# Patient Record
Sex: Male | Born: 1950 | Race: White | Hispanic: No | Smoking: Former smoker
Health system: Southern US, Community
[De-identification: ages and names within clinical notes are randomized; demographics above are authoritative.]

## PROBLEM LIST (undated history)

## (undated) DIAGNOSIS — I10 Essential (primary) hypertension: Secondary | ICD-10-CM

## (undated) DIAGNOSIS — I350 Nonrheumatic aortic (valve) stenosis: Secondary | ICD-10-CM

## (undated) DIAGNOSIS — I82629 Acute embolism and thrombosis of deep veins of unspecified upper extremity: Secondary | ICD-10-CM

## (undated) DIAGNOSIS — I471 Supraventricular tachycardia, unspecified: Secondary | ICD-10-CM

## (undated) DIAGNOSIS — U071 COVID-19: Secondary | ICD-10-CM

## (undated) DIAGNOSIS — N189 Chronic kidney disease, unspecified: Secondary | ICD-10-CM

## (undated) DIAGNOSIS — F0150 Vascular dementia without behavioral disturbance: Secondary | ICD-10-CM

## (undated) DIAGNOSIS — N400 Enlarged prostate without lower urinary tract symptoms: Secondary | ICD-10-CM

## (undated) DIAGNOSIS — I2510 Atherosclerotic heart disease of native coronary artery without angina pectoris: Secondary | ICD-10-CM

## (undated) DIAGNOSIS — Z972 Presence of dental prosthetic device (complete) (partial): Secondary | ICD-10-CM

## (undated) DIAGNOSIS — E785 Hyperlipidemia, unspecified: Secondary | ICD-10-CM

## (undated) DIAGNOSIS — N1832 Chronic kidney disease, stage 3b: Secondary | ICD-10-CM

## (undated) DIAGNOSIS — M1990 Unspecified osteoarthritis, unspecified site: Secondary | ICD-10-CM

## (undated) DIAGNOSIS — R0600 Dyspnea, unspecified: Secondary | ICD-10-CM

## (undated) DIAGNOSIS — I5020 Unspecified systolic (congestive) heart failure: Secondary | ICD-10-CM

## (undated) DIAGNOSIS — R42 Dizziness and giddiness: Secondary | ICD-10-CM

## (undated) DIAGNOSIS — T7840XA Allergy, unspecified, initial encounter: Secondary | ICD-10-CM

## (undated) DIAGNOSIS — N390 Urinary tract infection, site not specified: Secondary | ICD-10-CM

## (undated) DIAGNOSIS — I509 Heart failure, unspecified: Secondary | ICD-10-CM

## (undated) DIAGNOSIS — I219 Acute myocardial infarction, unspecified: Secondary | ICD-10-CM

## (undated) HISTORY — PX: CORONARY ARTERY BYPASS GRAFT: SHX141

## (undated) HISTORY — DX: Hyperlipidemia, unspecified: E78.5

## (undated) HISTORY — DX: Essential (primary) hypertension: I10

## (undated) HISTORY — DX: Acute myocardial infarction, unspecified: I21.9

## (undated) HISTORY — PX: JOINT REPLACEMENT: SHX530

## (undated) HISTORY — DX: Dyspnea, unspecified: R06.00

## (undated) HISTORY — DX: Allergy, unspecified, initial encounter: T78.40XA

## (undated) HISTORY — DX: Heart failure, unspecified: I50.9

---

## 1999-09-05 ENCOUNTER — Emergency Department (HOSPITAL_COMMUNITY): Admission: EM | Admit: 1999-09-05 | Discharge: 1999-09-05 | Payer: Self-pay | Admitting: Emergency Medicine

## 2002-06-10 DIAGNOSIS — I219 Acute myocardial infarction, unspecified: Secondary | ICD-10-CM

## 2002-06-10 HISTORY — DX: Acute myocardial infarction, unspecified: I21.9

## 2003-02-21 ENCOUNTER — Encounter: Payer: Self-pay | Admitting: Emergency Medicine

## 2003-02-22 ENCOUNTER — Inpatient Hospital Stay (HOSPITAL_COMMUNITY): Admission: EM | Admit: 2003-02-22 | Discharge: 2003-02-25 | Payer: Self-pay | Admitting: Emergency Medicine

## 2003-03-21 ENCOUNTER — Encounter (HOSPITAL_COMMUNITY): Admission: RE | Admit: 2003-03-21 | Discharge: 2003-06-19 | Payer: Self-pay | Admitting: Cardiology

## 2007-06-11 HISTORY — PX: CORONARY ARTERY BYPASS GRAFT: SHX141

## 2007-07-16 ENCOUNTER — Ambulatory Visit: Payer: Self-pay | Admitting: Cardiology

## 2007-07-16 LAB — CONVERTED CEMR LAB
Basophils Absolute: 0 10*3/uL (ref 0.0–0.1)
Chloride: 101 meq/L (ref 96–112)
Creatinine, Ser: 0.9 mg/dL (ref 0.4–1.5)
Eosinophils Relative: 5.1 % — ABNORMAL HIGH (ref 0.0–5.0)
HCT: 45.1 % (ref 39.0–52.0)
INR: 0.9 (ref 0.8–1.0)
MCHC: 34 g/dL (ref 30.0–36.0)
Neutrophils Relative %: 49.6 % (ref 43.0–77.0)
RBC: 5.16 M/uL (ref 4.22–5.81)
RDW: 12.5 % (ref 11.5–14.6)
Sodium: 143 meq/L (ref 135–145)
WBC: 5.2 10*3/uL (ref 4.5–10.5)
aPTT: 29.1 s (ref 21.7–29.8)

## 2007-07-17 ENCOUNTER — Ambulatory Visit: Payer: Self-pay | Admitting: Cardiology

## 2007-07-17 ENCOUNTER — Encounter: Payer: Self-pay | Admitting: Cardiology

## 2007-07-17 ENCOUNTER — Inpatient Hospital Stay (HOSPITAL_BASED_OUTPATIENT_CLINIC_OR_DEPARTMENT_OTHER): Admission: RE | Admit: 2007-07-17 | Discharge: 2007-07-17 | Payer: Self-pay | Admitting: Cardiology

## 2007-07-17 ENCOUNTER — Ambulatory Visit (HOSPITAL_COMMUNITY): Admission: RE | Admit: 2007-07-17 | Discharge: 2007-07-17 | Payer: Self-pay | Admitting: Cardiology

## 2007-07-21 ENCOUNTER — Ambulatory Visit: Payer: Self-pay | Admitting: Surgery

## 2007-07-29 ENCOUNTER — Inpatient Hospital Stay (HOSPITAL_COMMUNITY): Admission: RE | Admit: 2007-07-29 | Discharge: 2007-08-03 | Payer: Self-pay | Admitting: Surgery

## 2007-07-30 ENCOUNTER — Ambulatory Visit: Payer: Self-pay | Admitting: Surgery

## 2007-08-14 ENCOUNTER — Ambulatory Visit (HOSPITAL_COMMUNITY): Admission: RE | Admit: 2007-08-14 | Discharge: 2007-08-14 | Payer: Self-pay | Admitting: Cardiology

## 2007-08-25 ENCOUNTER — Ambulatory Visit: Payer: Self-pay | Admitting: Surgery

## 2007-08-25 ENCOUNTER — Encounter: Admission: RE | Admit: 2007-08-25 | Discharge: 2007-08-25 | Payer: Self-pay | Admitting: Surgery

## 2007-09-02 ENCOUNTER — Ambulatory Visit: Payer: Self-pay | Admitting: Cardiology

## 2007-09-10 ENCOUNTER — Encounter (HOSPITAL_COMMUNITY): Admission: RE | Admit: 2007-09-10 | Discharge: 2007-12-09 | Payer: Self-pay | Admitting: Cardiology

## 2007-10-23 ENCOUNTER — Ambulatory Visit: Payer: Self-pay | Admitting: Cardiology

## 2007-12-09 ENCOUNTER — Ambulatory Visit: Payer: Self-pay | Admitting: Cardiology

## 2008-09-20 ENCOUNTER — Telehealth (INDEPENDENT_AMBULATORY_CARE_PROVIDER_SITE_OTHER): Payer: Self-pay | Admitting: *Deleted

## 2008-11-24 DIAGNOSIS — I5043 Acute on chronic combined systolic (congestive) and diastolic (congestive) heart failure: Secondary | ICD-10-CM | POA: Insufficient documentation

## 2008-11-24 DIAGNOSIS — I10 Essential (primary) hypertension: Secondary | ICD-10-CM | POA: Insufficient documentation

## 2008-11-24 DIAGNOSIS — I509 Heart failure, unspecified: Secondary | ICD-10-CM | POA: Insufficient documentation

## 2008-11-24 DIAGNOSIS — R0602 Shortness of breath: Secondary | ICD-10-CM

## 2008-11-24 DIAGNOSIS — I5042 Chronic combined systolic (congestive) and diastolic (congestive) heart failure: Secondary | ICD-10-CM | POA: Insufficient documentation

## 2008-11-24 HISTORY — DX: Acute on chronic combined systolic (congestive) and diastolic (congestive) heart failure: I50.43

## 2008-11-24 HISTORY — DX: Shortness of breath: R06.02

## 2008-11-25 ENCOUNTER — Ambulatory Visit: Payer: Self-pay | Admitting: Cardiology

## 2009-06-02 ENCOUNTER — Ambulatory Visit: Payer: Self-pay | Admitting: Cardiology

## 2009-06-02 ENCOUNTER — Observation Stay (HOSPITAL_COMMUNITY): Admission: EM | Admit: 2009-06-02 | Discharge: 2009-06-03 | Payer: Self-pay | Admitting: Emergency Medicine

## 2009-06-02 ENCOUNTER — Encounter (INDEPENDENT_AMBULATORY_CARE_PROVIDER_SITE_OTHER): Payer: Self-pay | Admitting: *Deleted

## 2009-06-02 LAB — CONVERTED CEMR LAB
CO2: 25 meq/L
Chloride: 103 meq/L
Creatinine, Ser: 1.54 mg/dL
Potassium: 4.1 meq/L

## 2009-06-03 ENCOUNTER — Encounter (INDEPENDENT_AMBULATORY_CARE_PROVIDER_SITE_OTHER): Payer: Self-pay | Admitting: *Deleted

## 2009-06-03 LAB — CONVERTED CEMR LAB
ALT: 35 units/L
Albumin: 2.9 g/dL
CO2: 24 meq/L
Calcium: 8.1 mg/dL
Chloride: 107 meq/L
Glucose, Bld: 187 mg/dL
Potassium: 3.2 meq/L
Sodium: 138 meq/L
Total Protein: 5 g/dL

## 2009-06-23 ENCOUNTER — Encounter (INDEPENDENT_AMBULATORY_CARE_PROVIDER_SITE_OTHER): Payer: Self-pay | Admitting: *Deleted

## 2009-06-27 ENCOUNTER — Ambulatory Visit: Payer: Self-pay | Admitting: Cardiology

## 2009-06-27 ENCOUNTER — Encounter: Payer: Self-pay | Admitting: Adult Health

## 2009-06-27 DIAGNOSIS — E119 Type 2 diabetes mellitus without complications: Secondary | ICD-10-CM | POA: Insufficient documentation

## 2009-06-27 DIAGNOSIS — E1165 Type 2 diabetes mellitus with hyperglycemia: Secondary | ICD-10-CM | POA: Insufficient documentation

## 2009-06-27 DIAGNOSIS — I25810 Atherosclerosis of coronary artery bypass graft(s) without angina pectoris: Secondary | ICD-10-CM | POA: Insufficient documentation

## 2009-10-12 ENCOUNTER — Telehealth: Payer: Self-pay | Admitting: Cardiology

## 2009-10-18 ENCOUNTER — Encounter: Payer: Self-pay | Admitting: Adult Health

## 2009-11-12 IMAGING — CR DG CHEST 2V
2 series · 2 of 2 positions shown · non-contrast
Comparison: none

CLINICAL DATA: Preadmission film, coronary artery. 
 CHEST - 2 VIEW:

[view not recorded (1 of 2)]
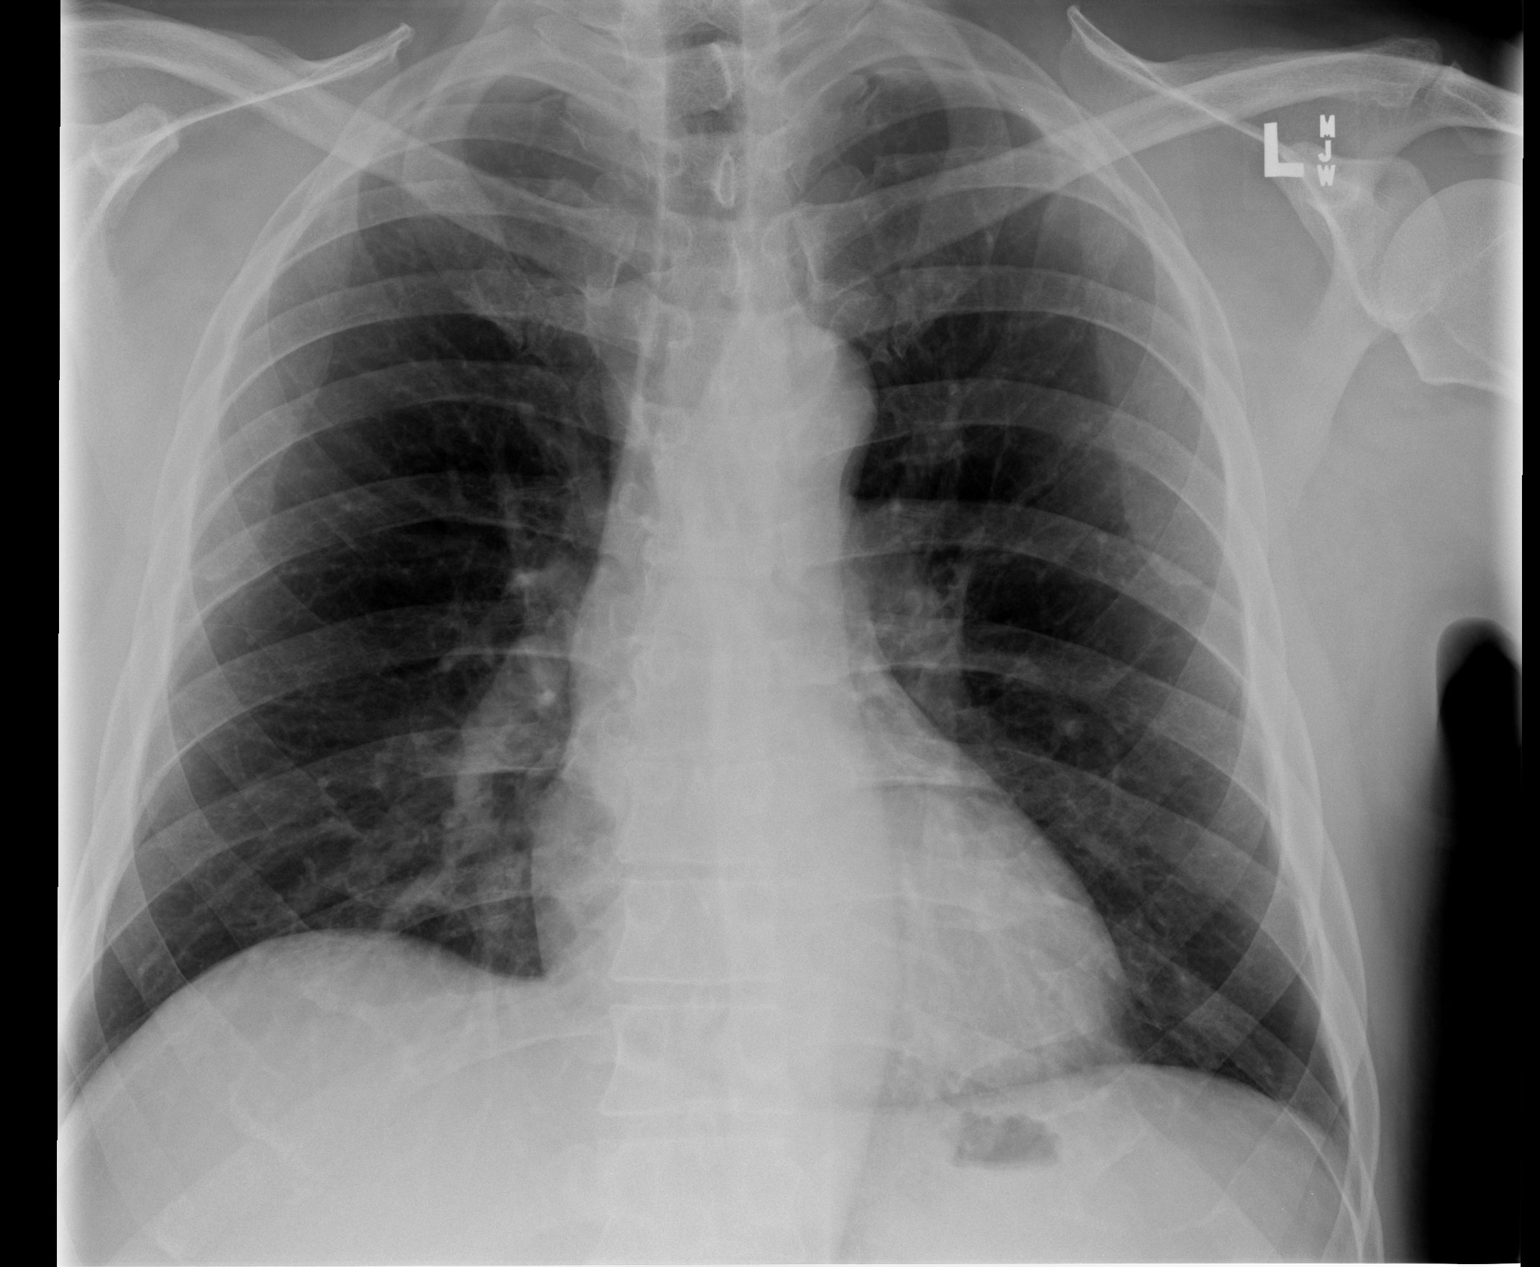

[view not recorded (2 of 2)]
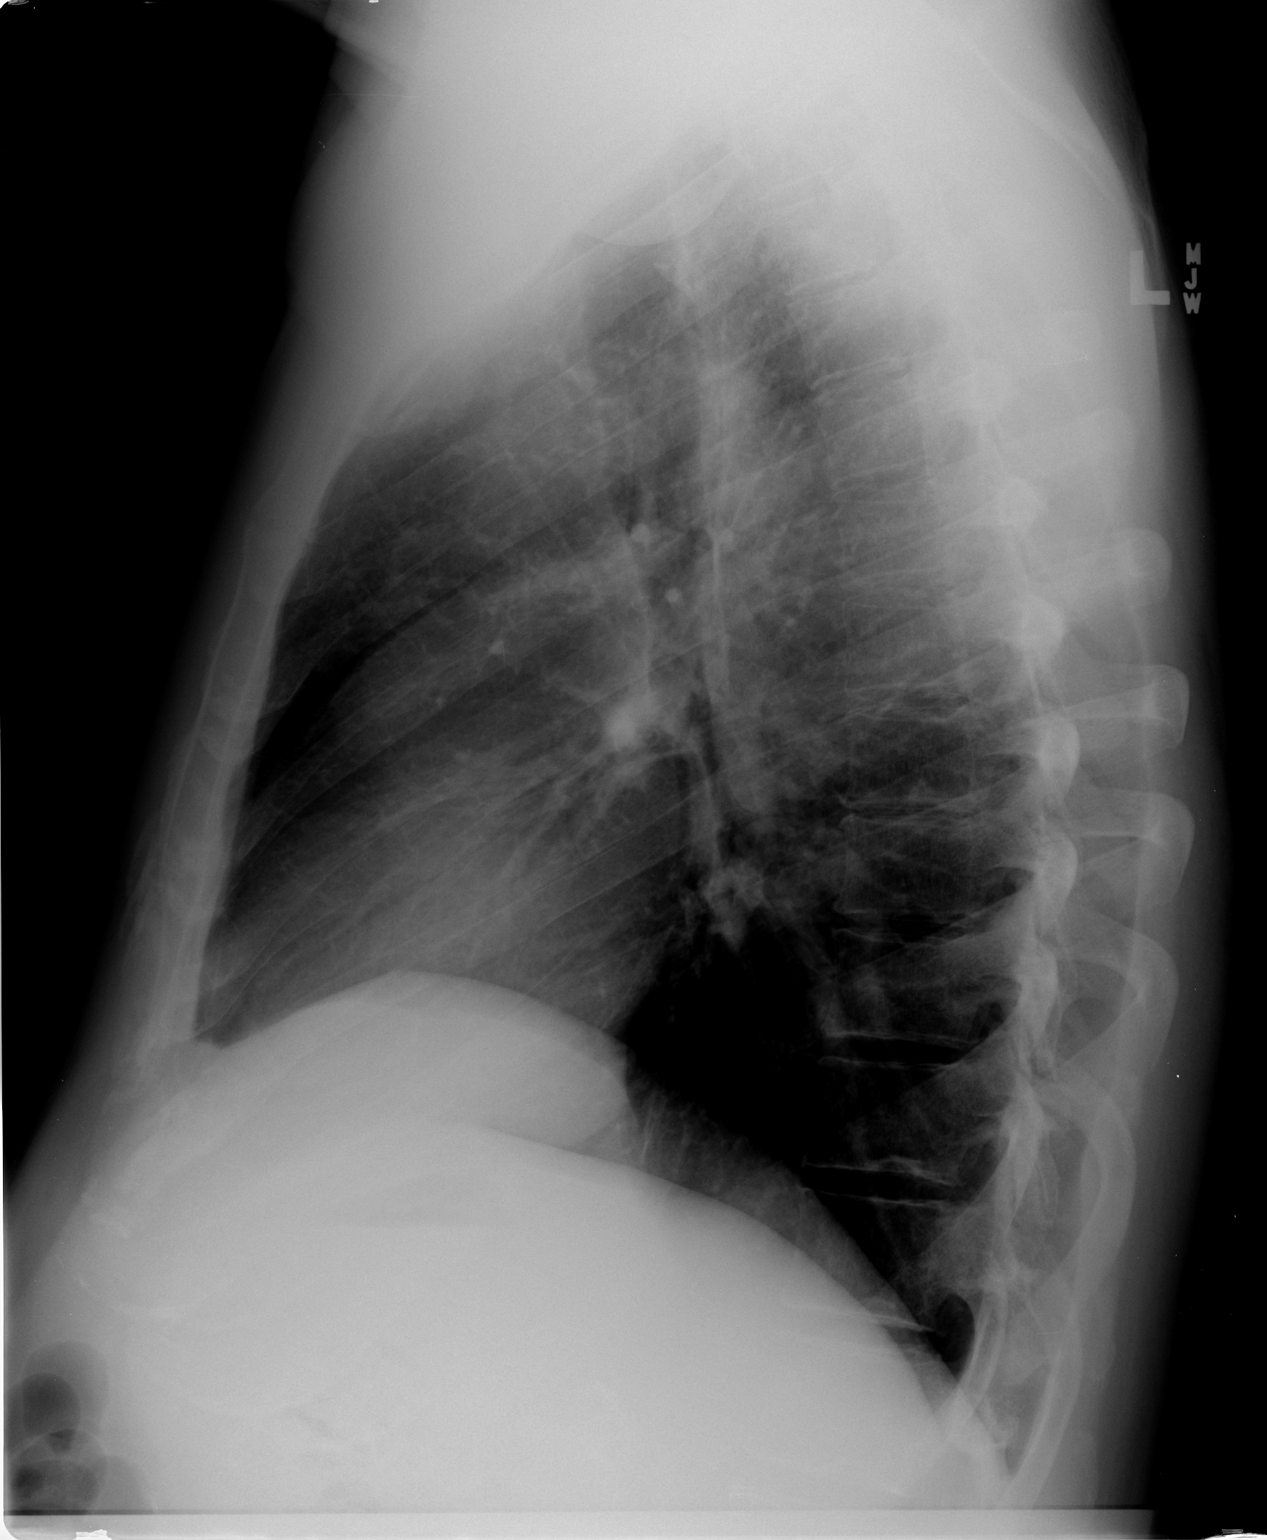

[2 of 2 positions shown; findings below may reference images not displayed]

FINDINGS: The lungs are clear.  Heart is normal.  Mediastinum and hila are negative for adenopathy.
IMPRESSION: Negative for acute cardiac or pulmonary process.

## 2009-11-16 IMAGING — CR DG CHEST 2V
2 series · 2 of 2 positions shown · non-contrast
Comparison: Comparison is made with chest single view 07/30/07.

CLINICAL DATA: 56-year-old male with CABG.
 CHEST - 2 VIEW:

[w chest pa]
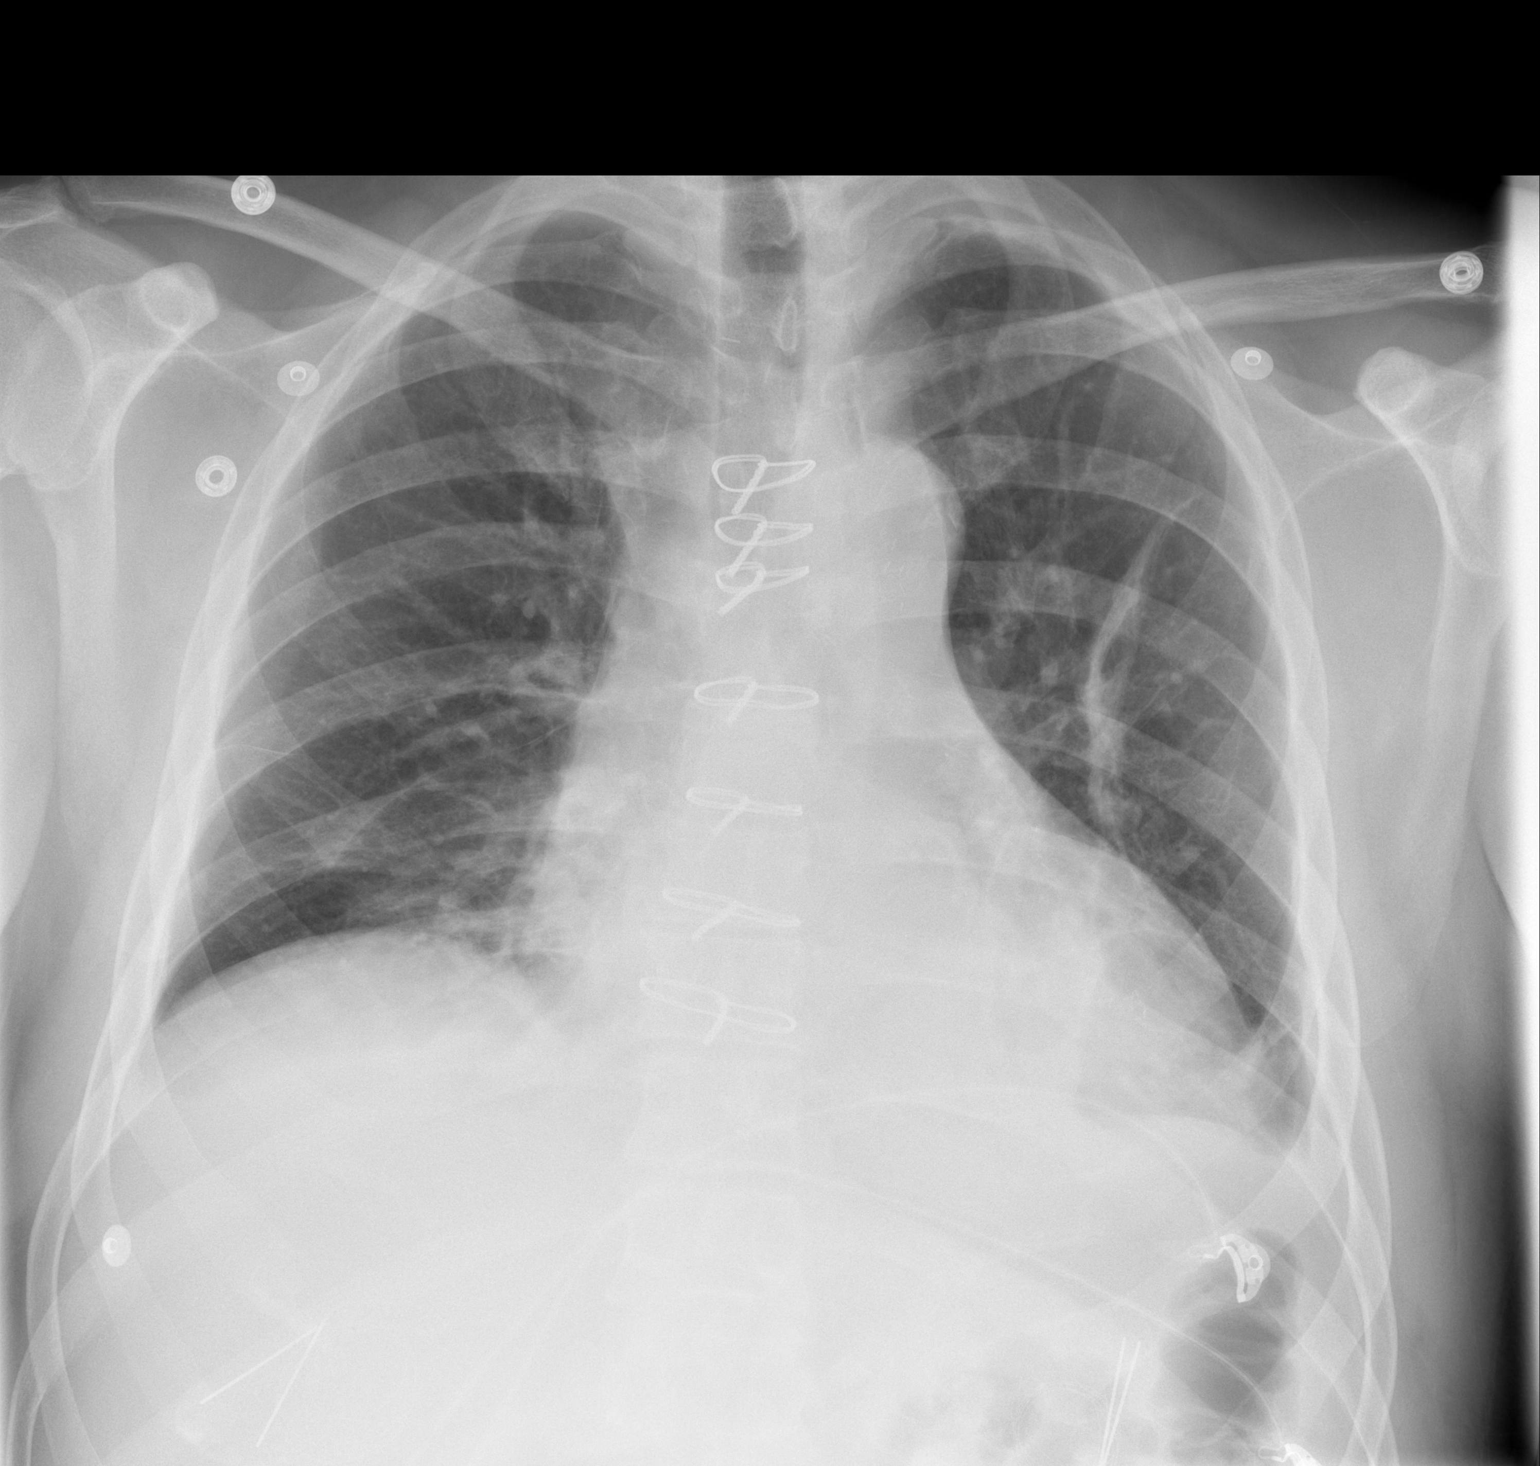

[w chest lat]
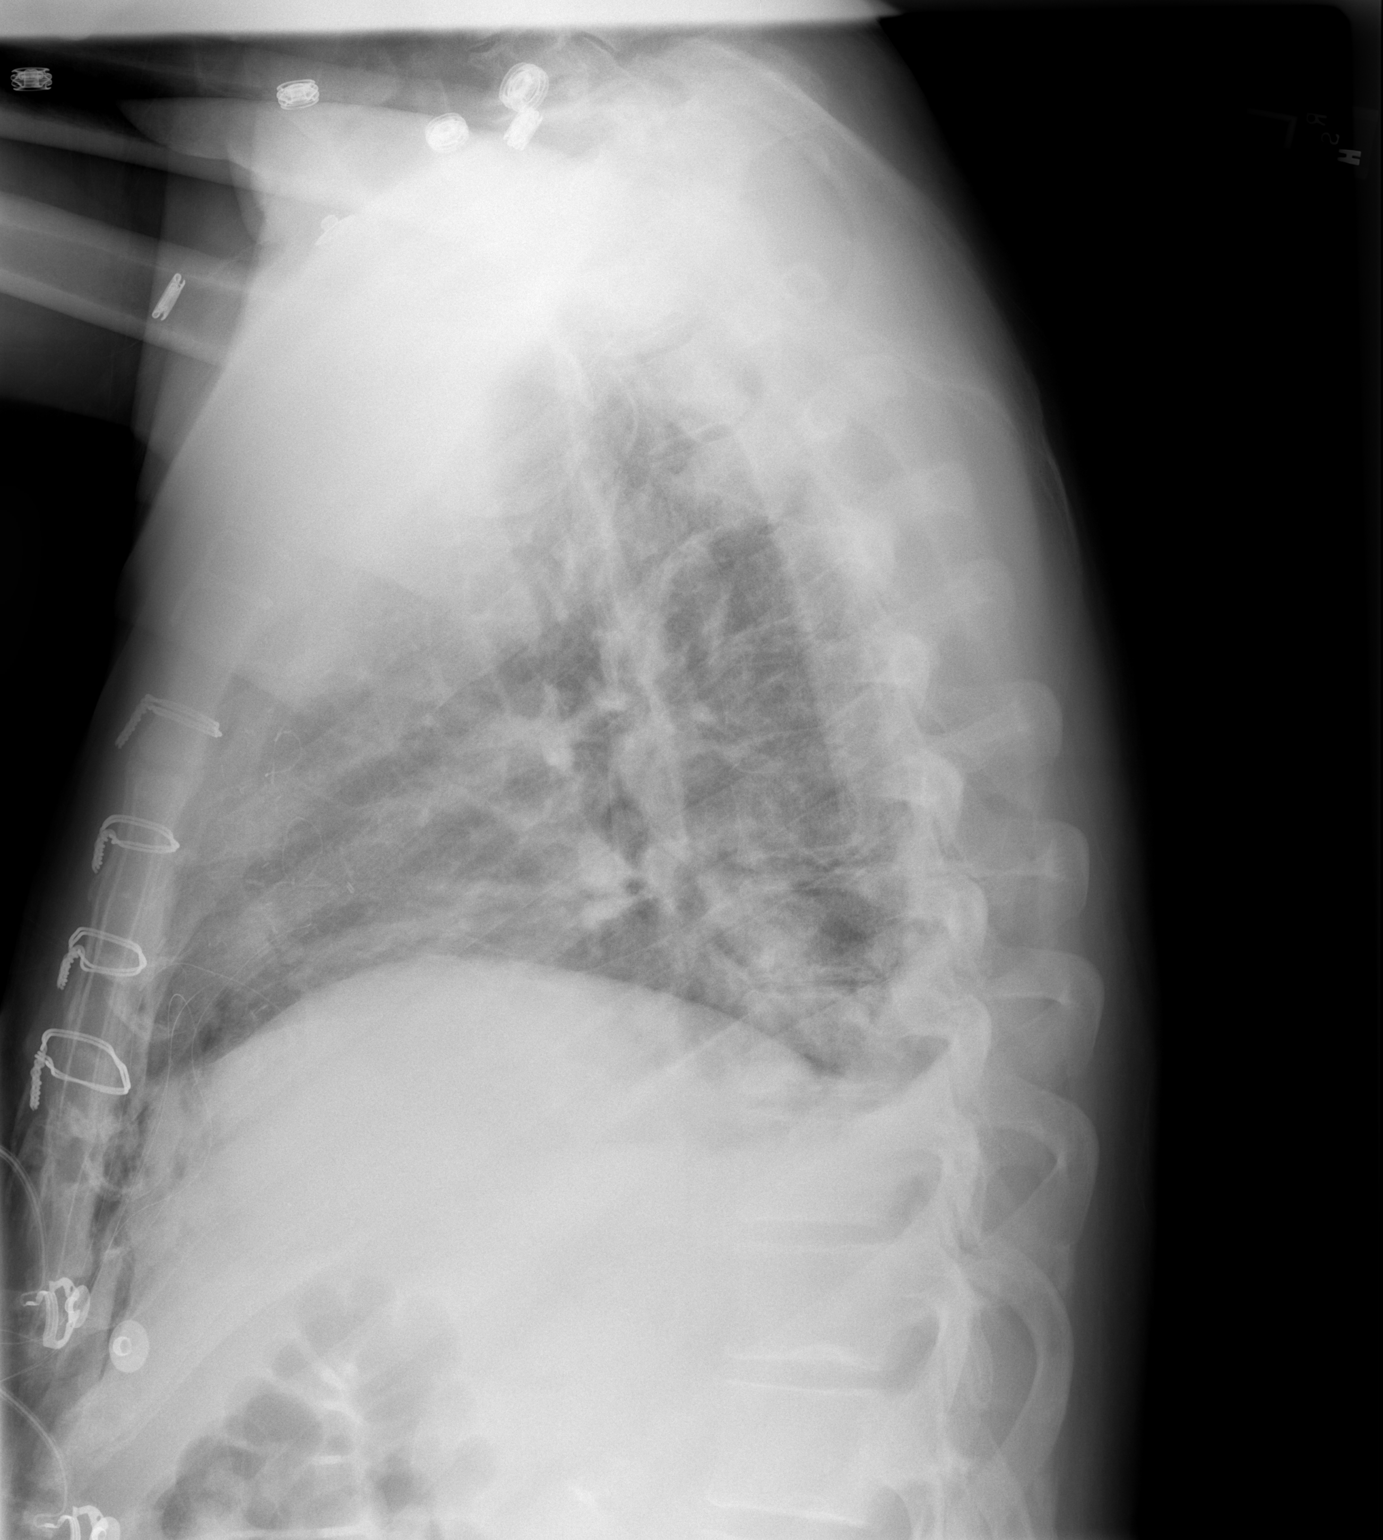

[2 of 2 positions shown; findings below may reference images not displayed]

FINDINGS: Sternotomy wires overlie enlarged cardiac silhouette. Interval removal of left chest tube with no evidence of pneumothorax. There is new subsegmental atelectasis in the left mid lung. Interval retraction of Swan-Ganz catheter and sheath.
IMPRESSION: 1.  Interval removal of left chest tube without pneumothorax.
 2.  New subsegmental atelectasis in the left mid lung. 
 3.  Persistent bibasilar atelectasis, left greater than right.

## 2010-03-28 ENCOUNTER — Ambulatory Visit (HOSPITAL_BASED_OUTPATIENT_CLINIC_OR_DEPARTMENT_OTHER): Admission: RE | Admit: 2010-03-28 | Discharge: 2010-03-28 | Payer: Self-pay | Admitting: Orthopedic Surgery

## 2010-05-07 ENCOUNTER — Encounter (INDEPENDENT_AMBULATORY_CARE_PROVIDER_SITE_OTHER): Payer: Self-pay | Admitting: *Deleted

## 2010-07-10 NOTE — Progress Notes (Signed)
  Phone Note Call from Patient   Caller: Patient Call For: insurance coverage Summary of Call: This pt switching from [employer] to another company.  He requests a letter that addresses his all his cardiac issues past and present.  Please include past cardiac surgical procedures.  Thank you for help this nice gentleman get cheaper better insurance. Initial call taken by: Teressa Lower RN,  Oct 12, 2009 4:54 PM  Follow-up for Phone Call        Ask KL to do since she just saw him in the office. I will gladly sign Friday. Follow-up by: Gaylord Shih, MD, [facility],  Oct 17, 2009 12:50 PM     Appended Document:  Letter written today and [name] will have available to be signed.  KL

## 2010-07-10 NOTE — Assessment & Plan Note (Signed)
Summary: eph post stent/tmj   Visit Type:  Follow-up Primary Provider:  urgent care   History of Present Illness: [name] is a 60 y/o CM who we are seeing on follow-up s/p hospitalization December 2010.  He was admitted with chest pain and was ruled out for MI.  It was felt to be GI in origin.  He has a history of CAD, with CABG 2/09 (LIMA - LAD,SVG to Diag-SVG to OM, SVG to PDA) .  He complained of weakness, mild discomfort in the abdomen and chest.  He was discharged without changes in his medications.  He was found to be hyperglycemic and hypokalemic.  His electrolytes were corrected and he began to feel better. Apparently he had been out of his metformin for several days prior to admission.  Since discharge, he has felt well, takes his medications as directed, continues his metformin as he has been able to get refills.  No complaints of chest pain, SOB, or fatigue.  No GI disturbances as well.  Current Problems (verified): 1)  Hypertension  (ICD-401.9) 2)  CHF  (ICD-428.0) 3)  Dyspnea  (ICD-786.05)  Current Medications (verified): 1)  Cozaar 50 Mg Tabs (Losartan Potassium) .... Take 1 Tablet By Mouth Once A Day 2)  Triamcinolone Acetonide 0.1 % Oint (Triamcinolone Acetonide) .... Apply As Directed 3)  Lipitor 80 Mg Tabs (Atorvastatin Calcium) .Marland Kitchen.. 1 Tab Once Daily 4)  Amlodipine Besylate 10 Mg Tabs (Amlodipine Besylate) .... Take One Tablet By Mouth Daily 5)  Flomax 0.4 Mg Caps (Tamsulosin Hcl) .... Take 1 Tab Daily 6)  Ecotrin 325 Mg Tbec (Aspirin) .... Take 1 Tab Daily 7)  Metformin Hcl 1000 Mg Tabs (Metformin Hcl) .... Bid  Allergies (verified): 1)  ! * Sulfar PMH-FH-SH reviewed-no changes except otherwise noted  Review of Systems       All other systems have been reviewed and are negative unless stated above.   Vital Signs:  Patient profile:   60 year old male Height:      73 inches Weight:      227 pounds BMI:     30.06 Pulse rate:   89 / minute BP sitting:    123 / 75  (right arm)  Vitals Entered By: Dreama Saa, CNA (June 27, 2009 1:40 PM)  Physical Exam  General:  Well developed, well nourished, in no acute distress. Lungs:  Clear bilaterally to auscultation and percussion. Heart:  Non-displaced PMI, chest non-tender; regular rate and rhythm, S1, S2 without murmurs, rubs or gallops. Carotid upstroke normal, no bruit. Normal abdominal aortic size, no bruits. Femorals normal pulses, no bruits. Pedals normal pulses. No edema, no varicosities. Abdomen:  Bowel sounds positive; abdomen soft and non-tender without masses, organomegaly, or hernias noted. No hepatosplenomegaly. Msk:  Back normal, normal gait. Muscle strength and tone normal. Pulses:  pulses normal in all 4 extremities Extremities:  No clubbing or cyanosis. Psych:  Normal affect.   Impression & Recommendations:  Problem # 1:  CORONARY ATHEROSCLEROSIS, ARTERY BYPASS GRAFT (ICD-414.04) He is stable from cardiac standpoint.  No complaints at this time. Recent hospitalization was related to GI symptoms and hyperglycemia.  We will continue to follow him.  He knows to see [place] sooner should he become symptomatic. His updated medication list for this problem includes:    Amlodipine Besylate 10 Mg Tabs (Amlodipine besylate) .Marland Kitchen... Take one tablet by mouth daily    Ecotrin 325 Mg Tbec (Aspirin) .Marland Kitchen... Take 1 tab daily  Problem # 2:  DM (  ICD-250.00) I have referred him to Dr. Romero Belling.  Unfortunately, Dr. Everardo All is not accepting new patients unless referred by a primary care physician.  We will find a physican who is accepting new patients so that he can be established there.  He usually goes to an urgent care facility and will need to be established with primary for ongoing mgt. His updated medication list for this problem includes:    Cozaar 50 Mg Tabs (Losartan potassium) .Marland Kitchen... Take 1 tablet by mouth once a day    Ecotrin 325 Mg Tbec (Aspirin) .Marland Kitchen... Take 1 tab daily    Metformin Hcl  1000 Mg Tabs (Metformin hcl) ..... Bid  Patient Instructions: 1)  Your physician recommends that you schedule a follow-up appointment in: 6 months 2)  Your physician recommends that you continue on your current medications as directed. Please refer to the Current Medication list given to you today.

## 2010-07-10 NOTE — Letter (Signed)
Summary: Appointment - Reminder 2  [facility] [place]. [address], [place] [address]   Phone: [phone]  Fax: [phone]     May 07, 2010 MRN: [id number]   NICOLES SEDLACEK [address] [place], [place]  [address]   Dear Mr. Carl,  Our records indicate that it is time to schedule a follow-up appointment.  Dr.   Daleen Squibb       recommended that you follow up with [place] in    12/2009 PAST DUE        . It is very important that we reach you to schedule this appointment. We look forward to participating in your health care needs. Please contact us at the number listed above at your earliest convenience to schedule your appointment.  If you are unable to make an appointment at this time, give [place] a call so we can update our records.     Sincerely,   Glass blower/designer

## 2010-07-10 NOTE — Miscellaneous (Signed)
Summary: HOSP LABS  Clinical Lists Changes  Observations: Added new observation of CALCIUM: 8.1 mg/dL (54/02/8118 14:78) Added new observation of ALBUMIN: 2.9 g/dL (29/56/2130 86:57) Added new observation of PROTEIN, TOT: 5.0 g/dL (84/69/6295 28:41) Added new observation of SGPT (ALT): 35 units/L (06/03/2009 10:54) Added new observation of SGOT (AST): 30 units/L (06/03/2009 10:54) Added new observation of ALK PHOS: 50 units/L (06/03/2009 10:54) Added new observation of GFR AA: >60 mL/min/1.77m2 (06/03/2009 10:54) Added new observation of GFR: >60 mL/min (06/03/2009 10:54) Added new observation of CREATININE: 1.05 mg/dL (32/44/0102 72:53) Added new observation of BUN: 19 mg/dL (66/44/0347 42:59) Added new observation of BG RANDOM: 187 mg/dL (56/38/7564 33:29) Added new observation of CO2 PLSM/SER: 24 meq/L (06/03/2009 10:54) Added new observation of CL SERUM: 107 meq/L (06/03/2009 10:54) Added new observation of K SERUM: 3.2 meq/L (06/03/2009 10:54) Added new observation of NA: 138 meq/L (06/03/2009 10:54) Added new observation of CALCIUM: 8.2 mg/dL (51/88/4166 06:30) Added new observation of GFR AA: 56 mL/min/1.82m2 (06/02/2009 10:54) Added new observation of GFR: 46 mL/min (06/02/2009 10:54) Added new observation of CREATININE: 1.54 mg/dL (16/06/930 35:57) Added new observation of BUN: 26 mg/dL (32/20/2542 70:62) Added new observation of BG RANDOM: 169 mg/dL (37/62/8315 17:61) Added new observation of CO2 PLSM/SER: 25 meq/L (06/02/2009 10:54) Added new observation of CL SERUM: 103 meq/L (06/02/2009 10:54) Added new observation of K SERUM: 4.1 meq/L (06/02/2009 10:54) Added new observation of NA: 137 meq/L (06/02/2009 10:54)

## 2010-07-10 NOTE — Letter (Signed)
Summary: Insurance Letter  To Whom It May Concern:   This letter is to discuss cardiology diagnosis and treatments of Shane Holloway.  He is a known patient of [facility], and is under treatment for:  1. Chest pain.  The patient ruled out for myocardial infarction by       serial cardiac markers and 12-lead EKG in December of 2010.  2. Hypertension.   3. History of coronary artery disease, status post coronary artery       bypass grafting in February 2009, with alignment to the left       anterior descending artery, saphenous vein graft to the diagonal,       saphenous vein graft to the obtuse marginal, saphenous vein graft       to the posterior descending artery.  Ejection fraction at that time       was 60%.  4. Hyperlipidemia.  He had a follow-up appoitment in January of 2011 and was stable from a cardiac standpoint with normal vital signs, and no cardiac conplaints at that time.  No medication changes were made.  He is to follow-up with [place] in 6 months time.  Please feel free to call or contact our office for more information if necessary.       Joni Reining NP A.A.C.C. [facility]      Valera Castle M.D. Lenise Arena. C.C [facility]  Appended Document: Insurance Letter  Reviewed [name], MD  Appended Document: Insurance Letter letter faxed to [name] office per his instructions

## 2010-08-22 LAB — GLUCOSE, CAPILLARY: Glucose-Capillary: 179 mg/dL — ABNORMAL HIGH (ref 70–99)

## 2010-08-23 LAB — BASIC METABOLIC PANEL
BUN: 15 mg/dL (ref 6–23)
CO2: 29 mEq/L (ref 19–32)
Chloride: 106 mEq/L (ref 96–112)
Creatinine, Ser: 0.85 mg/dL (ref 0.4–1.5)
Potassium: 4.1 mEq/L (ref 3.5–5.1)

## 2010-09-10 LAB — CBC
MCHC: 34.8 g/dL (ref 30.0–36.0)
RBC: 4.9 MIL/uL (ref 4.22–5.81)

## 2010-09-10 LAB — COMPREHENSIVE METABOLIC PANEL
BUN: 19 mg/dL (ref 6–23)
CO2: 24 mEq/L (ref 19–32)
Chloride: 107 mEq/L (ref 96–112)
Creatinine, Ser: 1.05 mg/dL (ref 0.4–1.5)
GFR calc non Af Amer: >60 mL/min (ref >60)
Total Bilirubin: 1.4 mg/dL — ABNORMAL HIGH (ref 0.3–1.2)

## 2010-09-10 LAB — DIFFERENTIAL
Basophils Relative: 0 % (ref 0–1)
Lymphocytes Relative: 7 % — ABNORMAL LOW (ref 12–46)
Monocytes Relative: 7 % (ref 3–12)
Neutro Abs: 3.8 10*3/uL (ref 1.7–7.7)
Neutrophils Relative %: 86 % — ABNORMAL HIGH (ref 43–77)

## 2010-09-10 LAB — BASIC METABOLIC PANEL
BUN: 26 mg/dL — ABNORMAL HIGH (ref 6–23)
Calcium: 8.2 mg/dL — ABNORMAL LOW (ref 8.4–10.5)
GFR calc non Af Amer: 46 mL/min — ABNORMAL LOW (ref >60)
Glucose, Bld: 169 mg/dL — ABNORMAL HIGH (ref 70–99)
Sodium: 137 mEq/L (ref 135–145)

## 2010-09-10 LAB — POCT CARDIAC MARKERS
CKMB, poc: <1 ng/mL — ABNORMAL LOW (ref 1.0–8.0)
CKMB, poc: <1 ng/mL — ABNORMAL LOW (ref 1.0–8.0)
Myoglobin, poc: 85.1 ng/mL (ref 12–200)
Troponin i, poc: <0.05 ng/mL (ref 0.00–0.09)

## 2010-09-10 LAB — CK TOTAL AND CKMB (NOT AT ARMC): CK, MB: 1.9 ng/mL (ref 0.3–4.0)

## 2010-09-10 LAB — GLUCOSE, CAPILLARY: Glucose-Capillary: 225 mg/dL — ABNORMAL HIGH (ref 70–99)

## 2010-09-10 LAB — CARDIAC PANEL(CRET KIN+CKTOT+MB+TROPI): Troponin I: 0.01 ng/mL (ref 0.00–0.06)

## 2010-09-10 LAB — TROPONIN I: Troponin I: 0.02 ng/mL (ref 0.00–0.06)

## 2010-10-23 NOTE — Assessment & Plan Note (Signed)
[facility]                            CARDIOLOGY OFFICE NOTE   Shane, Holloway                    MRN:          [id number]  DATE:07/16/2007                            DOB:          [birth year]    NOTE:  Shane Holloway comes in as an add on today from Dr. Earl Holloway at  [facility].   HISTORY OF PRESENT ILLNESS:  He is 60 years of age, white male who has  known coronary disease.  He was last seen by [place] in 2004 after he  presented with a non-Q-wave infarction.  He had a diagonal stent and a  cutting balloon performed and then he came back for a tight mid right  coronary lesion.   He saw Dr. Geralynn Holloway last September 2004.  Specifically he had a drug  eluting stent in the mid right coronary artery, a drug eluting stent in  the ostium of the diagonal and a cutting balloon of the diagonal.   Over the last week or so he has been having exertional shortness of  breath and chest tightness.  He has had no nocturnal or rest pain.  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   PAST MEDICAL HISTORY:  His risk factors are hypertension,  hyperlipidemia, type 2 diabetes.  He seems to be compliant with his  medications.   CURRENT MEDICATIONS:  1. His current meds are atenolol 100 mg a day.  2. Cyclobenzaprine 10 mg a day.  3. Flomax 0.4 mg a day.  4. HCTZ 12.5 mg a day.  5. Aspirin 325 mg a day.  6. Altace 10 mg a day.  7. Metformin 500 mg a day.   ALLERGIES:  He is intolerant SULFA drugs.   SOCIAL AND FAMILY HISTORY:  See previous HPIs.   REVIEW OF SYSTEMS:  No orthopnea, PND or peripheral edema.  He has had  no melena or hematochezia.  He has had no syncope, presyncope or  palpitations.   PHYSICAL EXAMINATION:  GENERAL:  He is a very pleasant gentleman.  VITAL SIGNS:  His blood pressure 154/101.  His pulse is 100 and regular.  His weight is 232, up 8.  HEENT:  Normocephalic, atraumatic.  PERRL.   Extraocular movements  intact.  Sclerae are clear.  Face symmetry normal.  Carotid upstrokes  were equal bilaterally without bruits, no JVD.  Thyroid is not enlarged.  Trachea is midline.  LUNGS:  Clear.  HEART:  Reveals a nondisplaced PMI.  Normal S1-S2.  ABDOMEN:  Soft, good bowel sounds.  No midline bruit.  No hepatomegaly.  EXTREMITIES:  No clubbing, cyanosis or edema.  Pulses are intact.  NEUROLOGICAL:  Exam is intact.   His electrocardiogram from today shows normal sinus rhythm with ST  segment flattening with T-wave inversion in aVL, V5 and V6.   Blood work that was obtained in October at [facility]  showed a triglyceride count of 721, cholesterol 209, hemoglobin A1c of  7.1%, positive microalbuminuria, blood sugar 132.   ASSESSMENT:  1. History of  coronary artery disease now with exertional angina.      Rule out recurrent obstructive disease.  2. Severe mixed hyperlipidemia.  3. Hypertension, question control.  4. Question of compliance.   PLAN:  Outpatient cardiac catheterization tomorrow.  Indications, risks  and potential benefits have been discussed with the patient.  He agrees  to proceed.  We have given him sublingual nitroglycerin and told him to  take it easy this evening.     Shane C. Daleen Squibb, MD, [facility]  Electronically Signed    TCW/MedQ  DD: 07/16/2007  DT: 07/17/2007  Job #: [id number]

## 2010-10-23 NOTE — Cardiovascular Report (Signed)
Shane Holloway, MCGLASSON             ACCOUNT NO.:  [id number]   MEDICAL RECORD NO.:  [id number]          PATIENT TYPE:  OIB   LOCATION:  NA                           FACILITY:  [facility]   PHYSICIAN:  Rollene Rotunda, MD, FACCDATE OF BIRTH:  05-31-1951   DATE OF PROCEDURE:  DATE OF DISCHARGE:                            CARDIAC CATHETERIZATION   PRIMARY CARE PHYSICIAN:  Brett Canales A. Cleta Alberts, M.D.   CARDIOLOGIST:  Jesse Sans. Wall, MD.   PROCEDURE:  Left heart catheterization/coronary arteriography.   INDICATIONS:  Evaluate patient with chest pain suggestive of unstable  angina.  He has had previous stenting to a diagonal and a right coronary  lesion.  He has had cutting balloon angioplasty to the ostial diagonal.   PROCEDURE NOTE:  Left heart catheterization was performed via the right  femoral artery.  The artery was cannulated using anterior wall puncture.  A number 6-French arterial sheath was inserted via the modified  Seldinger.  A 4-French arterial sheath was inserted via modified  Seldinger technique.  Preformed Judkins and pigtail catheter were  utilized.  The patient tolerated the procedure well and left the lab in  stable condition.   RESULTS:  Hemodynamics:  LV 173/16, AO 178/103.   Coronaries:  Left main was normal.  The LAD had proximal calcification.  There was proximal 60-70% stenosis before the first diagonal.  There was  a long 70-80% stenosis after the diagonal.  There was mid long 70-80%  stenosis.  First diagonal was large with ostial 70-80% stenosis.  There  was mid stent with luminal irregularities.  Circumflex in the AV groove  had luminal irregularities.  There was a mid obtuse marginal which was  large with ostial 70% stenosis.  Posterolateral was small and normal.  The right coronary artery is a dominant vessel.  There were diffuse 25-  30% lesions.  The stent in the mid segment was widely patent.  There was  50% stenosis before the PDA.  The PDA was large with ostial  60-70%  stenosis.  Posterolateral x2 were normal.   Left ventriculogram:  The left ventriculogram in the RAO projection  demonstrated an EF 60% with normal wall motion.   CONCLUSION:  Diffuse coronary artery disease.  Preserved ejection  fraction.   PLAN:  I will refer the patient for CABG.  I have reviewed the results  with Dr. Juanda Chance.  He agrees. The patient will be increased on his  atenolol 100 mg in the morning and 50 in the evening.  He will be given  isosorbide 20 mg t.i.d.  He has sublingual nitroglycerin.  He has been instructed on the use of  this should he have any chest pain.  If he has any significant episodes,  he should come to the emergency room.  We will get him to see the CVT  surgeons as an outpatient.      Rollene Rotunda, MD, [facility]  Electronically Signed     JH/MEDQ  D:  07/17/2007  T:  07/18/2007  Job:  [id number]   cc:   Brett Canales A. Cleta Alberts, M.D.  Thomas C. Wall, MD, [facility]

## 2010-10-23 NOTE — Assessment & Plan Note (Signed)
OFFICE VISIT   Shane Holloway  DOB:  09-06-1950                                        August 25, 2007  CHART #:  [id number]   HISTORY:  Mr. Delatte returned today for followup status post coronary  artery bypass grafting surgery x4 on July 29, 2007.  He has been  feeling well overall and is walking daily without chest pain or  shortness of breath.   PHYSICAL EXAMINATION:  VITAL SIGNS:  Blood pressure 132/82 and his pulse  is 82 and regular.  Respiratory rate is 18 and unlabored.  Oxygen  saturation on room air is 96%.  GENERAL:  He looks well.  CARDIAC:  Exam shows regular rate and rhythm with normal heart sounds.  LUNGS:  His lung exam is clear.  The chest incision is healing well and  the sternum is stable.  EXTREMITIES:  His leg incision is healing well and there is no lower  extremity edema.   Followup chest x-ray shows clear lung fields and no pleural effusion.   MEDICATIONS:  1. Glucophage 1,000 mg daily.  2. Aspirin 325 mg daily.  3. Atenolol 100 mg daily.  4. Cyclobenzaprine 10 mg daily.  5. Flomax 0.4 mg daily.  6. Cozaar 50 mg daily.   Overall, Shane Holloway is recovering well following his surgery.  I  encouraged him to continue walking as much as possible.  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  He has  a followup appointment to see Dr. Daleen Squibb on September 02, 2007 and will  contact me if he develops any problems with his incisions.   Evelene Croon, M.D.  Electronically Signed   BB/MEDQ  D:  08/25/2007  T:  08/26/2007  Job:  [id number]   cc:   Thomas C. Wall, MD, [facility]

## 2010-10-23 NOTE — Assessment & Plan Note (Signed)
[facility]                       [place] CARDIOLOGY OFFICE NOTE   Shane Holloway, Shane Holloway                      MRN:          [id number]  DATE:11/25/2008                            DOB:          [birth year]    Shane Holloway comes in today for followup.   PROBLEM LIST:  1. Coronary artery disease status post coronary artery bypass grafting      in February 2009.  2. Type 2 diabetes.  3. Mixed hyperlipidemia.  4. Hypertension.   He is having no angina or ischemic symptoms.  Denies any orthopnea, PND,  or peripheral edema.  He says his blood sugars up and down.   CURRENT MEDICATIONS:  1. Aspirin 325 mg a day.  2. Flomax 0.4 mg per day.  3. Cozaar 100 mg a day.  4. Lipitor 80 mg nightly.  5. Metformin 1000 mg daily.  6. Amlodipine 5 mg a day.  7. Metoprolol 100 mg b.i.d.  8. He carries sublingual nitroglycerin.   PHYSICAL EXAMINATION:  VITAL SIGNS:  His blood pressure is high at  162/90, he says it runs high at home, his pulse is 66 and regular, his  weight is 226, he is 6 feet 1 inch.  HEENT:  Normal.  NECK:  Supple.  Carotid upstrokes were equal bilaterally without bruits.  No JVD.  Thyroid is not enlarged.  Trachea is midline.  LUNGS:  Clear to auscultation and percussion.  HEART:  Regular rate and rhythm.  No gallop.  ABDOMEN:  Soft.  Good bowel sounds.  No midline bruit.  No hepatomegaly.  EXTREMITIES:  No cyanosis, clubbing, or edema.  Pulses are intact.  NEURO:  Intact.  SKIN:  No ecchymoses.   His electrocardiogram shows sinus rhythm with a first-degree AV block,  otherwise unchanged and normal.   ASSESSMENT AND PLAN:  Shane Holloway blood work needs to be checked.  We  will have him come back for fasting lipids and CMET.  In addition, I  have increased his amlodipine to 10 mg a  day.  I have told him to watch his blood pressure and also make sure  that he is following his blood sugar closely.  I am not sure he does  this.   Assuming he is doing well, I will see him back in February 2011.     Thomas C. Daleen Squibb, MD, [facility]  Electronically Signed    TCW/MedQ  DD: 11/25/2008  DT: 11/26/2008  Job #: [id number]

## 2010-10-23 NOTE — Assessment & Plan Note (Signed)
HEALTHCARE                            CARDIOLOGY OFFICE NOTE   BRIX, BREARLEY                    MRN:          [id number]  DATE:09/02/2007                            DOB:          21-Mar-1951    Mr. Barocio returns today after being discharged from the hospital for  bypass surgery.   I saw him initially on July 16, 2007 as an add-on, because of  exertional shortness of breath and chest tightness.  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   He had had previous coronary disease diagnosed in 2004 with a non-Q-wave  infarction.  He had a previous diagonal stent, a cutting balloon  performed and a mid-right coronary artery lesion in the past.   We recommended cardiac catheterization.  This shows severe three-vessel  coronary disease.  He has normal left ventricular systolic function.   He was admitted on the day of surgery by Dr. Laneta Simmers, who placed left  internal mammary graft to the LAD, a vein graft to a diagonal branch of  the LAD, a vein graft to an obtuse marginal branch of the circumflex,  vein graft to a posterior descending branch to the right.   He has a history of probable an ACE inhibitor-induced cough.  He was  changed from Altace to Cozaar in the past.   He really has neglected his health, not visiting the doctor, but eating  kind of what he wants to eat.  His hemoglobin A1c has been suboptimal.  His triglycerides has been as high as the 700s, in the past.   I think this certainly has gotten his attention, and we had a long talk  today about taking great care of himself.  Remarkably, he somehow is not  taking Lipitor anymore, and he had been on 80 mg in the past.   CURRENT MEDICATIONS:  His current meds are:  1. Metformin 500 mg p.o. daily.  2. Aspirin 325 mg a day.  3. Flomax 0.4 mg a day.  4. Atenolol 100 mg a day.  5. Cozaar 50 mg a day.   He has mild chest wall soreness.  He denies any orthopnea, PND,  tachy  palpitations, pre-syncope, syncope, abdominal pain, melena,  hematochezia, lower extremity edema, or pain.   He did have SVT that was captured with a right bundle morphology post-  op.   PHYSICAL EXAMINATION:  GENERAL:  He is very pleasant.  VITAL SIGNS:  His blood pressure is 142/82.  His pulse is 95 and  regular.  He says he has taken his medicines today.  He weighs 218,  which is actually down 14 pounds.  HEENT:  Normocephalic, atraumatic.  PERRLA.  Extraocular movements  intact.  Sclerae is slightly muddy, facial symmetry is normal.  Dentition satisfactory.  Carotid upstrokes are equal bilaterally without  bruits, no JVD.  Thyroid is not enlarged.  Trachea is midline.  LUNGS:  Clear to auscultation, even the left base.  His sternotomy site  has healed nicely with a very fine pinpoint or pin-line scar.  HEART:  His heart reveals  a nondisplaced PMI, normal S1-S2 without gallop or  rub, murmur.  ABDOMEN:  Soft, good bowel sounds.  No midline bruit.  No hepatomegaly.  EXTREMITIES:  No sinus clubbing or edema.  Pulses are intact.  NEURO:  Exam is intact.  SKIN:  Shows a few ecchymoses.   His electrocardiogram shows sinus rhythm with ST-segment depression in 1  and AVL and V6, nonspecific.  His PR, QRS, QTC are normal.   ASSESSMENT/PLAN:  Mr. Mohamed clearly needs to take much better care of  himself in the future.  His lipids, his blood sugar, and his blood  pressure all are sub-optimally controlled.  His medications clearly need  to be revamped.   I spent about 30 minutes talking him today about various issues going  forward.  I think he heard my message.   PLAN:  1. Increase Cozaar to 100 mg a day.  2. Begin Lipitor 80 mg q.h.s.  3. Tight blood sugar control and avoiding carbs and whites.  4. Return for fasting blood work including lipids, comprehensive      metabolic panel, hemoglobin A1c in 6 weeks.  5. See me back in 8 weeks to discuss findings of his blood work  and      followup on his blood pressure.     Thomas C. Daleen Squibb, MD, [facility]  Electronically Signed    TCW/MedQ  DD: 09/02/2007  DT: 09/03/2007  Job #: [id number]   cc:   [facility]  Brett Canales A. Cleta Alberts, M.D.

## 2010-10-23 NOTE — Consult Note (Signed)
NEW PATIENT CONSULTATION   Shane Holloway, Shane Holloway  DOB:  [birth year]                                        July 21, 2007  CHART #:  [id number]   DATE OF CONSULTATION:  07/21/2007   REFERRING PHYSICIAN:  Jesse Sans. Wall, MD, FACC   REASON FOR CONSULTATION:  Severe three-vessel coronary disease with  unstable angina.   CLINICAL HISTORY:  I was asked by Dr. Daleen Squibb to evaluate Shane Holloway for  consideration of coronary artery bypass graft surgery.  He is a 60-year-  old gentleman with a history of coronary disease status post non Q-wave  myocardial infarction in 2004 followed by stenting of a diagonal and mid  right coronary lesion.  He had a drug-eluting stent in the mid right  coronary artery as well as at the ostium of the diagonal with a cutting  balloon angioplasty of the diagonal.  He did well until the past few  weeks when he has noted return of exertional chest tightness and  shortness of breath.  He had repeat cardiac catheterization on  07/17/2007 which showed severe three-vessel coronary disease.  The left  main was normal.  The LAD had proximal calcification with long 70%  stenosis before the first diagonal branch.  There is a long 70% to 80%  stenosis after the diagonal branch.  There was also a long mid 70% to  80% stenosis before the second diagonal branch.  The first diagonal was  a large vessel with an ostial  70% to 80% stenosis.  The stented area  had luminal irregularities.  The left circumflex had a single marginal  branch that had 70% osteal stenosis.  The right coronary art was a  dominant vessel with diffuse 25% to 30% stenoses.  The stent in the  midportion was widely patent.  There was a 50% stenosis before the  posterior descending and about 60% to 70% osteal stenosis in the  posterior descending.  There are two posterolateral branches that had no  disease in them.  Left ventricular ejection fraction was 60% with normal  wall motion.   The patient was started on isosorbide 20 mg t.i.d. and  said that he has not had any chest pain since then but has had daily  headaches which had been pretty severe.  He had been taking BC powders  without relief.   REVIEW OF SYSTEMS:  GENERAL:  He denies any fever or chills.  He has had  no recent weight changes.  He has had fatigue for several months.  HEENT:  Eyes:  Negative.  ENT:  Negative.  ENDOCRINE:  He has adult  onset diabetes.  He denies hypothyroidism.  CARDIOVASCULAR:  As above, he has had exertional substernal chest  pressure relieved with rest.  He has not had any symptoms at rest.  He  has had exertional dyspnea.  He denies PND and orthopnea.  Denies  peripheral edema.  He had had no palpitations.  Respiratory:  Denies  cough and sputum production.  GI:  He denies nausea or vomiting.  He has  had no melena or bright-red blood per rectum.  GU:  He denies dysuria  and hematuria.  He does have a history of BPH.  VASCULAR:  He denies  claudication or phlebitis.  He has never had a DVT.  NEUROLOGIC:  He  denies any focal weakness or numbness.  He denies dizziness, no syncope.  He has had headaches since being put on isosorbide.  He has never had a  TIA or a stroke.  PSYCHIATRIC:  Negative.  HEMATOLOGICAL:  Negative.   ALLERGIES:  HE IS INTOLERANT TO SULFA DRUGS.   MEDICATIONS:  1. Metformin 1000 mg daily.  2. Altace 10 mg daily.  3. Aspirin 325 mg daily.  4. HCTZ 12.5 mg daily.  5. Atenolol 100 mg q a.m.  6. Cyclobenzaprine 10 mg daily.  7. Flomax 0.4 mg daily.  8. Isosorbide 20 mg t.i.d.   PAST MEDICAL HISTORY:  Significant for hypertension and hyperlipidemia  as well as type 2 diabetes.  He has a history of coronary disease as  mentioned above.  He has had no previous surgery.   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  He  quit smoking in 1989.  He denies alcohol use.   FAMILY HISTORY:  Positive for coronary disease in his mother,  father,  and brothers.   PHYSICAL EXAMINATION:  VITAL SIGNS:  His blood pressure is 169/93, and  his pulse is 60 and regular.  Respiratory rate is 18 and unlabored.  Oxygen saturation on room air is 96%.  GENERAL:  He is a well-developed white male in no distress.  HEENT EXAM:  Shows him to  be normocephalic and atraumatic.  Pupils were  equal and reactive to light and accommodation.  Extraocular muscles are  intact.  His throat is clear.  NECK EXAM:  Shows normal carotid pulses bilaterally.  There are no  bruits.  There is no adenopathy or thyromegaly.  CARDIAC EXAM:  Shows a regular rate and rhythm with normal S1 and S2.  There is no murmur, rub or gallop.  LUNGS:  Clear.  ABDOMINAL EXAM:  Shows active bowel sounds.  His abdomen is soft and  nontender.  There are no palpable masses or organomegaly.  EXTREMITY EXAM:  Shows no peripheral edema.  Pedal pulses are palpable  bilaterally.  SKIN:  Warm and dry.   IMPRESSION:  Shane Holloway has severe three-vessel coronary disease with  exertional anginal symptoms that have been progressing.  I agree that  coronary artery bypass graft surgery is the best treatment to prevent  further ischemia and infarction.  I have discussed the operative  procedure with the patient including alternatives, benefits, and risks  including but not limited to bleeding, blood transfusion, infection,  stroke, myocardial infarction, __________  and death.  He understands  and would like to proceed with surgery.  We will schedule this for  Wednesday, 07/29/2007.   Evelene Croon, M.D.  Electronically Signed   BB/MEDQ  D:  07/21/2007  T:  07/23/2007  Job:  [id number]   cc:   Thomas C. Wall, MD, [facility]

## 2010-10-23 NOTE — Assessment & Plan Note (Signed)
[facility]                            CARDIOLOGY OFFICE NOTE   Shane, Holloway                    MRN:          [id number]  DATE:12/09/2007                            DOB:          [birth year]    [name] returns today for further management of his coronary artery  disease and multiple risk factors.  He is now about 5 months out from  coronary bypass grafting.  Please refer to my previous notes.   He is back doing manual work.  He is having no symptoms of angina or  ischemia.  He is tolerating the heat and humidity pretty well. His  weight had dropped initially from 232 down at 218, but is now back at  222.  His blood sugars are more tightly controlled.   Apparently, he was having a cough and he has now been listed as ACE  intolerant.  He has been placed on Cozaar by Dr. Jens Holloway.   CURRENT MEDICATIONS:  1. Aspirin 325 mg day.  2. Flomax 0.4 mg a day.  3. Cozaar 100 mg a day.  4. Lipitor 80 mg nightly.  5. Metformin 1000 mg daily.  6. Amlodipine 5 mg a day.  7. Metoprolol tartrate 100 mg p.o. b.i.d.  8. Nitroglycerin p.r.n..   PHYSICAL EXAMINATION:  GENERAL:  He looks remarkably good.  VITAL SIGNS:  His blood pressure is 138/79, his pulse is 72 and regular,  his weight is 222.  HEENT:  Unchanged.  Carotid upstrokes were equal bilaterally without  bruits, no JVD.  Thyroid is not enlarged.  Trachea is midline.  LUNGS:  Clear.  HEART:  Reveals regular rate and rhythm.  Sternotomy site is stable.  ABDOMEN:  Soft, good bowel sounds.  No midline bruits.  EXTREMITIES:  Reveal no cyanosis, clubbing, or edema.  Pulses are  intact.   He shows me several areas that look like seborrheic keratoses and they  are itching.  They have come out in the last 3 months.  There is no sign  of infection or inflammation.  He does not have any evidence of a drug  eruption rash.   PLAN:  1. Continue current medications.  2. I gave him some steroid  cream for these areas on his neck and back      p.r.n.  If they do not improve significantly, he was advised to see      Dr. Earl Holloway concerning this.   I will plan on seeing him back a year out from his coronary bypass  surgery, which will be February 2010.     Shane C. Daleen Squibb, MD, [facility]  Electronically Signed    TCW/MedQ  DD: 12/09/2007  DT: 12/10/2007  Job #: [id number]   cc:   Shane Holloway A. Shane Holloway, M.D.

## 2010-10-23 NOTE — Op Note (Signed)
Shane Holloway, Shane Holloway             ACCOUNT NO.:  [id number]   MEDICAL RECORD NO.:  [id number]          PATIENT TYPE:  INP   LOCATION:  2306                         FACILITY:  [facility]   PHYSICIAN:  Evelene Croon, M.D.     DATE OF BIRTH:  1950-11-02   DATE OF PROCEDURE:  07/29/2007  DATE OF DISCHARGE:                               OPERATIVE REPORT   PREOPERATIVE DIAGNOSIS:  Severe three-vessel coronary artery disease.   POSTOPERATIVE DIAGNOSIS:  Severe three-vessel coronary disease   OPERATIVE PROCEDURE:  Median sternotomy, extracorporeal circulation,  coronary bypass graft surgery x4 using a left internal mammary artery  graft to the left anterior descending coronary, with a saphenous vein  graft to the diagonal branch of the LAD, a saphenous vein graft to the  obtuse marginal branch of left circumflex coronary artery, and a  saphenous vein graft to the posterior descending branch of the right  coronary artery.  Endoscopic vein harvesting from the right leg.   SURGEON:  Evelene Croon, M.D.   ASSISTANT:  Rowe Clack, P.A.-C.   ANESTHESIA:  General endotracheal.   CLINICAL HISTORY:  This patient is a 60 year old gentleman with a  history of diabetes, hypertension, family history of heart disease, and  a history of coronary disease who is status post non-Q-wave myocardial  infarction in 2004 followed by stenting of a diagonal and mid right  coronary lesion.  He had a drug-eluting stent in the mid right coronary  artery as well as at the ostium of the diagonal with a cutting balloon  angioplasty of the diagonal.  He did well until the past few weeks when  he has had recurrent exertional chest tightness and shortness of breath.  Repeat catheterization on July 17, 2007 showed severe 3-vessel  disease.  The left main was normal.  The LAD had proximal calcification  with a long 70% stenosis before the first diagonal branch.  There is  also a long 70-80% stenosis after the diagonal  branch.  There was a  focal 70-80% stenosis in the mid vessel just before the second diagonal  branch.  The first diagonal was a large vessel with an ostial 70-80%  stenosis.  The stented area had luminal irregularities.  Left circumflex  had a single marginal branch that had 70% ostial stenosis.  The right  coronary artery was a dominant vessel with diffuse 25-30% stenosis.  The  stent in the midportion was widely patent.  There was also about 50%  stenosis before the posterior descending branch and 60-70% ostial  stenosis in the posterior descending branch.  There were 2  posterolateral branch that had no disease in them.  Left ventricular  ejection fraction was about 60% with normal wall motion.  After review  of the angiogram and examination of the patient, it was felt that  coronary bypass graft surgery was the best treatment to prevent further  ischemia and infarction.  I discussed the operative procedure with the  patient including alternatives, benefits, and risks, including but not  limited to bleeding, blood transfusion, infection, stroke, myocardial  infarction, graft failure, and death.  He understood and agreed to  proceed.   OPERATIVE PROCEDURE:  The patient was taken to the operating room and  placed on the table in the supine position.  After induction of general  endotracheal anesthesia, a Foley catheter was placed in the bladder  using sterile technique.  Then the chest, abdomen and both lower  extremities were prepped and draped in the usual sterile manner.  The  chest was entered through a median sternotomy incision.  The pericardium  was opened in the midline.  Examination of the heart showed good  ventricular contractility.  The ascending aorta had no palpable plaques  in it.   Then the left internal mammary artery was harvested from the chest wall  as a pedicle graft.  This was a  medium caliber vessel with excellent  blood pressure through it.  At the same time  a segment of greater  saphenous vein was harvested from the right leg using endoscopic vein  harvest technique.  This vein was of medium size and good quality.   Then the patient was heparinized and when an adequate activated clotting  time was achieved the distal ascending aorta was cannulated using a 20-  [place] aortic cannula for arterial inflow.  Venous outflow was achieved  using a 2-stage venous cannula for the right atrial appendage.  An  antegrade cardioplegia and vent cannula was inserted in the aortic root.   The patient was placed on cardiopulmonary bypass and the distal  coronaries identified.  The LAD was severely diseased in its proximal  and midportion.  Just beyond the second diagonal branch it was suitable  for grafting.  The more proximal LAD was calcified and not graftable.  The first diagonal branch was heavily diseased in its proximal portion.  In the mid to distal portion it was suitable for grafting.  The second  diagonal was a small nongraftable vessel.  The left circumflex had a  single large marginal branch that had some proximal disease in it.  There was no distal plaque in it.  The right coronary artery was  diffusely diseased.  There was a large posterior descending branch that  had disease in its proximal portion.  The posterolateral branches were  moderate size vessels.  I decided not to graft the posterolateral  branches since they were only compromised by about 50% stenosis in the  distal right coronary artery at the most, and I was concerned that the  competitive flow may cause this segment of the vein graft to shut down.   Then the aorta was crossclamped and 1000 mL of cold blood antegrade  cardioplegia was administered in the aortic root with quick arrest of  the heart.  Systemic hypothermia to 28 degrees centigrade and topical  hypothermic iced saline was used.  An insulating pad was placed in the  pericardium and a septal probe in the  interventricular septum to monitor  myocardial temperature.   The first distal anastomosis was performed to the obtuse marginal  branch.  The internal diameter was 2.5 mm.  Conduit used was a segment  of greater saphenous vein and the anastomosis formed in an end-to-side  manner using a continuous 7-0 Prolene suture.  Flow was noted through  the graft and was excellent.   The second distal anastomosis was performed to the posterior descending  coronary.  The internal diameter was 1.75 mm.  The conduit used was a  second segment of greater saphenous vein and the anastomosis performed  in a end-to-side manner using a continuous 7-0 Prolene suture.  Flow was  noted through the graft and was excellent.  Then another dose of  cardioplegia was given down the vein grafts and the aortic root.   The third distal anastomosis was performed to the diagonal branch.  The  internal diameter of this vessel was about 1.6 mm.  The conduit used was  a third segment of greater saphenous vein and the anastomosis performed  in a end-to-side manner using continuous 7-0 Prolene suture.  Flow was  noted through the graft and was excellent.   The fourth distal anastomosis was performed to the distal LAD.  The  internal diameter was 1.75 mm.  The conduit used was a left internal  mammary graft and this was brought through an opening in the left  pericardium anterior to the phrenic nerve.  This was anastomosed to the  LAD in an end-to-side manner using continuous 8-0 Prolene suture.  The  pedicle was sutured to the epicardium with 6-0 Prolene sutures.  The  patient was rewarmed to 37 degrees centigrade.  With the crossclamp in  place, the proximal anastomoses of the obtuse marginal and posterior  descending vein graft were performed to the aortic root in end-to-side  manner using continuous 6-0 Prolene suture.  The vein graft to the  diagonal branch was too short to reach the aorta comfortably due to the  large  heart and pulmonary artery.  Therefore I anastomosed this to the  proximal portion of the obtuse marginal vein graft in an end-to-side  manner using continuous 7-0 Prolene suture.  Then the clamp was removed  from the mammary pedicle.  There was rapid warming of the ventricular  septum and return of spontaneous ventricular fibrillation.  The  crossclamp was removed with a time of 75 minutes and the patient  spontaneously converted to sinus rhythm.   The proximal and distal anastomoses appeared hemostatic and the lie of  the grafts satisfactory.  Graft markers were placed around the proximal  anastomoses.  Two temporary right ventricular and right atrial pacing  wires were placed and brought out through the skin.   When the patient had rewarmed to 37 degrees centigrade, he was weaned  from cardiopulmonary bypass on no inotropic agents.  Total bypass time  was 92 minutes.  Cardiac function appeared excellent with a cardiac  output of 7 liters per minute.  Protamine was given and the venous and  aortic cannulas were removed without difficulty.  Hemostasis was  achieved.  Three chest tubes were placed with 2 in the posterior  pericardium, 1 in the left pleural space and 1 in the anterior  mediastinum.  The pericardium was loosely reapproximated over the heart.  The sternum was closed with #6 stainless steel wires.  The fascia was  closed with continuous #1 Vicryl suture.  Subcutaneous tissue was closed  with continuous 2-0 Vicryl and skin with a 3-0 Vicryl subcuticular  closure.  The lower extremity vein harvest site was closed in layers in  a similar manner.  The sponge, needle and instrument counts were correct  according to the scrub nurse.  Dry sterile dressings were applied over  the incisions around the chest tubes which were hooked to Pleur-Evac  suction.  The patient remained hemodynamically stable and was  transported to the SICU in guarded but stable condition.      Evelene Croon, M.D.  Electronically Signed     BB/MEDQ  D:  07/29/2007  T:  07/30/2007  Job:  [id number]   cc:   Thomas C. Wall, MD, [facility]

## 2010-10-23 NOTE — Assessment & Plan Note (Signed)
[facility]                            CARDIOLOGY OFFICE NOTE   ARA, MANO                    MRN:          [id number]  DATE:10/23/2007                            DOB:          12-15-1950    HISTORY:  Mr. Loll returns today.  He is about 3 months out from  coronary bypass grafting.  He has done remarkably well.  He has lost  about 20-25 pounds.  He is still on cardiac rehab at [facility]  His  biggest problem is his sugar which he is going to see a diabetologist  next week for.  He cannot remember the name.  In addition, his blood  pressure has been running about 150 systolic at rehab.  He denies  orthopnea, PND or peripheral edema.   CURRENT MEDICATIONS:  1. Aspirin 325 mg a day.  2. Flomax 0.4 mg a day.  3. Atenolol 100 mg a day.  4. Cozaar 100 mg a day which I had increased last visit.  5. Lipitor 80 mg q.h.s.  6. Metformin 1000 mg p.o. daily.   PHYSICAL EXAMINATION:  VITAL SIGNS:  Blood pressure today is 161/91.  I  rechecked it, it was that high or higher.  Pulse is 80 and regular.  Weight is 219.  He has lost significant weight.  HEENT:  Unchanged.  NECK:  Carotid upstrokes are equal bilaterally without bruits, no JVD.  Thyroid is not enlarged.  Neck is supple.  LUNGS:  Clear.  His sternotomy site is stable.  ABDOMEN:  Soft with good bowel sounds.  EXTREMITIES:  Reveal no edema.  Pulses are intact.  NEURO:  Intact.   ASSESSMENT/PLAN:  I am concerned about Mr. Semmel blood pressure.  I  have added amlodipine 5 mg p.o. q.a.m.  He will continue to go to rehab  where he will have his pressure monitored.   FOLLOW UP:  I will see him back in about 6 weeks for followup.     Thomas C. Daleen Squibb, MD, [facility]  Electronically Signed    TCW/MedQ  DD: 10/23/2007  DT: 10/23/2007  Job #: [id number]   cc:   Brett Canales A. Cleta Alberts, M.D.

## 2010-10-23 NOTE — Discharge Summary (Signed)
NAMENORM, WRAY             ACCOUNT NO.:  [id number]   MEDICAL RECORD NO.:  [id number]          PATIENT TYPE:  INP   LOCATION:  2006                         FACILITY:  [facility]   PHYSICIAN:  Evelene Croon, M.D.     DATE OF BIRTH:  06-01-1951   DATE OF ADMISSION:  07/29/2007  DATE OF DISCHARGE:                               DISCHARGE SUMMARY   FINAL DIAGNOSIS:  Severe three-vessel coronary artery disease.   IN-HOSPITAL DIAGNOSES:  1. Postoperative atrial fibrillation.  2. Volume overload postoperatively.   SECONDARY DIAGNOSES:  1. Hypertension.  2. Hyperlipidemia.  3. Type 2 diabetes mellitus.  4. History of coronary artery disease status post non-Q-wave      myocardial infarction in 2004 followed by stenting of diagonal a      mid right coronary lesion.  He had a drug-eluting stent in the mid      right coronary artery as well as ostium of the diagonal with a      cutting balloon angioplasty of the diagonal.   IN-HOSPITAL OPERATIONS AND PROCEDURES:  Coronary artery bypass grafting  x4 using a left internal mammary artery graft to the left anterior  descending coronary, saphenous vein graft to the diagonal branch of the  LAD, saphenous vein graft to the obtuse marginal branch of the left  circumflex coronary artery, saphenous vein graft to the posterior  descending branch of the right coronary artery.  Endoscopic vein harvest  from the right leg was done.   HISTORY AND PHYSICAL AND HOSPITAL COURSE:  The patient is a 60 year old  gentleman with a history of diabetes, hypertension, family history of  heart disease, and history of coronary artery disease who is status post  non-Q-wave myocardial infarction in 2004 followed by stenting of the  diagonal and mid right coronary lesion.  He had a drug-eluting stent in  the mid right coronary artery as well as ostium of the diagonal with a  cutting balloon angioplasty of the diagonal.  The patient did well until  the past few weeks  when he has had recurring exertional chest tightness  and shortness of breath.  Repeat catheterization on July 17, 2007  showed severe three-vessel coronary artery disease.  Dr. Laneta Simmers was  consulted.  Dr. Laneta Simmers saw and evaluated the patient.  He discussed with  the patient undergoing coronary artery bypass grafting.  He discussed  the risks and benefits with the patient.  The patient acknowledged  understanding and agreed to proceed.  Surgery was scheduled for July 29, 2007.  For details of the patient's past medical history and  physical exam, please see dictated H&P.   The patient was taken to the operating room on July 29, 2007 where  he underwent coronary artery bypass grafting x4 using a left internal  mammary artery to the left anterior descending coronary artery,  saphenous vein graft to the diagonal branch of the LAD, saphenous vein  graft to the obtuse marginal branch of the left circumflex coronary  artery, saphenous vein graft to the posterior descending branch of the  right coronary.  Endoscopic vein harvesting from the  right leg was done.  The patient tolerated this procedure and was transferred to the  intensive care unit in stable condition.  Postoperatively, the patient  was noted to be hemodynamically stable.  He was extubated on the evening  of surgery.  Post-extubation, the patient noted to be alert and or x4.  Neuro intact.  Post-extubation, the patient was placed on nasal cannula.  He was sating greater than 90% on room air.  Followup chest x-ray on  postoperative day #1 was stable.  Chest tubes were discontinued in the  normal fashion.  Repeat chest x-ray on the following day remained  stable.  The patient was using his incentive spirometer.  He was able to  be weaned off oxygen, sating greater than 90% on room air.  Postoperatively, from a cardiac standpoint, the patient was noted to be  in normal sinus rhythm following surgery.  He was able to be  started  back on his Altace as well as low-dose Lopressor.  The patient's heart  rate and blood pressure remained stable until postoperative day #4 in  the early morning when he went into rapid atrial fibrillation with a  heart rate in the 160s.  He was ordered to be given amiodarone 400 mg  b.i.d. and to give a.m. dose of Lopressor at that point.  Following  this, the patient was able to convert back to normal sinus rhythm on his  own.  The rest of postop day #4, the patient remained in normal sinus  rhythm.  It was felt that amiodarone could be discontinued, and  Lopressor was increased further.  Plan to continue to monitor the  patient's heart rate during the remainder of his hospital course.   Postoperatively, the patient was noted to be diabetic.  Blood sugars  were followed closely.  He was initially started on Lantus insulin and  sliding scale insulin postoperatively.  Blood sugars were stable, and  the patient was tolerating diet without nausea or vomiting.  He was  switched to metformin p.o. during his hospital course, and Lantus  insulin was discontinued.  Blood sugars remained stable on metformin.   Postoperatively, the patient had significant volume overload.  He was  started on diuretics.  Daily weights were obtained, and edema improved.  The patient was below his baseline weight prior to discharge home.  Postoperatively, the patient was out of bed and ambulating well without  difficulty.  He was tolerating diet well.  No nausea or vomiting noted.   On postoperative day #4, the patient was noted to be afebrile.  Heart  rate and blood pressure stable.  He was sating greater than 90% on room  air.   LABORATORY DATA:  Labs on postoperative day #2, showed a white count  13.4, hemoglobin 13, hematocrit 38.1, platelet count 149.  The patient  was in normal sinus rhythm.  Pulmonary status was stable.  Incisions  were all clean, dry, and intact and healing well.  The patient is   tentatively ready for discharge home in the a.m. of postop day #5,  August 03, 2007.  He remains in normal sinus rhythm.   FOLLOWUP APPOINTMENTS:  Followup appointment has been arranged with Dr.  Laneta Simmers for August 25, 2007 at 12:15 p.m.  The patient will need to obtain  PA and lateral chest x-ray 30 minutes prior to this appointment.  The  patient will need to follow up with Dr. Daleen Squibb in 2 weeks.  He will need  to contact  Dr. Vern Claude office to make these arrangements.   ACTIVITY:  The patient instructed on no driving until released to do so,  no lifting over 10 pounds.  He was told to ambulate 3-4 times per day,  progress as tolerated, and continue his breathing exercises.   WOUND CARE:  The patient was told to shower, washing his incisions using  soap and water.  He is contact the office if he develops any drainage or  opening from any of his incision sites.   DIET:  The patient educated on diet to be low-fat, low-salt.   DISCHARGE MEDICATIONS:  1. Aspirin 325 mg daily.  2. Lopressor 75 mg b.i.d.  3. Altace 10 mg daily.  4. Metformin 1000 mg daily.  5. Oxycodone 5 mg 1-2 tablets q.4-6h. p.r.n.      Theda Belfast, [place]      Evelene Croon, M.D.  Electronically Signed    KMD/MEDQ  D:  08/02/2007  T:  08/03/2007  Job:  [id number]   cc:   Evelene Croon, M.D.  Thomas C. Wall, MD, [facility]

## 2010-10-26 NOTE — Discharge Summary (Signed)
NAMEARTHURO, CANELO                       ACCOUNT NO.:  [id number]   MEDICAL RECORD NO.:  [id number]                   PATIENT TYPE:  INP   LOCATION:  6529                                 FACILITY:  [facility]   PHYSICIAN:  Charlies Constable, M.D.                  DATE OF BIRTH:  June 25, 1950   DATE OF ADMISSION:  02/21/2003  DATE OF DISCHARGE:  02/25/2003                                 DISCHARGE SUMMARY   HISTORY:  Mr. Shippy is a 60 year old male who presented with a two week  history of exertional chest discomfort. He stated that on the day prior to  admission he had a severe episode that lasted approximately one hour  relieved with nitroglycerin, thus he came to the emergency room for  evaluation. His risk factors are positive for diabetes, hypertension,  positive family history with his father and brother having bypass surgery  and his mother has had a myocardial infarction as well as hyperlipidemia.   LABORATORY DATA:  Fasting lipids showed a total cholesterol of 192,  triglycerides 345, HDL 38, LDL 85. CK, total MB, and relative indexes were  negative for myocardial infarction, however troponins were slightly abnormal  at 0.11, 0.11, and 0.28. Admission sodium was 141, potassium 3.4, BUN 16,  creatinine 1.1. Subsequent chemistries showed correction of his hypokalemia  and normal LFTs. Admission PT 12.7, PTT 29. Admission H&H 14.0 and 39.7.  Normal indices. Platelets 172, WBCs 6.5. Subsequent hematologies were all  unremarkable. Admission EKG showed a normal sinus rhythm, nonspecific ST-T  wave changes. Subsequent EKGs were essentially the same.   HOSPITAL COURSE:  Dr. Juanda Chance admitted the patient and placed him on aspirin,  beta blocker, nitroglycerin and Integrilin and felt that he should undergo  cardiac catheterization.  This was performed on the day of admission by Dr.  Samule Ohm. According to Dr. Melinda Crutch progress note he had a 70% mid RCA, 30%  OM1, 50% proximal LAD at the  first diagonal, 30% mid LAD, 99% diagonal 1,  99% mid diagonal 1. EF was 60% without wall motion abnormalities. Utilizing  a cutting balloon Dr. Samule Ohm reduced the 99 proximal diagonal 1 lesion to  approximately 30% and placed a Taxus stent in the mid diagonal reducing it  to 0%. Dr. Samule Ohm noted that he should maintain Plavix therapy for one year.  The sheath was removed, remained on bed rest. Catheterization site was  intact. It was noted in regards to his diabetic history that Glucophage was  held for 48 hours. Cardiac rehab participated in ambulation and education.  The diabetes coordinator also saw the patient. Diabetes coordinator noted  that his triglycerides were high at 345 and felt that he would benefit from  outpatient diabetic education and reinforced compliance with diabetes  protocol. Dr. Juanda Chance felt that the patient had a non-Q-wave myocardial  infarction based on enzymes. His hypokalemia was supplemented. On September  16 he underwent  recatheterization to stent the mid RCA. This was performed  by Dr. Samule Ohm utilizing a Taxus stent without difficulty. Again, Dr. Samule Ohm  noted Plavix for one year, aspirin indefinitely. Post sheath removal and bed  rest he did have a small catheterization site hematoma, however, no bruit.  Medications were adjusted. On September 17 it was felt that the patient  could be discharged home.   DISCHARGE DIAGNOSES:  1. Non-Q-wave myocardial infarction.  2. Hyperlipidemia.  3. Diabetes history as previously.   DISPOSITION:  He received a new prescription for Plavix 75 mg daily for one  year and Altace 5 mg daily. He is advised to increase his aspirin to 325 mg  daily and to increase his Lipitor to 80 mg q.h.s. and to resume his  Glucophage to two times a day after discharge. His Atenolol was decreased to  50 mg daily. He was asked to continue using sublingual nitroglycerin as  needed. He was advised no lifting, driving, sexual activity, or heavy   exertion for two weeks. Maintain a low-salt, low-fat, low-cholesterol diet.  If he has any problems with his  catheterization site he was asked to call. He will need fasting lipids and  LFTs in 6-8 weeks, since his Lipitor was increased. He will follow up with  Dr. Samule Ohm on October 12 at 12 o'clock p.m. At the time of follow-up  consideration should be given to referring the patient to diabetic  outpatient management if this has not been done.      Joellyn Rued, P.A. [facility]                    Charlies Constable, M.D.    EW/MEDQ  D:  04/20/2003  T:  04/20/2003  Job:  [id number]   cc:   Salvadore Farber, M.D.

## 2010-10-26 NOTE — Cardiovascular Report (Signed)
Shane Holloway, Shane Holloway                       ACCOUNT NO.:  [id number]   MEDICAL RECORD NO.:  [id number]                   PATIENT TYPE:  INP   LOCATION:  6529                                 FACILITY:  [facility]   PHYSICIAN:  Salvadore Farber, M.D.             DATE OF BIRTH:  Sep 22, 1950   DATE OF PROCEDURE:  02/24/2003  DATE OF DISCHARGE:  02/25/2003                              CARDIAC CATHETERIZATION   PROCEDURES:  1. Stent to the mid-right coronary artery.  2. Re-look angiography of the diagonal.   INDICATIONS:  Shane Holloway is a 60 year old gentleman who presented with  unstable coronary syndrome on February 22, 2003.  He underwent  catheterization demonstrating serial 99% stenoses in the diagonal branch.  These were treated with cutting balloon angioplasty of the ostium of the  diagonal and drug-eluting stent placement in the midportion of the diagonal.  He has done well since and returns today for planned staged intervention of  the severe stenosis of the mid-RCA.   PROCEDURAL TECHNIQUE:  Informed consent was obtained.  Under 1% lidocaine  local anesthesia, a 6 French sheath was placed in the left femoral artery  using the modified Seldinger technique.  A JL5 catheter was used for  angiography of the left system.  This demonstrated the diagonal lesions to  be stable with less than 30% ostial stenosis and widely patent stent with no  residual stenosis.   Attention was then turned to the RCA.  Anticoagulation was initiated with  heparin and eptifibatide to achieve an ACT of greater than 200 seconds.  A  JR4 guide was advanced over a wire and engaged in the ostium of the RCA.  A  Luge wire was advanced to the distal RCA.  The lesion was directly stented  using a 3.5 x 16 mm Taxus, deploying at 14 atmospheres.  Immediately after  stent deployment, the patient complained of chest discomfort.  There was no  evidence of dissection, and flow to the distal vessel remained TIMI-3.  Careful inspection of the distal vasculature showed no evidence of  embolization save TIMI-2 flow in a long but very small-diameter acute  marginal.  Intracoronary nitroglycerin and verapamil was administered with  some improvement in his discomfort.  The electrocardiographic abnormalities  that occurred with balloon inflation resolved on balloon deflation.  I then  post-dilated the stent using a 3.5 x 15 mm PowerSail at 16 atmospheres.  Final angiograms demonstrated no residual stenosis, TIMI-3 flow to the  distal vasculature save TIMI-2 flow into the acute marginal.   IMPRESSION/RECOMMENDATION:  Successful stenting of the mid-right coronary  artery resulting in no residual stenosis and TIMI-3 flow to the distal  vasculature.  Will plan on continuing eptifibatide for 18 hours.  Plavix  will be continued for a year.  Aspirin will be continued indefinitely.  Salvadore Farber, M.D.    WED/MEDQ  D:  02/24/2003  T:  02/25/2003  Job:  [id number]   cc:   [facility]

## 2011-01-17 ENCOUNTER — Ambulatory Visit: Payer: Self-pay | Admitting: Cardiology

## 2011-01-31 ENCOUNTER — Encounter: Payer: Self-pay | Admitting: Cardiology

## 2011-03-04 LAB — POCT I-STAT 3, ART BLOOD GAS (G3+)
Acid-Base Excess: 1
Bicarbonate: 27.2 — ABNORMAL HIGH
O2 Saturation: 95
Operator id: 284731
Operator id: 3406
TCO2: 24
pCO2 arterial: 39.9
pCO2 arterial: 40.2
pH, Arterial: 7.351
pH, Arterial: 7.363
pH, Arterial: 7.391
pO2, Arterial: 314 — ABNORMAL HIGH
pO2, Arterial: 83

## 2011-03-04 LAB — POCT I-STAT 4, (NA,K, GLUC, HGB,HCT)
Glucose, Bld: 119 — ABNORMAL HIGH
HCT: 27 — ABNORMAL LOW
HCT: 36 — ABNORMAL LOW
HCT: 38 — ABNORMAL LOW
Hemoglobin: 11.2 — ABNORMAL LOW
Hemoglobin: 12.2 — ABNORMAL LOW
Hemoglobin: 12.9 — ABNORMAL LOW
Hemoglobin: 9.2 — ABNORMAL LOW
Operator id: 241461
Operator id: 3406
Potassium: 3.5
Potassium: 4.1
Potassium: 4.8
Sodium: 136
Sodium: 139
Sodium: 139
Sodium: 140

## 2011-03-04 LAB — URINE MICROSCOPIC-ADD ON

## 2011-03-04 LAB — COMPREHENSIVE METABOLIC PANEL
ALT: 57 — ABNORMAL HIGH
Alkaline Phosphatase: 60
BUN: 14
CO2: 22
Chloride: 104
GFR calc non Af Amer: >60
Glucose, Bld: 132 — ABNORMAL HIGH
Potassium: 4
Total Bilirubin: 1.2
Total Protein: 6.5

## 2011-03-04 LAB — CBC
HCT: 37.9 — ABNORMAL LOW
HCT: 38.1 — ABNORMAL LOW
HCT: 43.6
Hemoglobin: 13
Hemoglobin: 13.2
Hemoglobin: 15.4
MCHC: 34.2
MCHC: 34.7
MCHC: 34.8
Platelets: 130 — ABNORMAL LOW
Platelets: 153
RBC: 3.99 — ABNORMAL LOW
RBC: 4.43
RBC: 5.13
RDW: 13.4
RDW: 13.5
RDW: 13.7
WBC: 8.2

## 2011-03-04 LAB — BLOOD GAS, ARTERIAL
Bicarbonate: 24.8 — ABNORMAL HIGH
Patient temperature: 98.6
TCO2: 26
pH, Arterial: 7.423
pO2, Arterial: 86.5

## 2011-03-04 LAB — I-STAT EC8
BUN: 12
Bicarbonate: 22.3
Glucose, Bld: 199 — ABNORMAL HIGH
Hemoglobin: 11.2 — ABNORMAL LOW
Sodium: 136
TCO2: 23
pCO2 arterial: 37.7
pH, Arterial: 7.38

## 2011-03-04 LAB — URINALYSIS, ROUTINE W REFLEX MICROSCOPIC
Glucose, UA: 250 — AB
Hgb urine dipstick: NEGATIVE
Ketones, ur: NEGATIVE
Specific Gravity, Urine: 1.03
pH: 5.5

## 2011-03-04 LAB — BASIC METABOLIC PANEL
BUN: 12
CO2: 30
Calcium: 8.6
Chloride: 106
GFR calc non Af Amer: >60
Glucose, Bld: 138 — ABNORMAL HIGH
Glucose, Bld: 168 — ABNORMAL HIGH
Potassium: 3.8
Potassium: 3.9
Sodium: 138
Sodium: 140

## 2011-03-04 LAB — PROTIME-INR
INR: 1.3
Prothrombin Time: 16.5 — ABNORMAL HIGH

## 2011-03-04 LAB — CREATININE, SERUM
Creatinine, Ser: 0.89
GFR calc Af Amer: >60
GFR calc non Af Amer: >60

## 2011-03-04 LAB — HEMOGLOBIN A1C
Hgb A1c MFr Bld: 7.2 — ABNORMAL HIGH
Mean Plasma Glucose: 179

## 2011-03-04 LAB — PLATELET COUNT: Platelets: 141 — ABNORMAL LOW

## 2011-03-04 LAB — TYPE AND SCREEN: Antibody Screen: NEGATIVE

## 2011-05-06 ENCOUNTER — Encounter: Payer: Self-pay | Admitting: Cardiology

## 2011-08-10 ENCOUNTER — Ambulatory Visit: Payer: Self-pay | Admitting: Family Medicine

## 2011-08-10 DIAGNOSIS — I10 Essential (primary) hypertension: Secondary | ICD-10-CM

## 2011-08-10 DIAGNOSIS — N390 Urinary tract infection, site not specified: Secondary | ICD-10-CM

## 2011-08-10 DIAGNOSIS — E782 Mixed hyperlipidemia: Secondary | ICD-10-CM

## 2011-08-10 DIAGNOSIS — IMO0001 Reserved for inherently not codable concepts without codable children: Secondary | ICD-10-CM

## 2011-08-10 DIAGNOSIS — E785 Hyperlipidemia, unspecified: Secondary | ICD-10-CM

## 2011-08-10 DIAGNOSIS — R42 Dizziness and giddiness: Secondary | ICD-10-CM

## 2011-08-10 LAB — POCT UA - MICROSCOPIC ONLY
Casts, Ur, LPF, POC: NEGATIVE
Crystals, Ur, HPF, POC: NEGATIVE
Mucus, UA: NEGATIVE
Yeast, UA: NEGATIVE

## 2011-08-10 LAB — POCT CBC
Granulocyte percent: 59.3 %G (ref 37–80)
HCT, POC: 42.3 % — AB (ref 43.5–53.7)
Hemoglobin: 14.1 g/dL (ref 14.1–18.1)
Lymph, poc: 1.7 (ref 0.6–3.4)
MCH, POC: 28.7 pg (ref 27–31.2)
MCHC: 33.3 g/dL (ref 31.8–35.4)
MCV: 85.9 fL (ref 80–97)
MID (cbc): 0.2 (ref 0–0.9)
MPV: 8.9 fL (ref 0–99.8)
POC Granulocyte: 2.8 (ref 2–6.9)
POC LYMPH PERCENT: 35.9 %L (ref 10–50)
POC MID %: 4.9 %M (ref 0–12)
Platelet Count, POC: 202 10*3/uL (ref 142–424)
RBC: 4.92 M/uL (ref 4.69–6.13)
RDW, POC: 14.6 %
WBC: 4.7 10*3/uL (ref 4.6–10.2)

## 2011-08-10 LAB — COMPREHENSIVE METABOLIC PANEL
ALT: 34 U/L (ref 0–53)
AST: 30 U/L (ref 0–37)
Albumin: 4.7 g/dL (ref 3.5–5.2)
Alkaline Phosphatase: 68 U/L (ref 39–117)
BUN: 17 mg/dL (ref 6–23)
CO2: 25 mEq/L (ref 19–32)
Calcium: 9.8 mg/dL (ref 8.4–10.5)
Chloride: 104 mEq/L (ref 96–112)
Creat: 0.8 mg/dL (ref 0.50–1.35)
Glucose, Bld: 123 mg/dL — ABNORMAL HIGH (ref 70–99)
Potassium: 3.9 mEq/L (ref 3.5–5.3)
Sodium: 140 mEq/L (ref 135–145)
Total Bilirubin: 1.1 mg/dL (ref 0.3–1.2)
Total Protein: 6.8 g/dL (ref 6.0–8.3)

## 2011-08-10 LAB — POCT URINALYSIS DIPSTICK
Blood, UA: NEGATIVE
Protein, UA: 30
Spec Grav, UA: 1.02
Urobilinogen, UA: 0.2

## 2011-08-10 LAB — LIPID PANEL
Cholesterol: 178 mg/dL (ref 0–200)
HDL: 35 mg/dL — ABNORMAL LOW (ref >39)
LDL Cholesterol: 117 mg/dL — ABNORMAL HIGH (ref 0–99)
Total CHOL/HDL Ratio: 5.1 Ratio
Triglycerides: 130 mg/dL (ref <150)
VLDL: 26 mg/dL (ref 0–40)

## 2011-08-10 LAB — POCT GLYCOSYLATED HEMOGLOBIN (HGB A1C): Hemoglobin A1C: 7.3

## 2011-08-10 LAB — GLUCOSE, POCT (MANUAL RESULT ENTRY): POC Glucose: 121

## 2011-08-10 MED ORDER — CIPROFLOXACIN HCL 500 MG PO TABS: 500.0000 mg | ORAL_TABLET | Freq: Two times a day (BID) | ORAL | Status: AC

## 2011-08-10 NOTE — Patient Instructions (Signed)

## 2011-08-10 NOTE — Progress Notes (Signed)
Subjective: Patient has not been feeling well the past few days. He's had problems with dizziness, especially with position changes. When he gets up from bed and goes across a room he is staggering. He notices a little room spinning sensation when he lays down and looks at the clock. The dizziness is not all the time but it's mostly with position change. He does not usually check his sugars on a regular basis, but he has last few days and got very high readings around 300s. He watched his diet yesterday, he came in without eating this morning in order to get checked. Other than arthritic pains, he does not have any other major complaints of pain. Minimal headache. Cardiovascular unremarkable. Respiratory unremarkable. GI unremarkable. GU [place]. Musculoskeletal has bad knee and aches here and there. He is a little unsteady because of the knee. 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  Objective: Alert oriented white male in no acute distress. Eyes PERRLA. TMs normal though he has some wax buildup in both canals. Throat was clear. Neck supple without nodes or thyromegaly. No carotid bruits. Chest is clear to auscultation. Heart regular without murmurs gallops arrhythmias. Motor strength is good with normal gait except for a level bit of irregularity due to his arthritic knee. Romberg negative. Heel toe adequate.  Assessment: Diabetes mellitus, poorly controlled Dizziness Arthritis  Current Outpatient Prescriptions on File Prior to Visit  Medication Sig Dispense Refill  . amLODipine (NORVASC) 10 MG tablet Take 10 mg by mouth daily.        Marland Kitchen aspirin 325 MG EC tablet Take 325 mg by mouth daily.        Marland Kitchen atorvastatin (LIPITOR) 80 MG tablet Take 80 mg by mouth daily.        . metFORMIN (GLUCOPHAGE) 1000 MG tablet Take 1,000 mg by mouth 2 (two) times daily.        . Tamsulosin HCl (FLOMAX) 0.4 MG CAPS Take 0.4 mg by mouth daily.        Marland Kitchen losartan  (COZAAR) 50 MG tablet Take 50 mg by mouth daily.        Marland Kitchen triamcinolone ointment (KENALOG) 0.1 % Apply topically as directed.           Plan: Check labs and decide treatment change accordingly.  Results for orders placed in visit on 08/10/11  POCT GLYCOSYLATED HEMOGLOBIN (HGB A1C)      Component Value Range   Hemoglobin A1C 7.3    GLUCOSE, POCT (MANUAL RESULT ENTRY)      Component Value Range   POC Glucose 121    POCT URINALYSIS DIPSTICK      Component Value Range   Color, UA yellow     Clarity, UA cloudy     Glucose, UA neg     Bilirubin, UA neg     Ketones, UA neg     Spec Grav, UA 1.020     Blood, UA neg     pH, UA 5.5     Protein, UA 30     Urobilinogen, UA 0.2     Nitrite, UA neg     Leukocytes, UA moderate (2+)    POCT CBC      Component Value Range   WBC 4.7  4.6 - 10.2 (K/uL)   Lymph, poc 1.7  0.6 - 3.4    POC LYMPH PERCENT 35.9  10 - 50 (%L)   MID (cbc) 0.2  0 - 0.9    POC MID % 4.9  0 - 12 (%M)   POC Granulocyte 2.8  2 - 6.9    Granulocyte percent 59.3  37 - 80 (%G)   RBC 4.92  4.69 - 6.13 (M/uL)   Hemoglobin 14.1  14.1 - 18.1 (g/dL)   HCT, POC 16.1 (*) 09.6 - 53.7 (%)   MCV 85.9  80 - 97 (fL)   MCH, POC 28.7  27 - 31.2 (pg)   MCHC 33.3  31.8 - 35.4 (g/dL)   RDW, POC 04.5     Platelet Count, POC 202  142 - 424 (K/uL)   MPV 8.9  0 - 99.8 (fL)  POCT UA - MICROSCOPIC ONLY      Component Value Range   WBC, Ur, HPF, POC tntc     RBC, urine, microscopic 0-1     Bacteria, U Microscopic large     Mucus, UA neg     Epithelial cells, urine per micros 0-1     Crystals, Ur, HPF, POC neg     Casts, Ur, LPF, POC neg     Yeast, UA neg

## 2011-08-12 ENCOUNTER — Encounter: Payer: Self-pay | Admitting: Family Medicine

## 2011-08-12 LAB — URINE CULTURE: Colony Count: >=100000

## 2011-08-19 ENCOUNTER — Other Ambulatory Visit: Payer: Self-pay | Admitting: Family Medicine

## 2011-08-27 ENCOUNTER — Ambulatory Visit: Payer: Self-pay | Admitting: Family Medicine

## 2011-08-27 VITALS — BP 128/75 | HR 86 | Temp 97.7°F | Resp 16 | Ht 72.0 in | Wt 220.0 lb

## 2011-08-27 DIAGNOSIS — M5410 Radiculopathy, site unspecified: Secondary | ICD-10-CM

## 2011-08-27 DIAGNOSIS — M549 Dorsalgia, unspecified: Secondary | ICD-10-CM

## 2011-08-27 DIAGNOSIS — IMO0002 Reserved for concepts with insufficient information to code with codable children: Secondary | ICD-10-CM

## 2011-08-27 MED ORDER — MELOXICAM 7.5 MG PO TABS: 7.5000 mg | ORAL_TABLET | Freq: Every day | ORAL | Status: AC

## 2011-08-27 MED ORDER — HYDROCODONE-ACETAMINOPHEN 5-325 MG PO TABS: 1.0000 | ORAL_TABLET | Freq: Four times a day (QID) | ORAL | Status: AC | PRN

## 2011-08-27 NOTE — Progress Notes (Signed)
  Subjective:    Patient ID: Tommas Olp, male    DOB: 1950/07/15, 61 y.o.   MRN: [id number]  HPI This patient presents for evaluation of low back pain x 1 week.  No trauma or injury.  Has been seeing his chiropractor, but has been experiencing worsening pain. The chiropractor is making arrangements for an MRI to evaluate for a disc injury.  He c/o pain that prevents him from getting comfortable in any position and prevents him from sleeping.  No loss of bowel or bladder control.  No numbness or tingling.  Pain radiates through the left hip and thigh.  No weakness. Ibuprofen and Aleve without benefit.  Has DJD of the left knee, s/p arthroscopy.  Has been told he needs a TKR, but without insurance, he's unable to afford it.   Medical hx includes ASCVD, DM type 2, HTN, hyperlipidemia.  Review of Systems As above.      Objective:   Physical Exam  Vital signs noted. Well-developed, well nourished WM who is awake, alert and oriented, in NAD. HEENT: [place]/AT, sclera and conjunctiva are clear.   Heart: RRR, no murmur Lungs: CTA Back: tenderness over the SI region on the left, some pain of the left hip.  Reduced ROM of the hip due to back pain.  Positive straight leg raise on the left.   Neurologic:Unable to elicit patellar tendon reflex on the left, possibly due to his DJD there.  Ankle reflexes are symmetrically strong.  No ankle clonus.  Sensation is normal bilaterally. Good lower extremity strength bilaterally. Extremities: no cyanosis, clubbing or edema. Lower extremity varicosities noted. Skin: warm and dry without rash.       Assessment & Plan:  Back pain.  D/C Aleve and ibuprofen.  Meloxicam, taken opposite his daily ASA.  Norco 5/325 Q6 hours prn.  Proceed with MRI arranged by chiropractor.  Follow-up here as needed.  Discussed with Dr. Patsy Lager.

## 2011-08-27 NOTE — Patient Instructions (Signed)
Please stop the ibuprofen and Aleve while taking the meloxicam.  Take the meloxicam at a different time of day as your aspirin.  The pain medication may cause sleepiness and constipation.  Taking it with food can eliminate the potential nausea.  Proceed with the MRI arrangements per the chiropractor.  Return for re-evaluation if your symptoms worsen or persist.

## 2011-08-27 NOTE — Progress Notes (Signed)
Agree with care by Theora Gianotti PA-C

## 2011-08-28 ENCOUNTER — Emergency Department (HOSPITAL_COMMUNITY)
Admission: EM | Admit: 2011-08-28 | Discharge: 2011-08-28 | Disposition: A | Discharge status: Home / Self Care | Payer: Self-pay | Attending: Emergency Medicine | Admitting: Emergency Medicine

## 2011-08-28 ENCOUNTER — Encounter (HOSPITAL_COMMUNITY): Payer: Self-pay | Admitting: *Deleted

## 2011-08-28 DIAGNOSIS — Z882 Allergy status to sulfonamides status: Secondary | ICD-10-CM | POA: Insufficient documentation

## 2011-08-28 DIAGNOSIS — E785 Hyperlipidemia, unspecified: Secondary | ICD-10-CM | POA: Insufficient documentation

## 2011-08-28 DIAGNOSIS — Z951 Presence of aortocoronary bypass graft: Secondary | ICD-10-CM | POA: Insufficient documentation

## 2011-08-28 DIAGNOSIS — I10 Essential (primary) hypertension: Secondary | ICD-10-CM | POA: Insufficient documentation

## 2011-08-28 DIAGNOSIS — M5432 Sciatica, left side: Secondary | ICD-10-CM

## 2011-08-28 DIAGNOSIS — I509 Heart failure, unspecified: Secondary | ICD-10-CM | POA: Insufficient documentation

## 2011-08-28 DIAGNOSIS — E119 Type 2 diabetes mellitus without complications: Secondary | ICD-10-CM | POA: Insufficient documentation

## 2011-08-28 DIAGNOSIS — Z87891 Personal history of nicotine dependence: Secondary | ICD-10-CM | POA: Insufficient documentation

## 2011-08-28 DIAGNOSIS — M5430 Sciatica, unspecified side: Secondary | ICD-10-CM | POA: Insufficient documentation

## 2011-08-28 DIAGNOSIS — I252 Old myocardial infarction: Secondary | ICD-10-CM | POA: Insufficient documentation

## 2011-08-28 MED ORDER — HYDROMORPHONE HCL PF 2 MG/ML IJ SOLN
2.0000 mg | Freq: Once | INTRAMUSCULAR | Status: AC
  Administered 2011-08-28: 2 mg via INTRAMUSCULAR
  Filled 2011-08-28: qty 1

## 2011-08-28 MED ORDER — PREDNISONE 20 MG PO TABS
60.0000 mg | ORAL_TABLET | Freq: Once | ORAL | Status: AC
  Administered 2011-08-28: 60 mg via ORAL
  Filled 2011-08-28: qty 3

## 2011-08-28 MED ORDER — OXYCODONE-ACETAMINOPHEN 5-325 MG PO TABS: 1.0000 | ORAL_TABLET | ORAL | Status: AC | PRN

## 2011-08-28 MED ORDER — DIAZEPAM 5 MG PO TABS: 5.0000 mg | ORAL_TABLET | Freq: Three times a day (TID) | ORAL | Status: AC | PRN

## 2011-08-28 MED ORDER — PREDNISONE 20 MG PO TABS: 40.0000 mg | ORAL_TABLET | Freq: Every day | ORAL | Status: AC

## 2011-08-28 MED ORDER — ONDANSETRON 4 MG PO TBDP
8.0000 mg | ORAL_TABLET | Freq: Once | ORAL | Status: AC
  Administered 2011-08-28: 8 mg via ORAL
  Filled 2011-08-28: qty 2

## 2011-08-28 MED ORDER — DIAZEPAM 5 MG PO TABS
5.0000 mg | ORAL_TABLET | Freq: Once | ORAL | Status: AC
  Administered 2011-08-28: 5 mg via ORAL
  Filled 2011-08-28: qty 1

## 2011-08-28 NOTE — Discharge Instructions (Signed)
Please review the instructions below. You were evaluated tonight for the pain in hyour left lower back that extends into left lower extremity. The likely source of this pain particularly the pain in the left lower extremity his called sciatica. Please review information below. We have given you medication for pain and started prednisone and you agree your feeling better. As discussed, the prednisone can cause your blood sugars to rise but we felt the benefit outweighs the risk given your level of discomfort. Monitor your blood sugars closely, watch your diet closely and drink plenty of water every day. Please return for worsening symptoms otherwise follow up with your doctor in [place] regarding the planned MRI.     Chronic Back Pain When back pain lasts longer than 3 months, it is called chronic back pain.This pain can be frustrating, but the cause of the pain is rarely dangerous.People with chronic back pain often go through certain periods that are more intense (flare-ups). CAUSES Chronic back pain can be caused by wear and tear (degeneration) on different structures in your back. These structures may include bones, ligaments, or discs. This degeneration may result in more pressure being placed on the nerves that travel to your legs and feet. This can lead to pain traveling from the low back down the back of the legs. When pain lasts longer than 3 months, it is not unusual for people to experience anxiety or depression. Anxiety and depression can also contribute to low back pain. TREATMENT  Establish a regular exercise plan. This is critical to improving your functional level.   Have a self-management plan for when you flare-up. Flare-ups rarely require a medical visit. Regular exercise will help reduce the intensity and frequency of your flare-ups.   Manage how you feel about your back pain and the rest of your life. Anxiety, depression, and feeling that you cannot alter your back pain have  been shown to make back pain more intense and debilitating.   Medicines should never be your only treatment. They should be used along with other treatments to help you return to a more active lifestyle.   Procedures such as injections or surgery may be helpful but are rarely necessary. You may be able to get the same results with physical therapy or chiropractic care.  HOME CARE INSTRUCTIONS  Avoid bending, heavy lifting, prolonged sitting, and activities which make the problem worse.   Continue normal activity as much as possible.   Take brief periods of rest throughout the day to reduce your pain during flare-ups.   Follow your back exercise rehabilitation program. This can help reduce symptoms and prevent more pain.   Only take over-the-counter or prescription medicines as directed by your caregiver. Muscle relaxants are sometimes prescribed. Narcotic pain medicine is discouraged for long-term pain, since addiction is a possible outcome.   If you smoke, quit.   Eat healthy foods and maintain a recommended body weight.  SEEK IMMEDIATE MEDICAL CARE IF:   You have weakness or numbness in one of your legs or feet.   You have trouble controlling your bladder or bowels.   You develop nausea, vomiting, abdominal pain, shortness of breath, or fainting.  Document Released: 07/04/2004 Document Revised: 05/16/2011 Document Reviewed: 05/11/2011 [facility] Patient Information 2012 [place], [place].Sciatica Sciatica is a name for lower back pain caused by pressure on the sciatic nerve. The back pain can spread to the buttocks and back of the leg. HOME CARE   Rest as much as possible.  Only take medicine as told by your doctor.   Apply cold or heat to your back as told by your doctor.   Do not bend, lift, or sit for a long time until your pain is better.   Do not do anything that makes the condition worse.   Start normal activity when the pain is better.   Keep all follow-up visits.    GET HELP RIGHT AWAY IF:   There is more pain.   There is weakness or numbness in the legs.   You cannot control when you poop (bowel movement) or pee (urinate).  MAKE SURE YOU:   Understand these instructions.   Will watch this condition.   Will get help right away if you are not doing well or get worse.  Document Released: 03/05/2008 Document Revised: 05/16/2011 Document Reviewed: 03/05/2008 [facility] Patient Information 2012 [place], [place].Radicular Pain Radicular pain in either the arm or leg is usually from a bulging or herniated disk in the spine. A piece of the herniated disk may press against the nerves as the nerves exit the spine. This causes pain which is felt at the tips of the nerves down the arm or leg. Other causes of radicular pain may include:  Fractures.   Heart disease.   Cancer.   An abnormal and usually degenerative state of the nervous system or nerves (neuropathy).  Diagnosis may require CT or MRI scanning to determine the primary cause.  Nerves that start at the neck (nerve roots) may cause radicular pain in the outer shoulder and arm. It can spread down to the thumb and fingers. The symptoms vary depending on which nerve root has been affected. In most cases radicular pain improves with conservative treatment. Neck problems may require physical therapy, a neck collar, or cervical traction. Treatment may take many weeks, and surgery may be considered if the symptoms do not improve.  Conservative treatment is also recommended for sciatica. Sciatica causes pain to radiate from the lower back or buttock area down the leg into the foot. Often there is a history of back problems. Most patients with sciatica are better after 2 to 4 weeks of rest and other supportive care. Short term bed rest can reduce the disk pressure considerably. Sitting, however, is not a good position since this increases the pressure on the disk. You should avoid bending, lifting, and all other  activities which make the problem worse. Traction can be used in severe cases. Surgery is usually reserved for patients who do not improve within the first months of treatment. Only take over-the-counter or prescription medicines for pain, discomfort, or fever as directed by your caregiver. Narcotics and muscle relaxants may help by relieving more severe pain and spasm and by providing mild sedation. Cold or massage can give significant relief. Spinal manipulation is not recommended. It can increase the degree of disc protrusion. Epidural steroid injections are often effective treatment for radicular pain. These injections deliver medicine to the spinal nerve in the space between the protective covering of the spinal cord and back bones (vertebrae). Your caregiver can give you more information about steroid injections. These injections are most effective when given within two weeks of the onset of pain.  You should see your caregiver for follow up care as recommended. A program for neck and back injury rehabilitation with stretching and strengthening exercises is an important part of management.  SEEK IMMEDIATE MEDICAL CARE IF:  You develop increased pain, weakness, or numbness in your arm or leg.  You develop difficulty with bladder or bowel control.   You develop abdominal pain.  Document Released: 07/04/2004 Document Revised: 05/16/2011 Document Reviewed: 09/19/2008 [facility] Patient Information 2012 [place], [place].

## 2011-08-28 NOTE — ED Notes (Signed)
Med given 

## 2011-08-28 NOTE — ED Notes (Signed)
The pt is c/o back pain  For 3-4 days.  He has been seeing a [facility] and he was told he needed a mri

## 2011-08-28 NOTE — ED Notes (Addendum)
Pt reports severe lower back pain radiating into left leg. Pt with hx of the same, states that he is unable to sleep because of discomfort. Pt states he also has chronic knee pain. Pt has back support brace on, states that seems to help with discomfort. Denies numbness or tingling to legs.

## 2011-08-28 NOTE — ED Provider Notes (Signed)
History     CSN: [id number]  Arrival date & time 08/28/11  1508   First MD Initiated Contact with Patient 08/28/11 2016      Chief Complaint  Patient presents with  . Back Pain    (Consider location/radiation/quality/duration/timing/severity/associated sxs/prior treatment) Patient is a 61 y.o. male presenting with back pain. The history is provided by the patient.  Back Pain  This is a recurrent problem. The current episode started more than 1 week ago. The problem occurs constantly. The problem has been gradually worsening. The pain is associated with twisting. The pain is present in the lumbar spine. The quality of the pain is described as shooting and stabbing. The pain radiates to the left thigh and left knee. The pain is at a severity of 6/10. The pain is moderate. The symptoms are aggravated by certain positions. The pain is the same all the time. Associated symptoms include numbness, leg pain and tingling. Pertinent negatives include no bowel incontinence, no perianal numbness, no bladder incontinence, no dysuria, no paresthesias, no paresis and no weakness. The treatment provided mild relief.  the patient reports persistent and worsening left lower back pain x1 week. Denies any injury and admits that this is a recurrent problem for him. Pain now radiating into the left leg to the left knee and is associated with intermittent numbness and tingling. Patient is ambulatory and there is no weakness to the left lower extremity. States he's been going to his chiropractor for this  and there has been no improvement. This physician in [place] is in the process of scheduling an outpatient MRI for the patient but the pain medicine he was given is not helping.   Past Medical History  Diagnosis Date  . Hypertension   . CHF (congestive heart failure)   . Dyspnea   . Myocardial infarction 2004  . Diabetes mellitus   . Hyperlipidemia     Past Surgical History  Procedure Date  . Coronary artery  bypass graft     x4; stents to diagonal a mid right coronary lesion; drug eluting stent in the mid RCA as well as ostium of the diagonal with a cutting balloon angioplasty of the diagonal.    Family History  Problem Relation Age of Onset  . Heart disease Mother   . Heart disease Father     History  Substance Use Topics  . Smoking status: Former Games developer  . Smokeless tobacco: Not on file  . Alcohol Use: No      Review of Systems  Constitutional: Negative.   HENT: Negative.   Eyes: Negative.   Respiratory: Negative.   Cardiovascular: Negative.   Gastrointestinal: Negative.  Negative for bowel incontinence.  Genitourinary: Negative.  Negative for bladder incontinence and dysuria.  Musculoskeletal: Positive for back pain.  Skin: Negative.   Neurological: Positive for tingling and numbness. Negative for weakness and paresthesias.  Hematological: Negative.   Psychiatric/Behavioral: Negative.     Allergies  Sulfa antibiotics  Home Medications   Current Outpatient Rx  Name Route Sig Dispense Refill  . AMLODIPINE BESYLATE 10 MG PO TABS Oral Take 10 mg by mouth daily.      . ASPIRIN 325 MG PO TBEC Oral Take 325 mg by mouth daily.      Marland Kitchen HYDROCODONE-ACETAMINOPHEN 5-325 MG PO TABS Oral Take 1 tablet by mouth every 6 (six) hours as needed for pain. 30 tablet 0  . MELOXICAM 7.5 MG PO TABS Oral Take 1 tablet (7.5 mg total) by mouth daily.  30 tablet 0  . METFORMIN HCL ER 500 MG PO TB24 Oral Take 1,000 mg by mouth 2 (two) times daily.    Marland Kitchen PRAVASTATIN SODIUM 40 MG PO TABS Oral Take 40 mg by mouth daily.    Marland Kitchen TAMSULOSIN HCL 0.4 MG PO CAPS Oral Take 0.4 mg by mouth daily.      BP 148/89  Pulse 95  Temp(Src) 97.6 F (36.4 C) (Oral)  Resp 12  SpO2 96%  Physical Exam  Constitutional: He is oriented to person, place, and time. He appears well-developed and well-nourished.  HENT:  Head: Normocephalic and atraumatic.  Eyes: Conjunctivae are normal.  Cardiovascular: Normal rate.     Pulmonary/Chest: Effort normal.  Musculoskeletal: Normal range of motion.  Neurological: He is alert and oriented to person, place, and time. He has normal reflexes.  Skin: Skin is warm and dry.  Psychiatric: He has a normal mood and affect.    ED Course  Procedures   Patient reports pain is much improved after medication. We'll plan to discharge home with short course of Percocet, muscle relaxants and prednisone and encourage followup with his physician in [place] regarding his MRI. Patient agreeable with plan.  Labs Reviewed - No data to display No results found.   No diagnosis found.    MDM  HPI/PE and clinical findings c/w 1.Sciatica (L)  (patient is without focal neurological deficits, cauda equina syndrome considered but not likely given exam, pain much improved with medication)   Medical screening examination/treatment/procedure(s) were performed by non-physician practitioner and as supervising physician I was immediately available for consultation/collaboration. Osvaldo Human, M.D.    And  Leanne Chang, NP 08/28/11 2158  Carleene Cooper III, MD 08/30/11 [phone]

## 2011-09-04 ENCOUNTER — Telehealth: Payer: Self-pay | Admitting: Cardiology

## 2011-09-04 NOTE — Telephone Encounter (Signed)
Amber called back,said for me to hold onto the ROI as for the Pt is only going to [facility] for A consultation, and if she needed records she would let me know. 09/04/11/KM

## 2011-09-04 NOTE — Telephone Encounter (Signed)
Pt came in this am signed ROI to have Records faxed to Dr.Hadar With [facility] is having back Surgery there, pt was unsure of What records Needed to be sent or if Dr.Hadar needed a Note stating pt is ok to have the Surgery, I have called Left Msg with Chandra Batch RN, for Dr.Hadar,will await her Phone call 09/04/11/KM

## 2011-09-06 ENCOUNTER — Other Ambulatory Visit: Payer: Self-pay | Admitting: Cardiology

## 2011-09-09 ENCOUNTER — Other Ambulatory Visit: Payer: Self-pay | Admitting: Family Medicine

## 2011-09-15 ENCOUNTER — Ambulatory Visit: Payer: Self-pay | Admitting: Family Medicine

## 2011-09-15 VITALS — BP 159/84 | HR 89 | Temp 98.1°F | Resp 16 | Ht 72.38 in | Wt 228.6 lb

## 2011-09-15 DIAGNOSIS — R609 Edema, unspecified: Secondary | ICD-10-CM

## 2011-09-15 DIAGNOSIS — M5430 Sciatica, unspecified side: Secondary | ICD-10-CM

## 2011-09-15 DIAGNOSIS — M5432 Sciatica, left side: Secondary | ICD-10-CM

## 2011-09-15 DIAGNOSIS — R600 Localized edema: Secondary | ICD-10-CM

## 2011-09-15 MED ORDER — OXYCODONE-ACETAMINOPHEN 10-325 MG PO TABS: 1.0000 | ORAL_TABLET | Freq: Three times a day (TID) | ORAL | Status: DC | PRN

## 2011-09-15 MED ORDER — FUROSEMIDE 20 MG PO TABS: 20.0000 mg | ORAL_TABLET | Freq: Every day | ORAL | Status: DC

## 2011-09-15 NOTE — Progress Notes (Signed)
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 He initially went to see a chiropractor but after 3 manipulations his back was excruciating. He contact his doctor in [place] and got an MRI which he brought in today. Unfortunately we're unable to read the MRI on her computer. He is scheduled to see his neurosurgeon on Tuesday in [place]. His pain has been excruciating and despite a shot of cortisone in the emergency department and Percocet 5 mg, he's had no relief.  He also has bilateral pedal edema over this time. He's had no shortness of breath or cough.  Objective: Vision in obvious pain leaning to the right, he came in with crutches  HEENT unremarkable  Chest: Clear  Straight leg raising, positive on left with decreased reflexes bilaterally and symmetrically. He has normal sensation of the left leg and normal movement  Assessment acute sciatica and pedal edema  Plan: Lasix 20 mg daily, Percocet 10/325 3 times a day when necessary, followup neurosurgery visit on Tuesday

## 2011-09-23 ENCOUNTER — Encounter (INDEPENDENT_AMBULATORY_CARE_PROVIDER_SITE_OTHER): Payer: Self-pay

## 2011-09-23 ENCOUNTER — Ambulatory Visit: Payer: Self-pay | Admitting: Family Medicine

## 2011-09-23 VITALS — BP 112/63 | HR 97 | Temp 98.0°F | Resp 20 | Ht 72.0 in | Wt 221.0 lb

## 2011-09-23 DIAGNOSIS — E119 Type 2 diabetes mellitus without complications: Secondary | ICD-10-CM

## 2011-09-23 DIAGNOSIS — R609 Edema, unspecified: Secondary | ICD-10-CM

## 2011-09-23 DIAGNOSIS — M79609 Pain in unspecified limb: Secondary | ICD-10-CM

## 2011-09-23 DIAGNOSIS — M79606 Pain in leg, unspecified: Secondary | ICD-10-CM

## 2011-09-23 DIAGNOSIS — M1710 Unilateral primary osteoarthritis, unspecified knee: Secondary | ICD-10-CM

## 2011-09-23 DIAGNOSIS — M25569 Pain in unspecified knee: Secondary | ICD-10-CM

## 2011-09-23 DIAGNOSIS — M25562 Pain in left knee: Secondary | ICD-10-CM

## 2011-09-23 DIAGNOSIS — IMO0002 Reserved for concepts with insufficient information to code with codable children: Secondary | ICD-10-CM

## 2011-09-23 DIAGNOSIS — I509 Heart failure, unspecified: Secondary | ICD-10-CM

## 2011-09-23 DIAGNOSIS — M1712 Unilateral primary osteoarthritis, left knee: Secondary | ICD-10-CM

## 2011-09-23 LAB — COMPREHENSIVE METABOLIC PANEL
ALT: 25 U/L (ref 0–53)
AST: 20 U/L (ref 0–37)
Alkaline Phosphatase: 63 U/L (ref 39–117)
Sodium: 141 mEq/L (ref 135–145)
Total Bilirubin: 0.7 mg/dL (ref 0.3–1.2)
Total Protein: 6.9 g/dL (ref 6.0–8.3)

## 2011-09-23 MED ORDER — OXYCODONE-ACETAMINOPHEN 10-325 MG PO TABS: 1.0000 | ORAL_TABLET | Freq: Three times a day (TID) | ORAL | Status: AC | PRN

## 2011-09-23 NOTE — Patient Instructions (Signed)
Referral Department should contact you with regard to the orthopedic referral in [facility]  Ultrasound of legs today Elevate legs as able. Avoid excessive salt. Increase Lasix to 40 mg daily (2x20) Decrease amlodipine to 5 mg daily (one half of 10 mg)

## 2011-09-23 NOTE — Progress Notes (Signed)
Subjective:  Patient is here complaining of edema. He has friends of Dr. Midge Aver in [place], who recommended that he see an orthopedic friend named Dr.Oldcott, to check out his left knee which is gaping giving him troubles. In addition to that he was recommended to come here and get checked for blood clots in his legs.  He's been having to sit in a chair because of his sciatica pain which continues to persist. He is scheduled to go to a doctor in [facility] Monday for injection of his spine.  Taking Lasix has helped the swelling a little bit.  No chest pains or breathing difficulties.  In worsening needs more pain medications.  Remainder of review of systems was unremarkable.  Objective Chest clear heart regular without murmurs soft nontender bilateral leg edema four plus to the knees. Pedal pulses are present. Negative Homans sign. This at some length are tender. Straight leg raising test seated does cause pain. Due to his old records. He does have some renal insufficiency and a couple of years ago, but renal function has been good.  Glucose in the 170s.  Assessment: Bilateral leg pain and edema Left knee pain and DJD Pretension Diabetes History of CHF Adverse effect of medication  Plan: Try not to keep his legs pain down all the time. Avoid excessive salt. Increase diuretic and decrease amlodipine. Check ultrasound to make sure he does not have a clot, but I think that is unlikely. Make a referral for him to the orthopedist in [facility].  Return in 2 weeks for recheck.  Over 45 min spent with direct patient care.

## 2011-09-27 ENCOUNTER — Encounter: Payer: Self-pay | Admitting: Family Medicine

## 2011-11-14 ENCOUNTER — Other Ambulatory Visit: Payer: Self-pay | Admitting: Family Medicine

## 2011-12-01 ENCOUNTER — Other Ambulatory Visit: Payer: Self-pay | Admitting: Family Medicine

## 2011-12-21 ENCOUNTER — Other Ambulatory Visit: Payer: Self-pay | Admitting: Family Medicine

## 2011-12-21 ENCOUNTER — Other Ambulatory Visit: Payer: Self-pay | Admitting: Physician Assistant

## 2011-12-27 ENCOUNTER — Other Ambulatory Visit: Payer: Self-pay | Admitting: Physician Assistant

## 2012-04-16 DIAGNOSIS — N400 Enlarged prostate without lower urinary tract symptoms: Secondary | ICD-10-CM | POA: Diagnosis present

## 2012-05-23 ENCOUNTER — Ambulatory Visit: Payer: Self-pay | Admitting: Emergency Medicine

## 2012-05-23 VITALS — BP 131/81 | HR 82 | Temp 94.6°F | Resp 18 | Ht 73.5 in | Wt 211.2 lb

## 2012-05-23 DIAGNOSIS — I10 Essential (primary) hypertension: Secondary | ICD-10-CM

## 2012-05-23 MED ORDER — LISINOPRIL 10 MG PO TABS: ORAL_TABLET | ORAL | Status: DC

## 2012-05-23 NOTE — Progress Notes (Signed)
  Subjective:    Patient ID: Shane Holloway, male    DOB: Feb 19, 1951, 61 y.o.   MRN: [id number]  HPI patient in for checkup year. 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 That 8 years ago he an episode of chest pain and presented here ended up with a coronary artery bypass graft surgery he has recently been placed on blood pressure medications. He is a diabetic and has get his sugar under control with a blood sugar checked this morning of 112. Last night he checked his blood pressure and had a systolic pressure 170. He took an extra lisinopril last night and then this morning took 2 pills. He enters today for blood pressure check.    Review of Systems     Objective:   Physical Exam patient is alert and cooperative does not appear in any distress. Neck is supple. Chest was clear to both auscultation and percussion. There is a midline scar over the chest. Consistent with his previous bypass surgery        Assessment & Plan:  Patient is stable at present. We'll go ahead and increase his lisinopril from 5 mg to 10 mg a day. He will followup with his doctor at home . I rechecked blood pressure is right on and it was 110/70 blood pressure in the left arm seated was 112/72 .

## 2012-06-10 DIAGNOSIS — Z0271 Encounter for disability determination: Secondary | ICD-10-CM

## 2012-10-21 ENCOUNTER — Ambulatory Visit: Payer: Self-pay | Admitting: Emergency Medicine

## 2012-10-21 VITALS — BP 134/74 | HR 70 | Temp 97.4°F | Resp 18 | Ht 73.0 in | Wt 226.0 lb

## 2012-10-21 DIAGNOSIS — R42 Dizziness and giddiness: Secondary | ICD-10-CM

## 2012-10-21 DIAGNOSIS — I10 Essential (primary) hypertension: Secondary | ICD-10-CM

## 2012-10-21 DIAGNOSIS — E119 Type 2 diabetes mellitus without complications: Secondary | ICD-10-CM

## 2012-10-21 LAB — POCT CBC
Granulocyte percent: 66.9 %G (ref 37–80)
HCT, POC: 43.8 % (ref 43.5–53.7)
Hemoglobin: 14.4 g/dL (ref 14.1–18.1)
Lymph, poc: 1.6 (ref 0.6–3.4)
MCHC: 32.9 g/dL (ref 31.8–35.4)
MCV: 89.1 fL (ref 80–97)
POC LYMPH PERCENT: 26.1 %L (ref 10–50)
RDW, POC: 14.5 %

## 2012-10-21 NOTE — Patient Instructions (Addendum)

## 2012-10-21 NOTE — Progress Notes (Signed)
[facility] [address] [place] [address] [phone]  Date:  10/21/2012   Name:  Shane Holloway   DOB:  [birth year]   MRN:  [id number]  PCP:  Tally Due, MD    Chief Complaint: Dizziness   History of Present Illness:  Shane Holloway is a 62 y.o. very pleasant male patient who presents with the following:  Having orthostatic dizziness since Sunday.  Mostly when stands up but it persists most of the day. Feels lightheaded while sitting but when he gets up it is worse.  Does not seem to be related to force of exertion.  Not associated with shortness of breath, wheezing, chest pain, facial asymmetry, expressive difficulty, difficulty with vision or balance.  No peripheral weakness, nausea, vomiting, or diaphoresis.  No sensation of palpitations or tachycardia.  No headache or antecedent illness or injury.  No improvement with over the counter medications or other home remedies. Denies other complaint or health concern today. History of DM, HBP, CABG, CHF, reformed smoker.  Patient Active Problem List   Diagnosis Date Noted  . DM 06/27/2009  . CORONARY ATHEROSCLEROSIS, ARTERY BYPASS GRAFT 06/27/2009  . HYPERTENSION 11/24/2008  . CHF 11/24/2008  . DYSPNEA 11/24/2008    Past Medical History  Diagnosis Date  . Hypertension   . CHF (congestive heart failure)   . Dyspnea   . Myocardial infarction 2004  . Diabetes mellitus   . Hyperlipidemia   . Allergy     Past Surgical History  Procedure Laterality Date  . Coronary artery bypass graft      x4; stents to diagonal a mid right coronary lesion; drug eluting stent in the mid RCA as well as ostium of the diagonal with a cutting balloon angioplasty of the diagonal.  . Joint replacement      History  Substance Use Topics  . Smoking status: Former Games developer  . Smokeless tobacco: Not on file  . Alcohol Use: No    Family History  Problem Relation Age of Onset  . Heart disease Mother   . Heart  disease Father     Allergies  Allergen Reactions  . Sulfa Antibiotics Hives    Medication list has been reviewed and updated.  Current Outpatient Prescriptions on File Prior to Visit  Medication Sig Dispense Refill  . amLODipine (NORVASC) 10 MG tablet TAKE ONE TABLET BY MOUTH EVERY DAY  30 tablet  1  . aspirin 325 MG EC tablet Take 325 mg by mouth daily.        Marland Kitchen glipiZIDE (GLUCOTROL) 5 MG tablet Take 5 mg by mouth 1 day or 1 dose.      Marland Kitchen lisinopril (PRINIVIL,ZESTRIL) 10 MG tablet Take one tablet daily for blood pressure control  30 tablet  11  . metFORMIN (GLUCOPHAGE-XR) 500 MG 24 hr tablet TAKE TWO TABLETS BY MOUTH EVERY DAY IN THE MORNING, AND TAKE ONE TABLET EVERY DAY IN THE EVENING **RETURN TO CLINIC BEFORE OUT**  270 tablet  0  . metoprolol tartrate (LOPRESSOR) 25 MG tablet Take 25 mg by mouth 2 (two) times daily.      . pravastatin (PRAVACHOL) 40 MG tablet TAKE ONE TABLET BY MOUTH EVERY DAY FOR CHOLESTEROL  30 tablet  2  . amLODipine (NORVASC) 10 MG tablet TAKE ONE TABLET BY MOUTH EVERY DAY  30 tablet  5  . furosemide (LASIX) 20 MG tablet Take 1 tablet (20 mg total) by mouth daily.  30 tablet  3  .  Tamsulosin HCl (FLOMAX) 0.4 MG CAPS Take 0.4 mg by mouth daily.      . Tamsulosin HCl (FLOMAX) 0.4 MG CAPS TAKE ONE CAPSULE BY MOUTH EVERY DAY  30 capsule  2  . [DISCONTINUED] atorvastatin (LIPITOR) 80 MG tablet Take 80 mg by mouth daily.        . [DISCONTINUED] losartan (COZAAR) 50 MG tablet Take 50 mg by mouth daily.         No current facility-administered medications on file prior to visit.    Review of Systems:  As per HPI, otherwise negative.    Physical Examination: Filed Vitals:   10/21/12 1313  BP: 134/74  Pulse: 70  Temp: 97.4 F (36.3 C)  Resp: 18   Filed Vitals:   10/21/12 1313  Height: 6\' 1"  (1.854 m)  Weight: 226 lb (102.513 kg)   Body mass index is 29.82 kg/(m^2). Ideal Body Weight: Weight in (lb) to have BMI = 25: 189.1  GEN: WDWN, NAD, Non-toxic, A  & O x 3 HEENT: Atraumatic, Normocephalic. Neck supple. No masses, No LAD. Ears and Nose: No external deformity. CV: RRR, No M/G/R. No JVD. No thrill. No extra heart sounds. PULM: CTA B, no wheezes, crackles, rhonchi. No retractions. No resp. distress. No accessory muscle use. ABD: S, NT, ND, +BS. No rebound. No HSM. EXTR: No c/c/e NEURO Normal gait. Normal heel walk, toe walk, and tandem gait.  Normal romberg PSYCH: Normally interactive. Conversant. Not depressed or anxious appearing.  Calm demeanor.    Assessment and Plan: Dizziness Labs EKG Refer to ER but refused transport.  Elects to go to [facility] hospital under his own power. Signed,  Phillips Odor, MD   Results for orders placed in visit on 10/21/12  POCT CBC      Result Value Range   WBC 6.2  4.6 - 10.2 K/uL   Lymph, poc 1.6  0.6 - 3.4   POC LYMPH PERCENT 26.1  10 - 50 %L   MID (cbc) 0.4  0 - 0.9   POC MID % 7.0  0 - 12 %M   POC Granulocyte 4.1  2 - 6.9   Granulocyte percent 66.9  37 - 80 %G   RBC 4.92  4.69 - 6.13 M/uL   Hemoglobin 14.4  14.1 - 18.1 g/dL   HCT, POC 16.1  09.6 - 53.7 %   MCV 89.1  80 - 97 fL   MCH, POC 29.3  27 - 31.2 pg   MCHC 32.9  31.8 - 35.4 g/dL   RDW, POC 04.5     Platelet Count, POC 190  142 - 424 K/uL   MPV 8.9  0 - 99.8 fL

## 2013-06-10 HISTORY — PX: AORTIC VALVE REPLACEMENT (AVR)/CORONARY ARTERY BYPASS GRAFTING (CABG): SHX5725

## 2014-02-22 DIAGNOSIS — Z8673 Personal history of transient ischemic attack (TIA), and cerebral infarction without residual deficits: Secondary | ICD-10-CM

## 2017-04-20 DIAGNOSIS — N183 Chronic kidney disease, stage 3 unspecified: Secondary | ICD-10-CM | POA: Diagnosis present

## 2020-09-08 DIAGNOSIS — I639 Cerebral infarction, unspecified: Secondary | ICD-10-CM

## 2020-09-08 HISTORY — DX: Cerebral infarction, unspecified: I63.9

## 2020-09-13 DIAGNOSIS — F0150 Vascular dementia without behavioral disturbance: Secondary | ICD-10-CM | POA: Diagnosis present

## 2020-11-24 ENCOUNTER — Emergency Department (HOSPITAL_COMMUNITY)
Admission: EM | Admit: 2020-11-24 | Discharge: 2020-11-25 | Disposition: A | Discharge status: Home / Self Care | Payer: Medicare Other | Attending: Emergency Medicine | Admitting: Emergency Medicine

## 2020-11-24 DIAGNOSIS — R0602 Shortness of breath: Secondary | ICD-10-CM | POA: Diagnosis not present

## 2020-11-24 DIAGNOSIS — Z5321 Procedure and treatment not carried out due to patient leaving prior to being seen by health care provider: Secondary | ICD-10-CM | POA: Diagnosis not present

## 2020-11-24 DIAGNOSIS — R42 Dizziness and giddiness: Secondary | ICD-10-CM | POA: Diagnosis not present

## 2020-11-24 NOTE — ED Notes (Signed)
Pt left AMA before being called for triage.

## 2020-12-17 ENCOUNTER — Emergency Department (HOSPITAL_COMMUNITY)
Admission: EM | Admit: 2020-12-17 | Discharge: 2020-12-18 | Disposition: A | Discharge status: Home / Self Care | Payer: Medicare Other | Attending: Emergency Medicine | Admitting: Emergency Medicine

## 2020-12-17 ENCOUNTER — Other Ambulatory Visit: Payer: Self-pay

## 2020-12-17 ENCOUNTER — Encounter (HOSPITAL_COMMUNITY): Payer: Self-pay | Admitting: Emergency Medicine

## 2020-12-17 ENCOUNTER — Emergency Department (HOSPITAL_COMMUNITY): Payer: Medicare Other

## 2020-12-17 DIAGNOSIS — E119 Type 2 diabetes mellitus without complications: Secondary | ICD-10-CM | POA: Insufficient documentation

## 2020-12-17 DIAGNOSIS — I110 Hypertensive heart disease with heart failure: Secondary | ICD-10-CM | POA: Diagnosis not present

## 2020-12-17 DIAGNOSIS — R0789 Other chest pain: Secondary | ICD-10-CM | POA: Diagnosis not present

## 2020-12-17 DIAGNOSIS — R0600 Dyspnea, unspecified: Secondary | ICD-10-CM | POA: Insufficient documentation

## 2020-12-17 DIAGNOSIS — Z87891 Personal history of nicotine dependence: Secondary | ICD-10-CM | POA: Insufficient documentation

## 2020-12-17 DIAGNOSIS — Z951 Presence of aortocoronary bypass graft: Secondary | ICD-10-CM | POA: Diagnosis not present

## 2020-12-17 DIAGNOSIS — Z7984 Long term (current) use of oral hypoglycemic drugs: Secondary | ICD-10-CM | POA: Insufficient documentation

## 2020-12-17 DIAGNOSIS — Z79899 Other long term (current) drug therapy: Secondary | ICD-10-CM | POA: Diagnosis not present

## 2020-12-17 DIAGNOSIS — I509 Heart failure, unspecified: Secondary | ICD-10-CM | POA: Insufficient documentation

## 2020-12-17 DIAGNOSIS — R079 Chest pain, unspecified: Secondary | ICD-10-CM

## 2020-12-17 DIAGNOSIS — Z7982 Long term (current) use of aspirin: Secondary | ICD-10-CM | POA: Insufficient documentation

## 2020-12-17 LAB — BASIC METABOLIC PANEL
Anion gap: 8 (ref 5–15)
BUN: 48 mg/dL — ABNORMAL HIGH (ref 8–23)
CO2: 23 mmol/L (ref 22–32)
Calcium: 9.9 mg/dL (ref 8.9–10.3)
Chloride: 107 mmol/L (ref 98–111)
Creatinine, Ser: 2.73 mg/dL — ABNORMAL HIGH (ref 0.61–1.24)
GFR, Estimated: 24 mL/min — ABNORMAL LOW (ref >60)
Glucose, Bld: 175 mg/dL — ABNORMAL HIGH (ref 70–99)
Potassium: 4.4 mmol/L (ref 3.5–5.1)
Sodium: 138 mmol/L (ref 135–145)

## 2020-12-17 LAB — TROPONIN I (HIGH SENSITIVITY): Troponin I (High Sensitivity): 14 ng/L (ref <18)

## 2020-12-17 LAB — CBC
HCT: 45 % (ref 39.0–52.0)
Hemoglobin: 14.9 g/dL (ref 13.0–17.0)
MCH: 29 pg (ref 26.0–34.0)
MCHC: 33.1 g/dL (ref 30.0–36.0)
MCV: 87.5 fL (ref 80.0–100.0)
Platelets: DECREASED 10*3/uL (ref 150–400)
RBC: 5.14 MIL/uL (ref 4.22–5.81)
RDW: 13.5 % (ref 11.5–15.5)
WBC: 5.6 10*3/uL (ref 4.0–10.5)
nRBC: 0 % (ref 0.0–0.2)

## 2020-12-17 NOTE — ED Triage Notes (Addendum)
Pt BIB [facility] c/o Iintermittent CP for past couple of days, central, non-radiating, with SOB. Rating pain 3/10 on onset but no pain at this time. HX MI April, stroke 3 weeks ago. No visual distress. HX CHF no swelling.

## 2020-12-18 LAB — TROPONIN I (HIGH SENSITIVITY): Troponin I (High Sensitivity): 13 ng/L (ref <18)

## 2020-12-18 LAB — BRAIN NATRIURETIC PEPTIDE: B Natriuretic Peptide: 62.6 pg/mL (ref 0.0–100.0)

## 2020-12-18 LAB — D-DIMER, QUANTITATIVE: D-Dimer, Quant: 0.38 ug/mL-FEU (ref 0.00–0.50)

## 2020-12-18 NOTE — ED Notes (Signed)
Discharge instructions discussed with pt. Pt verbalized understanding. Pt stable and ambulatory. No signature pad available. 

## 2020-12-18 NOTE — ED Provider Notes (Signed)
[facility] EMERGENCY DEPARTMENT Provider Note   CSN: [id number] Arrival date & time: 12/17/20  2102     History Chief Complaint  Patient presents with   Chest Pain    Shane Holloway is a 70 y.o. male.  Patient with medical problems documented below who presents emerged from today with a few days of intermittent chest pain.  Patient states that it comes and goes it central nonradiating and associated dyspnea.  Patient does not state that severe.  States that the patient has no lower extremity swelling at this time.  He also states he has no chest pain this time.  He has no recent fevers or cough.  He was recently hospitalized overnight for an acute embolic stroke.  He is currently completing work-up for that.  He is not on any anticoagulants at this time.  It is unclear as to where those clots came from.   Chest Pain     Past Medical History:  Diagnosis Date   Allergy    CHF (congestive heart failure) ([place])    Diabetes mellitus    Dyspnea    Hyperlipidemia    Hypertension    Myocardial infarction [facility]) 2004    Patient Active Problem List   Diagnosis Date Noted   DM 06/27/2009   CORONARY ATHEROSCLEROSIS, ARTERY BYPASS GRAFT 06/27/2009   HYPERTENSION 11/24/2008   CHF 11/24/2008   DYSPNEA 11/24/2008    Past Surgical History:  Procedure Laterality Date   CORONARY ARTERY BYPASS GRAFT     x4; stents to diagonal a mid right coronary lesion; drug eluting stent in the mid RCA as well as ostium of the diagonal with a cutting balloon angioplasty of the diagonal.   JOINT REPLACEMENT         Family History  Problem Relation Age of Onset   Heart disease Mother    Heart disease Father     Social History   Tobacco Use   Smoking status: Former    Pack years: 0.00  Substance Use Topics   Alcohol use: No   Drug use: No    Home Medications Prior to Admission medications   Medication Sig Start Date End Date Taking? Authorizing Provider   amLODipine (NORVASC) 10 MG tablet TAKE ONE TABLET BY MOUTH EVERY DAY 09/06/11   Wall, Marijo Conception, MD  amLODipine (NORVASC) 10 MG tablet TAKE ONE TABLET BY MOUTH EVERY DAY 09/09/11   Kemper Durie, PA-C  aspirin 325 MG EC tablet Take 325 mg by mouth daily.      [provider]  furosemide (LASIX) 20 MG tablet Take 1 tablet (20 mg total) by mouth daily. 09/15/11 09/14/12  Robyn Haber, MD  glipiZIDE (GLUCOTROL) 5 MG tablet Take 5 mg by mouth 1 day or 1 dose.    [provider]  lisinopril (PRINIVIL,ZESTRIL) 10 MG tablet Take one tablet daily for blood pressure control 05/23/12   Darlyne Russian, MD  metFORMIN (GLUCOPHAGE-XR) 500 MG 24 hr tablet TAKE TWO TABLETS BY MOUTH EVERY DAY IN THE MORNING, AND TAKE ONE TABLET EVERY DAY IN THE EVENING **RETURN TO CLINIC BEFORE OUT** 12/21/11   Harrison Mons, PA  metoprolol tartrate (LOPRESSOR) 25 MG tablet Take 25 mg by mouth 2 (two) times daily.    [provider]  pravastatin (PRAVACHOL) 40 MG tablet TAKE ONE TABLET BY MOUTH EVERY DAY FOR CHOLESTEROL 06/12/06   Beatriz Chancellor, PA-C  Tamsulosin HCl (FLOMAX) 0.4 MG CAPS Take 0.4 mg by mouth daily.  [provider]  Tamsulosin HCl (FLOMAX) 0.4 MG CAPS TAKE ONE CAPSULE BY MOUTH EVERY DAY 12/27/11   Harrison Mons, PA  atorvastatin (LIPITOR) 80 MG tablet Take 80 mg by mouth daily.    08/28/11  [provider]  losartan (COZAAR) 50 MG tablet Take 50 mg by mouth daily.    08/28/11  [provider]    Allergies    Ramipril and Sulfa antibiotics  Review of Systems   Review of Systems  Cardiovascular:  Positive for chest pain.  All other systems reviewed and are negative.  Physical Exam Updated Vital Signs BP (!) 156/102   Pulse 61   Temp 97.9 F (36.6 C) (Oral)   Resp 16   Ht 6\' 1"  (1.854 m)   Wt 102.5 kg   SpO2 97%   BMI 29.82 kg/m   Physical Exam Vitals and nursing note reviewed.  Constitutional:      Appearance: He is well-developed.  HENT:      Head: Normocephalic and atraumatic.  Cardiovascular:     Rate and Rhythm: Normal rate.  Pulmonary:     Effort: Pulmonary effort is normal. No respiratory distress.  Chest:     Chest wall: No mass or tenderness.  Abdominal:     General: There is no distension.     Palpations: Abdomen is soft.  Musculoskeletal:        General: Normal range of motion.     Cervical back: Normal range of motion.  Skin:    General: Skin is warm and dry.  Neurological:     Mental Status: He is alert.    ED Results / Procedures / Treatments   Labs (all labs ordered are listed, but only abnormal results are displayed) Labs Reviewed  BASIC METABOLIC PANEL - Abnormal; Notable for the following components:      Result Value   Glucose, Bld 175 (*)    BUN 48 (*)    Creatinine, Ser 2.73 (*)    GFR, Estimated 24 (*)    All other components within normal limits  CBC  BRAIN NATRIURETIC PEPTIDE  D-DIMER, QUANTITATIVE  TROPONIN I (HIGH SENSITIVITY)  TROPONIN I (HIGH SENSITIVITY)    EKG EKG Interpretation  Date/Time:  Sunday December 17 2020 20:58:48 EDT Ventricular Rate:  62 PR Interval:  206 QRS Duration: 106 QT Interval:  438 QTC Calculation: 444 R Axis:   -30 Text Interpretation: Normal sinus rhythm Left axis deviation Possible Anterior infarct , age undetermined ST & T wave abnormality, consider inferolateral ischemia Abnormal ECG Confirmed by Merrily Pew [id number]) on 12/18/2020 12:34:13 AM  Radiology DG Chest 2 View  Result Date: 12/17/2020 CLINICAL DATA:  70 year old male with chest pain. EXAM: CHEST - 2 VIEW COMPARISON:  Chest radiograph dated 06/02/2009 FINDINGS: The lungs are clear. There is no pleural effusion pneumothorax. Mild cardiomegaly. Median sternotomy wires and mechanical cardiac valve. Atherosclerotic calcification of the aorta. No acute osseous pathology. IMPRESSION: No active cardiopulmonary disease. Electronically Signed   By: Anner Crete M.D.   On: 12/17/2020 22:09     Procedures Procedures   Medications Ordered in ED Medications - No data to display  ED Course  I have reviewed the triage vital signs and the nursing notes.  Pertinent labs & imaging results that were available during my care of the patient were reviewed by me and considered in my medical decision making (see chart for details).    MDM Rules/Calculators/A&P  Patient certainly at risk for multiple etiologies for chest discomfort and dyspnea.  His EKG and troponins were unremarkable making ACS unlikely.  He has no clinical symptoms or findings of pneumonia and a clear chest x-ray.  He has no clinical symptoms or findings of fluid overload and his BNP and chest x-ray are normal.  He has a couple risk factors for PE but his D-dimer is negative and low Wells score so no further work-up needed on that front.  His blood pressure is slightly elevated but that could be related?  Will defer to PCP to make indicated medication changes as is not that high.  Final Clinical Impression(s) / ED Diagnoses Final diagnoses:  Nonspecific chest pain  Dyspnea, unspecified type    Rx / DC Orders ED Discharge Orders     None        Azari Hasler, Corene Cornea, MD 12/18/20 [phone]

## 2021-02-22 DIAGNOSIS — Z953 Presence of xenogenic heart valve: Secondary | ICD-10-CM | POA: Insufficient documentation

## 2021-03-20 ENCOUNTER — Other Ambulatory Visit: Payer: Self-pay

## 2021-03-20 ENCOUNTER — Emergency Department (HOSPITAL_COMMUNITY): Payer: Medicare Other

## 2021-03-20 ENCOUNTER — Observation Stay (HOSPITAL_COMMUNITY)
Admission: EM | Admit: 2021-03-20 | Discharge: 2021-03-21 | Disposition: A | Discharge status: Home / Self Care | Payer: Medicare Other | Attending: Internal Medicine | Admitting: Internal Medicine

## 2021-03-20 ENCOUNTER — Encounter (HOSPITAL_COMMUNITY): Payer: Self-pay | Admitting: Emergency Medicine

## 2021-03-20 DIAGNOSIS — N400 Enlarged prostate without lower urinary tract symptoms: Secondary | ICD-10-CM | POA: Diagnosis not present

## 2021-03-20 DIAGNOSIS — I2510 Atherosclerotic heart disease of native coronary artery without angina pectoris: Secondary | ICD-10-CM | POA: Diagnosis not present

## 2021-03-20 DIAGNOSIS — Z951 Presence of aortocoronary bypass graft: Secondary | ICD-10-CM | POA: Insufficient documentation

## 2021-03-20 DIAGNOSIS — I471 Supraventricular tachycardia, unspecified: Secondary | ICD-10-CM | POA: Diagnosis present

## 2021-03-20 DIAGNOSIS — Z79899 Other long term (current) drug therapy: Secondary | ICD-10-CM | POA: Insufficient documentation

## 2021-03-20 DIAGNOSIS — E1122 Type 2 diabetes mellitus with diabetic chronic kidney disease: Secondary | ICD-10-CM | POA: Insufficient documentation

## 2021-03-20 DIAGNOSIS — N1832 Chronic kidney disease, stage 3b: Secondary | ICD-10-CM | POA: Diagnosis not present

## 2021-03-20 DIAGNOSIS — I25810 Atherosclerosis of coronary artery bypass graft(s) without angina pectoris: Secondary | ICD-10-CM | POA: Diagnosis not present

## 2021-03-20 DIAGNOSIS — I130 Hypertensive heart and chronic kidney disease with heart failure and stage 1 through stage 4 chronic kidney disease, or unspecified chronic kidney disease: Secondary | ICD-10-CM | POA: Insufficient documentation

## 2021-03-20 DIAGNOSIS — E119 Type 2 diabetes mellitus without complications: Secondary | ICD-10-CM

## 2021-03-20 DIAGNOSIS — Z87891 Personal history of nicotine dependence: Secondary | ICD-10-CM | POA: Insufficient documentation

## 2021-03-20 DIAGNOSIS — Z7984 Long term (current) use of oral hypoglycemic drugs: Secondary | ICD-10-CM | POA: Insufficient documentation

## 2021-03-20 DIAGNOSIS — I82409 Acute embolism and thrombosis of unspecified deep veins of unspecified lower extremity: Secondary | ICD-10-CM | POA: Insufficient documentation

## 2021-03-20 DIAGNOSIS — Z8673 Personal history of transient ischemic attack (TIA), and cerebral infarction without residual deficits: Secondary | ICD-10-CM

## 2021-03-20 DIAGNOSIS — Z20822 Contact with and (suspected) exposure to covid-19: Secondary | ICD-10-CM | POA: Insufficient documentation

## 2021-03-20 DIAGNOSIS — Z7901 Long term (current) use of anticoagulants: Secondary | ICD-10-CM | POA: Insufficient documentation

## 2021-03-20 DIAGNOSIS — I509 Heart failure, unspecified: Secondary | ICD-10-CM | POA: Diagnosis not present

## 2021-03-20 DIAGNOSIS — E1165 Type 2 diabetes mellitus with hyperglycemia: Secondary | ICD-10-CM

## 2021-03-20 DIAGNOSIS — F0150 Vascular dementia without behavioral disturbance: Secondary | ICD-10-CM | POA: Diagnosis present

## 2021-03-20 DIAGNOSIS — R0789 Other chest pain: Secondary | ICD-10-CM | POA: Diagnosis present

## 2021-03-20 DIAGNOSIS — I5042 Chronic combined systolic (congestive) and diastolic (congestive) heart failure: Secondary | ICD-10-CM

## 2021-03-20 DIAGNOSIS — N183 Chronic kidney disease, stage 3 unspecified: Secondary | ICD-10-CM | POA: Diagnosis present

## 2021-03-20 DIAGNOSIS — I10 Essential (primary) hypertension: Secondary | ICD-10-CM | POA: Diagnosis present

## 2021-03-20 DIAGNOSIS — I5043 Acute on chronic combined systolic (congestive) and diastolic (congestive) heart failure: Secondary | ICD-10-CM

## 2021-03-20 LAB — RESP PANEL BY RT-PCR (FLU A&B, COVID) ARPGX2
Influenza A by PCR: NEGATIVE
Influenza B by PCR: NEGATIVE
SARS Coronavirus 2 by RT PCR: NEGATIVE

## 2021-03-20 LAB — HEPATIC FUNCTION PANEL
ALT: 26 U/L (ref 0–44)
AST: 31 U/L (ref 15–41)
Albumin: 3.8 g/dL (ref 3.5–5.0)
Alkaline Phosphatase: 78 U/L (ref 38–126)
Bilirubin, Direct: 0.5 mg/dL — ABNORMAL HIGH (ref 0.0–0.2)
Indirect Bilirubin: 0.6 mg/dL (ref 0.3–0.9)
Total Bilirubin: 1.1 mg/dL (ref 0.3–1.2)
Total Protein: 6.4 g/dL — ABNORMAL LOW (ref 6.5–8.1)

## 2021-03-20 LAB — BASIC METABOLIC PANEL
Anion gap: 11 (ref 5–15)
BUN: 47 mg/dL — ABNORMAL HIGH (ref 8–23)
CO2: 21 mmol/L — ABNORMAL LOW (ref 22–32)
Calcium: 9.3 mg/dL (ref 8.9–10.3)
Chloride: 104 mmol/L (ref 98–111)
Creatinine, Ser: 2.86 mg/dL — ABNORMAL HIGH (ref 0.61–1.24)
GFR, Estimated: 23 mL/min — ABNORMAL LOW (ref >60)
Glucose, Bld: 308 mg/dL — ABNORMAL HIGH (ref 70–99)
Potassium: 5 mmol/L (ref 3.5–5.1)
Sodium: 136 mmol/L (ref 135–145)

## 2021-03-20 LAB — PROTIME-INR
INR: 1.1 (ref 0.8–1.2)
Prothrombin Time: 14.6 seconds (ref 11.4–15.2)

## 2021-03-20 LAB — TROPONIN I (HIGH SENSITIVITY)
Troponin I (High Sensitivity): 17 ng/L (ref <18)
Troponin I (High Sensitivity): 18 ng/L — ABNORMAL HIGH (ref <18)

## 2021-03-20 LAB — CBC
HCT: 45.9 % (ref 39.0–52.0)
Hemoglobin: 15.6 g/dL (ref 13.0–17.0)
MCH: 29.9 pg (ref 26.0–34.0)
MCHC: 34 g/dL (ref 30.0–36.0)
MCV: 87.9 fL (ref 80.0–100.0)
Platelets: 155 10*3/uL (ref 150–400)
RBC: 5.22 MIL/uL (ref 4.22–5.81)
RDW: 13.4 % (ref 11.5–15.5)
WBC: 9.2 10*3/uL (ref 4.0–10.5)
nRBC: 0 % (ref 0.0–0.2)

## 2021-03-20 LAB — LIPASE, BLOOD: Lipase: 40 U/L (ref 11–51)

## 2021-03-20 LAB — CBG MONITORING, ED: Glucose-Capillary: 241 mg/dL — ABNORMAL HIGH (ref 70–99)

## 2021-03-20 LAB — TSH: TSH: 3.541 u[IU]/mL (ref 0.350–4.500)

## 2021-03-20 MED ORDER — POLYETHYLENE GLYCOL 3350 17 G PO PACK: 17.0000 g | PACK | Freq: Every day | ORAL | Status: DC | PRN

## 2021-03-20 MED ORDER — METOPROLOL TARTRATE 5 MG/5ML IV SOLN
5.0000 mg | Freq: Once | INTRAVENOUS | Status: AC
  Administered 2021-03-20: 5 mg via INTRAVENOUS
  Filled 2021-03-20: qty 5

## 2021-03-20 MED ORDER — AMIODARONE HCL IN DEXTROSE 360-4.14 MG/200ML-% IV SOLN
60.0000 mg/h | INTRAVENOUS | Status: DC
  Administered 2021-03-20: 60 mg/h via INTRAVENOUS
  Filled 2021-03-20: qty 200

## 2021-03-20 MED ORDER — ATORVASTATIN CALCIUM 40 MG PO TABS
80.0000 mg | ORAL_TABLET | Freq: Every day | ORAL | Status: DC
  Administered 2021-03-21: 80 mg via ORAL
  Filled 2021-03-20: qty 2

## 2021-03-20 MED ORDER — ADENOSINE 6 MG/2ML IV SOLN: 12.0000 mg | Freq: Once | INTRAVENOUS | Status: AC

## 2021-03-20 MED ORDER — ADENOSINE 6 MG/2ML IV SOLN
INTRAVENOUS | Status: AC
  Administered 2021-03-20: 6 mg via INTRAVENOUS
  Filled 2021-03-20: qty 4

## 2021-03-20 MED ORDER — TAMSULOSIN HCL 0.4 MG PO CAPS
0.4000 mg | ORAL_CAPSULE | Freq: Every day | ORAL | Status: DC
  Administered 2021-03-21: 0.4 mg via ORAL
  Filled 2021-03-20: qty 1

## 2021-03-20 MED ORDER — SODIUM CHLORIDE 0.9% FLUSH: 3.0000 mL | Freq: Two times a day (BID) | INTRAVENOUS | Status: DC

## 2021-03-20 MED ORDER — APIXABAN 5 MG PO TABS
5.0000 mg | ORAL_TABLET | Freq: Two times a day (BID) | ORAL | Status: DC
  Administered 2021-03-20 – 2021-03-21 (×2): 5 mg via ORAL
  Filled 2021-03-20 (×2): qty 1

## 2021-03-20 MED ORDER — ACETAMINOPHEN 650 MG RE SUPP: 650.0000 mg | Freq: Four times a day (QID) | RECTAL | Status: DC | PRN

## 2021-03-20 MED ORDER — ADENOSINE 6 MG/2ML IV SOLN
6.0000 mg | Freq: Once | INTRAVENOUS | Status: AC
  Filled 2021-03-20: qty 2

## 2021-03-20 MED ORDER — SODIUM CHLORIDE 0.9 % IV BOLUS
1000.0000 mL | Freq: Once | INTRAVENOUS | Status: AC
  Administered 2021-03-20: 1000 mL via INTRAVENOUS

## 2021-03-20 MED ORDER — ADENOSINE 6 MG/2ML IV SOLN
6.0000 mg | Freq: Once | INTRAVENOUS | Status: AC
  Administered 2021-03-20: 6 mg via INTRAVENOUS

## 2021-03-20 MED ORDER — ADENOSINE 6 MG/2ML IV SOLN
INTRAVENOUS | Status: AC
  Filled 2021-03-20: qty 2

## 2021-03-20 MED ORDER — AMIODARONE HCL IN DEXTROSE 360-4.14 MG/200ML-% IV SOLN
30.0000 mg/h | INTRAVENOUS | Status: DC
  Administered 2021-03-21: 30 mg/h via INTRAVENOUS
  Filled 2021-03-20: qty 200

## 2021-03-20 MED ORDER — AMIODARONE LOAD VIA INFUSION
150.0000 mg | Freq: Once | INTRAVENOUS | Status: AC
  Administered 2021-03-20: 150 mg via INTRAVENOUS
  Filled 2021-03-20: qty 83.34

## 2021-03-20 MED ORDER — INSULIN ASPART 100 UNIT/ML IJ SOLN
0.0000 [IU] | Freq: Every day | INTRAMUSCULAR | Status: DC
Start: 2021-03-20 — End: 2021-03-21
  Administered 2021-03-20: 2 [IU] via SUBCUTANEOUS

## 2021-03-20 MED ORDER — ACETAMINOPHEN 325 MG PO TABS: 650.0000 mg | ORAL_TABLET | Freq: Four times a day (QID) | ORAL | Status: DC | PRN

## 2021-03-20 MED ORDER — ADENOSINE 6 MG/2ML IV SOLN
INTRAVENOUS | Status: AC
  Administered 2021-03-20: 12 mg via INTRAVENOUS
  Filled 2021-03-20: qty 4

## 2021-03-20 MED ORDER — INSULIN ASPART 100 UNIT/ML IJ SOLN
0.0000 [IU] | Freq: Three times a day (TID) | INTRAMUSCULAR | Status: DC
  Administered 2021-03-21: 5 [IU] via SUBCUTANEOUS
  Administered 2021-03-21: 3 [IU] via SUBCUTANEOUS

## 2021-03-20 NOTE — ED Triage Notes (Signed)
Patient arrived with EMS from home reports central chest pain with SOB and lightheaded this evening , HR=130-140 at arrival , no emesis or diaphoresis .

## 2021-03-20 NOTE — ED Notes (Signed)
Pt's HR went down to 92 after adenosine , but went back to 132 after 2 mins. MD LONG made aware.

## 2021-03-20 NOTE — H&P (Addendum)
History and Physical   Shane Holloway GNO:[id number] DOB: 04-01-1951 DOA: 03/20/2021  PCP: Orma Flaming, MD   Patient coming from: Home  Chief Complaint: Chest pain and shortness of breath, lightheadedness  HPI: Shane Holloway is a 70 y.o. male with medical history significant of CHF, CAD, hypertension, diabetes, BPH, CVA, CKD 3, status post AVR, vascular dementia, history of DVT on Eliquis who presents with ongoing intermittent chest pain shortness of breath.  Patient reports he has had some intermittent symptoms for months. He was recently evaluated at [facility] for near syncope lab work-up showed some positive orthostatics but no abnormal rhythm while there or other explanation.  On arrival to ED here found to be tachycardic to the 130s with borderline blood pressure in the 16X systolic.  EKG noted to be SVT.  Was administered adenosine x2 and was in sinus rhythm for only minutes before returning to SVT then was given metoprolol and adenosine and again returned to sinus rhythm for only minutes and has since been started on amiodarone.  Patient denies fevers, chills, abdominal pain, constipation, diarrhea, nausea, vomiting.   ED Course: Vital signs in the ED significant for heart rate in the 120s 130s and blood pressure ranging from the 45W to 388 systolic.  Respiratory rate in the teens to 20s.    Lab work-up showed CMP with creatinine stable at 2.86, glucose 308, bicarb 21, BUN 47, protein 6.4.  CBC within normal limits.  PT and INR within normal limits.  Troponin negative with repeat pending.  TSH normal.  Lipase normal.  Respiratory panel flu COVID negative.  Chest x-ray showed no acute normality. He received 1 L of IV fluids, adenosine x3, metoprolol, now on amiodarone drip.  Cardiology was consulted and will be seeing the patient and recommended the above amiodarone drip.  Review of Systems: As per HPI otherwise all other systems reviewed and are negative.  Past Medical History:   Diagnosis Date   Allergy    CHF (congestive heart failure) ([place])    Diabetes mellitus    Dyspnea    Hyperlipidemia    Hypertension    Myocardial infarction ([place]) 2004    Past Surgical History:  Procedure Laterality Date   CORONARY ARTERY BYPASS GRAFT     x4; stents to diagonal a mid right coronary lesion; drug eluting stent in the mid RCA as well as ostium of the diagonal with a cutting balloon angioplasty of the diagonal.   JOINT REPLACEMENT      Social History  reports that he has quit smoking. He does not have any smokeless tobacco history on file. He reports that he does not drink alcohol and does not use drugs.  Allergies  Allergen Reactions   Ramipril Other (See Comments)    Cough with Altace   Sulfa Antibiotics Hives    Family History  Problem Relation Age of Onset   Heart disease Mother    Heart disease Father   Reviewed on admission  Prior to Admission medications   Medication Sig Start Date End Date Taking? Authorizing Provider  amLODipine (NORVASC) 10 MG tablet TAKE ONE TABLET BY MOUTH EVERY DAY Patient taking differently: Take 10 mg by mouth daily. 09/06/11  Yes Wall, Marijo Conception, MD  amLODipine (NORVASC) 10 MG tablet TAKE ONE TABLET BY MOUTH EVERY DAY Patient taking differently: Take 10 mg by mouth daily. 09/09/11  Yes Kemper Durie, PA-C  atorvastatin (LIPITOR) 80 MG tablet Take 80 mg by mouth daily. 12/12/20  Yes  [provider]  carvedilol (COREG) 25 MG tablet Take 25 mg by mouth 2 (two) times daily. 01/11/21  Yes [provider]  ELIQUIS 5 MG TABS tablet Take 5 mg by mouth 2 (two) times daily. 03/09/21  Yes [provider]  furosemide (LASIX) 20 MG tablet Take 1 tablet (20 mg total) by mouth daily. 09/15/11 03/20/21 Yes Robyn Haber, MD  glipiZIDE (GLUCOTROL) 5 MG tablet Take 5 mg by mouth daily.   Yes [provider]  JARDIANCE 10 MG TABS tablet Take 10 mg by mouth daily. 03/09/21  Yes [provider]  lisinopril  (PRINIVIL,ZESTRIL) 10 MG tablet Take one tablet daily for blood pressure control Patient taking differently: Take 10 mg by mouth daily. 05/23/12  Yes Darlyne Russian, MD  losartan (COZAAR) 25 MG tablet Take 25 mg by mouth daily.   Yes [provider]  metFORMIN (GLUCOPHAGE-XR) 500 MG 24 hr tablet TAKE TWO TABLETS BY MOUTH EVERY DAY IN THE MORNING, AND TAKE ONE TABLET EVERY DAY IN THE EVENING **RETURN TO CLINIC BEFORE OUT** Patient taking differently: Take 1,000 mg by mouth 2 (two) times daily. 12/21/11  Yes Jeffery, [place], PA  metoprolol tartrate (LOPRESSOR) 25 MG tablet Take 25 mg by mouth 2 (two) times daily.   Yes [provider]  pravastatin (PRAVACHOL) 40 MG tablet TAKE ONE TABLET BY MOUTH EVERY DAY FOR CHOLESTEROL Patient taking differently: Take 40 mg by mouth daily. 11/10/67  Yes Beatriz Chancellor, PA-C  Tamsulosin HCl (FLOMAX) 0.4 MG CAPS TAKE ONE CAPSULE BY MOUTH EVERY DAY Patient taking differently: Take 0.4 mg by mouth daily. 12/27/11  Yes Harrison Mons, PA    Physical Exam: Vitals:   03/20/21 1954 03/20/21 2130 03/20/21 2228 03/20/21 2230  BP: (!) 88/65 (!) 120/101  94/79  Pulse: (!) 135 (!) 122 (!) 125 (!) 123  Resp: 18 18 (!) 21 19  Temp: 97.7 F (36.5 C)     TempSrc: Oral     SpO2: 100% 97% 95% 95%   Physical Exam Constitutional:      General: He is not in acute distress.    Appearance: Normal appearance.  HENT:     Head: Normocephalic and atraumatic.     Mouth/Throat:     Mouth: Mucous membranes are moist.     Pharynx: Oropharynx is clear.  Eyes:     Extraocular Movements: Extraocular movements intact.     Pupils: Pupils are equal, round, and reactive to light.  Cardiovascular:     Rate and Rhythm: Regular rhythm. Tachycardia present.     Pulses: Normal pulses.     Heart sounds: Normal heart sounds.  Pulmonary:     Effort: Pulmonary effort is normal. No respiratory distress.     Breath sounds: Normal breath sounds.  Abdominal:     General:  Bowel sounds are normal. There is no distension.     Palpations: Abdomen is soft.     Tenderness: There is no abdominal tenderness.  Musculoskeletal:        General: No swelling or deformity.  Skin:    General: Skin is warm and dry.  Neurological:     General: No focal deficit present.     Mental Status: Mental status is at baseline.   Labs on Admission: I have personally reviewed following labs and imaging studies  CBC: Recent Labs  Lab 03/20/21 2018  WBC 9.2  HGB 15.6  HCT 45.9  MCV 87.9  PLT 485    Basic Metabolic Panel:  Recent Labs  Lab 03/20/21 2018  NA 136  K 5.0  CL 104  CO2 21*  GLUCOSE 308*  BUN 47*  CREATININE 2.86*  CALCIUM 9.3    GFR: CrCl cannot be calculated (Unknown ideal weight.).  Liver Function Tests: Recent Labs  Lab 03/20/21 2032  AST 31  ALT 26  ALKPHOS 78  BILITOT 1.1  PROT 6.4*  ALBUMIN 3.8    Urine analysis:    Component Value Date/Time   COLORURINE AMBER BIOCHEMICALS MAY BE AFFECTED BY COLOR (A) 07/27/2007 1303   APPEARANCEUR CLOUDY (A) 07/27/2007 1303   LABSPEC 1.030 07/27/2007 1303   PHURINE 5.5 07/27/2007 1303   GLUCOSEU 250 (A) 07/27/2007 1303   HGBUR NEGATIVE 07/27/2007 1303   BILIRUBINUR neg 08/10/2011 1236   KETONESUR NEGATIVE 07/27/2007 1303   PROTEINUR 30 08/10/2011 1236   PROTEINUR 100 (A) 07/27/2007 1303   UROBILINOGEN 0.2 08/10/2011 1236   UROBILINOGEN 0.2 07/27/2007 1303   NITRITE neg 08/10/2011 1236   NITRITE NEGATIVE 07/27/2007 1303   LEUKOCYTESUR moderate (2+) 08/10/2011 1236    Radiological Exams on Admission: DG Chest Port 1 View  Result Date: 03/20/2021 CLINICAL DATA:  Chest pain EXAM: PORTABLE CHEST 1 VIEW COMPARISON:  12/17/2020 FINDINGS: Lungs are well expanded, symmetric, and clear. No pneumothorax or pleural effusion. Cardiac size within normal limits. Coronary artery bypass grafting has been performed. Pulmonary vascularity is normal. Osseous structures are age-appropriate. No acute bone  abnormality. IMPRESSION: No active disease. Electronically Signed   By: Fidela Salisbury M.D.   On: 03/20/2021 22:16    EKG: Independently reviewed.  Initial EKG with SVT at 137 bpm and incomplete left bundle branch block.  Repeat EKG later in ED course showed temporary reversion to sinus rhythm at 88 bpm.  Assessment/Plan Principal Problem:   SVT (supraventricular tachycardia) (HCC) Active Problems:   Type 2 diabetes mellitus without complication (HCC)   Essential hypertension   CORONARY ATHEROSCLEROSIS, ARTERY BYPASS GRAFT   Congestive heart failure (HCC)   Benign prostatic hyperplasia   History of stroke   Stage 3 chronic kidney disease ([place])   Vascular dementia without behavioral disturbance (HCC)  SVT > Patient with ongoing intermittent chest pain shortness of breath.  Work-up at [facility] recently for possible near syncope only found to have some orthostatic vitals positive.  Plan was for monitor outpatient.  No abnormal rhythms seen while there. > On arrival to the ED here he was in SVT with heart rate in the 130s.  Was refractory as it only returned to sinus rhythm 4 minutes in response to 2 initial doses of adenosine and then return just for minutes again after dose of metoprolol followed by adenosine. > Cardiology has been consulted and on their recommendation patient has been started on amiodarone drip, cardiology will be seeing the patient tonight. > As below patient has had recent echocardiogram in August. - Monitor on progressive unit - Appreciate cardiology recommendations - Continue amiodarone drip  CHF Hypertension CAD Status post AVR > Last echo in August 2022 showed EF greater than 55% and artificial aortic valve. > Hx of CABG > Given patient's borderline blood pressure in the ED with resistant SVT, holding off on antihypertensives for now. > May also need to clarify medications as we have more medications listed than he did on his discharge paperwork from [facility].  They had  only losartan 25 mg, carvedilol 25 mg, spironolactone 25 mg listed.  Here we additionally have listed lisinopril, Lasix, amlodipine.  All of these medications  thought to be confirmed by niece.  However on additional review of chart his cardiologist visit from 9/15 shows only losartan, carvedilol, spironolactone. - Holding antihypertensives at this time  DVT > History of recent DVT currently on Eliquis 5 mg twice daily - Continue Eliquis 5 mg twice daily  Diabetes > Hyperglycemic in the ED to 300 - Start sliding scale insulin with nightly coverage  CKD 3b > Creatinine appears to be stable at around 2.8, however EGFR is calculated in the 20s which would signify possible progression to CKD 4. - Continue to monitor - Avoid nephrotoxic agents - Trend renal function and electrolytes  BPH - Resume tamsulosin tomorrow  Hx of CVA - Continue home statin - Not on antiplatelets  DVT prophylaxis: Eliquis  Code Status:   Full Family Communication:  Niece contacted by phone and updated. Disposition Plan:   Patient is from:  Home  Anticipated DC to:  Home  Anticipated DC date:  1 to 5 days  Anticipated DC barriers: None  Consults called:  Cardiology consulted by EDP, will be seeing the patient tonight.   Admission status:  Observation, progressive   Severity of Illness: The appropriate patient status for this patient is OBSERVATION. Observation status is judged to be reasonable and necessary in order to provide the required intensity of service to ensure the patient's safety. The patient's presenting symptoms, physical exam findings, and initial radiographic and laboratory data in the context of their medical condition is felt to place them at decreased risk for further clinical deterioration. Furthermore, it is anticipated that the patient will be medically stable for discharge from the hospital within 2 midnights of admission. The following factors support the patient status of observation.    " The patient's presenting symptoms include chest pain, shortness of breath. " The physical exam findings include tachycardia. " The initial radiographic and laboratory data are ab work-up showed CMP with creatinine stable at 2.86, glucose 308, bicarb 21, BUN 47, protein 6.4.  CBC within normal limits.  PT and INR within normal limits.  Troponin negative with repeat pending.  TSH normal.  Lipase normal.  Respiratory panel flu COVID negative.  Chest x-ray showed no acute normality.   Marcelyn Bruins MD [facility]  How to contact the [facility] Attending or Consulting provider [place] or covering provider during after hours [place], for this patient?   Check the care team in [facility] and look for a) attending/consulting [facility] provider listed and b) the [facility] team listed Log into [email or web address] and use [place]'s universal password to access. If you do not have the password, please contact the hospital operator. Locate the [facility] provider you are looking for under [facility] and page to a number that you can be directly reached. If you still have difficulty reaching the provider, please page the [facility] (Director on Call) for the Hospitalists listed on amion for assistance.  03/20/2021, 11:17 PM

## 2021-03-20 NOTE — ED Provider Notes (Signed)
Emergency Department Provider Note   I have reviewed the triage vital signs and the nursing notes.   HISTORY  Chief Complaint Chest Pain   HPI Shane Holloway is a 70 y.o. male with past medical history reviewed below presents to the emergency department with intermittent episodes of chest pain with shortness of breath.  Family at bedside state that the patient has a history of dementia but is also seem more confused today.  He has been seen multiple times for this in the past including a recent admission to [facility] with no clear etiology for the symptoms found.  The patient's family state that today he was having intermittent heart rates as high as the 170s and pulse ox which was difficult to pick up.  His episodes happen both with exertion and at rest.  They noticed that he turns "gray" in appearance and then is having complaints of weakness as well as pain in the left chest that is non-radiating.  He does take daily metoprolol and took his dose this morning as prescribed.  A diagnosis of anxiety has been suggested but patient does not believe this is the case.   Past Medical History:  Diagnosis Date   Allergy    CHF (congestive heart failure) ([place])    Diabetes mellitus    Dyspnea    Hyperlipidemia    Hypertension    Myocardial infarction [facility]) 2004    Patient Active Problem List   Diagnosis Date Noted   SVT (supraventricular tachycardia) ([place]) 03/20/2021   S/P aortic valve replacement with bioprosthetic valve 02/22/2021   Vascular dementia without behavioral disturbance ([place]) 09/13/2020   Stage 3 chronic kidney disease ([place]) 04/20/2017   History of stroke 02/22/2014   Benign prostatic hyperplasia 04/16/2012   DM 06/27/2009   CORONARY ATHEROSCLEROSIS, ARTERY BYPASS GRAFT 06/27/2009   HYPERTENSION 11/24/2008   CHF 11/24/2008   DYSPNEA 11/24/2008    Past Surgical History:  Procedure Laterality Date   CORONARY ARTERY BYPASS GRAFT     x4; stents to diagonal a mid right  coronary lesion; drug eluting stent in the mid RCA as well as ostium of the diagonal with a cutting balloon angioplasty of the diagonal.   JOINT REPLACEMENT      Allergies Ramipril and Sulfa antibiotics  Family History  Problem Relation Age of Onset   Heart disease Mother    Heart disease Father     Social History Social History   Tobacco Use   Smoking status: Former  Substance Use Topics   Alcohol use: No   Drug use: No    Review of Systems  Constitutional: No fever/chills Eyes: No visual changes. ENT: No sore throat. Cardiovascular: Positive chest pain. Respiratory: Positive shortness of breath. Gastrointestinal: No abdominal pain.  No nausea, no vomiting.  No diarrhea.  No constipation. Genitourinary: Negative for dysuria. Musculoskeletal: Negative for back pain. Skin: Negative for rash. Neurological: Negative for headaches, focal weakness or numbness.  10-point ROS otherwise negative.  ____________________________________________   PHYSICAL EXAM:  VITAL SIGNS: ED Triage Vitals  Enc Vitals Group     BP 03/20/21 1954 (!) 88/65     Pulse Rate 03/20/21 1954 (!) 135     Resp 03/20/21 1954 18     Temp 03/20/21 1954 97.7 F (36.5 C)     Temp Source 03/20/21 1954 Oral     SpO2 03/20/21 1954 100 %   Constitutional: Alert and oriented. Well appearing and in no acute distress. Eyes: Conjunctivae are normal.  Head: Atraumatic. Nose: No congestion/rhinnorhea. Mouth/Throat: Mucous membranes are moist.  Neck: No stridor.   Cardiovascular: Tachycardia. Good peripheral circulation. Grossly normal heart sounds.   Respiratory: Normal respiratory effort.  No retractions. Lungs CTAB. Gastrointestinal: Soft and nontender. No distention.  Musculoskeletal: No lower extremity tenderness nor edema. No gross deformities of extremities. Neurologic:  Normal speech and language. No gross focal neurologic deficits are appreciated.  Skin:  Skin is warm, dry and intact. No rash  noted.   ____________________________________________   LABS (all labs ordered are listed, but only abnormal results are displayed)  Labs Reviewed  BASIC METABOLIC PANEL - Abnormal; Notable for the following components:      Result Value   CO2 21 (*)    Glucose, Bld 308 (*)    BUN 47 (*)    Creatinine, Ser 2.86 (*)    GFR, Estimated 23 (*)    All other components within normal limits  HEPATIC FUNCTION PANEL - Abnormal; Notable for the following components:   Total Protein 6.4 (*)    Bilirubin, Direct 0.5 (*)    All other components within normal limits  RESP PANEL BY RT-PCR (FLU A&B, COVID) ARPGX2  CBC  LIPASE, BLOOD  PROTIME-INR  TSH  MAGNESIUM  COMPREHENSIVE METABOLIC PANEL  CBC  TROPONIN I (HIGH SENSITIVITY)  TROPONIN I (HIGH SENSITIVITY)   ____________________________________________  EKG   EKG Interpretation  Date/Time:  Tuesday March 20 2021 19:58:33 EDT Ventricular Rate:  137 PR Interval:    QRS Duration: 112 QT Interval:  328 QTC Calculation: 495 R Axis:   20 Text Interpretation: Supraventricular tachycardia Incomplete left bundle branch block Nonspecific ST and T wave abnormality Abnormal ECG Confirmed by Nanda Quinton [phone]) on 03/20/2021 8:34:02 PM       Post adenosine:    EKG Interpretation  Date/Time:  Tuesday March 20 2021 21:06:08 EDT Ventricular Rate:  88 PR Interval:  172 QRS Duration: 108 QT Interval:  374 QTC Calculation: 453 R Axis:   -52 Text Interpretation: Sinus rhythm Sinus pause Left axis deviation Abnormal T, consider ischemia, lateral leads Baseline wander in lead(s) V3 Confirmed by Nanda Quinton [phone]) on 03/20/2021 9:42:57 PM        ____________________________________________  RADIOLOGY  DG Chest Port 1 View  Result Date: 03/20/2021 CLINICAL DATA:  Chest pain EXAM: PORTABLE CHEST 1 VIEW COMPARISON:  12/17/2020 FINDINGS: Lungs are well expanded, symmetric, and clear. No pneumothorax or pleural effusion. Cardiac  size within normal limits. Coronary artery bypass grafting has been performed. Pulmonary vascularity is normal. Osseous structures are age-appropriate. No acute bone abnormality. IMPRESSION: No active disease. Electronically Signed   By: Fidela Salisbury M.D.   On: 03/20/2021 22:16    ____________________________________________   PROCEDURES  Procedure(s) performed:   .Critical Care Performed by: Margette Fast, MD Authorized by: Margette Fast, MD   Critical care provider statement:    Critical care time (minutes):  45   Critical care time was exclusive of:  Separately billable procedures and treating other patients and teaching time   Critical care was necessary to treat or prevent imminent or life-threatening deterioration of the following conditions:  Cardiac failure   Critical care was time spent personally by me on the following activities:  Blood draw for specimens, development of treatment plan with patient or surrogate, discussions with consultants, evaluation of patient's response to treatment, examination of patient, ordering and performing treatments and interventions, ordering and review of laboratory studies, ordering and review of radiographic studies, re-evaluation of  patient's condition, review of old charts, pulse oximetry and obtaining history from patient or surrogate   I assumed direction of critical care for this patient from another provider in my specialty: no     Care discussed with: admitting provider     ____________________________________________   INITIAL IMPRESSION / ASSESSMENT AND PLAN / ED COURSE  Pertinent labs & imaging results that were available during my care of the patient were reviewed by me and considered in my medical decision making (see chart for details).   Patient arrives to the emergency department with intermittent chest pain along with shortness of breath.  On arrival he has tachycardia to the 130s with soft blood pressures into the 80s at  times.  Review of his initial EKG shows what appears to be SVT.  I have reviewed his discharge summary from [facility] which did not mention this captured on monitor.  They did find some orthostatic hypotension during that admit.   After discussion with the family and patient the patient was given adenosine 6 mg with no change or pause. He was given adenosine 12 mg push with pause and conversion to sinus rhythm.   The patient remained in sinus rhythm for only a few minutes and then converted back into SVT with a rate of 130.  He was given IV metoprolol and another 12 mg adenosine push which again briefly returned him to sinus rhythm.  He then returned to SVT after only 1 to 2 minutes.  I was able to capture an EKG while he is in sinus rhythm.  Lab work shows hyperglycemia without DKA.  No acute electrolyte abnormality.  Discussed with the cardiology team, Dr. Humphrey Rolls.  He recommends 150 mg amiodarone bolus followed by infusion.  He will be down to consult.   Discussed patient's case with [facility] to request admission. Patient and family (if present) updated with plan. Care transferred to [facility] service.  I reviewed all nursing notes, vitals, pertinent old records, EKGs, labs, imaging (as available).  ____________________________________________  FINAL CLINICAL IMPRESSION(S) / ED DIAGNOSES  Final diagnoses:  SVT (supraventricular tachycardia) ([place])     MEDICATIONS GIVEN DURING THIS VISIT:  Medications  adenosine (ADENOCARD) 6 MG/2ML injection (  Not Given 03/20/21 2227)  amiodarone (NEXTERONE PREMIX) 360-4.14 MG/200ML-% (1.8 mg/mL) IV infusion (60 mg/hr Intravenous New Bag/Given 03/20/21 2226)    Followed by  amiodarone (NEXTERONE PREMIX) 360-4.14 MG/200ML-% (1.8 mg/mL) IV infusion (has no administration in time range)  atorvastatin (LIPITOR) tablet 80 mg (has no administration in time range)  apixaban (ELIQUIS) tablet 5 mg (has no administration in time range)  sodium chloride flush (NS) 0.9 % injection  3 mL (has no administration in time range)  acetaminophen (TYLENOL) tablet 650 mg (has no administration in time range)    Or  acetaminophen (TYLENOL) suppository 650 mg (has no administration in time range)  polyethylene glycol (MIRALAX / GLYCOLAX) packet 17 g (has no administration in time range)  sodium chloride 0.9 % bolus 1,000 mL (0 mLs Intravenous Stopped 03/20/21 2210)  adenosine (ADENOCARD) 6 MG/2ML injection 6 mg (6 mg Intravenous Given 03/20/21 2056)  adenosine (ADENOCARD) 6 MG/2ML injection 6 mg (6 mg Intravenous Given 03/20/21 2056)  adenosine (ADENOCARD) 6 MG/2ML injection 12 mg (12 mg Intravenous Given 03/20/21 2109)  metoprolol tartrate (LOPRESSOR) injection 5 mg (5 mg Intravenous Given 03/20/21 2110)  amiodarone (NEXTERONE) 1.8 mg/mL load via infusion 150 mg (150 mg Intravenous Bolus from Bag 03/20/21 2226)    Note:  This document was  prepared using Systems analyst and may include unintentional dictation errors.  Nanda Quinton, MD, [facility] Emergency Medicine    Christyana Corwin, [facility], MD 03/20/21 [phone]

## 2021-03-20 NOTE — ED Notes (Signed)
Pt's HR went down to 72 after adenosine, went back to 120s again after several mins. MD at bedside.

## 2021-03-20 NOTE — ED Provider Notes (Addendum)
Emergency Medicine Provider Triage Evaluation Note  Shane Holloway , a 70 y.o. male  was evaluated in triage.  Pt complains of sob, palpitations. Family at bedside states pt has been seen multiple times and has had reassuring w/u's, he was just admitted at [facility] last week.  Review of Systems  Positive: Sob, palpitations Negative: fever  Physical Exam  BP (!) 88/65   Pulse (!) 135   Temp 97.7 F (36.5 C) (Oral)   Resp 18   SpO2 100%  Gen:   Awake, no distress   Resp:  Normal effort  MSK:   Moves extremities without difficulty  Other:  Tachycardic, regular rhythm, midline surgical scar  Medical Decision Making  Medically screening exam initiated at 8:03 PM.  Appropriate orders placed.  [name] was informed that the remainder of the evaluation will be completed by another provider, this initial triage assessment does not replace that evaluation, and the importance of remaining in the ED until their evaluation is complete.  Pt in SVT and is hypotensive. Advised nursing pt will need to be prioritized for a room   Rodney Booze, PA-C 03/20/21 [address] 03/20/21 2005    Long, Joshua G, MD 03/20/21 2207

## 2021-03-20 NOTE — ED Notes (Signed)
Pt niece left number to receive updates; Justine Null [phone]

## 2021-03-21 DIAGNOSIS — I471 Supraventricular tachycardia: Principal | ICD-10-CM

## 2021-03-21 DIAGNOSIS — F0150 Vascular dementia without behavioral disturbance: Secondary | ICD-10-CM

## 2021-03-21 DIAGNOSIS — I25708 Atherosclerosis of coronary artery bypass graft(s), unspecified, with other forms of angina pectoris: Secondary | ICD-10-CM

## 2021-03-21 DIAGNOSIS — I4891 Unspecified atrial fibrillation: Secondary | ICD-10-CM | POA: Diagnosis not present

## 2021-03-21 LAB — COMPREHENSIVE METABOLIC PANEL
ALT: 24 U/L (ref 0–44)
AST: 16 U/L (ref 15–41)
Albumin: 3.4 g/dL — ABNORMAL LOW (ref 3.5–5.0)
Alkaline Phosphatase: 71 U/L (ref 38–126)
Anion gap: 8 (ref 5–15)
BUN: 43 mg/dL — ABNORMAL HIGH (ref 8–23)
CO2: 22 mmol/L (ref 22–32)
Calcium: 8.9 mg/dL (ref 8.9–10.3)
Chloride: 107 mmol/L (ref 98–111)
Creatinine, Ser: 2.72 mg/dL — ABNORMAL HIGH (ref 0.61–1.24)
GFR, Estimated: 24 mL/min — ABNORMAL LOW (ref >60)
Glucose, Bld: 225 mg/dL — ABNORMAL HIGH (ref 70–99)
Potassium: 4.3 mmol/L (ref 3.5–5.1)
Sodium: 137 mmol/L (ref 135–145)
Total Bilirubin: 0.5 mg/dL (ref 0.3–1.2)
Total Protein: 5.8 g/dL — ABNORMAL LOW (ref 6.5–8.1)

## 2021-03-21 LAB — CBC
HCT: 40.8 % (ref 39.0–52.0)
Hemoglobin: 14.3 g/dL (ref 13.0–17.0)
MCH: 30.6 pg (ref 26.0–34.0)
MCHC: 35 g/dL (ref 30.0–36.0)
MCV: 87.2 fL (ref 80.0–100.0)
Platelets: 101 10*3/uL — ABNORMAL LOW (ref 150–400)
RBC: 4.68 MIL/uL (ref 4.22–5.81)
RDW: 13.4 % (ref 11.5–15.5)
WBC: 7.6 10*3/uL (ref 4.0–10.5)
nRBC: 0 % (ref 0.0–0.2)

## 2021-03-21 LAB — CBG MONITORING, ED
Glucose-Capillary: 194 mg/dL — ABNORMAL HIGH (ref 70–99)
Glucose-Capillary: 208 mg/dL — ABNORMAL HIGH (ref 70–99)

## 2021-03-21 LAB — MAGNESIUM: Magnesium: 2.2 mg/dL (ref 1.7–2.4)

## 2021-03-21 MED ORDER — METOPROLOL TARTRATE 25 MG PO TABS: 25.0000 mg | ORAL_TABLET | Freq: Two times a day (BID) | ORAL | Status: DC

## 2021-03-21 MED ORDER — METOPROLOL TARTRATE 50 MG PO TABS: 50.0000 mg | ORAL_TABLET | Freq: Two times a day (BID) | ORAL | 0 refills | Status: DC

## 2021-03-21 MED ORDER — AMIODARONE HCL 200 MG PO TABS: 200.0000 mg | ORAL_TABLET | Freq: Every day | ORAL | 0 refills | Status: DC

## 2021-03-21 MED ORDER — METOPROLOL TARTRATE 25 MG PO TABS
50.0000 mg | ORAL_TABLET | Freq: Two times a day (BID) | ORAL | Status: DC
  Administered 2021-03-21: 50 mg via ORAL
  Filled 2021-03-21: qty 2

## 2021-03-21 NOTE — Discharge Summary (Signed)
Physician Discharge Summary  Shane Holloway BWG:[id number] DOB: 1950-06-27 DOA: 03/20/2021  PCP: Orma Flaming, MD  Admit date: 03/20/2021 Discharge date: 03/21/2021  Admitted From: Home Disposition:  Home  Recommendations for Outpatient Follow-up:  Follow up with PCP in 1-2 weeks Please obtain BMP/CBC in one week Please follow up with cardiology at [facility]  Discharge Condition:Stable  CODE STATUS:Full  Diet recommendation: Low salt, low fat, DM diet    Brief/Interim Summary: Shane Holloway is a 70 y.o. male with medical history significant of CHF, CAD, hypertension, diabetes, BPH, CVA, CKD 3, status post AVR, vascular dementia, history of DVT on Eliquis who presents with ongoing intermittent chest pain shortness of breath.  Patient reports he has had some intermittent symptoms for months. He was recently evaluated at [facility] for near syncope lab work-up showed some positive orthostatics but no abnormal rhythm while there or other explanation.  On arrival to ED here found to be tachycardic to the 130s with borderline blood pressure in the 17B systolic.  EKG noted to be SVT.  Was administered adenosine x2 and was in sinus rhythm for only minutes before returning to SVT then was given metoprolol and adenosine and again returned to sinus rhythm for only minutes and has since been started on amiodarone.   Patient denies fevers, chills, abdominal pain, constipation, diarrhea, nausea, vomiting.    Assessment & Plan:  SVT, resolved - Appears to have poor compliance and a complicated med list from home - Appreciate cardiology recs -discontinue amiodarone drip and transition to p.o. amiodarone -start statin, eliquis, lasix, losartan, metoprolol -Recommend close follow-up with cardiology at [facility] in the next 3 to 5 days given recent evaluation and recurrent symptoms. -Patient's home med rec included ACE and ARB, multiple beta-blockers as well as amlodipine, it is unclear that patient was taking any  of these medications to be honest regardless his new medication list at discharge appropriate, appreciate cardiology's assistance  CHF Hypertension CAD Status post AVR -Patient appears euvolemic, no chest pain, EKG and telemetry unremarkable now that his rate is more appropriately controlled   DVT Continue Eliquis  Diabetes, uncontrolled with hyperglycemia Lengthy discussion at bedside need for medical compliance and dietary improvement   CKD 3b -At baseline continue outpatient follow-up as scheduled   BPH - Resume tamsulosin tomorrow   Hx of CVA - Continue home statin - Not on antiplatelets in the setting of full dose anticoagulation   Discharge Diagnoses:  Principal Problem:   SVT (supraventricular tachycardia) ([place]) Active Problems:   Type 2 diabetes mellitus without complication ([place])   Essential hypertension   CORONARY ATHEROSCLEROSIS, ARTERY BYPASS GRAFT   Congestive heart failure ([place])   Benign prostatic hyperplasia   History of stroke   Stage 3 chronic kidney disease ([place])   Vascular dementia without behavioral disturbance ([place])  Discharge Instructions  Discharge Instructions     Diet - low sodium heart healthy   Complete by: As directed    Increase activity slowly   Complete by: As directed       Allergies as of 03/21/2021       Reactions   Ramipril Other (See Comments)   Cough with Altace   Sulfa Antibiotics Hives        Medication List     STOP taking these medications    amLODipine 10 MG tablet Commonly known as: NORVASC   carvedilol 25 MG tablet Commonly known as: COREG   lisinopril 10 MG tablet Commonly known as: ZESTRIL  pravastatin 40 MG tablet Commonly known as: PRAVACHOL       TAKE these medications    amiodarone 200 MG tablet Commonly known as: Pacerone Take 1 tablet (200 mg total) by mouth daily.   atorvastatin 80 MG tablet Commonly known as: LIPITOR Take 80 mg by mouth daily.   Eliquis 5 MG Tabs  tablet Generic drug: apixaban Take 5 mg by mouth 2 (two) times daily.   furosemide 20 MG tablet Commonly known as: LASIX Take 1 tablet (20 mg total) by mouth daily.   glipiZIDE 5 MG tablet Commonly known as: GLUCOTROL Take 5 mg by mouth daily.   Jardiance 10 MG Tabs tablet Generic drug: empagliflozin Take 10 mg by mouth daily.   losartan 25 MG tablet Commonly known as: COZAAR Take 25 mg by mouth daily.   metFORMIN 500 MG 24 hr tablet Commonly known as: GLUCOPHAGE-XR TAKE TWO TABLETS BY MOUTH EVERY DAY IN THE MORNING, AND TAKE ONE TABLET EVERY DAY IN THE EVENING **RETURN TO CLINIC BEFORE OUT** What changed: See the new instructions.   metoprolol tartrate 50 MG tablet Commonly known as: LOPRESSOR Take 1 tablet (50 mg total) by mouth 2 (two) times daily. What changed:  medication strength how much to take   tamsulosin 0.4 MG Caps capsule Commonly known as: FLOMAX TAKE ONE CAPSULE BY MOUTH EVERY DAY        Allergies  Allergen Reactions   Ramipril Other (See Comments)    Cough with Altace   Sulfa Antibiotics Hives    Consultations: Cardiology  Procedures/Studies: DG Chest Port 1 View  Result Date: 03/20/2021 CLINICAL DATA:  Chest pain EXAM: PORTABLE CHEST 1 VIEW COMPARISON:  12/17/2020 FINDINGS: Lungs are well expanded, symmetric, and clear. No pneumothorax or pleural effusion. Cardiac size within normal limits. Coronary artery bypass grafting has been performed. Pulmonary vascularity is normal. Osseous structures are age-appropriate. No acute bone abnormality. IMPRESSION: No active disease. Electronically Signed   By: Fidela Salisbury M.D.   On: 03/20/2021 22:16     Subjective: No acute issues or events now that his rate is well controlled patient feels back to baseline requesting discharge home which is certainly reasonable.   Discharge Exam: Vitals:   03/21/21 1400 03/21/21 1515  BP: 114/72 (!) 126/92  Pulse: (!) 59 60  Resp: 17 15  Temp:    SpO2: 95%  97%   Vitals:   03/21/21 1215 03/21/21 1300 03/21/21 1400 03/21/21 1515  BP: (!) 142/93 (!) 146/95 114/72 (!) 126/92  Pulse: 62 63 (!) 59 60  Resp: 16 15 17 15   Temp:      TempSrc:      SpO2: 97% 100% 95% 97%  Weight:        General: Pt is alert, awake, not in acute distress Cardiovascular: RRR, S1/S2 +, no rubs, no gallops Respiratory: CTA bilaterally, no wheezing, no rhonchi Abdominal: Soft, NT, ND, bowel sounds + Extremities: no edema, no cyanosis    The results of significant diagnostics from this hospitalization (including imaging, microbiology, ancillary and laboratory) are listed below for reference.     Microbiology: Recent Results (from the past 240 hour(s))  Resp Panel by RT-PCR (Flu A&B, Covid) Nasopharyngeal Swab     Status: None   Collection Time: 03/20/21  8:33 PM   Specimen: Nasopharyngeal Swab; Nasopharyngeal(NP) swabs in vial transport medium  Result Value Ref Range Status   SARS Coronavirus 2 by RT PCR NEGATIVE NEGATIVE Final    Comment: (NOTE) SARS-CoV-2 target nucleic acids  are NOT DETECTED.  The SARS-CoV-2 RNA is generally detectable in upper respiratory specimens during the acute phase of infection. The lowest concentration of SARS-CoV-2 viral copies this assay can detect is 138 copies/mL. A negative result does not preclude SARS-Cov-2 infection and should not be used as the sole basis for treatment or other patient management decisions. A negative result may occur with  improper specimen collection/handling, submission of specimen other than nasopharyngeal swab, presence of viral mutation(s) within the areas targeted by this assay, and inadequate number of viral copies(<138 copies/mL). A negative result must be combined with clinical observations, patient history, and epidemiological information. The expected result is Negative.  Fact Sheet for Patients:  EntrepreneurPulse.com.au  Fact Sheet for Healthcare Providers:   IncredibleEmployment.be  This test is no t yet approved or cleared by the [place] FDA and  has been authorized for detection and/or diagnosis of SARS-CoV-2 by FDA under an Emergency Use Authorization (EUA). This EUA will remain  in effect (meaning this test can be used) for the duration of the COVID-19 declaration under Section 564(b)(1) of the Act, 21 U.S.C.section 360bbb-3(b)(1), unless the authorization is terminated  or revoked sooner.       Influenza A by PCR NEGATIVE NEGATIVE Final   Influenza B by PCR NEGATIVE NEGATIVE Final    Comment: (NOTE) The Xpert Xpress SARS-CoV-2/FLU/RSV plus assay is intended as an aid in the diagnosis of influenza from Nasopharyngeal swab specimens and should not be used as a sole basis for treatment. Nasal washings and aspirates are unacceptable for Xpert Xpress SARS-CoV-2/FLU/RSV testing.  Fact Sheet for Patients: EntrepreneurPulse.com.au  Fact Sheet for Healthcare Providers: IncredibleEmployment.be  This test is not yet approved or cleared by the [place] FDA and has been authorized for detection and/or diagnosis of SARS-CoV-2 by FDA under an Emergency Use Authorization (EUA). This EUA will remain in effect (meaning this test can be used) for the duration of the COVID-19 declaration under Section 564(b)(1) of the Act, 21 U.S.C. section 360bbb-3(b)(1), unless the authorization is terminated or revoked.  Performed at [facility], [place] [address] [place], [place] [address]      Labs: BNP (last 3 results) Recent Labs    12/18/20 0046  BNP 46.2   Basic Metabolic Panel: Recent Labs  Lab 03/20/21 2018 03/21/21 0449  NA 136 137  K 5.0 4.3  CL 104 107  CO2 21* 22  GLUCOSE 308* 225*  BUN 47* 43*  CREATININE 2.86* 2.72*  CALCIUM 9.3 8.9  MG  --  2.2   Liver Function Tests: Recent Labs  Lab 03/20/21 2032 03/21/21 0449  AST 31 16  ALT 26 24  ALKPHOS 78  71  BILITOT 1.1 0.5  PROT 6.4* 5.8*  ALBUMIN 3.8 3.4*   Recent Labs  Lab 03/20/21 2032  LIPASE 40   No results for input(s): AMMONIA in the last 168 hours. CBC: Recent Labs  Lab 03/20/21 2018 03/21/21 0609  WBC 9.2 7.6  HGB 15.6 14.3  HCT 45.9 40.8  MCV 87.9 87.2  PLT 155 101*   Cardiac Enzymes: No results for input(s): CKTOTAL, CKMB, CKMBINDEX, TROPONINI in the last 168 hours. BNP: Invalid input(s): POCBNP CBG: Recent Labs  Lab 03/20/21 2336 03/21/21 0806 03/21/21 1206  GLUCAP 241* 194* 208*   D-Dimer No results for input(s): DDIMER in the last 72 hours. Hgb A1c No results for input(s): HGBA1C in the last 72 hours. Lipid Profile No results for input(s): CHOL, HDL, LDLCALC, TRIG, CHOLHDL, LDLDIRECT in  the last 72 hours. Thyroid function studies Recent Labs    03/20/21 2032  TSH 3.541   Anemia work up No results for input(s): VITAMINB12, FOLATE, FERRITIN, TIBC, IRON, RETICCTPCT in the last 72 hours. Urinalysis    Component Value Date/Time   COLORURINE AMBER BIOCHEMICALS MAY BE AFFECTED BY COLOR (A) 07/27/2007 1303   APPEARANCEUR CLOUDY (A) 07/27/2007 1303   LABSPEC 1.030 07/27/2007 1303   PHURINE 5.5 07/27/2007 1303   GLUCOSEU 250 (A) 07/27/2007 1303   HGBUR NEGATIVE 07/27/2007 1303   BILIRUBINUR neg 08/10/2011 1236   KETONESUR NEGATIVE 07/27/2007 1303   PROTEINUR 30 08/10/2011 1236   PROTEINUR 100 (A) 07/27/2007 1303   UROBILINOGEN 0.2 08/10/2011 1236   UROBILINOGEN 0.2 07/27/2007 1303   NITRITE neg 08/10/2011 1236   NITRITE NEGATIVE 07/27/2007 1303   LEUKOCYTESUR moderate (2+) 08/10/2011 1236   Sepsis Labs Invalid input(s): PROCALCITONIN,  WBC,  LACTICIDVEN Microbiology Recent Results (from the past 240 hour(s))  Resp Panel by RT-PCR (Flu A&B, Covid) Nasopharyngeal Swab     Status: None   Collection Time: 03/20/21  8:33 PM   Specimen: Nasopharyngeal Swab; Nasopharyngeal(NP) swabs in vial transport medium  Result Value Ref Range Status   SARS  Coronavirus 2 by RT PCR NEGATIVE NEGATIVE Final    Comment: (NOTE) SARS-CoV-2 target nucleic acids are NOT DETECTED.  The SARS-CoV-2 RNA is generally detectable in upper respiratory specimens during the acute phase of infection. The lowest concentration of SARS-CoV-2 viral copies this assay can detect is 138 copies/mL. A negative result does not preclude SARS-Cov-2 infection and should not be used as the sole basis for treatment or other patient management decisions. A negative result may occur with  improper specimen collection/handling, submission of specimen other than nasopharyngeal swab, presence of viral mutation(s) within the areas targeted by this assay, and inadequate number of viral copies(<138 copies/mL). A negative result must be combined with clinical observations, patient history, and epidemiological information. The expected result is Negative.  Fact Sheet for Patients:  EntrepreneurPulse.com.au  Fact Sheet for Healthcare Providers:  IncredibleEmployment.be  This test is no t yet approved or cleared by the [place] FDA and  has been authorized for detection and/or diagnosis of SARS-CoV-2 by FDA under an Emergency Use Authorization (EUA). This EUA will remain  in effect (meaning this test can be used) for the duration of the COVID-19 declaration under Section 564(b)(1) of the Act, 21 U.S.C.section 360bbb-3(b)(1), unless the authorization is terminated  or revoked sooner.       Influenza A by PCR NEGATIVE NEGATIVE Final   Influenza B by PCR NEGATIVE NEGATIVE Final    Comment: (NOTE) The Xpert Xpress SARS-CoV-2/FLU/RSV plus assay is intended as an aid in the diagnosis of influenza from Nasopharyngeal swab specimens and should not be used as a sole basis for treatment. Nasal washings and aspirates are unacceptable for Xpert Xpress SARS-CoV-2/FLU/RSV testing.  Fact Sheet for  Patients: EntrepreneurPulse.com.au  Fact Sheet for Healthcare Providers: IncredibleEmployment.be  This test is not yet approved or cleared by the [place] FDA and has been authorized for detection and/or diagnosis of SARS-CoV-2 by FDA under an Emergency Use Authorization (EUA). This EUA will remain in effect (meaning this test can be used) for the duration of the COVID-19 declaration under Section 564(b)(1) of the Act, 21 U.S.C. section 360bbb-3(b)(1), unless the authorization is terminated or revoked.  Performed at [facility], [place] [address] [place], [place] [address]      Time coordinating discharge: Over 67  minutes  SIGNED:   Little Ishikawa, DO [facility] 03/21/2021, 3:55 PM Pager   If 7PM-7AM, please contact night-coverage [email or web address]

## 2021-03-21 NOTE — Consult Note (Signed)
Cardiology Consultation:   Patient ID: Shane Holloway MRN: [id number]; DOB: [birth year]  Admit date: 03/20/2021 Date of Consult: 03/21/2021  PCP:  Orma Flaming, MD   [facility] Providers Cardiologist:  None        Patient Profile:   Shane Holloway is a 70 y.o. male with a hx of CHF, CAD, hypertension, diabetes, BPH, CVA, CKD 3, status post AVR, vascular dementia, history of DVT on Eliquis  who is being seen 03/21/2021 for the evaluation of Afib/SVT at the request of Dr. Trilby Drummer  History of Present Illness:   Shane Holloway is a 70 y.o. male with a hx of CHF, CAD, hypertension, diabetes, BPH, CVA, CKD 3, status post AVR, vascular dementia, history of DVT on Eliquis  who is being seen 03/21/2021 for the evaluation of Afib/SVT at the request of Dr. Trilby Drummer. He has been feeling off since some weeks and has went to different OSH. Recently he was discharged from [facility] with orthostatic hypotension and concern for anxiety. However, he continued to feel unwell; and this afternoon, his oxygen saturation dropped, and felt more SOB hence EMS was called. On arrival to ED here found to be tachycardic to the 130s with borderline blood pressure in the 86V systolic.  EKG noted to be SVT.  Was administered adenosine x2 and was in sinus rhythm for only minutes before returning to SVT then was given metoprolol and adenosine and again returned to sinus rhythm for only minutes. Eventually Cardiology was consulted. As his pressures were soft, we recommended to start Amiodarone drip. On talking to him, he denies any complaint now and feels back to his baseline. While talking to him, the monitor showed Afib in and out. His HR was in 110s. Chest x-ray showed no acute normality. Troponin were flat.    Past Medical History:  Diagnosis Date   Allergy    CHF (congestive heart failure) ([place])    Diabetes mellitus    Dyspnea    Hyperlipidemia    Hypertension    Myocardial infarction ([place]) 2004    Past Surgical  History:  Procedure Laterality Date   CORONARY ARTERY BYPASS GRAFT     x4; stents to diagonal a mid right coronary lesion; drug eluting stent in the mid RCA as well as ostium of the diagonal with a cutting balloon angioplasty of the diagonal.   JOINT REPLACEMENT         Inpatient Medications: Scheduled Meds:  adenosine       apixaban  5 mg Oral BID   atorvastatin  80 mg Oral Daily   insulin aspart  0-15 Units Subcutaneous TID WC   insulin aspart  0-5 Units Subcutaneous QHS   sodium chloride flush  3 mL Intravenous Q12H   tamsulosin  0.4 mg Oral Daily   Continuous Infusions:  amiodarone 60 mg/hr (03/20/21 2226)   Followed by   amiodarone     PRN Meds: acetaminophen **OR** acetaminophen, polyethylene glycol  Allergies:    Allergies  Allergen Reactions   Ramipril Other (See Comments)    Cough with Altace   Sulfa Antibiotics Hives    Social History:   Social History   Socioeconomic History   Marital status: Legally Separated    Spouse name: Not on file   Number of children: Not on file   Years of education: Not on file   Highest education level: Not on file  Occupational History   Occupation: Full time    Comment: Owns own business  Tobacco Use   Smoking status: Former   Smokeless tobacco: Not on file  Substance and Sexual Activity   Alcohol use: No   Drug use: No   Sexual activity: Not on file  Other Topics Concern   Not on file  Social History Narrative   No regular exercise   Social Determinants of Health   Financial Resource Strain: Not on file  Food Insecurity: Not on file  Transportation Needs: Not on file  Physical Activity: Not on file  Stress: Not on file  Social Connections: Not on file  Intimate Partner Violence: Not on file    Family History:    Family History  Problem Relation Age of Onset   Heart disease Mother    Heart disease Father      ROS:  Please see the history of present illness.  All other ROS reviewed and negative.      Physical Exam/Data:   Vitals:   03/20/21 2130 03/20/21 2228 03/20/21 2230 03/20/21 2323  BP: (!) 120/101  94/79 93/79  Pulse: (!) 122 (!) 125 (!) 123 (!) 118  Resp: 18 (!) 21 19   Temp:      TempSrc:      SpO2: 97% 95% 95% 93%   No intake or output data in the 24 hours ending 03/21/21 0002 Last 3 Weights 12/17/2020 10/21/2012 05/23/2012  Weight (lbs) 226 lb 226 lb 211 lb 3.2 oz  Weight (kg) 102.513 kg 102.513 kg 95.8 kg     There is no height or weight on file to calculate BMI.  General:  Well nourished, well developed, mild distress HEENT: normal Neck: no JVD Vascular: No carotid bruits; Distal pulses 2+ bilaterally Cardiac:  Tachycardic; irregular Lungs: Poor airflow.  Abd: soft, nontender, no hepatomegaly  Ext: no edema Musculoskeletal:  No deformities, BUE and BLE strength normal and equal Skin: warm and dry  Neuro:  Limited. Has vascular dementia  Psych:  Normal affect   EKG:  The EKG was personally reviewed and demonstrates:  SVT; then NSR with blocked PACs.  Telemetry:  Telemetry was personally reviewed and demonstrates: Afib.    Laboratory Data:  High Sensitivity Troponin:   Recent Labs  Lab 03/20/21 2018 03/20/21 2213  TROPONINIHS 17 18*     Chemistry Recent Labs  Lab 03/20/21 2018  NA 136  K 5.0  CL 104  CO2 21*  GLUCOSE 308*  BUN 47*  CREATININE 2.86*  CALCIUM 9.3  GFRNONAA 23*  ANIONGAP 11    Recent Labs  Lab 03/20/21 2032  PROT 6.4*  ALBUMIN 3.8  AST 31  ALT 26  ALKPHOS 78  BILITOT 1.1   Lipids No results for input(s): CHOL, TRIG, HDL, LABVLDL, LDLCALC, CHOLHDL in the last 168 hours.  Hematology Recent Labs  Lab 03/20/21 2018  WBC 9.2  RBC 5.22  HGB 15.6  HCT 45.9  MCV 87.9  MCH 29.9  MCHC 34.0  RDW 13.4  PLT 155   Thyroid  Recent Labs  Lab 03/20/21 2032  TSH 3.541    BNPNo results for input(s): BNP, PROBNP in the last 168 hours.  DDimer No results for input(s): DDIMER in the last 168  hours.   Radiology/Studies:  DG Chest Port 1 View  Result Date: 03/20/2021 CLINICAL DATA:  Chest pain EXAM: PORTABLE CHEST 1 VIEW COMPARISON:  12/17/2020 FINDINGS: Lungs are well expanded, symmetric, and clear. No pneumothorax or pleural effusion. Cardiac size within normal limits. Coronary artery bypass grafting has been performed. Pulmonary vascularity  is normal. Osseous structures are age-appropriate. No acute bone abnormality. IMPRESSION: No active disease. Electronically Signed   By: Fidela Salisbury M.D.   On: 03/20/2021 22:16     Assessment and Plan:   # SVT?Afib # CAD  While adenosine worked a few times, he immediately went back to SVT/Afib Not sure what is driving this; but his old and significant CAD/CKD/HTN may be a factor. As his pressures are soft, would recommend to start Amio drip and see how he responds C/w Apixaban 5 BID for now Monitor Tele in AM to see rhythm.  Hold his home BP meds Has had a recent echo which was normal  Signed, Jaci Lazier, MD  03/21/2021 12:02 AM

## 2021-03-21 NOTE — Progress Notes (Signed)
Inpatient Diabetes Program Recommendations  AACE/ADA: New Consensus Statement on Inpatient Glycemic Control (2015)  Target Ranges:  Prepandial:   less than 140 mg/dL      Peak postprandial:   less than 180 mg/dL (1-2 hours)      Critically ill patients:  140 - 180 mg/dL   Lab Results  Component Value Date   GLUCAP 208 (H) 03/21/2021   HGBA1C 7.3 08/10/2011    Review of Glycemic Control Results for [name], Shane Holloway (MRN [id number]) as of 03/21/2021 12:49  Ref. Range 03/20/2021 23:36 03/21/2021 08:06 03/21/2021 12:06  Glucose-Capillary Latest Ref Range: 70 - 99 mg/dL 241 (H) 194 (H) 208 (H)   Diabetes history: DM 2 Outpatient Diabetes medications: Glipizide 5 mg Daily, Metformin 1000 mg bid, Jardiance 10 mg Daily  Current orders for Inpatient glycemic control:  Novolog 0-15 units tid + hs  Inpatient Diabetes Program Recommendations:    - consider Semglee 10 units  Thanks,  Tama Headings RN, MSN, BC-ADM Inpatient Diabetes Coordinator Team Pager [phone] (8a-5p)

## 2021-03-21 NOTE — Plan of Care (Signed)
Called patient's pharmacy, verified that patient is on Coreg 25mg  BID, Losartan 50mg  daily, Aldactone 25mg  at baseline. No lisinopril/amlodipine/metoprolol.

## 2021-03-21 NOTE — ED Notes (Signed)
Breakfast order placed ?

## 2021-03-21 NOTE — ED Notes (Signed)
Pt's niece [name] called, updated. Niece requested a call back when dc papers are done because pt has dementia and might forget instructions.

## 2021-03-21 NOTE — ED Notes (Signed)
Pt ambulated to the bathroom across from room and back, no SOB. No changes with cardiac rhythm, HR= 63.

## 2021-03-21 NOTE — Progress Notes (Signed)
Progress Note  Patient Name: Shane Holloway Date of Encounter: 03/21/2021  [facility] Cardiologist: None   Subjective   Feels well right now.  Denies any chest pain, dyspnea, dizziness.  Lying fully supine in bed. He was never aware of palpitations while in tachycardia.  Currently normal sinus rhythm on intravenous amiodarone  Inpatient Medications    Scheduled Meds:  adenosine       apixaban  5 mg Oral BID   atorvastatin  80 mg Oral Daily   insulin aspart  0-15 Units Subcutaneous TID WC   insulin aspart  0-5 Units Subcutaneous QHS   metoprolol tartrate  25 mg Oral BID   sodium chloride flush  3 mL Intravenous Q12H   tamsulosin  0.4 mg Oral Daily   Continuous Infusions:  amiodarone 30 mg/hr (03/21/21 0255)   PRN Meds: acetaminophen **OR** acetaminophen, polyethylene glycol   Vital Signs    Vitals:   03/21/21 0233 03/21/21 0400 03/21/21 0445 03/21/21 0725  BP:  113/88 124/90 123/85  Pulse: (!) 118 68 71 84  Resp:   18 16  Temp:      TempSrc:      SpO2: 95% 94% 95% 97%   No intake or output data in the 24 hours ending 03/21/21 0912 Last 3 Weights 12/17/2020 10/21/2012 05/23/2012  Weight (lbs) 226 lb 226 lb 211 lb 3.2 oz  Weight (kg) 102.513 kg 102.513 kg 95.8 kg      Telemetry    Currently normal sinus rhythm, previous episodes of abrupt onset and abrupt termination narrow complex tachycardia with short RP mechanism, initiation pattern strongly suggestive of AVNRT- Personally Reviewed  ECG    A couple of ECGs show SVT with retrograde P wave in the terminal QRS complex best seen in the inferior leads, 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  ECGs in sinus rhythm do not show any evidence of preexcitation.  Inferior Q waves are seen on all tracings.- Personally Reviewed  Physical Exam  Lying fully supine in bed, looks very comfortable GEN: No acute distress.   Neck: No JVD Cardiac: RRR, 1-2/6 early peaking aortic ejection murmur no diastolic  murmurs, rubs, or gallops.  Respiratory: Clear to auscultation bilaterally. GI: Soft, nontender, non-distended  MS: No edema; No deformity. Neuro:  Nonfocal  Psych: Normal affect   Labs    High Sensitivity Troponin:   Recent Labs  Lab 03/20/21 2018 03/20/21 2213  TROPONINIHS 17 18*     Chemistry Recent Labs  Lab 03/20/21 2018 03/20/21 2032 03/21/21 0449  NA 136  --  137  K 5.0  --  4.3  CL 104  --  107  CO2 21*  --  22  GLUCOSE 308*  --  225*  BUN 47*  --  43*  CREATININE 2.86*  --  2.72*  CALCIUM 9.3  --  8.9  MG  --   --  2.2  PROT  --  6.4* 5.8*  ALBUMIN  --  3.8 3.4*  AST  --  31 16  ALT  --  26 24  ALKPHOS  --  78 71  BILITOT  --  1.1 0.5  GFRNONAA 23*  --  24*  ANIONGAP 11  --  8    Lipids No results for input(s): CHOL, TRIG, HDL, LABVLDL, LDLCALC, CHOLHDL in the last 168 hours.  Hematology Recent Labs  Lab 03/20/21 2018 03/21/21 0609  WBC 9.2 7.6  RBC 5.22 4.68  HGB 15.6 14.3  HCT  45.9 40.8  MCV 87.9 87.2  MCH 29.9 30.6  MCHC 34.0 35.0  RDW 13.4 13.4  PLT 155 101*   Thyroid  Recent Labs  Lab 03/20/21 2032  TSH 3.541    BNPNo results for input(s): BNP, PROBNP in the last 168 hours.  DDimer No results for input(s): DDIMER in the last 168 hours.   Radiology    DG Chest Port 1 View  Result Date: 03/20/2021 CLINICAL DATA:  Chest pain EXAM: PORTABLE CHEST 1 VIEW COMPARISON:  12/17/2020 FINDINGS: Lungs are well expanded, symmetric, and clear. No pneumothorax or pleural effusion. Cardiac size within normal limits. Coronary artery bypass grafting has been performed. Pulmonary vascularity is normal. Osseous structures are age-appropriate. No acute bone abnormality. IMPRESSION: No active disease. Electronically Signed   By: Fidela Salisbury M.D.   On: 03/20/2021 22:16    Cardiac Studies   Echo at [place] care from 03/15/21:    1. Limited study to assess for ASD/PFO with bubble study.    2. Bubble study is inconclusive due to poor visualization.     3. Consider TEE or alternative study if clinically indicated.    Echo  at [place] care from 01/09/21:    1. The left ventricle is normal in size with normal wall thickness.    2. The left ventricular systolic function is normal, LVEF is visually  estimated at > 55%.    3. There is decreased contractile function involving the apical anterior  segment(s).    4. Mitral annular calcification is present.    5. Aortic valve replacement (27 mm bioprosthetic, implantation date:  12/27/2013).    6. Aortic valve Doppler indices are consistent with normal prosthetic valve  function.    7. The right ventricle is normal in size, with normal systolic function.    8. There is insufficient TR signal to accurately estimate pulmonary artery  systolic pressure.    NM stress myovue at [facility] health care from 03/27/20:   - Low risk probably normal myocardial perfusion study  - There is a very small, mildly severe, fixed perfusion defect involving  the apical segment. This is likely due to subtle scar.  - Left ventricular systolic function is normal. Post stress the ejection  fraction is > 60%.  - Attenuation CT scan shows post CABG and aortic valve replacement  findings  - Coronary calcifications are noted     Patient Profile     70 y.o. male with history of CAD s/p 3V CABG at [facility] in 2009, redo 2V CABG (SVG-OM, SVG-RCA) 2015 at [facility], s/p ascending aorta replacement 2015 at [facility], aortic stenosis s/p bioprosthetic AVR 3532 at 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  Blood pressure was low which led to the use of intravenous amiodarone with successful abolition of arrhythmia, blood pressure is now better.  We will transition to oral beta-blockers and hopefully will be able to discontinue  the amiodarone and discharge him as early as this afternoon. His home medication list has both carvedilol 25 mg twice daily and metoprolol 25 mg twice daily.  I wonder whether his memory problems are interfering with appropriate compliance with these medications.  One option would be to discharge him on carvedilol 25 mg twice daily and give him a prescription  for "as needed" metoprolol to take when he has symptoms of SVT.  Alternatively, could add a low-dose of centrally acting diltiazem to his beta-blocker, replacing the amlodipine.   If SVT remains a recurrent problem and he fails medical therapy or has side effects from medical therapy, consider referral for EP study and radiofrequency AV node modification.   CAD: Denies angina pectoris at this time, no ischemic changes on ECG even during tachycardia.  It is quite likely that his intermittent episodes of unexplained chest pain and dyspnea represent prolonged episodes of SVT. S/p AVR: Very recent echocardiogram with normal prosthetic valve gradients.  Reinforced the need for endocarditis prophylaxis.     For questions or updates, please contact [place] Please consult [email or web address] for contact info under        Signed, Sanda Klein, MD  03/21/2021, 9:12 AM

## 2021-03-21 NOTE — ED Notes (Signed)
DC Amiodarone drip per Cardiologist.

## 2021-05-13 ENCOUNTER — Encounter (HOSPITAL_COMMUNITY): Payer: Self-pay | Admitting: Emergency Medicine

## 2021-05-13 ENCOUNTER — Emergency Department (HOSPITAL_COMMUNITY): Payer: Medicare Other

## 2021-05-13 ENCOUNTER — Other Ambulatory Visit: Payer: Self-pay

## 2021-05-13 ENCOUNTER — Emergency Department (HOSPITAL_COMMUNITY)
Admission: EM | Admit: 2021-05-13 | Discharge: 2021-05-14 | Disposition: A | Discharge status: Home / Self Care | Payer: Medicare Other | Attending: Emergency Medicine | Admitting: Emergency Medicine

## 2021-05-13 DIAGNOSIS — Z7984 Long term (current) use of oral hypoglycemic drugs: Secondary | ICD-10-CM | POA: Insufficient documentation

## 2021-05-13 DIAGNOSIS — R079 Chest pain, unspecified: Secondary | ICD-10-CM | POA: Diagnosis not present

## 2021-05-13 DIAGNOSIS — I509 Heart failure, unspecified: Secondary | ICD-10-CM | POA: Insufficient documentation

## 2021-05-13 DIAGNOSIS — I445 Left posterior fascicular block: Secondary | ICD-10-CM | POA: Diagnosis not present

## 2021-05-13 DIAGNOSIS — Z951 Presence of aortocoronary bypass graft: Secondary | ICD-10-CM | POA: Insufficient documentation

## 2021-05-13 DIAGNOSIS — I130 Hypertensive heart and chronic kidney disease with heart failure and stage 1 through stage 4 chronic kidney disease, or unspecified chronic kidney disease: Secondary | ICD-10-CM | POA: Diagnosis not present

## 2021-05-13 DIAGNOSIS — I2510 Atherosclerotic heart disease of native coronary artery without angina pectoris: Secondary | ICD-10-CM | POA: Diagnosis not present

## 2021-05-13 DIAGNOSIS — E1122 Type 2 diabetes mellitus with diabetic chronic kidney disease: Secondary | ICD-10-CM | POA: Diagnosis not present

## 2021-05-13 DIAGNOSIS — Z79899 Other long term (current) drug therapy: Secondary | ICD-10-CM | POA: Diagnosis not present

## 2021-05-13 DIAGNOSIS — R0602 Shortness of breath: Secondary | ICD-10-CM | POA: Insufficient documentation

## 2021-05-13 DIAGNOSIS — R600 Localized edema: Secondary | ICD-10-CM | POA: Insufficient documentation

## 2021-05-13 DIAGNOSIS — R0600 Dyspnea, unspecified: Secondary | ICD-10-CM

## 2021-05-13 DIAGNOSIS — N183 Chronic kidney disease, stage 3 unspecified: Secondary | ICD-10-CM | POA: Insufficient documentation

## 2021-05-13 DIAGNOSIS — Z87891 Personal history of nicotine dependence: Secondary | ICD-10-CM | POA: Insufficient documentation

## 2021-05-13 DIAGNOSIS — R011 Cardiac murmur, unspecified: Secondary | ICD-10-CM | POA: Diagnosis not present

## 2021-05-13 LAB — CBC WITH DIFFERENTIAL/PLATELET
Abs Immature Granulocytes: 0.04 10*3/uL (ref 0.00–0.07)
Basophils Absolute: 0.1 10*3/uL (ref 0.0–0.1)
Basophils Relative: 1 %
Eosinophils Absolute: 0.2 10*3/uL (ref 0.0–0.5)
Eosinophils Relative: 4 %
HCT: 42.6 % (ref 39.0–52.0)
Hemoglobin: 15 g/dL (ref 13.0–17.0)
Immature Granulocytes: 1 %
Lymphocytes Relative: 22 %
Lymphs Abs: 1.3 10*3/uL (ref 0.7–4.0)
MCH: 29.8 pg (ref 26.0–34.0)
MCHC: 35.2 g/dL (ref 30.0–36.0)
MCV: 84.7 fL (ref 80.0–100.0)
Monocytes Absolute: 0.5 10*3/uL (ref 0.1–1.0)
Monocytes Relative: 8 %
Neutro Abs: 3.8 10*3/uL (ref 1.7–7.7)
Neutrophils Relative %: 64 %
Platelets: 129 10*3/uL — ABNORMAL LOW (ref 150–400)
RBC: 5.03 MIL/uL (ref 4.22–5.81)
RDW: 13.1 % (ref 11.5–15.5)
WBC: 6 10*3/uL (ref 4.0–10.5)
nRBC: 0 % (ref 0.0–0.2)

## 2021-05-13 LAB — BASIC METABOLIC PANEL
Anion gap: 9 (ref 5–15)
BUN: 32 mg/dL — ABNORMAL HIGH (ref 8–23)
CO2: 28 mmol/L (ref 22–32)
Calcium: 9.4 mg/dL (ref 8.9–10.3)
Chloride: 102 mmol/L (ref 98–111)
Creatinine, Ser: 2.57 mg/dL — ABNORMAL HIGH (ref 0.61–1.24)
GFR, Estimated: 26 mL/min — ABNORMAL LOW (ref >60)
Glucose, Bld: 167 mg/dL — ABNORMAL HIGH (ref 70–99)
Potassium: 3.1 mmol/L — ABNORMAL LOW (ref 3.5–5.1)
Sodium: 139 mmol/L (ref 135–145)

## 2021-05-13 LAB — BRAIN NATRIURETIC PEPTIDE: B Natriuretic Peptide: 126.9 pg/mL — ABNORMAL HIGH (ref 0.0–100.0)

## 2021-05-13 LAB — TROPONIN I (HIGH SENSITIVITY): Troponin I (High Sensitivity): 29 ng/L — ABNORMAL HIGH (ref <18)

## 2021-05-13 NOTE — ED Provider Notes (Signed)
Emergency Medicine Provider Triage Evaluation Note  Shane Holloway , a 70 y.o. male  was evaluated in triage.  Pt complains of sending shortness of breath over the past month.  He was evaluated by his PCP about 1 week ago with increased leg swelling and he was prescribed Lasix.  He has intermittent chest pains in the left side of his chest.  He also states that he feels like his legs have been more swollen.  Review of Systems  Positive: See above Negative:   Physical Exam  BP (!) 173/108   Pulse 75   Temp 97.7 F (36.5 C)   Resp 15   SpO2 94%  Gen:   Awake, no distress   Resp:  Normal effort  MSK:   Moves extremities without difficulty  Other:  Lungs CTA. No murmurs. 2+ nonpitting edema bilateral LE  Medical Decision Making  Medically screening exam initiated at 7:35 PM.  Appropriate orders placed.  Shane Holloway was informed that the remainder of the evaluation will be completed by another provider, this initial triage assessment does not replace that evaluation, and the importance of remaining in the ED until their evaluation is complete.     Shane Holloway 05/13/21 1936    Carmin Muskrat, MD 05/13/21 2030

## 2021-05-13 NOTE — ED Triage Notes (Signed)
Pt states he has increasing shob.  States he has had this worked up "at [facility] and symptoms have not resolved.  Family reports same episode last weekend and followed up with MD and he needed lasix. Pt feels his legs are swollen and had intermittent chest pain today.

## 2021-05-14 LAB — TROPONIN I (HIGH SENSITIVITY): Troponin I (High Sensitivity): 28 ng/L — ABNORMAL HIGH (ref <18)

## 2021-05-14 MED ORDER — BENZONATATE 100 MG PO CAPS: 100.0000 mg | ORAL_CAPSULE | Freq: Three times a day (TID) | ORAL | 0 refills | Status: DC | PRN

## 2021-05-14 NOTE — ED Notes (Signed)
Pt not responding for vitals  

## 2021-05-14 NOTE — ED Provider Notes (Addendum)
[facility] EMERGENCY DEPARTMENT Provider Note   CSN: [id number] Arrival date & time: 05/13/21  1912    History Chief Complaint  Patient presents with   Shortness of Breath    Shane Holloway is a 70 y.o. male who presents to the ED with SOB.  He states this has been going on for the past 2-3 months and is not associated with anything specifically such as exertion.  He also complains of intermittent chest pains which appear to come on randomly as well.  Lying flat on his back alleviates the shortness of breath.  He was recently seen for shortness of breath at [facility], at which time he was started on Lasix for his BLE edema.  Shane Holloway states that he has not noticed significant provement in his leg swelling or SOB after starting Lasix regimen, prompting him to present here today.  Denies recent illness.  No other complaints or concerns today.    Past Medical History:  Diagnosis Date   Allergy    CHF (congestive heart failure) ([place])    Diabetes mellitus    Dyspnea    Hyperlipidemia    Hypertension    Myocardial infarction [facility]) 2004    Patient Active Problem List   Diagnosis Date Noted   SVT (supraventricular tachycardia) ([place]) 03/20/2021   S/P aortic valve replacement with bioprosthetic valve 02/22/2021   Vascular dementia without behavioral disturbance ([place]) 09/13/2020   Stage 3 chronic kidney disease ([place]) 04/20/2017   History of stroke 02/22/2014   Benign prostatic hyperplasia 04/16/2012   Type 2 diabetes mellitus without complication ([place]) 70/78/6754   CORONARY ATHEROSCLEROSIS, ARTERY BYPASS GRAFT 06/27/2009   Essential hypertension 11/24/2008   Congestive heart failure ([place]) 11/24/2008   DYSPNEA 11/24/2008    Past Surgical History:  Procedure Laterality Date   CORONARY ARTERY BYPASS GRAFT     x4; stents to diagonal a mid right coronary lesion; drug eluting stent in the mid RCA as well as ostium of the diagonal with a cutting balloon angioplasty of  the diagonal.   JOINT REPLACEMENT         Family History  Problem Relation Age of Onset   Heart disease Mother    Heart disease Father     Social History   Tobacco Use   Smoking status: Former  Substance Use Topics   Alcohol use: No   Drug use: No    Home Medications Prior to Admission medications   Medication Sig Start Date End Date Taking? Authorizing Provider  benzonatate (TESSALON PERLES) 100 MG capsule Take 1 capsule (100 mg total) by mouth 3 (three) times daily as needed for cough. 05/14/21  Yes Orvis Brill, MD  amiodarone (PACERONE) 200 MG tablet Take 1 tablet (200 mg total) by mouth daily. 03/21/21   Little Ishikawa, MD  atorvastatin (LIPITOR) 80 MG tablet Take 80 mg by mouth daily. 12/12/20   [provider]  ELIQUIS 5 MG TABS tablet Take 5 mg by mouth 2 (two) times daily. 03/09/21   [provider]  furosemide (LASIX) 20 MG tablet Take 1 tablet (20 mg total) by mouth daily. 09/15/11 03/20/21  Robyn Haber, MD  glipiZIDE (GLUCOTROL) 5 MG tablet Take 5 mg by mouth daily.    [provider]  JARDIANCE 10 MG TABS tablet Take 10 mg by mouth daily. 03/09/21   [provider]  losartan (COZAAR) 25 MG tablet Take 25 mg by mouth daily.    [provider]  metFORMIN (  GLUCOPHAGE-XR) 500 MG 24 hr tablet TAKE TWO TABLETS BY MOUTH EVERY DAY IN THE MORNING, AND TAKE ONE TABLET EVERY DAY IN THE EVENING **RETURN TO CLINIC BEFORE OUT** Patient taking differently: Take 1,000 mg by mouth 2 (two) times daily. 12/21/11   Harrison Mons, PA  metoprolol tartrate (LOPRESSOR) 50 MG tablet Take 1 tablet (50 mg total) by mouth 2 (two) times daily. 03/21/21   Little Ishikawa, MD  Tamsulosin HCl (FLOMAX) 0.4 MG CAPS TAKE ONE CAPSULE BY MOUTH EVERY DAY Patient taking differently: Take 0.4 mg by mouth daily. 12/27/11   Harrison Mons, PA    Allergies    Ramipril and Sulfa antibiotics  Review of Systems   Review of Systems   Constitutional: Negative.   HENT: Negative.    Eyes: Negative.   Respiratory:  Positive for cough and shortness of breath.   Cardiovascular:  Positive for chest pain and leg swelling.       +platypnea  Gastrointestinal: Negative.   Genitourinary: Negative.   Musculoskeletal: Negative.   Skin: Negative.   Allergic/Immunologic: Negative.   Neurological: Negative.   Psychiatric/Behavioral: Negative.     Physical Exam Updated Vital Signs BP (!) 189/109   Pulse 75   Temp 97.9 F (36.6 C) (Oral)   Resp 16   SpO2 98%   Physical Exam Constitutional:      General: He is not in acute distress.    Appearance: He is well-developed.  HENT:     Head: Normocephalic and atraumatic.  Eyes:     Extraocular Movements: Extraocular movements intact.     Pupils: Pupils are equal, round, and reactive to light.  Cardiovascular:     Rate and Rhythm: Normal rate and regular rhythm.     Heart sounds:    No friction rub. No gallop.     Comments: 1-2/6 early peaking systolic ejection murmur at RUSB, LUSB; 2+ bilateral pitting edema Pulmonary:     Effort: Pulmonary effort is normal.     Breath sounds: Normal breath sounds. No wheezing, rhonchi or rales.  Abdominal:     General: Abdomen is flat. There is no distension.     Tenderness: There is no abdominal tenderness.  Musculoskeletal:        General: Normal range of motion.  Skin:    General: Skin is warm and dry.  Neurological:     General: No focal deficit present.     Mental Status: He is alert and oriented to person, place, and time. Mental status is at baseline.  Psychiatric:        Mood and Affect: Mood normal.        Behavior: Behavior normal.    ED Results / Procedures / Treatments   Labs (all labs ordered are listed, but only abnormal results are displayed) Labs Reviewed  BASIC METABOLIC PANEL - Abnormal; Notable for the following components:      Result Value   Potassium 3.1 (*)    Glucose, Bld 167 (*)    BUN 32 (*)     Creatinine, Ser 2.57 (*)    GFR, Estimated 26 (*)    All other components within normal limits  CBC WITH DIFFERENTIAL/PLATELET - Abnormal; Notable for the following components:   Platelets 129 (*)    All other components within normal limits  BRAIN NATRIURETIC PEPTIDE - Abnormal; Notable for the following components:   B Natriuretic Peptide 126.9 (*)    All other components within normal limits  TROPONIN I (HIGH SENSITIVITY) -  Abnormal; Notable for the following components:   Troponin I (High Sensitivity) 29 (*)    All other components within normal limits  TROPONIN I (HIGH SENSITIVITY) - Abnormal; Notable for the following components:   Troponin I (High Sensitivity) 28 (*)    All other components within normal limits    EKG EKG Interpretation  Date/Time:  "Sunday May 13 2021 19:16:11 EST Ventricular Rate:  77 PR Interval:  166 QRS Duration: 116 QT Interval:  434 QTC Calculation: 491 R Axis:   143 Text Interpretation: Normal sinus rhythm Left posterior fascicular block Inferior infarct , age undetermined Cannot rule out Anterior infarct , age undetermined Abnormal ECG flipped t waves laterally seen on prior Otherwise no significant change Confirmed by Floyd, Dan ([id number]) on 05/14/2021 9:25:03 AM  Radiology DG Chest 2 View  Result Date: 05/13/2021 CLINICAL DATA:  Chest pain, shortness of breath EXAM: CHEST - 2 VIEW COMPARISON:  03/20/2021 FINDINGS: Lungs are clear.  No pleural effusion or pneumothorax. The heart is normal in size. Prosthetic aortic valve. Postsurgical changes related to prior CABG. Thoracic aortic atherosclerosis. Median sternotomy. Very mild degenerative changes of the mid thoracic spine. IMPRESSION: Normal chest radiographs. Electronically Signed   By: Sriyesh  Krishnan M.D.   On: 05/13/2021 20:37    Procedures Procedures   Medications Ordered in ED Medications - No data to display  ED Course  I have reviewed the triage vital signs and the nursing  notes.  Pertinent labs & imaging results that were available during my care of the patient were reviewed by me and considered in my medical decision making (see chart for details).    MDM Rules/Calculators/A&P                          This is a 70 year old male with complex past medical history including recent CVA, T2DM, CAD, aortic valve replacement, and left IJ DVT (11/28/20) who presents to the ED today with a 2-3 month history of intermittent chest pain and shortness of breath. The pt is afebrile, BP elevated at 179/114 but otherwise HDS. Overall exam is unremarkable with the exception of 2+ pitting edema bilaterally. WBC 6, BNP 127, K 3.1, Cr 2.57 with GFR 26.  EKG without any evidence of ischemic changes. Troponin x2 flat. CXR unremarkable for any acute cardiopulmonary processes.   Differential includes ACS, PE, CHF for which workup in the ED has been unremarkable. The patient has also had extensive work-up at [facility], including LE duplex US, echo with bubble study, D-dimer, troponins, all of which have all been unremarkable. CT angio was considered at one point, however unable to be performed in the setting of GFR <30. I discussed with the patient our recommendation for further evaluation at [facility] with his cardiologist and PCP. Given patient's complaints of cough for the past few days, will give Rx for PRN tessalon. Pt was discharged in stable condition.   Final Clinical Impression(s) / ED Diagnoses Final diagnoses:  Dyspnea, unspecified type    Rx / DC Orders ED Discharge Orders          Ordered    benzonatate (TESSALON PERLES) 100 MG capsule  3 times daily PRN        12" /05/22 1048            Mitzie Na, M.D. [facility] Resident, PGY-1 Pager # [phone]     Orvis Brill, MD 05/14/21 [place], [place], DO 05/14/21  1211  

## 2021-05-14 NOTE — Discharge Instructions (Addendum)
You were seen in the ED for shortness of breath. Your lab results and imaging do not pinpoint a specific cause for this at this time (reassuring that you are not having any emergent causes such as a heart attack or a clot that we can see).  Because you endorsed a cough today, I am discharging you with Ladona Ridgel, which you can take 3 times a day as needed.  We recommend that you follow-up with your [facility] cardiologist for further work-up of your shortness of breath.  Please return to the ED for any significantly worsening shortness of breath, fevers, or significant chest pain.

## 2021-11-08 ENCOUNTER — Encounter: Payer: Self-pay | Admitting: Ophthalmology

## 2021-11-15 NOTE — Discharge Instructions (Signed)

## 2021-11-19 ENCOUNTER — Encounter: Payer: Self-pay | Admitting: Ophthalmology

## 2021-11-19 ENCOUNTER — Other Ambulatory Visit: Payer: Self-pay

## 2021-11-19 ENCOUNTER — Ambulatory Visit: Payer: Medicare Other | Admitting: Anesthesiology

## 2021-11-19 ENCOUNTER — Encounter
Admission: RE | Disposition: A | Discharge status: Home / Self Care | Payer: Self-pay | Source: Home / Self Care | Attending: Ophthalmology

## 2021-11-19 ENCOUNTER — Ambulatory Visit
Admission: RE | Admit: 2021-11-19 | Discharge: 2021-11-19 | Disposition: A | Discharge status: Home / Self Care | Payer: Medicare Other | Attending: Ophthalmology | Admitting: Ophthalmology

## 2021-11-19 DIAGNOSIS — Z951 Presence of aortocoronary bypass graft: Secondary | ICD-10-CM | POA: Insufficient documentation

## 2021-11-19 DIAGNOSIS — E119 Type 2 diabetes mellitus without complications: Secondary | ICD-10-CM

## 2021-11-19 DIAGNOSIS — Z8673 Personal history of transient ischemic attack (TIA), and cerebral infarction without residual deficits: Secondary | ICD-10-CM | POA: Diagnosis not present

## 2021-11-19 DIAGNOSIS — I2510 Atherosclerotic heart disease of native coronary artery without angina pectoris: Secondary | ICD-10-CM | POA: Diagnosis not present

## 2021-11-19 DIAGNOSIS — N189 Chronic kidney disease, unspecified: Secondary | ICD-10-CM | POA: Diagnosis not present

## 2021-11-19 DIAGNOSIS — I130 Hypertensive heart and chronic kidney disease with heart failure and stage 1 through stage 4 chronic kidney disease, or unspecified chronic kidney disease: Secondary | ICD-10-CM | POA: Insufficient documentation

## 2021-11-19 DIAGNOSIS — E1136 Type 2 diabetes mellitus with diabetic cataract: Secondary | ICD-10-CM | POA: Insufficient documentation

## 2021-11-19 DIAGNOSIS — I252 Old myocardial infarction: Secondary | ICD-10-CM | POA: Insufficient documentation

## 2021-11-19 DIAGNOSIS — E1122 Type 2 diabetes mellitus with diabetic chronic kidney disease: Secondary | ICD-10-CM | POA: Diagnosis not present

## 2021-11-19 DIAGNOSIS — I509 Heart failure, unspecified: Secondary | ICD-10-CM | POA: Insufficient documentation

## 2021-11-19 DIAGNOSIS — Z87891 Personal history of nicotine dependence: Secondary | ICD-10-CM | POA: Diagnosis not present

## 2021-11-19 HISTORY — DX: Chronic kidney disease, unspecified: N18.9

## 2021-11-19 HISTORY — DX: Benign prostatic hyperplasia without lower urinary tract symptoms: N40.0

## 2021-11-19 HISTORY — DX: Nonrheumatic aortic (valve) stenosis: I35.0

## 2021-11-19 HISTORY — DX: Dizziness and giddiness: R42

## 2021-11-19 HISTORY — PX: CATARACT EXTRACTION W/PHACO: SHX586

## 2021-11-19 HISTORY — DX: Presence of dental prosthetic device (complete) (partial): Z97.2

## 2021-11-19 HISTORY — DX: Unspecified osteoarthritis, unspecified site: M19.90

## 2021-11-19 LAB — GLUCOSE, CAPILLARY
Glucose-Capillary: 222 mg/dL — ABNORMAL HIGH (ref 70–99)
Glucose-Capillary: 214 mg/dL — ABNORMAL HIGH (ref 70–99)

## 2021-11-19 SURGERY — PHACOEMULSIFICATION, CATARACT, WITH IOL INSERTION
Anesthesia: Monitor Anesthesia Care | Site: Eye | Laterality: Right

## 2021-11-19 MED ORDER — MOXIFLOXACIN HCL 0.5 % OP SOLN
OPHTHALMIC | Status: DC | PRN
  Administered 2021-11-19: 0.2 mL via OPHTHALMIC

## 2021-11-19 MED ORDER — FENTANYL CITRATE (PF) 100 MCG/2ML IJ SOLN
INTRAMUSCULAR | Status: DC | PRN
Start: 2021-11-19 — End: 2021-11-19
  Administered 2021-11-19 (×2): 50 ug via INTRAVENOUS

## 2021-11-19 MED ORDER — BRIMONIDINE TARTRATE-TIMOLOL 0.2-0.5 % OP SOLN
OPHTHALMIC | Status: DC | PRN
  Administered 2021-11-19: 1 [drp] via OPHTHALMIC

## 2021-11-19 MED ORDER — SIGHTPATH DOSE#1 BSS IO SOLN
INTRAOCULAR | Status: DC | PRN
  Administered 2021-11-19: 15 mL

## 2021-11-19 MED ORDER — LACTATED RINGERS IV SOLN: INTRAVENOUS | Status: DC

## 2021-11-19 MED ORDER — ACETAMINOPHEN 325 MG PO TABS: 325.0000 mg | ORAL_TABLET | ORAL | Status: DC | PRN

## 2021-11-19 MED ORDER — SIGHTPATH DOSE#1 NA HYALUR & NA CHOND-NA HYALUR IO KIT
PACK | INTRAOCULAR | Status: DC | PRN
  Administered 2021-11-19: 1 via OPHTHALMIC

## 2021-11-19 MED ORDER — TETRACAINE HCL 0.5 % OP SOLN
1.0000 [drp] | OPHTHALMIC | Status: DC | PRN
  Administered 2021-11-19 (×3): 1 [drp] via OPHTHALMIC

## 2021-11-19 MED ORDER — ARMC OPHTHALMIC DILATING DROPS
1.0000 "application " | OPHTHALMIC | Status: DC | PRN
  Administered 2021-11-19 (×3): 1 via OPHTHALMIC

## 2021-11-19 MED ORDER — LIDOCAINE HCL (PF) 2 % IJ SOLN
INTRAOCULAR | Status: DC | PRN
  Administered 2021-11-19: 1 mL via INTRAOCULAR

## 2021-11-19 MED ORDER — ONDANSETRON HCL 4 MG/2ML IJ SOLN: 4.0000 mg | Freq: Once | INTRAMUSCULAR | Status: DC | PRN

## 2021-11-19 MED ORDER — ACETAMINOPHEN 160 MG/5ML PO SOLN: 325.0000 mg | ORAL | Status: DC | PRN

## 2021-11-19 MED ORDER — SIGHTPATH DOSE#1 BSS IO SOLN
INTRAOCULAR | Status: DC | PRN
  Administered 2021-11-19: 72 mL via OPHTHALMIC

## 2021-11-19 SURGICAL SUPPLY — 21 items
CANNULA ANT/CHMB 27G (MISCELLANEOUS) IMPLANT
CANNULA ANT/CHMB 27GA (MISCELLANEOUS) IMPLANT
CATARACT SUITE SIGHTPATH (MISCELLANEOUS) ×2 IMPLANT
DISSECTOR HYDRO NUCLEUS 50X22 (MISCELLANEOUS) ×3 IMPLANT
DRSG TEGADERM 2-3/8X2-3/4 SM (GAUZE/BANDAGES/DRESSINGS) ×3 IMPLANT
FEE CATARACT SUITE SIGHTPATH (MISCELLANEOUS) ×2 IMPLANT
GLOVE SURG GAMMEX PI TX LF 7.5 (GLOVE) ×3 IMPLANT
GLOVE SURG SYN 8.5  E (GLOVE) ×2
GLOVE SURG SYN 8.5 E (GLOVE) ×1 IMPLANT
GLOVE SURG SYN 8.5 PF PI (GLOVE) ×2 IMPLANT
LENS IOL TECNIS EYHANCE 18.5 (Intraocular Lens) ×1 IMPLANT
NDL FILTER BLUNT 18X1 1/2 (NEEDLE) ×2 IMPLANT
NEEDLE FILTER BLUNT 18X 1/2SAF (NEEDLE) ×1
NEEDLE FILTER BLUNT 18X1 1/2 (NEEDLE) ×1 IMPLANT
PACK VIT ANT 23G (MISCELLANEOUS) IMPLANT
RING MALYGIN (MISCELLANEOUS) IMPLANT
SUT ETHILON 10-0 CS-B-6CS-B-6 (SUTURE)
SUTURE EHLN 10-0 CS-B-6CS-B-6 (SUTURE) IMPLANT
SYR 3ML LL SCALE MARK (SYRINGE) ×3 IMPLANT
SYR 5ML LL (SYRINGE) ×3 IMPLANT
WATER STERILE IRR 250ML POUR (IV SOLUTION) ×3 IMPLANT

## 2021-11-19 NOTE — Op Note (Addendum)
OPERATIVE NOTE  Shane Holloway [id number] 11/19/2021   PREOPERATIVE DIAGNOSIS: Nuclear sclerotic cataract right eye. H25.11   POSTOPERATIVE DIAGNOSIS: Nuclear sclerotic cataract right eye. H25.11   PROCEDURE:  Phacoemusification with posterior chamber intraocular lens placement of the right eye  Ultrasound time: Procedure(s) with comments: CATARACT EXTRACTION PHACO AND INTRAOCULAR LENS PLACEMENT (IOC) RIGHT DIABETIC (Right) - 7.32 0:54.3  LENS:   Implant Name Type Inv. Item Serial No. Manufacturer Lot No. LRB No. Used Action  LENS IOL TECNIS EYHANCE 18.5 - [id number] Intraocular Lens LENS IOL TECNIS EYHANCE 18.5 [id number] [employer]  Right 1 Implanted      SURGEON:  Courtney Heys. Lazarus Salines, MD Assistant Surgeon: Loralie Champagne, MD   ANESTHESIA:  Topical with tetracaine drops, augmented with 1% preservative-free intracameral lidocaine.   COMPLICATIONS:  None.   DESCRIPTION OF PROCEDURE:  The patient was identified in the holding room and transported to the operating room and placed in the supine position under the operating microscope.  The right eye was identified as the operative eye, which was prepped and draped in the usual sterile ophthalmic fashion.   A 1 millimeter clear-corneal paracentesis was made superotemporally. Preservative-free 1% lidocaine mixed with 1:1,000 bisulfite-free aqueous solution of epinephrine was injected into the anterior chamber. The anterior chamber was then filled with Viscoat viscoelastic. A 2.4 millimeter keratome was used to make a clear-corneal incision inferotemporally. A curvilinear capsulorrhexis was made with a cystotome and capsulorrhexis forceps. Balanced salt solution was used to hydrodissect and hydrodelineate the nucleus. Phacoemulsification was then used to remove the lens nucleus and epinucleus. The remaining cortex was then removed using the irrigation and aspiration handpiece. Provisc was then placed into the capsular bag to distend it for  lens placement. A +18.50 D DIB00 intraocular lens was then injected into the capsular bag. The remaining viscoelastic was aspirated.   Wounds were hydrated with balanced salt solution.  The anterior chamber was inflated to a physiologic pressure with balanced salt solution.  No wound leaks were noted. Vigamox was injected intracamerally.  Timolol and Brimonidine drops were applied to the eye.  The patient was taken to the recovery room in stable condition without complications of anesthesia or surgery.  Dr. Edison Pace was present for the entirety of the procedure.  Benay Pillow 11/19/2021, 8:06 AM

## 2021-11-19 NOTE — Anesthesia Preprocedure Evaluation (Addendum)
Anesthesia Evaluation  Patient identified by MRN, date of birth, ID band Patient awake    Reviewed: Allergy & Precautions, NPO status   Airway Mallampati: II  TM Distance: >3 FB     Dental   Pulmonary former smoker,    Pulmonary exam normal        Cardiovascular hypertension, + CAD, + Past MI, + CABG and +CHF  + Valvular Problems/Murmurs (s/p AVR 2015)  Rhythm:Regular Rate:Normal  Nuc stress test 2022 - Probably normal myocardial perfusion study. However, ejection fraction may be slightly decreased from prior and the left ventricle appears more dilated; would correlate with echocardiography and/or MR if clinically indicated.  - There is a small in size, moderate fixed perfusion abnormality involving the apical segments. This is consistent with probable subtle scar versus artifact. There is apparent decreased uptake in the inferior wall, new from prior, but this is adjacent to bowel uptake and may be artifactual.  - Left ventricular systolic function is borderline normal. Post stress the ejection fraction is 54%. The left ventricle is dilated in size.     Neuro/Psych Dementia CVA    GI/Hepatic   Endo/Other  diabetesBMI > 30  Renal/GU Renal InsufficiencyRenal disease     Musculoskeletal  (+) Arthritis ,   Abdominal   Peds  Hematology   Anesthesia Other Findings   Reproductive/Obstetrics                           Anesthesia Physical Anesthesia Plan  ASA: 3  Anesthesia Plan: MAC   Post-op Pain Management: Minimal or no pain anticipated   Induction: Intravenous  PONV Risk Score and Plan: 1 and TIVA, Midazolam and Treatment may vary due to age or medical condition  Airway Management Planned: Natural Airway and Nasal Cannula  Additional Equipment:   Intra-op Plan:   Post-operative Plan:   Informed Consent: I have reviewed the patients History and Physical, chart, labs and discussed the  procedure including the risks, benefits and alternatives for the proposed anesthesia with the patient or authorized representative who has indicated his/her understanding and acceptance.     Consent reviewed with POA (POA is brother, Shanon Brow)  Plan Discussed with: CRNA  Anesthesia Plan Comments:        Anesthesia Quick Evaluation

## 2021-11-19 NOTE — Transfer of Care (Signed)
Immediate Anesthesia Transfer of Care Note  Patient: Shane Holloway  Procedure(s) Performed: CATARACT EXTRACTION PHACO AND INTRAOCULAR LENS PLACEMENT (IOC) RIGHT DIABETIC (Right: Eye)  Patient Location: PACU  Anesthesia Type: MAC  Level of Consciousness: awake, alert  and patient cooperative  Airway and Oxygen Therapy: Patient Spontanous Breathing and Patient connected to supplemental oxygen  Post-op Assessment: Post-op Vital signs reviewed, Patient's Cardiovascular Status Stable, Respiratory Function Stable, Patent Airway and No signs of Nausea or vomiting  Post-op Vital Signs: Reviewed and stable  Complications: No notable events documented.

## 2021-11-19 NOTE — Anesthesia Postprocedure Evaluation (Signed)
Anesthesia Post Note  Patient: Shane Holloway  Procedure(s) Performed: CATARACT EXTRACTION PHACO AND INTRAOCULAR LENS PLACEMENT (IOC) RIGHT DIABETIC (Right: Eye)     Patient location during evaluation: PACU Anesthesia Type: MAC Level of consciousness: awake Pain management: pain level controlled Vital Signs Assessment: post-procedure vital signs reviewed and stable Respiratory status: respiratory function stable Cardiovascular status: stable Postop Assessment: no apparent nausea or vomiting Anesthetic complications: no   No notable events documented.  Veda Canning

## 2021-11-19 NOTE — H&P (Signed)
[name]   Primary Care Physician:  Caryl Never, MD Ophthalmologist: Dr. Benay Pillow  Pre-Procedure History & Physical: HPI:  Shane Holloway is a 71 y.o. male here for cataract surgery.  (Dr. Lazarus Salines).   Past Medical History:  Diagnosis Date   Allergy    Aortic stenosis    Arthritis    hands   BPH (benign prostatic hyperplasia)    CHF (congestive heart failure) (HCC)    CKD (chronic kidney disease)    Diabetes mellitus    Dyspnea    Hyperlipidemia    Hypertension    Myocardial infarction ([place]) 06/10/2002   Stroke ([place]) 09/2020   Some memory deficits   Vertigo    Wears dentures    full upper    Past Surgical History:  Procedure Laterality Date   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  Redo of 2V CABG, Aortic Root repair and AVR   CORONARY ARTERY BYPASS GRAFT  2009   x4; stents to diagonal a mid right coronary lesion; drug eluting stent in the mid RCA as well as ostium of the diagonal with a cutting balloon angioplasty of the diagonal.   JOINT REPLACEMENT      Prior to Admission medications   Medication Sig Start Date End Date Taking? Authorizing Provider  amiodarone (PACERONE) 200 MG tablet Take 1 tablet (200 mg total) by mouth daily. 03/21/21  Yes Little Ishikawa, MD  amLODipine (NORVASC) 2.5 MG tablet Take 2.5 mg by mouth daily.   Yes [provider]  atorvastatin (LIPITOR) 80 MG tablet Take 80 mg by mouth daily. 12/12/20  Yes [provider]  benzonatate (TESSALON PERLES) 100 MG capsule Take 1 capsule (100 mg total) by mouth 3 (three) times daily as needed for cough. 05/14/21  Yes Orvis Brill, MD  ELIQUIS 5 MG TABS tablet Take 5 mg by mouth 2 (two) times daily. 03/09/21  Yes [provider]  finasteride (PROSCAR) 5 MG tablet Take 5 mg by mouth daily.   Yes [provider]  furosemide (LASIX) 20 MG tablet Take 1 tablet (20 mg total) by mouth daily. 09/15/11  11/19/21 Yes Robyn Haber, MD  glipiZIDE (GLUCOTROL) 5 MG tablet Take 10 mg by mouth daily.   Yes [provider]  JARDIANCE 10 MG TABS tablet Take 10 mg by mouth daily. 03/09/21  Yes [provider]  losartan (COZAAR) 25 MG tablet Take 50 mg by mouth daily.   Yes [provider]  metoprolol tartrate (LOPRESSOR) 50 MG tablet Take 1 tablet (50 mg total) by mouth 2 (two) times daily. 03/21/21  Yes Little Ishikawa, MD  oxyCODONE (OXY IR/ROXICODONE) 5 MG immediate release tablet Take 5 mg by mouth.   Yes [provider]  Tamsulosin HCl (FLOMAX) 0.4 MG CAPS TAKE ONE CAPSULE BY MOUTH EVERY DAY Patient taking differently: Take 0.4 mg by mouth daily. 12/27/11  Yes Harrison Mons, PA    Allergies as of 10/10/2021 - Review Complete 05/13/2021  Allergen Reaction Noted   Ramipril Other (See Comments) 10/11/2013   Sulfa antibiotics Hives 08/10/2011    Family History  Problem Relation Age of Onset   Heart disease Mother    Heart disease Father     Social History   Socioeconomic History   Marital status: Legally Separated    Spouse name: Not on file   Number of children: Not on file   Years of education: Not on file  Highest education level: Not on file  Occupational History   Occupation: Full time    Comment: Owns own business  Tobacco Use   Smoking status: Former    Types: Cigarettes    Quit date: 1995    Years since quitting: 28.4   Smokeless tobacco: Former    Quit date: 1995  Scientific laboratory technician Use: Never used  Substance and Sexual Activity   Alcohol use: No   Drug use: No   Sexual activity: Not on file  Other Topics Concern   Not on file  Social History Narrative   No regular exercise   Social Determinants of Radio broadcast assistant Strain: Not on file  Food Insecurity: Not on file  Transportation Needs: Not on file  Physical Activity: Not on file  Stress: Not on file  Social Connections: Not on file  Intimate Partner  Violence: Not on file    Review of Systems: See HPI, otherwise negative ROS  Physical Exam: BP (!) 145/103   Pulse 77   Temp 97.6 F (36.4 C) (Temporal)   Ht '6\' 1"'$  (1.854 m)   Wt 106.1 kg   SpO2 96%   BMI 30.87 kg/m  General:   Alert, cooperative in NAD Head:  Normocephalic and atraumatic. Respiratory:  Normal work of breathing. Cardiovascular:  RRR  Impression/Plan: Shane Holloway is here for cataract surgery. (Dr. Lazarus Salines).  Risks, benefits, limitations, and alternatives regarding cataract surgery have been reviewed with the patient.  Questions have been answered.  All parties agreeable.   Benay Pillow, MD  11/19/2021, 7:14 AM

## 2021-11-19 NOTE — Anesthesia Procedure Notes (Signed)
Procedure Name: MAC Date/Time: 11/19/2021 7:39 AM  Performed by: Jeannene Patella, CRNAPre-anesthesia Checklist: Patient identified, Emergency Drugs available, Suction available, Timeout performed and Patient being monitored Patient Re-evaluated:Patient Re-evaluated prior to induction Oxygen Delivery Method: Nasal cannula Placement Confirmation: positive ETCO2

## 2021-11-20 ENCOUNTER — Encounter: Payer: Self-pay | Admitting: Ophthalmology

## 2021-11-23 NOTE — Anesthesia Preprocedure Evaluation (Signed)
Anesthesia Evaluation    Airway        Dental  (+) Upper Dentures   Pulmonary former smoker,           Cardiovascular hypertension, + CAD, + Past MI (2004), + CABG (2009, 2015) and +CHF  + dysrhythmias Supra Ventricular Tachycardia + Valvular Problems/Murmurs (S/p AV replacement 2015) AS    HLD  PET Stress (05/2021): - Probably normal myocardial perfusion study. However, ejection fraction may be slightly decreased from prior and the left ventricle appears more dilated; would correlate with echocardiography and/or MR if clinically indicated.  - There is a small in size, moderate fixed perfusion abnormality involving the apical segments. This is consistent with probable subtle scar versus artifact. There is apparent decreased uptake in the inferior wall, new from prior, but this is adjacent to bowel uptake and may be artifactual.  - Left ventricular systolic function is borderline normal. Post stress the ejection fraction is 54%. The left ventricle is dilated in size.  - No significant/Significant coronary calcifications were noted on the attenuation CT    Neuro/Psych PSYCHIATRIC DISORDERS Dementia CVA (09/2020, on Eliquis)    GI/Hepatic   Endo/Other  diabetes  Renal/GU CRFRenal disease     Musculoskeletal  (+) Arthritis ,   Abdominal (+) + obese (BMI 31),   Peds  Hematology   Anesthesia Other Findings   Reproductive/Obstetrics                             Anesthesia Physical Anesthesia Plan  ASA: 3  Anesthesia Plan: MAC   Post-op Pain Management:    Induction: Intravenous  PONV Risk Score and Plan: 1 and TIVA, Midazolam and Treatment may vary due to age or medical condition  Airway Management Planned: Nasal Cannula  Additional Equipment:   Intra-op Plan:   Post-operative Plan:   Informed Consent: I have reviewed the patients History and Physical, chart, labs and discussed the  procedure including the risks, benefits and alternatives for the proposed anesthesia with the patient or authorized representative who has indicated his/her understanding and acceptance.       Plan Discussed with: CRNA and Anesthesiologist  Anesthesia Plan Comments:         Anesthesia Quick Evaluation

## 2021-12-03 ENCOUNTER — Encounter
Admission: RE | Disposition: A | Discharge status: Home / Self Care | Payer: Self-pay | Source: Home / Self Care | Attending: Ophthalmology

## 2021-12-03 ENCOUNTER — Ambulatory Visit: Payer: Medicare Other | Admitting: Anesthesiology

## 2021-12-03 ENCOUNTER — Ambulatory Visit
Admission: RE | Admit: 2021-12-03 | Discharge: 2021-12-03 | Disposition: A | Discharge status: Home / Self Care | Payer: Medicare Other | Attending: Ophthalmology | Admitting: Ophthalmology

## 2021-12-03 ENCOUNTER — Encounter: Payer: Self-pay | Admitting: Ophthalmology

## 2021-12-03 ENCOUNTER — Other Ambulatory Visit: Payer: Self-pay

## 2021-12-03 DIAGNOSIS — E1122 Type 2 diabetes mellitus with diabetic chronic kidney disease: Secondary | ICD-10-CM | POA: Diagnosis not present

## 2021-12-03 DIAGNOSIS — H2512 Age-related nuclear cataract, left eye: Secondary | ICD-10-CM | POA: Diagnosis present

## 2021-12-03 DIAGNOSIS — I2510 Atherosclerotic heart disease of native coronary artery without angina pectoris: Secondary | ICD-10-CM | POA: Diagnosis not present

## 2021-12-03 DIAGNOSIS — Z7901 Long term (current) use of anticoagulants: Secondary | ICD-10-CM | POA: Insufficient documentation

## 2021-12-03 DIAGNOSIS — N189 Chronic kidney disease, unspecified: Secondary | ICD-10-CM | POA: Insufficient documentation

## 2021-12-03 DIAGNOSIS — Z6830 Body mass index (BMI) 30.0-30.9, adult: Secondary | ICD-10-CM | POA: Diagnosis not present

## 2021-12-03 DIAGNOSIS — I130 Hypertensive heart and chronic kidney disease with heart failure and stage 1 through stage 4 chronic kidney disease, or unspecified chronic kidney disease: Secondary | ICD-10-CM | POA: Insufficient documentation

## 2021-12-03 DIAGNOSIS — I509 Heart failure, unspecified: Secondary | ICD-10-CM | POA: Insufficient documentation

## 2021-12-03 DIAGNOSIS — E669 Obesity, unspecified: Secondary | ICD-10-CM | POA: Insufficient documentation

## 2021-12-03 DIAGNOSIS — Z8673 Personal history of transient ischemic attack (TIA), and cerebral infarction without residual deficits: Secondary | ICD-10-CM | POA: Diagnosis not present

## 2021-12-03 DIAGNOSIS — E1136 Type 2 diabetes mellitus with diabetic cataract: Secondary | ICD-10-CM | POA: Diagnosis not present

## 2021-12-03 DIAGNOSIS — I252 Old myocardial infarction: Secondary | ICD-10-CM | POA: Insufficient documentation

## 2021-12-03 DIAGNOSIS — Z951 Presence of aortocoronary bypass graft: Secondary | ICD-10-CM | POA: Insufficient documentation

## 2021-12-03 DIAGNOSIS — Z87891 Personal history of nicotine dependence: Secondary | ICD-10-CM | POA: Diagnosis not present

## 2021-12-03 HISTORY — PX: CATARACT EXTRACTION W/PHACO: SHX586

## 2021-12-03 LAB — GLUCOSE, CAPILLARY
Glucose-Capillary: 315 mg/dL — ABNORMAL HIGH (ref 70–99)
Glucose-Capillary: 255 mg/dL — ABNORMAL HIGH (ref 70–99)

## 2021-12-03 SURGERY — PHACOEMULSIFICATION, CATARACT, WITH IOL INSERTION
Anesthesia: Monitor Anesthesia Care | Site: Eye | Laterality: Left

## 2021-12-03 MED ORDER — INSULIN LISPRO 100 UNIT/ML IJ SOLN
6.0000 [IU] | Freq: Once | INTRAMUSCULAR | Status: AC
  Administered 2021-12-03: 6 [IU] via SUBCUTANEOUS

## 2021-12-03 MED ORDER — MIDAZOLAM HCL 2 MG/2ML IJ SOLN
INTRAMUSCULAR | Status: DC | PRN
  Administered 2021-12-03: 1 mg via INTRAVENOUS

## 2021-12-03 MED ORDER — LIDOCAINE HCL (PF) 2 % IJ SOLN
INTRAOCULAR | Status: DC | PRN
  Administered 2021-12-03: 1 mL via INTRAOCULAR

## 2021-12-03 MED ORDER — SIGHTPATH DOSE#1 BSS IO SOLN: INTRAOCULAR | Status: DC | PRN

## 2021-12-03 MED ORDER — MOXIFLOXACIN HCL 0.5 % OP SOLN
OPHTHALMIC | Status: DC | PRN
  Administered 2021-12-03: 0.2 mL via OPHTHALMIC

## 2021-12-03 MED ORDER — SIGHTPATH DOSE#1 NA HYALUR & NA CHOND-NA HYALUR IO KIT
PACK | INTRAOCULAR | Status: DC | PRN
  Administered 2021-12-03: 1 via OPHTHALMIC

## 2021-12-03 MED ORDER — ARMC OPHTHALMIC DILATING DROPS: OPHTHALMIC | Status: DC | PRN

## 2021-12-03 MED ORDER — FENTANYL CITRATE (PF) 100 MCG/2ML IJ SOLN
INTRAMUSCULAR | Status: DC | PRN
  Administered 2021-12-03 (×2): 50 ug via INTRAVENOUS

## 2021-12-03 MED ORDER — SIGHTPATH DOSE#1 BSS IO SOLN
INTRAOCULAR | Status: DC | PRN
  Administered 2021-12-03: 15 mL

## 2021-12-03 MED ORDER — ONDANSETRON HCL 4 MG/2ML IJ SOLN
INTRAMUSCULAR | Status: DC | PRN
  Administered 2021-12-03: 4 mg via INTRAVENOUS

## 2021-12-03 MED ORDER — TETRACAINE HCL 0.5 % OP SOLN
1.0000 [drp] | OPHTHALMIC | Status: DC | PRN
Start: 2021-12-03 — End: 2021-12-03
  Administered 2021-12-03 (×3): 1 [drp] via OPHTHALMIC

## 2021-12-03 MED ORDER — PHENYLEPHRINE-KETOROLAC 1-0.3 % IO SOLN
INTRAOCULAR | Status: DC | PRN
  Administered 2021-12-03: 64 mL via OPHTHALMIC

## 2021-12-03 MED ORDER — LACTATED RINGERS IV SOLN: INTRAVENOUS | Status: DC

## 2021-12-03 MED ORDER — ACETAMINOPHEN 325 MG PO TABS: 650.0000 mg | ORAL_TABLET | ORAL | Status: DC | PRN

## 2021-12-03 MED ORDER — BRIMONIDINE TARTRATE-TIMOLOL 0.2-0.5 % OP SOLN
OPHTHALMIC | Status: DC | PRN
  Administered 2021-12-03: 1 [drp] via OPHTHALMIC

## 2021-12-03 MED ORDER — ACETAMINOPHEN 160 MG/5ML PO SOLN: 325.0000 mg | ORAL | Status: DC | PRN

## 2021-12-03 MED ORDER — ONDANSETRON HCL 4 MG/2ML IJ SOLN
4.0000 mg | Freq: Once | INTRAMUSCULAR | Status: DC | PRN
Start: 2021-12-03 — End: 2021-12-03

## 2021-12-03 SURGICAL SUPPLY — 15 items
CATARACT SUITE SIGHTPATH (MISCELLANEOUS) ×2 IMPLANT
DISSECTOR HYDRO NUCLEUS 50X22 (MISCELLANEOUS) ×3 IMPLANT
DRSG TEGADERM 2-3/8X2-3/4 SM (GAUZE/BANDAGES/DRESSINGS) ×3 IMPLANT
FEE CATARACT SUITE SIGHTPATH (MISCELLANEOUS) ×2 IMPLANT
GLOVE SURG GAMMEX PI TX LF 7.5 (GLOVE) ×3 IMPLANT
GLOVE SURG SYN 8.5  E (GLOVE) ×2
GLOVE SURG SYN 8.5 E (GLOVE) ×1 IMPLANT
GLOVE SURG SYN 8.5 PF PI (GLOVE) ×2 IMPLANT
LENS IOL TECNIS EYHANCE 18.5 (Intraocular Lens) ×1 IMPLANT
NDL FILTER BLUNT 18X1 1/2 (NEEDLE) ×2 IMPLANT
NEEDLE FILTER BLUNT 18X 1/2SAF (NEEDLE) ×1
NEEDLE FILTER BLUNT 18X1 1/2 (NEEDLE) ×1 IMPLANT
SYR 3ML LL SCALE MARK (SYRINGE) ×3 IMPLANT
SYR 5ML LL (SYRINGE) ×3 IMPLANT
WATER STERILE IRR 250ML POUR (IV SOLUTION) ×3 IMPLANT

## 2021-12-03 NOTE — Transfer of Care (Signed)
Immediate Anesthesia Transfer of Care Note  Patient: Shane Holloway  Procedure(s) Performed: CATARACT EXTRACTION PHACO AND INTRAOCULAR LENS PLACEMENT (IOC) LEFT DIABETIC (Left: Eye)  Patient Location: PACU  Anesthesia Type: MAC  Level of Consciousness: awake, alert  and patient cooperative  Airway and Oxygen Therapy: Patient Spontanous Breathing and Patient connected to supplemental oxygen  Post-op Assessment: Post-op Vital signs reviewed, Patient's Cardiovascular Status Stable, Respiratory Function Stable, Patent Airway and No signs of Nausea or vomiting  Post-op Vital Signs: Reviewed and stable  Complications: No notable events documented.

## 2021-12-03 NOTE — Anesthesia Postprocedure Evaluation (Signed)
Anesthesia Post Note  Patient: Shane Holloway  Procedure(s) Performed: CATARACT EXTRACTION PHACO AND INTRAOCULAR LENS PLACEMENT (IOC) LEFT DIABETIC (Left: Eye)     Patient location during evaluation: PACU Anesthesia Type: MAC Level of consciousness: awake and alert Pain management: pain level controlled Vital Signs Assessment: post-procedure vital signs reviewed and stable Respiratory status: spontaneous breathing, nonlabored ventilation, respiratory function stable and patient connected to nasal cannula oxygen Cardiovascular status: stable and blood pressure returned to baseline Postop Assessment: no apparent nausea or vomiting Anesthetic complications: no   No notable events documented.  [name] A  [name]

## 2021-12-04 ENCOUNTER — Encounter: Payer: Self-pay | Admitting: Ophthalmology

## 2021-12-10 ENCOUNTER — Other Ambulatory Visit: Payer: Self-pay

## 2021-12-10 ENCOUNTER — Inpatient Hospital Stay
Admission: EM | Admit: 2021-12-10 | Discharge: 2021-12-24 | DRG: 246 | Discharge status: Against Medical Advice | Payer: Medicare Other | Attending: Internal Medicine | Admitting: Internal Medicine

## 2021-12-10 ENCOUNTER — Emergency Department: Payer: Medicare Other

## 2021-12-10 DIAGNOSIS — I4510 Unspecified right bundle-branch block: Secondary | ICD-10-CM | POA: Diagnosis present

## 2021-12-10 DIAGNOSIS — E872 Acidosis, unspecified: Secondary | ICD-10-CM | POA: Diagnosis not present

## 2021-12-10 DIAGNOSIS — U071 COVID-19: Secondary | ICD-10-CM | POA: Diagnosis not present

## 2021-12-10 DIAGNOSIS — F0150 Vascular dementia without behavioral disturbance: Secondary | ICD-10-CM | POA: Diagnosis present

## 2021-12-10 DIAGNOSIS — I214 Non-ST elevation (NSTEMI) myocardial infarction: Secondary | ICD-10-CM | POA: Diagnosis present

## 2021-12-10 DIAGNOSIS — E876 Hypokalemia: Secondary | ICD-10-CM | POA: Diagnosis not present

## 2021-12-10 DIAGNOSIS — D696 Thrombocytopenia, unspecified: Secondary | ICD-10-CM | POA: Diagnosis not present

## 2021-12-10 DIAGNOSIS — E785 Hyperlipidemia, unspecified: Secondary | ICD-10-CM | POA: Diagnosis present

## 2021-12-10 DIAGNOSIS — I160 Hypertensive urgency: Secondary | ICD-10-CM | POA: Diagnosis present

## 2021-12-10 DIAGNOSIS — I5043 Acute on chronic combined systolic (congestive) and diastolic (congestive) heart failure: Secondary | ICD-10-CM | POA: Diagnosis present

## 2021-12-10 DIAGNOSIS — N184 Chronic kidney disease, stage 4 (severe): Secondary | ICD-10-CM | POA: Diagnosis present

## 2021-12-10 DIAGNOSIS — Z953 Presence of xenogenic heart valve: Secondary | ICD-10-CM | POA: Diagnosis not present

## 2021-12-10 DIAGNOSIS — E1165 Type 2 diabetes mellitus with hyperglycemia: Secondary | ICD-10-CM | POA: Diagnosis present

## 2021-12-10 DIAGNOSIS — F01511 Vascular dementia, unspecified severity, with agitation: Secondary | ICD-10-CM | POA: Diagnosis present

## 2021-12-10 DIAGNOSIS — Z882 Allergy status to sulfonamides status: Secondary | ICD-10-CM

## 2021-12-10 DIAGNOSIS — Z7984 Long term (current) use of oral hypoglycemic drugs: Secondary | ICD-10-CM

## 2021-12-10 DIAGNOSIS — E669 Obesity, unspecified: Secondary | ICD-10-CM | POA: Diagnosis present

## 2021-12-10 DIAGNOSIS — Z8249 Family history of ischemic heart disease and other diseases of the circulatory system: Secondary | ICD-10-CM

## 2021-12-10 DIAGNOSIS — N400 Enlarged prostate without lower urinary tract symptoms: Secondary | ICD-10-CM | POA: Diagnosis present

## 2021-12-10 DIAGNOSIS — I25110 Atherosclerotic heart disease of native coronary artery with unstable angina pectoris: Secondary | ICD-10-CM | POA: Diagnosis present

## 2021-12-10 DIAGNOSIS — Z86718 Personal history of other venous thrombosis and embolism: Secondary | ICD-10-CM

## 2021-12-10 DIAGNOSIS — N1832 Chronic kidney disease, stage 3b: Secondary | ICD-10-CM | POA: Diagnosis present

## 2021-12-10 DIAGNOSIS — N189 Chronic kidney disease, unspecified: Secondary | ICD-10-CM | POA: Diagnosis not present

## 2021-12-10 DIAGNOSIS — E119 Type 2 diabetes mellitus without complications: Secondary | ICD-10-CM | POA: Diagnosis not present

## 2021-12-10 DIAGNOSIS — Z955 Presence of coronary angioplasty implant and graft: Secondary | ICD-10-CM | POA: Diagnosis not present

## 2021-12-10 DIAGNOSIS — R4189 Other symptoms and signs involving cognitive functions and awareness: Secondary | ICD-10-CM | POA: Diagnosis present

## 2021-12-10 DIAGNOSIS — E1122 Type 2 diabetes mellitus with diabetic chronic kidney disease: Secondary | ICD-10-CM | POA: Diagnosis present

## 2021-12-10 DIAGNOSIS — Z6830 Body mass index (BMI) 30.0-30.9, adult: Secondary | ICD-10-CM | POA: Diagnosis not present

## 2021-12-10 DIAGNOSIS — I10 Essential (primary) hypertension: Secondary | ICD-10-CM | POA: Diagnosis not present

## 2021-12-10 DIAGNOSIS — N1411 Contrast-induced nephropathy: Secondary | ICD-10-CM | POA: Diagnosis not present

## 2021-12-10 DIAGNOSIS — N2581 Secondary hyperparathyroidism of renal origin: Secondary | ICD-10-CM | POA: Diagnosis present

## 2021-12-10 DIAGNOSIS — I5020 Unspecified systolic (congestive) heart failure: Secondary | ICD-10-CM | POA: Diagnosis not present

## 2021-12-10 DIAGNOSIS — I77819 Aortic ectasia, unspecified site: Secondary | ICD-10-CM | POA: Diagnosis present

## 2021-12-10 DIAGNOSIS — Z951 Presence of aortocoronary bypass graft: Secondary | ICD-10-CM

## 2021-12-10 DIAGNOSIS — I5042 Chronic combined systolic (congestive) and diastolic (congestive) heart failure: Secondary | ICD-10-CM | POA: Diagnosis not present

## 2021-12-10 DIAGNOSIS — D509 Iron deficiency anemia, unspecified: Secondary | ICD-10-CM | POA: Diagnosis present

## 2021-12-10 DIAGNOSIS — I25810 Atherosclerosis of coronary artery bypass graft(s) without angina pectoris: Secondary | ICD-10-CM | POA: Diagnosis not present

## 2021-12-10 DIAGNOSIS — I255 Ischemic cardiomyopathy: Secondary | ICD-10-CM | POA: Diagnosis present

## 2021-12-10 DIAGNOSIS — Z91013 Allergy to seafood: Secondary | ICD-10-CM

## 2021-12-10 DIAGNOSIS — N179 Acute kidney failure, unspecified: Secondary | ICD-10-CM | POA: Diagnosis not present

## 2021-12-10 DIAGNOSIS — N170 Acute kidney failure with tubular necrosis: Secondary | ICD-10-CM

## 2021-12-10 DIAGNOSIS — J9601 Acute respiratory failure with hypoxia: Secondary | ICD-10-CM

## 2021-12-10 DIAGNOSIS — I2510 Atherosclerotic heart disease of native coronary artery without angina pectoris: Secondary | ICD-10-CM | POA: Diagnosis not present

## 2021-12-10 DIAGNOSIS — D472 Monoclonal gammopathy: Secondary | ICD-10-CM | POA: Diagnosis present

## 2021-12-10 DIAGNOSIS — I471 Supraventricular tachycardia: Secondary | ICD-10-CM | POA: Diagnosis present

## 2021-12-10 DIAGNOSIS — Z888 Allergy status to other drugs, medicaments and biological substances status: Secondary | ICD-10-CM

## 2021-12-10 DIAGNOSIS — D631 Anemia in chronic kidney disease: Secondary | ICD-10-CM | POA: Diagnosis present

## 2021-12-10 DIAGNOSIS — E781 Pure hyperglyceridemia: Secondary | ICD-10-CM | POA: Diagnosis present

## 2021-12-10 DIAGNOSIS — M899 Disorder of bone, unspecified: Secondary | ICD-10-CM | POA: Diagnosis present

## 2021-12-10 DIAGNOSIS — Z8673 Personal history of transient ischemic attack (TIA), and cerebral infarction without residual deficits: Secondary | ICD-10-CM

## 2021-12-10 DIAGNOSIS — Z79899 Other long term (current) drug therapy: Secondary | ICD-10-CM

## 2021-12-10 DIAGNOSIS — I252 Old myocardial infarction: Secondary | ICD-10-CM

## 2021-12-10 DIAGNOSIS — T508X5A Adverse effect of diagnostic agents, initial encounter: Secondary | ICD-10-CM | POA: Diagnosis not present

## 2021-12-10 DIAGNOSIS — D519 Vitamin B12 deficiency anemia, unspecified: Secondary | ICD-10-CM

## 2021-12-10 DIAGNOSIS — Z87891 Personal history of nicotine dependence: Secondary | ICD-10-CM

## 2021-12-10 DIAGNOSIS — I130 Hypertensive heart and chronic kidney disease with heart failure and stage 1 through stage 4 chronic kidney disease, or unspecified chronic kidney disease: Secondary | ICD-10-CM | POA: Diagnosis present

## 2021-12-10 DIAGNOSIS — Z7901 Long term (current) use of anticoagulants: Secondary | ICD-10-CM

## 2021-12-10 HISTORY — DX: Hypertensive urgency: I16.0

## 2021-12-10 HISTORY — DX: Non-ST elevation (NSTEMI) myocardial infarction: I21.4

## 2021-12-10 LAB — BASIC METABOLIC PANEL
Anion gap: 8 (ref 5–15)
BUN: 28 mg/dL — ABNORMAL HIGH (ref 8–23)
CO2: 27 mmol/L (ref 22–32)
Calcium: 9.2 mg/dL (ref 8.9–10.3)
Chloride: 106 mmol/L (ref 98–111)
Creatinine, Ser: 2.73 mg/dL — ABNORMAL HIGH (ref 0.61–1.24)
GFR, Estimated: 24 mL/min — ABNORMAL LOW (ref >60)
Glucose, Bld: 264 mg/dL — ABNORMAL HIGH (ref 70–99)
Potassium: 3.6 mmol/L (ref 3.5–5.1)
Sodium: 141 mmol/L (ref 135–145)

## 2021-12-10 LAB — CBC
HCT: 46.9 % (ref 39.0–52.0)
Hemoglobin: 15.9 g/dL (ref 13.0–17.0)
MCH: 29.1 pg (ref 26.0–34.0)
MCHC: 33.9 g/dL (ref 30.0–36.0)
MCV: 85.9 fL (ref 80.0–100.0)
Platelets: 129 10*3/uL — ABNORMAL LOW (ref 150–400)
RBC: 5.46 MIL/uL (ref 4.22–5.81)
RDW: 13.6 % (ref 11.5–15.5)
WBC: 7.6 10*3/uL (ref 4.0–10.5)
nRBC: 0 % (ref 0.0–0.2)

## 2021-12-10 LAB — TROPONIN I (HIGH SENSITIVITY)
Troponin I (High Sensitivity): 51 ng/L — ABNORMAL HIGH (ref <18)
Troponin I (High Sensitivity): 919 ng/L (ref <18)

## 2021-12-10 LAB — PROTIME-INR
INR: 1.1 (ref 0.8–1.2)
Prothrombin Time: 13.6 seconds (ref 11.4–15.2)

## 2021-12-10 LAB — APTT: aPTT: 29 seconds (ref 24–36)

## 2021-12-10 LAB — HEPARIN LEVEL (UNFRACTIONATED): Heparin Unfractionated: 0.33 IU/mL (ref 0.30–0.70)

## 2021-12-10 MED ORDER — CYCLOBENZAPRINE HCL 10 MG PO TABS
5.0000 mg | ORAL_TABLET | Freq: Two times a day (BID) | ORAL | Status: DC
  Administered 2021-12-10 – 2021-12-24 (×27): 5 mg via ORAL
  Filled 2021-12-10 (×27): qty 1

## 2021-12-10 MED ORDER — HYDRALAZINE HCL 20 MG/ML IJ SOLN
10.0000 mg | Freq: Four times a day (QID) | INTRAMUSCULAR | Status: DC | PRN
  Administered 2021-12-11: 10 mg via INTRAVENOUS
  Filled 2021-12-10: qty 1

## 2021-12-10 MED ORDER — ACETAMINOPHEN 325 MG PO TABS
650.0000 mg | ORAL_TABLET | ORAL | Status: DC | PRN
  Administered 2021-12-22: 650 mg via ORAL
  Filled 2021-12-10: qty 2

## 2021-12-10 MED ORDER — SODIUM CHLORIDE 0.9 % IV SOLN: INTRAVENOUS | Status: DC

## 2021-12-10 MED ORDER — AMIODARONE HCL 200 MG PO TABS
200.0000 mg | ORAL_TABLET | Freq: Every day | ORAL | Status: DC
  Administered 2021-12-11 – 2021-12-24 (×14): 200 mg via ORAL
  Filled 2021-12-10 (×14): qty 1

## 2021-12-10 MED ORDER — ASPIRIN 300 MG RE SUPP: 300.0000 mg | RECTAL | Status: AC

## 2021-12-10 MED ORDER — NITROGLYCERIN 0.4 MG SL SUBL
0.4000 mg | SUBLINGUAL_TABLET | SUBLINGUAL | Status: DC | PRN
  Filled 2021-12-10: qty 1

## 2021-12-10 MED ORDER — ATORVASTATIN CALCIUM 80 MG PO TABS
80.0000 mg | ORAL_TABLET | Freq: Every day | ORAL | Status: DC
  Administered 2021-12-11 – 2021-12-24 (×14): 80 mg via ORAL
  Filled 2021-12-10: qty 4
  Filled 2021-12-10 (×7): qty 1
  Filled 2021-12-10: qty 4
  Filled 2021-12-10 (×5): qty 1

## 2021-12-10 MED ORDER — NITROGLYCERIN IN D5W 200-5 MCG/ML-% IV SOLN
0.0000 ug/min | INTRAVENOUS | Status: DC
  Administered 2021-12-10: 5 ug/min via INTRAVENOUS
  Administered 2021-12-11: 35 ug/min via INTRAVENOUS
  Administered 2021-12-12: 50 ug/min via INTRAVENOUS
  Filled 2021-12-10 (×3): qty 250

## 2021-12-10 MED ORDER — HEPARIN (PORCINE) 25000 UT/250ML-% IV SOLN
1400.0000 [IU]/h | INTRAVENOUS | Status: DC
  Administered 2021-12-10 – 2021-12-11 (×2): 1400 [IU]/h via INTRAVENOUS
  Filled 2021-12-10 (×3): qty 250

## 2021-12-10 MED ORDER — HEPARIN BOLUS VIA INFUSION
4000.0000 [IU] | Freq: Once | INTRAVENOUS | Status: AC
  Administered 2021-12-10: 4000 [IU] via INTRAVENOUS
  Filled 2021-12-10: qty 4000

## 2021-12-10 MED ORDER — VITAMIN D 25 MCG (1000 UNIT) PO TABS
5000.0000 [IU] | ORAL_TABLET | Freq: Every day | ORAL | Status: DC
Start: 2021-12-11 — End: 2021-12-24
  Administered 2021-12-11 – 2021-12-24 (×14): 5000 [IU] via ORAL
  Filled 2021-12-10 (×14): qty 5

## 2021-12-10 MED ORDER — FUROSEMIDE 40 MG PO TABS
20.0000 mg | ORAL_TABLET | Freq: Every day | ORAL | Status: DC
Start: 2021-12-11 — End: 2021-12-11
  Administered 2021-12-11: 20 mg via ORAL
  Filled 2021-12-10: qty 1

## 2021-12-10 MED ORDER — FINASTERIDE 5 MG PO TABS
5.0000 mg | ORAL_TABLET | Freq: Every day | ORAL | Status: DC
  Administered 2021-12-11 – 2021-12-24 (×14): 5 mg via ORAL
  Filled 2021-12-10 (×14): qty 1

## 2021-12-10 MED ORDER — BENZONATATE 100 MG PO CAPS
100.0000 mg | ORAL_CAPSULE | Freq: Three times a day (TID) | ORAL | Status: DC | PRN
Start: 2021-12-10 — End: 2021-12-24
  Administered 2021-12-15 – 2021-12-22 (×5): 100 mg via ORAL
  Filled 2021-12-10 (×5): qty 1

## 2021-12-10 MED ORDER — ASPIRIN 81 MG PO TBEC
81.0000 mg | DELAYED_RELEASE_TABLET | Freq: Every day | ORAL | Status: DC
  Administered 2021-12-11 – 2021-12-17 (×6): 81 mg via ORAL
  Filled 2021-12-10 (×7): qty 1

## 2021-12-10 MED ORDER — AMLODIPINE BESYLATE 5 MG PO TABS
2.5000 mg | ORAL_TABLET | Freq: Every day | ORAL | Status: DC
Start: 2021-12-11 — End: 2021-12-11
  Administered 2021-12-11: 2.5 mg via ORAL
  Filled 2021-12-10: qty 1

## 2021-12-10 MED ORDER — MORPHINE SULFATE (PF) 2 MG/ML IV SOLN
2.0000 mg | INTRAVENOUS | Status: DC | PRN
  Administered 2021-12-11: 2 mg via INTRAVENOUS
  Filled 2021-12-10: qty 1

## 2021-12-10 MED ORDER — ASPIRIN 81 MG PO CHEW
324.0000 mg | CHEWABLE_TABLET | ORAL | Status: AC
  Administered 2021-12-10: 324 mg via ORAL
  Filled 2021-12-10: qty 4

## 2021-12-10 MED ORDER — METOPROLOL TARTRATE 25 MG PO TABS
25.0000 mg | ORAL_TABLET | Freq: Two times a day (BID) | ORAL | Status: DC
  Filled 2021-12-10: qty 1

## 2021-12-10 MED ORDER — METOPROLOL TARTRATE 50 MG PO TABS
50.0000 mg | ORAL_TABLET | Freq: Two times a day (BID) | ORAL | Status: DC
Start: 2021-12-10 — End: 2021-12-12
  Administered 2021-12-10 – 2021-12-11 (×3): 50 mg via ORAL
  Filled 2021-12-10 (×3): qty 1

## 2021-12-10 MED ORDER — GLIPIZIDE ER 10 MG PO TB24
10.0000 mg | ORAL_TABLET | Freq: Every day | ORAL | Status: DC
Start: 2021-12-11 — End: 2021-12-15
  Administered 2021-12-11 – 2021-12-15 (×5): 10 mg via ORAL
  Filled 2021-12-10 (×5): qty 1

## 2021-12-10 MED ORDER — EMPAGLIFLOZIN 10 MG PO TABS
10.0000 mg | ORAL_TABLET | Freq: Every day | ORAL | Status: DC
Start: 2021-12-11 — End: 2021-12-15
  Administered 2021-12-11 – 2021-12-15 (×5): 10 mg via ORAL
  Filled 2021-12-10 (×5): qty 1

## 2021-12-10 MED ORDER — TAMSULOSIN HCL 0.4 MG PO CAPS
0.4000 mg | ORAL_CAPSULE | Freq: Every day | ORAL | Status: DC
  Administered 2021-12-11 – 2021-12-24 (×14): 0.4 mg via ORAL
  Filled 2021-12-10 (×14): qty 1

## 2021-12-10 MED ORDER — ATORVASTATIN CALCIUM 20 MG PO TABS: 80.0000 mg | ORAL_TABLET | Freq: Every day | ORAL | Status: DC

## 2021-12-10 MED ORDER — LOSARTAN POTASSIUM 50 MG PO TABS
100.0000 mg | ORAL_TABLET | Freq: Every day | ORAL | Status: DC
  Administered 2021-12-11: 100 mg via ORAL
  Filled 2021-12-10: qty 2

## 2021-12-10 MED ORDER — ONDANSETRON HCL 4 MG/2ML IJ SOLN: 4.0000 mg | Freq: Four times a day (QID) | INTRAMUSCULAR | Status: DC | PRN

## 2021-12-10 NOTE — ED Provider Notes (Signed)
[facility] Provider Note    Event Date/Time   First MD Initiated Contact with Patient 12/10/21 2125     (approximate)  History   Chief Complaint: Chest Pain  HPI  Shane Holloway is a 71 y.o. male with a past medical history of diabetes, CKD, CHF, prior MI status post CABG approximately 10 years ago per patient who presents to the emergency department for chest pain.  According to the patient approximately an hour prior to arrival he developed pain in the center of his chest.  Patient received 2 nitro tablets by EMS and states reduction of the pain down to a 3/10 currently.  Denies any shortness of breath or nausea.  No diaphoresis.  Patient states he does not normally get chest pain.  Physical Exam   Triage Vital Signs: ED Triage Vitals  Enc Vitals Group     BP 12/10/21 1757 140/79     Pulse Rate 12/10/21 1757 78     Resp 12/10/21 1757 20     Temp 12/10/21 1757 97.8 F (36.6 C)     Temp Source 12/10/21 1757 Oral     SpO2 12/10/21 1757 98 %     Weight 12/10/21 1757 233 lb (105.7 kg)     Height 12/10/21 1757 6' 1.5" (1.867 m)     Head Circumference --      Peak Flow --      Pain Score 12/10/21 1756 6     Pain Loc --      Pain Edu? --      Excl. in [place]? --     Most recent vital signs: Vitals:   12/10/21 2055 12/10/21 2100  BP: (!) 171/124 (!) 170/78  Pulse: 75 72  Resp: 15 16  Temp:    SpO2: 94% 94%    General: Awake, no distress.  CV:  Good peripheral perfusion.  Regular rate and rhythm  Resp:  Normal effort.  Equal breath sounds bilaterally.  Abd:  No distention.  Soft, nontender.  No rebound or guarding. Other:  No lower extremity edema or tenderness.   ED Results / Procedures / Treatments   EKG  EKG viewed and interpreted by myself shows a sinus rhythm 86 bpm with a narrow QRS, left axis deviation, largely normal intervals with nonspecific ST changes.  RADIOLOGY  I have viewed and interpreted the chest x-ray images I do not  see any significant abnormality on my interpretation. Radiology is read the chest x-ray is negative for acute process.   MEDICATIONS ORDERED IN ED: Medications - No data to display   IMPRESSION / MDM / [place] / ED COURSE  I reviewed the triage vital signs and the nursing notes.  Patient's presentation is most consistent with acute presentation with potential threat to life or bodily function.  Patient presents to the emergency department for chest pain starting around 4 PM tonight.  Patient states initially chest pain was fairly significant after 2 nitroglycerin tablets down to a 6/10 upon arrival down to a 3/10 during my evaluation.  Patient's initial troponin has resulted at 50.  Patient CBC is normal.  Chemistry shows chronic renal insufficiency largely unchanged from baseline.  Patient's chest x-ray shows no concerning findings, EKG shows nonspecific but no concerning findings.  Patient's repeat troponin however has elevated greater than 900 indicating NSTEMI.  I spoke to Dr. Quentin Ore of cardiology who recommends starting the patient on heparin and nitroglycerin infusion and admitting to the hospital service.  They will see the patient in the morning to decide upon catheterization.  I updated the patient on this plan of care he is agreeable.  CRITICAL CARE Performed by: Harvest Dark   Total critical care time: 30 minutes  Critical care time was exclusive of separately billable procedures and treating other patients.  Critical care was necessary to treat or prevent imminent or life-threatening deterioration.  Critical care was time spent personally by me on the following activities: development of treatment plan with patient and/or surrogate as well as nursing, discussions with consultants, evaluation of patient's response to treatment, examination of patient, obtaining history from patient or surrogate, ordering and performing treatments and interventions, ordering and  review of laboratory studies, ordering and review of radiographic studies, pulse oximetry and re-evaluation of patient's condition.   FINAL CLINICAL IMPRESSION(S) / ED DIAGNOSES   NSTEMI    Note:  This document was prepared using Dragon voice recognition software and may include unintentional dictation errors.   Harvest Dark, MD 12/10/21 2203

## 2021-12-10 NOTE — Assessment & Plan Note (Addendum)
IV heparin and IV nitroglycerin plus high-dose statin therapy plus aspirin.    Appreciate cardiology intervention.  Status post cardiac catheterization noting multivessel disease including diseased to graft and then interventional catheterization with PCI and drug-eluting stent placement to SVG to diagonal.  Continue Plavix, Lipitor and Coreg.  Stop aspirin 7/8 given that patient already on Eliquis.

## 2021-12-10 NOTE — Assessment & Plan Note (Addendum)
-   We will continue his Cozaar and place him on as needed IV hydralazine. - He is getting IV nitroglycerin drip.  Started on beta-blocker and now Imdur.  Blood pressures improved.

## 2021-12-10 NOTE — H&P (Signed)
[place]   PATIENT NAME: Shane Holloway    MR#:  [id number]  DATE OF BIRTH:  1950-11-05  DATE OF ADMISSION:  12/10/2021  PRIMARY CARE PHYSICIAN: Caryl Never, MD   Patient is coming from: Home  REQUESTING/REFERRING PHYSICIAN: Harvest Dark, MD  CHIEF COMPLAINT:   Chief Complaint  Patient presents with   Chest Pain    HISTORY OF PRESENT ILLNESS:  KISHAWN PICKAR is a 71 y.o. Caucasian male with medical history significant for BPH, CHF, CKD, type II smalls, hypertension comes lipidemia, coronary artery disease s/p CABG, s/p AVR with bioprosthetic aortic valve, on Eliquis and CVA, who presented to the ER with acute onset of midsternal chest pain that is 8-9/10 in severity and described as pressure with no nausea or vomiting or diaphoresis.  He denies any associated dyspnea or palpitations.  This pain happen at rest and its been worsening with exertion.  He stopped taking his Eliquis 3 days ago as he was expected to have tooth extraction.  He denies any cough or wheezing or hemoptysis.  He has been having bilateral leg pain without swelling.  No recent surgeries or lengthy travels.   ED Course: When he came to the ER, BP was 171/124 and later 170/78 with otherwise normal vital signs.  Labs revealed a blood glucose of 264, BUN of 28 and creatinine 2.73 compared to 32 and 2.57 in 12/22.  High sensitive troponin was 51 and later 919.  CBC showed thrombocytopenia of 129.  Coagulation profile was within normal.  EKG as reviewed by me : EKG showed sinus rhythm with a rate of 86 with frequent PVCs, left axis deviation, incomplete right bundle branch block and lateral T wave inversion. Imaging: Two-view chest x-ray showed aortic atherosclerosis with no acute cardiopulmonary disease.  The patient was given 4 baby aspirin as well as started on IV nitroglycerin drip and IV heparin bolus and drip.Marland Kitchen  He will be admitted to progressive unit bed for further evaluation and  management. PAST MEDICAL HISTORY:   Past Medical History:  Diagnosis Date   Allergy    Aortic stenosis    Arthritis    hands   BPH (benign prostatic hyperplasia)    CHF (congestive heart failure) (HCC)    CKD (chronic kidney disease)    Diabetes mellitus    Dyspnea    Hyperlipidemia    Hypertension    Myocardial infarction ([place]) 06/10/2002   Stroke ([place]) 09/2020   Some memory deficits   Vertigo    Wears dentures    full upper    PAST SURGICAL HISTORY:   Past Surgical History:  Procedure Laterality Date   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  Redo of 2V CABG, Aortic Root repair and AVR   CATARACT EXTRACTION W/PHACO Right 11/19/2021   Procedure: CATARACT EXTRACTION PHACO AND INTRAOCULAR LENS PLACEMENT ([place]) RIGHT DIABETIC;  Surgeon: Eulogio Bear, MD;  Location: [place];  Service: Ophthalmology;  Laterality: Right;  7.32 0:54.3   CATARACT EXTRACTION W/PHACO Left 12/03/2021   Procedure: CATARACT EXTRACTION PHACO AND INTRAOCULAR LENS PLACEMENT ([place]) LEFT DIABETIC;  Surgeon: Norvel Richards, MD;  Location: [facility];  Service: Ophthalmology;  Laterality: Left;  LEAVE FIRST CASE Diabetic 3.74 00:36.3   CORONARY ARTERY BYPASS GRAFT  2009   x4; stents to diagonal a mid right coronary lesion; drug eluting stent in the mid RCA as well as ostium of the diagonal with  a cutting balloon angioplasty of the diagonal.   JOINT REPLACEMENT      SOCIAL HISTORY:   Social History   Tobacco Use   Smoking status: Former    Types: Cigarettes    Quit date: 1995    Years since quitting: 28.5   Smokeless tobacco: Former    Quit date: 1995  Substance Use Topics   Alcohol use: No    FAMILY HISTORY:   Family History  Problem Relation Age of Onset   Heart disease Mother    Heart disease Father     DRUG ALLERGIES:   Allergies  Allergen Reactions   Ramipril Other (See Comments)    Cough with Altace    Shellfish Allergy Nausea And Vomiting   Sulfa Antibiotics Hives    REVIEW OF SYSTEMS:   ROS As per history of present illness. All pertinent systems were reviewed above. Constitutional, HEENT, cardiovascular, respiratory, GI, GU, musculoskeletal, neuro, psychiatric, endocrine, integumentary and hematologic systems were reviewed and are otherwise negative/unremarkable except for positive findings mentioned above in the HPI.   MEDICATIONS AT HOME:   Prior to Admission medications   Medication Sig Start Date End Date Taking? Authorizing Provider  furosemide (LASIX) 20 MG tablet Take 1 tablet (20 mg total) by mouth daily. 09/15/11 12/10/21 Yes Robyn Haber, MD  amiodarone (PACERONE) 200 MG tablet Take 1 tablet (200 mg total) by mouth daily. 03/21/21   Little Ishikawa, MD  amLODipine (NORVASC) 2.5 MG tablet Take 2.5 mg by mouth daily.    [provider]  atorvastatin (LIPITOR) 80 MG tablet Take 80 mg by mouth daily. 12/12/20   [provider]  benzonatate (TESSALON PERLES) 100 MG capsule Take 1 capsule (100 mg total) by mouth 3 (three) times daily as needed for cough. 05/14/21   Orvis Brill, MD  Cholecalciferol 125 MCG (5000 UT) TABS Take 1 tablet by mouth daily.    [provider]  cyclobenzaprine (FLEXERIL) 5 MG tablet Take 5 mg by mouth 2 (two) times daily. 12/10/21   [provider]  ELIQUIS 5 MG TABS tablet Take 5 mg by mouth 2 (two) times daily. 03/09/21   [provider]  finasteride (PROSCAR) 5 MG tablet Take 5 mg by mouth daily.    [provider]  glipiZIDE (GLUCOTROL XL) 10 MG 24 hr tablet Take 10 mg by mouth daily. 11/14/21   [provider]  JARDIANCE 10 MG TABS tablet Take 10 mg by mouth daily. 03/09/21   [provider]  losartan (COZAAR) 100 MG tablet Take 100 mg by mouth daily. 11/29/21   [provider]  metoprolol tartrate (LOPRESSOR) 50 MG tablet Take 1 tablet (50 mg total) by mouth 2 (two)  times daily. 03/21/21   Little Ishikawa, MD  Tamsulosin HCl (FLOMAX) 0.4 MG CAPS TAKE ONE CAPSULE BY MOUTH EVERY DAY Patient taking differently: Take 0.4 mg by mouth daily. 12/27/11   Harrison Mons, PA      VITAL SIGNS:  Blood pressure (!) 161/117, pulse 77, temperature 97.8 F (36.6 C), temperature source Oral, resp. rate 19, height 6' 1.5" (1.867 m), weight 105.7 kg, SpO2 96 %.  PHYSICAL EXAMINATION:  Physical Exam  GENERAL:  71 y.o.-year-old Caucasian male patient lying in the bed with no acute distress.  EYES: Pupils equal, round, reactive to light and accommodation. No scleral icterus. Extraocular muscles intact.  HEENT: Head atraumatic, normocephalic. Oropharynx and nasopharynx clear.  NECK:  Supple, no jugular venous distention. No thyroid enlargement, no  tenderness.  LUNGS: Normal breath sounds bilaterally, no wheezing, rales,rhonchi or crepitation. No use of accessory muscles of respiration.  CARDIOVASCULAR: Regular rate and rhythm, S1, S2 normal. No murmurs, rubs, or gallops.  ABDOMEN: Soft, nondistended, nontender. Bowel sounds present. No organomegaly or mass.  EXTREMITIES: No pedal edema, cyanosis, or clubbing.  NEUROLOGIC: Cranial nerves II through XII are intact. Muscle strength 5/5 in all extremities. Sensation intact. Gait not checked.  PSYCHIATRIC: The patient is alert and oriented x 3.  Normal affect and good eye contact. SKIN: No obvious rash, lesion, or ulcer.   LABORATORY PANEL:   CBC Recent Labs  Lab 12/10/21 1754  WBC 7.6  HGB 15.9  HCT 46.9  PLT 129*   ------------------------------------------------------------------------------------------------------------------  Chemistries  Recent Labs  Lab 12/10/21 1754  NA 141  K 3.6  CL 106  CO2 27  GLUCOSE 264*  BUN 28*  CREATININE 2.73*  CALCIUM 9.2   ------------------------------------------------------------------------------------------------------------------  Cardiac Enzymes No  results for input(s): "TROPONINI" in the last 168 hours. ------------------------------------------------------------------------------------------------------------------  RADIOLOGY:  DG Chest 2 View  Result Date: 12/10/2021 CLINICAL DATA:  cp EXAM: CHEST - 2 VIEW COMPARISON:  Chest x-ray 05/13/2021 FINDINGS: The heart and mediastinal contours are unchanged. Aortic calcification. No focal consolidation. No pulmonary edema. No pleural effusion. No pneumothorax. No acute osseous abnormality.  Intact sternotomy wires. IMPRESSION: 1. No active cardiopulmonary disease. 2.  Aortic Atherosclerosis (ICD10-I70.0). Electronically Signed   By: Iven Finn M.D.   On: 12/10/2021 18:25      IMPRESSION AND PLAN:  Assessment and Plan: * NSTEMI (non-ST elevated myocardial infarction) [facility]) - The patient will be admitted to a progressive unit bed. - We will continue him on IV heparin drip as well as IV nitroglycerin drip. - He will be placed on aspirin as well as high-dose statin therapy. - We will continue beta-blocker therapy with Lopressor. - We will keep him n.p.o. after midnight. - 2D echo and cardiology consult will be obtained. - Dr. Quentin Ore was notified and is aware about the patient.  Hypertensive urgency - We will continue his Cozaar and place him on as needed IV hydralazine. - He is getting IV nitroglycerin drip. - This could be worsening his ischemic demand.  Chronic kidney disease, stage 3b ([place]) - She will be hydrated with IV normal saline and will follow BMP. - His renal functions are currently stable.  S/P aortic valve replacement with bioprosthetic valve - We will hold off Eliquis and place him on IV heparin as mentioned above.  Benign prostatic hyperplasia - We will continue Flomax.  Type 2 diabetes mellitus without complication ([place]) - The patient will be placed on supplement coverage with NovoLog. - We will continue Glucotrol XL and Jardiance.   DVT prophylaxis: IV  heparin.   Advanced Care Planning:  Code Status: full code. Family Communication:  The plan of care was discussed in details with the patient (and family). I answered all questions. The patient agreed to proceed with the above mentioned plan. Further management will depend upon hospital course. Disposition Plan: Back to previous home environment Consults called: Cardiology. All the records are reviewed and case discussed with ED provider.  Status is: Inpatient  At the time of the admission, it appears that the appropriate admission status for this patient is inpatient.  This is judged to be reasonable and necessary in order to provide the required intensity of service to ensure the patient's safety given the presenting symptoms, physical exam findings and initial radiographic and laboratory  data in the context of comorbid conditions.  The patient requires inpatient status due to high intensity of service, high risk of further deterioration and high frequency of surveillance required.  I certify that at the time of admission, it is my clinical judgment that the patient will require inpatient hospital care extending more than 2 midnights.                            Dispo: The patient is from: Home              Anticipated d/c is to: Home              Patient currently is not medically stable to d/c.              Difficult to place patient: No  Christel Mormon M.D on 12/10/2021 at 10:54 PM  [facility]   From 7 PM-7 AM, contact night-coverage [email or web address]  CC: Primary care physician; Caryl Never, MD

## 2021-12-10 NOTE — Assessment & Plan Note (Addendum)
Previously had been on heparin.  Changed back to Eliquis after interventional catheterization.

## 2021-12-10 NOTE — Assessment & Plan Note (Signed)
-   We will continue Flomax 

## 2021-12-10 NOTE — Assessment & Plan Note (Addendum)
We will continue Glucotrol XL and Jardiance.  A1c of 8.9.  Diabetes coordinator following these coordinator following.  Increase sliding scale coverage.

## 2021-12-10 NOTE — Progress Notes (Signed)
ANTICOAGULATION CONSULT NOTE - Initial Consult  Pharmacy Consult for Heparin  Indication: ACS/NSTEMI   Allergies  Allergen Reactions   Ramipril Other (See Comments)    Cough with Altace   Shellfish Allergy Nausea And Vomiting   Sulfa Antibiotics Hives    Patient Measurements: Height: 6' 1.5" (186.7 cm) Weight: 105.7 kg (233 lb) IBW/kg (Calculated) : 81.05 Heparin Dosing Weight: 102.6 kg   Vital Signs: Temp: 97.8 F (36.6 C) (07/03 1757) Temp Source: Oral (07/03 1757) BP: 188/111 (07/03 2200) Pulse Rate: 36 (07/03 2200)  Labs: Recent Labs    12/10/21 1754 12/10/21 2105  HGB 15.9  --   HCT 46.9  --   PLT 129*  --   APTT  --  29  LABPROT  --  13.6  INR  --  1.1  HEPARINUNFRC  --  0.33  CREATININE 2.73*  --   TROPONINIHS 51* 919*    Estimated Creatinine Clearance: 31 mL/min (A) (by C-G formula based on SCr of 2.73 mg/dL (H)).   Medical History: Past Medical History:  Diagnosis Date   Allergy    Aortic stenosis    Arthritis    hands   BPH (benign prostatic hyperplasia)    CHF (congestive heart failure) (HCC)    CKD (chronic kidney disease)    Diabetes mellitus    Dyspnea    Hyperlipidemia    Hypertension    Myocardial infarction ([place]) 06/10/2002   Stroke ([place]) 09/2020   Some memory deficits   Vertigo    Wears dentures    full upper    Medications:  (Not in a hospital admission)   Assessment: Pharmacy consulted to dose heparin in this 71 year old male admitted with NSTEMI.  Pt was on Eliquis 5 mg PO BID PTA, last dose on 6/29.   CrCl = 31 ml/min  Baseline labs (7/3 @ 2105):     HL = 0.33 (elevated)     aPTT = 29     INR = 1.1   - baseline HL still slightly elevated from Eliquis PTA, will use aPTT to guide dosing until HL and aPTT correlate.   Goal of Therapy:  Heparin level 0.3-0.7 units/ml aPTT 66 - 102  seconds Monitor platelets by anticoagulation protocol: Yes   Plan:  Give 4000 units bolus x 1 Start heparin infusion at 1400  units/hr Check anti-Xa level in 8 hours and daily while on heparin Continue to monitor H&H and platelets  - Will use aPTT to guide dosing until aPTT and HL are therapeutic.   Ebba Goll D 12/10/2021,10:29 PM

## 2021-12-10 NOTE — Assessment & Plan Note (Addendum)
Repeat lab work in the morning.  Currently at baseline.

## 2021-12-10 NOTE — ED Triage Notes (Signed)
Pt come via [facility] from home with c/o midsternal CP. Pt states it started at 4pm  today. Pt took 1 nitro with some relief. Pt states it radiates to left shoulder. Pt given 1 other nitro and aspirin by EMS.   VSS, 18 g in place. Pt states 6/10 pain at this time.  Pt talking on phone at this time.

## 2021-12-10 NOTE — ED Provider Triage Note (Signed)
Emergency Medicine Provider Triage Evaluation Note  Shane Holloway , a 71 y.o. male  was evaluated in triage.  Pt complains of CP that began 2 hours ago while sitting. H/o multiple heart surgeries. +SOB. No nausea/vomiting. No diaphoresis. No back pain. No dizziness. Got nitro with EMS with improvement of pain. No recent exertional chest pain.   Review of Systems  Positive: CP/SOB Negative: N/V, back pain  Physical Exam  There were no vitals taken for this visit. Gen:   Awake, no distress   Resp:  Normal effort  MSK:   Moves extremities without difficulty  Other:    Medical Decision Making  Medically screening exam initiated at 5:55 PM.  Appropriate orders placed.  Shane Holloway was informed that the remainder of the evaluation will be completed by another provider, this initial triage assessment does not replace that evaluation, and the importance of remaining in the ED until their evaluation is complete.     Marquette Old, PA-C 12/10/21 1758

## 2021-12-11 ENCOUNTER — Inpatient Hospital Stay (HOSPITAL_COMMUNITY)
Admit: 2021-12-11 | Discharge: 2021-12-11 | Disposition: A | Discharge status: Home / Self Care | Payer: Medicare Other | Attending: Family Medicine | Admitting: Family Medicine

## 2021-12-11 ENCOUNTER — Other Ambulatory Visit: Payer: Self-pay

## 2021-12-11 ENCOUNTER — Encounter: Payer: Self-pay | Admitting: Family Medicine

## 2021-12-11 DIAGNOSIS — I10 Essential (primary) hypertension: Secondary | ICD-10-CM

## 2021-12-11 DIAGNOSIS — F0150 Vascular dementia without behavioral disturbance: Secondary | ICD-10-CM | POA: Diagnosis not present

## 2021-12-11 DIAGNOSIS — I214 Non-ST elevation (NSTEMI) myocardial infarction: Secondary | ICD-10-CM

## 2021-12-11 DIAGNOSIS — I5042 Chronic combined systolic (congestive) and diastolic (congestive) heart failure: Secondary | ICD-10-CM

## 2021-12-11 DIAGNOSIS — N1832 Chronic kidney disease, stage 3b: Secondary | ICD-10-CM | POA: Diagnosis not present

## 2021-12-11 DIAGNOSIS — E669 Obesity, unspecified: Secondary | ICD-10-CM | POA: Diagnosis present

## 2021-12-11 DIAGNOSIS — I255 Ischemic cardiomyopathy: Secondary | ICD-10-CM | POA: Diagnosis not present

## 2021-12-11 DIAGNOSIS — I160 Hypertensive urgency: Secondary | ICD-10-CM | POA: Diagnosis not present

## 2021-12-11 LAB — LIPID PANEL
Cholesterol: 171 mg/dL (ref 0–200)
HDL: 28 mg/dL — ABNORMAL LOW (ref >40)
LDL Cholesterol: UNDETERMINED mg/dL (ref 0–99)
Total CHOL/HDL Ratio: 6.1 RATIO
Triglycerides: 509 mg/dL — ABNORMAL HIGH (ref <150)
VLDL: UNDETERMINED mg/dL (ref 0–40)

## 2021-12-11 LAB — BASIC METABOLIC PANEL
Anion gap: 8 (ref 5–15)
BUN: 26 mg/dL — ABNORMAL HIGH (ref 8–23)
CO2: 25 mmol/L (ref 22–32)
Calcium: 8.9 mg/dL (ref 8.9–10.3)
Chloride: 109 mmol/L (ref 98–111)
Creatinine, Ser: 2.53 mg/dL — ABNORMAL HIGH (ref 0.61–1.24)
GFR, Estimated: 26 mL/min — ABNORMAL LOW (ref >60)
Glucose, Bld: 182 mg/dL — ABNORMAL HIGH (ref 70–99)
Potassium: 3.4 mmol/L — ABNORMAL LOW (ref 3.5–5.1)
Sodium: 142 mmol/L (ref 135–145)

## 2021-12-11 LAB — CBC
HCT: 44.1 % (ref 39.0–52.0)
Hemoglobin: 15 g/dL (ref 13.0–17.0)
MCH: 28.8 pg (ref 26.0–34.0)
MCHC: 34 g/dL (ref 30.0–36.0)
MCV: 84.8 fL (ref 80.0–100.0)
Platelets: 122 10*3/uL — ABNORMAL LOW (ref 150–400)
RBC: 5.2 MIL/uL (ref 4.22–5.81)
RDW: 13.6 % (ref 11.5–15.5)
WBC: 7 10*3/uL (ref 4.0–10.5)
nRBC: 0 % (ref 0.0–0.2)

## 2021-12-11 LAB — ECHOCARDIOGRAM COMPLETE
AV Peak grad: 1.9 mmHg
Ao pk vel: 0.69 m/s
Area-P 1/2: 3.81 cm2
Height: 73.5 in
S' Lateral: 4.17 cm
Single Plane A4C EF: 47 %
Weight: 3728 oz

## 2021-12-11 LAB — TROPONIN I (HIGH SENSITIVITY): Troponin I (High Sensitivity): 15109 ng/L (ref <18)

## 2021-12-11 LAB — BRAIN NATRIURETIC PEPTIDE: B Natriuretic Peptide: 288.3 pg/mL — ABNORMAL HIGH (ref 0.0–100.0)

## 2021-12-11 LAB — HEPARIN LEVEL (UNFRACTIONATED)
Heparin Unfractionated: 0.65 IU/mL (ref 0.30–0.70)
Heparin Unfractionated: 0.27 IU/mL — ABNORMAL LOW (ref 0.30–0.70)
Heparin Unfractionated: 0.49 IU/mL (ref 0.30–0.70)

## 2021-12-11 LAB — APTT: aPTT: 78 seconds — ABNORMAL HIGH (ref 24–36)

## 2021-12-11 LAB — LDL CHOLESTEROL, DIRECT: Direct LDL: 58.1 mg/dL (ref 0–99)

## 2021-12-11 MED ORDER — LORAZEPAM 2 MG/ML IJ SOLN
1.0000 mg | INTRAMUSCULAR | Status: DC | PRN
Start: 2021-12-11 — End: 2021-12-24
  Administered 2021-12-22: 1 mg via INTRAVENOUS
  Filled 2021-12-11: qty 1

## 2021-12-11 MED ORDER — PERFLUTREN LIPID MICROSPHERE
1.0000 mL | INTRAVENOUS | Status: AC | PRN
  Administered 2021-12-11: 5 mL via INTRAVENOUS

## 2021-12-11 NOTE — Progress Notes (Signed)
ANTICOAGULATION CONSULT NOTE - Initial Consult  Pharmacy Consult for Heparin  Indication: ACS/NSTEMI   Allergies  Allergen Reactions   Ramipril Other (See Comments)    Cough with Altace   Shellfish Allergy Nausea And Vomiting   Sulfa Antibiotics Hives    Patient Measurements: Height: 6' 1.5" (186.7 cm) Weight: 105.7 kg (233 lb) IBW/kg (Calculated) : 81.05 Heparin Dosing Weight: 102.6 kg   Vital Signs: BP: 155/111 (07/04 0625) Pulse Rate: 78 (07/04 0625)  Labs: Recent Labs    12/10/21 1754 12/10/21 2105 12/11/21 0528 12/11/21 0624  HGB 15.9  --  15.0  --   HCT 46.9  --  44.1  --   PLT 129*  --  122*  --   APTT  --  29  --  78*  LABPROT  --  13.6  --   --   INR  --  1.1  --   --   HEPARINUNFRC  --  0.33  --  0.65  CREATININE 2.73*  --  2.53*  --   TROPONINIHS 51* 919*  --   --      Estimated Creatinine Clearance: 33.4 mL/min (A) (by C-G formula based on SCr of 2.53 mg/dL (H)).   Medical History: Past Medical History:  Diagnosis Date   Allergy    Aortic stenosis    Arthritis    hands   BPH (benign prostatic hyperplasia)    CHF (congestive heart failure) (HCC)    CKD (chronic kidney disease)    Diabetes mellitus    Dyspnea    Hyperlipidemia    Hypertension    Myocardial infarction ([place]) 06/10/2002   Stroke ([place]) 09/2020   Some memory deficits   Vertigo    Wears dentures    full upper    Medications:  (Not in a hospital admission)  Assessment: Pharmacy consulted to dose heparin in this 71 year old male admitted with NSTEMI.  Pt was on Eliquis 5 mg PO BID PTA, last dose on 6/29.   CrCl = 31 ml/min  Baseline labs (7/3 @ 2105):     HL = 0.33 (elevated)     aPTT = 29     INR = 1.1   - baseline HL still slightly elevated from Eliquis PTA, will use aPTT to guide dosing until HL and aPTT correlate.   Goal of Therapy:  Heparin level 0.3-0.7 units/ml aPTT 66 - 102  seconds Monitor platelets by anticoagulation protocol: Yes   Plan:  7/4 @ 0624:   HL = 0.65, aPTT 78 (both therapeutic)  Will use HL to guide dosing from here on. Will continue pt on current rate and recheck HL on 7/4 @ 1400.    Sherif Millspaugh D 12/11/2021,6:49 AM

## 2021-12-11 NOTE — Progress Notes (Signed)
*  PRELIMINARY RESULTS* Echocardiogram 2D Echocardiogram has been performed. Definity IV ultrasound imaging agent used on this study.  Shane Holloway 12/11/2021, 11:46 AM

## 2021-12-11 NOTE — Assessment & Plan Note (Addendum)
Patient not on any home medications for this.  At one point, following admission, he became more agitated and removed his IV in the emergency room and tried to leave.  We were able to contact his brother who convinced him to stay and patient is more calm.  Has been more calm since.  As needed Ativan on board.

## 2021-12-11 NOTE — Progress Notes (Signed)
[facility] Progress Note  Patient: Shane Holloway    KGY:[id number]  DOA: 12/10/2021    Date of Service: the patient was seen and examined on 12/11/2021  Brief hospital course: 71 year old male with past medical history of BPH, CHF, CKD, vascular dementia, CAD status post CABG and aortic valve replacement with bioprosthetic valve on Eliquis who presented to the emergency room on 7/3 with complaints of chest pain and found to have a non-STEMI as well as hypertensive urgency.  Patient was admitted to the hospitalist service and cardiology was consulted.  Eliquis stopped and started on heparin.  Echocardiogram done noted ejection fraction of 35% and grade 1 diastolic dysfunction.  Cardiology planning to take patient for left heart catheterization on 7/5.  Assessment and Plan: Assessment and Plan: * NSTEMI (non-ST elevated myocardial infarction) ([place]) IV heparin and IV nitroglycerin plus high-dose statin therapy plus aspirin.  Appreciate cardiology help.- The patient will be admitted to a progressive unit bed.  Continue beta-blocker.  Cardiology plans to take patient for left heart catheterization tomorrow.  Echocardiogram results as below.  Hypertensive urgency - We will continue his Cozaar and place him on as needed IV hydralazine. - He is getting IV nitroglycerin drip. Started on beta-blocker.  Imdur to be considered post cath.   Chronic combined systolic (congestive) and diastolic (congestive) heart failure (HCC) Echocardiogram done 7/4 notes ejection fraction 35% and grade 1 diastolic dysfunction.  Checking BNP.  Vascular dementia without behavioral disturbance [facility]) Patient not on any home medications for this.  At one point, he became more agitated and removed his IV in the emergency room and tried to leave.  We were able to contact his brother who convinced him to stay and patient is more calm.  We will have some Ativan on board as needed  Chronic kidney disease, stage 3b ([place]) -  She will be hydrated with IV normal saline and will follow BMP. - His renal functions are currently stable.  S/P aortic valve replacement with bioprosthetic valve - We will hold off Eliquis and place him on IV heparin as mentioned above.  Type 2 diabetes mellitus without complication ([place]) - The patient will be placed on supplement coverage with NovoLog. - We will continue Glucotrol XL and Jardiance.  Benign prostatic hyperplasia - We will continue Flomax.  Obesity (BMI 30-39.9) Meets criteria BMI greater than 30       Body mass index is 30.32 kg/m.        Consultants: Cardiology  Procedures: Echocardiogram noting grade 1 diastolic dysfunction and ejection fraction of 35% Plans left heart catheterization 7/5  Antimicrobials: None  Code Status: Full code   Subjective: Patient more calm, denies any complaints of chest pain or shortness of breath  Objective: Vital signs were reviewed and unremarkable. Vitals:   12/11/21 1830 12/11/21 1900  BP: 136/89 (!) 133/91  Pulse: 90 88  Resp: (!) 23 11  Temp:    SpO2: 91% 91%    Intake/Output Summary (Last 24 hours) at 12/11/2021 1919 Last data filed at 12/11/2021 0618 Gross per 24 hour  Intake 27.96 ml  Output 850 ml  Net -822.04 ml   Filed Weights   12/10/21 1757  Weight: 105.7 kg   Body mass index is 30.32 kg/m.  Exam:  General: Alert and oriented x2, no acute distress HEENT: Normocephalic and atraumatic, mucous membranes are moist Cardiovascular: Regular rate and rhythm, S1-S2, 2 out of 6 systolic ejection murmur Respiratory: Clear to auscultation bilaterally Abdomen: Soft, nontender, nondistended,  positive bowel sounds Musculoskeletal: No clubbing or cyanosis, trace pitting edema Skin: No skin breaks, tears or lesions Psychiatry: Appropriate, no evidence of psychoses Neurology: No focal deficits  Data Reviewed: Labs today note creatinine of 2.53, troponin up to 15,000.  Triglycerides at  500  Disposition:  Status is: Inpatient Remains inpatient appropriate because: Planned cardiac intervention    Anticipated discharge date: 7/7  Family Communication: Spoke to brother by phone DVT Prophylaxis:   Heparin infusion    Author: Annita Brod ,MD 12/11/2021 7:19 PM  To reach On-call, see care teams to locate the attending and reach out via [email or web address]. Between 7PM-7AM, please contact night-coverage If you still have difficulty reaching the attending provider, please page the [facility] (Director on Call) for [facility] on amion for assistance.

## 2021-12-11 NOTE — Hospital Course (Addendum)
71 year old male with past medical history of BPH, CHF, CKD, vascular dementia, CAD status post CABG and aortic valve replacement with bioprosthetic valve on Eliquis who presented to the emergency room on 7/3 with complaints of chest pain and found to have a non-STEMI as well as hypertensive urgency.  Patient was admitted to the hospitalist service and cardiology was consulted.  Eliquis stopped and started on heparin.  Echocardiogram done noted ejection fraction of 35% and grade 1 diastolic dysfunction.  Cardiology planning to take patient for left heart catheterization on 7/5 which noted severe multivessel disease.  Patient underwent interventional catheterization on 7/7 with cardiology doing an IV ultrasound-guided PCI of the SVG to the diagonal. Patient had worsening renal function on 7/8.  Started fluids. 7/9.  Renal function starting getting better, however, patient developed significant cough, COVID test come back positive.  Started steroids. Patient condition so far had improved, off oxygen, currently pending nursing home placement

## 2021-12-11 NOTE — Assessment & Plan Note (Addendum)
Echocardiogram done 7/4 notes ejection fraction 35% and grade 1 diastolic dysfunction.  BNP mildly elevated at 288.  Diuresed prior to cardiac catheterization.  Now appears euvolemic on some gentle IV fluids, post cath

## 2021-12-11 NOTE — ED Notes (Signed)
Other 10am meds are to come from pharmacy.

## 2021-12-11 NOTE — ED Notes (Signed)
New NTG bottle started using low-sorb tubing

## 2021-12-11 NOTE — ED Notes (Signed)
This RN did not receive call from lab regarding last troponin level of 15,000. Informed hospitalist and cardiologist of this level. Report given to next shift RN. Pt denies CP.

## 2021-12-11 NOTE — Progress Notes (Signed)
ANTICOAGULATION CONSULT NOTE -   Pharmacy Consult for Heparin  Indication: ACS/NSTEMI   Allergies  Allergen Reactions   Ramipril Other (See Comments)    Cough with Altace   Shellfish Allergy Nausea And Vomiting   Sulfa Antibiotics Hives    Patient Measurements: Height: 6' 1.5" (186.7 cm) Weight: 105.7 kg (233 lb) IBW/kg (Calculated) : 81.05 Heparin Dosing Weight: 102.6 kg   Vital Signs: BP: 135/94 (07/04 1230) Pulse Rate: 82 (07/04 1230)  Labs: Recent Labs    12/10/21 1754 12/10/21 2105 12/11/21 0528 12/11/21 0624 12/11/21 0922  HGB 15.9  --  15.0  --   --   HCT 46.9  --  44.1  --   --   PLT 129*  --  122*  --   --   APTT  --  29  --  78*  --   LABPROT  --  13.6  --   --   --   INR  --  1.1  --   --   --   HEPARINUNFRC  --  0.33  --  0.65  --   CREATININE 2.73*  --  2.53*  --   --   TROPONINIHS 51* 919*  --   --  15,109*     Estimated Creatinine Clearance: 33.4 mL/min (A) (by C-G formula based on SCr of 2.53 mg/dL (H)).   Medical History: Past Medical History:  Diagnosis Date   Allergy    Aortic stenosis    Arthritis    hands   BPH (benign prostatic hyperplasia)    CHF (congestive heart failure) (HCC)    CKD (chronic kidney disease)    Diabetes mellitus    Dyspnea    Hyperlipidemia    Hypertension    Myocardial infarction ([place]) 06/10/2002   Stroke ([place]) 09/2020   Some memory deficits   Vertigo    Wears dentures    full upper    Medications:  (Not in a hospital admission)  Assessment: Pharmacy consulted to dose heparin in this 71 year old male admitted with NSTEMI.  Pt was on Eliquis 5 mg PO BID PTA, last dose on 6/29.   CrCl = 31 ml/min  Baseline labs (7/3 @ 2105):     HL = 0.33 (elevated)     aPTT = 29     INR = 1.1   - baseline HL still slightly elevated from Eliquis PTA, will use aPTT to guide dosing until HL and aPTT correlate.   7/4 @ 0624:  HL = 0.65, aPTT 78 (both therapeutic)    Goal of Therapy:  Heparin level 0.3-0.7  units/ml aPTT 66 - 102  seconds Monitor platelets by anticoagulation protocol: Yes   Plan:  7/4 1419- patient pulled out IV and heparin drip stopped. Restarted at 1455 and level was sent then - which will not be accurate. \Will reschedule HL for 2300 Will use HL to guide dosing from here on. CBC daily  Chabely Norby A 12/11/2021,3:35 PM

## 2021-12-11 NOTE — ED Notes (Signed)
Pt alert, stating he wants to go home. Explained to pt that he is admitted and being treated for NSTEMI.

## 2021-12-11 NOTE — Progress Notes (Signed)
ANTICOAGULATION CONSULT NOTE -   Pharmacy Consult for Heparin  Indication: ACS/NSTEMI   Allergies  Allergen Reactions   Ramipril Other (See Comments)    Cough with Altace   Shellfish Allergy Nausea And Vomiting   Sulfa Antibiotics Hives    Patient Measurements: Height: 6' 1.5" (186.7 cm) Weight: 105.7 kg (233 lb) IBW/kg (Calculated) : 81.05 Heparin Dosing Weight: 102.6 kg   Vital Signs: Temp: 98.2 F (36.8 C) (07/04 1623) Temp Source: Oral (07/04 1623) BP: 115/83 (07/04 2200) Pulse Rate: 86 (07/04 2130)  Labs: Recent Labs    12/10/21 1754 12/10/21 2105 12/10/21 2105 12/11/21 0528 12/11/21 0624 12/11/21 0922 12/11/21 1455 12/11/21 2233  HGB 15.9  --   --  15.0  --   --   --   --   HCT 46.9  --   --  44.1  --   --   --   --   PLT 129*  --   --  122*  --   --   --   --   APTT  --  29  --   --  78*  --   --   --   LABPROT  --  13.6  --   --   --   --   --   --   INR  --  1.1  --   --   --   --   --   --   HEPARINUNFRC  --  0.33   < >  --  0.65  --  0.27* 0.49  CREATININE 2.73*  --   --  2.53*  --   --   --   --   TROPONINIHS 51* 919*  --   --   --  15,109*  --   --    < > = values in this interval not displayed.     Estimated Creatinine Clearance: 33.4 mL/min (A) (by C-G formula based on SCr of 2.53 mg/dL (H)).   Medical History: Past Medical History:  Diagnosis Date   Allergy    Aortic stenosis    Arthritis    hands   BPH (benign prostatic hyperplasia)    CHF (congestive heart failure) (HCC)    CKD (chronic kidney disease)    Diabetes mellitus    Dyspnea    Hyperlipidemia    Hypertension    Myocardial infarction ([place]) 06/10/2002   Stroke ([place]) 09/2020   Some memory deficits   Vertigo    Wears dentures    full upper    Medications:  (Not in a hospital admission)  Assessment: Pharmacy consulted to dose heparin in this 71 year old male admitted with NSTEMI.  Pt was on Eliquis 5 mg PO BID PTA, last dose on 6/29.   CrCl = 31 ml/min  Baseline  labs (7/3 @ 2105):     HL = 0.33 (elevated)     aPTT = 29     INR = 1.1   - baseline HL still slightly elevated from Eliquis PTA, will use aPTT to guide dosing until HL and aPTT correlate.   7/4 @ 0624:  HL = 0.65, aPTT 78 (both therapeutic)  7/4 @ 2233:  HL = 0.49 therapeutic X 1    Goal of Therapy:  Heparin level 0.3-0.7 units/ml aPTT 66 - 102  seconds Monitor platelets by anticoagulation protocol: Yes   Plan:  7/4:  HL @ 2233 = 0.49, therapeutic X 1  Will continue pt on  current rate and recheck HL on 7/5 @ 0700.   Shontel Santee D 12/11/2021,11:38 PM

## 2021-12-11 NOTE — Consult Note (Signed)
Cardiology Consultation:   Patient ID: Shane [facility]ay; [id number]; 1951/03/13   Admit date: 12/10/2021 Date of Consult: 12/11/2021  Primary Care Provider: Caryl Never, MD Primary Cardiologist: [facility] Primary Electrophysiologist:  None   Patient Profile:   Shane [facility]ay is a 71 y.o. male with a hx of CAD status post three-vessel CABG in 2009 with redo two-vessel CABG in 2015 at [facility] with SVG to OM and SVG to RCA, aortic root repair, aortic stenosis status post bioprosthetic AVR in 2015 at [facility], refractory SVT, embolic CVA, CKD stage III, cognitive impairment, left upper extremity DVT on apixaban, DM2, HTN, HLD, and BPH who is being seen today for the evaluation of NSTEMI at the request of Dr. Sidney Holloway.  History of Present Illness:   Shane Holloway is followed by [facility] cardiology.  Echo from 04/2021, to evaluate for cardiac source of CVA demonstrated an intact intra-atrial septum with no left atrial appendage thrombus noted.  Most recent ischemic evaluation via nuclear stress test in 05/2021 was felt to probably be normal with a small in size, moderate fixed perfusion abnormality involving the apical segments consistent with probable subtle scar versus artifact.  LVEF 54%.   He was admitted to [facility] in 03/2021 with SVT.  He was evaluated by our group for refractory SVT concerning for AV nodal reentry tachycardia supported by response to adenosine.  He was placed on amiodarone with recommendation to possibly discontinue this down the road.   He presented to [facility] on 12/10/2021 with onset of substernal chest pain at rest without associated symptoms.leading up to his chest pain yesterday he had been asymptomatic.  Pain was described as an 8 out of 10 and felt similar to what he experienced leading up to his most recent CABG.  He had been off apixaban for 3 days prior in preparation for dental extraction.  Initial high-sensitivity opponent 51 with a delta troponin of 919.  BUN 28, serum  creatinine 2.73 which appears to be around his baseline.  Hgb 15.9, PLT 129.  Chest x-ray showed no active cardiopulmonary disease.  EKG showed sinus rhythm with frequent PVCs, 86 bpm, left axis deviation, incomplete RBBB, possible prior anterior infarct, nonspecific ST-T changes, baseline artifact.  Currently, he is back to baseline and without chest pain, dyspnea, dizziness, presyncope, or syncope.  He is asking to go home.   Past Medical History:  Diagnosis Date   Allergy    Aortic stenosis    Arthritis    hands   BPH (benign prostatic hyperplasia)    CHF (congestive heart failure) ([facility])    CKD (chronic kidney disease)    Diabetes mellitus    Dyspnea    Hyperlipidemia    Hypertension    Myocardial infarction ([facility]ane [facility]) 06/10/2002   Stroke ([place]) 09/2020   Some memory deficits   Vertigo    Wears dentures    full upper    Past Surgical History:  Procedure Laterality Date   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  Redo of 2V CABG, Aortic Root repair and AVR   CATARACT EXTRACTION W/PHACO Right 11/19/2021   Procedure: CATARACT EXTRACTION PHACO AND INTRAOCULAR LENS PLACEMENT ([facility]) RIGHT DIABETIC;  Surgeon: Shane Bear, MD;  Location: [place];  Service: Ophthalmology;  Laterality: Right;  7.32 0:54.3   CATARACT EXTRACTION W/PHACO Left 12/03/2021   Procedure: CATARACT EXTRACTION PHACO AND INTRAOCULAR LENS PLACEMENT ([facility]) LEFT DIABETIC;  Surgeon: Shane Richards, MD;  Location: [place];  Service: Ophthalmology;  Laterality: Left;  LEAVE FIRST CASE Diabetic 3.74 00:36.3   CORONARY ARTERY BYPASS GRAFT  2009   x4; stents to diagonal a mid right coronary lesion; drug eluting stent in the mid RCA as well as ostium of the diagonal with a cutting balloon angioplasty of the diagonal.   JOINT REPLACEMENT       Home Meds: Prior to Admission medications   Medication Sig Start Date End Date Taking? Authorizing Provider   amiodarone (PACERONE) 200 MG tablet Take 1 tablet (200 mg total) by mouth daily. 03/21/21  Yes Shane Ishikawa, MD  amLODipine (NORVASC) 2.5 MG tablet Take 2.5 mg by mouth daily.   Yes [provider]  atorvastatin (LIPITOR) 80 MG tablet Take 80 mg by mouth daily. 12/12/20  Yes [provider]  Cholecalciferol 125 MCG (5000 UT) TABS Take 1 tablet by mouth daily.   Yes [provider]  cyclobenzaprine (FLEXERIL) 5 MG tablet Take 5 mg by mouth 2 (two) times daily. 12/10/21  Yes [provider]  ELIQUIS 5 MG TABS tablet Take 5 mg by mouth 2 (two) times daily. 03/09/21  Yes [provider]  finasteride (PROSCAR) 5 MG tablet Take 5 mg by mouth daily.   Yes [provider]  furosemide (LASIX) 20 MG tablet Take 1 tablet (20 mg total) by mouth daily. 09/15/11 12/10/21 Yes Shane Haber, MD  glipiZIDE (GLUCOTROL XL) 10 MG 24 hr tablet Take 10 mg by mouth daily. 11/14/21  Yes [provider]  JARDIANCE 10 MG TABS tablet Take 10 mg by mouth daily. 03/09/21  Yes [provider]  losartan (COZAAR) 100 MG tablet Take 100 mg by mouth daily. 11/29/21  Yes [provider]  metoprolol tartrate (LOPRESSOR) 50 MG tablet Take 1 tablet (50 mg total) by mouth 2 (two) times daily. 03/21/21  Yes [place] Ishikawa, MD  Tamsulosin HCl (FLOMAX) 0.4 MG CAPS TAKE ONE CAPSULE BY MOUTH EVERY DAY Patient taking differently: Take 0.4 mg by mouth daily. 12/27/11  Yes Jeffery, Chelle, PA  benzonatate (TESSALON PERLES) 100 MG capsule Take 1 capsule (100 mg total) by mouth 3 (three) times daily as needed for cough. 05/14/21   Shane Brill, MD    Inpatient Medications: Scheduled Meds:  amiodarone  200 mg Oral Daily   amLODipine  2.5 mg Oral Daily   aspirin EC  81 mg Oral Daily   atorvastatin  80 mg Oral Daily   cholecalciferol  5,000 Units Oral Daily   cyclobenzaprine  5 mg Oral BID   empagliflozin  10 mg Oral Daily   finasteride  5 mg Oral  Daily   furosemide  20 mg Oral Daily   glipiZIDE  10 mg Oral Daily   losartan  100 mg Oral Daily   metoprolol tartrate  50 mg Oral BID   tamsulosin  0.4 mg Oral Daily   Continuous Infusions:  sodium chloride 100 mL/hr at 12/11/21 0319   heparin 1,400 Units/hr (12/11/21 0318)   nitroGLYCERIN 40 mcg/min (12/11/21 0608)   PRN Meds: acetaminophen, benzonatate, hydrALAZINE, morphine injection, nitroGLYCERIN, ondansetron (ZOFRAN) IV  Allergies:   Allergies  Allergen Reactions   Ramipril Other (See Comments)    Cough with Altace   Shellfish Allergy Nausea And Vomiting   Sulfa Antibiotics Hives    Social History:   Social History   Socioeconomic History   Marital status: Legally Separated    Spouse name: Not on file   Number  of children: Not on file   Years of education: Not on file   Highest education level: Not on file  Occupational History   Occupation: Full time    Comment: Owns own business  Tobacco Use   Smoking status: Former    Types: Cigarettes    Quit date: 1995    Years since quitting: 28.5   Smokeless tobacco: Former    Quit date: 1995  Scientific laboratory technician Use: [place] used  Substance and Sexual Activity   Alcohol use: No   Drug use: No   Sexual activity: Not on file  Other Topics Concern   Not on file  Social History Narrative   No regular exercise   Social Determinants of Radio broadcast assistant Strain: Not on file  Food Insecurity: Not on file  Transportation Needs: Not on file  Physical Activity: Not on file  Stress: Not on file  Social Connections: Not on file  Intimate Partner Violence: Not on file     Family History:   Family History  Problem Relation Age of Onset   Heart disease Mother    Heart disease Father     ROS:  Review of Systems  Constitutional:  Negative for chills, diaphoresis, fever, malaise/fatigue and weight loss.  HENT:  Negative for congestion.   Eyes:  Negative for discharge and redness.  Respiratory:  Negative  for cough, sputum production, shortness of breath and wheezing.   Cardiovascular:  Positive for chest pain. Negative for palpitations, orthopnea, claudication, leg swelling and PND.  Gastrointestinal:  Negative for abdominal pain, heartburn, nausea and vomiting.  Musculoskeletal:  Negative for falls and myalgias.  Skin:  Negative for rash.  Neurological:  Negative for dizziness, tingling, tremors, sensory change, speech change, focal weakness, loss of consciousness and weakness.  Endo/Heme/Allergies:  Does not bruise/bleed easily.  Psychiatric/Behavioral:  Negative for substance abuse. The patient is not nervous/anxious.   All other systems reviewed and are negative.     Physical Exam/Data:   Vitals:   12/11/21 0625 12/11/21 0630 12/11/21 0700 12/11/21 0730  BP: (!) 155/111 (!) 143/115 (!) 163/109 (!) 153/106  Pulse: 78 76 76 78  Resp: '15 16 17 15  '$ Temp:      TempSrc:      SpO2: 94% 92% 95% 96%  Weight:      Height:        Intake/Output Summary (Last 24 hours) at 12/11/2021 0820 Last data filed at 12/11/2021 0618 Gross per 24 hour  Intake 27.96 ml  Output 850 ml  Net -822.04 ml   Filed Weights   12/10/21 1757  Weight: 105.7 kg   Body mass index is 30.32 kg/m.   Physical Exam: General: Well developed, well nourished, in no acute distress. Head: Normocephalic, atraumatic, sclera non-icteric, no xanthomas, nares without discharge.  Neck: Negative for carotid bruits. JVD not elevated. Lungs: Clear bilaterally to auscultation without wheezes, rales, or rhonchi. Breathing is unlabored. Heart: RRR with S1 S2. I/VI systolic murmur, no rubs, or gallops appreciated. Abdomen: Soft, non-tender, non-distended with normoactive bowel sounds. No hepatomegaly. No rebound/guarding. No obvious abdominal masses. Msk:  Strength and tone appear normal for age. Extremities: No clubbing or cyanosis. No edema. Distal pedal pulses are 2+ and equal bilaterally. Neuro: Alert and oriented X 3. No  facial asymmetry. No focal deficit. Moves all extremities spontaneously. Psych:  Responds to questions appropriately with a normal affect.   EKG:  The EKG was personally reviewed and demonstrates: sinus rhythm  with frequent PVCs, 86 bpm, left axis deviation, incomplete RBBB, possible prior anterior infarct, nonspecific ST-T changes, baseline artifact Telemetry:  Telemetry was personally reviewed and demonstrates: SR  Weights: [employer]   12/10/21 1757  Weight: 105.7 kg    Relevant CV Studies:  Myocardial perfusion SPECT imaging 05/29/2021 [facility]): - Probably normal myocardial perfusion study. However, ejection fraction may be slightly decreased from prior and the left ventricle appears more dilated; would correlate with echocardiography and/or MR if clinically indicated.  - There is a small in size, moderate fixed perfusion abnormality involving the apical segments. This is consistent with probable subtle scar versus artifact. There is apparent decreased uptake in the inferior wall, new from prior, but this is adjacent to bowel uptake and may be artifactual.  - Left ventricular systolic function is borderline normal. Post stress the ejection fraction is 54%. The left ventricle is dilated in size.  - No significant/Significant coronary calcifications were noted on the attenuation CT  - Incidental CT findings: There is atherosclerotic calcification of the aorta and coronary arteries. Gallstones are present in the gallbladder. Small trace bilateral pleural effusions.  __________  TEE 04/23/2021 [facility]): Summary    1. Study to evaluate for cardiac source of CVA.    2. Intact interatrial septum visualized by color flow and agitated saline  imaging including valsalva maneuver.    3. Left atrial appendage is free of thrombus formation.  __________  Limited echo with bubble study 03/15/2021 [facility]): Summary    1. Limited study to assess for ASD/PFO with bubble study.    2. Bubble study is  inconclusive due to poor visualization.    3. Consider TEE or alternative study if clinically indicated.  __________  2D echo 01/09/2021 [facility]): Summary    1. The left ventricle is normal in size with normal wall thickness.    2. The left ventricular systolic function is normal, LVEF is visually  estimated at > 55%.    3. There is decreased contractile function involving the apical anterior  segment(s).    4. Mitral annular calcification is present.    5. Aortic valve replacement (27 mm bioprosthetic, implantation date:  12/27/2013).    6. Aortic valve Doppler indices are consistent with normal prosthetic valve  function.    7. The right ventricle is normal in size, with normal systolic function.    8. There is insufficient TR signal to accurately estimate pulmonary artery  systolic pressure.  __________  Nuclear medicine myocardial perfusion SPECT imaging 03/27/2020 [facility]): - Low risk probably normal myocardial perfusion study  - There is a very small, mildly severe, fixed perfusion defect involving  the apical segment. This is likely due to subtle scar.  - Left ventricular systolic function is normal. Post stress the ejection  fraction is > 60%.  - Attenuation CT scan shows post CABG and aortic valve replacement  findings  - Coronary calcifications are noted  __________  2D echo 03/27/2020 [facility]): Summary    1. The left ventricle is normal in size with moderately increased wall  thickness.    2. The left ventricular systolic function is normal, LVEF is visually  estimated at 55%.    3. There is grade I diastolic dysfunction (impaired relaxation).    4. The right ventricle is not well visualized but probably normal in size,  with mildly reduced systolic function.    5. Aortic valve replacement (27 mm Edwards bioprosthetic, implantation date:  12/2013).    6. Aortic valve Doppler indices  are consistent with normal prosthetic valve  function.    7. Dilated pulmonary artery -  mild.    8. Dilated pulmonary artery - mild.    9. There is mild regurgitation of the prosthetic aortic valve.    10. The pulmonary artery is dilated. __________  Limited echo 05/24/2019 [facility]): Summary    1. Limited study to assess ventricular function.    2. The left ventricle is normal in size with moderately increased wall  thickness.    3. Normal left ventricular systolic function, ejection fraction > 55%.    4. Diastolic dysfunction - grade I (normal filling pressures).    5. The right ventricle is not well visualized but probably normal in size.    6. Degenerative mitral valve disease - normal.    7. Aortic valve replacement (size:  27 mm bioprosthetic, implantation date:  12/24/2013).    8. The prosthetic aortic valve leaflets are not well visualized.    9. Prosthetic aortic valve regurgitation - no.    10. Aortic root dilated.    11. Graft is present in the ascending aorta.  __________  Nuclear medicine myocardial perfusion SPECT imaging 02/17/2019 [facility]): Impressions:  - Normal myocardial perfusion study  - No evidence for significant ischemia or scar is noted.  - Post stress: Global systolic function is normal. The ejection fraction  was greater than 65%.  - Attenuation CT scan shows post CABG and AVR findings __________  2D echo 01/29/2019 [facility]):  Left ventricular hypertrophy - moderate   Normal left ventricular systolic function, ejection fraction > 73%   Diastolic dysfunction - grade I (normal filling pressures)   Degenerative mitral valve disease   Aortic valve replacement (27 mm Edwards bioprosthetic valve implanted  12/24/2013)   Dilated ascending aorta   Dilated pulmonary artery - mild   Probably normal right ventricular systolic function __________  Nuclear medicine myocardial perfusion SPECT imaging 03/11/2017 [facility]): Impressions:  - Normal myocardial perfusion study   - Post stress:  The ejection fraction calculated at 56%.    - There is a small  in size, mild in severity, fixed defect involving the  mid inferior and basal inferior segments.  This is consistent with  attenuation artifact.   - Compared to the PET study of 07/04/2014, mild diaphargmatic attenuation  and fixed inferior defect are now present.  Subtle mid anteroseptal defect  is no longer present.  __________  2D echo 03/11/2017 [facility]):  Left ventricular hypertrophy - moderate   Mildly decreased left ventricular systolic function, ejection fraction  45 to 41%   Diastolic dysfunction - grade II (elevated filling pressures)   Degenerative mitral valve disease   Mitral annular calcification   Dilated left Shane - mild to moderate   Aortic valve replacement (28 mm Edwards bioprosthetic valve implanted  12/24/13)   Dilated ascending aorta   Normal right ventricular systolic function   Elevated pulmonary artery systolic pressure - mild  __________  2D echo 02/21/2016 [facility]):  Bioprosthetic aortic valve replacement (27 mm Edwards; 12/24/13)   Ascending aorta replacement (28 mm Dacron graft; 12/24/13)   Left ventricular hypertrophy - moderate to severe   Normal left ventricular systolic function, ejection fraction 55 to 93%   Diastolic dysfunction - grade I (normal filling pressures)   Dilated left Shane - mild   Dilated ascending aorta   Normal right ventricular systolic function  __________  See Care Everywhere for remaining studies.  Laboratory Data:  Chemistry Recent Labs  Lab 12/10/21 1754  12/11/21 0528  NA 141 142  K 3.6 3.4*  CL 106 109  CO2 27 25  GLUCOSE 264* 182*  BUN 28* 26*  CREATININE 2.73* 2.53*  CALCIUM 9.2 8.9  GFRNONAA 24* 26*  ANIONGAP 8 8    No results for input(s): "PROT", "ALBUMIN", "AST", "ALT", "ALKPHOS", "BILITOT" in the last 168 hours. Hematology Recent Labs  Lab 12/10/21 1754 12/11/21 0528  WBC 7.6 7.0  RBC 5.46 5.20  HGB 15.9 15.0  HCT 46.9 44.1  MCV 85.9 84.8  MCH 29.1 28.8  MCHC 33.9 34.0  RDW  13.6 13.6  PLT 129* 122*   Cardiac EnzymesNo results for input(s): "TROPONINI" in the last 168 hours. No results for input(s): "TROPIPOC" in the last 168 hours.  BNPNo results for input(s): "BNP", "PROBNP" in the last 168 hours.  DDimer No results for input(s): "DDIMER" in the last 168 hours.  Radiology/Studies:  DG Chest 2 View  Result Date: 12/10/2021 IMPRESSION: 1. No active cardiopulmonary disease. 2.  Aortic Atherosclerosis (ICD10-I70.0). Electronically Signed   By: Iven Finn M.D.   On: 12/10/2021 18:25    Assessment and Plan:   CAD status post CABG status post redo CABG in 2015 with NSTEMI:  -Currently, chest pain free -High-sensitivity troponin 919 last evening, continue to cycle until peak  -Heparin drip  -Echo pending  -Ischemic evaluation is not straightforward given known multivessel disease requiring CABG with redo CABG in 2015, in the context of underlying renal dysfunction with a serum creatinine of 2.7 and mild thrombocytopenia  -Will need to discuss LHC with interventional cardiology  -Wean off nitro drip as tolerated  -From a cardiac perspective, the patient can eat today  History of aortic root repair/bioprosthetic AVR:  -Echo pending  -Follow-up with [facility] cardiology   History of refractory SVT:  -Maintaining sinus rhythm  -Remains on PTA amiodarone and Lopressor   History of left upper extremity DVT:  -PTA apixaban on hold  -Heparin drip   CKD stage IIIb:  -Renal function appears at baseline   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:  -Has been maintained on apixaban in the outpatient setting given history of left upper extremity DVT   HTN:  -Blood pressure is elevated this morning, though he has not yet received any of his antihypertensives  -Continue current therapy with recommendation to titrate antihypertensive therapy as indicated   HLD/hypertriglyceridemia:  -Triglyceride 509 on fasting sample this morning  -Direct LDL pending -Atorvastatin 80 mg   -Will likely need fenofibrate/Vascepa at the discretion of his primary cardiology team  Thrombocytopenia: -Appears stable -Monitor on heparin drip  Cognitive impairment: -Uncertain baseline   For questions or updates, please contact [facility] Please consult [email or web address] for contact info under Cardiology/STEMI.   Signed, Christell Faith, PA-C [place] Pager: [phone] 12/11/2021, 8:20 AM

## 2021-12-11 NOTE — Assessment & Plan Note (Signed)
Meets criteria BMI greater than 30 

## 2021-12-12 ENCOUNTER — Encounter
Admission: EM | Discharge status: Against Medical Advice | Payer: Self-pay | Source: Home / Self Care | Attending: Internal Medicine

## 2021-12-12 DIAGNOSIS — I2510 Atherosclerotic heart disease of native coronary artery without angina pectoris: Secondary | ICD-10-CM | POA: Diagnosis not present

## 2021-12-12 DIAGNOSIS — I25810 Atherosclerosis of coronary artery bypass graft(s) without angina pectoris: Secondary | ICD-10-CM | POA: Diagnosis not present

## 2021-12-12 DIAGNOSIS — F0150 Vascular dementia without behavioral disturbance: Secondary | ICD-10-CM | POA: Diagnosis not present

## 2021-12-12 DIAGNOSIS — E1165 Type 2 diabetes mellitus with hyperglycemia: Secondary | ICD-10-CM

## 2021-12-12 DIAGNOSIS — I5042 Chronic combined systolic (congestive) and diastolic (congestive) heart failure: Secondary | ICD-10-CM | POA: Diagnosis not present

## 2021-12-12 DIAGNOSIS — I214 Non-ST elevation (NSTEMI) myocardial infarction: Secondary | ICD-10-CM | POA: Diagnosis not present

## 2021-12-12 HISTORY — PX: RIGHT HEART CATH AND CORONARY/GRAFT ANGIOGRAPHY: CATH118265

## 2021-12-12 LAB — CBC
HCT: 41.3 % (ref 39.0–52.0)
Hemoglobin: 13.9 g/dL (ref 13.0–17.0)
MCH: 28.6 pg (ref 26.0–34.0)
MCHC: 33.7 g/dL (ref 30.0–36.0)
MCV: 85 fL (ref 80.0–100.0)
Platelets: 107 10*3/uL — ABNORMAL LOW (ref 150–400)
RBC: 4.86 MIL/uL (ref 4.22–5.81)
RDW: 13.5 % (ref 11.5–15.5)
WBC: 8.7 10*3/uL (ref 4.0–10.5)
nRBC: 0 % (ref 0.0–0.2)

## 2021-12-12 LAB — LIPOPROTEIN A (LPA): Lipoprotein (a): 162.8 nmol/L — ABNORMAL HIGH (ref <75.0)

## 2021-12-12 LAB — BASIC METABOLIC PANEL
Anion gap: 8 (ref 5–15)
BUN: 25 mg/dL — ABNORMAL HIGH (ref 8–23)
CO2: 24 mmol/L (ref 22–32)
Calcium: 8.7 mg/dL — ABNORMAL LOW (ref 8.9–10.3)
Chloride: 108 mmol/L (ref 98–111)
Creatinine, Ser: 2.51 mg/dL — ABNORMAL HIGH (ref 0.61–1.24)
GFR, Estimated: 26 mL/min — ABNORMAL LOW (ref >60)
Glucose, Bld: 211 mg/dL — ABNORMAL HIGH (ref 70–99)
Potassium: 3.4 mmol/L — ABNORMAL LOW (ref 3.5–5.1)
Sodium: 140 mmol/L (ref 135–145)

## 2021-12-12 LAB — HEPARIN LEVEL (UNFRACTIONATED): Heparin Unfractionated: 0.34 IU/mL (ref 0.30–0.70)

## 2021-12-12 LAB — GLUCOSE, CAPILLARY
Glucose-Capillary: 172 mg/dL — ABNORMAL HIGH (ref 70–99)
Glucose-Capillary: 178 mg/dL — ABNORMAL HIGH (ref 70–99)
Glucose-Capillary: 199 mg/dL — ABNORMAL HIGH (ref 70–99)

## 2021-12-12 LAB — CBG MONITORING, ED: Glucose-Capillary: 182 mg/dL — ABNORMAL HIGH (ref 70–99)

## 2021-12-12 LAB — HEMOGLOBIN A1C
Hgb A1c MFr Bld: 8.9 % — ABNORMAL HIGH (ref 4.8–5.6)
Mean Plasma Glucose: 208.73 mg/dL

## 2021-12-12 SURGERY — RIGHT HEART CATH AND CORONARY/GRAFT ANGIOGRAPHY
Anesthesia: Moderate Sedation

## 2021-12-12 MED ORDER — ASPIRIN 81 MG PO CHEW: 81.0000 mg | CHEWABLE_TABLET | ORAL | Status: DC

## 2021-12-12 MED ORDER — ISOSORBIDE MONONITRATE ER 30 MG PO TB24
30.0000 mg | ORAL_TABLET | Freq: Every day | ORAL | Status: DC
  Administered 2021-12-12 – 2021-12-13 (×2): 30 mg via ORAL
  Filled 2021-12-12 (×2): qty 1

## 2021-12-12 MED ORDER — HEPARIN (PORCINE) 25000 UT/250ML-% IV SOLN
1800.0000 [IU]/h | INTRAVENOUS | Status: DC
  Administered 2021-12-12: 1400 [IU]/h via INTRAVENOUS
  Administered 2021-12-13: 1600 [IU]/h via INTRAVENOUS
  Administered 2021-12-14: 1800 [IU]/h via INTRAVENOUS
  Filled 2021-12-12 (×2): qty 250

## 2021-12-12 MED ORDER — IOHEXOL 300 MG/ML  SOLN
INTRAMUSCULAR | Status: DC | PRN
  Administered 2021-12-12: 50 mL

## 2021-12-12 MED ORDER — MIDAZOLAM HCL 2 MG/2ML IJ SOLN
INTRAMUSCULAR | Status: AC
  Filled 2021-12-12: qty 2

## 2021-12-12 MED ORDER — LIDOCAINE HCL (PF) 1 % IJ SOLN
INTRAMUSCULAR | Status: DC | PRN
  Administered 2021-12-12: 10 mL

## 2021-12-12 MED ORDER — SODIUM CHLORIDE 0.9 % WEIGHT BASED INFUSION
1.0000 mL/kg/h | INTRAVENOUS | Status: DC
  Administered 2021-12-12: 1 mL/kg/h via INTRAVENOUS

## 2021-12-12 MED ORDER — INSULIN ASPART 100 UNIT/ML IJ SOLN
0.0000 [IU] | Freq: Three times a day (TID) | INTRAMUSCULAR | Status: DC
  Administered 2021-12-12 – 2021-12-13 (×3): 2 [IU] via SUBCUTANEOUS
  Administered 2021-12-13: 1 [IU] via SUBCUTANEOUS
  Administered 2021-12-13: 3 [IU] via SUBCUTANEOUS
  Filled 2021-12-12 (×4): qty 1

## 2021-12-12 MED ORDER — NITROGLYCERIN IN D5W 200-5 MCG/ML-% IV SOLN
0.0000 ug/min | INTRAVENOUS | Status: DC
  Administered 2021-12-12: 5 ug/min via INTRAVENOUS
  Filled 2021-12-12: qty 250

## 2021-12-12 MED ORDER — HEPARIN (PORCINE) IN NACL 2000-0.9 UNIT/L-% IV SOLN
INTRAVENOUS | Status: DC | PRN
  Administered 2021-12-12: 1000 mL

## 2021-12-12 MED ORDER — HYDRALAZINE HCL 20 MG/ML IJ SOLN: 10.0000 mg | INTRAMUSCULAR | Status: AC | PRN

## 2021-12-12 MED ORDER — FENTANYL CITRATE (PF) 100 MCG/2ML IJ SOLN
INTRAMUSCULAR | Status: AC
  Filled 2021-12-12: qty 2

## 2021-12-12 MED ORDER — ONDANSETRON HCL 4 MG/2ML IJ SOLN: 4.0000 mg | Freq: Four times a day (QID) | INTRAMUSCULAR | Status: DC | PRN

## 2021-12-12 MED ORDER — SODIUM CHLORIDE 0.9 % WEIGHT BASED INFUSION
3.0000 mL/kg/h | INTRAVENOUS | Status: DC
  Administered 2021-12-12: 3 mL/kg/h via INTRAVENOUS

## 2021-12-12 MED ORDER — CARVEDILOL 12.5 MG PO TABS
12.5000 mg | ORAL_TABLET | Freq: Two times a day (BID) | ORAL | Status: DC
  Administered 2021-12-12 – 2021-12-24 (×23): 12.5 mg via ORAL
  Filled 2021-12-12 (×12): qty 1
  Filled 2021-12-12: qty 2
  Filled 2021-12-12 (×11): qty 1

## 2021-12-12 MED ORDER — SODIUM CHLORIDE 0.9% FLUSH
3.0000 mL | Freq: Two times a day (BID) | INTRAVENOUS | Status: DC
  Administered 2021-12-13: 3 mL via INTRAVENOUS

## 2021-12-12 MED ORDER — SODIUM CHLORIDE 0.9 % IV SOLN
250.0000 mL | INTRAVENOUS | Status: DC | PRN
Start: 2021-12-12 — End: 2021-12-24

## 2021-12-12 MED ORDER — SODIUM CHLORIDE 0.9 % IV SOLN: INTRAVENOUS | Status: AC

## 2021-12-12 MED ORDER — SODIUM CHLORIDE 0.9% FLUSH: 3.0000 mL | INTRAVENOUS | Status: DC | PRN

## 2021-12-12 MED ORDER — CLOPIDOGREL BISULFATE 75 MG PO TABS
75.0000 mg | ORAL_TABLET | Freq: Every day | ORAL | Status: DC
  Administered 2021-12-13 – 2021-12-24 (×10): 75 mg via ORAL
  Filled 2021-12-12 (×11): qty 1

## 2021-12-12 MED ORDER — LIDOCAINE HCL 1 % IJ SOLN
INTRAMUSCULAR | Status: AC
  Filled 2021-12-12: qty 20

## 2021-12-12 SURGICAL SUPPLY — 13 items
CANNULA 5F STIFF (CANNULA) ×1 IMPLANT
CATH INFINITI 5 FR IM (CATHETERS) ×1 IMPLANT
CATH INFINITI 5FR JL5 (CATHETERS) ×1 IMPLANT
CATH INFINITI 5FR MULTPACK ANG (CATHETERS) ×1 IMPLANT
CATH SWAN GANZ 7F STRAIGHT (CATHETERS) ×1 IMPLANT
DEVICE CLOSURE MYNXGRIP 5F (Vascular Products) ×1 IMPLANT
PACK CARDIAC CATH (CUSTOM PROCEDURE TRAY) ×3 IMPLANT
PROTECTION STATION PRESSURIZED (MISCELLANEOUS) ×2
SET ATX SIMPLICITY (MISCELLANEOUS) ×1 IMPLANT
SHEATH AVANTI 5FR X 11CM (SHEATH) ×1 IMPLANT
SHEATH AVANTI 7FRX11 (SHEATH) ×1 IMPLANT
STATION PROTECTION PRESSURIZED (MISCELLANEOUS) IMPLANT
WIRE GUIDERIGHT .035X150 (WIRE) ×1 IMPLANT

## 2021-12-12 NOTE — Progress Notes (Signed)
ANTICOAGULATION CONSULT NOTE -   Pharmacy Consult for Heparin  Indication: ACS/NSTEMI   Allergies  Allergen Reactions   Ramipril Other (See Comments)    Cough with Altace   Shellfish Allergy Nausea And Vomiting   Sulfa Antibiotics Hives    Patient Measurements: Height: 6' 1.5" (186.7 cm) Weight: 105.7 kg (233 lb) IBW/kg (Calculated) : 81.05 Heparin Dosing Weight: 102.6 kg   Vital Signs: BP: 137/95 (07/05 0600) Pulse Rate: 79 (07/05 0600)  Labs: Recent Labs    12/10/21 1754 12/10/21 1754 12/10/21 2105 12/11/21 0528 12/11/21 0624 12/11/21 0922 12/11/21 1455 12/11/21 2233 12/12/21 0539  HGB 15.9  --   --  15.0  --   --   --   --  13.9  HCT 46.9  --   --  44.1  --   --   --   --  41.3  PLT 129*  --   --  122*  --   --   --   --  107*  APTT  --   --  29  --  78*  --   --   --   --   LABPROT  --   --  13.6  --   --   --   --   --   --   INR  --   --  1.1  --   --   --   --   --   --   HEPARINUNFRC  --    < > 0.33  --  0.65  --  0.27* 0.49 0.34  CREATININE 2.73*  --   --  2.53*  --   --   --   --  2.51*  TROPONINIHS 51*  --  919*  --   --  15,109*  --   --   --    < > = values in this interval not displayed.     Estimated Creatinine Clearance: 33.7 mL/min (A) (by C-G formula based on SCr of 2.51 mg/dL (H)).   Medical History: Past Medical History:  Diagnosis Date   Allergy    Aortic stenosis    Arthritis    hands   BPH (benign prostatic hyperplasia)    CHF (congestive heart failure) (H[facility])    CKD (chronic kidney disease)    Diabetes mellitus    Dyspnea    Hyperlipidemia    Hypertension    Myocardial infarction ([place]) 06/10/2002   Stroke ([place]) 09/2020   Some memory deficits   Vertigo    Wears dentures    full upper    Medications:  (Not in a hospital admission)  Assessment: Pharmacy consulted to dose heparin in this 71 year old male admitted with NSTEMI.  Pt was on Eliquis 5 mg PO BID PTA, last dose on 6/29.    7/4 @ 0624:  HL = 0.65, aPTT 78 (both  therapeutic)  7/4 @ 2233:  HL = 0.49 therapeutic X 1  7/5 @ 0539:  HL = 0.34 therapeutic    Goal of Therapy:  Heparin level 0.3-0.7 units/ml aPTT 66 - 102  seconds Monitor platelets by anticoagulation protocol: Yes   Plan:  Heparin level is therapeutic. Will continued heparin infusion at 1400 units/hr. Recheck heparin level in 8 hours since heparin level is trending down. CBC daily while on heparin.    Oswald Hillock, PharmD, BCPS 12/12/2021,8:00 AM

## 2021-12-12 NOTE — Progress Notes (Signed)
Nitro gtt paused to see if BP maintains within goal parameters

## 2021-12-12 NOTE — Progress Notes (Signed)
Progress Note  Patient Name: Shane Holloway Date of Encounter: 12/12/2021  Primary Cardiologist: [facility]  Subjective   No further angina. No dyspnea, palpitations, dizziness, presyncope, or syncope. Headache is noted with nitro gtt. Troponin has trended to 15,109. Echo showed an EF of 35-40%, no RWMA, mild LVH, Gr1DD, normal RVSF and ventricular cavity size, and an estimated right atrial pressure of 3 mmHg. BP stable. Renal function state at 2.5. He is for [facility] today.   Inpatient Medications    Scheduled Meds:  amiodarone  200 mg Oral Daily   aspirin EC  81 mg Oral Daily   atorvastatin  80 mg Oral Daily   cholecalciferol  5,000 Units Oral Daily   cyclobenzaprine  5 mg Oral BID   empagliflozin  10 mg Oral Daily   finasteride  5 mg Oral Daily   glipiZIDE  10 mg Oral Daily   metoprolol tartrate  50 mg Oral BID   tamsulosin  0.4 mg Oral Daily   Continuous Infusions:  sodium chloride 100 mL/hr at 12/11/21 1204   heparin 1,400 Units/hr (12/11/21 1455)   nitroGLYCERIN 55 mcg/min (12/12/21 0553)   PRN Meds: acetaminophen, benzonatate, hydrALAZINE, LORazepam, morphine injection, nitroGLYCERIN, ondansetron (ZOFRAN) IV   Vital Signs    Vitals:   12/12/21 0100 12/12/21 0130 12/12/21 0551 12/12/21 0600  BP: (!) 145/102 (!) 125/96 (!) 152/79 (!) 137/95  Pulse:  77 78 79  Resp: (!) 22 (!) '22 20 18  '$ Temp:      TempSrc:      SpO2: 94% 94% 95% 92%  Weight:      Height:       No intake or output data in the 24 hours ending 12/12/21 0739 Filed Weights   12/10/21 1757  Weight: 105.7 kg    Telemetry    SR with artifact - Personally Reviewed  ECG    No new tracings - Personally Reviewed  Physical Exam   GEN: No acute distress.   Neck: No JVD. Cardiac: RRR, no murmurs, rubs, or gallops.  Respiratory: Clear to auscultation bilaterally.  GI: Soft, nontender, non-distended.   MS: No edema; No deformity. Neuro:  Alert and oriented x 3; Nonfocal.  Psych: Normal  affect.  Labs    Chemistry Recent Labs  Lab 12/10/21 1754 12/11/21 0528 12/12/21 0539  NA 141 142 140  K 3.6 3.4* 3.4*  CL 106 109 108  CO2 '27 25 24  '$ GLUCOSE 264* 182* 211*  BUN 28* 26* 25*  CREATININE 2.73* 2.53* 2.51*  CALCIUM 9.2 8.9 8.7*  GFRNONAA 24* 26* 26*  ANIONGAP '8 8 8     '$ Hematology Recent Labs  Lab 12/10/21 1754 12/11/21 0528 12/12/21 0539  WBC 7.6 7.0 8.7  RBC 5.46 5.20 4.86  HGB 15.9 15.0 13.9  HCT 46.9 44.1 41.3  MCV 85.9 84.8 85.0  MCH 29.1 28.8 28.6  MCHC 33.9 34.0 33.7  RDW 13.6 13.6 13.5  PLT 129* 122* 107*    Cardiac EnzymesNo results for input(s): "TROPONINI" in the last 168 hours. No results for input(s): "TROPIPOC" in the last 168 hours.   BNP Recent Labs  Lab 12/11/21 2233  BNP 288.3*     DDimer No results for input(s): "DDIMER" in the last 168 hours.   Radiology    DG Chest 2 View  Result Date: 12/10/2021 IMPRESSION: 1. No active cardiopulmonary disease. 2.  Aortic Atherosclerosis (ICD10-I70.0). Electronically Signed   By: Iven Finn M.D.   On: 12/10/2021 18:25  Cardiac Studies   2D echo 12/11/2021: 1. Left ventricular ejection fraction, by estimation, is 35 to 40%. The  left ventricle has moderately decreased function. The left ventricle has  no regional wall motion abnormalities. There is mild left ventricular  hypertrophy. Left ventricular  diastolic parameters are consistent with Grade I diastolic dysfunction  (impaired relaxation).   2. Right ventricular systolic function is normal. The right ventricular  size is normal. Tricuspid regurgitation signal is inadequate for assessing  PA pressure.   3. The mitral valve is normal in structure. No evidence of mitral valve  regurgitation. No evidence of mitral stenosis.   4. The aortic valve was not well visualized. Aortic valve regurgitation  is not visualized. No aortic stenosis is present.   5. The inferior vena cava is normal in size with greater than 50%   respiratory variability, suggesting right atrial pressure of 3 mmHg. __________  Myocardial perfusion SPECT imaging 05/29/2021 [facility]): - Probably normal myocardial perfusion study. However, ejection fraction may be slightly decreased from prior and the left ventricle appears more dilated; would correlate with echocardiography and/or MR if clinically indicated.  - There is a small in size, moderate fixed perfusion abnormality involving the apical segments. This is consistent with probable subtle scar versus artifact. There is apparent decreased uptake in the inferior wall, new from prior, but this is adjacent to bowel uptake and may be artifactual.  - Left ventricular systolic function is borderline normal. Post stress the ejection fraction is 54%. The left ventricle is dilated in size.  - No significant/Significant coronary calcifications were noted on the attenuation CT  - Incidental CT findings: There is atherosclerotic calcification of the aorta and coronary arteries. Gallstones are present in the gallbladder. Small trace bilateral pleural effusions.  __________   TEE 04/23/2021 [facility]): Summary    1. Study to evaluate for cardiac source of CVA.    2. Intact interatrial septum visualized by color flow and agitated saline  imaging including valsalva maneuver.    3. Left atrial appendage is free of thrombus formation.  __________   Limited echo with bubble study 03/15/2021 [facility]): Summary    1. Limited study to assess for ASD/PFO with bubble study.    2. Bubble study is inconclusive due to poor visualization.    3. Consider TEE or alternative study if clinically indicated.  __________   2D echo 01/09/2021 [facility]): Summary    1. The left ventricle is normal in size with normal wall thickness.    2. The left ventricular systolic function is normal, LVEF is visually  estimated at > 55%.    3. There is decreased contractile function involving the apical anterior  segment(s).    4. Mitral  annular calcification is present.    5. Aortic valve replacement (27 mm bioprosthetic, implantation date:  12/27/2013).    6. Aortic valve Doppler indices are consistent with normal prosthetic valve  function.    7. The right ventricle is normal in size, with normal systolic function.    8. There is insufficient TR signal to accurately estimate pulmonary artery  systolic pressure.  __________   Nuclear medicine myocardial perfusion SPECT imaging 03/27/2020 [facility]): - Low risk probably normal myocardial perfusion study  - There is a very small, mildly severe, fixed perfusion defect involving  the apical segment. This is likely due to subtle scar.  - Left ventricular systolic function is normal. Post stress the ejection  fraction is > 60%.  - Attenuation CT scan shows post  CABG and aortic valve replacement  findings  - Coronary calcifications are noted  __________   2D echo 03/27/2020 [facility]): Summary    1. The left ventricle is normal in size with moderately increased wall  thickness.    2. The left ventricular systolic function is normal, LVEF is visually  estimated at 55%.    3. There is grade I diastolic dysfunction (impaired relaxation).    4. The right ventricle is not well visualized but probably normal in size,  with mildly reduced systolic function.    5. Aortic valve replacement (27 mm Edwards bioprosthetic, implantation date:  12/2013).    6. Aortic valve Doppler indices are consistent with normal prosthetic valve  function.    7. Dilated pulmonary artery - mild.    8. Dilated pulmonary artery - mild.    9. There is mild regurgitation of the prosthetic aortic valve.    10. The pulmonary artery is dilated. __________   Limited echo 05/24/2019 [facility]): Summary    1. Limited study to assess ventricular function.    2. The left ventricle is normal in size with moderately increased wall  thickness.    3. Normal left ventricular systolic function, ejection fraction > 55%.     4. Diastolic dysfunction - grade I (normal filling pressures).    5. The right ventricle is not well visualized but probably normal in size.    6. Degenerative mitral valve disease - normal.    7. Aortic valve replacement (size:  27 mm bioprosthetic, implantation date:  12/24/2013).    8. The prosthetic aortic valve leaflets are not well visualized.    9. Prosthetic aortic valve regurgitation - no.    10. Aortic root dilated.    11. Graft is present in the ascending aorta.  __________   Nuclear medicine myocardial perfusion SPECT imaging 02/17/2019 [facility]): Impressions:  - Normal myocardial perfusion study  - No evidence for significant ischemia or scar is noted.  - Post stress: Global systolic function is normal. The ejection fraction  was greater than 65%.  - Attenuation CT scan shows post CABG and AVR findings __________   2D echo 01/29/2019 [facility]):  Left ventricular hypertrophy - moderate   Normal left ventricular systolic function, ejection fraction > 01%   Diastolic dysfunction - grade I (normal filling pressures)   Degenerative mitral valve disease   Aortic valve replacement (27 mm Edwards bioprosthetic valve implanted  12/24/2013)   Dilated ascending aorta   Dilated pulmonary artery - mild   Probably normal right ventricular systolic function __________   Nuclear medicine myocardial perfusion SPECT imaging 03/11/2017 [facility]): Impressions:  - Normal myocardial perfusion study   - Post stress:  The ejection fraction calculated at 56%.    - There is a small in size, mild in severity, fixed defect involving the  mid inferior and basal inferior segments.  This is consistent with  attenuation artifact.   - Compared to the PET study of 07/04/2014, mild diaphargmatic attenuation  and fixed inferior defect are now present.  Subtle mid anteroseptal defect  is no longer present.  __________   2D echo 03/11/2017 [facility]):  Left ventricular hypertrophy - moderate   Mildly  decreased left ventricular systolic function, ejection fraction  45 to 02%   Diastolic dysfunction - grade II (elevated filling pressures)   Degenerative mitral valve disease   Mitral annular calcification   Dilated left atrium - mild to moderate   Aortic valve replacement (28 mm Edwards bioprosthetic valve implanted  12/24/13)   Dilated ascending aorta   Normal right ventricular systolic function   Elevated pulmonary artery systolic pressure - mild  __________   2D echo 02/21/2016 [facility]):  Bioprosthetic aortic valve replacement (27 mm Edwards; 12/24/13)   Ascending aorta replacement (28 mm Dacron graft; 12/24/13)   Left ventricular hypertrophy - moderate to severe   Normal left ventricular systolic function, ejection fraction 55 to 40%   Diastolic dysfunction - grade I (normal filling pressures)   Dilated left atrium - mild   Dilated ascending aorta   Normal right ventricular systolic function  __________   See Care Everywhere for remaining studies.  Patient Profile     71 y.o. male with history of CAD status post three-vessel CABG in 2009 with redo two-vessel CABG in 2015 at [facility] with SVG to OM and SVG to RCA, aortic root repair, aortic stenosis status post bioprosthetic AVR in 2015 at [facility], refractory SVT, embolic CVA, CKD stage III, cognitive impairment, left upper extremity DVT on apixaban, DM2, HTN, HLD, and BPH who is being seen today for the evaluation of NSTEMI at the request of Dr. Sidney Ace.  Assessment & Plan    CAD status post CABG status post redo CABG in 2015 with NSTEMI:  -Currently, chest pain free -High-sensitivity troponin has trended to 15,109 -Heparin drip  -Echo as above -NPO -R/LHC today -Wean off nitro drip as tolerated   HFrEF secondary to ICM: -R/LHC as above (patient has received IV fluids given renal dysfunction which will affect hemodynamics) -Transition Lopressor to Coreg given cardiomyopathy  -Jardiance -Not on ACEi/ARB/ARNI/MRA  secondary to underlying renal dysfunction  History of aortic root repair/bioprosthetic AVR:  -Echo as above -SBE PPX for dental procedures  -Follow-up with [facility] cardiology   History of refractory SVT:  -Maintaining sinus rhythm  -Remains on PTA amiodarone and Coreg in place of Lopressor given cardiomyopathy  -LFTs and TSH normal on most recent labs  History of left upper extremity DVT:  -PTA apixaban on hold  -Heparin drip   CKD stage IIIb:  -Renal function appears at baseline   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:  -Has been maintained on apixaban in the outpatient setting given history of left upper extremity DVT   HTN:  -Blood pressure stable  -Continue current therapy with recommendation to titrate antihypertensive therapy as indicated   HLD/hypertriglyceridemia:  -Triglyceride 509 on fasting sample this morning  -Direct LDL 58 -Atorvastatin 80 mg  -Will likely need fenofibrate/Vascepa at the discretion of his primary cardiology team   Thrombocytopenia: -Largely stable -Monitor on heparin drip     Shared Decision Making/Informed Consent{  The risks [stroke (1 in 1000), death (1 in 1000), kidney failure [usually temporary] (1 in 500), bleeding (1 in 200), allergic reaction [possibly serious] (1 in 200)], benefits (diagnostic support and management of coronary artery disease) and alternatives of a cardiac catheterization were discussed in detail with Mr. Dillenbeck and he is willing to proceed.   For questions or updates, please contact [facility] Please consult [email or web address] for contact info under Cardiology/STEMI.    Signed, Christell Faith, PA-C [place] Pager: [phone] 12/12/2021, 7:39 AM

## 2021-12-12 NOTE — H&P (View-Only) (Signed)
Progress Note  Patient Name: Shane Holloway Date of Encounter: 12/12/2021  Primary Cardiologist: [facility]  Subjective   No further angina. No dyspnea, palpitations, dizziness, presyncope, or syncope. Headache is noted with nitro gtt. Troponin has trended to 15,109. Echo showed an EF of 35-40%, no RWMA, mild LVH, Gr1DD, normal RVSF and ventricular cavity size, and an estimated right atrial pressure of 3 mmHg. BP stable. Renal function state at 2.5. He is for [facility] today.   Inpatient Medications    Scheduled Meds:  amiodarone  200 mg Oral Daily   aspirin EC  81 mg Oral Daily   atorvastatin  80 mg Oral Daily   cholecalciferol  5,000 Units Oral Daily   cyclobenzaprine  5 mg Oral BID   empagliflozin  10 mg Oral Daily   finasteride  5 mg Oral Daily   glipiZIDE  10 mg Oral Daily   metoprolol tartrate  50 mg Oral BID   tamsulosin  0.4 mg Oral Daily   Continuous Infusions:  sodium chloride 100 mL/hr at 12/11/21 1204   heparin 1,400 Units/hr (12/11/21 1455)   nitroGLYCERIN 55 mcg/min (12/12/21 0553)   PRN Meds: acetaminophen, benzonatate, hydrALAZINE, LORazepam, morphine injection, nitroGLYCERIN, ondansetron (ZOFRAN) IV   Vital Signs    Vitals:   12/12/21 0100 12/12/21 0130 12/12/21 0551 12/12/21 0600  BP: (!) 145/102 (!) 125/96 (!) 152/79 (!) 137/95  Pulse:  77 78 79  Resp: (!) 22 (!) '22 20 18  '$ Temp:      TempSrc:      SpO2: 94% 94% 95% 92%  Weight:      Height:       No intake or output data in the 24 hours ending 12/12/21 0739 Filed Weights   12/10/21 1757  Weight: 105.7 kg    Telemetry    SR with artifact - Personally Reviewed  ECG    No new tracings - Personally Reviewed  Physical Exam   GEN: No acute distress.   Neck: No JVD. Cardiac: RRR, no murmurs, rubs, or gallops.  Respiratory: Clear to auscultation bilaterally.  GI: Soft, nontender, non-distended.   MS: No edema; No deformity. Neuro:  Alert and oriented x 3; Nonfocal.  Psych: Normal  affect.  Labs    Chemistry Recent Labs  Lab 12/10/21 1754 12/11/21 0528 12/12/21 0539  NA 141 142 140  K 3.6 3.4* 3.4*  CL 106 109 108  CO2 '27 25 24  '$ GLUCOSE 264* 182* 211*  BUN 28* 26* 25*  CREATININE 2.73* 2.53* 2.51*  CALCIUM 9.2 8.9 8.7*  GFRNONAA 24* 26* 26*  ANIONGAP '8 8 8     '$ Hematology Recent Labs  Lab 12/10/21 1754 12/11/21 0528 12/12/21 0539  WBC 7.6 7.0 8.7  RBC 5.46 5.20 4.86  HGB 15.9 15.0 13.9  HCT 46.9 44.1 41.3  MCV 85.9 84.8 85.0  MCH 29.1 28.8 28.6  MCHC 33.9 34.0 33.7  RDW 13.6 13.6 13.5  PLT 129* 122* 107*    Cardiac EnzymesNo results for input(s): "TROPONINI" in the last 168 hours. No results for input(s): "TROPIPOC" in the last 168 hours.   BNP Recent Labs  Lab 12/11/21 2233  BNP 288.3*     DDimer No results for input(s): "DDIMER" in the last 168 hours.   Radiology    DG Chest 2 View  Result Date: 12/10/2021 IMPRESSION: 1. No active cardiopulmonary disease. 2.  Aortic Atherosclerosis (ICD10-I70.0). Electronically Signed   By: Iven Finn M.D.   On: 12/10/2021 18:25  Cardiac Studies   2D echo 12/11/2021: 1. Left ventricular ejection fraction, by estimation, is 35 to 40%. The  left ventricle has moderately decreased function. The left ventricle has  no regional wall motion abnormalities. There is mild left ventricular  hypertrophy. Left ventricular  diastolic parameters are consistent with Grade I diastolic dysfunction  (impaired relaxation).   2. Right ventricular systolic function is normal. The right ventricular  size is normal. Tricuspid regurgitation signal is inadequate for assessing  PA pressure.   3. The mitral valve is normal in structure. No evidence of mitral valve  regurgitation. No evidence of mitral stenosis.   4. The aortic valve was not well visualized. Aortic valve regurgitation  is not visualized. No aortic stenosis is present.   5. The inferior vena cava is normal in size with greater than 50%   respiratory variability, suggesting right atrial pressure of 3 mmHg. __________  Myocardial perfusion SPECT imaging 05/29/2021 [facility]): - Probably normal myocardial perfusion study. However, ejection fraction may be slightly decreased from prior and the left ventricle appears more dilated; would correlate with echocardiography and/or MR if clinically indicated.  - There is a small in size, moderate fixed perfusion abnormality involving the apical segments. This is consistent with probable subtle scar versus artifact. There is apparent decreased uptake in the inferior wall, new from prior, but this is adjacent to bowel uptake and may be artifactual.  - Left ventricular systolic function is borderline normal. Post stress the ejection fraction is 54%. The left ventricle is dilated in size.  - No significant/Significant coronary calcifications were noted on the attenuation CT  - Incidental CT findings: There is atherosclerotic calcification of the aorta and coronary arteries. Gallstones are present in the gallbladder. Small trace bilateral pleural effusions.  __________   TEE 04/23/2021 [facility]): Summary    1. Study to evaluate for cardiac source of CVA.    2. Intact interatrial septum visualized by color flow and agitated saline  imaging including valsalva maneuver.    3. Left atrial appendage is free of thrombus formation.  __________   Limited echo with bubble study 03/15/2021 [facility]): Summary    1. Limited study to assess for ASD/PFO with bubble study.    2. Bubble study is inconclusive due to poor visualization.    3. Consider TEE or alternative study if clinically indicated.  __________   2D echo 01/09/2021 [facility]): Summary    1. The left ventricle is normal in size with normal wall thickness.    2. The left ventricular systolic function is normal, LVEF is visually  estimated at > 55%.    3. There is decreased contractile function involving the apical anterior  segment(s).    4. Mitral  annular calcification is present.    5. Aortic valve replacement (27 mm bioprosthetic, implantation date:  12/27/2013).    6. Aortic valve Doppler indices are consistent with normal prosthetic valve  function.    7. The right ventricle is normal in size, with normal systolic function.    8. There is insufficient TR signal to accurately estimate pulmonary artery  systolic pressure.  __________   Nuclear medicine myocardial perfusion SPECT imaging 03/27/2020 [facility]): - Low risk probably normal myocardial perfusion study  - There is a very small, mildly severe, fixed perfusion defect involving  the apical segment. This is likely due to subtle scar.  - Left ventricular systolic function is normal. Post stress the ejection  fraction is > 60%.  - Attenuation CT scan shows post  CABG and aortic valve replacement  findings  - Coronary calcifications are noted  __________   2D echo 03/27/2020 [facility]): Summary    1. The left ventricle is normal in size with moderately increased wall  thickness.    2. The left ventricular systolic function is normal, LVEF is visually  estimated at 55%.    3. There is grade I diastolic dysfunction (impaired relaxation).    4. The right ventricle is not well visualized but probably normal in size,  with mildly reduced systolic function.    5. Aortic valve replacement (27 mm Edwards bioprosthetic, implantation date:  12/2013).    6. Aortic valve Doppler indices are consistent with normal prosthetic valve  function.    7. Dilated pulmonary artery - mild.    8. Dilated pulmonary artery - mild.    9. There is mild regurgitation of the prosthetic aortic valve.    10. The pulmonary artery is dilated. __________   Limited echo 05/24/2019 [facility]): Summary    1. Limited study to assess ventricular function.    2. The left ventricle is normal in size with moderately increased wall  thickness.    3. Normal left ventricular systolic function, ejection fraction > 55%.     4. Diastolic dysfunction - grade I (normal filling pressures).    5. The right ventricle is not well visualized but probably normal in size.    6. Degenerative mitral valve disease - normal.    7. Aortic valve replacement (size:  27 mm bioprosthetic, implantation date:  12/24/2013).    8. The prosthetic aortic valve leaflets are not well visualized.    9. Prosthetic aortic valve regurgitation - no.    10. Aortic root dilated.    11. Graft is present in the ascending aorta.  __________   Nuclear medicine myocardial perfusion SPECT imaging 02/17/2019 [facility]): Impressions:  - Normal myocardial perfusion study  - No evidence for significant ischemia or scar is noted.  - Post stress: Global systolic function is normal. The ejection fraction  was greater than 65%.  - Attenuation CT scan shows post CABG and AVR findings __________   2D echo 01/29/2019 [facility]):  Left ventricular hypertrophy - moderate   Normal left ventricular systolic function, ejection fraction > 10%   Diastolic dysfunction - grade I (normal filling pressures)   Degenerative mitral valve disease   Aortic valve replacement (27 mm Edwards bioprosthetic valve implanted  12/24/2013)   Dilated ascending aorta   Dilated pulmonary artery - mild   Probably normal right ventricular systolic function __________   Nuclear medicine myocardial perfusion SPECT imaging 03/11/2017 [facility]): Impressions:  - Normal myocardial perfusion study   - Post stress:  The ejection fraction calculated at 56%.    - There is a small in size, mild in severity, fixed defect involving the  mid inferior and basal inferior segments.  This is consistent with  attenuation artifact.   - Compared to the PET study of 07/04/2014, mild diaphargmatic attenuation  and fixed inferior defect are now present.  Subtle mid anteroseptal defect  is no longer present.  __________   2D echo 03/11/2017 [facility]):  Left ventricular hypertrophy - moderate   Mildly  decreased left ventricular systolic function, ejection fraction  45 to 17%   Diastolic dysfunction - grade II (elevated filling pressures)   Degenerative mitral valve disease   Mitral annular calcification   Dilated left atrium - mild to moderate   Aortic valve replacement (28 mm Edwards bioprosthetic valve implanted  12/24/13)   Dilated ascending aorta   Normal right ventricular systolic function   Elevated pulmonary artery systolic pressure - mild  __________   2D echo 02/21/2016 [facility]):  Bioprosthetic aortic valve replacement (27 mm Edwards; 12/24/13)   Ascending aorta replacement (28 mm Dacron graft; 12/24/13)   Left ventricular hypertrophy - moderate to severe   Normal left ventricular systolic function, ejection fraction 55 to 94%   Diastolic dysfunction - grade I (normal filling pressures)   Dilated left atrium - mild   Dilated ascending aorta   Normal right ventricular systolic function  __________   See Care Everywhere for remaining studies.  Patient Profile     71 y.o. male with history of CAD status post three-vessel CABG in 2009 with redo two-vessel CABG in 2015 at [facility] with SVG to OM and SVG to RCA, aortic root repair, aortic stenosis status post bioprosthetic AVR in 2015 at [facility], refractory SVT, embolic CVA, CKD stage III, cognitive impairment, left upper extremity DVT on apixaban, DM2, HTN, HLD, and BPH who is being seen today for the evaluation of NSTEMI at the request of Dr. Sidney Ace.  Assessment & Plan    CAD status post CABG status post redo CABG in 2015 with NSTEMI:  -Currently, chest pain free -High-sensitivity troponin has trended to 15,109 -Heparin drip  -Echo as above -NPO -R/LHC today -Wean off nitro drip as tolerated   HFrEF secondary to ICM: -R/LHC as above (patient has received IV fluids given renal dysfunction which will affect hemodynamics) -Transition Lopressor to Coreg given cardiomyopathy  -Jardiance -Not on ACEi/ARB/ARNI/MRA  secondary to underlying renal dysfunction  History of aortic root repair/bioprosthetic AVR:  -Echo as above -SBE PPX for dental procedures  -Follow-up with [facility] cardiology   History of refractory SVT:  -Maintaining sinus rhythm  -Remains on PTA amiodarone and Coreg in place of Lopressor given cardiomyopathy  -LFTs and TSH normal on most recent labs  History of left upper extremity DVT:  -PTA apixaban on hold  -Heparin drip   CKD stage IIIb:  -Renal function appears at baseline   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:  -Has been maintained on apixaban in the outpatient setting given history of left upper extremity DVT   HTN:  -Blood pressure stable  -Continue current therapy with recommendation to titrate antihypertensive therapy as indicated   HLD/hypertriglyceridemia:  -Triglyceride 509 on fasting sample this morning  -Direct LDL 58 -Atorvastatin 80 mg  -Will likely need fenofibrate/Vascepa at the discretion of his primary cardiology team   Thrombocytopenia: -Largely stable -Monitor on heparin drip     Shared Decision Making/Informed Consent{  The risks [stroke (1 in 1000), death (1 in 1000), kidney failure [usually temporary] (1 in 500), bleeding (1 in 200), allergic reaction [possibly serious] (1 in 200)], benefits (diagnostic support and management of coronary artery disease) and alternatives of a cardiac catheterization were discussed in detail with Mr. [place] and he is willing to proceed.   For questions or updates, please contact [place] Please consult [email or web address] for contact info under Cardiology/STEMI.    Signed, Christell Faith, PA-C [place] Pager: [phone] 12/12/2021, 7:39 AM

## 2021-12-12 NOTE — Interval H&P Note (Signed)
History and Physical Interval Note:  12/12/2021 1:43 PM  Shane Holloway  has presented today for surgery, with the diagnosis of Non-STEMI.  The various methods of treatment have been discussed with the patient and family. After consideration of risks, benefits and other options for treatment, the patient has consented to  Procedure(s): RIGHT AND LEFT HEART CATH (N/A) as a surgical intervention.  The patient's history has been reviewed, patient examined, no change in status, stable for surgery.  I have reviewed the patient's chart and labs.  Questions were answered to the patient's satisfaction.    Cath Lab Visit (complete for each Cath Lab visit)  Clinical Evaluation Leading to the Procedure:   ACS: Yes.    Non-ACS:  N/A   [name]

## 2021-12-12 NOTE — Inpatient Diabetes Management (Signed)
Inpatient Diabetes Program Recommendations  AACE/ADA: New Consensus Statement on Inpatient Glycemic Control (2015)  Target Ranges:  Prepandial:   less than 140 mg/dL      Peak postprandial:   less than 180 mg/dL (1-2 hours)      Critically ill patients:  140 - 180 mg/dL    Latest Reference Range & Units 12/11/21 22:33  Hemoglobin A1C 4.8 - 5.6 % 8.9 (H)  (H): Data is abnormally high  Latest Reference Range & Units 12/12/21 05:39  Glucose 70 - 99 mg/dL 211 (H)  (H): Data is abnormally high   Admit with: NSTEMI/ HTN Urgency  History: DM, CHF, CKD  Home DM Meds: Glipizide 10 mg daily        Jardiance 10 mg daily  Current Orders: Glipizide 10 mg daily       Jardiance 10 mg daily   MD- No CBGs checked since admission.    Please consider ordering CBGs TID AC + HS and   Novolog Sensitive Correction Scale/ SSI (0-9 units) TID AC + HS      --Will follow patient during hospitalization--  Wyn Quaker RN, MSN, CDE Diabetes Coordinator Inpatient Glycemic Control Team Team Pager: [phone] (8a-5p)

## 2021-12-12 NOTE — ED Notes (Signed)
RN's to bedside. Pt was up and out of bed. Pt had ripped out both Ivs and urinated all over himself. IV's replaced.

## 2021-12-12 NOTE — Progress Notes (Signed)
[facility] Progress Note  Patient: Shane Holloway    ZOX:[id number]  DOA: 12/10/2021    Date of Service: the patient was seen and examined on 12/12/2021  Brief hospital course: 71 year old male with past medical history of BPH, CHF, CKD, vascular dementia, CAD status post CABG and aortic valve replacement with bioprosthetic valve on Eliquis who presented to the emergency room on 7/3 with complaints of chest pain and found to have a non-STEMI as well as hypertensive urgency.  Patient was admitted to the hospitalist service and cardiology was consulted.  Eliquis stopped and started on heparin.  Echocardiogram done noted ejection fraction of 35% and grade 1 diastolic dysfunction.  Cardiology planning to take patient for left heart catheterization on 7/5.  Assessment and Plan: Assessment and Plan: * NSTEMI (non-ST elevated myocardial infarction) ([place]) IV heparin and IV nitroglycerin plus high-dose statin therapy plus aspirin.  Appreciate cardiology help.- The patient will be admitted to a progressive unit bed.  Continue beta-blocker.  Cardiology plans to take patient for left heart catheterization tomorrow.  Echocardiogram results as below.  Hypertensive urgency - We will continue his Cozaar and place him on as needed IV hydralazine. - He is getting IV nitroglycerin drip. Started on beta-blocker.  Imdur to be considered post cath.   Chronic combined systolic (congestive) and diastolic (congestive) heart failure (HCC) Echocardiogram done 7/4 notes ejection fraction 35% and grade 1 diastolic dysfunction.  BNP mildly elevated at 288.  Continue diuresis.  His cardiac catheterization  Vascular dementia without behavioral disturbance [facility]) Patient not on any home medications for this.  At one point, he became more agitated and removed his IV in the emergency room and tried to leave.  We were able to contact his brother who convinced him to stay and patient is more calm.  We will have some Ativan on  board as needed  Chronic kidney disease, stage 3b ([place]) - She will be hydrated with IV normal saline and will follow BMP. - His renal functions are currently stable.  S/P aortic valve replacement with bioprosthetic valve - We will hold off Eliquis and place him on IV heparin as mentioned above.  Uncontrolled type 2 diabetes mellitus with hyperglycemia, without long-term current use of insulin ([place]) - The patient will be placed on supplement coverage with NovoLog. - We will continue Glucotrol XL and Jardiance.  A1c of 8.9.  Diabetes coordinator following  Benign prostatic hyperplasia - We will continue Flomax.  Obesity (BMI 30-39.9) Meets criteria BMI greater than 30       Body mass index is 30.32 kg/m.        Consultants: Cardiology  Procedures: Echocardiogram noting grade 1 diastolic dysfunction and ejection fraction of 35% Plans left heart catheterization 7/5  Antimicrobials: None  Code Status: Full code   Subjective: Patient denies any current chest pain  Objective: Vital signs were reviewed and unremarkable. Vitals:   12/12/21 0830 12/12/21 0900  BP: (!) 151/95 (!) 166/96  Pulse: 82 84  Resp: 17 16  Temp:    SpO2: 96% 96%   No intake or output data in the 24 hours ending 12/12/21 1249  Filed Weights   12/10/21 1757  Weight: 105.7 kg   Body mass index is 30.32 kg/m.  Exam:  General: Alert and oriented x2, no acute distress HEENT: Normocephalic and atraumatic, mucous membranes are moist Cardiovascular: Regular rate and rhythm, S1-S2, 2 out of 6 systolic ejection murmur Respiratory: Clear to auscultation bilaterally Abdomen: Soft, nontender, nondistended, positive bowel  sounds Musculoskeletal: No clubbing or cyanosis, trace pitting edema Skin: No skin breaks, tears or lesions Psychiatry: Appropriate, no evidence of psychoses Neurology: No focal deficits  Data Reviewed: A1c of 8.9.  Creatinine unchanged from previous day.  BNP of  288.  Disposition:  Status is: Inpatient Remains inpatient appropriate because: Planned cardiac intervention    Anticipated discharge date: 7/7  Family Communication: Spoke to brother by phone DVT Prophylaxis:   Heparin infusion    Author: Annita Brod ,MD 12/12/2021 12:49 PM  To reach On-call, see care teams to locate the attending and reach out via [email or web address]. Between 7PM-7AM, please contact night-coverage If you still have difficulty reaching the attending provider, please page the [facility] (Director on Call) for [facility] on amion for assistance.

## 2021-12-13 ENCOUNTER — Encounter: Payer: Self-pay | Admitting: Internal Medicine

## 2021-12-13 DIAGNOSIS — E1165 Type 2 diabetes mellitus with hyperglycemia: Secondary | ICD-10-CM | POA: Diagnosis not present

## 2021-12-13 DIAGNOSIS — F0150 Vascular dementia without behavioral disturbance: Secondary | ICD-10-CM | POA: Diagnosis not present

## 2021-12-13 DIAGNOSIS — I214 Non-ST elevation (NSTEMI) myocardial infarction: Secondary | ICD-10-CM | POA: Diagnosis not present

## 2021-12-13 DIAGNOSIS — Z953 Presence of xenogenic heart valve: Secondary | ICD-10-CM

## 2021-12-13 DIAGNOSIS — I5042 Chronic combined systolic (congestive) and diastolic (congestive) heart failure: Secondary | ICD-10-CM | POA: Diagnosis not present

## 2021-12-13 DIAGNOSIS — I255 Ischemic cardiomyopathy: Secondary | ICD-10-CM | POA: Diagnosis not present

## 2021-12-13 LAB — GLUCOSE, CAPILLARY
Glucose-Capillary: 145 mg/dL — ABNORMAL HIGH (ref 70–99)
Glucose-Capillary: 161 mg/dL — ABNORMAL HIGH (ref 70–99)
Glucose-Capillary: 220 mg/dL — ABNORMAL HIGH (ref 70–99)
Glucose-Capillary: 214 mg/dL — ABNORMAL HIGH (ref 70–99)

## 2021-12-13 LAB — CBC
HCT: 38.7 % — ABNORMAL LOW (ref 39.0–52.0)
Hemoglobin: 12.9 g/dL — ABNORMAL LOW (ref 13.0–17.0)
MCH: 28.5 pg (ref 26.0–34.0)
MCHC: 33.3 g/dL (ref 30.0–36.0)
MCV: 85.4 fL (ref 80.0–100.0)
Platelets: 102 10*3/uL — ABNORMAL LOW (ref 150–400)
RBC: 4.53 MIL/uL (ref 4.22–5.81)
RDW: 13.7 % (ref 11.5–15.5)
WBC: 6.7 10*3/uL (ref 4.0–10.5)
nRBC: 0 % (ref 0.0–0.2)

## 2021-12-13 LAB — HEPARIN LEVEL (UNFRACTIONATED)
Heparin Unfractionated: 0.28 IU/mL — ABNORMAL LOW (ref 0.30–0.70)
Heparin Unfractionated: 0.3 IU/mL (ref 0.30–0.70)

## 2021-12-13 LAB — BASIC METABOLIC PANEL
Anion gap: 7 (ref 5–15)
BUN: 26 mg/dL — ABNORMAL HIGH (ref 8–23)
CO2: 24 mmol/L (ref 22–32)
Calcium: 8.2 mg/dL — ABNORMAL LOW (ref 8.9–10.3)
Chloride: 108 mmol/L (ref 98–111)
Creatinine, Ser: 2.7 mg/dL — ABNORMAL HIGH (ref 0.61–1.24)
GFR, Estimated: 24 mL/min — ABNORMAL LOW (ref >60)
Glucose, Bld: 171 mg/dL — ABNORMAL HIGH (ref 70–99)
Potassium: 3.1 mmol/L — ABNORMAL LOW (ref 3.5–5.1)
Sodium: 139 mmol/L (ref 135–145)

## 2021-12-13 MED ORDER — SODIUM CHLORIDE 0.9 % IV SOLN: 250.0000 mL | INTRAVENOUS | Status: DC | PRN

## 2021-12-13 MED ORDER — OMEGA-3-ACID ETHYL ESTERS 1 G PO CAPS
2.0000 g | ORAL_CAPSULE | Freq: Two times a day (BID) | ORAL | Status: DC
  Administered 2021-12-13 – 2021-12-24 (×23): 2 g via ORAL
  Filled 2021-12-13 (×23): qty 2

## 2021-12-13 MED ORDER — ISOSORBIDE MONONITRATE ER 30 MG PO TB24
30.0000 mg | ORAL_TABLET | Freq: Two times a day (BID) | ORAL | Status: DC
Start: 2021-12-13 — End: 2021-12-24
  Administered 2021-12-13 – 2021-12-24 (×22): 30 mg via ORAL
  Filled 2021-12-13 (×22): qty 1

## 2021-12-13 MED ORDER — SODIUM CHLORIDE 0.9 % IV SOLN: INTRAVENOUS | Status: DC

## 2021-12-13 MED ORDER — MENTHOL 3 MG MT LOZG
1.0000 | LOZENGE | OROMUCOSAL | Status: DC | PRN
  Administered 2021-12-15 – 2021-12-20 (×3): 3 mg via ORAL
  Filled 2021-12-13 (×4): qty 9

## 2021-12-13 MED ORDER — POTASSIUM CHLORIDE CRYS ER 20 MEQ PO TBCR
40.0000 meq | EXTENDED_RELEASE_TABLET | Freq: Once | ORAL | Status: AC
  Administered 2021-12-13: 40 meq via ORAL
  Filled 2021-12-13: qty 2

## 2021-12-13 MED ORDER — HEPARIN BOLUS VIA INFUSION
1500.0000 [IU] | Freq: Once | INTRAVENOUS | Status: AC
  Administered 2021-12-13: 1500 [IU] via INTRAVENOUS
  Filled 2021-12-13: qty 1500

## 2021-12-13 MED ORDER — ALUM & MAG HYDROXIDE-SIMETH 200-200-20 MG/5ML PO SUSP
30.0000 mL | Freq: Four times a day (QID) | ORAL | Status: DC | PRN
  Administered 2021-12-13: 30 mL via ORAL
  Filled 2021-12-13: qty 30

## 2021-12-13 MED ORDER — SODIUM CHLORIDE 0.9% FLUSH
3.0000 mL | Freq: Two times a day (BID) | INTRAVENOUS | Status: DC
  Administered 2021-12-14 – 2021-12-24 (×16): 3 mL via INTRAVENOUS

## 2021-12-13 MED ORDER — FENOFIBRATE 54 MG PO TABS
54.0000 mg | ORAL_TABLET | Freq: Every day | ORAL | Status: DC
  Administered 2021-12-13 – 2021-12-24 (×12): 54 mg via ORAL
  Filled 2021-12-13 (×12): qty 1

## 2021-12-13 NOTE — Progress Notes (Signed)
ANTICOAGULATION CONSULT NOTE -   Pharmacy Consult for Heparin  Indication: ACS/NSTEMI   Allergies  Allergen Reactions   Ramipril Other (See Comments)    Cough with Altace   Shellfish Allergy Nausea And Vomiting   Sulfa Antibiotics Hives    Patient Measurements: Height: 6' 1.5" (186.7 cm) Weight: 105.7 kg (233 lb) IBW/kg (Calculated) : 81.05 Heparin Dosing Weight: 102.6 kg   Vital Signs: Temp: 97.7 F (36.5 C) (07/06 1100) Temp Source: Oral (07/06 1100) BP: 131/101 (07/06 1100) Pulse Rate: 77 (07/06 1100)  Labs: Recent Labs    12/10/21 1754 12/10/21 1754 12/10/21 2105 12/11/21 0528 12/11/21 0624 12/11/21 0922 12/11/21 1455 12/12/21 0539 12/13/21 0533 12/13/21 0736 12/13/21 1653  HGB 15.9  --   --  15.0  --   --   --  13.9 12.9*  --   --   HCT 46.9  --   --  44.1  --   --   --  41.3 38.7*  --   --   PLT 129*  --   --  122*  --   --   --  107* 102*  --   --   APTT  --   --  29  --  78*  --   --   --   --   --   --   LABPROT  --   --  13.6  --   --   --   --   --   --   --   --   INR  --   --  1.1  --   --   --   --   --   --   --   --   HEPARINUNFRC  --    < > 0.33  --  0.65  --    < > 0.34  --  0.28* 0.30  CREATININE 2.73*  --   --  2.53*  --   --   --  2.51* 2.70*  --   --   TROPONINIHS 51*  --  919*  --   --  15,109*  --   --   --   --   --    < > = values in this interval not displayed.     Estimated Creatinine Clearance: 31.3 mL/min (A) (by C-G formula based on SCr of 2.7 mg/dL (H)).   Medical History: Past Medical History:  Diagnosis Date   Allergy    Aortic stenosis    Arthritis    hands   BPH (benign prostatic hyperplasia)    CHF (congestive heart failure) (HCC)    CKD (chronic kidney disease)    Diabetes mellitus    Dyspnea    Hyperlipidemia    Hypertension    Myocardial infarction ([place]) 06/10/2002   Stroke ([place]) 09/2020   Some memory deficits   Vertigo    Wears dentures    full upper    Medications:  Medications Prior to Admission   Medication Sig Dispense Refill Last Dose   amiodarone (PACERONE) 200 MG tablet Take 1 tablet (200 mg total) by mouth daily. 30 tablet 0 12/10/2021 at 0800   amLODipine (NORVASC) 2.5 MG tablet Take 2.5 mg by mouth daily.   12/10/2021 at 0800   atorvastatin (LIPITOR) 80 MG tablet Take 80 mg by mouth daily.   12/10/2021 at 0800   Cholecalciferol 125 MCG (5000 UT) TABS Take 1 tablet by mouth daily.   12/10/2021 at  0800   cyclobenzaprine (FLEXERIL) 5 MG tablet Take 5 mg by mouth 2 (two) times daily.   12/10/2021 at 1400   ELIQUIS 5 MG TABS tablet Take 5 mg by mouth 2 (two) times daily.   12/06/2021   finasteride (PROSCAR) 5 MG tablet Take 5 mg by mouth daily.   12/10/2021 at 0800   furosemide (LASIX) 20 MG tablet Take 1 tablet (20 mg total) by mouth daily. 30 tablet 3 12/10/2021 at 0800   glipiZIDE (GLUCOTROL XL) 10 MG 24 hr tablet Take 10 mg by mouth daily.   12/10/2021 at 0800   JARDIANCE 10 MG TABS tablet Take 10 mg by mouth daily.   12/10/2021 at 0800   losartan (COZAAR) 100 MG tablet Take 100 mg by mouth daily.   12/10/2021 at 0800   metoprolol tartrate (LOPRESSOR) 50 MG tablet Take 1 tablet (50 mg total) by mouth 2 (two) times daily. 60 tablet 0 12/10/2021 at 1400   Tamsulosin HCl (FLOMAX) 0.4 MG CAPS TAKE ONE CAPSULE BY MOUTH EVERY DAY (Patient taking differently: Take 0.4 mg by mouth daily.) 30 capsule 2 12/10/2021 at 0800   benzonatate (TESSALON PERLES) 100 MG capsule Take 1 capsule (100 mg total) by mouth 3 (three) times daily as needed for cough. 20 capsule 0 prn at prn    Assessment: Pharmacy consulted to dose heparin in this 71 year old male admitted with NSTEMI.  Pt was on Eliquis 5 mg PO BID PTA, last dose on 6/29.    7/4 @ 0624:  HL = 0.65, aPTT 78 (both therapeutic)  7/4 @ 2233:  HL = 0.49 therapeutic X 1  7/5 @ 0539:  HL = 0.34 therapeutic  7/6 @ 0736:  HL = 0.28 subtherapeutic.  7/6 @ 1653   HL = 0.30 therapeutic   Goal of Therapy:  Heparin level 0.3-0.7 units/ml aPTT 66 - 102  seconds Monitor  platelets by anticoagulation protocol: Yes   Plan:  Heparin level is on lower end of therepeutic. Will give heparin bolus of 1500 units and increase heparin infusion to 1800 units/hr. Recheck heparin level in 8 hours. CBC daily while on heparin.    Pearla Dubonnet, PharmD Clinical Pharmacist 12/13/2021 5:28 PM

## 2021-12-13 NOTE — Progress Notes (Signed)
ANTICOAGULATION CONSULT NOTE -   Pharmacy Consult for Heparin  Indication: ACS/NSTEMI   Allergies  Allergen Reactions   Ramipril Other (See Comments)    Cough with Altace   Shellfish Allergy Nausea And Vomiting   Sulfa Antibiotics Hives    Patient Measurements: Height: 6' 1.5" (186.7 cm) Weight: 105.7 kg (233 lb) IBW/kg (Calculated) : 81.05 Heparin Dosing Weight: 102.6 kg   Vital Signs: Temp: 99.1 F (37.3 C) (07/06 0406) Temp Source: Oral (07/06 0406) BP: 149/95 (07/06 0700) Pulse Rate: 64 (07/06 0700)  Labs: Recent Labs    12/10/21 1754 12/10/21 1754 12/10/21 2105 12/11/21 0528 12/11/21 0624 12/11/21 0922 12/11/21 1455 12/11/21 2233 12/12/21 0539 12/13/21 0533 12/13/21 0736  HGB 15.9  --   --  15.0  --   --   --   --  13.9 12.9*  --   HCT 46.9  --   --  44.1  --   --   --   --  41.3 38.7*  --   PLT 129*  --   --  122*  --   --   --   --  107* 102*  --   APTT  --   --  29  --  78*  --   --   --   --   --   --   LABPROT  --   --  13.6  --   --   --   --   --   --   --   --   INR  --   --  1.1  --   --   --   --   --   --   --   --   HEPARINUNFRC  --    < > 0.33  --  0.65  --    < > 0.49 0.34  --  0.28*  CREATININE 2.73*  --   --  2.53*  --   --   --   --  2.51* 2.70*  --   TROPONINIHS 51*  --  919*  --   --  15,109*  --   --   --   --   --    < > = values in this interval not displayed.     Estimated Creatinine Clearance: 31.3 mL/min (A) (by C-G formula based on SCr of 2.7 mg/dL (H)).   Medical History: Past Medical History:  Diagnosis Date   Allergy    Aortic stenosis    Arthritis    hands   BPH (benign prostatic hyperplasia)    CHF (congestive heart failure) (HCC)    CKD (chronic kidney disease)    Diabetes mellitus    Dyspnea    Hyperlipidemia    Hypertension    Myocardial infarction ([place]) 06/10/2002   Stroke ([place]) 09/2020   Some memory deficits   Vertigo    Wears dentures    full upper    Medications:  Medications Prior to Admission   Medication Sig Dispense Refill Last Dose   amiodarone (PACERONE) 200 MG tablet Take 1 tablet (200 mg total) by mouth daily. 30 tablet 0 12/10/2021 at 0800   amLODipine (NORVASC) 2.5 MG tablet Take 2.5 mg by mouth daily.   12/10/2021 at 0800   atorvastatin (LIPITOR) 80 MG tablet Take 80 mg by mouth daily.   12/10/2021 at 0800   Cholecalciferol 125 MCG (5000 UT) TABS Take 1 tablet by mouth daily.   12/10/2021 at  0800   cyclobenzaprine (FLEXERIL) 5 MG tablet Take 5 mg by mouth 2 (two) times daily.   12/10/2021 at 1400   ELIQUIS 5 MG TABS tablet Take 5 mg by mouth 2 (two) times daily.   12/06/2021   finasteride (PROSCAR) 5 MG tablet Take 5 mg by mouth daily.   12/10/2021 at 0800   furosemide (LASIX) 20 MG tablet Take 1 tablet (20 mg total) by mouth daily. 30 tablet 3 12/10/2021 at 0800   glipiZIDE (GLUCOTROL XL) 10 MG 24 hr tablet Take 10 mg by mouth daily.   12/10/2021 at 0800   JARDIANCE 10 MG TABS tablet Take 10 mg by mouth daily.   12/10/2021 at 0800   losartan (COZAAR) 100 MG tablet Take 100 mg by mouth daily.   12/10/2021 at 0800   metoprolol tartrate (LOPRESSOR) 50 MG tablet Take 1 tablet (50 mg total) by mouth 2 (two) times daily. 60 tablet 0 12/10/2021 at 1400   Tamsulosin HCl (FLOMAX) 0.4 MG CAPS TAKE ONE CAPSULE BY MOUTH EVERY DAY (Patient taking differently: Take 0.4 mg by mouth daily.) 30 capsule 2 12/10/2021 at 0800   benzonatate (TESSALON PERLES) 100 MG capsule Take 1 capsule (100 mg total) by mouth 3 (three) times daily as needed for cough. 20 capsule 0 prn at prn    Assessment: Pharmacy consulted to dose heparin in this 71 year old male admitted with NSTEMI.  Pt was on Eliquis 5 mg PO BID PTA, last dose on 6/29.    7/4 @ 0624:  HL = 0.65, aPTT 78 (both therapeutic)  7/4 @ 2233:  HL = 0.49 therapeutic X 1  7/5 @ 0539:  HL = 0.34 therapeutic  7/6 @ 0736:  HL = 0.28 subtherapeutic.    Goal of Therapy:  Heparin level 0.3-0.7 units/ml aPTT 66 - 102  seconds Monitor platelets by anticoagulation  protocol: Yes   Plan:  Heparin level is slightly subtherapeutic. Will give heparin bolus of 1500 units and increase heparin infusion to 1600 units/hr. Recheck heparin level in 8 hours. CBC daily while on heparin.    Oswald Hillock, PharmD, BCPS 12/13/2021,8:09 AM

## 2021-12-13 NOTE — Care Management Important Message (Signed)
Important Message  Patient Details  Name: CASHTYN POULIOT MRN: [id number] Date of Birth: 01-12-1951   Medicare Important Message Given:  Yes     Dannette Barbara 12/13/2021, 3:28 PM

## 2021-12-13 NOTE — TOC Initial Note (Signed)
Transition of Care [facility]) - Initial/Assessment Note    Patient Details  Name: Shane Holloway MRN: [id number] Date of Birth: 26-Nov-1950  Transition of Care [facility]) CM/SW Contact:    Laurena Slimmer, RN Phone Number: 12/13/2021, 3:47 PM  Clinical Narrative:                  Transition of Care [facility]) Screening Note   Patient Details  Name: Shane Holloway Date of Birth: 15-Nov-1950   Transition of Care [facility]) CM/SW Contact:    Laurena Slimmer, RN Phone Number: 12/13/2021, 3:47 PM    Transition of Care Department [facility]) has reviewed patient and no TOC needs have been identified at this time. We will continue to monitor patient advancement through interdisciplinary progression rounds. If new patient transition needs arise, please place a TOC consult.          Patient Goals and CMS Choice        Expected Discharge Plan and Services                                                Prior Living Arrangements/Services                       Activities of Daily Living Home Assistive Devices/Equipment: None ADL Screening (condition at time of admission) Patient's cognitive ability adequate to safely complete daily activities?: Yes Is the patient deaf or have difficulty hearing?: No Does the patient have difficulty seeing, even when wearing glasses/contacts?: No Does the patient have difficulty concentrating, remembering, or making decisions?: No Patient able to express need for assistance with ADLs?: Yes Does the patient have difficulty dressing or bathing?: No Independently performs ADLs?: Yes (appropriate for developmental age) Does the patient have difficulty walking or climbing stairs?: No Weakness of Legs: None Weakness of Arms/Hands: None  Permission Sought/Granted                  Emotional Assessment              Admission diagnosis:  NSTEMI (non-ST elevated myocardial infarction) ([place]) [I21.4] Patient Active Problem List   Diagnosis  Date Noted   Obesity (BMI 30-39.9) 12/11/2021   NSTEMI (non-ST elevated myocardial infarction) ([place]) 12/10/2021   Hypertensive urgency 12/10/2021   Chronic kidney disease, stage 3b ([place]) 12/10/2021   SVT (supraventricular tachycardia) ([place]) 03/20/2021   S/P aortic valve replacement with bioprosthetic valve 02/22/2021   Vascular dementia without behavioral disturbance ([place]) 09/13/2020   Stage 3 chronic kidney disease ([place]) 04/20/2017   History of stroke 02/22/2014   Benign prostatic hyperplasia 04/16/2012   Uncontrolled type 2 diabetes mellitus with hyperglycemia, without long-term current use of insulin ([place]) 06/27/2009   CORONARY ATHEROSCLEROSIS, ARTERY BYPASS GRAFT 06/27/2009   Essential hypertension 11/24/2008   Chronic combined systolic (congestive) and diastolic (congestive) heart failure ([place]) 11/24/2008   DYSPNEA 11/24/2008   PCP:  Caryl Never, MD Pharmacy:   [facility] ([place]), [place] - [address] [place] [address] Phone: [phone] Fax: [phone]  [facility] [address] [place] - [address] [place] [address] Phone: [phone] Fax: [place], [place] - [address] [place] [address] Phone: [phone] Fax:  [phone]     Social Determinants of Health (SDOH) Interventions    Readmission Risk Interventions     No data to display

## 2021-12-13 NOTE — Progress Notes (Addendum)
[facility] Progress Note  Patient: Shane Holloway    PFX:[id number]  DOA: 12/10/2021    Date of Service: the patient was seen and examined on 12/13/2021  Brief hospital course: 71 year old male with past medical history of BPH, CHF, CKD, vascular dementia, CAD status post CABG and aortic valve replacement with bioprosthetic valve on Eliquis who presented to the emergency room on 7/3 with complaints of chest pain and found to have a non-STEMI as well as hypertensive urgency.  Patient was admitted to the hospitalist service and cardiology was consulted.  Eliquis stopped and started on heparin.  Echocardiogram done noted ejection fraction of 35% and grade 1 diastolic dysfunction.  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  Plan is for interventional cath on 7/7 with PCI.  Assessment and Plan: Assessment and Plan: * NSTEMI (non-ST elevated myocardial infarction) ([place]) IV heparin and IV nitroglycerin plus high-dose statin therapy plus aspirin.    Appreciate cardiology intervention.  Status post cardiac catheterization noting multivessel disease including diseased to graft.  Plan is for interventional cath on 7/7 with PCI.  Hypertensive urgency - We will continue his Cozaar and place him on as needed IV hydralazine. - He is getting IV nitroglycerin drip. Started on beta-blocker.  Imdur to be considered post cath.   Chronic combined systolic (congestive) and diastolic (congestive) heart failure (HCC) Echocardiogram done 7/4 notes ejection fraction 35% and grade 1 diastolic dysfunction.  BNP mildly elevated at 288.  Continue diuresis.    Vascular dementia without behavioral disturbance [facility]) Patient not on any home medications for this.  At one point, he became more agitated and removed his IV in the emergency room and tried to leave.  We were able to contact his brother who convinced  him to stay and patient is more calm.  We will have some Ativan on board as needed  Chronic kidney disease, stage 3b ([place]) - She will be hydrated with IV normal saline and will follow BMP. - His renal functions are currently stable.  S/P aortic valve replacement with bioprosthetic valve - We will hold off Eliquis and place him on IV heparin as mentioned above.  Uncontrolled type 2 diabetes mellitus with hyperglycemia, without long-term current use of insulin ([place]) - The patient will be placed on supplement coverage with NovoLog. - We will continue Glucotrol XL and Jardiance.  A1c of 8.9.  Diabetes coordinator following  Benign prostatic hyperplasia - We will continue Flomax.  Obesity (BMI 30-39.9) Meets criteria BMI greater than 30       Body mass index is 30.32 kg/m.        Consultants: Cardiology  Procedures: Echocardiogram noting grade 1 diastolic dysfunction and ejection fraction of 35% Heart catheterization 7/5 Planned interventional heart catheterization 7/7  Antimicrobials: None  Code Status: Full code   Subjective: Patient currently denies any chest pain or shortness of breath  Objective: Vital signs were reviewed and unremarkable. Vitals:   12/13/21 0800 12/13/21 1100  BP: (!) 139/92 (!) 131/101  Pulse: 66 77  Resp: 12 11  Temp: 97.6 F (36.4 C) 97.7 F (36.5 C)  SpO2: 96% 98%    Intake/Output Summary (Last 24 hours) at 12/13/2021 1656 Last data filed at 12/13/2021 1235 Gross per 24 hour  Intake 5342.99 ml  Output 2575 ml  Net 2767.99 ml   Filed Weights   12/10/21 1757  Weight: 105.7 kg  Body mass index is 30.32 kg/m.  Exam:  General: Alert and oriented x2, no acute distress HEENT: Normocephalic and atraumatic, mucous membranes are moist Cardiovascular: Regular rate and rhythm, S1-S2, 2 out of 6 systolic ejection murmur Respiratory: Clear to auscultation bilaterally Abdomen: Soft, nontender, nondistended, positive bowel  sounds Musculoskeletal: No clubbing or cyanosis, trace pitting edema Skin: No skin breaks, tears or lesions Psychiatry: Appropriate, no evidence of psychoses Neurology: No focal deficits  Data Reviewed: Creatinine with mild increase from previous day at 2.7  Disposition:  Status is: Inpatient Remains inpatient appropriate because: Planned cardiac intervention    Anticipated discharge date: 7/9  Family Communication: Spoke to brother by phone DVT Prophylaxis:   Heparin infusion    Author: Annita Brod ,MD 12/13/2021 4:56 PM  To reach On-call, see care teams to locate the attending and reach out via [email or web address]. Between 7PM-7AM, please contact night-coverage If you still have difficulty reaching the attending provider, please page the [facility] (Director on Call) for [facility] on amion for assistance.

## 2021-12-13 NOTE — H&P (View-Only) (Signed)
Cardiology Progress Note   Patient Name: Shane Holloway Date of Encounter: 12/13/2021  Primary Cardiologist: [facility]  Subjective   Patient resting comfortably in bed this morning with brother and niece at bedside.  States he is feeling well s/p Left heart catheterization yeserday.  Denies chest pain and shortness of breath.  Family @ bedside.  Inpatient Medications    Scheduled Meds:  amiodarone  200 mg Oral Daily   aspirin EC  81 mg Oral Daily   atorvastatin  80 mg Oral Daily   carvedilol  12.5 mg Oral BID WC   cholecalciferol  5,000 Units Oral Daily   clopidogrel  75 mg Oral Q breakfast   cyclobenzaprine  5 mg Oral BID   empagliflozin  10 mg Oral Daily   finasteride  5 mg Oral Daily   glipiZIDE  10 mg Oral Daily   insulin aspart  0-9 Units Subcutaneous TID WC   isosorbide mononitrate  30 mg Oral Daily   sodium chloride flush  3 mL Intravenous Q12H   tamsulosin  0.4 mg Oral Daily   Continuous Infusions:  sodium chloride     heparin 1,600 Units/hr (12/13/21 0910)   nitroGLYCERIN Stopped (12/12/21 2205)   PRN Meds: sodium chloride, acetaminophen, alum & mag hydroxide-simeth, benzonatate, hydrALAZINE, LORazepam, menthol-cetylpyridinium, morphine injection, nitroGLYCERIN, ondansetron (ZOFRAN) IV, sodium chloride flush   Vital Signs    Vitals:   12/13/21 0000 12/13/21 0406 12/13/21 0700 12/13/21 0800  BP: 130/86 118/67 (!) 149/95 (!) 139/92  Pulse: 72 68 64 66  Resp: '19 16 17 12  '$ Temp:  99.1 F (37.3 C)  97.6 F (36.4 C)  TempSrc:  Oral  Oral  SpO2: 94% 97% 96% 96%  Weight:      Height:        Intake/Output Summary (Last 24 hours) at 12/13/2021 1021 Last data filed at 12/13/2021 0818 Gross per 24 hour  Intake 5342.99 ml  Output 2275 ml  Net 3067.99 ml   Filed Weights   12/10/21 1757  Weight: 105.7 kg    Physical Exam   GEN: Well nourished, well developed, in no acute distress.  HEENT: Grossly normal.  Neck: Supple, no JVD, carotid bruits, or  masses. Cardiac: RRR, II/VI systolic murmur throughout, no rubs, or gallops. No clubbing, cyanosis, edema.  Radials 2+, DP/PT 2+ and equal bilaterally.  R radial cath site w/o bleeding/bruit/hematoma. Respiratory:  Respirations regular and unlabored, clear to auscultation bilaterally. GI: Soft, nontender, nondistended, BS + x 4. MS: no deformity or atrophy. Skin: warm and dry, no rash. Neuro:  Strength and sensation are intact. Psych: AAOx3.  +++mild cognitive impairment. Normal affect.  Labs    Chemistry Recent Labs  Lab 12/11/21 0528 12/12/21 0539 12/13/21 0533  NA 142 140 139  K 3.4* 3.4* 3.1*  CL 109 108 108  CO2 '25 24 24  '$ GLUCOSE 182* 211* 171*  BUN 26* 25* 26*  CREATININE 2.53* 2.51* 2.70*  CALCIUM 8.9 8.7* 8.2*  GFRNONAA 26* 26* 24*  ANIONGAP '8 8 7     '$ Hematology Recent Labs  Lab 12/11/21 0528 12/12/21 0539 12/13/21 0533  WBC 7.0 8.7 6.7  RBC 5.20 4.86 4.53  HGB 15.0 13.9 12.9*  HCT 44.1 41.3 38.7*  MCV 84.8 85.0 85.4  MCH 28.8 28.6 28.5  MCHC 34.0 33.7 33.3  RDW 13.6 13.5 13.7  PLT 122* 107* 102*    Cardiac Enzymes  Recent Labs  Lab 12/10/21 1754 12/10/21 2105 12/11/21 0922  TROPONINIHS 51* 919*  15,109*      BNP    Component Value Date/Time   BNP 288.3 (H) 12/11/2021 2233   Lipids  Lab Results  Component Value Date   CHOL 171 12/11/2021   HDL 28 (L) 12/11/2021   LDLCALC UNABLE TO CALCULATE IF TRIGLYCERIDE OVER 400 mg/dL 12/11/2021   LDLDIRECT 58.1 12/11/2021   TRIG 509 (H) 12/11/2021   CHOLHDL 6.1 12/11/2021    HbA1c  Lab Results  Component Value Date   HGBA1C 8.9 (H) 12/11/2021    Radiology    -------------  Telemetry    NSR, rates in the 60s - Personally Reviewed  ECG    No new readings - Personally Reviewed  Cardiac Studies  Cardiac Catheterization 01-03-2022:  Diagnostic Dominance: Right  Severe native coronary artery disease, as detailed below. Widely patent LIMA-LAD. Patent SVG Y-graft to OM1 and D1.  There is  mild diffuse disease involving the segment to OM1.  There is a focal 90% stenosis in the segment to D1 that most likely represents the culprit for the patient's NSTEMI. Patent SVG-rPDA with diffuse graft ectasia and 60% tubular stenosis in the proximal/mid graft. Upper normal left and right heart filling pressures. Discordant Fick and thermodilution cardiac output (Fick CO/CI 4.7/2.1; thermodilution CO/CI 3.1/1.3).  Question significant TR contributing to discrepancy. _____________   2D echo 12/11/2021: 1. Left ventricular ejection fraction, by estimation, is 35 to 40%. The  left ventricle has moderately decreased function. The left ventricle has  no regional wall motion abnormalities. There is mild left ventricular  hypertrophy. Left ventricular  diastolic parameters are consistent with Grade I diastolic dysfunction  (impaired relaxation).   2. Right ventricular systolic function is normal. The right ventricular  size is normal. Tricuspid regurgitation signal is inadequate for assessing  PA pressure.   3. The mitral valve is normal in structure. No evidence of mitral valve  regurgitation. No evidence of mitral stenosis.   4. The aortic valve was not well visualized. Aortic valve regurgitation  is not visualized. No aortic stenosis is present.   5. The inferior vena cava is normal in size with greater than 50%  respiratory variability, suggesting right atrial pressure of 3 mmHg.  Patient Profile     71 y.o. male with history of CAD status post three-vessel CABG in 2009 with redo two-vessel CABG in 2015 at [facility] with SVG to OM and SVG to RCA, aortic root repair, aortic stenosis status post bioprosthetic AVR in 2015 at [facility], refractory SVT, embolic CVA, CKD stage III, cognitive impairment, left upper extremity DVT on apixaban, DM2, HTN, HLD, and BPH.  He is admitted and being treated this stay for NSTEMI.  Assessment & Plan    1.  NSTEMI/CAD : patient has history of CAD/CABG with redo in 2015.   He presented 7/3 w/ chest pain and NSTEMI.  S/p left heart catheterization yesterday 04-Jan-2023  severe native coronary artery multivessel disease w/ significant graft dzs in Y graft to D1 - presumed to be culprit of NSTEMI.  No c/p this AM.  Femoral site is clean, dry, intact. He is currently chest pain free. Continues on heparin gtt, nitro gtt d/c yesterday.   Continue aspirin, lipitor, and coreg. Started Plavix 75 mg in preparation for PCI. Creat up slightly this AM to 2.7.  Will plan on staged PCI tomorrow if creatinine stable.  Gentle hydration beginning tonight.  2.  Acute HRrEF/Ischemic Cardiomyopathy:  EF prev nl in 01/2021.  35-40% on echo this admission.  Euvolemic on exam.  Continue coreg and Jardiance.  Holding PTA lasix d/t elevated creatinine. Restart PTA Losartan 100 mg daily when renal fxn allows and consider transition to entresto.  3.  PSVT: currently in NSR on telemetry.  Continue amiodarone and coreg.  4.  CKD stage III:  Overall, creat has been relatively stable, though up this AM following cath yesterday (2.51  2.7).  Home doses of lasix and ARB on hold. Continue to monitor creatinine, plan for staged PCI when stable.  Will gently hydrate tonight in prep for possible PCI tomorrow.  5.  HTN: stable, managed on current medications. Plan to resume home dose of losartan vs transitioning to entresto once creat stable.  6.  Hx of DVT: restart eliquis once invasive procedures completed, continue heparin gtt.  7.  Mild cognitive impairment: short term memory, niece and brother at beside.  8.  DM2: A1C 8.9 on 12/11/21.  Currently on glipizide, insulin aspart with meals, and Jardiance.  Management per IM team.   Signed, Murray Hodgkins, NP  12/13/2021, 10:21 AM    For questions or updates, please contact   Please consult [email or web address] for contact info under Cardiology/STEMI.

## 2021-12-13 NOTE — Progress Notes (Signed)
Cardiology Progress Note   Patient Name: Shane Holloway Date of Encounter: 12/13/2021  Primary Cardiologist: [facility]  Subjective   Patient resting comfortably in bed this morning with brother and niece at bedside.  States he is feeling well s/p Left heart catheterization yeserday.  Denies chest pain and shortness of breath.  Family @ bedside.  Inpatient Medications    Scheduled Meds:  amiodarone  200 mg Oral Daily   aspirin EC  81 mg Oral Daily   atorvastatin  80 mg Oral Daily   carvedilol  12.5 mg Oral BID WC   cholecalciferol  5,000 Units Oral Daily   clopidogrel  75 mg Oral Q breakfast   cyclobenzaprine  5 mg Oral BID   empagliflozin  10 mg Oral Daily   finasteride  5 mg Oral Daily   glipiZIDE  10 mg Oral Daily   insulin aspart  0-9 Units Subcutaneous TID WC   isosorbide mononitrate  30 mg Oral Daily   sodium chloride flush  3 mL Intravenous Q12H   tamsulosin  0.4 mg Oral Daily   Continuous Infusions:  sodium chloride     heparin 1,600 Units/hr (12/13/21 0910)   nitroGLYCERIN Stopped (12/12/21 2205)   PRN Meds: sodium chloride, acetaminophen, alum & mag hydroxide-simeth, benzonatate, hydrALAZINE, LORazepam, menthol-cetylpyridinium, morphine injection, nitroGLYCERIN, ondansetron (ZOFRAN) IV, sodium chloride flush   Vital Signs    Vitals:   12/13/21 0000 12/13/21 0406 12/13/21 0700 12/13/21 0800  BP: 130/86 118/67 (!) 149/95 (!) 139/92  Pulse: 72 68 64 66  Resp: '19 16 17 12  '$ Temp:  99.1 F (37.3 C)  97.6 F (36.4 C)  TempSrc:  Oral  Oral  SpO2: 94% 97% 96% 96%  Weight:      Height:        Intake/Output Summary (Last 24 hours) at 12/13/2021 1021 Last data filed at 12/13/2021 0818 Gross per 24 hour  Intake 5342.99 ml  Output 2275 ml  Net 3067.99 ml   Filed Weights   12/10/21 1757  Weight: 105.7 kg    Physical Exam   GEN: Well nourished, well developed, in no acute distress.  HEENT: Grossly normal.  Neck: Supple, no JVD, carotid bruits, or  masses. Cardiac: RRR, II/VI systolic murmur throughout, no rubs, or gallops. No clubbing, cyanosis, edema.  Radials 2+, DP/PT 2+ and equal bilaterally.  R radial cath site w/o bleeding/bruit/hematoma. Respiratory:  Respirations regular and unlabored, clear to auscultation bilaterally. GI: Soft, nontender, nondistended, BS + x 4. MS: no deformity or atrophy. Skin: warm and dry, no rash. Neuro:  Strength and sensation are intact. Psych: AAOx3.  +++mild cognitive impairment. Normal affect.  Labs    Chemistry Recent Labs  Lab 12/11/21 0528 12/12/21 0539 12/13/21 0533  NA 142 140 139  K 3.4* 3.4* 3.1*  CL 109 108 108  CO2 '25 24 24  '$ GLUCOSE 182* 211* 171*  BUN 26* 25* 26*  CREATININE 2.53* 2.51* 2.70*  CALCIUM 8.9 8.7* 8.2*  GFRNONAA 26* 26* 24*  ANIONGAP '8 8 7     '$ Hematology Recent Labs  Lab 12/11/21 0528 12/12/21 0539 12/13/21 0533  WBC 7.0 8.7 6.7  RBC 5.20 4.86 4.53  HGB 15.0 13.9 12.9*  HCT 44.1 41.3 38.7*  MCV 84.8 85.0 85.4  MCH 28.8 28.6 28.5  MCHC 34.0 33.7 33.3  RDW 13.6 13.5 13.7  PLT 122* 107* 102*    Cardiac Enzymes  Recent Labs  Lab 12/10/21 1754 12/10/21 2105 12/11/21 0922  TROPONINIHS 51* 919*  15,109*      BNP    Component Value Date/Time   BNP 288.3 (H) 12/11/2021 2233   Lipids  Lab Results  Component Value Date   CHOL 171 12/11/2021   HDL 28 (L) 12/11/2021   LDLCALC UNABLE TO CALCULATE IF TRIGLYCERIDE OVER 400 mg/dL 12/11/2021   LDLDIRECT 58.1 12/11/2021   TRIG 509 (H) 12/11/2021   CHOLHDL 6.1 12/11/2021    HbA1c  Lab Results  Component Value Date   HGBA1C 8.9 (H) 12/11/2021    Radiology    -------------  Telemetry    NSR, rates in the 60s - Personally Reviewed  ECG    No new readings - Personally Reviewed  Cardiac Studies  Cardiac Catheterization 01-10-22:  Diagnostic Dominance: Right  Severe native coronary artery disease, as detailed below. Widely patent LIMA-LAD. Patent SVG Y-graft to OM1 and D1.  There is  mild diffuse disease involving the segment to OM1.  There is a focal 90% stenosis in the segment to D1 that most likely represents the culprit for the patient's NSTEMI. Patent SVG-rPDA with diffuse graft ectasia and 60% tubular stenosis in the proximal/mid graft. Upper normal left and right heart filling pressures. Discordant Fick and thermodilution cardiac output (Fick CO/CI 4.7/2.1; thermodilution CO/CI 3.1/1.3).  Question significant TR contributing to discrepancy. _____________   2D echo 12/11/2021: 1. Left ventricular ejection fraction, by estimation, is 35 to 40%. The  left ventricle has moderately decreased function. The left ventricle has  no regional wall motion abnormalities. There is mild left ventricular  hypertrophy. Left ventricular  diastolic parameters are consistent with Grade I diastolic dysfunction  (impaired relaxation).   2. Right ventricular systolic function is normal. The right ventricular  size is normal. Tricuspid regurgitation signal is inadequate for assessing  PA pressure.   3. The mitral valve is normal in structure. No evidence of mitral valve  regurgitation. No evidence of mitral stenosis.   4. The aortic valve was not well visualized. Aortic valve regurgitation  is not visualized. No aortic stenosis is present.   5. The inferior vena cava is normal in size with greater than 50%  respiratory variability, suggesting right atrial pressure of 3 mmHg.  Patient Profile     71 y.o. male with history of CAD status post three-vessel CABG in 2009 with redo two-vessel CABG in 2015 at [facility] with SVG to OM and SVG to RCA, aortic root repair, aortic stenosis status post bioprosthetic AVR in 2015 at [facility], refractory SVT, embolic CVA, CKD stage III, cognitive impairment, left upper extremity DVT on apixaban, DM2, HTN, HLD, and BPH.  He is admitted and being treated this stay for NSTEMI.  Assessment & Plan    1.  NSTEMI/CAD : patient has history of CAD/CABG with redo in 2015.   He presented 7/3 w/ chest pain and NSTEMI.  S/p left heart catheterization yesterday 2023-01-11  severe native coronary artery multivessel disease w/ significant graft dzs in Y graft to D1 - presumed to be culprit of NSTEMI.  No c/p this AM.  Femoral site is clean, dry, intact. He is currently chest pain free. Continues on heparin gtt, nitro gtt d/c yesterday.   Continue aspirin, lipitor, and coreg. Started Plavix 75 mg in preparation for PCI. Creat up slightly this AM to 2.7.  Will plan on staged PCI tomorrow if creatinine stable.  Gentle hydration beginning tonight.  2.  Acute HRrEF/Ischemic Cardiomyopathy:  EF prev nl in 01/2021.  35-40% on echo this admission.  Euvolemic on exam.  Continue coreg and Jardiance.  Holding PTA lasix d/t elevated creatinine. Restart PTA Losartan 100 mg daily when renal fxn allows and consider transition to entresto.  3.  PSVT: currently in NSR on telemetry.  Continue amiodarone and coreg.  4.  CKD stage III:  Overall, creat has been relatively stable, though up this AM following cath yesterday (2.51  2.7).  Home doses of lasix and ARB on hold. Continue to monitor creatinine, plan for staged PCI when stable.  Will gently hydrate tonight in prep for possible PCI tomorrow.  5.  HTN: stable, managed on current medications. Plan to resume home dose of losartan vs transitioning to entresto once creat stable.  6.  Hx of DVT: restart eliquis once invasive procedures completed, continue heparin gtt.  7.  Mild cognitive impairment: short term memory, niece and brother at beside.  8.  DM2: A1C 8.9 on 12/11/21.  Currently on glipizide, insulin aspart with meals, and Jardiance.  Management per IM team.   Signed, Murray Hodgkins, NP  12/13/2021, 10:21 AM    For questions or updates, please contact   Please consult [email or web address] for contact info under Cardiology/STEMI.

## 2021-12-14 ENCOUNTER — Encounter
Admission: EM | Discharge status: Against Medical Advice | Payer: Self-pay | Source: Home / Self Care | Attending: Internal Medicine

## 2021-12-14 ENCOUNTER — Encounter: Payer: Self-pay | Admitting: Cardiovascular Disease

## 2021-12-14 ENCOUNTER — Other Ambulatory Visit: Payer: Self-pay

## 2021-12-14 DIAGNOSIS — I5042 Chronic combined systolic (congestive) and diastolic (congestive) heart failure: Secondary | ICD-10-CM | POA: Diagnosis not present

## 2021-12-14 DIAGNOSIS — E1165 Type 2 diabetes mellitus with hyperglycemia: Secondary | ICD-10-CM | POA: Diagnosis not present

## 2021-12-14 DIAGNOSIS — I214 Non-ST elevation (NSTEMI) myocardial infarction: Secondary | ICD-10-CM | POA: Diagnosis not present

## 2021-12-14 DIAGNOSIS — F0150 Vascular dementia without behavioral disturbance: Secondary | ICD-10-CM | POA: Diagnosis not present

## 2021-12-14 HISTORY — PX: CORONARY STENT INTERVENTION: CATH118234

## 2021-12-14 HISTORY — PX: INTRAVASCULAR ULTRASOUND/IVUS: CATH118244

## 2021-12-14 LAB — HEPARIN LEVEL (UNFRACTIONATED): Heparin Unfractionated: 0.51 IU/mL (ref 0.30–0.70)

## 2021-12-14 LAB — POCT ACTIVATED CLOTTING TIME: Activated Clotting Time: 323 seconds

## 2021-12-14 LAB — CBC
HCT: 37.9 % — ABNORMAL LOW (ref 39.0–52.0)
Hemoglobin: 12.7 g/dL — ABNORMAL LOW (ref 13.0–17.0)
MCH: 28.5 pg (ref 26.0–34.0)
MCHC: 33.5 g/dL (ref 30.0–36.0)
MCV: 85 fL (ref 80.0–100.0)
Platelets: 94 10*3/uL — ABNORMAL LOW (ref 150–400)
RBC: 4.46 MIL/uL (ref 4.22–5.81)
RDW: 13.3 % (ref 11.5–15.5)
WBC: 5.6 10*3/uL (ref 4.0–10.5)
nRBC: 0 % (ref 0.0–0.2)

## 2021-12-14 LAB — BASIC METABOLIC PANEL
Anion gap: 6 (ref 5–15)
BUN: 28 mg/dL — ABNORMAL HIGH (ref 8–23)
CO2: 21 mmol/L — ABNORMAL LOW (ref 22–32)
Calcium: 8.4 mg/dL — ABNORMAL LOW (ref 8.9–10.3)
Chloride: 110 mmol/L (ref 98–111)
Creatinine, Ser: 2.79 mg/dL — ABNORMAL HIGH (ref 0.61–1.24)
GFR, Estimated: 23 mL/min — ABNORMAL LOW (ref >60)
Glucose, Bld: 206 mg/dL — ABNORMAL HIGH (ref 70–99)
Potassium: 3.6 mmol/L (ref 3.5–5.1)
Sodium: 137 mmol/L (ref 135–145)

## 2021-12-14 LAB — GLUCOSE, CAPILLARY
Glucose-Capillary: 163 mg/dL — ABNORMAL HIGH (ref 70–99)
Glucose-Capillary: 151 mg/dL — ABNORMAL HIGH (ref 70–99)
Glucose-Capillary: 178 mg/dL — ABNORMAL HIGH (ref 70–99)

## 2021-12-14 SURGERY — CORONARY STENT INTERVENTION
Anesthesia: Moderate Sedation

## 2021-12-14 MED ORDER — CLOPIDOGREL BISULFATE 300 MG PO TABS
300.0000 mg | ORAL_TABLET | Freq: Once | ORAL | Status: AC
Start: 2021-12-14 — End: 2021-12-14
  Administered 2021-12-14: 300 mg via ORAL

## 2021-12-14 MED ORDER — SODIUM CHLORIDE 0.9% FLUSH: 3.0000 mL | INTRAVENOUS | Status: DC | PRN

## 2021-12-14 MED ORDER — HEPARIN SODIUM (PORCINE) 1000 UNIT/ML IJ SOLN
INTRAMUSCULAR | Status: DC | PRN
  Administered 2021-12-14: 10000 [IU] via INTRAVENOUS

## 2021-12-14 MED ORDER — INSULIN ASPART 100 UNIT/ML IJ SOLN
0.0000 [IU] | Freq: Three times a day (TID) | INTRAMUSCULAR | Status: DC
  Administered 2021-12-14: 3 [IU] via SUBCUTANEOUS
  Administered 2021-12-15: 2 [IU] via SUBCUTANEOUS
  Administered 2021-12-15: 5 [IU] via SUBCUTANEOUS
  Administered 2021-12-15 – 2021-12-16 (×2): 3 [IU] via SUBCUTANEOUS
  Administered 2021-12-17: 5 [IU] via SUBCUTANEOUS
  Administered 2021-12-17 (×2): 8 [IU] via SUBCUTANEOUS
  Administered 2021-12-18: 11 [IU] via SUBCUTANEOUS
  Administered 2021-12-18 (×2): 8 [IU] via SUBCUTANEOUS
  Administered 2021-12-19 (×2): 11 [IU] via SUBCUTANEOUS
  Administered 2021-12-19: 8 [IU] via SUBCUTANEOUS
  Administered 2021-12-20: 15 [IU] via SUBCUTANEOUS
  Administered 2021-12-20: 8 [IU] via SUBCUTANEOUS
  Administered 2021-12-20: 15 [IU] via SUBCUTANEOUS
  Filled 2021-12-14 (×17): qty 1

## 2021-12-14 MED ORDER — LIDOCAINE HCL (PF) 1 % IJ SOLN
INTRAMUSCULAR | Status: DC | PRN
  Administered 2021-12-14: 5 mL

## 2021-12-14 MED ORDER — VERAPAMIL HCL 2.5 MG/ML IV SOLN
INTRAVENOUS | Status: AC
  Filled 2021-12-14: qty 2

## 2021-12-14 MED ORDER — CLOPIDOGREL BISULFATE 75 MG PO TABS
ORAL_TABLET | ORAL | Status: DC | PRN
  Administered 2021-12-14: 75 mg via ORAL

## 2021-12-14 MED ORDER — SODIUM CHLORIDE 0.9% FLUSH
3.0000 mL | Freq: Two times a day (BID) | INTRAVENOUS | Status: DC
Start: 2021-12-14 — End: 2021-12-24
  Administered 2021-12-14 – 2021-12-24 (×18): 3 mL via INTRAVENOUS

## 2021-12-14 MED ORDER — HEPARIN (PORCINE) IN NACL 1000-0.9 UT/500ML-% IV SOLN
INTRAVENOUS | Status: DC | PRN
  Administered 2021-12-14: 1000 mL

## 2021-12-14 MED ORDER — LIDOCAINE HCL 1 % IJ SOLN
INTRAMUSCULAR | Status: AC
  Filled 2021-12-14: qty 20

## 2021-12-14 MED ORDER — APIXABAN 5 MG PO TABS
5.0000 mg | ORAL_TABLET | Freq: Two times a day (BID) | ORAL | Status: DC
Start: 2021-12-14 — End: 2021-12-24
  Administered 2021-12-14 – 2021-12-24 (×20): 5 mg via ORAL
  Filled 2021-12-14 (×20): qty 1

## 2021-12-14 MED ORDER — SODIUM CHLORIDE 0.9 % IV SOLN
250.0000 mL | INTRAVENOUS | Status: DC | PRN
  Administered 2021-12-17 – 2021-12-18 (×2): 250 mL via INTRAVENOUS

## 2021-12-14 MED ORDER — HEPARIN (PORCINE) IN NACL 1000-0.9 UT/500ML-% IV SOLN
INTRAVENOUS | Status: AC
  Filled 2021-12-14: qty 1500

## 2021-12-14 MED ORDER — ASPIRIN 81 MG PO CHEW
CHEWABLE_TABLET | ORAL | Status: DC | PRN
  Administered 2021-12-14: 81 mg via ORAL

## 2021-12-14 MED ORDER — CLOPIDOGREL BISULFATE 75 MG PO TABS
ORAL_TABLET | ORAL | Status: AC
  Administered 2021-12-15: 75 mg via ORAL
  Filled 2021-12-14: qty 4

## 2021-12-14 MED ORDER — ASPIRIN 81 MG PO CHEW
CHEWABLE_TABLET | ORAL | Status: AC
  Filled 2021-12-14: qty 1

## 2021-12-14 MED ORDER — INSULIN ASPART 100 UNIT/ML IJ SOLN
0.0000 [IU] | Freq: Every day | INTRAMUSCULAR | Status: DC
  Administered 2021-12-16 – 2021-12-17 (×2): 3 [IU] via SUBCUTANEOUS
  Administered 2021-12-18: 5 [IU] via SUBCUTANEOUS
  Administered 2021-12-19: 4 [IU] via SUBCUTANEOUS
  Administered 2021-12-20: 5 [IU] via SUBCUTANEOUS
  Filled 2021-12-14 (×5): qty 1

## 2021-12-14 MED ORDER — SODIUM CHLORIDE 0.9 % IV SOLN: INTRAVENOUS | Status: AC

## 2021-12-14 MED ORDER — VERAPAMIL HCL 2.5 MG/ML IV SOLN
INTRAVENOUS | Status: DC | PRN
  Administered 2021-12-14: 2.5 mg via INTRA_ARTERIAL

## 2021-12-14 MED ORDER — IOHEXOL 300 MG/ML  SOLN
INTRAMUSCULAR | Status: DC | PRN
  Administered 2021-12-14: 25 mL

## 2021-12-14 MED ORDER — CLOPIDOGREL BISULFATE 75 MG PO TABS
ORAL_TABLET | ORAL | Status: AC
  Filled 2021-12-14: qty 1

## 2021-12-14 MED ORDER — HEPARIN SODIUM (PORCINE) 1000 UNIT/ML IJ SOLN
INTRAMUSCULAR | Status: AC
  Filled 2021-12-14: qty 10

## 2021-12-14 SURGICAL SUPPLY — 23 items
BAND CMPR LRG ZPHR (HEMOSTASIS) ×1
BAND ZEPHYR COMPRESS 30 LONG (HEMOSTASIS) ×1 IMPLANT
CANNULA 5F STIFF (CANNULA) IMPLANT
CATH 5FR JR4 DIAGNOSTIC (CATHETERS) ×1 IMPLANT
CATH EAGLE EYE PLAT IMAGING (CATHETERS) ×1 IMPLANT
CATH VISTA GUIDE 6FR AL1 (CATHETERS) ×1 IMPLANT
CATH VISTA GUIDE 6FR LCB (CATHETERS) ×1 IMPLANT
DRAPE BRACHIAL (DRAPES) ×1 IMPLANT
GLIDESHEATH SLEND SS 6F .021 (SHEATH) ×1 IMPLANT
GUIDEWIRE INQWIRE 1.5J.035X260 (WIRE) IMPLANT
INQWIRE 1.5J .035X260CM (WIRE) ×2
KIT ENCORE 26 ADVANTAGE (KITS) ×1 IMPLANT
KIT SYRINGE INJ CVI SPIKEX1 (MISCELLANEOUS) ×1 IMPLANT
PACK CARDIAC CATH (CUSTOM PROCEDURE TRAY) ×3 IMPLANT
PROTECTION STATION PRESSURIZED (MISCELLANEOUS) ×2
SET ATX SIMPLICITY (MISCELLANEOUS) ×1 IMPLANT
SHEATH AVANTI 6FR X 11CM (SHEATH) IMPLANT
STATION PROTECTION PRESSURIZED (MISCELLANEOUS) IMPLANT
STENT ONYX FRONTIER 3.5X18 (Permanent Stent) ×1 IMPLANT
TUBING CIL FLEX 10 FLL-RA (TUBING) ×1 IMPLANT
WIRE GUIDERIGHT .035X150 (WIRE) IMPLANT
WIRE HITORQ VERSACORE ST 145CM (WIRE) ×1 IMPLANT
WIRE RUNTHROUGH .014X180CM (WIRE) ×2 IMPLANT

## 2021-12-14 NOTE — Progress Notes (Signed)
Cardiology Progress Note   Patient Name: Shane Holloway Date of Encounter: 12/14/2021  Primary Cardiologist: [facility]  Subjective   I proceeded with IVUS guided PCI of SVG to diagonal this morning via the right radial artery.  The patient is feeling well with no chest pain or shortness of breath.  25 mL of contrast was used for the procedure.  Inpatient Medications    Scheduled Meds:  [MAR Hold] amiodarone  200 mg Oral Daily   apixaban  5 mg Oral BID   [MAR Hold] aspirin EC  81 mg Oral Daily   [MAR Hold] atorvastatin  80 mg Oral Daily   [MAR Hold] carvedilol  12.5 mg Oral BID WC   [MAR Hold] cholecalciferol  5,000 Units Oral Daily   [MAR Hold] clopidogrel  75 mg Oral Q breakfast   [MAR Hold] cyclobenzaprine  5 mg Oral BID   [MAR Hold] empagliflozin  10 mg Oral Daily   [MAR Hold] fenofibrate  54 mg Oral Daily   [MAR Hold] finasteride  5 mg Oral Daily   [MAR Hold] glipiZIDE  10 mg Oral Daily   [MAR Hold] insulin aspart  0-9 Units Subcutaneous TID WC   [MAR Hold] isosorbide mononitrate  30 mg Oral BID   [MAR Hold] omega-3 acid ethyl esters  2 g Oral BID   [MAR Hold] sodium chloride flush  3 mL Intravenous Q12H   sodium chloride flush  3 mL Intravenous Q12H   [MAR Hold] tamsulosin  0.4 mg Oral Daily   Continuous Infusions:  [MAR Hold] sodium chloride     sodium chloride     sodium chloride     PRN Meds: [MAR Hold] sodium chloride, sodium chloride, [MAR Hold] acetaminophen, [MAR Hold] alum & mag hydroxide-simeth, aspirin, [MAR Hold] benzonatate, clopidogrel, Heparin (Porcine) in NaCl, heparin sodium (porcine), [MAR Hold] hydrALAZINE, iohexol, lidocaine (PF), [MAR Hold] LORazepam, [MAR Hold] menthol-cetylpyridinium, [MAR Hold]  morphine injection, [MAR Hold] nitroGLYCERIN, [MAR Hold] ondansetron (ZOFRAN) IV, sodium chloride flush, verapamil   Vital Signs    Vitals:   12/14/21 0915 12/14/21 0930 12/14/21 0945 12/14/21 1000  BP: (!) 143/82 (!) 146/87 (!) 137/98 123/70  Pulse: 86  81 80 81  Resp: 16 (!) 25 (!) 23 (!) 22  Temp:      TempSrc:      SpO2: 93% 95% (!) 86% 93%  Weight:      Height:        Intake/Output Summary (Last 24 hours) at 12/14/2021 1008 Last data filed at 12/14/2021 0743 Gross per 24 hour  Intake 509.43 ml  Output 1725 ml  Net -1215.57 ml    Filed Weights   12/10/21 1757  Weight: 105.7 kg    Physical Exam   GEN: Well nourished, well developed, in no acute distress.  HEENT: Grossly normal.  Neck: Supple, no JVD, carotid bruits, or masses. Cardiac: RRR, II/VI systolic murmur throughout, no rubs, or gallops. No clubbing, cyanosis, edema.  Radials 2+, DP/PT 2+ and equal bilaterally.  R radial cath site w/o bleeding/bruit/hematoma. Respiratory:  Respirations regular and unlabored, clear to auscultation bilaterally. GI: Soft, nontender, nondistended, BS + x 4. MS: no deformity or atrophy. Skin: warm and dry, no rash. Neuro:  Strength and sensation are intact. Psych: AAOx3.  +++mild cognitive impairment. Normal affect.  Labs    Chemistry Recent Labs  Lab 12/12/21 0539 12/13/21 0533 12/14/21 0128  NA 140 139 137  K 3.4* 3.1* 3.6  CL 108 108 110  CO2 24 24  21*  GLUCOSE 211* 171* 206*  BUN 25* 26* 28*  CREATININE 2.51* 2.70* 2.79*  CALCIUM 8.7* 8.2* 8.4*  GFRNONAA 26* 24* 23*  ANIONGAP '8 7 6      '$ Hematology Recent Labs  Lab 2022-01-11 0539 12/13/21 0533 12/14/21 0128  WBC 8.7 6.7 5.6  RBC 4.86 4.53 4.46  HGB 13.9 12.9* 12.7*  HCT 41.3 38.7* 37.9*  MCV 85.0 85.4 85.0  MCH 28.6 28.5 28.5  MCHC 33.7 33.3 33.5  RDW 13.5 13.7 13.3  PLT 107* 102* 94*     Cardiac Enzymes  Recent Labs  Lab 12/10/21 1754 12/10/21 2105 12/11/21 0922  TROPONINIHS 51* 919* 15,109*       BNP    Component Value Date/Time   BNP 288.3 (H) 12/11/2021 2233   Lipids  Lab Results  Component Value Date   CHOL 171 12/11/2021   HDL 28 (L) 12/11/2021   LDLCALC UNABLE TO CALCULATE IF TRIGLYCERIDE OVER 400 mg/dL 12/11/2021   LDLDIRECT  58.1 12/11/2021   TRIG 509 (H) 12/11/2021   CHOLHDL 6.1 12/11/2021    HbA1c  Lab Results  Component Value Date   HGBA1C 8.9 (H) 12/11/2021    Radiology    -------------  Telemetry    NSR, rates in the 60s - Personally Reviewed  ECG    No new readings - Personally Reviewed  Cardiac Studies  Cardiac Catheterization 01-11-22:  Diagnostic Dominance: Right  Severe native coronary artery disease, as detailed below. Widely patent LIMA-LAD. Patent SVG Y-graft to OM1 and D1.  There is mild diffuse disease involving the segment to OM1.  There is a focal 90% stenosis in the segment to D1 that most likely represents the culprit for the patient's NSTEMI. Patent SVG-rPDA with diffuse graft ectasia and 60% tubular stenosis in the proximal/mid graft. Upper normal left and right heart filling pressures. Discordant Fick and thermodilution cardiac output (Fick CO/CI 4.7/2.1; thermodilution CO/CI 3.1/1.3).  Question significant TR contributing to discrepancy. _____________   2D echo 12/11/2021: 1. Left ventricular ejection fraction, by estimation, is 35 to 40%. The  left ventricle has moderately decreased function. The left ventricle has  no regional wall motion abnormalities. There is mild left ventricular  hypertrophy. Left ventricular  diastolic parameters are consistent with Grade I diastolic dysfunction  (impaired relaxation).   2. Right ventricular systolic function is normal. The right ventricular  size is normal. Tricuspid regurgitation signal is inadequate for assessing  PA pressure.   3. The mitral valve is normal in structure. No evidence of mitral valve  regurgitation. No evidence of mitral stenosis.   4. The aortic valve was not well visualized. Aortic valve regurgitation  is not visualized. No aortic stenosis is present.   5. The inferior vena cava is normal in size with greater than 50%  respiratory variability, suggesting right atrial pressure of 3 mmHg.  Patient Profile      71 y.o. male with history of CAD status post three-vessel CABG in 2009 with redo two-vessel CABG in 2015 at [facility] with SVG to OM and SVG to RCA, aortic root repair, aortic stenosis status post bioprosthetic AVR in 2015 at [facility], refractory SVT, embolic CVA, CKD stage III, cognitive impairment, left upper extremity DVT on apixaban, DM2, HTN, HLD, and BPH.  He is admitted and being treated this stay for NSTEMI.  Assessment & Plan    1.  NSTEMI/CAD : patient has history of CAD/CABG with redo in 2015.  He presented 7/3 w/ chest pain and NSTEMI.  Status post PCI and drug-eluting stent placement to SVG to diagonal today. Continue aspirin, lipitor, and coreg.  Continue clopidogrel which should be used for 1 year. We will plan on stopping aspirin tomorrow before hospital discharge to minimize the risk of bleeding on triple therapy given that she is on anticoagulation.  2.  Acute HRrEF/Ischemic Cardiomyopathy:  EF prev nl in 01/2021.  35-40% on echo this admission.  Euvolemic on exam.  Continue coreg and Jardiance.  Holding PTA lasix d/t elevated creatinine. Restart PTA Losartan 100 mg daily when renal fxn allows and consider transition to entresto.  3.  PSVT: currently in NSR on telemetry.  Continue amiodarone and coreg.  4.  CKD stage III:  Overall, creat has been relatively stable, though up this AM following cath yesterday (2.51  2.7).  25 mL of contrast was used for the procedure today.  We will continue with gentle hydration.  5.  HTN: stable, managed on current medications. Plan to resume home dose of losartan vs transitioning to entresto once creat stable.  6.  Hx of DVT: No need to resume heparin drip.  Will resume Eliquis in the evening.  7.  Mild cognitive impairment: short term memory, niece and brother at beside.  8.  DM2: A1C 8.9 on 12/11/21.  Currently on glipizide, insulin aspart with meals, and Jardiance.  Management per IM team.   Signed, Kathlyn Sacramento, MD  12/14/2021, 10:08 AM     For questions or updates, please contact   Please consult [email or web address] for contact info under Cardiology/STEMI.

## 2021-12-14 NOTE — Inpatient Diabetes Management (Signed)
Inpatient Diabetes Program Recommendations  AACE/ADA: New Consensus Statement on Inpatient Glycemic Control   Target Ranges:  Prepandial:   less than 140 mg/dL      Peak postprandial:   less than 180 mg/dL (1-2 hours)      Critically ill patients:  140 - 180 mg/dL    Latest Reference Range & Units 12/14/21 01:28  Glucose 70 - 99 mg/dL 206 (H)    Latest Reference Range & Units 12/13/21 08:02 12/13/21 11:25 12/13/21 16:24 12/13/21 20:16  Glucose-Capillary 70 - 99 mg/dL 145 (H) 161 (H) 220 (H) 214 (H)   Review of Glycemic Control  Diabetes history: DM2 Outpatient Diabetes medications: Glipizide 10 mg daily, Jardiance 10 mg daily Current orders for Inpatient glycemic control: Glipizide 10 mg daily, Jardiance 10 mg daily, Novolog 0-9 units TID with meals  Inpatient Diabetes Program Recommendations:    Insulin: Please consider increasing Novolog correction to 0-15 units TID with meals and adding Novolog 0-5 units QHS for bedtime correction.  Thanks, Barnie Alderman, RN, MSN, [facility] Coordinator Inpatient Diabetes Program [phone] (Team Pager from 8am to [place])

## 2021-12-14 NOTE — Progress Notes (Signed)
[facility] Progress Note  Patient: Shane Holloway    ZOX:[id number]  DOA: 12/10/2021    Date of Service: the patient was seen and examined on 12/14/2021  Brief hospital course: 71 year old male with past medical history of BPH, CHF, CKD, vascular dementia, CAD status post CABG and aortic valve replacement with bioprosthetic valve on Eliquis who presented to the emergency room on 7/3 with complaints of chest pain and found to have a non-STEMI as well as hypertensive urgency.  Patient was admitted to the hospitalist service and cardiology was consulted.  Eliquis stopped and started on heparin.  Echocardiogram done noted ejection fraction of 35% and grade 1 diastolic dysfunction.  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  Patient underwent interventional catheterization on 7/7 with cardiology doing an IV ultrasound-guided PCI of the SVG to the diagonal by right radial artery.  Assessment and Plan: Assessment and Plan: * NSTEMI (non-ST elevated myocardial infarction) ([place]) IV heparin and IV nitroglycerin plus high-dose statin therapy plus aspirin.    Appreciate cardiology intervention.  Status post cardiac catheterization noting multivessel disease including diseased to graft and then interventional catheterization with PCI and drug-eluting stent placement to SVG to diagonal.  Continue Plavix, Lipitor and Coreg.  Stop aspirin 7/8 given that patient already on Eliquis.  Hypertensive urgency - We will continue his Cozaar and place him on as needed IV hydralazine. - He is getting IV nitroglycerin drip.  Started on beta-blocker and now Imdur.  Blood pressures improved.  Chronic combined systolic (congestive) and diastolic (congestive) heart failure (HCC) Echocardiogram done 7/4 notes ejection fraction 35% and grade 1 diastolic dysfunction.  BNP mildly elevated at 288.  Diuresed prior to  cardiac catheterization.  Now appears euvolemic on some gentle IV fluids, post cath  Vascular dementia without behavioral disturbance [facility]) Patient not on any home medications for this.  At one point, following admission, he became more agitated and removed his IV in the emergency room and tried to leave.  We were able to contact his brother who convinced him to stay and patient is more calm.  Has been more calm since.  As needed Ativan on board.  Chronic kidney disease, stage 3b ([place]) Repeat lab work in the morning.  Currently at baseline.  S/P aortic valve replacement with bioprosthetic valve Previously had been on heparin.  Changed back to Eliquis after interventional catheterization.  Uncontrolled type 2 diabetes mellitus with hyperglycemia, without long-term current use of insulin (HCC) We will continue Glucotrol XL and Jardiance.  A1c of 8.9.  Diabetes coordinator following these coordinator following.  Increase sliding scale coverage.  Benign prostatic hyperplasia - We will continue Flomax.  Obesity (BMI 30-39.9) Meets criteria BMI greater than 30       Body mass index is 30.32 kg/m.        Consultants: Cardiology  Procedures: Echocardiogram noting grade 1 diastolic dysfunction and ejection fraction of 35% Heart catheterization 7/5 Planned interventional heart catheterization 7/7  Antimicrobials: None  Code Status: Full code   Subjective: Patient doing well.  Denies any chest pain or shortness of breath.  Objective: Vital signs were reviewed and unremarkable. Vitals:   12/14/21 1115 12/14/21 1236  BP: 132/85 (!) 151/90  Pulse:  80  Resp: (!) 26 17  Temp:  98.4 F (36.9 C)  SpO2:  97%    Intake/Output Summary (Last 24 hours) at 12/14/2021 1417 Last data filed  at 12/14/2021 1415 Gross per 24 hour  Intake 749.43 ml  Output 1850 ml  Net -1100.57 ml    Filed Weights   12/10/21 1757  Weight: 105.7 kg   Body mass index is 30.32  kg/m.  Exam:  General: Alert and oriented x2, no acute distress HEENT: Normocephalic and atraumatic, mucous membranes are moist Cardiovascular: Regular rate and rhythm, S1-S2, 2 out of 6 systolic ejection murmur Respiratory: Clear to auscultation bilaterally Abdomen: Soft, nontender, nondistended, positive bowel sounds Musculoskeletal: No clubbing or cyanosis, trace pitting edema Skin: No skin breaks, tears or lesions Psychiatry: Appropriate, no evidence of psychoses Neurology: No focal deficits  Data Reviewed: Creatinine today at 2.79 with GFR of 23.  Disposition:  Status is: Inpatient Remains inpatient appropriate because: Cardiology clearance    Anticipated discharge date: 7/9  Family Communication: Spoke to brother by phone DVT Prophylaxis:   Heparin infusion apixaban Arne [place]) tablet 5 mg    Author: Annita Brod ,MD 12/14/2021 2:17 PM  To reach On-call, see care teams to locate the attending and reach out via [email or web address]. Between 7PM-7AM, please contact night-coverage If you still have difficulty reaching the attending provider, please page the [facility] (Director on Call) for [facility] on amion for assistance.

## 2021-12-14 NOTE — Interval H&P Note (Signed)
History and Physical Interval Note:  12/14/2021 7:57 AM  Shane Holloway  has presented today for surgery, with the diagnosis of nstemi/CAD.  The various methods of treatment have been discussed with the patient and family. After consideration of risks, benefits and other options for treatment, the patient has consented to  Procedure(s): LEFT HEART CATH AND CORS/GRAFTS ANGIOGRAPHY (N/A) as a surgical intervention.  The patient's history has been reviewed, patient examined, no change in status, stable for surgery.  I have reviewed the patient's chart and labs.  Questions were answered to the patient's satisfaction.     Kathlyn [place]

## 2021-12-14 NOTE — Progress Notes (Signed)
ANTICOAGULATION CONSULT NOTE -   Pharmacy Consult for Heparin  Indication: ACS/NSTEMI   Allergies  Allergen Reactions   Ramipril Other (See Comments)    Cough with Altace   Shellfish Allergy Nausea And Vomiting   Sulfa Antibiotics Hives    Patient Measurements: Height: 6' 1.5" (186.7 cm) Weight: 105.7 kg (233 lb) IBW/kg (Calculated) : 81.05 Heparin Dosing Weight: 102.6 kg   Vital Signs: Temp: 98.1 F (36.7 C) (07/06 2308) BP: 126/85 (07/06 1951) Pulse Rate: 73 (07/06 1951)  Labs: Recent Labs    12/11/21 0624 12/11/21 2831 12/11/21 1455 12/12/21 0539 12/13/21 0533 12/13/21 0736 12/13/21 1653 12/14/21 0128  HGB  --   --   --  13.9 12.9*  --   --  12.7*  HCT  --   --   --  41.3 38.7*  --   --  37.9*  PLT  --   --   --  107* 102*  --   --  94*  APTT 78*  --   --   --   --   --   --   --   HEPARINUNFRC 0.65  --    < > 0.34  --  0.28* 0.30 0.51  CREATININE  --   --   --  2.51* 2.70*  --   --  2.79*  TROPONINIHS  --  15,109*  --   --   --   --   --   --    < > = values in this interval not displayed.     Estimated Creatinine Clearance: 30.3 mL/min (A) (by C-G formula based on SCr of 2.79 mg/dL (H)).   Medical History: Past Medical History:  Diagnosis Date   Allergy    Aortic stenosis    Arthritis    hands   BPH (benign prostatic hyperplasia)    CHF (congestive heart failure) (HCC)    CKD (chronic kidney disease)    Diabetes mellitus    Dyspnea    Hyperlipidemia    Hypertension    Myocardial infarction ([place]) 06/10/2002   Stroke ([place]) 09/2020   Some memory deficits   Vertigo    Wears dentures    full upper    Medications:  Medications Prior to Admission  Medication Sig Dispense Refill Last Dose   amiodarone (PACERONE) 200 MG tablet Take 1 tablet (200 mg total) by mouth daily. 30 tablet 0 12/10/2021 at 0800   amLODipine (NORVASC) 2.5 MG tablet Take 2.5 mg by mouth daily.   12/10/2021 at 0800   atorvastatin (LIPITOR) 80 MG tablet Take 80 mg by mouth  daily.   12/10/2021 at 0800   Cholecalciferol 125 MCG (5000 UT) TABS Take 1 tablet by mouth daily.   12/10/2021 at 0800   cyclobenzaprine (FLEXERIL) 5 MG tablet Take 5 mg by mouth 2 (two) times daily.   12/10/2021 at 1400   ELIQUIS 5 MG TABS tablet Take 5 mg by mouth 2 (two) times daily.   12/06/2021   finasteride (PROSCAR) 5 MG tablet Take 5 mg by mouth daily.   12/10/2021 at 0800   furosemide (LASIX) 20 MG tablet Take 1 tablet (20 mg total) by mouth daily. 30 tablet 3 12/10/2021 at 0800   glipiZIDE (GLUCOTROL XL) 10 MG 24 hr tablet Take 10 mg by mouth daily.   12/10/2021 at 0800   JARDIANCE 10 MG TABS tablet Take 10 mg by mouth daily.   12/10/2021 at 0800   losartan (COZAAR) 100 MG tablet  Take 100 mg by mouth daily.   12/10/2021 at 0800   metoprolol tartrate (LOPRESSOR) 50 MG tablet Take 1 tablet (50 mg total) by mouth 2 (two) times daily. 60 tablet 0 12/10/2021 at 1400   Tamsulosin HCl (FLOMAX) 0.4 MG CAPS TAKE ONE CAPSULE BY MOUTH EVERY DAY (Patient taking differently: Take 0.4 mg by mouth daily.) 30 capsule 2 12/10/2021 at 0800   benzonatate (TESSALON PERLES) 100 MG capsule Take 1 capsule (100 mg total) by mouth 3 (three) times daily as needed for cough. 20 capsule 0 prn at prn    Assessment: Pharmacy consulted to dose heparin in this 71 year old male admitted with NSTEMI.  Pt was on Eliquis 5 mg PO BID PTA, last dose on 6/29.    7/4 @ 0624:  HL = 0.65, aPTT 78 (both therapeutic)  7/4 @ 2233:  HL = 0.49 therapeutic X 1  7/5 @ 0539:  HL = 0.34 therapeutic  7/6 @ 0736:  HL = 0.28 subtherapeutic.  7/6 @ 1653   HL = 0.30 therapeutic 7/7 @ 0128   HL = 0.51, therapeutic X 2    Goal of Therapy:  Heparin level 0.3-0.7 units/ml aPTT 66 - 102  seconds Monitor platelets by anticoagulation protocol: Yes   Plan:  7/7:  HL @ 0128 = 0.51, therapeutic X 2 Will continue pt on current rate and recheck HL on 7/8 with AM Labs.  Orene Desanctis, PharmD Clinical Pharmacist 12/14/2021 2:28 AM

## 2021-12-15 ENCOUNTER — Inpatient Hospital Stay: Payer: Medicare Other

## 2021-12-15 DIAGNOSIS — Z955 Presence of coronary angioplasty implant and graft: Secondary | ICD-10-CM | POA: Diagnosis not present

## 2021-12-15 DIAGNOSIS — N170 Acute kidney failure with tubular necrosis: Secondary | ICD-10-CM | POA: Diagnosis not present

## 2021-12-15 DIAGNOSIS — N1832 Chronic kidney disease, stage 3b: Secondary | ICD-10-CM | POA: Diagnosis not present

## 2021-12-15 DIAGNOSIS — I214 Non-ST elevation (NSTEMI) myocardial infarction: Secondary | ICD-10-CM | POA: Diagnosis not present

## 2021-12-15 DIAGNOSIS — I160 Hypertensive urgency: Secondary | ICD-10-CM | POA: Diagnosis not present

## 2021-12-15 HISTORY — DX: Acute kidney failure with tubular necrosis: N17.0

## 2021-12-15 LAB — IRON AND TIBC
Iron: 26 ug/dL — ABNORMAL LOW (ref 45–182)
Saturation Ratios: 11 % — ABNORMAL LOW (ref 17.9–39.5)
TIBC: 231 ug/dL — ABNORMAL LOW (ref 250–450)
UIBC: 205 ug/dL

## 2021-12-15 LAB — CBC
HCT: 37.2 % — ABNORMAL LOW (ref 39.0–52.0)
Hemoglobin: 12.4 g/dL — ABNORMAL LOW (ref 13.0–17.0)
MCH: 28.6 pg (ref 26.0–34.0)
MCHC: 33.3 g/dL (ref 30.0–36.0)
MCV: 85.7 fL (ref 80.0–100.0)
Platelets: 97 10*3/uL — ABNORMAL LOW (ref 150–400)
RBC: 4.34 MIL/uL (ref 4.22–5.81)
RDW: 13.5 % (ref 11.5–15.5)
WBC: 5 10*3/uL (ref 4.0–10.5)
nRBC: 0 % (ref 0.0–0.2)

## 2021-12-15 LAB — BASIC METABOLIC PANEL
Anion gap: 7 (ref 5–15)
Anion gap: 9 (ref 5–15)
BUN: 28 mg/dL — ABNORMAL HIGH (ref 8–23)
BUN: 30 mg/dL — ABNORMAL HIGH (ref 8–23)
CO2: 22 mmol/L (ref 22–32)
CO2: 22 mmol/L (ref 22–32)
Calcium: 8.4 mg/dL — ABNORMAL LOW (ref 8.9–10.3)
Calcium: 8.9 mg/dL (ref 8.9–10.3)
Chloride: 108 mmol/L (ref 98–111)
Chloride: 107 mmol/L (ref 98–111)
Creatinine, Ser: 3.11 mg/dL — ABNORMAL HIGH (ref 0.61–1.24)
Creatinine, Ser: 3.13 mg/dL — ABNORMAL HIGH (ref 0.61–1.24)
GFR, Estimated: 20 mL/min — ABNORMAL LOW (ref >60)
GFR, Estimated: 20 mL/min — ABNORMAL LOW (ref >60)
Glucose, Bld: 144 mg/dL — ABNORMAL HIGH (ref 70–99)
Glucose, Bld: 145 mg/dL — ABNORMAL HIGH (ref 70–99)
Potassium: 3.4 mmol/L — ABNORMAL LOW (ref 3.5–5.1)
Potassium: 3.8 mmol/L (ref 3.5–5.1)
Sodium: 137 mmol/L (ref 135–145)
Sodium: 138 mmol/L (ref 135–145)

## 2021-12-15 LAB — FOLATE: Folate: 21.5 ng/mL (ref >5.9)

## 2021-12-15 LAB — GLUCOSE, CAPILLARY
Glucose-Capillary: 129 mg/dL — ABNORMAL HIGH (ref 70–99)
Glucose-Capillary: 195 mg/dL — ABNORMAL HIGH (ref 70–99)
Glucose-Capillary: 230 mg/dL — ABNORMAL HIGH (ref 70–99)
Glucose-Capillary: 136 mg/dL — ABNORMAL HIGH (ref 70–99)

## 2021-12-15 LAB — FERRITIN: Ferritin: 244 ng/mL (ref 24–336)

## 2021-12-15 LAB — PHOSPHORUS: Phosphorus: 2.7 mg/dL (ref 2.5–4.6)

## 2021-12-15 LAB — MAGNESIUM: Magnesium: 2.1 mg/dL (ref 1.7–2.4)

## 2021-12-15 MED ORDER — KCL-LACTATED RINGERS 20 MEQ/L IV SOLN
INTRAVENOUS | Status: DC
  Filled 2021-12-15: qty 1000

## 2021-12-15 MED ORDER — POTASSIUM CHLORIDE 20 MEQ PO PACK
40.0000 meq | PACK | Freq: Once | ORAL | Status: AC
  Administered 2021-12-15: 40 meq via ORAL
  Filled 2021-12-15: qty 2

## 2021-12-15 MED ORDER — POTASSIUM CHLORIDE 2 MEQ/ML IV SOLN
INTRAVENOUS | Status: DC
  Filled 2021-12-15 (×2): qty 1000

## 2021-12-15 MED ORDER — LACTATED RINGERS IV SOLN: INTRAVENOUS | Status: DC

## 2021-12-15 NOTE — Consult Note (Signed)
Shane Holloway MRN: [id number] DOB/AGE: 12-29-1950 71 y.o. Primary Care Physician:Gladman, Okey Dupre, MD Admit date: 12/10/2021 Chief Complaint:  Chief Complaint  Patient presents with   Chest Pain   HPI:   Patient is a 71 year old Caucasian male with a past medical history of coronary artery disease, BPH, congestive heart failure, CKD stage IV,  vascular dementia, CAD status post CABG and aortic valve replacement with bioprosthetic valve on Eliquis who presented to the emergency room on 7/3 with complaints of chest pain and found to have a non-STEMI as well as hypertensive urgency.   Patient was admitted to the hospitalist service and cardiology was consulted.  Eliquis stopped and started on heparin.   Echocardiogram done noted ejection fraction of 35% and grade 1 diastolic dysfunction.   Patient had interventional procedure/IV contrast on July 5 and thereafter on July 7 with cardiology doing an IV ultrasound-guided PCI of the SVG to the diagonal. Patient had worsening renal function on 7/8. Nephrology was consulted for worsening of his kidney disease Patient was seen today on second floor. Patient offers no complaint of chest pain/shortness of breath Patient offers no complaint of fever/cough or chills Patient offers no complaint of hematuria No history of kidney stones    Past Medical History:  Diagnosis Date   Allergy    Aortic stenosis    Arthritis    hands   BPH (benign prostatic hyperplasia)    CHF (congestive heart failure) (HCC)    CKD (chronic kidney disease)    Diabetes mellitus    Dyspnea    Hyperlipidemia    Hypertension    Myocardial infarction ([place]) 06/10/2002   Stroke ([place]) 09/2020   Some memory deficits   Vertigo    Wears dentures    full upper    Hospital admission Patient was admitted from December 15 till June 06, 2021 with chief complaint of Called ago with TSH/elevated LFTs AKI     Family History  Problem Relation Age of Onset   Heart  disease Mother    Heart disease Father     Social History:  reports that he quit smoking about 28 years ago. His smoking use included cigarettes. He quit smokeless tobacco use about 28 years ago. He reports that he does not drink alcohol and does not use drugs.   Allergies:  Allergies  Allergen Reactions   Ramipril Other (See Comments)    Cough with Altace   Shellfish Allergy Nausea And Vomiting   Sulfa Antibiotics Hives    Medications Prior to Admission  Medication Sig Dispense Refill   amiodarone (PACERONE) 200 MG tablet Take 1 tablet (200 mg total) by mouth daily. 30 tablet 0   amLODipine (NORVASC) 2.5 MG tablet Take 2.5 mg by mouth daily.     atorvastatin (LIPITOR) 80 MG tablet Take 80 mg by mouth daily.     Cholecalciferol 125 MCG (5000 UT) TABS Take 1 tablet by mouth daily.     cyclobenzaprine (FLEXERIL) 5 MG tablet Take 5 mg by mouth 2 (two) times daily.     ELIQUIS 5 MG TABS tablet Take 5 mg by mouth 2 (two) times daily.     finasteride (PROSCAR) 5 MG tablet Take 5 mg by mouth daily.     furosemide (LASIX) 20 MG tablet Take 1 tablet (20 mg total) by mouth daily. 30 tablet 3   glipiZIDE (GLUCOTROL XL) 10 MG 24 hr tablet Take 10 mg by mouth daily.     JARDIANCE 10 MG TABS tablet  Take 10 mg by mouth daily.     losartan (COZAAR) 100 MG tablet Take 100 mg by mouth daily.     metoprolol tartrate (LOPRESSOR) 50 MG tablet Take 1 tablet (50 mg total) by mouth 2 (two) times daily. 60 tablet 0   Tamsulosin HCl (FLOMAX) 0.4 MG CAPS TAKE ONE CAPSULE BY MOUTH EVERY DAY (Patient taking differently: Take 0.4 mg by mouth daily.) 30 capsule 2   benzonatate (TESSALON PERLES) 100 MG capsule Take 1 capsule (100 mg total) by mouth 3 (three) times daily as needed for cough. 20 capsule 0       FOY:DXAJO from the symptoms mentioned above,there are no other symptoms referable to all systems reviewed.   amiodarone  200 mg Oral Daily   apixaban  5 mg Oral BID   aspirin EC  81 mg Oral Daily    atorvastatin  80 mg Oral Daily   carvedilol  12.5 mg Oral BID WC   cholecalciferol  5,000 Units Oral Daily   clopidogrel  75 mg Oral Q breakfast   cyclobenzaprine  5 mg Oral BID   fenofibrate  54 mg Oral Daily   finasteride  5 mg Oral Daily   insulin aspart  0-15 Units Subcutaneous TID WC   insulin aspart  0-5 Units Subcutaneous QHS   isosorbide mononitrate  30 mg Oral BID   omega-3 acid ethyl esters  2 g Oral BID   sodium chloride flush  3 mL Intravenous Q12H   sodium chloride flush  3 mL Intravenous Q12H   tamsulosin  0.4 mg Oral Daily       Physical Exam: Vital signs in last 24 hours: Temp:  [98.2 F (36.8 C)-99.9 F (37.7 C)] 98.6 F (37 C) (07/08 1551) Pulse Rate:  [77-84] 81 (07/08 1551) Resp:  [18-24] 21 (07/08 1551) BP: (108-143)/(67-90) 128/85 (07/08 1551) SpO2:  [93 %-96 %] 96 % (07/08 1551) Weight change:  Last BM Date : 12/11/21  Intake/Output from previous day: 07/07 0701 - 07/08 0700 In: 480 [P.O.:480] Out: 1525 [Urine:1525] No intake/output data recorded.   Physical Exam: General- Patient is awake alert follows commands HEENT-Head is atraumatic normocephalic Resp- No acute REsp distress, CTA B/L NO Rhonchi CVS- S1S2 regular ij rate and rhythm GIT- BS+, soft,Abdomen is distended nontender EXT- NO LE Edema, no Cyanosis CNS- CN 2-12 grossly intact. Moving all 4 extremities Psych- normal mod and affect   Lab Results: CBC Recent Labs    12/14/21 0128 12/15/21 0448  WBC 5.6 5.0  HGB 12.7* 12.4*  HCT 37.9* 37.2*  PLT 94* 97*    BMET Recent Labs    12/15/21 0448 12/15/21 1352  NA 137 138  K 3.4* 3.8  CL 108 107  CO2 22 22  GLUCOSE 144* 145*  BUN 28* 30*  CREATININE 3.11* 3.13*  CALCIUM 8.4* 8.9    MICRO No results found for this or any previous visit (from the past 240 hour(s)).    Lab Results  Component Value Date   CALCIUM 8.9 12/15/2021      Impression:   1)Renal    AKI Patient could have contrast-induced  nephropathy versus Cholesterol embolization.    Data in favor of contrast induced nephropathy   patient has received contrast twice in the past 4 days  Data in favor of cholesterol Atheroemboli  patient platelet counts have decreased Data against cholesterol atheroemboli-Did not see any blue toes  We will ask for complement levels Patient has acute kidney injury secondary to  ATN Patient has AKI secondary to IV contrast/SGLT2 receptor blockers on board/patient was on RAS blockers as an outpatient Patient was on diuretics as an outpatient  Patient has AKI on CKD Patient CKD stage IV Patient has CKD most likely secondary to diabetes mellitus Patient has CKD going back to 2017 Patient CKD has been marked with multiple episodes of AKI  Creatinine trend 2023 3.1 2.5--2.7 at the time of admission  2022 1.7--4.5-AKI  2021 1.8--2.2  2020 1.5--2.0-AKI 2019 1.5--1.7 2018 1.7--1.9   2)HTN Blood pressure is at goal   3)Anemia of chronic disease Patient hemoglobin is at goal No need for Epogen for now    4)CKD Mineral-Bone Disorder/Secondary hyperparathyroidism We will check patient's phosphorus levels   5)Thrombocytopenia Patient is being followed with the primary team   6)Electrolytes   Hypokalemic Patient potassium is being repleted   NOrmonatremic   7)Acid base Co2 at goal  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 done on August 08, 2021 had shown the patient did have an M spike Schedule a follow-up as an outpatient   9) Coronary artery disease Patient admitted with non-ST elevation MI Patient is s/p cath Patient is s/p PCI Patient being closely followed by the primary team Patient being closely followed by the cardiology team   55)VZSMOLMB systolic and diastolic CHF Patient is currently well compensated   Plan:   Agree with holding the RAS blockers Agree with holding SGLT2 receptor blockers Agree with holding diuretics We will ask for  renal ultrasound We will ask for postvoid residual We will ask for complement levels We will ask for anemia profile    Shane Holloway s Shane Holloway 12/15/2021, 7:52 PM

## 2021-12-15 NOTE — Discharge Instructions (Signed)
**  PLEASE REMEMBER TO BRING ALL OF YOUR MEDICATIONS TO EACH OF YOUR FOLLOW-UP OFFICE VISITS. ° °NO HEAVY LIFTING X 2 WEEKS. °NO SEXUAL ACTIVITY X 2 WEEKS. °NO DRIVING X 1 WEEK. °NO SOAKING BATHS, HOT TUBS, POOLS, ETC., X 7 DAYS. ° °Groin Site Care °Refer to this sheet in the next few weeks. These instructions provide you with information on caring for yourself after your procedure. Your caregiver may also give you more specific instructions. Your treatment has been planned according to current medical practices, but problems sometimes occur. Call your caregiver if you have any problems or questions after your procedure. °HOME CARE INSTRUCTIONS °· You may shower 24 hours after the procedure. Remove the bandage (dressing) and gently wash the site with plain soap and water. Gently pat the site dry.  °· Do not apply powder or lotion to the site.  °· Do not sit in a bathtub, swimming pool, or whirlpool for 5 to 7 days.  °· No bending, squatting, or lifting anything over 10 pounds (4.5 kg) as directed by your caregiver.  °· Inspect the site at least twice daily.  °· Do not drive home if you are discharged the same day of the procedure. Have someone else drive you.  ° °What to expect: °· Any bruising will usually fade within 1 to 2 weeks.  °· Blood that collects in the tissue (hematoma) may be painful to the touch. It should usually decrease in size and tenderness within 1 to 2 weeks.  °SEEK IMMEDIATE MEDICAL CARE IF: °· You have unusual pain at the groin site or down the affected leg.  °· You have redness, warmth, swelling, or pain at the groin site.  °· You have drainage (other than a small amount of blood on the dressing).  °· You have chills.  °· You have a fever or persistent symptoms for more than 72 hours.  °· You have a fever and your symptoms suddenly get worse.  °· Your leg becomes pale, cool, tingly, or numb.  °You have heavy bleeding from the site. Hold pressure on the site. . ° °  ° °10 Habits of Highly Healthy  People ° °[facility] wants to help you get well and stay well.  Live a longer, healthier life by practicing healthy habits every day. ° °1.  Visit your primary care provider regularly. °2.  Make time for family and friends.  Healthy relationships are important. °3.  Take medications as directed by your provider. °4.  Maintain a healthy weight and a trim waistline. °5.  Eat healthy meals and snacks, rich in fruits, vegetables, whole grains, and lean proteins. °6.  Get moving every day - aim for 150 minutes of moderate physical activity each week. °7.  Don't smoke. °8.  Avoid alcohol or drink in moderation. °9.  Manage stress through meditation or mindful relaxation. °10.  Get seven to nine hours of quality sleep each night. ° °Want more information on healthy habits?  To learn more about these and other healthy habits, visit [email or web address]. °_____________ °  °  °

## 2021-12-15 NOTE — Progress Notes (Signed)
  Progress Note   Patient: Shane Holloway FBP:[id number] DOB: 1950-10-17 DOA: 12/10/2021     5 DOS: the patient was seen and examined on 12/15/2021   Brief hospital course: 71 year old male with past medical history of BPH, CHF, CKD, vascular dementia, CAD status post CABG and aortic valve replacement with bioprosthetic valve on Eliquis who presented to the emergency room on 7/3 with complaints of chest pain and found to have a non-STEMI as well as hypertensive urgency.  Patient was admitted to the hospitalist service and cardiology was consulted.  Eliquis stopped and started on heparin.  Echocardiogram done noted ejection fraction of 35% and grade 1 diastolic dysfunction.  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  Patient underwent interventional catheterization on 7/7 with cardiology doing an IV ultrasound-guided PCI of the SVG to the diagonal. Patient had worsening renal function on 7/8.  Started fluids.  Assessment and Plan: Non-STEMI. Hypertension urgency. Patient is status post cardiac cath, with stent placement to SVG to diagonal.  Continue dual antiplatelet treatment with aspirin Plavix, statin. Blood pressure is better.  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 Patient does not seem to have any volume overload.  Continue to follow.  Acute kidney injury on chronic kidney disease stage IIIb. Hypokalemia Renal functions are worse after heart cath.  Received IV fluids, renal function is not getting better yet.  Continue low-dose fluids, consult nephrology.  Uncontrolled type 2 diabetes with hyperglycemia. Discontinue oral diabetic medicines, start sliding scale insulin.  Obesity with BMI 30.32.     Subjective:  Patient doing well today, denies any short of breath or chest pain.  Physical Exam: Vitals:   12/15/21 0002 12/15/21 0334 12/15/21 0829  12/15/21 1212  BP: 124/86 134/88 (!) 143/90 108/67  Pulse: 84 81 82 80  Resp: 18 (!) 22 (!) 24 (!) 22  Temp: 98.2 F (36.8 C) 99.9 F (37.7 C) 99.1 F (37.3 C) 98.6 F (37 C)  TempSrc:   Oral Axillary  SpO2: 95% 94% 95% 95%  Weight:      Height:       General exam: Appears calm and comfortable  Respiratory system: Clear to auscultation. Respiratory effort normal. Cardiovascular system: S1 & S2 heard, RRR. No JVD, murmurs, rubs, gallops or clicks. No pedal edema. Gastrointestinal system: Abdomen is nondistended, soft and nontender. No organomegaly or masses felt. Normal bowel sounds heard. Central nervous system: Alert and oriented. No focal neurological deficits. Extremities: Symmetric 5 x 5 power. Skin: No rashes, lesions or ulcers Psychiatry: Judgement and insight appear normal. Mood & affect appropriate.   Data Reviewed:  Lab results reviewed  Family Communication: None  Disposition: Status is: Inpatient Remains inpatient appropriate because: Severity of disease  Planned Discharge Destination: Home with Home Health    Time spent: 35 minutes  Author: Sharen Hones, MD 12/15/2021 3:47 PM  For on call review [email or web address].

## 2021-12-15 NOTE — Progress Notes (Signed)
Cardiology Progress Note   Patient Name: Shane Holloway Date of Encounter: 12/15/2021  Primary Cardiologist: [facility]  Subjective   Patient says he doesn't feel good  "I feel like I have a cold"  Coughing a lot   Nonproductive cough    Some nausea No CP      Inpatient Medications    Scheduled Meds:  amiodarone  200 mg Oral Daily   apixaban  5 mg Oral BID   aspirin EC  81 mg Oral Daily   atorvastatin  80 mg Oral Daily   carvedilol  12.5 mg Oral BID WC   cholecalciferol  5,000 Units Oral Daily   clopidogrel  75 mg Oral Q breakfast   cyclobenzaprine  5 mg Oral BID   empagliflozin  10 mg Oral Daily   fenofibrate  54 mg Oral Daily   finasteride  5 mg Oral Daily   glipiZIDE  10 mg Oral Daily   insulin aspart  0-15 Units Subcutaneous TID WC   insulin aspart  0-5 Units Subcutaneous QHS   isosorbide mononitrate  30 mg Oral BID   omega-3 acid ethyl esters  2 g Oral BID   sodium chloride flush  3 mL Intravenous Q12H   sodium chloride flush  3 mL Intravenous Q12H   tamsulosin  0.4 mg Oral Daily   Continuous Infusions:  sodium chloride     sodium chloride     lactated ringers 1,000 mL with potassium chloride 20 mEq/L Pediatric IV infusion 125 mL/hr at 12/15/21 0924   PRN Meds: sodium chloride, sodium chloride, acetaminophen, alum & mag hydroxide-simeth, benzonatate, hydrALAZINE, LORazepam, menthol-cetylpyridinium, morphine injection, nitroGLYCERIN, ondansetron (ZOFRAN) IV, sodium chloride flush   Vital Signs    Vitals:   12/14/21 2021 12/15/21 0002 12/15/21 0334 12/15/21 0829  BP: 114/73 124/86 134/88 (!) 143/90  Pulse: 77 84 81 82  Resp:  18 (!) 22 (!) 24  Temp: 99.7 F (37.6 C) 98.2 F (36.8 C) 99.9 F (37.7 C) 99.1 F (37.3 C)  TempSrc: Oral   Oral  SpO2: 93% 95% 94% 95%  Weight:      Height:        Intake/Output Summary (Last 24 hours) at 12/15/2021 0932 Last data filed at 12/15/2021 0831 Gross per 24 hour  Intake 480 ml  Output 1575 ml  Net -1095 ml   Filed  Weights   12/10/21 1757  Weight: 105.7 kg    Physical Exam   GEN: Well nourished, well developed, in no acute distress. Appears fatigued  HEENT: Grossly normal.  Neck: no JVD Cardiac: RRR, I -II/VI systolic murmur No LE edema  Radials 2+, DP/PT 2+ and equal bilaterally.  R radial cath site w/o bleeding/bruit/hematoma. Respiratory:  Respirations regular and unlabored, clear to auscultation bilaterally.  No wheezes  GI: Soft, nontender, nondistended, BS + x 4. MS: no deformity or atrophy. Skin: warm and dry, no rash. Neuro:  Strength and sensation are intact. Psych: AAOx3.  +++mild cognitive impairment. Normal affect.  Labs    Chemistry Recent Labs  Lab 12/13/21 0533 12/14/21 0128 12/15/21 0448  NA 139 137 137  K 3.1* 3.6 3.4*  CL 108 110 108  CO2 24 21* 22  GLUCOSE 171* 206* 144*  BUN 26* 28* 28*  CREATININE 2.70* 2.79* 3.11*  CALCIUM 8.2* 8.4* 8.4*  GFRNONAA 24* 23* 20*  ANIONGAP '7 6 7     '$ Hematology Recent Labs  Lab 12/13/21 0533 12/14/21 0128 12/15/21 0448  WBC 6.7 5.6 5.0  RBC 4.53 4.46 4.34  HGB 12.9* 12.7* 12.4*  HCT 38.7* 37.9* 37.2*  MCV 85.4 85.0 85.7  MCH 28.5 28.5 28.6  MCHC 33.3 33.5 33.3  RDW 13.7 13.3 13.5  PLT 102* 94* 97*    Cardiac Enzymes  Recent Labs  Lab 12/10/21 1754 12/10/21 2105 12/11/21 0922  TROPONINIHS 51* 919* 15,109*      BNP    Component Value Date/Time   BNP 288.3 (H) 12/11/2021 2233   Lipids  Lab Results  Component Value Date   CHOL 171 12/11/2021   HDL 28 (L) 12/11/2021   LDLCALC UNABLE TO CALCULATE IF TRIGLYCERIDE OVER 400 mg/dL 12/11/2021   LDLDIRECT 58.1 12/11/2021   TRIG 509 (H) 12/11/2021   CHOLHDL 6.1 12/11/2021    HbA1c  Lab Results  Component Value Date   HGBA1C 8.9 (H) 12/11/2021    Radiology    -------------  Telemetry    NSR, rates in the 60s - Personally Reviewed  ECG    No new readings - Personally Reviewed  Cardiac Studies  Cardiac Catheterization  12/12/21:  Diagnostic Dominance: Right  Severe native coronary artery disease, as detailed below. Widely patent LIMA-LAD. Patent SVG Y-graft to OM1 and D1.  There is mild diffuse disease involving the segment to OM1.  There is a focal 90% stenosis in the segment to D1 that most likely represents the culprit for the patient's NSTEMI. Patent SVG-rPDA with diffuse graft ectasia and 60% tubular stenosis in the proximal/mid graft. Upper normal left and right heart filling pressures. Discordant Fick and thermodilution cardiac output (Fick CO/CI 4.7/2.1; thermodilution CO/CI 3.1/1.3).  Question significant TR contributing to discrepancy. _____________   2D echo 12/11/2021: 1. Left ventricular ejection fraction, by estimation, is 35 to 40%. The  left ventricle has moderately decreased function. The left ventricle has  no regional wall motion abnormalities. There is mild left ventricular  hypertrophy. Left ventricular  diastolic parameters are consistent with Grade I diastolic dysfunction  (impaired relaxation).   2. Right ventricular systolic function is normal. The right ventricular  size is normal. Tricuspid regurgitation signal is inadequate for assessing  PA pressure.   3. The mitral valve is normal in structure. No evidence of mitral valve  regurgitation. No evidence of mitral stenosis.   4. The aortic valve was not well visualized. Aortic valve regurgitation  is not visualized. No aortic stenosis is present.   5. The inferior vena cava is normal in size with greater than 50%  respiratory variability, suggesting right atrial pressure of 3 mmHg.  Patient Profile     71 y.o. male with history of CAD status post three-vessel CABG in 2009 with redo two-vessel CABG in 2015 at [facility] with SVG to OM and SVG to RCA, aortic root repair, aortic stenosis status post bioprosthetic AVR in 2015 at [facility], refractory SVT, embolic CVA, CKD stage III, cognitive impairment, left upper extremity DVT on apixaban,  DM2, HTN, HLD, and BPH.  He is admitted and being treated this stay for NSTEMI.  Assessment & Plan    1.  NSTEMI/CAD : patient has history of CAD/CABG with redo in 2015.  He presented 7/3 w/ chest pain and NSTEMI.   Status post PCI and drug-eluting stent placement to SVG to diagonal yesterday Continue aspirin, lipitor, and coreg.  Continue clopidogrel which should be used for 1 year. Will d/c ASA   Keep on Plavix and Eliquis  2.  Acute HRrEF/Ischemic Cardiomyopathy:  EF prev nl in 01/2021.  35-40% on echo this  admission.  Euvolemic on exam.  Receiving IV fluids now  125 cc/hour   Follow I/O closely and Cr    Check BMET this afteroon   Need to taper back when comes down.   Continue coreg and Jardiance.  Lasix and losartan on hold due to renal function  ? Entresto   COnsider as outpt.   3.  PSVT: currently in NSR on telemetry.  Continue amiodarone and coreg.  4.  CKD stage III:   Cr is 3.11 today   Grad climbing   2.51, 2.7    Minimal contrast used.   Receiving hydration    Follow closely  BMET this PM   Cut back on IV as trends down    5.  HTN: BP 338S to 505L systolic.   Follow   Hld losartan for now given renal function      6.  Hx of DVT: Eliquis resumed     7.   Cold like symptoms     8 Mild cognitive impairment: short term memory, niece and brother at beside.  9.  DM2: A1C 8.9 on 12/11/21.  Currently on glipizide, insulin aspart with meals, and Jardiance.  Management per IM team.   Signed, Dorris Carnes, MD  12/15/2021, 9:32 AM

## 2021-12-16 ENCOUNTER — Inpatient Hospital Stay: Payer: Medicare Other

## 2021-12-16 DIAGNOSIS — U071 COVID-19: Secondary | ICD-10-CM

## 2021-12-16 DIAGNOSIS — I214 Non-ST elevation (NSTEMI) myocardial infarction: Secondary | ICD-10-CM | POA: Diagnosis not present

## 2021-12-16 DIAGNOSIS — J9601 Acute respiratory failure with hypoxia: Secondary | ICD-10-CM

## 2021-12-16 DIAGNOSIS — D696 Thrombocytopenia, unspecified: Secondary | ICD-10-CM

## 2021-12-16 HISTORY — DX: COVID-19: U07.1

## 2021-12-16 LAB — BASIC METABOLIC PANEL
Anion gap: 7 (ref 5–15)
BUN: 32 mg/dL — ABNORMAL HIGH (ref 8–23)
CO2: 19 mmol/L — ABNORMAL LOW (ref 22–32)
Calcium: 8.3 mg/dL — ABNORMAL LOW (ref 8.9–10.3)
Chloride: 108 mmol/L (ref 98–111)
Creatinine, Ser: 3.04 mg/dL — ABNORMAL HIGH (ref 0.61–1.24)
GFR, Estimated: 21 mL/min — ABNORMAL LOW (ref >60)
Glucose, Bld: 96 mg/dL (ref 70–99)
Potassium: 3.6 mmol/L (ref 3.5–5.1)
Sodium: 134 mmol/L — ABNORMAL LOW (ref 135–145)

## 2021-12-16 LAB — SARS CORONAVIRUS 2 BY RT PCR: SARS Coronavirus 2 by RT PCR: POSITIVE — AB

## 2021-12-16 LAB — GLUCOSE, CAPILLARY
Glucose-Capillary: 92 mg/dL (ref 70–99)
Glucose-Capillary: 119 mg/dL — ABNORMAL HIGH (ref 70–99)
Glucose-Capillary: 153 mg/dL — ABNORMAL HIGH (ref 70–99)
Glucose-Capillary: 263 mg/dL — ABNORMAL HIGH (ref 70–99)

## 2021-12-16 LAB — CBC
HCT: 36.4 % — ABNORMAL LOW (ref 39.0–52.0)
Hemoglobin: 12.1 g/dL — ABNORMAL LOW (ref 13.0–17.0)
MCH: 28.6 pg (ref 26.0–34.0)
MCHC: 33.2 g/dL (ref 30.0–36.0)
MCV: 86.1 fL (ref 80.0–100.0)
Platelets: 115 10*3/uL — ABNORMAL LOW (ref 150–400)
RBC: 4.23 MIL/uL (ref 4.22–5.81)
RDW: 13.5 % (ref 11.5–15.5)
WBC: 3.8 10*3/uL — ABNORMAL LOW (ref 4.0–10.5)
nRBC: 0 % (ref 0.0–0.2)

## 2021-12-16 LAB — VITAMIN B12: Vitamin B-12: 221 pg/mL (ref 180–914)

## 2021-12-16 MED ORDER — SODIUM CHLORIDE 0.9 % IV SOLN
200.0000 mg | Freq: Once | INTRAVENOUS | Status: AC
  Administered 2021-12-16: 200 mg via INTRAVENOUS
  Filled 2021-12-16: qty 10

## 2021-12-16 MED ORDER — DEXAMETHASONE SODIUM PHOSPHATE 10 MG/ML IJ SOLN
8.0000 mg | INTRAMUSCULAR | Status: DC
  Administered 2021-12-16 – 2021-12-18 (×3): 8 mg via INTRAVENOUS
  Filled 2021-12-16 (×3): qty 1

## 2021-12-16 MED ORDER — SODIUM CHLORIDE 0.9 % IV SOLN
200.0000 mg | Freq: Once | INTRAVENOUS | Status: AC
  Administered 2021-12-16: 200 mg via INTRAVENOUS
  Filled 2021-12-16: qty 40

## 2021-12-16 MED ORDER — SODIUM CHLORIDE 0.9 % IV SOLN
125.0000 mg | Freq: Once | INTRAVENOUS | Status: DC
  Filled 2021-12-16: qty 10

## 2021-12-16 MED ORDER — SODIUM CHLORIDE 0.9 % IV SOLN
100.0000 mg | Freq: Every day | INTRAVENOUS | Status: AC
  Administered 2021-12-17 – 2021-12-18 (×2): 100 mg via INTRAVENOUS
  Filled 2021-12-16 (×2): qty 20

## 2021-12-16 MED ORDER — SODIUM BICARBONATE 650 MG PO TABS
650.0000 mg | ORAL_TABLET | Freq: Two times a day (BID) | ORAL | Status: DC
  Administered 2021-12-16 – 2021-12-23 (×15): 650 mg via ORAL
  Filled 2021-12-16 (×15): qty 1

## 2021-12-16 MED ORDER — FERROUS SULFATE 325 (65 FE) MG PO TABS: 325.0000 mg | ORAL_TABLET | Freq: Every day | ORAL | Status: DC

## 2021-12-16 NOTE — Evaluation (Signed)
Physical Therapy Evaluation Patient Details Name: Shane Holloway MRN: [id number] DOB: 31-Oct-1950 Today's Date: 12/16/2021  History of Present Illness  Patient is a 71 year old Caucasian male with a past medical history of coronary artery disease, BPH, congestive heart failure, CKD stage IV,  CVA, vascular dementia, CAD status post CABG and aortic valve replacement with bioprosthetic valve on Eliquis who presented to the emergency room on 7/3 with complaints of chest pain and found to have a non-STEMI as well as hypertensive urgency. Underwent heart catherization on 7/5 and 7/7 and PCI of SVG to diagonal.   Clinical Impression  Patient alert, oriented to self only reported a stroke and a heart issue is why he is at the hospital. Able to identify family in room. Provided some PLOF and family assisted. At baseline pt lives alone with his brother living next door to provide meals and medications, nieces do the cleaning, and family assists with errands/driving. Pt stated he does not use AD, but then endorsed using his cane outside the house.     The patient was able to perform supine <> sit with supervision, some impulsivity noted throughout mobility. Sit <> stand with RW/without RW, supervision. Pt generally impulsive, ambulates quickly, 1-2 staggers needing minA for correction to maintain balance, cued for RW management and placement. Able to ambulate without but still mildly unsteady/impulsive.  Overall the patient demonstrated deficits (see "PT Problem List") that impede the patient's functional abilities, safety, and mobility and would benefit from skilled PT intervention. Recommendation at this time is HHPT with constant supervision/assistance due to some instability as well as to increase safety. If family unable to provide adequate support, may benefit from transitions to higher levels of care.        Recommendations for follow up therapy are one component of a multi-disciplinary discharge  planning process, led by the attending physician.  Recommendations may be updated based on patient status, additional functional criteria and insurance authorization.  Follow Up Recommendations Home health PT      Assistance Recommended at Discharge Frequent or constant Supervision/Assistance  Patient can return home with the following  A little help with walking and/or transfers;A little help with bathing/dressing/bathroom;Assistance with cooking/housework;Direct supervision/assist for financial management;Assist for transportation;Help with stairs or ramp for entrance;Direct supervision/assist for medications management    Equipment Recommendations Rolling walker (2 wheels)  Recommendations for Other Services       Functional Status Assessment Patient has had a recent decline in their functional status and demonstrates the ability to make significant improvements in function in a reasonable and predictable amount of time.     Precautions / Restrictions Precautions Precautions: Fall Restrictions Weight Bearing Restrictions: No      Mobility  Bed Mobility Overal bed mobility: Needs Assistance Bed Mobility: Supine to Sit, Sit to Supine     Supine to sit: Supervision Sit to supine: Supervision        Transfers Overall transfer level: Needs assistance Equipment used: Rolling walker (2 wheels), None Transfers: Sit to/from Stand Sit to Stand: Supervision                Ambulation/Gait Ambulation/Gait assistance: Min assist, Min guard Gait Distance (Feet): 35 Feet Assistive device: Rolling walker (2 wheels), None         General Gait Details: pt generally impulsive, ambulates quickly, 1-2 staggers needing minA for correction to maintain balance, cued for RW management and placement. Able to ambulate without but still mildly unsteady/impulsive  Stairs  Wheelchair Mobility    Modified Rankin (Stroke Patients Only)       Balance Overall  balance assessment: Needs assistance Sitting-balance support: Feet supported Sitting balance-Leahy Scale: Good       Standing balance-Leahy Scale: Fair Standing balance comment: some staggering and impulsivity impacting balance                             Pertinent Vitals/Pain Pain Assessment Pain Assessment: No/denies pain    Home Living Family/patient expects to be discharged to:: Private residence Living Arrangements: Alone Available Help at Discharge: Family;Available PRN/intermittently Type of Home: House Home Access: Ramped entrance       Home Layout: One level Home Equipment: Cane - single point Additional Comments: pt endorsed no falls    Prior Function Prior Level of Function : Needs assist;Patient poor historian/Family not available             Mobility Comments: no AD at baseline ADLs Comments: family assists wtih IADLs (meals and med management from brother that lives next door)     Hand Dominance        Extremity/Trunk Assessment   Upper Extremity Assessment Upper Extremity Assessment: Overall WFL for tasks assessed    Lower Extremity Assessment Lower Extremity Assessment: Generalized weakness    Cervical / Trunk Assessment Cervical / Trunk Assessment: Normal  Communication   Communication: No difficulties  Cognition Arousal/Alertness: Awake/alert Behavior During Therapy: WFL for tasks assessed/performed Overall Cognitive Status: History of cognitive impairments - at baseline                                 General Comments: oriented to self, family in room, disoriented to date, situation, location        General Comments      Exercises     Assessment/Plan    PT Assessment Patient needs continued PT services  PT Problem List Decreased strength;Decreased mobility;Decreased range of motion;Decreased activity tolerance;Decreased balance;Pain;Decreased knowledge of use of DME;Decreased knowledge of  precautions       PT Treatment Interventions DME instruction;Therapeutic exercise;Gait training;Balance training;Stair training;Neuromuscular re-education;Functional mobility training;Therapeutic activities;Patient/family education    PT Goals (Current goals can be found in the Care Plan section)  Acute Rehab PT Goals Patient Stated Goal: to go home PT Goal Formulation: With family Time For Goal Achievement: 12/30/21 Potential to Achieve Goals: Good    Frequency Min 2X/week     Co-evaluation               AM-PAC PT "6 Clicks" Mobility  Outcome Measure Help needed turning from your back to your side while in a flat bed without using bedrails?: None Help needed moving from lying on your back to sitting on the side of a flat bed without using bedrails?: None Help needed moving to and from a bed to a chair (including a wheelchair)?: A Little Help needed standing up from a chair using your arms (e.g., wheelchair or bedside chair)?: A Little Help needed to walk in hospital room?: A Little Help needed climbing 3-5 steps with a railing? : A Little 6 Click Score: 20    End of Session Equipment Utilized During Treatment: Gait belt Activity Tolerance: Patient tolerated treatment well Patient left: in bed;with call bell/phone within reach Nurse Communication: Mobility status PT Visit Diagnosis: Other abnormalities of gait and mobility (R26.89);Difficulty in walking, not elsewhere classified (R26.2);Muscle  weakness (generalized) (M62.81)    Time: 4847-2072 PT Time Calculation (min) (ACUTE ONLY): 21 min   Charges:   PT Evaluation $PT Eval Low Complexity: 1 Low PT Treatments $Therapeutic Activity: 8-22 mins       Lieutenant Diego PT, DPT 3:09 PM,12/16/21

## 2021-12-16 NOTE — Progress Notes (Signed)
Shane Holloway  MRN: [id number]  DOB/AGE: [birth year] 71 y.o.  Primary Care Physician:Gladman, Okey Dupre, MD  Admit date: 12/10/2021  Chief Complaint:  Chief Complaint  Patient presents with   Chest Pain    S-Pt presented on  12/10/2021 with  Chief Complaint  Patient presents with   Chest Pain  .    Pt today feels better    amiodarone  200 mg Oral Daily   apixaban  5 mg Oral BID   aspirin EC  81 mg Oral Daily   atorvastatin  80 mg Oral Daily   carvedilol  12.5 mg Oral BID WC   cholecalciferol  5,000 Units Oral Daily   clopidogrel  75 mg Oral Q breakfast   cyclobenzaprine  5 mg Oral BID   dexamethasone (DECADRON) injection  8 mg Intravenous Q24H   fenofibrate  54 mg Oral Daily   finasteride  5 mg Oral Daily   insulin aspart  0-15 Units Subcutaneous TID WC   insulin aspart  0-5 Units Subcutaneous QHS   isosorbide mononitrate  30 mg Oral BID   omega-3 acid ethyl esters  2 g Oral BID   sodium bicarbonate  650 mg Oral BID   sodium chloride flush  3 mL Intravenous Q12H   sodium chloride flush  3 mL Intravenous Q12H   tamsulosin  0.4 mg Oral Daily         VCB:SWHQP from the symptoms mentioned above,there are no other symptoms referable to all systems reviewed.  Physical Exam: Vital signs in last 24 hours: Temp:  [98.2 F (36.8 C)-98.6 F (37 C)] 98.2 F (36.8 C) (07/09 0811) Pulse Rate:  [70-81] 73 (07/09 0811) Resp:  [13-21] 16 (07/09 0811) BP: (115-128)/(77-88) 120/84 (07/09 0811) SpO2:  [94 %-100 %] 100 % (07/09 0811) Weight change:  Last BM Date : 12/11/21  Intake/Output from previous day: 07/08 0701 - 07/09 0700 In: 1295 [I.V.:1295] Out: 1550 [Urine:1550] Total I/O In: -  Out: 425 [Urine:425]   Physical Exam:  General- pt is awake,alert, oriented to time place and person  Resp- No acute REsp distress, CTA B/L NO Rhonchi  CVS- S1S2 regular in rate and rhythm  GIT- BS+, soft, Non tender , Non distended  EXT- No LE Edema,  No  Cyanosis    Lab Results:  CBC  Recent Labs    12/15/21 0448 12/16/21 0445  WBC 5.0 3.8*  HGB 12.4* 12.1*  HCT 37.2* 36.4*  PLT 97* 115*    BMET  Recent Labs    12/15/21 1352 12/16/21 0445  NA 138 134*  K 3.8 3.6  CL 107 108  CO2 22 19*  GLUCOSE 145* 96  BUN 30* 32*  CREATININE 3.13* 3.04*  CALCIUM 8.9 8.3*      Most recent Creatinine trend  Lab Results  Component Value Date   CREATININE 3.04 (H) 12/16/2021   CREATININE 3.13 (H) 12/15/2021   CREATININE 3.11 (H) 12/15/2021      MICRO   Recent Results (from the past 240 hour(s))  SARS Coronavirus 2 by RT PCR (hospital order, performed in [place] hospital lab) *cepheid single result test* Anterior Nasal Swab     Status: Abnormal   Collection Time: 12/16/21  8:49 AM   Specimen: Anterior Nasal Swab  Result Value Ref Range Status   SARS Coronavirus 2 by RT PCR POSITIVE (A) NEGATIVE Final    Comment: (NOTE) SARS-CoV-2 target nucleic acids are DETECTED  SARS-CoV-2 RNA is generally detectable in upper respiratory  specimens  during the acute phase of infection.  Positive results are indicative  of the presence of the identified virus, but do not rule out bacterial infection or co-infection with other pathogens not detected by the test.  Clinical correlation with patient history and  other diagnostic information is necessary to determine patient infection status.  The expected result is negative.  Fact Sheet for Patients:   https://www.patel.info/   Fact Sheet for Healthcare Providers:   https://hall.com/    This test is not yet approved or cleared by the [place] FDA and  has been authorized for detection and/or diagnosis of SARS-CoV-2 by FDA under an Emergency Use Authorization (EUA).  This EUA will remain in effect (meaning this test can be used) for the duration of  the COVID-19 declaration under Section 564(b)(1)  of the Act, 21 U.S.C. section  360-bbb-3(b)(1), unless the authorization is terminated or revoked sooner.   Performed at [facility], [address] [place], [place] [address]          Impression:   1)Renal     AKI Patient could have contrast-induced nephropathy versus Cholesterol embolization.     Data in favor of contrast induced nephropathy   patient has received contrast twice in the past few days   Data in favor of cholesterol Atheroemboli  patient platelet counts did decrease Data against cholesterol atheroemboli-Did not see any blue toes/no clinical symptoms  Awaiting for complement levels  Patient has acute kidney injury secondary to ATN Patient has AKI secondary to IV contrast/SGLT2 receptor blockers on board/patient was on RAS blockers as an outpatient Patient was on diuretics as an outpatient   Patient has AKI on CKD Patient CKD stage IV Patient has CKD most likely secondary to diabetes mellitus Patient has CKD going back to 2017 Patient CKD has been marked with multiple episodes of AKI   Creatinine trend 2023 3.0--3.1 2.5--2.7 at the time of admission   2022 1.7--4.5-AKI   2021 1.8--2.2   2020 1.5--2.0-AKI 2019 1.5--1.7 2018 1.7--1.9        2)HTN    Blood pressure is stable    3)Anemia of chronic disease/Iron deficiency anemia     Latest Ref Rng & Units 12/16/2021    4:45 AM 12/15/2021    4:48 AM 12/14/2021    1:28 AM  CBC  WBC 4.0 - 10.5 K/uL 3.8  5.0  5.6   Hemoglobin 13.0 - 17.0 g/dL 12.1  12.4  12.7   Hematocrit 39.0 - 52.0 % 36.4  37.2  37.9   Platelets 150 - 400 K/uL 115  97  94       Latest Reference Range & Units 12/15/21 13:52  Iron 45 - 182 ug/dL 26 (L)  UIBC ug/dL 205  TIBC 250 - 450 ug/dL 231 (L)  Saturation Ratios 17.9 - 39.5 % 11 (L)  Ferritin 24 - 336 ng/mL 244  Folate >5.9 ng/mL 21.5  (L): Data is abnormally low  HGb at goal (9--11)   We will start patient on l iron supplement We will discuss with the primary team regarding  further work-up   4) Secondary hyperparathyroidism -CKD Mineral-Bone Disorder    Lab Results  Component Value Date   CALCIUM 8.3 (L) 12/16/2021   PHOS 2.7 12/15/2021     Phosphorus at goal.   5)Thrombocytopenia Patient platelet counts are now better Patient is being followed with the primary team   6) Electrolytes      Latest Ref Rng & Units  12/16/2021    4:45 AM 12/15/2021    1:52 PM 12/15/2021    4:48 AM  BMP  Glucose 70 - 99 mg/dL 96  145  144   BUN 8 - 23 mg/dL 32  30  28   Creatinine 0.61 - 1.24 mg/dL 3.04  3.13  3.11   Sodium 135 - 145 mmol/L 134  138  137   Potassium 3.5 - 5.1 mmol/L 3.6  3.8  3.4   Chloride 98 - 111 mmol/L 108  107  108   CO2 22 - 32 mmol/L '19  22  22   '$ Calcium 8.9 - 10.3 mg/dL 8.3  8.9  8.4      Sodium Hyponatremic We will follow  Potassium Normokalemic    7) acute metabolic acidosis Patient bicarb is worsening We will start patient on oral sodium bicarb We will follow   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 done on August 08, 2021 had shown the patient did have an M spike Schedule a follow-up as an outpatient    9) Coronary artery disease Patient admitted with non-ST elevation MI Patient is s/p cath Patient is s/p PCI Patient being closely followed by the primary team Patient being closely followed by the cardiology team     01)KPVVZSMO systolic and diastolic CHF Patient is currently well compensated  Plan:   We will start patient IV Iron  supplementation We will discuss the primary team regarding need for further iron deficiency anemia work-up as an inpatient versus outpatient      Shane Holloway s Shane Holloway 12/16/2021, 3:13 PM

## 2021-12-16 NOTE — Progress Notes (Addendum)
   Cardiology Progress Note   Patient Name: Shane Holloway Date of Encounter: 12/16/2021  Primary Cardiologist: [facility]   Patient seen  Did not exam given COVID He denies CP though     Impression/Recomm: 1  NSTEMI/CAD : patient has history of CAD/CABG with redo in 2015.  He presented 7/3 w/ chest pain and NSTEMI.   Status post PCI and drug-eluting stent placement to SVG to diagonal yesterday Continue aspirin, lipitor, and coreg.  Continue clopidogrel which should be used for 1 year. Will d/c ASA   Keep on Plavix and Eliquis  2.  Acute HRrEF/Ischemic Cardiomyopathy:  EF prev nl in 01/2021.  35-40% on echo this admission.   Renal is following now, making recommendations   Volume has been OK  Only on carvedilol for now    3.  PSVT: currently in NSR on telemetry.  Continue amiodarone and coreg.  4.  CKD stage III:   Cr is 3.04 today   Grad climbing   2.51, 2.7, 3.11.      Minimal contrast used.   Received hydration     REnal is following pt now  Felt, with timing, bump due to contrast nephropathy   Only 25 cc used Will defer to renal for volume management    5.  HTN: BP 695Q to 722V systolic.   Follow   Hld losartan for now given renal function      6.  Hx of DVT: Eliquis resumed     7.   Cold like symptoms  Found to be COVID +   Family members at home are + as well    Signed, Dorris Carnes, MD  12/16/2021, 12:18 PM

## 2021-12-16 NOTE — Progress Notes (Addendum)
  Progress Note   Patient: Shane Holloway UYQ:[id number] DOB: Oct 05, 1950 DOA: 12/10/2021     6 DOS: the patient was seen and examined on 12/16/2021   Brief hospital course: 71 year old male with past medical history of BPH, CHF, CKD, vascular dementia, CAD status post CABG and aortic valve replacement with bioprosthetic valve on Eliquis who presented to the emergency room on 7/3 with complaints of chest pain and found to have a non-STEMI as well as hypertensive urgency.  Patient was admitted to the hospitalist service and cardiology was consulted.  Eliquis stopped and started on heparin.  Echocardiogram done noted ejection fraction of 35% and grade 1 diastolic dysfunction.  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  Patient underwent interventional catheterization on 7/7 with cardiology doing an IV ultrasound-guided PCI of the SVG to the diagonal. Patient had worsening renal function on 7/8.  Started fluids. 7/9.  Renal function starting getting better, however, patient developed significant cough, COVID test come back positive.  Started steroids.  Assessment and Plan: Non-STEMI. Hypertension urgency. Patient is status post cardiac cath, with stent placement to SVG to diagonal.  Continue dual antiplatelet treatment with aspirin Plavix, statin. Condition has stabilized.  Acute hypoxemic respiratory failure. COVID infection. Patient developed hypoxemia overnight, was placed on 2 L oxygen.  Patient started have a cough, but no fever.  COVID test positive. We will start dexamethasone IV.  Paxlovid is contraindicated due to renal failure.  Obtain chest x-ray. I believe that remdesivir benefit overweighs the risk.  We will start renal dosing.  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 Condition appears to be stable.  Acute kidney injury on chronic kidney disease stage  IIIb. Hypokalemia Metabolic acidosis. Renal function is better, add  sodium bicarbonate for metabolic acidosis.  Uncontrolled type 2 diabetes with hyperglycemia. Continue sliding scale insulin.  Thrombocytopenia. Likely due to COVID, stable   Obesity with BMI 30.32.      Subjective:  Patient has some short of breath today, cough, hypoxia, was placed on 2 L oxygen.  Physical Exam: Vitals:   12/15/21 2000 12/16/21 0055 12/16/21 0450 12/16/21 0811  BP: 115/77 122/88 117/82 120/84  Pulse: 72 70 72 73  Resp: '13 14 16 16  '$ Temp: 98.5 F (36.9 C) 98.5 F (36.9 C) 98.4 F (36.9 C) 98.2 F (36.8 C)  TempSrc: Oral Oral Oral Oral  SpO2: 94% 96% 94% 100%  Weight:      Height:       General exam: Appears calm and comfortable  Respiratory system: Coarse breathing sounds. Respiratory effort normal. Cardiovascular system: S1 & S2 heard, RRR. No JVD, murmurs, rubs, gallops or clicks. No pedal edema. Gastrointestinal system: Abdomen is nondistended, soft and nontender. No organomegaly or masses felt. Normal bowel sounds heard. Central nervous system: Alert and oriented x3. No focal neurological deficits. Extremities: Symmetric 5 x 5 power. Skin: No rashes, lesions or ulcers Psychiatry: Judgement and insight appear normal. Mood & affect appropriate.    Data Reviewed:  Lab results reviewed.  Family Communication: Brother at bedside.  Disposition: Status is: Inpatient Remains inpatient appropriate because: Severity of disease, IV treatment  Planned Discharge Destination: Home with Home Health    Time spent: 55 minutes  Author: Sharen Hones, MD 12/16/2021 1:32 PM  For on call review [email or web address].

## 2021-12-17 DIAGNOSIS — I214 Non-ST elevation (NSTEMI) myocardial infarction: Secondary | ICD-10-CM | POA: Diagnosis not present

## 2021-12-17 DIAGNOSIS — U071 COVID-19: Secondary | ICD-10-CM | POA: Diagnosis not present

## 2021-12-17 DIAGNOSIS — N170 Acute kidney failure with tubular necrosis: Secondary | ICD-10-CM | POA: Diagnosis not present

## 2021-12-17 DIAGNOSIS — I5042 Chronic combined systolic (congestive) and diastolic (congestive) heart failure: Secondary | ICD-10-CM | POA: Diagnosis not present

## 2021-12-17 LAB — GLUCOSE, CAPILLARY
Glucose-Capillary: 238 mg/dL — ABNORMAL HIGH (ref 70–99)
Glucose-Capillary: 299 mg/dL — ABNORMAL HIGH (ref 70–99)
Glucose-Capillary: 254 mg/dL — ABNORMAL HIGH (ref 70–99)
Glucose-Capillary: 293 mg/dL — ABNORMAL HIGH (ref 70–99)

## 2021-12-17 LAB — BASIC METABOLIC PANEL
Anion gap: 9 (ref 5–15)
BUN: 42 mg/dL — ABNORMAL HIGH (ref 8–23)
CO2: 20 mmol/L — ABNORMAL LOW (ref 22–32)
Calcium: 9 mg/dL (ref 8.9–10.3)
Chloride: 107 mmol/L (ref 98–111)
Creatinine, Ser: 3.12 mg/dL — ABNORMAL HIGH (ref 0.61–1.24)
GFR, Estimated: 20 mL/min — ABNORMAL LOW (ref >60)
Glucose, Bld: 260 mg/dL — ABNORMAL HIGH (ref 70–99)
Potassium: 4.5 mmol/L (ref 3.5–5.1)
Sodium: 136 mmol/L (ref 135–145)

## 2021-12-17 LAB — C3 COMPLEMENT: C3 Complement: 111 mg/dL (ref 82–167)

## 2021-12-17 LAB — C4 COMPLEMENT: Complement C4, Body Fluid: 36 mg/dL (ref 12–38)

## 2021-12-17 LAB — MAGNESIUM: Magnesium: 2.2 mg/dL (ref 1.7–2.4)

## 2021-12-17 NOTE — Progress Notes (Signed)
Physical Therapy Treatment Patient Details Name: Shane Holloway MRN: [id number] DOB: 1951-03-18 Today's Date: 12/17/2021   History of Present Illness Patient is a 71 year old Caucasian male with a past medical history of coronary artery disease, BPH, congestive heart failure, CKD stage IV,  CVA, vascular dementia, CAD status post CABG and aortic valve replacement with bioprosthetic valve on Eliquis who presented to the emergency room on 7/3 with complaints of chest pain and found to have a non-STEMI as well as hypertensive urgency. Underwent heart catherization on 7/5 and 7/7 and PCI of SVG to diagonal.    PT Comments    Pt in bed, agrees to gait.  OOB with supervision but BM smears all over linens.  He is able to stand at bedside and perform his own care.  Sheets stripped and RN aware.  He is able to progress gait 3 laps in room with RW and min a x 1.  Generally impulsive with poor safety.  Remains in chair after session.  BP does increase wit gait 188/110.  RN aware.  After discussion with OT and primary PT, discharge recommendations changed to SNF as family does help at home and family is all currently struggling with Covid also.   Recommendations for follow up therapy are one component of a multi-disciplinary discharge planning process, led by the attending physician.  Recommendations may be updated based on patient status, additional functional criteria and insurance authorization.  Follow Up Recommendations  Skilled nursing-short term rehab (<3 hours/day)     Assistance Recommended at Discharge Frequent or constant Supervision/Assistance  Patient can return home with the following A little help with walking and/or transfers;A little help with bathing/dressing/bathroom;Assistance with cooking/housework;Direct supervision/assist for financial management;Assist for transportation;Help with stairs or ramp for entrance;Direct supervision/assist for medications management   Equipment  Recommendations  Rolling walker (2 wheels)    Recommendations for Other Services       Precautions / Restrictions Precautions Precautions: Fall Restrictions Weight Bearing Restrictions: No     Mobility  Bed Mobility Overal bed mobility: Needs Assistance Bed Mobility: Supine to Sit, Sit to Supine     Supine to sit: Supervision          Transfers Overall transfer level: Needs assistance Equipment used: None   Sit to Stand: Min guard   Step pivot transfers: Min assist            Ambulation/Gait Ambulation/Gait assistance: Min assist, Min guard Gait Distance (Feet): 80 Feet Assistive device: Rolling walker (2 wheels), None Gait Pattern/deviations: Step-through pattern, Decreased step length - right, Decreased step length - left Gait velocity: decreased     General Gait Details: generally impulsive with poor safety and occasional imbalances   Stairs             Wheelchair Mobility    Modified Rankin (Stroke Patients Only)       Balance Overall balance assessment: Needs assistance Sitting-balance support: Feet supported Sitting balance-Leahy Scale: Good       Standing balance-Leahy Scale: Fair Standing balance comment: some staggering and impulsivity impacting balance                            Cognition Arousal/Alertness: Awake/alert Behavior During Therapy: WFL for tasks assessed/performed Overall Cognitive Status: History of cognitive impairments - at baseline  Exercises Other Exercises Other Exercises: standing for pericare cue to BM smears in bed    General Comments        Pertinent Vitals/Pain Pain Assessment Pain Assessment: No/denies pain    Home Living Family/patient expects to be discharged to:: Private residence Living Arrangements: Alone Available Help at Discharge: Family;Available PRN/intermittently   Home Access: Ramped entrance       Home  Layout: One level Home Equipment: Cane - single point Additional Comments: pt endorsed no falls    Prior Function            PT Goals (current goals can now be found in the care plan section) Progress towards PT goals: Progressing toward goals    Frequency    Min 2X/week      PT Plan Discharge plan needs to be updated    Co-evaluation              AM-PAC PT "6 Clicks" Mobility   Outcome Measure  Help needed turning from your back to your side while in a flat bed without using bedrails?: None Help needed moving from lying on your back to sitting on the side of a flat bed without using bedrails?: None Help needed moving to and from a bed to a chair (including a wheelchair)?: A Little Help needed standing up from a chair using your arms (e.g., wheelchair or bedside chair)?: A Little Help needed to walk in hospital room?: A Little Help needed climbing 3-5 steps with a railing? : A Little 6 Click Score: 20    End of Session Equipment Utilized During Treatment: Gait belt Activity Tolerance: Patient tolerated treatment well Patient left: in chair;with call bell/phone within reach;with chair alarm set Nurse Communication: Mobility status;Other (comment) PT Visit Diagnosis: Other abnormalities of gait and mobility (R26.89);Difficulty in walking, not elsewhere classified (R26.2);Muscle weakness (generalized) (M62.81)     Time: 9629-5284 PT Time Calculation (min) (ACUTE ONLY): 24 min  Charges:  $Gait Training: 23-37 mins                   Chesley Noon, PTA 12/17/21, 4:26 PM

## 2021-12-17 NOTE — Progress Notes (Signed)
Cardiology Progress Note   Patient Name: Shane Holloway Date of Encounter: 12/17/2021  Primary Cardiologist: [facility]  Subjective   The patient developed fatigue, nausea and cough over the weekend and tested positive for COVID. The chart was reviewed but I did not examine him due to COVID status.  Inpatient Medications    Scheduled Meds:  amiodarone  200 mg Oral Daily   apixaban  5 mg Oral BID   atorvastatin  80 mg Oral Daily   carvedilol  12.5 mg Oral BID WC   cholecalciferol  5,000 Units Oral Daily   clopidogrel  75 mg Oral Q breakfast   cyclobenzaprine  5 mg Oral BID   dexamethasone (DECADRON) injection  8 mg Intravenous Q24H   fenofibrate  54 mg Oral Daily   finasteride  5 mg Oral Daily   insulin aspart  0-15 Units Subcutaneous TID WC   insulin aspart  0-5 Units Subcutaneous QHS   isosorbide mononitrate  30 mg Oral BID   omega-3 acid ethyl esters  2 g Oral BID   sodium bicarbonate  650 mg Oral BID   sodium chloride flush  3 mL Intravenous Q12H   sodium chloride flush  3 mL Intravenous Q12H   tamsulosin  0.4 mg Oral Daily   Continuous Infusions:  sodium chloride     sodium chloride     remdesivir 100 mg in sodium chloride 0.9 % 100 mL IVPB     PRN Meds: sodium chloride, sodium chloride, acetaminophen, alum & mag hydroxide-simeth, benzonatate, hydrALAZINE, LORazepam, menthol-cetylpyridinium, morphine injection, nitroGLYCERIN, ondansetron (ZOFRAN) IV, sodium chloride flush   Vital Signs    Vitals:   12/17/21 0000 12/17/21 0010 12/17/21 0350 12/17/21 0723  BP: (!) 138/91  (!) 139/103 (!) 150/101  Pulse: 68  64 66  Resp: '16  14 17  '$ Temp:  97.8 F (36.6 C) (!) 97.4 F (36.3 C) 98.2 F (36.8 C)  TempSrc:  Oral Oral Oral  SpO2:   100% 98%  Weight:      Height:        Intake/Output Summary (Last 24 hours) at 12/17/2021 0918 Last data filed at 12/17/2021 0851 Gross per 24 hour  Intake 6 ml  Output 2702 ml  Net -2696 ml    Filed Weights   12/10/21 1757   Weight: 105.7 kg    Physical Exam   Not performed due to testing positive for COVID.  Telemetry and chart were reviewed.  Labs    Chemistry Recent Labs  Lab 12/15/21 1352 12/16/21 0445 12/17/21 0226  NA 138 134* 136  K 3.8 3.6 4.5  CL 107 108 107  CO2 22 19* 20*  GLUCOSE 145* 96 260*  BUN 30* 32* 42*  CREATININE 3.13* 3.04* 3.12*  CALCIUM 8.9 8.3* 9.0  GFRNONAA 20* 21* 20*  ANIONGAP '9 7 9      '$ Hematology Recent Labs  Lab 12/14/21 0128 12/15/21 0448 12/16/21 0445  WBC 5.6 5.0 3.8*  RBC 4.46 4.34 4.23  HGB 12.7* 12.4* 12.1*  HCT 37.9* 37.2* 36.4*  MCV 85.0 85.7 86.1  MCH 28.5 28.6 28.6  MCHC 33.5 33.3 33.2  RDW 13.3 13.5 13.5  PLT 94* 97* 115*     Cardiac Enzymes  Recent Labs  Lab 12/10/21 1754 12/10/21 2105 12/11/21 0922  TROPONINIHS 51* 919* 15,109*       BNP    Component Value Date/Time   BNP 288.3 (H) 12/11/2021 2233   Lipids  Lab Results  Component Value  Date   CHOL 171 12/11/2021   HDL 28 (L) 12/11/2021   LDLCALC UNABLE TO CALCULATE IF TRIGLYCERIDE OVER 400 mg/dL 12/11/2021   LDLDIRECT 58.1 12/11/2021   TRIG 509 (H) 12/11/2021   CHOLHDL 6.1 12/11/2021    HbA1c  Lab Results  Component Value Date   HGBA1C 8.9 (H) 12/11/2021    Radiology    -------------  Telemetry    Sinus rhythm with PVCs.- Personally Reviewed  ECG    No new readings - Personally Reviewed  Cardiac Studies  Cardiac Catheterization 12-13-2021:  Diagnostic Dominance: Right  Severe native coronary artery disease, as detailed below. Widely patent LIMA-LAD. Patent SVG Y-graft to OM1 and D1.  There is mild diffuse disease involving the segment to OM1.  There is a focal 90% stenosis in the segment to D1 that most likely represents the culprit for the patient's NSTEMI. Patent SVG-rPDA with diffuse graft ectasia and 60% tubular stenosis in the proximal/mid graft. Upper normal left and right heart filling pressures. Discordant Fick and thermodilution cardiac  output (Fick CO/CI 4.7/2.1; thermodilution CO/CI 3.1/1.3).  Question significant TR contributing to discrepancy. _____________   2D echo 12/11/2021: 1. Left ventricular ejection fraction, by estimation, is 35 to 40%. The  left ventricle has moderately decreased function. The left ventricle has  no regional wall motion abnormalities. There is mild left ventricular  hypertrophy. Left ventricular  diastolic parameters are consistent with Grade I diastolic dysfunction  (impaired relaxation).   2. Right ventricular systolic function is normal. The right ventricular  size is normal. Tricuspid regurgitation signal is inadequate for assessing  PA pressure.   3. The mitral valve is normal in structure. No evidence of mitral valve  regurgitation. No evidence of mitral stenosis.   4. The aortic valve was not well visualized. Aortic valve regurgitation  is not visualized. No aortic stenosis is present.   5. The inferior vena cava is normal in size with greater than 50%  respiratory variability, suggesting right atrial pressure of 3 mmHg.  Patient Profile     71 y.o. male with history of CAD status post three-vessel CABG in 2009 with redo two-vessel CABG in 2015 at [facility] with SVG to OM and SVG to RCA, aortic root repair, aortic stenosis status post bioprosthetic AVR in 2015 at [facility], refractory SVT, embolic CVA, CKD stage III, cognitive impairment, left upper extremity DVT on apixaban, DM2, HTN, HLD, and BPH.  He is admitted and being treated this stay for NSTEMI.  Assessment & Plan    1.  NSTEMI/CAD : patient has history of CAD/CABG with redo in 2015.  He presented 7/3 w/ chest pain and NSTEMI.   Status post PCI and drug-eluting stent placement to SVG to diagonal on Friday. Continue  lipitor, and coreg.  Continue clopidogrel which should be used for 1 year. I discontinued aspirin today and kept him on Plavix and Eliquis to minimize the risk of bleeding.  2.  Acute HRrEF/Ischemic Cardiomyopathy:  EF prev  nl in 01/2021.  35-40% on echo this admission.  Continue coreg and Jardiance.  Lasix and losartan on hold due to renal function  ? Entresto   COnsider as outpt.   3.  PSVT: currently in NSR on telemetry.  Continue amiodarone and coreg.  4.  CKD stage III:   His creatinine is slightly above his baseline.  Nephrology is following.  Continue to monitor.    5.  HTN: BP 401U to 272Z systolic.   Follow   Hold losartan for now  given renal function      6.  Hx of DVT: Eliquis resumed     7.   COVID-19 infection: Supportive care  8 Mild cognitive impairment  9.  DM2: A1C 8.9 on 12/11/21.  Currently on glipizide, insulin aspart with meals, and Jardiance.  Management per IM team.     Signed, Kathlyn Sacramento, MD  12/17/2021, 9:18 AM

## 2021-12-17 NOTE — Evaluation (Signed)
Occupational Therapy Evaluation Patient Details Name: Shane Holloway MRN: [id number] DOB: 1950-12-05 Today's Date: 12/17/2021   History of Present Illness Patient is a 71 year old Caucasian male with a past medical history of coronary artery disease, BPH, congestive heart failure, CKD stage IV,  CVA, vascular dementia, CAD status post CABG and aortic valve replacement with bioprosthetic valve on Eliquis who presented to the emergency room on 7/3 with complaints of chest pain and found to have a non-STEMI as well as hypertensive urgency. Underwent heart catherization on 7/5 and 7/7 and PCI of SVG to diagonal.   Clinical Impression   Patient presenting with decreased Ind in self care, balance, functional mobility/transfers, endurance, and safety awareness. Pt with cognitive deficits at baseline and no family present during assessment. Per chart review, pt lives at home alone but his brother lives next door and family assists with meals, appts, meds, and IADLs. However, at this time family is very sick with covid and unable to assist at discharge per RN report. Patient currently functioning at min A and is impulsive with movement and lurches around in room. Pt standing at sink for ADL tasks with increased time and cuing to sequence tasks. Pt returning to bed at end of session with call bell and all needs within reach. Bed alarm activated.Patient will benefit from acute OT to increase overall independence in the areas of ADLs, functional mobility, and safety awareness in order to safely discharge to next venue of care.      Recommendations for follow up therapy are one component of a multi-disciplinary discharge planning process, led by the attending physician.  Recommendations may be updated based on patient status, additional functional criteria and insurance authorization.   Follow Up Recommendations  Skilled nursing-short term rehab (<3 hours/day)    Assistance Recommended at Discharge Frequent or  constant Supervision/Assistance  Patient can return home with the following A little help with walking and/or transfers;A little help with bathing/dressing/bathroom;Help with stairs or ramp for entrance;Assist for transportation;Assistance with cooking/housework;Direct supervision/assist for medications management;Direct supervision/assist for financial management    Functional Status Assessment  Patient has had a recent decline in their functional status and demonstrates the ability to make significant improvements in function in a reasonable and predictable amount of time.  Equipment Recommendations  Other (comment) (defer to next venue of care)       Precautions / Restrictions Precautions Precautions: Fall Restrictions Weight Bearing Restrictions: No      Mobility Bed Mobility Overal bed mobility: Needs Assistance Bed Mobility: Supine to Sit, Sit to Supine     Supine to sit: Supervision Sit to supine: Supervision        Transfers Overall transfer level: Needs assistance Equipment used: None Transfers: Sit to/from Stand, Bed to chair/wheelchair/BSC Sit to Stand: Min guard     Step pivot transfers: Min assist            Balance Overall balance assessment: Needs assistance Sitting-balance support: Feet supported Sitting balance-Leahy Scale: Good       Standing balance-Leahy Scale: Fair Standing balance comment: some staggering and impulsivity impacting balance                           ADL either performed or assessed with clinical judgement   ADL Overall ADL's : Needs assistance/impaired     Grooming: Wash/dry hands;Wash/dry face;Oral care;Standing;Minimal assistance               Lower Body Dressing: Minimal  assistance;Sit to/from stand   Toilet Transfer: Minimal assistance           Functional mobility during ADLs: Minimal assistance       Vision Patient Visual Report: No change from baseline              Pertinent  Vitals/Pain Pain Assessment Pain Assessment: No/denies pain     Hand Dominance Right   Extremity/Trunk Assessment Upper Extremity Assessment Upper Extremity Assessment: Overall WFL for tasks assessed   Lower Extremity Assessment Lower Extremity Assessment: Generalized weakness   Cervical / Trunk Assessment Cervical / Trunk Assessment: Normal   Communication Communication Communication: No difficulties   Cognition Arousal/Alertness: Awake/alert Behavior During Therapy: Impulsive Overall Cognitive Status: History of cognitive impairments - at baseline                                 General Comments: Pt is oriented to location and situation (sick in hospital) but not oriented to time. Pt needing cuing to sequence routine tasks during session .                Home Living Family/patient expects to be discharged to:: Private residence Living Arrangements: Alone Available Help at Discharge: Family;Available PRN/intermittently   Home Access: Ramped entrance     Home Layout: One level               Home Equipment: Cane - single point   Additional Comments: pt endorsed no falls      Prior Functioning/Environment Prior Level of Function : Needs assist;Patient poor historian/Family not available             Mobility Comments: no AD at baseline ADLs Comments: family assists wtih IADLs (meals and med management from brother that lives next door)        OT Problem List: Decreased strength;Cardiopulmonary status limiting activity;Decreased activity tolerance;Decreased safety awareness;Impaired balance (sitting and/or standing);Decreased knowledge of use of DME or AE      OT Treatment/Interventions: Self-care/ADL training;Therapeutic exercise;Therapeutic activities;Energy conservation;DME and/or AE instruction;Patient/family education;Manual therapy;Balance training    OT Goals(Current goals can be found in the care plan section) Acute Rehab OT  Goals Patient Stated Goal: to go home OT Goal Formulation: With patient Time For Goal Achievement: 12/31/21 Potential to Achieve Goals: Good ADL Goals Pt Will Perform Grooming: standing;with supervision Pt Will Perform Lower Body Dressing: with supervision;sit to/from stand Pt Will Transfer to Toilet: with supervision;ambulating Pt Will Perform Toileting - Clothing Manipulation and hygiene: with supervision;sit to/from stand Pt/caregiver will Perform Home Exercise Program: Increased strength;Both right and left upper extremity;With theraband;With written HEP provided;With Supervision  OT Frequency: Min 2X/week       AM-PAC OT "6 Clicks" Daily Activity     Outcome Measure   Help from another person taking care of personal grooming?: None Help from another person toileting, which includes using toliet, bedpan, or urinal?: A Little Help from another person bathing (including washing, rinsing, drying)?: A Little Help from another person to put on and taking off regular upper body clothing?: A Little Help from another person to put on and taking off regular lower body clothing?: A Little 6 Click Score: 16   End of Session Equipment Utilized During Treatment: Oxygen Nurse Communication: Mobility status  Activity Tolerance: Patient tolerated treatment well Patient left: in bed;with call bell/phone within reach;with bed alarm set  OT Visit Diagnosis: Unsteadiness on feet (R26.81);Repeated falls (R29.6);Muscle weakness (  generalized) (M62.81)                Time: 4834-7583 OT Time Calculation (min): 22 min Charges:  OT General Charges $OT Visit: 1 Visit OT Evaluation $OT Eval Moderate Complexity: 1 Mod OT Treatments $Self Care/Home Management : 8-22 mins  Darleen Crocker, MS, OTR/L , CBIS ascom [phone]  12/17/21, 1:59 PM

## 2021-12-17 NOTE — Progress Notes (Addendum)
Progress Note   Patient: Shane Holloway UXL:[id number] DOB: [birth year] DOA: 12/10/2021     7 DOS: the patient was seen and examined on 12/17/2021   Brief hospital course: 71 year old male with past medical history of BPH, CHF, CKD, vascular dementia, CAD status post CABG and aortic valve replacement with bioprosthetic valve on Eliquis who presented to the emergency room on 7/3 with complaints of chest pain and found to have a non-STEMI as well as hypertensive urgency.  Patient was admitted to the hospitalist service and cardiology was consulted.  Eliquis stopped and started on heparin.  Echocardiogram done noted ejection fraction of 35% and grade 1 diastolic dysfunction.  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  Patient underwent interventional catheterization on 7/7 with cardiology doing an IV ultrasound-guided PCI of the SVG to the diagonal. Patient had worsening renal function on 7/8.  Started fluids. 7/9.  Renal function starting getting better, however, patient developed significant cough, COVID test come back positive.  Started steroids.  Assessment and Plan: Non-STEMI. Hypertension urgency. Patient is status post cardiac cath, with stent placement to SVG to diagonal.  Continue dual antiplatelet treatment with aspirin Plavix, statin. Condition has stabilized.   Acute hypoxemic respiratory failure. COVID infection. Reviewed chest x-ray, no evidence of pneumonia or volume overload.  Patient still requiring 2 L oxygen, will wean her off as patient condition is improving.  We will finish the course of remdesivir, continue IV steroids.   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 Reviewed chart, patient had volume overload and difficult for heart cath.  Patient was receiving Lasix.  This qualify for acute on chronic systolic congestive heart failure.   Condition has improved.  Acute kidney injury on chronic kidney disease stage IIIb. Hypokalemia Metabolic acidosis. Continue sodium bicarb.  Renal function is worsening again, patient is followed by nephrology, this is most likely caused by IV contrast.   Uncontrolled type 2 diabetes with hyperglycemia. No change in treatment plan   Thrombocytopenia. Likely due to COVID, stable   Obesity with BMI 30.32.      Subjective:  Patient feels better, no significant shortness of breath. No diarrhea or nausea vomiting.  Physical Exam: Vitals:   12/17/21 0010 12/17/21 0350 12/17/21 0723 12/17/21 1131  BP:  (!) 139/103 (!) 150/101 (!) 140/91  Pulse:  64 66 65  Resp:  '14 17 17  '$ Temp: 97.8 F (36.6 C) (!) 97.4 F (36.3 C) 98.2 F (36.8 C) 98.2 F (36.8 C)  TempSrc: Oral Oral Oral Oral  SpO2:  100% 98% 99%  Weight:      Height:       General exam: Appears calm and comfortable  Respiratory system: Clear to auscultation. Respiratory effort normal. Cardiovascular system: S1 & S2 heard, RRR. No JVD, murmurs, rubs, gallops or clicks. No pedal edema. Gastrointestinal system: Abdomen is nondistended, soft and nontender. No organomegaly or masses felt. Normal bowel sounds heard. Central nervous system: Alert and oriented. No focal neurological deficits. Extremities: Symmetric 5 x 5 power. Skin: No rashes, lesions or ulcers Psychiatry: Judgement and insight appear normal. Mood & affect appropriate.   Data Reviewed:  Lab results reviewed  Family Communication:   Disposition: Status is: Inpatient Remains inpatient appropriate because: Severity of disease, IV treatment.  Planned Discharge Destination: Home with Home Health    Time spent: 35 minutes  Author: Sharen Hones, MD 12/17/2021 12:39 PM  For on call review [email or web address].

## 2021-12-17 NOTE — Inpatient Diabetes Management (Signed)
Inpatient Diabetes Program Recommendations  AACE/ADA: New Consensus Statement on Inpatient Glycemic Control   Target Ranges:  Prepandial:   less than 140 mg/dL      Peak postprandial:   less than 180 mg/dL (1-2 hours)      Critically ill patients:  140 - 180 mg/dL    Latest Reference Range & Units 12/16/21 08:23 12/16/21 12:22 12/16/21 15:36 12/16/21 21:26 12/17/21 07:27  Glucose-Capillary 70 - 99 mg/dL 92 119 (H) 153 (H) 263 (H) 238 (H)   Review of Glycemic Control  Current orders for Inpatient glycemic control: Novolog 0-15 units TID with meals, Novolog 0-5 units QHS; Decadron 8 mg Q24H  Inpatient Diabetes Program Recommendations:    Insulin: If steroids are continued as ordered, please consider ordering Semglee 8 units Q24H.  Thanks, Barnie Alderman, RN, MSN, [place] [facility] Coordinator Inpatient Diabetes Program [phone] (Team Pager from 8am to [place])

## 2021-12-17 NOTE — Progress Notes (Signed)
Shane Holloway  MRN: [id number]  DOB/AGE: 1951/04/28 71 y.o.  Primary Care Physician:Gladman, Okey Dupre, MD  Admit date: 12/10/2021  Chief Complaint:  Chief Complaint  Patient presents with   Chest Pain    S-Pt presented on  12/10/2021 with  Chief Complaint  Patient presents with   Chest Pain  . Patient seen resting quietly  Denies pain and discomfort Tolerating small meals Remains on nasal cannula  Creatinine 3.12 UOP 3.1L in past 24 hours    amiodarone  200 mg Oral Daily   apixaban  5 mg Oral BID   atorvastatin  80 mg Oral Daily   carvedilol  12.5 mg Oral BID WC   cholecalciferol  5,000 Units Oral Daily   clopidogrel  75 mg Oral Q breakfast   cyclobenzaprine  5 mg Oral BID   dexamethasone (DECADRON) injection  8 mg Intravenous Q24H   fenofibrate  54 mg Oral Daily   finasteride  5 mg Oral Daily   insulin aspart  0-15 Units Subcutaneous TID WC   insulin aspart  0-5 Units Subcutaneous QHS   isosorbide mononitrate  30 mg Oral BID   omega-3 acid ethyl esters  2 g Oral BID   sodium bicarbonate  650 mg Oral BID   sodium chloride flush  3 mL Intravenous Q12H   sodium chloride flush  3 mL Intravenous Q12H   tamsulosin  0.4 mg Oral Daily         MWU:XLKGM from the symptoms mentioned above,there are no other symptoms referable to all systems reviewed.  Physical Exam: Vital signs in last 24 hours: Temp:  [97.4 F (36.3 C)-98.7 F (37.1 C)] 98.2 F (36.8 C) (07/10 1131) Pulse Rate:  [64-72] 65 (07/10 1131) Resp:  [14-20] 17 (07/10 1131) BP: (117-150)/(84-103) 140/91 (07/10 1131) SpO2:  [90 %-100 %] 99 % (07/10 1131) Weight change:  Last BM Date : 12/16/21  Intake/Output from previous day: 07/09 0701 - 07/10 0700 In: -  Out: 3127 [Urine:3125; Stool:2] Total I/O In: 139.1 [I.V.:39.1; IV Piggyback:100] Out: 400 [Urine:400]   Physical Exam:  General- pt is awake,alert, oriented to time place and person  Resp- No acute REsp distress, CTA B/L NO  Rhonchi  CVS- S1S2 regular in rate and rhythm  GIT- BS+, soft, Non tender , Non distended  EXT- No LE Edema,  No Cyanosis    Lab Results:  CBC  Recent Labs    12/15/21 0448 12/16/21 0445  WBC 5.0 3.8*  HGB 12.4* 12.1*  HCT 37.2* 36.4*  PLT 97* 115*     BMET  Recent Labs    12/16/21 0445 12/17/21 0226  NA 134* 136  K 3.6 4.5  CL 108 107  CO2 19* 20*  GLUCOSE 96 260*  BUN 32* 42*  CREATININE 3.04* 3.12*  CALCIUM 8.3* 9.0       Most recent Creatinine trend  Lab Results  Component Value Date   CREATININE 3.12 (H) 12/17/2021   CREATININE 3.04 (H) 12/16/2021   CREATININE 3.13 (H) 12/15/2021       MICRO   Recent Results (from the past 240 hour(s))  SARS Coronavirus 2 by RT PCR (hospital order, performed in [facility] hospital lab) *cepheid single result test* Anterior Nasal Swab     Status: Abnormal   Collection Time: 12/16/21  8:49 AM   Specimen: Anterior Nasal Swab  Result Value Ref Range Status   SARS Coronavirus 2 by RT PCR POSITIVE (A) NEGATIVE Final    Comment: (NOTE)  SARS-CoV-2 target nucleic acids are DETECTED  SARS-CoV-2 RNA is generally detectable in upper respiratory specimens  during the acute phase of infection.  Positive results are indicative  of the presence of the identified virus, but do not rule out bacterial infection or co-infection with other pathogens not detected by the test.  Clinical correlation with patient history and  other diagnostic information is necessary to determine patient infection status.  The expected result is negative.  Fact Sheet for Patients:   https://www.patel.info/   Fact Sheet for Healthcare Providers:   https://hall.com/    This test is not yet approved or cleared by the [place] FDA and  has been authorized for detection and/or diagnosis of SARS-CoV-2 by FDA under an Emergency Use Authorization (EUA).  This EUA will remain in effect (meaning this  test can be used) for the duration of  the COVID-19 declaration under Section 564(b)(1)  of the Act, 21 U.S.C. section 360-bbb-3(b)(1), unless the authorization is terminated or revoked sooner.   Performed at [facility], [address] [place], [place] [address]          Impression:   1)  AKI on CKD stage IV Patient could have contrast-induced nephropathy versus Cholesterol embolization.  Baseline creatinine appears to be 3.0--3.1 Patient has acute kidney injury secondary to ATN Patient has AKI secondary to IV contrast/SGLT2 receptor blockers on board/patient was on RAS blockers as an outpatient Patient was on diuretics as an outpatient Patient has CKD most likely secondary to diabetes mellitus Renal function continues to fluctuate but urine output adequate at 3.1L. Will continue to monitor renal function.    2)HTN, Home regimen includes amlodipine, furosemide, losartan, and metoprolol. Receiving Carvedilol, isosorbide and PRN Hydralazine.  Blood pressure 140/91   3)Anemia of chronic disease/Iron deficiency anemia     Latest Ref Rng & Units 12/16/2021    4:45 AM 12/15/2021    4:48 AM 12/14/2021    1:28 AM  CBC  WBC 4.0 - 10.5 K/uL 3.8  5.0  5.6   Hemoglobin 13.0 - 17.0 g/dL 12.1  12.4  12.7   Hematocrit 39.0 - 52.0 % 36.4  37.2  37.9   Platelets 150 - 400 K/uL 115  97  94       Latest Reference Range & Units 12/15/21 13:52  Iron 45 - 182 ug/dL 26 (L)  UIBC ug/dL 205  TIBC 250 - 450 ug/dL 231 (L)  Saturation Ratios 17.9 - 39.5 % 11 (L)  Ferritin 24 - 336 ng/mL 244  Folate >5.9 ng/mL 21.5  (L): Data is abnormally low  HGb at goal (9--11)  Received iron supplementation yesterday   4) Secondary hyperparathyroidism -CKD Mineral-Bone Disorder Lab Results  Component Value Date   CALCIUM 9.0 12/17/2021   PHOS 2.7 12/15/2021   Calcium and phosphorus at desired goal, will continue to monitor   5)Thrombocytopenia Improved Patient is being followed with  the primary team   6) acute metabolic acidosis Improving, will continue to monitor   [facility] 12/17/2021, 2:14 PM

## 2021-12-17 NOTE — Plan of Care (Signed)
  Problem: Activity: Goal: Ability to tolerate increased activity will improve Outcome: Progressing   Problem: Activity: Goal: Risk for activity intolerance will decrease Outcome: Progressing   Problem: Coping: Goal: Level of anxiety will decrease Outcome: Progressing   Problem: Elimination: Goal: Will not experience complications related to bowel motility Outcome: Progressing Goal: Will not experience complications related to urinary retention Outcome: Progressing   Problem: Pain Managment: Goal: General experience of comfort will improve Outcome: Progressing   Problem: Safety: Goal: Ability to remain free from injury will improve Outcome: Progressing

## 2021-12-18 DIAGNOSIS — I255 Ischemic cardiomyopathy: Secondary | ICD-10-CM | POA: Diagnosis not present

## 2021-12-18 DIAGNOSIS — I214 Non-ST elevation (NSTEMI) myocardial infarction: Secondary | ICD-10-CM | POA: Diagnosis not present

## 2021-12-18 DIAGNOSIS — N170 Acute kidney failure with tubular necrosis: Secondary | ICD-10-CM | POA: Diagnosis not present

## 2021-12-18 DIAGNOSIS — I5020 Unspecified systolic (congestive) heart failure: Secondary | ICD-10-CM | POA: Diagnosis not present

## 2021-12-18 DIAGNOSIS — N179 Acute kidney failure, unspecified: Secondary | ICD-10-CM

## 2021-12-18 DIAGNOSIS — J9601 Acute respiratory failure with hypoxia: Secondary | ICD-10-CM | POA: Diagnosis not present

## 2021-12-18 DIAGNOSIS — N189 Chronic kidney disease, unspecified: Secondary | ICD-10-CM

## 2021-12-18 DIAGNOSIS — N1832 Chronic kidney disease, stage 3b: Secondary | ICD-10-CM | POA: Diagnosis not present

## 2021-12-18 LAB — GLUCOSE, CAPILLARY
Glucose-Capillary: 258 mg/dL — ABNORMAL HIGH (ref 70–99)
Glucose-Capillary: 291 mg/dL — ABNORMAL HIGH (ref 70–99)
Glucose-Capillary: 325 mg/dL — ABNORMAL HIGH (ref 70–99)
Glucose-Capillary: 325 mg/dL — ABNORMAL HIGH (ref 70–99)
Glucose-Capillary: 363 mg/dL — ABNORMAL HIGH (ref 70–99)

## 2021-12-18 LAB — BASIC METABOLIC PANEL
Anion gap: 10 (ref 5–15)
BUN: 54 mg/dL — ABNORMAL HIGH (ref 8–23)
CO2: 20 mmol/L — ABNORMAL LOW (ref 22–32)
Calcium: 9.4 mg/dL (ref 8.9–10.3)
Chloride: 107 mmol/L (ref 98–111)
Creatinine, Ser: 2.98 mg/dL — ABNORMAL HIGH (ref 0.61–1.24)
GFR, Estimated: 21 mL/min — ABNORMAL LOW (ref >60)
Glucose, Bld: 297 mg/dL — ABNORMAL HIGH (ref 70–99)
Potassium: 4.4 mmol/L (ref 3.5–5.1)
Sodium: 137 mmol/L (ref 135–145)

## 2021-12-18 LAB — HOMOCYSTEINE: Homocysteine: 24.4 umol/L — ABNORMAL HIGH (ref 0.0–19.2)

## 2021-12-18 MED ORDER — INSULIN GLARGINE-YFGN 100 UNIT/ML ~~LOC~~ SOLN
8.0000 [IU] | Freq: Every day | SUBCUTANEOUS | Status: DC
  Administered 2021-12-18 – 2021-12-19 (×2): 8 [IU] via SUBCUTANEOUS
  Filled 2021-12-18 (×2): qty 0.08

## 2021-12-18 NOTE — Progress Notes (Signed)
Physical Therapy Treatment Patient Details Name: Shane Holloway MRN: [id number] DOB: 24-May-1951 Today's Date: 12/18/2021   History of Present Illness Patient is a 71 year old Caucasian male with a past medical history of coronary artery disease, BPH, congestive heart failure, CKD stage IV,  CVA, vascular dementia, CAD status post CABG and aortic valve replacement with bioprosthetic valve on Eliquis who presented to the emergency room on 7/3 with complaints of chest pain and found to have a non-STEMI as well as hypertensive urgency. Underwent heart catherization on 7/5 and 7/7 and PCI of SVG to diagonal.    PT Comments    Pt ready for session.  OOB and walks several laps in room with RW.  He does trial gait without AD but has 2 LOB's with min a to recover and agrees RW is better at this time.  Pt inc BM and is cued to stand to provide his own care.  He does state he wears briefs at home to control.    Pt continues to need +1 supervision/assist.  Pt does have support from family but is unsure if they can continue at this time due to their own Covid infections.  If pt does progress and family is able to care for him, transition to home would be feasible with RW.   Recommendations for follow up therapy are one component of a multi-disciplinary discharge planning process, led by the attending physician.  Recommendations may be updated based on patient status, additional functional criteria and insurance authorization.  Follow Up Recommendations  Skilled nursing-short term rehab (<3 hours/day)     Assistance Recommended at Discharge Frequent or constant Supervision/Assistance  Patient can return home with the following A little help with walking and/or transfers;A little help with bathing/dressing/bathroom;Assistance with cooking/housework;Direct supervision/assist for financial management;Assist for transportation;Help with stairs or ramp for entrance;Direct supervision/assist for medications  management   Equipment Recommendations  Rolling walker (2 wheels)    Recommendations for Other Services       Precautions / Restrictions Precautions Precautions: Fall Restrictions Weight Bearing Restrictions: No     Mobility  Bed Mobility Overal bed mobility: Needs Assistance Bed Mobility: Supine to Sit     Supine to sit: Supervision          Transfers Overall transfer level: Needs assistance Equipment used: Rolling walker (2 wheels) Transfers: Sit to/from Stand Sit to Stand: Min guard                Ambulation/Gait Ambulation/Gait assistance: Min assist, Min guard Gait Distance (Feet): 100 Feet Assistive device: Rolling walker (2 wheels), None Gait Pattern/deviations: Step-through pattern, Decreased step length - right, Decreased step length - left Gait velocity: decreased     General Gait Details: unsteady without AD.  much better with RW   Stairs             Wheelchair Mobility    Modified Rankin (Stroke Patients Only)       Balance Overall balance assessment: Needs assistance Sitting-balance support: Feet supported Sitting balance-Leahy Scale: Good       Standing balance-Leahy Scale: Fair Standing balance comment: some staggering and impulsivity impacting balance, min a to recover                            Cognition Arousal/Alertness: Awake/alert Behavior During Therapy: WFL for tasks assessed/performed Overall Cognitive Status: History of cognitive impairments - at baseline  Exercises Other Exercises Other Exercises: standing for pericare cue to BM smears in bed    General Comments        Pertinent Vitals/Pain      Home Living                          Prior Function            PT Goals (current goals can now be found in the care plan section) Progress towards PT goals: Progressing toward goals    Frequency    Min  2X/week      PT Plan Current plan remains appropriate;Other (comment)    Co-evaluation              AM-PAC PT "6 Clicks" Mobility   Outcome Measure  Help needed turning from your back to your side while in a flat bed without using bedrails?: None Help needed moving from lying on your back to sitting on the side of a flat bed without using bedrails?: None Help needed moving to and from a bed to a chair (including a wheelchair)?: None Help needed standing up from a chair using your arms (e.g., wheelchair or bedside chair)?: A Little Help needed to walk in hospital room?: A Little Help needed climbing 3-5 steps with a railing? : A Little 6 Click Score: 21    End of Session Equipment Utilized During Treatment: Gait belt Activity Tolerance: Patient tolerated treatment well Patient left: in chair;with call bell/phone within reach;with chair alarm set Nurse Communication: Mobility status;Other (comment) PT Visit Diagnosis: Other abnormalities of gait and mobility (R26.89);Difficulty in walking, not elsewhere classified (R26.2);Muscle weakness (generalized) (M62.81)     Time: 8242-3536 PT Time Calculation (min) (ACUTE ONLY): 24 min  Charges:  $Gait Training: 8-22 mins $Therapeutic Activity: 8-22 mins                     Chesley Noon, PTA 12/18/21, 1:15 PM

## 2021-12-18 NOTE — TOC Initial Note (Signed)
Transition of Care [facility]) - Initial/Assessment Note    Patient Details  Name: Shane Holloway MRN: [id number] Date of Birth: [birth year]  Transition of Care [facility]) CM/SW Contact:    Alberteen Sam, LCSW Phone Number: 12/18/2021, 12:18 PM  Clinical Narrative:                  CSW spoke with patient regarding SNF and answered his questions, at this time he reports he really wants to go home and work towards going home during his stay here.   MD updated.  Expected Discharge Plan: [place] Barriers to Discharge: Continued Medical Work up   Patient Goals and CMS Choice Patient states their goals for this hospitalization and ongoing recovery are:: to go home CMS Medicare.gov Compare Post Acute Care list provided to:: Patient Choice offered to / list presented to : Patient  Expected Discharge Plan and Services Expected Discharge Plan: [place]       Living arrangements for the past 2 months: Single Family Home                                      Prior Living Arrangements/Services Living arrangements for the past 2 months: Single Family Home Lives with:: Self                   Activities of Daily Living Home Assistive Devices/Equipment: None ADL Screening (condition at time of admission) Patient's cognitive ability adequate to safely complete daily activities?: Yes Is the patient deaf or have difficulty hearing?: No Does the patient have difficulty seeing, even when wearing glasses/contacts?: No Does the patient have difficulty concentrating, remembering, or making decisions?: No Patient able to express need for assistance with ADLs?: Yes Does the patient have difficulty dressing or bathing?: No Independently performs ADLs?: Yes (appropriate for developmental age) Does the patient have difficulty walking or climbing stairs?: No Weakness of Legs: None Weakness of Arms/Hands: None  Permission Sought/Granted                   Emotional Assessment              Admission diagnosis:  NSTEMI (non-ST elevated myocardial infarction) ([place]) [I21.4] Patient Active Problem List   Diagnosis Date Noted   Thrombocytopenia ([place]) 12/16/2021   COVID-19 virus infection 12/16/2021   Acute respiratory failure with hypoxia ([place]) 12/16/2021   Acute renal failure with acute tubular necrosis superimposed on stage 3b chronic kidney disease ([place]) 12/15/2021   Obesity (BMI 30-39.9) 12/11/2021   NSTEMI (non-ST elevated myocardial infarction) ([place]) 12/10/2021   Hypertensive urgency 12/10/2021   Chronic kidney disease, stage 3b ([place]) 12/10/2021   SVT (supraventricular tachycardia) ([place]) 03/20/2021   S/P aortic valve replacement with bioprosthetic valve 02/22/2021   Vascular dementia without behavioral disturbance ([place]) 09/13/2020   Stage 3 chronic kidney disease ([place]) 04/20/2017   History of stroke 02/22/2014   Benign prostatic hyperplasia 04/16/2012   Uncontrolled type 2 diabetes mellitus with hyperglycemia, without long-term current use of insulin ([place]) 06/27/2009   CORONARY ATHEROSCLEROSIS, ARTERY BYPASS GRAFT 06/27/2009   Essential hypertension 11/24/2008   Acute on chronic combined systolic and diastolic CHF (congestive heart failure) ([place]) 11/24/2008   DYSPNEA 11/24/2008   PCP:  Caryl Never, MD Pharmacy:   [facility] ([place]), [place] - [address] [place] (  [place]) [place] [address] Phone: [phone] Fax: [phone]  [facility] [address], [place] [address] [place] [address] Phone: [phone] Fax: [place], [place] [address] Phone: [phone] Fax: [phone]     Social Determinants of Health (SDOH) Interventions    Readmission Risk Interventions     No data to display

## 2021-12-18 NOTE — Plan of Care (Signed)

## 2021-12-18 NOTE — Progress Notes (Signed)
Progress Note  Patient Name: Shane Holloway Date of Encounter: 12/18/2021  [facility] Cardiologist: Bernell List, MD [facility])  Subjective   No chest pain or shortness of breath.  Still feels weak.  Inpatient Medications    Scheduled Meds:  amiodarone  200 mg Oral Daily   apixaban  5 mg Oral BID   atorvastatin  80 mg Oral Daily   carvedilol  12.5 mg Oral BID WC   cholecalciferol  5,000 Units Oral Daily   clopidogrel  75 mg Oral Q breakfast   cyclobenzaprine  5 mg Oral BID   dexamethasone (DECADRON) injection  8 mg Intravenous Q24H   fenofibrate  54 mg Oral Daily   finasteride  5 mg Oral Daily   insulin aspart  0-15 Units Subcutaneous TID WC   insulin aspart  0-5 Units Subcutaneous QHS   isosorbide mononitrate  30 mg Oral BID   omega-3 acid ethyl esters  2 g Oral BID   sodium bicarbonate  650 mg Oral BID   sodium chloride flush  3 mL Intravenous Q12H   sodium chloride flush  3 mL Intravenous Q12H   tamsulosin  0.4 mg Oral Daily   Continuous Infusions:  sodium chloride     sodium chloride 250 mL (12/18/21 0947)   remdesivir 100 mg in sodium chloride 0.9 % 100 mL IVPB 100 mg (12/18/21 0949)   PRN Meds: sodium chloride, sodium chloride, acetaminophen, alum & mag hydroxide-simeth, benzonatate, hydrALAZINE, LORazepam, menthol-cetylpyridinium, morphine injection, nitroGLYCERIN, ondansetron (ZOFRAN) IV, sodium chloride flush   Vital Signs    Vitals:   12/17/21 1958 12/18/21 0157 12/18/21 0624 12/18/21 0843  BP: 128/89 (!) 146/97 135/88 129/87  Pulse: 65 66 65 60  Resp: '18 18 18 17  '$ Temp: 97.6 F (36.4 C) 97.9 F (36.6 C) 97.6 F (36.4 C) 97.7 F (36.5 C)  TempSrc: Oral Oral Oral Oral  SpO2: 97% 96% 96% 96%  Weight:      Height:        Intake/Output Summary (Last 24 hours) at 12/18/2021 0959 Last data filed at 12/18/2021 0944 Gross per 24 hour  Intake 136.07 ml  Output 2250 ml  Net -2113.93 ml      12/10/2021    5:57 PM 12/03/2021    6:43 AM 11/19/2021     6:40 AM  Last 3 Weights  Weight (lbs) 233 lb 230 lb 234 lb  Weight (kg) 105.688 kg 104.327 kg 106.142 kg      Telemetry    Sinus rhythm - Personally Reviewed  ECG   No new tracing.  Physical Exam   GEN: No acute distress.   Neck: No JVD Cardiac: RRR, 1/6 systolic murmur Respiratory: Clear to auscultation bilaterally. GI: Soft, nontender, non-distended  MS: No edema; No deformity. Neuro:  Nonfocal  Psych: Normal affect   Labs    High Sensitivity Troponin:   Recent Labs  Lab 12/10/21 1754 12/10/21 2105 12/11/21 0922  TROPONINIHS 51* 919* 15,109*     Chemistry Recent Labs  Lab 12/15/21 1352 12/16/21 0445 12/17/21 0226 12/18/21 0347  NA 138 134* 136 137  K 3.8 3.6 4.5 4.4  CL 107 108 107 107  CO2 22 19* 20* 20*  GLUCOSE 145* 96 260* 297*  BUN 30* 32* 42* 54*  CREATININE 3.13* 3.04* 3.12* 2.98*  CALCIUM 8.9 8.3* 9.0 9.4  MG 2.1  --  2.2  --   GFRNONAA 20* 21* 20* 21*  ANIONGAP '9 7 9 10    '$ Lipids  No results for input(s): "CHOL", "TRIG", "HDL", "LABVLDL", "LDLCALC", "CHOLHDL" in the last 168 hours.  Hematology Recent Labs  Lab 12/14/21 0128 12/15/21 0448 12/16/21 0445  WBC 5.6 5.0 3.8*  RBC 4.46 4.34 4.23  HGB 12.7* 12.4* 12.1*  HCT 37.9* 37.2* 36.4*  MCV 85.0 85.7 86.1  MCH 28.5 28.6 28.6  MCHC 33.5 33.3 33.2  RDW 13.3 13.5 13.5  PLT 94* 97* 115*   Thyroid No results for input(s): "TSH", "FREET4" in the last 168 hours.  BNP Recent Labs  Lab 12/11/21 2233  BNP 288.3*    DDimer No results for input(s): "DDIMER" in the last 168 hours.   Radiology    DG Chest Port 1 View  Result Date: 12/16/2021 CLINICAL DATA:  COVID positive.  Chest pain. EXAM: PORTABLE CHEST 1 VIEW COMPARISON:  12/10/2021. FINDINGS: Stable changes from prior cardiac surgery and valve replacement. Cardiac silhouette is normal in size. No mediastinal or hilar masses. Clear lungs.  No pleural effusion or pneumothorax. Skeletal structures are grossly intact. IMPRESSION: No  active disease. Electronically Signed   By: Lajean Manes M.D.   On: 12/16/2021 14:46    Cardiac Studies   PCI to the SVG-D (12/14/2021): Successful IVUS guided direct stenting of SVG to diagonal.  RHC/coronary angiography (12/12/2021): Severe native coronary artery disease, as detailed below. Widely patent LIMA-LAD. Patent SVG Y-graft to OM1 and D1.  There is mild diffuse disease involving the segment to OM1.  There is a focal 90% stenosis in the segment to D1 that most likely represents the culprit for the patient's NSTEMI. Patent SVG-rPDA with diffuse graft ectasia and 60% tubular stenosis in the proximal/mid graft. Upper normal left and right heart filling pressures. Discordant Fick and thermodilution cardiac output (Fick CO/CI 4.7/2.1; thermodilution CO/CI 3.1/1.3).  Question significant TR contributing to discrepancy.  TTE (12/11/2021):  1. Left ventricular ejection fraction, by estimation, is 35 to 40%. The  left ventricle has moderately decreased function. The left ventricle has  no regional wall motion abnormalities. There is mild left ventricular  hypertrophy. Left ventricular  diastolic parameters are consistent with Grade I diastolic dysfunction  (impaired relaxation).   2. Right ventricular systolic function is normal. The right ventricular  size is normal. Tricuspid regurgitation signal is inadequate for assessing  PA pressure.   3. The mitral valve is normal in structure. No evidence of mitral valve  regurgitation. No evidence of mitral stenosis.   4. The aortic valve was not well visualized. Aortic valve regurgitation  is not visualized. No aortic stenosis is present.   5. The inferior vena cava is normal in size with greater than 50%  respiratory variability, suggesting right atrial pressure of 3 mmHg.   Patient Profile     71 y.o. male man with history of CAD status post three-vessel CABG in 2009 with redo two-vessel CABG in [facility] with SVG to OM and SVG to RCA,  aortic root repair, aortic stenosis status post bioprosthetic AVR in 2015 at [facility], refractory SVT, embolic CVA, CKD stage III, cognitive impairment, left upper extremity DVT on apixaban, DM2, HTN, HLD, and BPH, admitted with NSTEMI.  Assessment & Plan    NSTEMI: No further chest pain reported status post PCI to SVG-D1 on Friday. -Continue apixaban and clopidogrel for at least 12 months, after which time clopidogrel could be stopped. -Continue atorvastatin and fenofibrate. -Outpatient cardiac rehab referral has been placed. -Continue antianginal therapy with isosorbide mononitrate, as blood pressure allows.  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  Mr. Batson appears euvolemic on examination today. -Continue carvedilol and dapagliflozin. -Furosemide and losartan on hold due to acute kidney injury superimposed on chronic kidney disease.  Consider reinitiation as an outpatient.  PSVT: Patient is maintaining sinus rhythm. -Continue amiodarone and carvedilol.  Acute kidney injury superimposed on chronic kidney disease: Creatinine slightly better today but still above baseline. -Maintain net even fluid balance. -Avoid nephrotoxic drugs.  History of upper extremity DVT: -Continue apixaban.  COVID-19 infection: Symptoms improved. -Continue remdesivir per internal medicine.  [facility] will sign off.   Medication Recommendations:  See above. Other recommendations (labs, testing, etc):  None Follow up as an outpatient:  Follow-up with primary cardiology team at [facility] in ~2 weeks after discharge.  I have left a message with his provider at [facility] to discuss [facility] hospitalization and to facilitate follow-up.  For questions or updates, please contact [place] Please consult [email or web address] for contact info under        Signed, Nelva Bush, MD  12/18/2021, 9:59 AM

## 2021-12-18 NOTE — Inpatient Diabetes Management (Signed)
Inpatient Diabetes Program Recommendations  AACE/ADA: New Consensus Statement on Inpatient Glycemic Control   Target Ranges:  Prepandial:   less than 140 mg/dL      Peak postprandial:   less than 180 mg/dL (1-2 hours)      Critically ill patients:  140 - 180 mg/dL    Latest Reference Range & Units 12/17/21 07:27 12/17/21 11:36 12/17/21 15:16 12/17/21 21:18 12/18/21 08:43  Glucose-Capillary 70 - 99 mg/dL 238 (H) 299 (H) 254 (H) 293 (H) 258 (H)   Review of Glycemic Control  Current orders for Inpatient glycemic control: Novolog 0-15 units TID with meals, Novolog 0-5 units QHS; Decadron 8 mg Q24H   Inpatient Diabetes Program Recommendations:     Insulin: If steroids are continued as ordered, please consider ordering Semglee 8 units Q24H.   Thanks, Barnie Alderman, RN, MSN, [place] Diabetes Coordinator Inpatient Diabetes Program [phone] (Team Pager from 8am to [place])

## 2021-12-18 NOTE — Progress Notes (Signed)
  Progress Note   Patient: Shane Holloway [id number]:[id number] DOB: Apr 02, 1951 DOA: 12/10/2021     8 DOS: the patient was seen and examined on 12/18/2021   Brief hospital course: 71 year old male with past medical history of BPH, CHF, CKD, vascular dementia, CAD status post CABG and aortic valve replacement with bioprosthetic valve on Eliquis who presented to the emergency room on 7/3 with complaints of chest pain and found to have a non-STEMI as well as hypertensive urgency.  Patient was admitted to the hospitalist service and cardiology was consulted.  Eliquis stopped and started on heparin.  Echocardiogram done noted ejection fraction of 35% and grade 1 diastolic dysfunction.  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  Patient underwent interventional catheterization on 7/7 with cardiology doing an IV ultrasound-guided PCI of the SVG to the diagonal. Patient had worsening renal function on 7/8.  Started fluids. 7/9.  Renal function starting getting better, however, patient developed significant cough, COVID test come back positive.  Started steroids.  Assessment and Plan: Non-STEMI. Hypertension urgency. 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 Patient is status post cardiac cath, with stent placement to SVG to diagonal.  Continue dual antiplatelet treatment with aspirin Plavix, statin. Condition has stabilized. Echocardiogram showed ejection fraction 35 to 40%, with grade 1 diastolic dysfunction. Condition has improved.   Acute hypoxemic respiratory failure. COVID infection. Completed remdesivir, off oxygen.  Continue steroids.   Acute kidney injury on chronic kidney disease stage IIIb. Hypokalemia Metabolic acidosis. Renal function finally improving, continue sodium bicarb.    Uncontrolled type 2 diabetes with hyperglycemia. Glucose running  high, added insulin glargine.   Thrombocytopenia. Likely due to COVID, recheck a CBC tomorrow   Obesity with BMI 30.32.      Subjective:  Patient doing better, off oxygen, no short of breath.  No cough.  Physical Exam: Vitals:   12/17/21 1958 12/18/21 0157 12/18/21 0624 12/18/21 0843  BP: 128/89 (!) 146/97 135/88 129/87  Pulse: 65 66 65 60  Resp: '18 18 18 17  '$ Temp: 97.6 F (36.4 C) 97.9 F (36.6 C) 97.6 F (36.4 C) 97.7 F (36.5 C)  TempSrc: Oral Oral Oral Oral  SpO2: 97% 96% 96% 96%  Weight:      Height:       General exam: Appears calm and comfortable  Respiratory system: Clear to auscultation. Respiratory effort normal. Cardiovascular system: S1 & S2 heard, RRR. No JVD, murmurs, rubs, gallops or clicks. No pedal edema. Gastrointestinal system: Abdomen is nondistended, soft and nontender. No organomegaly or masses felt. Normal bowel sounds heard. Central nervous system: Alert and oriented x2. No focal neurological deficits. Extremities: Symmetric 5 x 5 power. Skin: No rashes, lesions or ulcers Psychiatry: Judgement and insight appear normal. Mood & affect appropriate.   Data Reviewed:  Lab results reviewed.  Family Communication: Niece updated on the phone.  Disposition: Status is: Inpatient Remains inpatient appropriate because: Unsafe discharge, pending SNF.   Planned Discharge Destination: Skilled nursing facility    Time spent: 35 minutes  Author: Sharen Hones, MD 12/18/2021 12:08 PM  For on call review [email or web address].

## 2021-12-19 DIAGNOSIS — I214 Non-ST elevation (NSTEMI) myocardial infarction: Secondary | ICD-10-CM | POA: Diagnosis not present

## 2021-12-19 DIAGNOSIS — I5043 Acute on chronic combined systolic (congestive) and diastolic (congestive) heart failure: Secondary | ICD-10-CM | POA: Diagnosis not present

## 2021-12-19 DIAGNOSIS — U071 COVID-19: Secondary | ICD-10-CM | POA: Diagnosis not present

## 2021-12-19 DIAGNOSIS — J9601 Acute respiratory failure with hypoxia: Secondary | ICD-10-CM | POA: Diagnosis not present

## 2021-12-19 DIAGNOSIS — D519 Vitamin B12 deficiency anemia, unspecified: Secondary | ICD-10-CM

## 2021-12-19 LAB — BASIC METABOLIC PANEL
Anion gap: 8 (ref 5–15)
BUN: 61 mg/dL — ABNORMAL HIGH (ref 8–23)
CO2: 21 mmol/L — ABNORMAL LOW (ref 22–32)
Calcium: 9.1 mg/dL (ref 8.9–10.3)
Chloride: 107 mmol/L (ref 98–111)
Creatinine, Ser: 2.98 mg/dL — ABNORMAL HIGH (ref 0.61–1.24)
GFR, Estimated: 21 mL/min — ABNORMAL LOW (ref >60)
Glucose, Bld: 305 mg/dL — ABNORMAL HIGH (ref 70–99)
Potassium: 4.3 mmol/L (ref 3.5–5.1)
Sodium: 136 mmol/L (ref 135–145)

## 2021-12-19 LAB — GLUCOSE, CAPILLARY
Glucose-Capillary: 304 mg/dL — ABNORMAL HIGH (ref 70–99)
Glucose-Capillary: 343 mg/dL — ABNORMAL HIGH (ref 70–99)
Glucose-Capillary: 291 mg/dL — ABNORMAL HIGH (ref 70–99)
Glucose-Capillary: 314 mg/dL — ABNORMAL HIGH (ref 70–99)

## 2021-12-19 LAB — CBC
HCT: 38.2 % — ABNORMAL LOW (ref 39.0–52.0)
Hemoglobin: 12.8 g/dL — ABNORMAL LOW (ref 13.0–17.0)
MCH: 28.7 pg (ref 26.0–34.0)
MCHC: 33.5 g/dL (ref 30.0–36.0)
MCV: 85.7 fL (ref 80.0–100.0)
Platelets: 137 10*3/uL — ABNORMAL LOW (ref 150–400)
RBC: 4.46 MIL/uL (ref 4.22–5.81)
RDW: 13.2 % (ref 11.5–15.5)
WBC: 6.9 10*3/uL (ref 4.0–10.5)
nRBC: 0 % (ref 0.0–0.2)

## 2021-12-19 MED ORDER — INSULIN ASPART 100 UNIT/ML IJ SOLN
5.0000 [IU] | Freq: Once | INTRAMUSCULAR | Status: AC
Start: 2021-12-19 — End: 2021-12-19
  Administered 2021-12-19: 5 [IU] via SUBCUTANEOUS
  Filled 2021-12-19: qty 1

## 2021-12-19 MED ORDER — INSULIN GLARGINE-YFGN 100 UNIT/ML ~~LOC~~ SOLN
15.0000 [IU] | Freq: Every day | SUBCUTANEOUS | Status: DC
  Filled 2021-12-19: qty 0.15

## 2021-12-19 MED ORDER — VITAMIN B-12 1000 MCG PO TABS
1000.0000 ug | ORAL_TABLET | Freq: Every day | ORAL | Status: DC
Start: 2021-12-19 — End: 2021-12-24
  Administered 2021-12-19 – 2021-12-24 (×6): 1000 ug via ORAL
  Filled 2021-12-19 (×6): qty 1

## 2021-12-19 MED ORDER — LOPERAMIDE HCL 2 MG PO CAPS
2.0000 mg | ORAL_CAPSULE | ORAL | Status: DC | PRN
  Administered 2021-12-19 – 2021-12-24 (×2): 2 mg via ORAL
  Filled 2021-12-19 (×2): qty 1

## 2021-12-19 MED ORDER — DEXAMETHASONE 6 MG PO TABS
6.0000 mg | ORAL_TABLET | Freq: Every day | ORAL | Status: DC
  Administered 2021-12-19 – 2021-12-20 (×2): 6 mg via ORAL
  Filled 2021-12-19 (×3): qty 1

## 2021-12-19 MED ORDER — INSULIN ASPART 100 UNIT/ML IJ SOLN
4.0000 [IU] | Freq: Three times a day (TID) | INTRAMUSCULAR | Status: DC
Start: 2021-12-19 — End: 2021-12-24
  Administered 2021-12-19 – 2021-12-24 (×15): 4 [IU] via SUBCUTANEOUS
  Filled 2021-12-19 (×13): qty 1

## 2021-12-19 NOTE — Progress Notes (Signed)
  Progress Note   Patient: Shane Holloway [id number]:[id number] DOB: 11-21-1950 DOA: 12/10/2021     9 DOS: the patient was seen and examined on 12/19/2021   Brief hospital course: 71 year old male with past medical history of BPH, CHF, CKD, vascular dementia, CAD status post CABG and aortic valve replacement with bioprosthetic valve on Eliquis who presented to the emergency room on 7/3 with complaints of chest pain and found to have a non-STEMI as well as hypertensive urgency.  Patient was admitted to the hospitalist service and cardiology was consulted.  Eliquis stopped and started on heparin.  Echocardiogram done noted ejection fraction of 35% and grade 1 diastolic dysfunction.  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  Patient underwent interventional catheterization on 7/7 with cardiology doing an IV ultrasound-guided PCI of the SVG to the diagonal. Patient had worsening renal function on 7/8.  Started fluids. 7/9.  Renal function starting getting better, however, patient developed significant cough, COVID test come back positive.  Started steroids.  Assessment and Plan: Non-STEMI. Hypertension urgency. 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 Patient is status post cardiac cath, with stent placement to SVG to diagonal.  Continue dual antiplatelet treatment with aspirin Plavix, statin. Condition has stabilized. Echocardiogram showed ejection fraction 35 to 40%, with grade 1 diastolic dysfunction. Patient doing well, volume status improved.  Continue current treatment.    Acute hypoxemic respiratory failure. COVID infection. Condition improved, off oxygen, change steroids to oral dexamethasone 6 mg daily.   Acute kidney injury on chronic kidney disease stage IIIb. Hypokalemia Metabolic acidosis. Renal function has been stabilized, continue sodium  bicarbonate.     Uncontrolled type 2 diabetes with hyperglycemia. Continue insulin glargine, sliding scale insulin, also added scheduled NovoLog.   Thrombocytopenia. Improving   Obesity with BMI 30.32.      Subjective:  Patient doing well today, no short of breath.  Good appetite.  Slept well.  Physical Exam: Vitals:   12/19/21 0015 12/19/21 0400 12/19/21 0410 12/19/21 0808  BP:  138/80  (!) 156/86  Pulse:  (!) 59  61  Resp:  14  17  Temp: 97.7 F (36.5 C)  97.6 F (36.4 C)   TempSrc:      SpO2:  96%  100%  Weight:      Height:       General exam: Appears calm and comfortable  Respiratory system: Clear to auscultation. Respiratory effort normal. Cardiovascular system: S1 & S2 heard, RRR. No JVD, murmurs, rubs, gallops or clicks. No pedal edema. Gastrointestinal system: Abdomen is nondistended, soft and nontender. No organomegaly or masses felt. Normal bowel sounds heard. Central nervous system: Alert and oriented x2. No focal neurological deficits. Extremities: Symmetric 5 x 5 power. Skin: No rashes, lesions or ulcers Psychiatry: Judgement and insight appear normal. Mood & affect appropriate.   Data Reviewed:  Lab results reviewed  Family Communication:   Disposition: Status is: Inpatient Remains inpatient appropriate because: Unsafe discharge  Planned Discharge Destination: Skilled nursing facility    Time spent: 35 minutes  Author: Sharen Hones, MD 12/19/2021 11:46 AM  For on call review [email or web address].

## 2021-12-19 NOTE — Inpatient Diabetes Management (Signed)
Inpatient Diabetes Program Recommendations  AACE/ADA: New Consensus Statement on Inpatient Glycemic Control   Target Ranges:  Prepandial:   less than 140 mg/dL      Peak postprandial:   less than 180 mg/dL (1-2 hours)      Critically ill patients:  140 - 180 mg/dL    Latest Reference Range & Units 12/18/21 08:43 12/18/21 12:34 12/18/21 17:35 12/18/21 19:41 12/18/21 23:39  Glucose-Capillary 70 - 99 mg/dL 258 (H) 291 (H) 325 (H) 363 (H) 325 (H)   Review of Glycemic Control  Current orders for Inpatient glycemic control: Semglee 8 units daily, Novolog 0-15 units TID with meals, Novolog 0-5 units QHS; Decadron 8 mg Q24H   Inpatient Diabetes Program Recommendations:    Insulin: If steroids are continued, please consider increasing Semglee to 15 units daily and adding Novolog 4 units TID with meals for meal coverage if patient eats at least 50% of meals.  Thanks, Barnie Alderman, RN, MSN, [place] Diabetes Coordinator Inpatient Diabetes Program [phone] (Team Pager from 8am to [place])

## 2021-12-19 NOTE — Progress Notes (Signed)
Physical Therapy Treatment Patient Details Name: Shane Holloway MRN: [id number] DOB: [birth year] Today's Date: 12/19/2021   History of Present Illness Patient is a 71 year old Caucasian male with a past medical history of coronary artery disease, BPH, congestive heart failure, CKD stage IV,  CVA, vascular dementia, CAD status post CABG and aortic valve replacement with bioprosthetic valve on Eliquis who presented to the emergency room on 7/3 with complaints of chest pain and found to have a non-STEMI as well as hypertensive urgency. Underwent heart catherization on 7/5 and 7/7 and PCI of SVG to diagonal.    PT Comments    Patient is agreeable to PT and reports feeling well today. He continues to be mildly impulsive and unsteady with ambulation without assistive device. Safety cues required with mobility. Balance is improved with using rolling walker for ambulation which is recommended for safety. The patient tells me he is planning to discharge to rehab in the morning which is reasonable as help at home is apparently limited. I would recommend to have family assist available if the patient decides to return home instead of SNF.    Recommendations for follow up therapy are one component of a multi-disciplinary discharge planning process, led by the attending physician.  Recommendations may be updated based on patient status, additional functional criteria and insurance authorization.  Follow Up Recommendations  Skilled nursing-short term rehab (<3 hours/day)     Assistance Recommended at Discharge Frequent or constant Supervision/Assistance  Patient can return home with the following A little help with walking and/or transfers;A little help with bathing/dressing/bathroom;Assistance with cooking/housework;Direct supervision/assist for financial management;Assist for transportation;Help with stairs or ramp for entrance;Direct supervision/assist for medications management   Equipment  Recommendations  Rolling walker (2 wheels)    Recommendations for Other Services       Precautions / Restrictions Precautions Precautions: Fall Restrictions Weight Bearing Restrictions: No     Mobility  Bed Mobility Overal bed mobility: Needs Assistance Bed Mobility: Supine to Sit     Supine to sit: Supervision     General bed mobility comments: no physical assistance required. cues for safety    Transfers Overall transfer level: Needs assistance   Transfers: Sit to/from Stand Sit to Stand: Min guard Stand pivot transfers: Min guard         General transfer comment: multiple standing bouts performed. Min guard assistance provided for safety as he is mildly impulsive with activity    Ambulation/Gait Ambulation/Gait assistance: Min guard   Assistive device: Rolling walker (2 wheels), None Gait Pattern/deviations: Step-through pattern, Staggering right Gait velocity: decreased     General Gait Details: patient is impuslive initially and is unsteady without use of assistive deivce. he staggers to the right and reaches out for furniture for support. recommended to use rolling walker for safety with ambulation for improvement in balance and fall prevention.   Stairs             Wheelchair Mobility    Modified Rankin (Stroke Patients Only)       Balance Overall balance assessment: Needs assistance Sitting-balance support: Feet supported Sitting balance-Leahy Scale: Good     Standing balance support: No upper extremity supported Standing balance-Leahy Scale: Poor (fair to poor) Standing balance comment: static standing is steady but patient required Min guard due to staggering with dynamic activity                            Cognition Arousal/Alertness: Awake/alert Behavior  During Therapy: Impulsive, WFL for tasks assessed/performed (mildly impulsive at times) Overall Cognitive Status: No family/caregiver present to determine baseline  cognitive functioning                                 General Comments: decreased awareness of need for assistance or his own physical limitiations        Exercises      General Comments        Pertinent Vitals/Pain Pain Assessment Pain Assessment: No/denies pain    Home Living                          Prior Function            PT Goals (current goals can now be found in the care plan section) Acute Rehab PT Goals Patient Stated Goal: to go home PT Goal Formulation: With patient Time For Goal Achievement: 12/30/21 Potential to Achieve Goals: Good Progress towards PT goals: Progressing toward goals    Frequency    Min 2X/week      PT Plan Current plan remains appropriate    Co-evaluation              AM-PAC PT "6 Clicks" Mobility   Outcome Measure  Help needed turning from your back to your side while in a flat bed without using bedrails?: None Help needed moving from lying on your back to sitting on the side of a flat bed without using bedrails?: None Help needed moving to and from a bed to a chair (including a wheelchair)?: None Help needed standing up from a chair using your arms (e.g., wheelchair or bedside chair)?: A Little Help needed to walk in hospital room?: A Little Help needed climbing 3-5 steps with a railing? : A Little 6 Click Score: 21    End of Session   Activity Tolerance: Patient tolerated treatment well Patient left: in chair;with call bell/phone within reach;with chair alarm set Nurse Communication: Mobility status PT Visit Diagnosis: Other abnormalities of gait and mobility (R26.89);Difficulty in walking, not elsewhere classified (R26.2);Muscle weakness (generalized) (M62.81)     Time: 7793-9030 PT Time Calculation (min) (ACUTE ONLY): 14 min  Charges:  $Therapeutic Activity: 8-22 mins                     Minna Merritts, PT, MPT    Percell Locus 12/19/2021, 3:57 PM

## 2021-12-19 NOTE — Progress Notes (Signed)
Occupational Therapy Treatment Patient Details Name: Shane Holloway MRN: [id number] DOB: 1950-09-16 Today's Date: 12/19/2021   History of present illness Patient is a 71 year old Caucasian male with a past medical history of coronary artery disease, BPH, congestive heart failure, CKD stage IV,  CVA, vascular dementia, CAD status post CABG and aortic valve replacement with bioprosthetic valve on Eliquis who presented to the emergency room on 7/3 with complaints of chest pain and found to have a non-STEMI as well as hypertensive urgency. Underwent heart catherization on 7/5 and 7/7 and PCI of SVG to diagonal.   OT comments  Chart reviewed, RN cleared pt for participation in OT tx session. Pt is alert and oriented x4, however presents with fair-poor safety awareness and is impulsive throughout tx session. Tx session targeted improving activity tolerance and endurance for improved ADL task completion. Improvements noted in mobility and ADL task completion (please see below) however pt would continue to benefit from STR to address deficits. Pt is left as received, NAD, all needs met. OT will follow acutely.    Recommendations for follow up therapy are one component of a multi-disciplinary discharge planning process, led by the attending physician.  Recommendations may be updated based on patient status, additional functional criteria and insurance authorization.    Follow Up Recommendations  Skilled nursing-short term rehab (<3 hours/day)    Assistance Recommended at Discharge Frequent or constant Supervision/Assistance  Patient can return home with the following  A little help with walking and/or transfers;A little help with bathing/dressing/bathroom;Help with stairs or ramp for entrance;Assist for transportation;Assistance with cooking/housework;Direct supervision/assist for medications management;Direct supervision/assist for financial management   Equipment Recommendations        Recommendations for Other Services      Precautions / Restrictions Precautions Precautions: Fall Restrictions Weight Bearing Restrictions: No       Mobility Bed Mobility Overal bed mobility: Needs Assistance Bed Mobility: Supine to Sit, Sit to Supine     Supine to sit: Supervision Sit to supine: Supervision        Transfers Overall transfer level: Needs assistance Equipment used: Rolling walker (2 wheels) Transfers: Sit to/from Stand Sit to Stand: Min guard                 Balance Overall balance assessment: Needs assistance Sitting-balance support: Feet supported Sitting balance-Leahy Scale: Good     Standing balance support: No upper extremity supported Standing balance-Leahy Scale: Poor Standing balance comment: poor dynamic standing at sink to reach for ADL items                           ADL either performed or assessed with clinical judgement   ADL Overall ADL's : Needs assistance/impaired     Grooming: Wash/dry hands;Wash/dry face;Oral care;Standing;Supervision/safety;Cueing for safety   Upper Body Bathing: Sitting;Set up;Cueing for sequencing       Upper Body Dressing : Set up       Toilet Transfer: Min guard;Rolling walker (2 wheels);Cueing for safety;Cueing for sequencing Toilet Transfer Details (indicate cue type and reason): frequent vcs for saftey         Functional mobility during ADLs: Min guard;Cueing for safety;Rolling walker (2 wheels)      Extremity/Trunk Assessment              Vision       Perception     Praxis      Cognition Arousal/Alertness: Awake/alert Behavior During Therapy: Impulsive, WFL for tasks  assessed/performed Overall Cognitive Status: No family/caregiver present to determine baseline cognitive functioning Area of Impairment: Attention, Following commands, Safety/judgement, Awareness, Problem solving                   Current Attention Level: Sustained   Following  Commands: Follows multi-step commands inconsistently, Follows one step commands consistently Safety/Judgement: Decreased awareness of safety, Decreased awareness of deficits Awareness: Emergent Problem Solving: Difficulty sequencing, Requires verbal cues, Requires tactile cues          Exercises      Shoulder Instructions       General Comments vss throughout    Pertinent Vitals/ Pain       Pain Assessment Pain Assessment: No/denies pain  Home Living                                          Prior Functioning/Environment              Frequency  Min 2X/week        Progress Toward Goals  OT Goals(current goals can now be found in the care plan section)  Progress towards OT goals: Progressing toward goals     Plan Discharge plan remains appropriate    Co-evaluation                 AM-PAC OT "6 Clicks" Daily Activity     Outcome Measure   Help from another person eating meals?: None Help from another person taking care of personal grooming?: None Help from another person toileting, which includes using toliet, bedpan, or urinal?: A Little   Help from another person to put on and taking off regular upper body clothing?: A Little Help from another person to put on and taking off regular lower body clothing?: A Little 6 Click Score: 17    End of Session Equipment Utilized During Treatment: Rolling walker (2 wheels)  OT Visit Diagnosis: Unsteadiness on feet (R26.81);Repeated falls (R29.6);Muscle weakness (generalized) (M62.81)   Activity Tolerance Patient tolerated treatment well   Patient Left in bed;with call bell/phone within reach;with bed alarm set   Nurse Communication Mobility status        Time: 1725-1741 OT Time Calculation (min): 16 min  Charges: OT General Charges $OT Visit: 1 Visit OT Treatments $Self Care/Home Management : 8-22 mins  Shanon Payor, OTD OTR/L  12/19/21, 5:57 PM

## 2021-12-19 NOTE — TOC Progression Note (Signed)
Transition of Care [facility]) - Progression Note    Patient Details  Name: Shane Holloway MRN: [id number] Date of Birth: 25-Apr-1951  Transition of Care [facility]) CM/SW Contact  Laurena Slimmer, RN Phone Number: 12/19/2021, 3:26 PM  Clinical Narrative:    Spoke with patient by cell phone at [phone]. Patient confirmed he does not want to go to a SNF. He will return home at discharge.    Expected Discharge Plan: [place] Barriers to Discharge: Continued Medical Work up  Expected Discharge Plan and Services Expected Discharge Plan: [place] arrangements for the past 2 months: Single Family Home                                       Social Determinants of Health (SDOH) Interventions    Readmission Risk Interventions     No data to display

## 2021-12-20 DIAGNOSIS — J9601 Acute respiratory failure with hypoxia: Secondary | ICD-10-CM | POA: Diagnosis not present

## 2021-12-20 DIAGNOSIS — U071 COVID-19: Secondary | ICD-10-CM | POA: Diagnosis not present

## 2021-12-20 DIAGNOSIS — I5043 Acute on chronic combined systolic (congestive) and diastolic (congestive) heart failure: Secondary | ICD-10-CM | POA: Diagnosis not present

## 2021-12-20 DIAGNOSIS — I214 Non-ST elevation (NSTEMI) myocardial infarction: Secondary | ICD-10-CM | POA: Diagnosis not present

## 2021-12-20 LAB — GLUCOSE, CAPILLARY
Glucose-Capillary: 297 mg/dL — ABNORMAL HIGH (ref 70–99)
Glucose-Capillary: 360 mg/dL — ABNORMAL HIGH (ref 70–99)
Glucose-Capillary: 406 mg/dL — ABNORMAL HIGH (ref 70–99)
Glucose-Capillary: 402 mg/dL — ABNORMAL HIGH (ref 70–99)

## 2021-12-20 MED ORDER — INSULIN GLARGINE-YFGN 100 UNIT/ML ~~LOC~~ SOLN
18.0000 [IU] | Freq: Every day | SUBCUTANEOUS | Status: DC
Start: 2021-12-20 — End: 2021-12-23
  Administered 2021-12-20 – 2021-12-23 (×4): 18 [IU] via SUBCUTANEOUS
  Filled 2021-12-20 (×4): qty 0.18

## 2021-12-20 MED ORDER — INSULIN ASPART 100 UNIT/ML IJ SOLN
0.0000 [IU] | Freq: Three times a day (TID) | INTRAMUSCULAR | Status: DC
  Administered 2021-12-21: 7 [IU] via SUBCUTANEOUS
  Administered 2021-12-21: 15 [IU] via SUBCUTANEOUS
  Administered 2021-12-21 (×2): 11 [IU] via SUBCUTANEOUS
  Administered 2021-12-22 (×2): 4 [IU] via SUBCUTANEOUS
  Administered 2021-12-22: 11 [IU] via SUBCUTANEOUS
  Administered 2021-12-22: 7 [IU] via SUBCUTANEOUS
  Administered 2021-12-23: 4 [IU] via SUBCUTANEOUS
  Administered 2021-12-23 (×2): 7 [IU] via SUBCUTANEOUS
  Administered 2021-12-23 – 2021-12-24 (×2): 4 [IU] via SUBCUTANEOUS
  Filled 2021-12-20 (×13): qty 1

## 2021-12-20 MED ORDER — GUAIFENESIN ER 600 MG PO TB12
600.0000 mg | ORAL_TABLET | Freq: Two times a day (BID) | ORAL | Status: DC
  Administered 2021-12-20 – 2021-12-24 (×9): 600 mg via ORAL
  Filled 2021-12-20 (×9): qty 1

## 2021-12-20 NOTE — TOC Progression Note (Signed)
Transition of Care [facility]) - Progression Note    Patient Details  Name: Shane Holloway MRN: [id number] Date of Birth: 1951/06/09  Transition of Care [facility]) CM/SW Contact  Laurena Slimmer, RN Phone Number: 12/20/2021, 10:38 AM  Clinical Narrative:    Spoke with patient's Niece to for choice of SNF. She did not have a preference. She stated  her father lives next door to patient and takes care of the patient in his own home. The patient's brother has positive for COIVD and is unable to care for him at this time. Family is agreeable to SNF only if patient is agreeable. [name] stated she was en route to the hospital as we spoke to talk to patient. Will begin SNF workup.   Expected Discharge Plan: [place] Barriers to Discharge: Continued Medical Work up  Expected Discharge Plan and Services Expected Discharge Plan: [place] arrangements for the past 2 months: Single Family Home                                       Social Determinants of Health (SDOH) Interventions    Readmission Risk Interventions     No data to display

## 2021-12-20 NOTE — Progress Notes (Signed)
  Progress Note   Patient: Shane Holloway XTK:[id number] DOB: 1951/04/21 DOA: 12/10/2021     10 DOS: the patient was seen and examined on 12/20/2021   Brief hospital course: 71 year old male with past medical history of BPH, CHF, CKD, vascular dementia, CAD status post CABG and aortic valve replacement with bioprosthetic valve on Eliquis who presented to the emergency room on 7/3 with complaints of chest pain and found to have a non-STEMI as well as hypertensive urgency.  Patient was admitted to the hospitalist service and cardiology was consulted.  Eliquis stopped and started on heparin.  Echocardiogram done noted ejection fraction of 35% and grade 1 diastolic dysfunction.  Cardiology planning to take patient for left heart catheterization on 7/5 which noted severe multivessel disease.  Patient underwent interventional catheterization on 7/7 with cardiology doing an IV ultrasound-guided PCI of the SVG to the diagonal. Patient had worsening renal function on 7/8.  Started fluids. 7/9.  Renal function starting getting better, however, patient developed significant cough, COVID test come back positive.  Started steroids. Patient condition so far had improved, off oxygen, currently pending nursing home placement  Assessment and Plan:  Non-STEMI. Hypertension urgency. 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 Patient is status post cardiac cath, with stent placement to SVG to diagonal.  Continue dual antiplatelet treatment with aspirin Plavix, statin. Condition has stabilized. Echocardiogram showed ejection fraction 35 to 40%, with grade 1 diastolic dysfunction. No new issues, currently pending nursing home placement    Acute hypoxemic respiratory failure. COVID infection. Continue steroids, patient off oxygen.   Acute kidney injury on chronic kidney disease stage IIIb. Hypokalemia Metabolic acidosis. Continue sodium bicarbonate.  Recheck of renal function  tomorrow.     Uncontrolled type 2 diabetes with hyperglycemia. Glucose running high again, increase long-acting insulin dose.   Thrombocytopenia. CBC tomorrow   Obesity with BMI 30.32.     Subjective:  Doing well, currently has no complaints  Physical Exam: Vitals:   12/20/21 0340 12/20/21 0345 12/20/21 0700 12/20/21 1132  BP:  121/76 (!) 147/98 (!) 142/90  Pulse: 61  62   Resp: '18  15 16  '$ Temp: 97.7 F (36.5 C)  98 F (36.7 C)   TempSrc: Oral  Oral   SpO2: 94%  97%   Weight:      Height:       General exam: Appears calm and comfortable  Respiratory system: Clear to auscultation. Respiratory effort normal. Cardiovascular system: S1 & S2 heard, RRR. No JVD, murmurs, rubs, gallops or clicks. No pedal edema. Gastrointestinal system: Abdomen is nondistended, soft and nontender. No organomegaly or masses felt. Normal bowel sounds heard. Central nervous system: Alert and oriented x2. No focal neurological deficits. Extremities: Symmetric 5 x 5 power. Skin: No rashes, lesions or ulcers Psychiatry: Judgement and insight appear normal. Mood & affect appropriate.   Data Reviewed:  Lab results reviewed  Family Communication:   Disposition: Status is: Inpatient Remains inpatient appropriate because: Unsafe discharge, pending nursing home placement  Planned Discharge Destination: Skilled nursing facility    Time spent: 28 minutes  Author: Sharen Hones, MD 12/20/2021 1:28 PM  For on call review [email or web address].

## 2021-12-20 NOTE — TOC Progression Note (Signed)
Transition of Care [facility]) - Progression Note    Patient Details  Name: Shane Holloway MRN: [id number] Date of Birth: 01/22/1951  Transition of Care [facility]) CM/SW Contact  Laurena Slimmer, RN Phone Number: 12/20/2021, 4:37 PM  Clinical Narrative:    Applied for and retrieved [place] PASSR.    Expected Discharge Plan: [place] Barriers to Discharge: Continued Medical Work up  Expected Discharge Plan and Services Expected Discharge Plan: [place] arrangements for the past 2 months: Single Family Home                                       Social Determinants of Health (SDOH) Interventions    Readmission Risk Interventions     No data to display

## 2021-12-21 DIAGNOSIS — I5043 Acute on chronic combined systolic (congestive) and diastolic (congestive) heart failure: Secondary | ICD-10-CM | POA: Diagnosis not present

## 2021-12-21 DIAGNOSIS — I214 Non-ST elevation (NSTEMI) myocardial infarction: Secondary | ICD-10-CM | POA: Diagnosis not present

## 2021-12-21 DIAGNOSIS — N170 Acute kidney failure with tubular necrosis: Secondary | ICD-10-CM | POA: Diagnosis not present

## 2021-12-21 DIAGNOSIS — U071 COVID-19: Secondary | ICD-10-CM | POA: Diagnosis not present

## 2021-12-21 LAB — GLUCOSE, CAPILLARY
Glucose-Capillary: 238 mg/dL — ABNORMAL HIGH (ref 70–99)
Glucose-Capillary: 264 mg/dL — ABNORMAL HIGH (ref 70–99)
Glucose-Capillary: 300 mg/dL — ABNORMAL HIGH (ref 70–99)
Glucose-Capillary: 300 mg/dL — ABNORMAL HIGH (ref 70–99)
Glucose-Capillary: 329 mg/dL — ABNORMAL HIGH (ref 70–99)

## 2021-12-21 LAB — BASIC METABOLIC PANEL
Anion gap: 6 (ref 5–15)
BUN: 60 mg/dL — ABNORMAL HIGH (ref 8–23)
CO2: 22 mmol/L (ref 22–32)
Calcium: 9.1 mg/dL (ref 8.9–10.3)
Chloride: 107 mmol/L (ref 98–111)
Creatinine, Ser: 2.66 mg/dL — ABNORMAL HIGH (ref 0.61–1.24)
GFR, Estimated: 25 mL/min — ABNORMAL LOW (ref >60)
Glucose, Bld: 274 mg/dL — ABNORMAL HIGH (ref 70–99)
Potassium: 4.1 mmol/L (ref 3.5–5.1)
Sodium: 135 mmol/L (ref 135–145)

## 2021-12-21 LAB — CBC
HCT: 37 % — ABNORMAL LOW (ref 39.0–52.0)
Hemoglobin: 12.6 g/dL — ABNORMAL LOW (ref 13.0–17.0)
MCH: 28.6 pg (ref 26.0–34.0)
MCHC: 34.1 g/dL (ref 30.0–36.0)
MCV: 83.9 fL (ref 80.0–100.0)
Platelets: 149 10*3/uL — ABNORMAL LOW (ref 150–400)
RBC: 4.41 MIL/uL (ref 4.22–5.81)
RDW: 13 % (ref 11.5–15.5)
WBC: 8.3 10*3/uL (ref 4.0–10.5)
nRBC: 0 % (ref 0.0–0.2)

## 2021-12-21 LAB — MAGNESIUM: Magnesium: 2.1 mg/dL (ref 1.7–2.4)

## 2021-12-21 NOTE — Progress Notes (Signed)
Physical Therapy Treatment Patient Details Name: Shane Holloway MRN: [id number] DOB: 11-11-1950 Today's Date: 12/21/2021   History of Present Illness Patient is a 71 year old Caucasian male with a past medical history of coronary artery disease, BPH, congestive heart failure, CKD stage IV,  CVA, vascular dementia, CAD status post CABG and aortic valve replacement with bioprosthetic valve on Eliquis who presented to the emergency room on 7/3 with complaints of chest pain and found to have a non-STEMI as well as hypertensive urgency. Underwent heart catherization on 7/5 and 7/7 and PCI of SVG to diagonal.   PT Comments    Patient is agreeable to PT. He remains mildly impulsive with activity and needs safety cues. Ambulation performed with and without rolling walker. The patient is unsteady without device with staggering to the right. Recommend to use rolling walker for safety for improved balance with walking. The patient is agreeable to go to rehab which is recommended unless patient has supervision from family at home due to safety concerns with mobility.    Recommendations for follow up therapy are one component of a multi-disciplinary discharge planning process, led by the attending physician.  Recommendations may be updated based on patient status, additional functional criteria and insurance authorization.  Follow Up Recommendations  Skilled nursing-short term rehab (<3 hours/day) Can patient physically be transported by private vehicle: Yes   Assistance Recommended at Discharge Frequent or constant Supervision/Assistance  Patient can return home with the following A little help with walking and/or transfers;A little help with bathing/dressing/bathroom;Assistance with cooking/housework;Direct supervision/assist for financial management;Assist for transportation;Help with stairs or ramp for entrance;Direct supervision/assist for medications management   Equipment Recommendations  Rolling  walker (2 wheels)    Recommendations for Other Services       Precautions / Restrictions Precautions Precautions: Fall Restrictions Weight Bearing Restrictions: No     Mobility  Bed Mobility Overal bed mobility: Needs Assistance Bed Mobility: Supine to Sit, Sit to Supine     Supine to sit: Supervision Sit to supine: Supervision   General bed mobility comments: increased time and effort    Transfers Overall transfer level: Needs assistance Equipment used: None Transfers: Sit to/from Stand Sit to Stand: Min guard           General transfer comment: cues for safety and technique. multiple standing bouts performed from bed    Ambulation/Gait Ambulation/Gait assistance: Min guard Gait Distance (Feet): 100 Feet Assistive device: Rolling walker (2 wheels), None Gait Pattern/deviations: Staggering right Gait velocity: decrease     General Gait Details: patient is unsteady and staggering to the right without use of rolling walker, requiring Min guard and safety cues. with rolling walker, balance improved. recommend to continue using rolling walker for safety   Stairs             Wheelchair Mobility    Modified Rankin (Stroke Patients Only)       Balance Overall balance assessment: Needs assistance Sitting-balance support: Feet supported Sitting balance-Leahy Scale: Good     Standing balance support: No upper extremity supported Standing balance-Leahy Scale: Poor Standing balance comment: poor dynamic standing balance with staggering to right without assistive device                            Cognition Arousal/Alertness: Awake/alert Behavior During Therapy: Impulsive, WFL for tasks assessed/performed Overall Cognitive Status: No family/caregiver present to determine baseline cognitive functioning  General Comments: decreased awareness of need for assistance or his own physical limitiations         Exercises      General Comments        Pertinent Vitals/Pain Pain Assessment Pain Assessment: No/denies pain    Home Living                          Prior Function            PT Goals (current goals can now be found in the care plan section) Acute Rehab PT Goals Patient Stated Goal: to go home PT Goal Formulation: With patient Time For Goal Achievement: 12/30/21 Potential to Achieve Goals: Good Progress towards PT goals: Progressing toward goals    Frequency    Min 2X/week      PT Plan Current plan remains appropriate    Co-evaluation              AM-PAC PT "6 Clicks" Mobility   Outcome Measure  Help needed turning from your back to your side while in a flat bed without using bedrails?: None Help needed moving from lying on your back to sitting on the side of a flat bed without using bedrails?: None Help needed moving to and from a bed to a chair (including a wheelchair)?: None Help needed standing up from a chair using your arms (e.g., wheelchair or bedside chair)?: A Little Help needed to walk in hospital room?: A Little Help needed climbing 3-5 steps with a railing? : A Little 6 Click Score: 21    End of Session   Activity Tolerance: Patient tolerated treatment well Patient left: in bed;with call bell/phone within reach;with bed alarm set Nurse Communication: Mobility status PT Visit Diagnosis: Other abnormalities of gait and mobility (R26.89);Difficulty in walking, not elsewhere classified (R26.2);Muscle weakness (generalized) (M62.81)     Time: 6269-4854 PT Time Calculation (min) (ACUTE ONLY): 20 min  Charges:  $Gait Training: 8-22 mins                     Minna Merritts, PT, MPT    Percell Locus 12/21/2021, 3:05 PM

## 2021-12-21 NOTE — TOC Progression Note (Signed)
Transition of Care [facility]) - Progression Note    Patient Details  Name: Shane Holloway MRN: [id number] Date of Birth: [birth year]  Transition of Care [facility]) CM/SW Contact  Laurena Slimmer, RN Phone Number: 12/21/2021, 3:47 PM  Clinical Narrative:    Spoke with patient, his niece and brother to solifidy discharge plan. Patient given bed offer for [facility]. Family and patient agreeable to SNF. Auth needed.   Expected Discharge Plan: [place] Barriers to Discharge: Continued Medical Work up  Expected Discharge Plan and Services Expected Discharge Plan: [place] arrangements for the past 2 months: Single Family Home                                       Social Determinants of Health (SDOH) Interventions    Readmission Risk Interventions     No data to display

## 2021-12-21 NOTE — Plan of Care (Signed)
  Problem: Education: Goal: Understanding of cardiac disease, CV risk reduction, and recovery process will improve Outcome: Progressing Goal: Individualized Educational Video(s) Outcome: Progressing   Problem: Activity: Goal: Ability to tolerate increased activity will improve Outcome: Progressing   Problem: Cardiac: Goal: Ability to achieve and maintain adequate cardiovascular perfusion will improve Outcome: Progressing   

## 2021-12-21 NOTE — Plan of Care (Signed)
  Problem: Activity: Goal: Ability to tolerate increased activity will improve Outcome: Progressing   Problem: Cardiac: Goal: Ability to achieve and maintain adequate cardiovascular perfusion will improve Outcome: Progressing   Problem: Clinical Measurements: Goal: Ability to maintain clinical measurements within normal limits will improve Outcome: Progressing Up to bathroom this shift tolerated without distress.  VSS.

## 2021-12-21 NOTE — TOC Progression Note (Signed)
Transition of Care [facility]) - Progression Note    Patient Details  Name: Shane Holloway MRN: [id number] Date of Birth: May 18, 1951  [facility]) CM/SW Contact  Laurena Slimmer, RN Phone Number: 12/21/2021, 10:26 AM  Clinical Narrative:    Bed search initiated.    Expected Discharge Plan: [place] Barriers to Discharge: Continued Medical Work up  Expected Discharge Plan and Services Expected Discharge Plan: [place] arrangements for the past 2 months: Single Family Home                                       Social Determinants of Health (SDOH) Interventions    Readmission Risk Interventions     No data to display

## 2021-12-21 NOTE — NC FL2 (Signed)
[place] LEVEL OF CARE SCREENING TOOL     IDENTIFICATION  Patient Name: Shane Holloway Birthdate: March 14, 1951 Sex: male Admission Date (Current Location): 12/10/2021  [facility] and [place] Number:  Engineering geologist and Address:  [facility], [address] [place], [place] [address]      Provider Number: [id number]  Attending Physician Name and Address:  Sharen Hones, MD  Relative Name and Phone Number:  [name] [phone]    Current Level of Care: Hospital Recommended Level of Care: [place] Prior Approval Number:    Date Approved/Denied:   PASRR Number: [id number]  Discharge Plan: SNF    Current Diagnoses: Patient Active Problem List   Diagnosis Date Noted   B12 deficiency anemia 12/19/2021   Thrombocytopenia ([place]) 12/16/2021   COVID-19 virus infection 12/16/2021   Acute respiratory failure with hypoxia ([place]) 12/16/2021   Acute renal failure with acute tubular necrosis superimposed on stage 3b chronic kidney disease ([place]) 12/15/2021   Obesity (BMI 30-39.9) 12/11/2021   NSTEMI (non-ST elevated myocardial infarction) ([place]) 12/10/2021   Hypertensive urgency 12/10/2021   Chronic kidney disease, stage 3b ([place]) 12/10/2021   SVT (supraventricular tachycardia) ([place]) 03/20/2021   S/P aortic valve replacement with bioprosthetic valve 02/22/2021   Vascular dementia without behavioral disturbance ([place]) 09/13/2020   Stage 3 chronic kidney disease ([place]) 04/20/2017   History of stroke 02/22/2014   Benign prostatic hyperplasia 04/16/2012   Uncontrolled type 2 diabetes mellitus with hyperglycemia, without long-term current use of insulin ([place]) 06/27/2009   CORONARY ATHEROSCLEROSIS, ARTERY BYPASS GRAFT 06/27/2009   Essential hypertension 11/24/2008   Acute on chronic combined systolic and diastolic CHF (congestive heart failure) ([place]) 11/24/2008   DYSPNEA 11/24/2008    Orientation RESPIRATION BLADDER Height &  Weight     Self, Time, Situation, Place  Normal External catheter Weight: 100.7 kg Height:  6' 1.5" (186.7 cm)  BEHAVIORAL SYMPTOMS/MOOD NEUROLOGICAL BOWEL NUTRITION STATUS  Other (Comment)  (n/a) Incontinent Diet (Heart Healthy/ Carb Modified)  AMBULATORY STATUS COMMUNICATION OF NEEDS Skin   Limited Assist Verbally Normal                       Personal Care Assistance Level of Assistance  Bathing, Dressing, Total care Bathing Assistance: Limited assistance   Dressing Assistance: Limited assistance Total Care Assistance: Limited assistance   Functional Limitations Info             SPECIAL CARE FACTORS FREQUENCY  PT (By licensed PT), OT (By licensed OT)     PT Frequency: Min 2xweekly OT Frequency: Min 2xweekly            Contractures Contractures Info: Not present    Additional Factors Info  Code Status, Allergies Code Status Info: FULL Allergies Info: NSTEMI (non-ST elevated myocardial infarction)           Current Medications (12/21/2021):  This is the current hospital active medication list Current Facility-Administered Medications  Medication Dose Route Frequency Provider Last Rate Last Admin   0.9 %  sodium chloride infusion  250 mL Intravenous PRN Kathlyn Sacramento A, MD       0.9 %  sodium chloride infusion  250 mL Intravenous PRN Wellington Hampshire, MD   Stopped at 12/18/21 1144   acetaminophen (TYLENOL) tablet 650 mg  650 mg Oral Q4H PRN Wellington Hampshire, MD       alum & mag hydroxide-simeth (MAALOX/MYLANTA) 200-200-20 MG/5ML suspension 30 mL  30 mL Oral  Q6H PRN Wellington Hampshire, MD   30 mL at 12/13/21 0503   amiodarone (PACERONE) tablet 200 mg  200 mg Oral Daily Kathlyn Sacramento A, MD   200 mg at 12/21/21 0831   apixaban (ELIQUIS) tablet 5 mg  5 mg Oral BID Kathlyn Sacramento A, MD   5 mg at 12/21/21 0830   atorvastatin (LIPITOR) tablet 80 mg  80 mg Oral Daily Kathlyn Sacramento A, MD   80 mg at 12/21/21 0830   benzonatate (TESSALON) capsule 100 mg  100 mg  Oral TID PRN Wellington Hampshire, MD   100 mg at 12/20/21 1202   carvedilol (COREG) tablet 12.5 mg  12.5 mg Oral BID WC Kathlyn Sacramento A, MD   12.5 mg at 12/20/21 1750   cholecalciferol (VITAMIN D3) tablet 5,000 Units  5,000 Units Oral Daily Wellington Hampshire, MD   5,000 Units at 12/21/21 0830   clopidogrel (PLAVIX) tablet 75 mg  75 mg Oral Q breakfast Kathlyn Sacramento A, MD   75 mg at 12/21/21 0831   cyclobenzaprine (FLEXERIL) tablet 5 mg  5 mg Oral BID Kathlyn Sacramento A, MD   5 mg at 12/21/21 0830   fenofibrate tablet 54 mg  54 mg Oral Daily Kathlyn Sacramento A, MD   54 mg at 12/21/21 0834   finasteride (PROSCAR) tablet 5 mg  5 mg Oral Daily Kathlyn Sacramento A, MD   5 mg at 12/21/21 0830   guaiFENesin (MUCINEX) 12 hr tablet 600 mg  600 mg Oral BID Sharen Hones, MD   600 mg at 12/21/21 0831   hydrALAZINE (APRESOLINE) injection 10 mg  10 mg Intravenous Q6H PRN Kathlyn Sacramento A, MD   10 mg at 12/11/21 0123   insulin aspart (novoLOG) injection 0-20 Units  0-20 Units Subcutaneous TID [facility] Mansy, Arvella Merles, MD   11 Units at 12/21/21 0832   insulin aspart (novoLOG) injection 4 Units  4 Units Subcutaneous TID WC Sharen Hones, MD   4 Units at 12/21/21 0833   insulin glargine-yfgn (SEMGLEE) injection 18 Units  18 Units Subcutaneous Daily Sharen Hones, MD   18 Units at 12/21/21 1610   isosorbide mononitrate (IMDUR) 24 hr tablet 30 mg  30 mg Oral BID Kathlyn Sacramento A, MD   30 mg at 12/21/21 0831   loperamide (IMODIUM) capsule 2 mg  2 mg Oral PRN Sharen Hones, MD   2 mg at 12/19/21 9604   LORazepam (ATIVAN) injection 1 mg  1 mg Intravenous Q4H PRN Wellington Hampshire, MD       menthol-cetylpyridinium (CEPACOL) lozenge 3 mg  1 lozenge Oral PRN Wellington Hampshire, MD   3 mg at 12/20/21 0941   morphine (PF) 2 MG/ML injection 2 mg  2 mg Intravenous Q2H PRN Wellington Hampshire, MD   2 mg at 12/11/21 0303   nitroGLYCERIN (NITROSTAT) SL tablet 0.4 mg  0.4 mg Sublingual Q5 Min x 3 PRN Wellington Hampshire, MD       omega-3 acid  ethyl esters (LOVAZA) capsule 2 g  2 g Oral BID Kathlyn Sacramento A, MD   2 g at 12/21/21 0831   ondansetron (ZOFRAN) injection 4 mg  4 mg Intravenous Q6H PRN Wellington Hampshire, MD       sodium bicarbonate tablet 650 mg  650 mg Oral BID Sharen Hones, MD   650 mg at 12/21/21 0831   sodium chloride flush (NS) 0.9 % injection 3 mL  3 mL Intravenous Q12H Kathlyn Sacramento  A, MD   3 mL at 12/21/21 0835   sodium chloride flush (NS) 0.9 % injection 3 mL  3 mL Intravenous Q12H Kathlyn Sacramento A, MD   3 mL at 12/21/21 0835   sodium chloride flush (NS) 0.9 % injection 3 mL  3 mL Intravenous PRN Wellington Hampshire, MD       tamsulosin (FLOMAX) capsule 0.4 mg  0.4 mg Oral Daily Kathlyn Sacramento A, MD   0.4 mg at 12/21/21 0830   vitamin B-12 (CYANOCOBALAMIN) tablet 1,000 mcg  1,000 mcg Oral Daily Sharen Hones, MD   1,000 mcg at 12/21/21 0831     Discharge Medications: Please see discharge summary for a list of discharge medications.  Relevant Imaging Results:  Relevant Lab Results:   Additional Information Patient's SS# [id number]  Laurena Slimmer, RN

## 2021-12-21 NOTE — Progress Notes (Signed)
  Progress Note   Patient: Shane Holloway:[id number] DOB: 1950/07/29 DOA: 12/10/2021     11 DOS: the patient was seen and examined on 12/21/2021   Brief hospital course: 71 year old male with past medical history of BPH, CHF, CKD, vascular dementia, CAD status post CABG and aortic valve replacement with bioprosthetic valve on Eliquis who presented to the emergency room on 7/3 with complaints of chest pain and found to have a non-STEMI as well as hypertensive urgency.  Patient was admitted to the hospitalist service and cardiology was consulted.  Eliquis stopped and started on heparin.  Echocardiogram done noted ejection fraction of 35% and grade 1 diastolic dysfunction.  Cardiology planning to take patient for left heart catheterization on 7/5 which noted severe multivessel disease.  Patient underwent interventional catheterization on 7/7 with cardiology doing an IV ultrasound-guided PCI of the SVG to the diagonal. Patient had worsening renal function on 7/8.  Started fluids. 7/9.  Renal function starting getting better, however, patient developed significant cough, COVID test come back positive.  Started steroids. Patient condition so far had improved, off oxygen, currently pending nursing home placement  Assessment and Plan: Non-STEMI. Hypertension urgency. 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 Patient is status post cardiac cath, with stent placement to SVG to diagonal.  Continue dual antiplatelet treatment with aspirin Plavix, statin. Condition has stabilized. Echocardiogram showed ejection fraction 35 to 40%, with grade 1 diastolic dysfunction. Continue current treatment.    Acute hypoxemic respiratory failure. COVID infection. Condition improved, will discontinue steroids.  Patient has significantly increased glucose due to it, benefit does not overweigh risk.   Acute kidney injury on chronic kidney disease stage IIIb. Hypokalemia Metabolic  acidosis. Renal function much improved.  Continue bicarb for another day.     Uncontrolled type 2 diabetes with hyperglycemia.  I will discontinue steroids.  Glucose still running high, anticipating better controlled after discontinuation of steroids.   Thrombocytopenia. Improved.   Obesity with BMI 30.32.      Subjective:  Patient doing well, no new issues.  Physical Exam: Vitals:   12/21/21 0418 12/21/21 0508 12/21/21 0800 12/21/21 1145  BP: (!) 163/97  (!) 151/95 139/86  Pulse: 60  62 68  Resp: '17  13 18  '$ Temp: 97.7 F (36.5 C)  97.9 F (36.6 C) (!) 97.5 F (36.4 C)  TempSrc: Oral  Oral Oral  SpO2: 98%     Weight:  100.7 kg    Height:       General exam: Appears calm and comfortable  Respiratory system: Clear to auscultation. Respiratory effort normal. Cardiovascular system: S1 & S2 heard, RRR. No JVD, murmurs, rubs, gallops or clicks. No pedal edema. Gastrointestinal system: Abdomen is nondistended, soft and nontender. No organomegaly or masses felt. Normal bowel sounds heard. Central nervous system: Alert and oriented x2. No focal neurological deficits. Extremities: Symmetric 5 x 5 power. Skin: No rashes, lesions or ulcers Psychiatry: Judgement and insight appear normal. Mood & affect appropriate.   Data Reviewed:  Lab results reviewed  Family Communication:   Disposition: Status is: Inpatient Remains inpatient appropriate because: Unsafe discharge, pending nursing home placement  Planned Discharge Destination: Skilled nursing facility    Time spent: 35 minutes  Author: Sharen Hones, MD 12/21/2021 3:30 PM  For on call review [email or web address].

## 2021-12-22 DIAGNOSIS — I214 Non-ST elevation (NSTEMI) myocardial infarction: Secondary | ICD-10-CM | POA: Diagnosis not present

## 2021-12-22 DIAGNOSIS — U071 COVID-19: Secondary | ICD-10-CM | POA: Diagnosis not present

## 2021-12-22 DIAGNOSIS — I5043 Acute on chronic combined systolic (congestive) and diastolic (congestive) heart failure: Secondary | ICD-10-CM | POA: Diagnosis not present

## 2021-12-22 LAB — BASIC METABOLIC PANEL
Anion gap: 7 (ref 5–15)
BUN: 61 mg/dL — ABNORMAL HIGH (ref 8–23)
CO2: 22 mmol/L (ref 22–32)
Calcium: 9 mg/dL (ref 8.9–10.3)
Chloride: 108 mmol/L (ref 98–111)
Creatinine, Ser: 2.74 mg/dL — ABNORMAL HIGH (ref 0.61–1.24)
GFR, Estimated: 24 mL/min — ABNORMAL LOW (ref >60)
Glucose, Bld: 187 mg/dL — ABNORMAL HIGH (ref 70–99)
Potassium: 3.8 mmol/L (ref 3.5–5.1)
Sodium: 137 mmol/L (ref 135–145)

## 2021-12-22 LAB — GLUCOSE, CAPILLARY
Glucose-Capillary: 171 mg/dL — ABNORMAL HIGH (ref 70–99)
Glucose-Capillary: 177 mg/dL — ABNORMAL HIGH (ref 70–99)
Glucose-Capillary: 278 mg/dL — ABNORMAL HIGH (ref 70–99)
Glucose-Capillary: 241 mg/dL — ABNORMAL HIGH (ref 70–99)

## 2021-12-22 LAB — MAGNESIUM: Magnesium: 2.1 mg/dL (ref 1.7–2.4)

## 2021-12-22 NOTE — Plan of Care (Signed)
  Problem: Education: Goal: Understanding of cardiac disease, CV risk reduction, and recovery process will improve Outcome: Progressing Goal: Individualized Educational Video(s) Outcome: Progressing   Problem: Activity: Goal: Ability to tolerate increased activity will improve Outcome: Progressing   Problem: Cardiac: Goal: Ability to achieve and maintain adequate cardiovascular perfusion will improve Outcome: Progressing   Problem: Health Behavior/Discharge Planning: Goal: Ability to safely manage health-related needs after discharge will improve Outcome: Progressing   Problem: Education: Goal: Knowledge of General Education information will improve Description: Including pain rating scale, medication(s)/side effects and non-pharmacologic comfort measures Outcome: Progressing   Problem: Health Behavior/Discharge Planning: Goal: Ability to manage health-related needs will improve Outcome: Progressing   Problem: Clinical Measurements: Goal: Ability to maintain clinical measurements within normal limits will improve Outcome: Progressing Goal: Will remain free from infection Outcome: Progressing Goal: Diagnostic test results will improve Outcome: Progressing Goal: Respiratory complications will improve Outcome: Progressing Goal: Cardiovascular complication will be avoided Outcome: Progressing   Problem: Activity: Goal: Risk for activity intolerance will decrease Outcome: Progressing   Problem: Nutrition: Goal: Adequate nutrition will be maintained Outcome: Progressing   Problem: Coping: Goal: Level of anxiety will decrease Outcome: Progressing   Problem: Elimination: Goal: Will not experience complications related to bowel motility Outcome: Progressing Goal: Will not experience complications related to urinary retention Outcome: Progressing   Problem: Pain Managment: Goal: General experience of comfort will improve Outcome: Progressing   Problem:  Safety: Goal: Ability to remain free from injury will improve Outcome: Progressing   Problem: Skin Integrity: Goal: Risk for impaired skin integrity will decrease Outcome: Progressing   Problem: Education: Goal: Understanding of CV disease, CV risk reduction, and recovery process will improve Outcome: Progressing Goal: Individualized Educational Video(s) Outcome: Progressing   Problem: Activity: Goal: Ability to return to baseline activity level will improve Outcome: Progressing   Problem: Cardiovascular: Goal: Ability to achieve and maintain adequate cardiovascular perfusion will improve Outcome: Progressing Goal: Vascular access site(s) Level 0-1 will be maintained Outcome: Progressing   Problem: Health Behavior/Discharge Planning: Goal: Ability to safely manage health-related needs after discharge will improve Outcome: Progressing   Problem: Education: Goal: Ability to describe self-care measures that may prevent or decrease complications (Diabetes Survival Skills Education) will improve Outcome: Progressing Goal: Individualized Educational Video(s) Outcome: Progressing   Problem: Coping: Goal: Ability to adjust to condition or change in health will improve Outcome: Progressing   Problem: Fluid Volume: Goal: Ability to maintain a balanced intake and output will improve Outcome: Progressing   Problem: Health Behavior/Discharge Planning: Goal: Ability to identify and utilize available resources and services will improve Outcome: Progressing Goal: Ability to manage health-related needs will improve Outcome: Progressing   Problem: Metabolic: Goal: Ability to maintain appropriate glucose levels will improve Outcome: Progressing   Problem: Nutritional: Goal: Maintenance of adequate nutrition will improve Outcome: Progressing Goal: Progress toward achieving an optimal weight will improve Outcome: Progressing   Problem: Skin Integrity: Goal: Risk for impaired  skin integrity will decrease Outcome: Progressing   Problem: Tissue Perfusion: Goal: Adequacy of tissue perfusion will improve Outcome: Progressing

## 2021-12-22 NOTE — Progress Notes (Signed)
  Progress Note   Patient: Shane Holloway EHU:[id number] DOB: Sep 15, 1950 DOA: 12/10/2021     12 DOS: the patient was seen and examined on 12/22/2021   Brief hospital course: 71 year old male with past medical history of BPH, CHF, CKD, vascular dementia, CAD status post CABG and aortic valve replacement with bioprosthetic valve on Eliquis who presented to the emergency room on 7/3 with complaints of chest pain and found to have a non-STEMI as well as hypertensive urgency.  Patient was admitted to the hospitalist service and cardiology was consulted.  Eliquis stopped and started on heparin.  Echocardiogram done noted ejection fraction of 35% and grade 1 diastolic dysfunction.  Cardiology planning to take patient for left heart catheterization on 7/5 which noted severe multivessel disease.  Patient underwent interventional catheterization on 7/7 with cardiology doing an IV ultrasound-guided PCI of the SVG to the diagonal. Patient had worsening renal function on 7/8.  Started fluids. 7/9.  Renal function starting getting better, however, patient developed significant cough, COVID test come back positive.  Started steroids. Patient condition so far had improved, off oxygen, currently pending nursing home placement  Assessment and Plan: Non-STEMI. Hypertension urgency. 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 Patient is status post cardiac cath, with stent placement to SVG to diagonal.  Continue dual antiplatelet treatment with aspirin Plavix, statin. Condition has stabilized. Echocardiogram showed ejection fraction 35 to 40%, with grade 1 diastolic dysfunction. No change in treatment regimen    Acute hypoxemic respiratory failure. COVID infection. Condition improved.   Acute kidney injury on chronic kidney disease stage IIIb. Hypokalemia Metabolic acidosis. Condition stable, continue sodium bicarbonate.     Uncontrolled type 2 diabetes with  hyperglycemia. Continue current regimen, glucose still high, steroid discontinued.  We will continue to follow.   Thrombocytopenia. Improved.   Obesity with BMI 30.32.      Subjective:  Patient doing well, slept well.  Currently no short of breath or cough.  Pending nursing home placement.  Physical Exam: Vitals:   12/22/21 0500 12/22/21 0821 12/22/21 1000 12/22/21 1100  BP: (!) 141/98 (!) 133/98 (!) 137/92 111/70  Pulse: 70 73  70  Resp: '15 15 13 17  '$ Temp: 97.6 F (36.4 C) 97.8 F (36.6 C)  98.2 F (36.8 C)  TempSrc: Oral Oral  Oral  SpO2: 95% 94%    Weight:      Height:       General exam: Appears calm and comfortable  Respiratory system: Clear to auscultation. Respiratory effort normal. Cardiovascular system: S1 & S2 heard, RRR. No JVD, murmurs, rubs, gallops or clicks. No pedal edema. Gastrointestinal system: Abdomen is nondistended, soft and nontender. No organomegaly or masses felt. Normal bowel sounds heard. Central nervous system: Alert and oriented x2. No focal neurological deficits. Extremities: Symmetric 5 x 5 power. Skin: No rashes, lesions or ulcers Psychiatry: Judgement and insight appear normal. Mood & affect appropriate.   Data Reviewed:  Lab results reviewed  Family Communication:   Disposition: Status is: Inpatient Remains inpatient appropriate because: Unsafe discharge, pending nursing placement  Planned Discharge Destination: Skilled nursing facility    Time spent: 29 minutes  Author: Sharen Hones, MD 12/22/2021 12:06 PM  For on call review [email or web address].

## 2021-12-23 DIAGNOSIS — N170 Acute kidney failure with tubular necrosis: Secondary | ICD-10-CM | POA: Diagnosis not present

## 2021-12-23 DIAGNOSIS — J9601 Acute respiratory failure with hypoxia: Secondary | ICD-10-CM | POA: Diagnosis not present

## 2021-12-23 DIAGNOSIS — I5043 Acute on chronic combined systolic (congestive) and diastolic (congestive) heart failure: Secondary | ICD-10-CM | POA: Diagnosis not present

## 2021-12-23 DIAGNOSIS — I214 Non-ST elevation (NSTEMI) myocardial infarction: Secondary | ICD-10-CM | POA: Diagnosis not present

## 2021-12-23 LAB — GLUCOSE, CAPILLARY
Glucose-Capillary: 161 mg/dL — ABNORMAL HIGH (ref 70–99)
Glucose-Capillary: 212 mg/dL — ABNORMAL HIGH (ref 70–99)
Glucose-Capillary: 221 mg/dL — ABNORMAL HIGH (ref 70–99)
Glucose-Capillary: 160 mg/dL — ABNORMAL HIGH (ref 70–99)

## 2021-12-23 LAB — BASIC METABOLIC PANEL
Anion gap: 6 (ref 5–15)
BUN: 62 mg/dL — ABNORMAL HIGH (ref 8–23)
CO2: 26 mmol/L (ref 22–32)
Calcium: 8.6 mg/dL — ABNORMAL LOW (ref 8.9–10.3)
Chloride: 107 mmol/L (ref 98–111)
Creatinine, Ser: 3.05 mg/dL — ABNORMAL HIGH (ref 0.61–1.24)
GFR, Estimated: 21 mL/min — ABNORMAL LOW (ref >60)
Glucose, Bld: 161 mg/dL — ABNORMAL HIGH (ref 70–99)
Potassium: 3.8 mmol/L (ref 3.5–5.1)
Sodium: 139 mmol/L (ref 135–145)

## 2021-12-23 MED ORDER — INSULIN GLARGINE-YFGN 100 UNIT/ML ~~LOC~~ SOLN
12.0000 [IU] | Freq: Every day | SUBCUTANEOUS | Status: DC
  Administered 2021-12-24: 12 [IU] via SUBCUTANEOUS
  Filled 2021-12-23: qty 0.12

## 2021-12-23 MED ORDER — LACTATED RINGERS IV SOLN: INTRAVENOUS | Status: AC

## 2021-12-23 NOTE — TOC Progression Note (Signed)
Transition of Care [facility]) - Progression Note    Patient Details  Name: Shane Holloway MRN: [id number] Date of Birth: [birth year]  [facility] CM/SW Contact  Zigmund Daniel Dorian Pod, RN Phone Number: [phone] 12/23/2021, 12:33 PM  Clinical Narrative:    Confirmed with Magda Paganini at [facility] that pt would not be able to come to the facility until 12/26/2021 and requested Navi start on authorization for Tuesday 7/18 due to recent test.   Will update TOC team on this information.   Expected Discharge Plan: [place] Barriers to Discharge: Continued Medical Work up  Expected Discharge Plan and Services Expected Discharge Plan: [place] arrangements for the past 2 months: Single Family Home                                       Social Determinants of Health (SDOH) Interventions    Readmission Risk Interventions     No data to display

## 2021-12-23 NOTE — Progress Notes (Signed)
  Progress Note   Patient: Shane Holloway:[id number] DOB: [birth year] DOA: 12/10/2021     13 DOS: the patient was seen and examined on 12/23/2021   Brief hospital course: 71 year old male with past medical history of BPH, CHF, CKD, vascular dementia, CAD status post CABG and aortic valve replacement with bioprosthetic valve on Eliquis who presented to the emergency room on 7/3 with complaints of chest pain and found to have a non-STEMI as well as hypertensive urgency.  Patient was admitted to the hospitalist service and cardiology was consulted.  Eliquis stopped and started on heparin.  Echocardiogram done noted ejection fraction of 35% and grade 1 diastolic dysfunction.  Cardiology planning to take patient for left heart catheterization on 7/5 which noted severe multivessel disease.  Patient underwent interventional catheterization on 7/7 with cardiology doing an IV ultrasound-guided PCI of the SVG to the diagonal. Patient had worsening renal function on 7/8.  Started fluids. 7/9.  Renal function starting getting better, however, patient developed significant cough, COVID test come back positive.  Started steroids. Patient condition so far had improved, off oxygen, currently pending nursing home placement  Assessment and Plan: Non-STEMI. Hypertension urgency. 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 Patient is status post cardiac cath, with stent placement to SVG to diagonal.  Continue dual antiplatelet treatment with aspirin Plavix, statin. Condition has stabilized. Echocardiogram showed ejection fraction 35 to 40%, with grade 1 diastolic dysfunction. Patient condition stable, continue current treatment.  Patient may have some mild dehydration, give 500 mL fluids.    Acute hypoxemic respiratory failure. COVID infection. Condition improved.   Acute kidney injury on chronic kidney disease stage IIIb. Hypokalemia Metabolic acidosis. Patient had worsening  renal function today, he has mild dehydration from poor p.o. intake.  Give 500 mL fluids.     Uncontrolled type 2 diabetes with hyperglycemia. Reduce the dose of insulin glargine, patient steroids has been discontinued.   Thrombocytopenia. Improved.   Obesity with BMI 30.32.        Subjective:  Patient feels well, no complaints.  Physical Exam: Vitals:   12/22/21 2300 12/23/21 0451 12/23/21 0834 12/23/21 1115  BP: 105/71  (!) 147/93 (!) 147/96  Pulse: 65 83    Resp:  (!) 22 (!) 21 12  Temp: 98.1 F (36.7 C)   98.1 F (36.7 C)  TempSrc:    Oral  SpO2: 98% 96%  97%  Weight:      Height:       General exam: Appears calm and comfortable  Respiratory system: Clear to auscultation. Respiratory effort normal. Cardiovascular system: S1 & S2 heard, RRR. No JVD, murmurs, rubs, gallops or clicks. No pedal edema. Gastrointestinal system: Abdomen is nondistended, soft and nontender. No organomegaly or masses felt. Normal bowel sounds heard. Central nervous system: Alert and oriented x2. No focal neurological deficits. Extremities: Symmetric 5 x 5 power. Skin: No rashes, lesions or ulcers Psychiatry: Judgement and insight appear normal. Mood & affect appropriate.   Data Reviewed:  Lab results reviewed  Family Communication:   Disposition: Status is: Inpatient Remains inpatient appropriate because: Unsafe discharge  Planned Discharge Destination: Skilled nursing facility    Time spent: 35 minutes  Author: Sharen Hones, MD 12/23/2021 12:23 PM  For on call review [email or web address].

## 2021-12-24 ENCOUNTER — Ambulatory Visit: Payer: Medicare Other | Admitting: Physician Assistant

## 2021-12-24 DIAGNOSIS — U071 COVID-19: Secondary | ICD-10-CM | POA: Diagnosis not present

## 2021-12-24 DIAGNOSIS — I214 Non-ST elevation (NSTEMI) myocardial infarction: Secondary | ICD-10-CM | POA: Diagnosis not present

## 2021-12-24 DIAGNOSIS — I5043 Acute on chronic combined systolic (congestive) and diastolic (congestive) heart failure: Secondary | ICD-10-CM | POA: Diagnosis not present

## 2021-12-24 LAB — CBC
HCT: 40.8 % (ref 39.0–52.0)
Hemoglobin: 13.6 g/dL (ref 13.0–17.0)
MCH: 28.6 pg (ref 26.0–34.0)
MCHC: 33.3 g/dL (ref 30.0–36.0)
MCV: 85.9 fL (ref 80.0–100.0)
Platelets: 157 10*3/uL (ref 150–400)
RBC: 4.75 MIL/uL (ref 4.22–5.81)
RDW: 13.2 % (ref 11.5–15.5)
WBC: 8.7 10*3/uL (ref 4.0–10.5)
nRBC: 0 % (ref 0.0–0.2)

## 2021-12-24 LAB — BASIC METABOLIC PANEL
Anion gap: 8 (ref 5–15)
BUN: 48 mg/dL — ABNORMAL HIGH (ref 8–23)
CO2: 22 mmol/L (ref 22–32)
Calcium: 8.7 mg/dL — ABNORMAL LOW (ref 8.9–10.3)
Chloride: 106 mmol/L (ref 98–111)
Creatinine, Ser: 2.56 mg/dL — ABNORMAL HIGH (ref 0.61–1.24)
GFR, Estimated: 26 mL/min — ABNORMAL LOW (ref >60)
Glucose, Bld: 129 mg/dL — ABNORMAL HIGH (ref 70–99)
Potassium: 3.6 mmol/L (ref 3.5–5.1)
Sodium: 136 mmol/L (ref 135–145)

## 2021-12-24 LAB — MAGNESIUM: Magnesium: 2.1 mg/dL (ref 1.7–2.4)

## 2021-12-24 LAB — GLUCOSE, CAPILLARY: Glucose-Capillary: 160 mg/dL — ABNORMAL HIGH (ref 70–99)

## 2021-12-24 NOTE — Discharge Summary (Signed)
Physician Discharge Summary   Patient: Shane Holloway MRN: [id number] DOB: 01-Mar-1951  Admit date:     12/10/2021  Discharge date: 12/24/21  Discharge Physician: Sharen Hones   PCP: Caryl Never, MD   Patient left hospital against medical advice. I was only notified by RN after patient already left.   Discharge Diagnoses: Principal Problem:   NSTEMI (non-ST elevated myocardial infarction) ([place]) Active Problems:   Hypertensive urgency   Acute on chronic combined systolic and diastolic CHF (congestive heart failure) (HCC)   Vascular dementia without behavioral disturbance (HCC)   Chronic kidney disease, stage 3b (HCC)   S/P aortic valve replacement with bioprosthetic valve   Uncontrolled type 2 diabetes mellitus with hyperglycemia, without long-term current use of insulin (HCC)   Benign prostatic hyperplasia   Obesity (BMI 30-39.9)   Acute renal failure with acute tubular necrosis superimposed on stage 3b chronic kidney disease (HCC)   Thrombocytopenia (HCC)   COVID-19 virus infection   Acute respiratory failure with hypoxia (HCC)   B12 deficiency anemia  Resolved Problems:   * No resolved hospital problems. *  Hospital Course: 71 year old male with past medical history of BPH, CHF, CKD, vascular dementia, CAD status post CABG and aortic valve replacement with bioprosthetic valve on Eliquis who presented to the emergency room on 7/3 with complaints of chest pain and found to have a non-STEMI as well as hypertensive urgency.  Patient was admitted to the hospitalist service and cardiology was consulted.  Eliquis stopped and started on heparin.  Echocardiogram done noted ejection fraction of 35% and grade 1 diastolic dysfunction.  Cardiology planning to take patient for left heart catheterization on 7/5 which noted severe multivessel disease.  Patient underwent interventional catheterization on 7/7 with cardiology doing an IV ultrasound-guided PCI of the SVG to the  diagonal. Patient had worsening renal function on 7/8.  Started fluids. 7/9.  Renal function starting getting better, however, patient developed significant cough, COVID test come back positive.  Started steroids. Patient condition so far had improved, off oxygen, currently pending nursing home placement  Assessment and Plan:  Non-STEMI. Hypertension urgency. 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 Patient is status post cardiac cath, with stent placement to SVG to diagonal.  Continue dual antiplatelet treatment with aspirin Plavix, statin. Condition has stabilized. Echocardiogram showed ejection fraction 35 to 40%, with grade 1 diastolic dysfunction. Patient condition stabilized.     Acute hypoxemic respiratory failure. COVID infection. Condition improved.   Acute kidney injury on chronic kidney disease stage IIIb. Hypokalemia Metabolic acidosis. Improved     Uncontrolled type 2 diabetes with hyperglycemia. Glucose is better, patient steroids has been discontinued.   Thrombocytopenia. Improved.   Obesity with BMI 30.32.      Consultants: Cardiology Procedures performed: Heart cath  Disposition:  Left AMA Diet recommendation:   DISCHARGE MEDICATION:   Follow-up Information     Jorge Mandril, MD Follow up in 1 week(s).   Specialty: Internal Medicine Contact information: [address] [place] [address] [phone]                Discharge Exam: Danley Danker Weights   12/10/21 1757 12/21/21 0508  Weight: 105.7 kg 100.7 kg   Not able to exam patient  Condition at discharge: fair  The results of significant diagnostics from this hospitalization (including imaging, microbiology, ancillary and laboratory) are listed below for reference.   Imaging Studies: DG Chest Port 1 View  Result Date: 12/16/2021  CLINICAL DATA:  COVID positive.  Chest pain. EXAM: PORTABLE CHEST 1 VIEW COMPARISON:   12/10/2021. FINDINGS: Stable changes from prior cardiac surgery and valve replacement. Cardiac silhouette is normal in size. No mediastinal or hilar masses. Clear lungs.  No pleural effusion or pneumothorax. Skeletal structures are grossly intact. IMPRESSION: No active disease. Electronically Signed   By: Lajean Manes M.D.   On: 12/16/2021 14:46   US RENAL  Result Date: 12/15/2021 CLINICAL DATA:  Acute tubular necrosis EXAM: RENAL / URINARY TRACT ULTRASOUND COMPLETE COMPARISON:  None Available. FINDINGS: Right Kidney: Renal measurements: 12.0 x 6.9 x 6.1 cm = volume: 265 mL. Mildly increased echotexture. No mass or hydronephrosis. Left Kidney: Renal measurements: 11.8 x 6.0 x 5.3 cm = volume: 196 mL. Mildly increased echotexture. No mass or hydronephrosis. Bladder: Appears normal for degree of bladder distention. Other: None. IMPRESSION: Increased echotexture suggesting chronic medical renal disease. No acute findings. Electronically Signed   By: Rolm Baptise M.D.   On: 12/15/2021 19:36   CARDIAC CATHETERIZATION  Result Date: 12/14/2021   Dist LM lesion is 50% stenosed.   Ost LAD to Prox LAD lesion is 85% stenosed.   Mid LAD-1 lesion is 100% stenosed.   Mid LAD-2 lesion is 70% stenosed.   Dist LAD lesion is 60% stenosed.   Prox Cx lesion is 70% stenosed.   Dist RCA lesion is 100% stenosed.   Prox RCA lesion is 80% stenosed.   Mid RCA to Dist RCA lesion is 80% stenosed.   Prox Graft lesion is 60% stenosed.   1st Diag lesion is 100% stenosed.   1st Mrg-2 lesion is 50% stenosed.   1st Mrg-1 lesion is 99% stenosed.   RPDA lesion is 99% stenosed.   Mid Graft lesion is 90% stenosed.   A drug-eluting stent was successfully placed using a STENT ONYX FRONTIER [id number].   Post intervention, there is a 0% residual stenosis.   SVG and is large.   SVG and is large.   LIMA and is normal in caliber. Successful IVUS guided direct stenting of SVG to diagonal. Recommendations: Gentle hydration postprocedure.  25 mL of contrast  was used. Continue dual antiplatelet therapy for now. Resume Eliquis tonight and will likely discontinue aspirin before hospital discharge to minimize the risk of bleeding on triple therapy.   CARDIAC CATHETERIZATION  Result Date: 12/12/2021 Conclusions: Severe native coronary artery disease, as detailed below. Widely patent LIMA-LAD. Patent SVG Y-graft to OM1 and D1.  There is mild diffuse disease involving the segment to OM1.  There is a focal 90% stenosis in the segment to D1 that most likely represents the culprit for the patient's NSTEMI. Patent SVG-rPDA with diffuse graft ectasia and 60% tubular stenosis in the proximal/mid graft. Upper normal left and right heart filling pressures. Discordant Fick and thermodilution cardiac output (Fick CO/CI 4.7/2.1; thermodilution CO/CI 3.1/1.3).  Question significant TR contributing to discrepancy. Recommendations: Consider staged PCI to SVG-D1 as renal function allows as soon as Friday.  Load with clopidogrel if decision is made to move forward with PCI. Start isosorbide mononitrate in an attempt to wean off nitroglycerin infusion. Restart IV heparin 8 hours after hemostasis was achieved at the end of the case.  Transition back to apixaban once no further invasive procedures are planned. Consider TEE to better evaluate aortic valve prosthesis as well as tricuspid valve. Aggressive secondary prevention of coronary artery disease. Nelva Bush, MD [facility]  ECHOCARDIOGRAM COMPLETE  Result Date: 12/11/2021    ECHOCARDIOGRAM REPORT  Patient Name:   QADIR FOLKS Date of Exam: 12/11/2021 Medical Rec #:  [id number]         Height:       73.5 in Accession #:    [id number]        Weight:       233.0 lb Date of Birth:  19-Feb-1951          BSA:          2.308 m Patient Age:    35 years          BP:           155/111 mmHg Patient Gender: M                 HR:           85 bpm. Exam Location:  [facility] Procedure: 2D Echo and Intracardiac Opacification Agent Indications:      NSTEMI I21.4  History:         Patient has no prior history of Echocardiogram examinations.  Sonographer:     Kathlen Brunswick RDCS Referring Phys:  [id number] [place] Diagnosing Phys: Ida Rogue MD  Sonographer Comments: Technically difficult study due to poor echo windows, patient is morbidly obese, no subcostal window and suboptimal apical window. Image acquisition challenging due to patient body habitus. IMPRESSIONS  1. Left ventricular ejection fraction, by estimation, is 35 to 40%. The left ventricle has moderately decreased function. The left ventricle has no regional wall motion abnormalities. There is mild left ventricular hypertrophy. Left ventricular diastolic parameters are consistent with Grade I diastolic dysfunction (impaired relaxation).  2. Right ventricular systolic function is normal. The right ventricular size is normal. Tricuspid regurgitation signal is inadequate for assessing PA pressure.  3. The mitral valve is normal in structure. No evidence of mitral valve regurgitation. No evidence of mitral stenosis.  4. The aortic valve was not well visualized. Aortic valve regurgitation is not visualized. No aortic stenosis is present.  5. The inferior vena cava is normal in size with greater than 50% respiratory variability, suggesting right atrial pressure of 3 mmHg. FINDINGS  Left Ventricle: Left ventricular ejection fraction, by estimation, is 35 to 40%. The left ventricle has moderately decreased function. The left ventricle has no regional wall motion abnormalities. Definity contrast agent was given IV to delineate the left ventricular endocardial borders. The left ventricular internal cavity size was normal in size. There is mild left ventricular hypertrophy. Left ventricular diastolic parameters are consistent with Grade I diastolic dysfunction (impaired relaxation). Right Ventricle: The right ventricular size is normal. No increase in right ventricular wall thickness. Right ventricular  systolic function is normal. Tricuspid regurgitation signal is inadequate for assessing PA pressure. Left Atrium: Left atrial size was normal in size. Right Atrium: Right atrial size was normal in size. Pericardium: There is no evidence of pericardial effusion. Mitral Valve: The mitral valve is normal in structure. No evidence of mitral valve regurgitation. No evidence of mitral valve stenosis. Tricuspid Valve: The tricuspid valve is normal in structure. Tricuspid valve regurgitation is not demonstrated. No evidence of tricuspid stenosis. Aortic Valve: The aortic valve was not well visualized. Aortic valve regurgitation is not visualized. No aortic stenosis is present. Aortic valve peak gradient measures 1.9 mmHg. Pulmonic Valve: The pulmonic valve was normal in structure. Pulmonic valve regurgitation is not visualized. No evidence of pulmonic stenosis. Aorta: The aortic root is normal in size and structure. Venous: The inferior vena cava is normal in size  with greater than 50% respiratory variability, suggesting right atrial pressure of 3 mmHg. IAS/Shunts: No atrial level shunt detected by color flow Doppler.  LEFT VENTRICLE PLAX 2D LVIDd:         5.93 cm      Diastology LVIDs:         4.17 cm      LV e' medial:    5.44 cm/s LV PW:         1.26 cm      LV E/e' medial:  8.4 LV IVS:        1.33 cm      LV e' lateral:   6.31 cm/s                             LV E/e' lateral: 7.3  LV Volumes (MOD) LV vol d, MOD A4C: 116.0 ml LV vol s, MOD A4C: 61.5 ml LV SV MOD A4C:     116.0 ml RIGHT VENTRICLE RV Basal diam:  3.27 cm RV S prime:     12.80 cm/s LEFT ATRIUM         Index LA diam:    3.50 cm 1.52 cm/m  AORTIC VALVE              PULMONIC VALVE AV Vmax:      69.20 cm/s  PV Vmax:       0.96 m/s AV Peak Grad: 1.9 mmHg    PV Peak grad:  3.7 mmHg LVOT Vmax:    73.80 cm/s LVOT Vmean:   54.500 cm/s LVOT VTI:     0.121 m MITRAL VALVE MV Area (PHT): 3.81 cm    SHUNTS MV Decel Time: 199 msec    Systemic VTI: 0.12 m MV E velocity:  45.80 cm/s MV A velocity: 70.70 cm/s MV E/A ratio:  0.65 Ida Rogue MD Electronically signed by Ida Rogue MD Signature Date/Time: 12/11/2021/12:36:53 PM    Final    DG Chest 2 View  Result Date: 12/10/2021 CLINICAL DATA:  cp EXAM: CHEST - 2 VIEW COMPARISON:  Chest x-ray 05/13/2021 FINDINGS: The heart and mediastinal contours are unchanged. Aortic calcification. No focal consolidation. No pulmonary edema. No pleural effusion. No pneumothorax. No acute osseous abnormality.  Intact sternotomy wires. IMPRESSION: 1. No active cardiopulmonary disease. 2.  Aortic Atherosclerosis (ICD10-I70.0). Electronically Signed   By: Iven Finn M.D.   On: 12/10/2021 18:25    Microbiology: Results for orders placed or performed during the hospital encounter of 12/10/21  SARS Coronavirus 2 by RT PCR (hospital order, performed in [facility] hospital lab) *cepheid single result test* Anterior Nasal Swab     Status: Abnormal   Collection Time: 12/16/21  8:49 AM   Specimen: Anterior Nasal Swab  Result Value Ref Range Status   SARS Coronavirus 2 by RT PCR POSITIVE (A) NEGATIVE Final    Comment: (NOTE) SARS-CoV-2 target nucleic acids are DETECTED  SARS-CoV-2 RNA is generally detectable in upper respiratory specimens  during the acute phase of infection.  Positive results are indicative  of the presence of the identified virus, but do not rule out bacterial infection or co-infection with other pathogens not detected by the test.  Clinical correlation with patient history and  other diagnostic information is necessary to determine patient infection status.  The expected result is negative.  Fact Sheet for Patients:   https://www.patel.info/   Fact Sheet for Healthcare Providers:   https://hall.com/    This test  is not yet approved or cleared by the [place] and  has been authorized for detection and/or diagnosis of SARS-CoV-2 by FDA under an Emergency  Use Authorization (EUA).  This EUA will remain in effect (meaning this test can be used) for the duration of  the COVID-19 declaration under Section 564(b)(1)  of the Act, 21 U.S.C. section 360-bbb-3(b)(1), unless the authorization is terminated or revoked sooner.   Performed at [facility], [address] [place], [place] [address]     Labs: CBC: Recent Labs  Lab 12/19/21 0346 12/21/21 0435 12/24/21 0417  WBC 6.9 8.3 8.7  HGB 12.8* 12.6* 13.6  HCT 38.2* 37.0* 40.8  MCV 85.7 83.9 85.9  PLT 137* 149* 193   Basic Metabolic Panel: Recent Labs  Lab 12/19/21 0346 12/21/21 0435 12/22/21 0452 12/23/21 0416 12/24/21 0417  NA 136 135 137 139 136  K 4.3 4.1 3.8 3.8 3.6  CL 107 107 108 107 106  CO2 21* '22 22 26 22  '$ GLUCOSE 305* 274* 187* 161* 129*  BUN 61* 60* 61* 62* 48*  CREATININE 2.98* 2.66* 2.74* 3.05* 2.56*  CALCIUM 9.1 9.1 9.0 8.6* 8.7*  MG  --  2.1 2.1  --  2.1   Liver Function Tests: No results for input(s): "AST", "ALT", "ALKPHOS", "BILITOT", "PROT", "ALBUMIN" in the last 168 hours. CBG: Recent Labs  Lab 12/23/21 0836 12/23/21 1115 12/23/21 1650 12/23/21 2205 12/24/21 0844  GLUCAP 161* 212* 221* 160* 160*    Discharge time spent: less than 30 minutes.  Signed: Sharen Hones, MD [facility] 12/24/2021

## 2021-12-24 NOTE — TOC Transition Note (Signed)
Transition of Care [facility]) - CM/SW Discharge Note   Patient Details  Name: OLEN EAVES MRN: [id number] Date of Birth: [birth year]  [facility] CM/SW Contact:  Candie Chroman, LCSW Phone Number: 12/24/2021, 10:25 AM   Clinical Narrative:  Patient left AMA. [facility] admissions coordinator is aware. No further concerns. CSW signing off.   Final next level of care: Home/Self Care Barriers to Discharge: Patient left Against Medical Advice [facility])   Patient Goals and CMS Choice Patient states their goals for this hospitalization and ongoing recovery are:: to go home CMS Medicare.gov Compare Post Acute Care list provided to:: Patient Choice offered to / list presented to : Patient  Discharge Placement                    Patient and family notified of of transfer: 12/24/21  Discharge Plan and Services                                     Social Determinants of Health (SDOH) Interventions     Readmission Risk Interventions     No data to display

## 2021-12-24 NOTE — Progress Notes (Signed)
Patient AMA left, Sharen Hones, MD notified. IV removed, AMA paper in chart.

## 2021-12-24 NOTE — Progress Notes (Signed)
Immodium given for loose stools

## 2022-02-28 ENCOUNTER — Encounter: Payer: Medicare Other | Attending: Internal Medicine

## 2022-02-28 ENCOUNTER — Other Ambulatory Visit: Payer: Self-pay

## 2022-02-28 DIAGNOSIS — I5020 Unspecified systolic (congestive) heart failure: Secondary | ICD-10-CM | POA: Diagnosis present

## 2022-02-28 DIAGNOSIS — Z955 Presence of coronary angioplasty implant and graft: Secondary | ICD-10-CM

## 2022-02-28 DIAGNOSIS — I214 Non-ST elevation (NSTEMI) myocardial infarction: Secondary | ICD-10-CM

## 2022-02-28 DIAGNOSIS — I5022 Chronic systolic (congestive) heart failure: Secondary | ICD-10-CM | POA: Diagnosis present

## 2022-02-28 NOTE — Progress Notes (Signed)
Virtual Visit completed. Patient informed on EP and RD appointment and 6 Minute walk test. Patient also informed of patient health questionnaires on My Chart. Patient Verbalizes understanding. Visit diagnosis can be found in [facility] 12/10/2021.

## 2022-03-11 ENCOUNTER — Encounter: Payer: Medicare Other | Attending: Internal Medicine | Admitting: *Deleted

## 2022-03-11 VITALS — Ht 73.7 in | Wt 227.8 lb

## 2022-03-11 DIAGNOSIS — I252 Old myocardial infarction: Secondary | ICD-10-CM | POA: Insufficient documentation

## 2022-03-11 DIAGNOSIS — I209 Angina pectoris, unspecified: Secondary | ICD-10-CM | POA: Diagnosis not present

## 2022-03-11 DIAGNOSIS — I214 Non-ST elevation (NSTEMI) myocardial infarction: Secondary | ICD-10-CM

## 2022-03-11 DIAGNOSIS — Z48812 Encounter for surgical aftercare following surgery on the circulatory system: Secondary | ICD-10-CM | POA: Insufficient documentation

## 2022-03-11 DIAGNOSIS — Z955 Presence of coronary angioplasty implant and graft: Secondary | ICD-10-CM | POA: Insufficient documentation

## 2022-03-11 NOTE — Progress Notes (Signed)
Cardiac Individual Treatment Plan  Patient Details  Name: Shane Holloway MRN: [id number] Date of Birth: 1951-06-07 Referring Provider:   Flowsheet Row Cardiac Rehab from 03/11/2022 in [facility] Cardiac and Pulmonary Rehab  Referring Provider Bernell List MD       Initial Encounter Date:  Flowsheet Row Cardiac Rehab from 03/11/2022 in [facility] Cardiac and Pulmonary Rehab  Date 03/11/22       Visit Diagnosis: NSTEMI (non-ST elevated myocardial infarction) [facility])  Status post coronary artery stent placement  Patient's Home Medications on Admission:  Current Outpatient Medications:    amiodarone (PACERONE) 200 MG tablet, Take 1 tablet (200 mg total) by mouth daily., Disp: 30 tablet, Rfl: 0   amiodarone (PACERONE) 200 MG tablet, Take 1 tablet by mouth daily., Disp: , Rfl:    amLODipine (NORVASC) 2.5 MG tablet, Take 2.5 mg by mouth daily. (Patient not taking: Reported on 02/28/2022), Disp: , Rfl:    amLODipine (NORVASC) 2.5 MG tablet, Take 1 tablet by mouth daily., Disp: , Rfl:    apixaban (ELIQUIS) 5 MG TABS tablet, Take by mouth., Disp: , Rfl:    aspirin EC 81 MG tablet, Take 1 tablet by mouth daily., Disp: , Rfl:    atorvastatin (LIPITOR) 80 MG tablet, Take 80 mg by mouth daily., Disp: , Rfl:    benzonatate (TESSALON PERLES) 100 MG capsule, Take 1 capsule (100 mg total) by mouth 3 (three) times daily as needed for cough. (Patient not taking: Reported on 02/28/2022), Disp: 20 capsule, Rfl: 0   Cholecalciferol 125 MCG (5000 UT) TABS, Take 1 tablet by mouth daily., Disp: , Rfl:    cyclobenzaprine (FLEXERIL) 5 MG tablet, Take 5 mg by mouth 2 (two) times daily. (Patient not taking: Reported on 02/28/2022), Disp: , Rfl:    ELIQUIS 5 MG TABS tablet, Take 5 mg by mouth 2 (two) times daily., Disp: , Rfl:    empagliflozin (JARDIANCE) 10 MG TABS tablet, Take 1 tablet by mouth daily., Disp: , Rfl:    finasteride (PROSCAR) 5 MG tablet, Take 5 mg by mouth daily. (Patient not taking: Reported on 02/28/2022),  Disp: , Rfl:    furosemide (LASIX) 20 MG tablet, Take 1 tablet (20 mg total) by mouth daily., Disp: 30 tablet, Rfl: 3   glipiZIDE (GLUCOTROL XL) 10 MG 24 hr tablet, Take 10 mg by mouth daily., Disp: , Rfl:    glipiZIDE (GLUCOTROL XL) 10 MG 24 hr tablet, Take 1 tablet by mouth daily. (Patient not taking: Reported on 02/28/2022), Disp: , Rfl:    JARDIANCE 10 MG TABS tablet, Take 10 mg by mouth daily., Disp: , Rfl:    losartan (COZAAR) 100 MG tablet, Take 100 mg by mouth daily. (Patient not taking: Reported on 02/28/2022), Disp: , Rfl:    losartan (COZAAR) 100 MG tablet, Take 1 tablet by mouth daily., Disp: , Rfl:    metoprolol tartrate (LOPRESSOR) 50 MG tablet, Take 1 tablet (50 mg total) by mouth 2 (two) times daily. (Patient not taking: Reported on 02/28/2022), Disp: 60 tablet, Rfl: 0   metoprolol tartrate (LOPRESSOR) 50 MG tablet, Take by mouth., Disp: , Rfl:    nitroGLYCERIN (NITROSTAT) 0.4 MG SL tablet, Place under the tongue., Disp: , Rfl:    tamsulosin (FLOMAX) 0.4 MG CAPS capsule, Take by mouth., Disp: , Rfl:    Tamsulosin HCl (FLOMAX) 0.4 MG CAPS, TAKE ONE CAPSULE BY MOUTH EVERY DAY (Patient not taking: Reported on 02/28/2022), Disp: 30 capsule, Rfl: 2  Past Medical History: Past Medical History:  Diagnosis Date   Allergy    Aortic stenosis    Arthritis    hands   BPH (benign prostatic hyperplasia)    CHF (congestive heart failure) (HCC)    CKD (chronic kidney disease)    Diabetes mellitus    Dyspnea    Hyperlipidemia    Hypertension    Myocardial infarction ([place]) 06/10/2002   Stroke ([place]) 09/2020   Some memory deficits   Vertigo    Wears dentures    full upper    Tobacco Use: Social History   Tobacco Use  Smoking Status Former   Packs/day: 0.25   Years: 4.00   Total pack years: 1.00   Types: Cigarettes   Quit date: 1995   Years since quitting: 28.7  Smokeless Tobacco Former   Types: Chew   Quit date: 1995    Labs: Review Flowsheet       Latest Ref Rng &  Units 07/27/2007 07/29/2007 08/10/2011 12/11/2021  Labs for ITP Cardiac and Pulmonary Rehab  Cholestrol 0 - 200 mg/dL - - 178  171   LDL (calc) 0 - 99 mg/dL - - 117  UNABLE TO CALCULATE IF TRIGLYCERIDE OVER 400 mg/dL   Direct LDL 0 - 99 mg/dL - - - 58.1   HDL-C >40 mg/dL - - 35  28   Trlycerides <150 mg/dL - - 130  509   Hemoglobin A1c 4.8 - 5.6 % 7.2 (NOTE)   The ADA recommends the following therapeutic goals for glycemic   control related to Hgb A1C measurement:   Goal of Therapy:   < 7.0% Hgb A1C   Action Suggested:  > 8.0% Hgb A1C   Ref:  Diabetes Care, 22, Suppl. 1, 1999  - 7.3  8.9   PH, Arterial - 7.423  7.380  7.363  7.391  7.351  - -  PCO2 arterial - 38.6  37.7  40.2  39.9  49.2  - -  Bicarbonate - 24.8  22.3  22.6  24.4  27.2  - -  TCO2 - 26.0  '23  24  26  29  '$ - -  Acid-base deficit - - 3.0  2.0  1.0  - -  O2 Saturation - 96.9  95.0  91.0  100.0  - -     Exercise Target Goals: Exercise Program Goal: Individual exercise prescription set using results from initial 6 min walk test and THRR while considering  patient's activity barriers and safety.   Exercise Prescription Goal: Initial exercise prescription builds to 30-45 minutes a day of aerobic activity, 2-3 days per week.  Home exercise guidelines will be given to patient during program as part of exercise prescription that the participant will acknowledge.   Education: Aerobic Exercise: - Group verbal and visual presentation on the components of exercise prescription. Introduces F.I.T.T principle from ACSM for exercise prescriptions.  Reviews F.I.T.T. principles of aerobic exercise including progression. Written material given at graduation. Flowsheet Row Cardiac Rehab from 03/11/2022 in [facility] Cardiac and Pulmonary Rehab  Education need identified 03/11/22       Education: Resistance Exercise: - Group verbal and visual presentation on the components of exercise prescription. Introduces F.I.T.T principle from ACSM for exercise  prescriptions  Reviews F.I.T.T. principles of resistance exercise including progression. Written material given at graduation.    Education: Exercise & Equipment Safety: - Individual verbal instruction and demonstration of equipment use and safety with use of the equipment. Flowsheet Row Cardiac Rehab from 03/11/2022 in [facility] Cardiac and Pulmonary Rehab  Date 02/28/22  Educator [facility]  Instruction Review Code 1- Verbalizes Understanding       Education: Exercise Physiology & General Exercise Guidelines: - Group verbal and written instruction with models to review the exercise physiology of the cardiovascular system and associated critical values. Provides general exercise guidelines with specific guidelines to those with heart or lung disease.    Education: Flexibility, Balance, Mind/Body Relaxation: - Group verbal and visual presentation with interactive activity on the components of exercise prescription. Introduces F.I.T.T principle from ACSM for exercise prescriptions. Reviews F.I.T.T. principles of flexibility and balance exercise training including progression. Also discusses the mind body connection.  Reviews various relaxation techniques to help reduce and manage stress (i.e. Deep breathing, progressive muscle relaxation, and visualization). Balance handout provided to take home. Written material given at graduation.   Activity Barriers & Risk Stratification:  Activity Barriers & Cardiac Risk Stratification - 03/11/22 1222       Activity Barriers & Cardiac Risk Stratification   Activity Barriers Deconditioning;Muscular Weakness;Balance Concerns;History of Falls;Shortness of Breath;Other (comment);Assistive Device    Comments cognitive impairment, Hx CVA    Cardiac Risk Stratification High             6 Minute Walk:  6 Minute Walk     Row Name 03/11/22 1211         6 Minute Walk   Phase Initial     Distance 860 feet     Walk Time 6 minutes     # of Rest Breaks 0      MPH 1.63     METS 1.85     RPE 9     Perceived Dyspnea  1     VO2 Peak 6.47     Symptoms Yes (comment)     Comments SOB, leg pain 5/10     Resting HR 80 bpm     Resting BP 114/62     Resting Oxygen Saturation  96 %     Exercise Oxygen Saturation  during 6 min walk 96 %     Max Ex. HR 95 bpm     Max Ex. BP 126/74     2 Minute Post BP 122/70              Oxygen Initial Assessment:   Oxygen Re-Evaluation:   Oxygen Discharge (Final Oxygen Re-Evaluation):   Initial Exercise Prescription:  Initial Exercise Prescription - 03/11/22 1200       Date of Initial Exercise RX and Referring Provider   Date 03/11/22    Referring Provider Bernell List MD      Oxygen   Maintain Oxygen Saturation 88% or higher      Treadmill   MPH 1.5    Grade 0    Minutes 15    METs 2.15      NuStep   Level 2    SPM 80    Minutes 15    METs 2      Track   Laps 22    Minutes 15    METs 2.2      Prescription Details   Frequency (times per week) 3    Duration Progress to 30 minutes of continuous aerobic without signs/symptoms of physical distress      Intensity   THRR 40-80% of Max Heartrate 107-134    Ratings of Perceived Exertion 11-13    Perceived Dyspnea 0-4      Progression   Progression Continue to progress workloads to maintain intensity without  signs/symptoms of physical distress.      Resistance Training   Training Prescription Yes    Weight 4 lb    Reps 10-15             Perform Capillary Blood Glucose checks as needed.  Exercise Prescription Changes:   Exercise Prescription Changes     Row Name 03/11/22 1200             Response to Exercise   Blood Pressure (Admit) 114/62       Blood Pressure (Exercise) 126/74       Blood Pressure (Exit) 122/70       Heart Rate (Admit) 80 bpm       Heart Rate (Exercise) 95 bpm       Heart Rate (Exit) 80 bpm       Oxygen Saturation (Admit) 96 %       Oxygen Saturation (Exercise) 96 %       Rating of  Perceived Exertion (Exercise) 9       Perceived Dyspnea (Exercise) 1       Symptoms SOB, leg pain 5/10       Comments walk test results                Exercise Comments:   Exercise Goals and Review:   Exercise Goals     Row Name 03/11/22 1225             Exercise Goals   Increase Physical Activity Yes       Intervention Develop an individualized exercise prescription for aerobic and resistive training based on initial evaluation findings, risk stratification, comorbidities and participant's personal goals.;Provide advice, education, support and counseling about physical activity/exercise needs.       Expected Outcomes Short Term: Attend rehab on a regular basis to increase amount of physical activity.;Long Term: Add in home exercise to make exercise part of routine and to increase amount of physical activity.;Long Term: Exercising regularly at least 3-5 days a week.       Increase Strength and Stamina Yes       Intervention Provide advice, education, support and counseling about physical activity/exercise needs.;Develop an individualized exercise prescription for aerobic and resistive training based on initial evaluation findings, risk stratification, comorbidities and participant's personal goals.       Expected Outcomes Short Term: Increase workloads from initial exercise prescription for resistance, speed, and METs.;Short Term: Perform resistance training exercises routinely during rehab and add in resistance training at home;Long Term: Improve cardiorespiratory fitness, muscular endurance and strength as measured by increased METs and functional capacity (6MWT)       Able to understand and use rate of perceived exertion (RPE) scale Yes       Intervention Provide education and explanation on how to use RPE scale       Expected Outcomes Short Term: Able to use RPE daily in rehab to express subjective intensity level;Long Term:  Able to use RPE to guide intensity level when  exercising independently       Able to understand and use Dyspnea scale Yes       Intervention Provide education and explanation on how to use Dyspnea scale       Expected Outcomes Short Term: Able to use Dyspnea scale daily in rehab to express subjective sense of shortness of breath during exertion;Long Term: Able to use Dyspnea scale to guide intensity level when exercising independently       Knowledge and understanding of  Target Heart Rate Range (THRR) Yes       Intervention Provide education and explanation of THRR including how the numbers were predicted and where they are located for reference       Expected Outcomes Short Term: Able to state/look up THRR;Short Term: Able to use daily as guideline for intensity in rehab;Long Term: Able to use THRR to govern intensity when exercising independently       Able to check pulse independently Yes       Intervention Provide education and demonstration on how to check pulse in carotid and radial arteries.;Review the importance of being able to check your own pulse for safety during independent exercise       Expected Outcomes Short Term: Able to explain why pulse checking is important during independent exercise;Long Term: Able to check pulse independently and accurately       Understanding of Exercise Prescription Yes       Intervention Provide education, explanation, and written materials on patient's individual exercise prescription       Expected Outcomes Short Term: Able to explain program exercise prescription;Long Term: Able to explain home exercise prescription to exercise independently                Exercise Goals Re-Evaluation :   Discharge Exercise Prescription (Final Exercise Prescription Changes):  Exercise Prescription Changes - 03/11/22 1200       Response to Exercise   Blood Pressure (Admit) 114/62    Blood Pressure (Exercise) 126/74    Blood Pressure (Exit) 122/70    Heart Rate (Admit) 80 bpm    Heart Rate (Exercise)  95 bpm    Heart Rate (Exit) 80 bpm    Oxygen Saturation (Admit) 96 %    Oxygen Saturation (Exercise) 96 %    Rating of Perceived Exertion (Exercise) 9    Perceived Dyspnea (Exercise) 1    Symptoms SOB, leg pain 5/10    Comments walk test results             Nutrition:  Target Goals: Understanding of nutrition guidelines, daily intake of sodium '1500mg'$ , cholesterol '200mg'$ , calories 30% from fat and 7% or less from saturated fats, daily to have 5 or more servings of fruits and vegetables.  Education: All About Nutrition: -Group instruction provided by verbal, written material, interactive activities, discussions, models, and posters to present general guidelines for heart healthy nutrition including fat, fiber, MyPlate, the role of sodium in heart healthy nutrition, utilization of the nutrition label, and utilization of this knowledge for meal planning. Follow up email sent as well. Written material given at graduation.   Biometrics:  Pre Biometrics - 03/11/22 1225       Pre Biometrics   Height 6' 1.7" (1.872 m)    Weight 227 lb 12.8 oz (103.3 kg)    BMI (Calculated) 29.49    Single Leg Stand 1.21 seconds              Nutrition Therapy Plan and Nutrition Goals:  Nutrition Therapy & Goals - 03/11/22 1148       Nutrition Therapy   Diet Heart healthy, low Na, T2DM friendly    Protein (specify units) 85-90g    Fiber 30 grams    Whole Grain Foods 3 servings    Saturated Fats 17 max. grams    Fruits and Vegetables 8 servings/day    Sodium 1.5 grams      Personal Nutrition Goals   Nutrition Goal ST: include more fiber with  meals - fruits/vegetables, whole grains. Limit sodium <1.5g/day LT: Manage A1C < 7, limited saturated fat <17g/day, limit sodium < 1.5g/day, include at least 30g of fiber/day.    Comments 71 y.o. M admitted to cardiac rehab s/p NSTEMI. PMHx includes CAD, CVA (earlier this year), cognitive impairment, HTN, T2DM (A1C 7.8), HLD, HFrEF, SVT, Aortic  Stenosis, CKD stg 3. PSHx includes CABG x3 (2009), CABG x2 (2015). Relevant medications includes lipitor, glipizide, vit D3, jardiance. Quame reports that he has had increased swelling for a while now and recently his weight had gone up (he reports this was a sudden weight gain >5 pounds); highly encouraged him to speak to his MD regarding this as this is likely fluid retention especially as he has history with CKD stg 3 and HFrEF. Discussed limiting sodium to '1500mg'$  as this can help with fluid build-up as well; he reports using many canned and pre-prepared food items which can have a lot of sodium and well as how to build easy meals (sandwiches, eggs with toast and berries, microwaved grains with baked chicken and frozen vegetables) - reviewed label reading and provided handout. Donalds brother lives next door and he reports that his brother will help him go food shopping and cook; his brother was with him during his nutrition consultation today as well. Abdulai had questions about eggs; discussed how eggs can be a part of a heart healthy diet, but encouaged him to balance this meal with whole wheat toast and/or some fruit like berries. Discussed heart healthy eating, sodium, and T2DM friendly eating. Cay Schillings to practice MyPlate and see what he can add to meals such as fiber rich foods to meals.      Intervention Plan   Intervention Prescribe, educate and counsel regarding individualized specific dietary modifications aiming towards targeted core components such as weight, hypertension, lipid management, diabetes, heart failure and other comorbidities.;Nutrition handout(s) given to patient.    Expected Outcomes Short Term Goal: Understand basic principles of dietary content, such as calories, fat, sodium, cholesterol and nutrients.;Short Term Goal: A plan has been developed with personal nutrition goals set during dietitian appointment.;Long Term Goal: Adherence to prescribed nutrition plan.              Nutrition Assessments:  MEDIFICTS Score Key: ?70 Need to make dietary changes  40-70 Heart Healthy Diet ? 40 Therapeutic Level Cholesterol Diet  Flowsheet Row Cardiac Rehab from 03/11/2022 in [facility] Cardiac and Pulmonary Rehab  Picture Your Plate Total Score on Admission 41      Picture Your Plate Scores: <77 Unhealthy dietary pattern with much room for improvement. 41-50 Dietary pattern unlikely to meet recommendations for good health and room for improvement. 51-60 More healthful dietary pattern, with some room for improvement.  >60 Healthy dietary pattern, although there may be some specific behaviors that could be improved.    Nutrition Goals Re-Evaluation:   Nutrition Goals Discharge (Final Nutrition Goals Re-Evaluation):   Psychosocial: Target Goals: Acknowledge presence or absence of significant depression and/or stress, maximize coping skills, provide positive support system. Participant is able to verbalize types and ability to use techniques and skills needed for reducing stress and depression.   Education: Stress, Anxiety, and Depression - Group verbal and visual presentation to define topics covered.  Reviews how body is impacted by stress, anxiety, and depression.  Also discusses healthy ways to reduce stress and to treat/manage anxiety and depression.  Written material given at graduation. Flowsheet Row Cardiac Rehab from 03/11/2022 in [facility] Cardiac and Pulmonary  Rehab  Education need identified 03/11/22       Education: Sleep Hygiene -Provides group verbal and written instruction about how sleep can affect your health.  Define sleep hygiene, discuss sleep cycles and impact of sleep habits. Review good sleep hygiene tips.    Initial Review & Psychosocial Screening:  Initial Psych Review & Screening - 02/28/22 0958       Initial Review   Current issues with None Identified      Family Dynamics   Good Support System? Yes    Comments Josiyah has a good  family support system, his brother love next door to him. His brother stays the night at his house at times. He has a postive outlook on his health.      Barriers   Psychosocial barriers to participate in program The patient should benefit from training in stress management and relaxation.;There are no identifiable barriers or psychosocial needs.      Screening Interventions   Interventions Encouraged to exercise;To provide support and resources with identified psychosocial needs;Provide feedback about the scores to participant;Program counselor consult    Expected Outcomes Short Term goal: Utilizing psychosocial counselor, staff and physician to assist with identification of specific Stressors or current issues interfering with healing process. Setting desired goal for each stressor or current issue identified.;Long Term Goal: Stressors or current issues are controlled or eliminated.;Short Term goal: Identification and review with participant of any Quality of Life or Depression concerns found by scoring the questionnaire.;Long Term goal: The participant improves quality of Life and PHQ9 Scores as seen by post scores and/or verbalization of changes             Quality of Life Scores:   Quality of Life - 03/11/22 1227       Quality of Life   Select Quality of Life      Quality of Life Scores   Health/Function Pre 19.23 %    Socioeconomic Pre 25 %    Psych/Spiritual Pre 25.5 %    Family Pre 26 %    GLOBAL Pre 22.5 %            Scores of 19 and below usually indicate a poorer quality of life in these areas.  A difference of  2-3 points is a clinically meaningful difference.  A difference of 2-3 points in the total score of the Quality of Life Index has been associated with significant improvement in overall quality of life, self-image, physical symptoms, and general health in studies assessing change in quality of life.  PHQ-9: Review Flowsheet       03/11/2022  Depression  screen PHQ 2/9  Decreased Interest 3  Down, Depressed, Hopeless 0  PHQ - 2 Score 3  Altered sleeping 2  Tired, decreased energy 3  Change in appetite 0  Feeling bad or failure about yourself  0  Trouble concentrating 2  Moving slowly or fidgety/restless 0  Suicidal thoughts 0  PHQ-9 Score 10  Difficult doing work/chores Somewhat difficult   Interpretation of Total Score  Total Score Depression Severity:  1-4 = Minimal depression, 5-9 = Mild depression, 10-14 = Moderate depression, 15-19 = Moderately severe depression, 20-27 = Severe depression   Psychosocial Evaluation and Intervention:  Psychosocial Evaluation - 02/28/22 1002       Psychosocial Evaluation & Interventions   Interventions Encouraged to exercise with the program and follow exercise prescription;Relaxation education;Stress management education    Comments Darrell has a good family support system, his brother  love next door to him. His brother stays the night at his house at times. He has a postive outlook on his health.    Expected Outcomes Short: Start HeartTrack to help with mood. Long: Maintain a healthy mental state    Continue Psychosocial Services  Follow up required by staff             Psychosocial Re-Evaluation:   Psychosocial Discharge (Final Psychosocial Re-Evaluation):   Vocational Rehabilitation: Provide vocational rehab assistance to qualifying candidates.   Vocational Rehab Evaluation & Intervention:  Vocational Rehab - 03/11/22 1228       Initial Vocational Rehab Evaluation & Intervention   Assessment shows need for Vocational Rehabilitation No   retired            Education: Education Goals: Education classes will be provided on a variety of topics geared toward better understanding of heart health and risk factor modification. Participant will state understanding/return demonstration of topics presented as noted by education test scores.  Learning Barriers/Preferences:   Learning Barriers/Preferences - 02/28/22 0956       Learning Barriers/Preferences   Learning Barriers Hearing    Learning Preferences None             General Cardiac Education Topics:  AED/CPR: - Group verbal and written instruction with the use of models to demonstrate the basic use of the AED with the basic ABC's of resuscitation.   Anatomy and Cardiac Procedures: - Group verbal and visual presentation and models provide information about basic cardiac anatomy and function. Reviews the testing methods done to diagnose heart disease and the outcomes of the test results. Describes the treatment choices: Medical Management, Angioplasty, or Coronary Bypass Surgery for treating various heart conditions including Myocardial Infarction, Angina, Valve Disease, and Cardiac Arrhythmias.  Written material given at graduation. Flowsheet Row Cardiac Rehab from 03/11/2022 in [place] [facility] Cardiac and Pulmonary Rehab  Education need identified 03/11/22       Medication Safety: - Group verbal and visual instruction to review commonly prescribed medications for heart and lung disease. Reviews the medication, class of the drug, and side effects. Includes the steps to properly store meds and maintain the prescription regimen.  Written material given at graduation.   Intimacy: - Group verbal instruction through game format to discuss how heart and lung disease can affect sexual intimacy. Written material given at graduation..   Know Your Numbers and Heart Failure: - Group verbal and visual instruction to discuss disease risk factors for cardiac and pulmonary disease and treatment options.  Reviews associated critical values for Overweight/Obesity, Hypertension, Cholesterol, and Diabetes.  Discusses basics of heart failure: signs/symptoms and treatments.  Introduces Heart Failure Zone chart for action plan for heart failure.  Written material given at graduation. Flowsheet Row Cardiac Rehab from 03/11/2022  in [facility] Cardiac and Pulmonary Rehab  Education need identified 03/11/22       Infection Prevention: - Provides verbal and written material to individual with discussion of infection control including proper hand washing and proper equipment cleaning during exercise session. Flowsheet Row Cardiac Rehab from 03/11/2022 in [facility] Cardiac and Pulmonary Rehab  Date 02/28/22  Educator [facility]  Instruction Review Code 1- Verbalizes Understanding       Falls Prevention: - Provides verbal and written material to individual with discussion of falls prevention and safety. Flowsheet Row Cardiac Rehab from 03/11/2022 in [facility] Cardiac and Pulmonary Rehab  Date 02/28/22  Educator [facility]  Instruction Review Code 1- Verbalizes Understanding  Other: -Provides group and verbal instruction on various topics (see comments)   Knowledge Questionnaire Score:  Knowledge Questionnaire Score - 03/11/22 1228       Knowledge Questionnaire Score   Pre Score 16/26             Core Components/Risk Factors/Patient Goals at Admission:  Personal Goals and Risk Factors at Admission - 03/11/22 1228       Core Components/Risk Factors/Patient Goals on Admission    Weight Management Yes;Weight Loss    Intervention Weight Management: Develop a combined nutrition and exercise program designed to reach desired caloric intake, while maintaining appropriate intake of nutrient and fiber, sodium and fats, and appropriate energy expenditure required for the weight goal.;Weight Management: Provide education and appropriate resources to help participant work on and attain dietary goals.;Weight Management/Obesity: Establish reasonable short term and long term weight goals.    Admit Weight 227 lb 12.8 oz (103.3 kg)    Goal Weight: Short Term 222 lb (100.7 kg)    Goal Weight: Long Term 215 lb (97.5 kg)    Expected Outcomes Short Term: Continue to assess and modify interventions until short term weight is achieved;Long Term:  Adherence to nutrition and physical activity/exercise program aimed toward attainment of established weight goal;Weight Loss: Understanding of general recommendations for a balanced deficit meal plan, which promotes 1-2 lb weight loss per week and includes a negative energy balance of [phone] kcal/d;Understanding recommendations for meals to include 15-35% energy as protein, 25-35% energy from fat, 35-60% energy from carbohydrates, less than '200mg'$  of dietary cholesterol, 20-35 gm of total fiber daily;Understanding of distribution of calorie intake throughout the day with the consumption of 4-5 meals/snacks    Diabetes Yes    Intervention Provide education about signs/symptoms and action to take for hypo/hyperglycemia.;Provide education about proper nutrition, including hydration, and aerobic/resistive exercise prescription along with prescribed medications to achieve blood glucose in normal ranges: Fasting glucose 65-99 mg/dL    Expected Outcomes Short Term: Participant verbalizes understanding of the signs/symptoms and immediate care of hyper/hypoglycemia, proper foot care and importance of medication, aerobic/resistive exercise and nutrition plan for blood glucose control.;Long Term: Attainment of HbA1C < 7%.    Heart Failure Yes    Intervention Provide a combined exercise and nutrition program that is supplemented with education, support and counseling about heart failure. Directed toward relieving symptoms such as shortness of breath, decreased exercise tolerance, and extremity edema.    Expected Outcomes Improve functional capacity of life;Short term: Attendance in program 2-3 days a week with increased exercise capacity. Reported lower sodium intake. Reported increased fruit and vegetable intake. Reports medication compliance.;Long term: Adoption of self-care skills and reduction of barriers for early signs and symptoms recognition and intervention leading to self-care maintenance.;Short term: Daily  weights obtained and reported for increase. Utilizing diuretic protocols set by physician.    Hypertension Yes    Intervention Provide education on lifestyle modifcations including regular physical activity/exercise, weight management, moderate sodium restriction and increased consumption of fresh fruit, vegetables, and low fat dairy, alcohol moderation, and smoking cessation.;Monitor prescription use compliance.    Expected Outcomes Short Term: Continued assessment and intervention until BP is < 140/79m HG in hypertensive participants. < 130/873mHG in hypertensive participants with diabetes, heart failure or chronic kidney disease.;Long Term: Maintenance of blood pressure at goal levels.    Lipids Yes    Intervention Provide education and support for participant on nutrition & aerobic/resistive exercise along with prescribed medications to achieve LDL '70mg'$ , HDL >'40mg'$ .  Expected Outcomes Short Term: Participant states understanding of desired cholesterol values and is compliant with medications prescribed. Participant is following exercise prescription and nutrition guidelines.;Long Term: Cholesterol controlled with medications as prescribed, with individualized exercise RX and with personalized nutrition plan. Value goals: LDL < '70mg'$ , HDL > 40 mg.             Education:Diabetes - Individual verbal and written instruction to review signs/symptoms of diabetes, desired ranges of glucose level fasting, after meals and with exercise. Acknowledge that pre and post exercise glucose checks will be done for 3 sessions at entry of program. [place] from 03/11/2022 in [facility] Cardiac and Pulmonary Rehab  Date 02/28/22  Educator [facility]  Instruction Review Code 1- Verbalizes Understanding       Core Components/Risk Factors/Patient Goals Review:    Core Components/Risk Factors/Patient Goals at Discharge (Final Review):    ITP Comments:  ITP Comments     [place] Name 02/28/22 1024  03/11/22 1210         ITP Comments Virtual Visit completed. Patient informed on EP and RD appointment and 6 Minute walk test. Patient also informed of patient health questionnaires on My Chart. Patient Verbalizes understanding. Visit diagnosis can be found in [facility] 12/10/2021. Completed 6MWT and gym orientation. Initial ITP created and sent for review to Dr. Emily Filbert, Medical Director.               Comments: Initial ITP

## 2022-03-11 NOTE — Patient Instructions (Addendum)
Patient Instructions  Patient Details  Name: Shane Holloway MRN: [id number] Date of Birth: 17-Nov-1950 Referring Provider:  Jorge Mandril, MD  Below are your personal goals for exercise, nutrition, and risk factors. Our goal is to help you stay on track towards obtaining and maintaining these goals. We will be discussing your progress on these goals with you throughout the program.  Initial Exercise Prescription:  Initial Exercise Prescription - 03/11/22 1200       Date of Initial Exercise RX and Referring Provider   Date 03/11/22    Referring Provider Bernell List MD      Oxygen   Maintain Oxygen Saturation 88% or higher      Treadmill   MPH 1.5    Grade 0    Minutes 15    METs 2.15      NuStep   Level 2    SPM 80    Minutes 15    METs 2      Track   Laps 22    Minutes 15    METs 2.2      Prescription Details   Frequency (times per week) 3    Duration Progress to 30 minutes of continuous aerobic without signs/symptoms of physical distress      Intensity   THRR 40-80% of Max Heartrate 107-134    Ratings of Perceived Exertion 11-13    Perceived Dyspnea 0-4      Progression   Progression Continue to progress workloads to maintain intensity without signs/symptoms of physical distress.      Resistance Training   Training Prescription Yes    Weight 4 lb    Reps 10-15             Exercise Goals: Frequency: Be able to perform aerobic exercise two to three times per week in program working toward 2-5 days per week of home exercise.  Intensity: Work with a perceived exertion of 11 (fairly light) - 15 (hard) while following your exercise prescription.  We will make changes to your prescription with you as you progress through the program.   Duration: Be able to do 30 to 45 minutes of continuous aerobic exercise in addition to a 5 minute warm-up and a 5 minute cool-down routine.   Nutrition Goals: Your personal nutrition goals will be established when  you do your nutrition analysis with the dietician.  The following are general nutrition guidelines to follow: Cholesterol < '200mg'$ /day Sodium < '1500mg'$ /day Fiber: Men over 50 yrs - 30 grams per day  Personal Goals:  Personal Goals and Risk Factors at Admission - 03/11/22 1228       Core Components/Risk Factors/Patient Goals on Admission    Weight Management Yes;Weight Loss    Intervention Weight Management: Develop a combined nutrition and exercise program designed to reach desired caloric intake, while maintaining appropriate intake of nutrient and fiber, sodium and fats, and appropriate energy expenditure required for the weight goal.;Weight Management: Provide education and appropriate resources to help participant work on and attain dietary goals.;Weight Management/Obesity: Establish reasonable short term and long term weight goals.    Admit Weight 227 lb 12.8 oz (103.3 kg)    Goal Weight: Short Term 222 lb (100.7 kg)    Goal Weight: Long Term 215 lb (97.5 kg)    Expected Outcomes Short Term: Continue to assess and modify interventions until short term weight is achieved;Long Term: Adherence to nutrition and physical activity/exercise program aimed toward attainment of established weight goal;Weight Loss: Understanding  of general recommendations for a balanced deficit meal plan, which promotes 1-2 lb weight loss per week and includes a negative energy balance of [phone] kcal/d;Understanding recommendations for meals to include 15-35% energy as protein, 25-35% energy from fat, 35-60% energy from carbohydrates, less than '200mg'$  of dietary cholesterol, 20-35 gm of total fiber daily;Understanding of distribution of calorie intake throughout the day with the consumption of 4-5 meals/snacks    Diabetes Yes    Intervention Provide education about signs/symptoms and action to take for hypo/hyperglycemia.;Provide education about proper nutrition, including hydration, and aerobic/resistive exercise  prescription along with prescribed medications to achieve blood glucose in normal ranges: Fasting glucose 65-99 mg/dL    Expected Outcomes Short Term: Participant verbalizes understanding of the signs/symptoms and immediate care of hyper/hypoglycemia, proper foot care and importance of medication, aerobic/resistive exercise and nutrition plan for blood glucose control.;Long Term: Attainment of HbA1C < 7%.    Heart Failure Yes    Intervention Provide a combined exercise and nutrition program that is supplemented with education, support and counseling about heart failure. Directed toward relieving symptoms such as shortness of breath, decreased exercise tolerance, and extremity edema.    Expected Outcomes Improve functional capacity of life;Short term: Attendance in program 2-3 days a week with increased exercise capacity. Reported lower sodium intake. Reported increased fruit and vegetable intake. Reports medication compliance.;Long term: Adoption of self-care skills and reduction of barriers for early signs and symptoms recognition and intervention leading to self-care maintenance.;Short term: Daily weights obtained and reported for increase. Utilizing diuretic protocols set by physician.    Hypertension Yes    Intervention Provide education on lifestyle modifcations including regular physical activity/exercise, weight management, moderate sodium restriction and increased consumption of fresh fruit, vegetables, and low fat dairy, alcohol moderation, and smoking cessation.;Monitor prescription use compliance.    Expected Outcomes Short Term: Continued assessment and intervention until BP is < 140/28m HG in hypertensive participants. < 130/843mHG in hypertensive participants with diabetes, heart failure or chronic kidney disease.;Long Term: Maintenance of blood pressure at goal levels.    Lipids Yes    Intervention Provide education and support for participant on nutrition & aerobic/resistive exercise along  with prescribed medications to achieve LDL '70mg'$ , HDL >'40mg'$ .    Expected Outcomes Short Term: Participant states understanding of desired cholesterol values and is compliant with medications prescribed. Participant is following exercise prescription and nutrition guidelines.;Long Term: Cholesterol controlled with medications as prescribed, with individualized exercise RX and with personalized nutrition plan. Value goals: LDL < '70mg'$ , HDL > 40 mg.             Tobacco Use Initial Evaluation: Social History   Tobacco Use  Smoking Status Former   Packs/day: 0.25   Years: 4.00   Total pack years: 1.00   Types: Cigarettes   Quit date: 1995   Years since quitting: 28.7  Smokeless Tobacco Former   Types: Chew   Quit date: 1995    Exercise Goals and Review:  Exercise Goals     Row Name 03/11/22 1225             Exercise Goals   Increase Physical Activity Yes       Intervention Develop an individualized exercise prescription for aerobic and resistive training based on initial evaluation findings, risk stratification, comorbidities and participant's personal goals.;Provide advice, education, support and counseling about physical activity/exercise needs.       Expected Outcomes Short Term: Attend rehab on a regular basis to increase amount of physical  activity.;Long Term: Add in home exercise to make exercise part of routine and to increase amount of physical activity.;Long Term: Exercising regularly at least 3-5 days a week.       Increase Strength and Stamina Yes       Intervention Provide advice, education, support and counseling about physical activity/exercise needs.;Develop an individualized exercise prescription for aerobic and resistive training based on initial evaluation findings, risk stratification, comorbidities and participant's personal goals.       Expected Outcomes Short Term: Increase workloads from initial exercise prescription for resistance, speed, and METs.;Short  Term: Perform resistance training exercises routinely during rehab and add in resistance training at home;Long Term: Improve cardiorespiratory fitness, muscular endurance and strength as measured by increased METs and functional capacity (6MWT)       Able to understand and use rate of perceived exertion (RPE) scale Yes       Intervention Provide education and explanation on how to use RPE scale       Expected Outcomes Short Term: Able to use RPE daily in rehab to express subjective intensity level;Long Term:  Able to use RPE to guide intensity level when exercising independently       Able to understand and use Dyspnea scale Yes       Intervention Provide education and explanation on how to use Dyspnea scale       Expected Outcomes Short Term: Able to use Dyspnea scale daily in rehab to express subjective sense of shortness of breath during exertion;Long Term: Able to use Dyspnea scale to guide intensity level when exercising independently       Knowledge and understanding of Target Heart Rate Range (THRR) Yes       Intervention Provide education and explanation of THRR including how the numbers were predicted and where they are located for reference       Expected Outcomes Short Term: Able to state/look up THRR;Short Term: Able to use daily as guideline for intensity in rehab;Long Term: Able to use THRR to govern intensity when exercising independently       Able to check pulse independently Yes       Intervention Provide education and demonstration on how to check pulse in carotid and radial arteries.;Review the importance of being able to check your own pulse for safety during independent exercise       Expected Outcomes Short Term: Able to explain why pulse checking is important during independent exercise;Long Term: Able to check pulse independently and accurately       Understanding of Exercise Prescription Yes       Intervention Provide education, explanation, and written materials on patient's  individual exercise prescription       Expected Outcomes Short Term: Able to explain program exercise prescription;Long Term: Able to explain home exercise prescription to exercise independently                Copy of goals given to participant.

## 2022-03-13 ENCOUNTER — Encounter: Payer: Self-pay | Admitting: *Deleted

## 2022-03-13 ENCOUNTER — Encounter: Payer: Medicare Other | Admitting: *Deleted

## 2022-03-13 DIAGNOSIS — I214 Non-ST elevation (NSTEMI) myocardial infarction: Secondary | ICD-10-CM

## 2022-03-13 DIAGNOSIS — I252 Old myocardial infarction: Secondary | ICD-10-CM | POA: Diagnosis not present

## 2022-03-13 LAB — GLUCOSE, CAPILLARY
Glucose-Capillary: 183 mg/dL — ABNORMAL HIGH (ref 70–99)
Glucose-Capillary: 182 mg/dL — ABNORMAL HIGH (ref 70–99)

## 2022-03-13 NOTE — Progress Notes (Signed)
Daily Session Note  Patient Details  Name: Shane Holloway MRN: [id number] Date of Birth: 1950/06/30 Referring Provider:   Flowsheet Row Cardiac Rehab from 03/11/2022 in [facility] Cardiac and Pulmonary Rehab  Referring Provider Bernell List MD       Encounter Date: 03/13/2022  Check In:  Session Check In - 03/13/22 0824       Check-In   Supervising physician immediately available to respond to emergencies See telemetry face sheet for immediately available ER MD    Location [facility]-Cardiac & Pulmonary Rehab    Staff Present Antionette Fairy, BS, Exercise Physiologist;Joseph Rosebud Poles, RN, [place]    Virtual Visit No    Medication changes reported     No    Fall or balance concerns reported    No    Warm-up and Cool-down Performed on first and last piece of equipment    Resistance Training Performed Yes    VAD Patient? No    PAD/SET Patient? No      Pain Assessment   Currently in Pain? No/denies                Social History   Tobacco Use  Smoking Status Former   Packs/day: 0.25   Years: 4.00   Total pack years: 1.00   Types: Cigarettes   Quit date: 1995   Years since quitting: 28.7  Smokeless Tobacco Former   Types: Chew   Quit date: 1995    Goals Met:  Independence with exercise equipment Exercise tolerated well No report of concerns or symptoms today Strength training completed today  Goals Unmet:  Not Applicable  Comments: First full day of exercise!  Patient was oriented to gym and equipment including functions, settings, policies, and procedures.  Patient's individual exercise prescription and treatment plan were reviewed.  All starting workloads were established based on the results of the 6 minute walk test done at initial orientation visit.  The plan for exercise progression was also introduced and progression will be customized based on patient's performance and goals.    Dr. Emily Filbert is Medical Director for [place].  Dr. Ottie Glazier is Medical Director for [facility] Pulmonary Rehabilitation.

## 2022-03-13 NOTE — Progress Notes (Signed)
Cardiac Individual Treatment Plan  Patient Details  Name: CRAWFORD TAMURA MRN: [id number] Date of Birth: 06-12-1950 Referring Provider:   Flowsheet Row Cardiac Rehab from 03/11/2022 in [facility] Cardiac and Pulmonary Rehab  Referring Provider Bernell List MD       Initial Encounter Date:  Flowsheet Row Cardiac Rehab from 03/11/2022 in [facility] Cardiac and Pulmonary Rehab  Date 03/11/22       Visit Diagnosis: NSTEMI (non-ST elevated myocardial infarction) [facility])  Patient's Home Medications on Admission:  Current Outpatient Medications:    amiodarone (PACERONE) 200 MG tablet, Take 1 tablet (200 mg total) by mouth daily., Disp: 30 tablet, Rfl: 0   amiodarone (PACERONE) 200 MG tablet, Take 1 tablet by mouth daily., Disp: , Rfl:    amLODipine (NORVASC) 2.5 MG tablet, Take 2.5 mg by mouth daily. (Patient not taking: Reported on 02/28/2022), Disp: , Rfl:    amLODipine (NORVASC) 2.5 MG tablet, Take 1 tablet by mouth daily., Disp: , Rfl:    apixaban (ELIQUIS) 5 MG TABS tablet, Take by mouth., Disp: , Rfl:    aspirin EC 81 MG tablet, Take 1 tablet by mouth daily., Disp: , Rfl:    atorvastatin (LIPITOR) 80 MG tablet, Take 80 mg by mouth daily., Disp: , Rfl:    benzonatate (TESSALON PERLES) 100 MG capsule, Take 1 capsule (100 mg total) by mouth 3 (three) times daily as needed for cough. (Patient not taking: Reported on 02/28/2022), Disp: 20 capsule, Rfl: 0   Cholecalciferol 125 MCG (5000 UT) TABS, Take 1 tablet by mouth daily., Disp: , Rfl:    cyclobenzaprine (FLEXERIL) 5 MG tablet, Take 5 mg by mouth 2 (two) times daily. (Patient not taking: Reported on 02/28/2022), Disp: , Rfl:    ELIQUIS 5 MG TABS tablet, Take 5 mg by mouth 2 (two) times daily., Disp: , Rfl:    empagliflozin (JARDIANCE) 10 MG TABS tablet, Take 1 tablet by mouth daily., Disp: , Rfl:    finasteride (PROSCAR) 5 MG tablet, Take 5 mg by mouth daily. (Patient not taking: Reported on 02/28/2022), Disp: , Rfl:    furosemide (LASIX) 20 MG  tablet, Take 1 tablet (20 mg total) by mouth daily., Disp: 30 tablet, Rfl: 3   glipiZIDE (GLUCOTROL XL) 10 MG 24 hr tablet, Take 10 mg by mouth daily., Disp: , Rfl:    glipiZIDE (GLUCOTROL XL) 10 MG 24 hr tablet, Take 1 tablet by mouth daily. (Patient not taking: Reported on 02/28/2022), Disp: , Rfl:    JARDIANCE 10 MG TABS tablet, Take 10 mg by mouth daily., Disp: , Rfl:    losartan (COZAAR) 100 MG tablet, Take 100 mg by mouth daily. (Patient not taking: Reported on 02/28/2022), Disp: , Rfl:    losartan (COZAAR) 100 MG tablet, Take 1 tablet by mouth daily., Disp: , Rfl:    metoprolol tartrate (LOPRESSOR) 50 MG tablet, Take 1 tablet (50 mg total) by mouth 2 (two) times daily. (Patient not taking: Reported on 02/28/2022), Disp: 60 tablet, Rfl: 0   metoprolol tartrate (LOPRESSOR) 50 MG tablet, Take by mouth., Disp: , Rfl:    nitroGLYCERIN (NITROSTAT) 0.4 MG SL tablet, Place under the tongue., Disp: , Rfl:    tamsulosin (FLOMAX) 0.4 MG CAPS capsule, Take by mouth., Disp: , Rfl:    Tamsulosin HCl (FLOMAX) 0.4 MG CAPS, TAKE ONE CAPSULE BY MOUTH EVERY DAY (Patient not taking: Reported on 02/28/2022), Disp: 30 capsule, Rfl: 2  Past Medical History: Past Medical History:  Diagnosis Date   Allergy  Aortic stenosis    Arthritis    hands   BPH (benign prostatic hyperplasia)    CHF (congestive heart failure) (HCC)    CKD (chronic kidney disease)    Diabetes mellitus    Dyspnea    Hyperlipidemia    Hypertension    Myocardial infarction ([place]) 06/10/2002   Stroke ([place]) 09/2020   Some memory deficits   Vertigo    Wears dentures    full upper    Tobacco Use: Social History   Tobacco Use  Smoking Status Former   Packs/day: 0.25   Years: 4.00   Total pack years: 1.00   Types: Cigarettes   Quit date: 1995   Years since quitting: 28.7  Smokeless Tobacco Former   Types: Chew   Quit date: 1995    Labs: Review Flowsheet       Latest Ref Rng & Units 07/27/2007 07/29/2007 08/10/2011 12/11/2021   Labs for ITP Cardiac and Pulmonary Rehab  Cholestrol 0 - 200 mg/dL - - 178  171   LDL (calc) 0 - 99 mg/dL - - 117  UNABLE TO CALCULATE IF TRIGLYCERIDE OVER 400 mg/dL   Direct LDL 0 - 99 mg/dL - - - 58.1   HDL-C >40 mg/dL - - 35  28   Trlycerides <150 mg/dL - - 130  509   Hemoglobin A1c 4.8 - 5.6 % 7.2 (NOTE)   The ADA recommends the following therapeutic goals for glycemic   control related to Hgb A1C measurement:   Goal of Therapy:   < 7.0% Hgb A1C   Action Suggested:  > 8.0% Hgb A1C   Ref:  Diabetes Care, 22, Suppl. 1, 1999  - 7.3  8.9   PH, Arterial - 7.423  7.380  7.363  7.391  7.351  - -  PCO2 arterial - 38.6  37.7  40.2  39.9  49.2  - -  Bicarbonate - 24.8  22.3  22.6  24.4  27.2  - -  TCO2 - 26.0  '23  24  26  29  '$ - -  Acid-base deficit - - 3.0  2.0  1.0  - -  O2 Saturation - 96.9  95.0  91.0  100.0  - -     Exercise Target Goals: Exercise Program Goal: Individual exercise prescription set using results from initial 6 min walk test and THRR while considering  patient's activity barriers and safety.   Exercise Prescription Goal: Initial exercise prescription builds to 30-45 minutes a day of aerobic activity, 2-3 days per week.  Home exercise guidelines will be given to patient during program as part of exercise prescription that the participant will acknowledge.   Education: Aerobic Exercise: - Group verbal and visual presentation on the components of exercise prescription. Introduces F.I.T.T principle from ACSM for exercise prescriptions.  Reviews F.I.T.T. principles of aerobic exercise including progression. Written material given at graduation. Flowsheet Row Cardiac Rehab from 03/11/2022 in [facility] Cardiac and Pulmonary Rehab  Education need identified 03/11/22       Education: Resistance Exercise: - Group verbal and visual presentation on the components of exercise prescription. Introduces F.I.T.T principle from ACSM for exercise prescriptions  Reviews F.I.T.T. principles of  resistance exercise including progression. Written material given at graduation.    Education: Exercise & Equipment Safety: - Individual verbal instruction and demonstration of equipment use and safety with use of the equipment. Flowsheet Row Cardiac Rehab from 03/11/2022 in [facility] Cardiac and Pulmonary Rehab  Date 02/28/22  Educator [facility]  Instruction  Review Code 1- Verbalizes Understanding       Education: Exercise Physiology & General Exercise Guidelines: - Group verbal and written instruction with models to review the exercise physiology of the cardiovascular system and associated critical values. Provides general exercise guidelines with specific guidelines to those with heart or lung disease.    Education: Flexibility, Balance, Mind/Body Relaxation: - Group verbal and visual presentation with interactive activity on the components of exercise prescription. Introduces F.I.T.T principle from ACSM for exercise prescriptions. Reviews F.I.T.T. principles of flexibility and balance exercise training including progression. Also discusses the mind body connection.  Reviews various relaxation techniques to help reduce and manage stress (i.e. Deep breathing, progressive muscle relaxation, and visualization). Balance handout provided to take home. Written material given at graduation.   Activity Barriers & Risk Stratification:  Activity Barriers & Cardiac Risk Stratification - 03/11/22 1222       Activity Barriers & Cardiac Risk Stratification   Activity Barriers Deconditioning;Muscular Weakness;Balance Concerns;History of Falls;Shortness of Breath;Other (comment);Assistive Device    Comments cognitive impairment, Hx CVA    Cardiac Risk Stratification High             6 Minute Walk:  6 Minute Walk     Row Name 03/11/22 1211         6 Minute Walk   Phase Initial     Distance 860 feet     Walk Time 6 minutes     # of Rest Breaks 0     MPH 1.63     METS 1.85     RPE 9      Perceived Dyspnea  1     VO2 Peak 6.47     Symptoms Yes (comment)     Comments SOB, leg pain 5/10     Resting HR 80 bpm     Resting BP 114/62     Resting Oxygen Saturation  96 %     Exercise Oxygen Saturation  during 6 min walk 96 %     Max Ex. HR 95 bpm     Max Ex. BP 126/74     2 Minute Post BP 122/70              Oxygen Initial Assessment:   Oxygen Re-Evaluation:   Oxygen Discharge (Final Oxygen Re-Evaluation):   Initial Exercise Prescription:  Initial Exercise Prescription - 03/11/22 1200       Date of Initial Exercise RX and Referring Provider   Date 03/11/22    Referring Provider Bernell List MD      Oxygen   Maintain Oxygen Saturation 88% or higher      Treadmill   MPH 1.5    Grade 0    Minutes 15    METs 2.15      NuStep   Level 2    SPM 80    Minutes 15    METs 2      Track   Laps 22    Minutes 15    METs 2.2      Prescription Details   Frequency (times per week) 3    Duration Progress to 30 minutes of continuous aerobic without signs/symptoms of physical distress      Intensity   THRR 40-80% of Max Heartrate 107-134    Ratings of Perceived Exertion 11-13    Perceived Dyspnea 0-4      Progression   Progression Continue to progress workloads to maintain intensity without signs/symptoms of physical distress.  Resistance Training   Training Prescription Yes    Weight 4 lb    Reps 10-15             Perform Capillary Blood Glucose checks as needed.  Exercise Prescription Changes:   Exercise Prescription Changes     Row Name 03/11/22 1200             Response to Exercise   Blood Pressure (Admit) 114/62       Blood Pressure (Exercise) 126/74       Blood Pressure (Exit) 122/70       Heart Rate (Admit) 80 bpm       Heart Rate (Exercise) 95 bpm       Heart Rate (Exit) 80 bpm       Oxygen Saturation (Admit) 96 %       Oxygen Saturation (Exercise) 96 %       Rating of Perceived Exertion (Exercise) 9        Perceived Dyspnea (Exercise) 1       Symptoms SOB, leg pain 5/10       Comments walk test results                Exercise Comments:   Exercise Goals and Review:   Exercise Goals     Row Name 03/11/22 1225             Exercise Goals   Increase Physical Activity Yes       Intervention Develop an individualized exercise prescription for aerobic and resistive training based on initial evaluation findings, risk stratification, comorbidities and participant's personal goals.;Provide advice, education, support and counseling about physical activity/exercise needs.       Expected Outcomes Short Term: Attend rehab on a regular basis to increase amount of physical activity.;Long Term: Add in home exercise to make exercise part of routine and to increase amount of physical activity.;Long Term: Exercising regularly at least 3-5 days a week.       Increase Strength and Stamina Yes       Intervention Provide advice, education, support and counseling about physical activity/exercise needs.;Develop an individualized exercise prescription for aerobic and resistive training based on initial evaluation findings, risk stratification, comorbidities and participant's personal goals.       Expected Outcomes Short Term: Increase workloads from initial exercise prescription for resistance, speed, and METs.;Short Term: Perform resistance training exercises routinely during rehab and add in resistance training at home;Long Term: Improve cardiorespiratory fitness, muscular endurance and strength as measured by increased METs and functional capacity (6MWT)       Able to understand and use rate of perceived exertion (RPE) scale Yes       Intervention Provide education and explanation on how to use RPE scale       Expected Outcomes Short Term: Able to use RPE daily in rehab to express subjective intensity level;Long Term:  Able to use RPE to guide intensity level when exercising independently       Able to  understand and use Dyspnea scale Yes       Intervention Provide education and explanation on how to use Dyspnea scale       Expected Outcomes Short Term: Able to use Dyspnea scale daily in rehab to express subjective sense of shortness of breath during exertion;Long Term: Able to use Dyspnea scale to guide intensity level when exercising independently       Knowledge and understanding of Target Heart Rate Range (THRR) Yes  Intervention Provide education and explanation of THRR including how the numbers were predicted and where they are located for reference       Expected Outcomes Short Term: Able to state/look up THRR;Short Term: Able to use daily as guideline for intensity in rehab;Long Term: Able to use THRR to govern intensity when exercising independently       Able to check pulse independently Yes       Intervention Provide education and demonstration on how to check pulse in carotid and radial arteries.;Review the importance of being able to check your own pulse for safety during independent exercise       Expected Outcomes Short Term: Able to explain why pulse checking is important during independent exercise;Long Term: Able to check pulse independently and accurately       Understanding of Exercise Prescription Yes       Intervention Provide education, explanation, and written materials on patient's individual exercise prescription       Expected Outcomes Short Term: Able to explain program exercise prescription;Long Term: Able to explain home exercise prescription to exercise independently                Exercise Goals Re-Evaluation :   Discharge Exercise Prescription (Final Exercise Prescription Changes):  Exercise Prescription Changes - 03/11/22 1200       Response to Exercise   Blood Pressure (Admit) 114/62    Blood Pressure (Exercise) 126/74    Blood Pressure (Exit) 122/70    Heart Rate (Admit) 80 bpm    Heart Rate (Exercise) 95 bpm    Heart Rate (Exit) 80 bpm     Oxygen Saturation (Admit) 96 %    Oxygen Saturation (Exercise) 96 %    Rating of Perceived Exertion (Exercise) 9    Perceived Dyspnea (Exercise) 1    Symptoms SOB, leg pain 5/10    Comments walk test results             Nutrition:  Target Goals: Understanding of nutrition guidelines, daily intake of sodium '1500mg'$ , cholesterol '200mg'$ , calories 30% from fat and 7% or less from saturated fats, daily to have 5 or more servings of fruits and vegetables.  Education: All About Nutrition: -Group instruction provided by verbal, written material, interactive activities, discussions, models, and posters to present general guidelines for heart healthy nutrition including fat, fiber, MyPlate, the role of sodium in heart healthy nutrition, utilization of the nutrition label, and utilization of this knowledge for meal planning. Follow up email sent as well. Written material given at graduation.   Biometrics:  Pre Biometrics - 03/11/22 1225       Pre Biometrics   Height 6' 1.7" (1.872 m)    Weight 227 lb 12.8 oz (103.3 kg)    BMI (Calculated) 29.49    Single Leg Stand 1.21 seconds              Nutrition Therapy Plan and Nutrition Goals:  Nutrition Therapy & Goals - 03/11/22 1148       Nutrition Therapy   Diet Heart healthy, low Na, T2DM friendly    Protein (specify units) 85-90g    Fiber 30 grams    Whole Grain Foods 3 servings    Saturated Fats 17 max. grams    Fruits and Vegetables 8 servings/day    Sodium 1.5 grams      Personal Nutrition Goals   Nutrition Goal ST: include more fiber with meals - fruits/vegetables, whole grains. Limit sodium <1.5g/day LT: Manage A1C <  7, limited saturated fat <17g/day, limit sodium < 1.5g/day, include at least 30g of fiber/day.    Comments 71 y.o. M admitted to cardiac rehab s/p NSTEMI. PMHx includes CAD, CVA (earlier this year), cognitive impairment, HTN, T2DM (A1C 7.8), HLD, HFrEF, SVT, Aortic Stenosis, CKD stg 3. PSHx includes CABG x3  (2009), CABG x2 (2015). Relevant medications includes lipitor, glipizide, vit D3, jardiance. Nathanuel reports that he has had increased swelling for a while now and recently his weight had gone up (he reports this was a sudden weight gain >5 pounds); highly encouraged him to speak to his MD regarding this as this is likely fluid retention especially as he has history with CKD stg 3 and HFrEF. Discussed limiting sodium to '1500mg'$  as this can help with fluid build-up as well; he reports using many canned and pre-prepared food items which can have a lot of sodium and well as how to build easy meals (sandwiches, eggs with toast and berries, microwaved grains with baked chicken and frozen vegetables) - reviewed label reading and provided handout. Donalds brother lives next door and he reports that his brother will help him go food shopping and cook; his brother was with him during his nutrition consultation today as well. Sergio had questions about eggs; discussed how eggs can be a part of a heart healthy diet, but encouaged him to balance this meal with whole wheat toast and/or some fruit like berries. Discussed heart healthy eating, sodium, and T2DM friendly eating. Cay Schillings to practice MyPlate and see what he can add to meals such as fiber rich foods to meals.      Intervention Plan   Intervention Prescribe, educate and counsel regarding individualized specific dietary modifications aiming towards targeted core components such as weight, hypertension, lipid management, diabetes, heart failure and other comorbidities.;Nutrition handout(s) given to patient.    Expected Outcomes Short Term Goal: Understand basic principles of dietary content, such as calories, fat, sodium, cholesterol and nutrients.;Short Term Goal: A plan has been developed with personal nutrition goals set during dietitian appointment.;Long Term Goal: Adherence to prescribed nutrition plan.             Nutrition  Assessments:  MEDIFICTS Score Key: ?70 Need to make dietary changes  40-70 Heart Healthy Diet ? 40 Therapeutic Level Cholesterol Diet  Flowsheet Row Cardiac Rehab [facility]11/20[facility] Cardiac and Pulmonary Rehab  Picture Your Plate Total Score on Admission 41      Picture Your Plate Scores: <93 Unhealthy dietary pattern with much room for improvement. 41-50 Dietary pattern unlikely to meet recommendations for good health and room for improvement. 51-60 More healthful dietary pattern, with some room for improvement.  >60 Healthy dietary pattern, although there may be some specific behaviors that could be improved.    Nutrition Goals Re-Evaluation:   Nutrition Goals Discharge (Final Nutrition Goals Re-Evaluation):   Psychosocial: Target Goals: Acknowledge presence or absence of significant depression and/or stress, maximize coping skills, provide positive support system. Participant is able to verbalize types and ability to use techniques and skills needed for reducing stress and depression.   Education: Stress, Anxiety, and Depression - Group verbal and visual presentation to define topics covered.  Reviews how body is impacted by stress, anxiety, and depression.  Also discusses healthy ways to reduce stress and to treat/manage anxiety and depression.  Written material given at graduation. Flowsheet Row Cardiac Rehab from 03/11/2022 in [facility] Cardiac and Pulmonary Rehab  Education need identified 03/11/22  Education: Sleep Hygiene -Provides group verbal and written instruction about how sleep can affect your health.  Define sleep hygiene, discuss sleep cycles and impact of sleep habits. Review good sleep hygiene tips.    Initial Review & Psychosocial Screening:  Initial Psych Review & Screening - 02/28/22 0958       Initial Review   Current issues with None Identified      Family Dynamics   Good Support System? Yes    Comments Linden has a good family support  system, his brother love next door to him. His brother stays the night at his house at times. He has a postive outlook on his health.      Barriers   Psychosocial barriers to participate in program The patient should benefit from training in stress management and relaxation.;There are no identifiable barriers or psychosocial needs.      Screening Interventions   Interventions Encouraged to exercise;To provide support and resources with identified psychosocial needs;Provide feedback about the scores to participant;Program counselor consult    Expected Outcomes Short Term goal: Utilizing psychosocial counselor, staff and physician to assist with identification of specific Stressors or current issues interfering with healing process. Setting desired goal for each stressor or current issue identified.;Long Term Goal: Stressors or current issues are controlled or eliminated.;Short Term goal: Identification and review with participant of any Quality of Life or Depression concerns found by scoring the questionnaire.;Long Term goal: The participant improves quality of Life and PHQ9 Scores as seen by post scores and/or verbalization of changes             Quality of Life Scores:   Quality of Life - 03/11/22 1227       Quality of Life   Select Quality of Life      Quality of Life Scores   Health/Function Pre 19.23 %    Socioeconomic Pre 25 %    Psych/Spiritual Pre 25.5 %    Family Pre 26 %    GLOBAL Pre 22.5 %            Scores of 19 and below usually indicate a poorer quality of life in these areas.  A difference of  2-3 points is a clinically meaningful difference.  A difference of 2-3 points in the total score of the Quality of Life Index has been associated with significant improvement in overall quality of life, self-image, physical symptoms, and general health in studies assessing change in quality of life.  PHQ-9: Review Flowsheet       03/11/2022  Depression screen PHQ 2/9   Decreased Interest 3  Down, Depressed, Hopeless 0  PHQ - 2 Score 3  Altered sleeping 2  Tired, decreased energy 3  Change in appetite 0  Feeling bad or failure about yourself  0  Trouble concentrating 2  Moving slowly or fidgety/restless 0  Suicidal thoughts 0  PHQ-9 Score 10  Difficult doing work/chores Somewhat difficult   Interpretation of Total Score  Total Score Depression Severity:  1-4 = Minimal depression, 5-9 = Mild depression, 10-14 = Moderate depression, 15-19 = Moderately severe depression, 20-27 = Severe depression   Psychosocial Evaluation and Intervention:  Psychosocial Evaluation - 02/28/22 1002       Psychosocial Evaluation & Interventions   Interventions Encouraged to exercise with the program and follow exercise prescription;Relaxation education;Stress management education    Comments Gerrard has a good family support system, his brother love next door to him. His brother stays the night at his  house at times. He has a postive outlook on his health.    Expected Outcomes Short: Start HeartTrack to help with mood. Long: Maintain a healthy mental state    Continue Psychosocial Services  Follow up required by staff             Psychosocial Re-Evaluation:   Psychosocial Discharge (Final Psychosocial Re-Evaluation):   Vocational Rehabilitation: Provide vocational rehab assistance to qualifying candidates.   Vocational Rehab Evaluation & Intervention:  Vocational Rehab - 03/11/22 1228       Initial Vocational Rehab Evaluation & Intervention   Assessment shows need for Vocational Rehabilitation No   retired            Education: Education Goals: Education classes will be provided on a variety of topics geared toward better understanding of heart health and risk factor modification. Participant will state understanding/return demonstration of topics presented as noted by education test scores.  Learning Barriers/Preferences:  Learning  Barriers/Preferences - 02/28/22 0956       Learning Barriers/Preferences   Learning Barriers Hearing    Learning Preferences None             General Cardiac Education Topics:  AED/CPR: - Group verbal and written instruction with the use of models to demonstrate the basic use of the AED with the basic ABC's of resuscitation.   Anatomy and Cardiac Procedures: - Group verbal and visual presentation and models provide information about basic cardiac anatomy and function. Reviews the testing methods done to diagnose heart disease and the outcomes of the test results. Describes the treatment choices: Medical Management, Angioplasty, or Coronary Bypass Surgery for treating various heart conditions including Myocardial Infarction, Angina, Valve Disease, and Cardiac Arrhythmias.  Written material given at graduation. Flowsheet Row Cardiac Rehab from 03/11/2022 in [facility] Cardiac and Pulmonary Rehab  Education need identified 03/11/22       Medication Safety: - Group verbal and visual instruction to review commonly prescribed medications for heart and lung disease. Reviews the medication, class of the drug, and side effects. Includes the steps to properly store meds and maintain the prescription regimen.  Written material given at graduation.   Intimacy: - Group verbal instruction through game format to discuss how heart and lung disease can affect sexual intimacy. Written material given at graduation..   Know Your Numbers and Heart Failure: - Group verbal and visual instruction to discuss disease risk factors for cardiac and pulmonary disease and treatment options.  Reviews associated critical values for Overweight/Obesity, Hypertension, Cholesterol, and Diabetes.  Discusses basics of heart failure: signs/symptoms and treatments.  Introduces Heart Failure Zone chart for action plan for heart failure.  Written material given at graduation. Flowsheet Row Cardiac Rehab from 03/11/2022 in [facility]  Cardiac and Pulmonary Rehab  Education need identified 03/11/22       Infection Prevention: - Provides verbal and written material to individual with discussion of infection control including proper hand washing and proper equipment cleaning during exercise session. Flowsheet Row Cardiac Rehab from 03/11/2022 in [facility] Cardiac and Pulmonary Rehab  Date 02/28/22  Educator [facility]  Instruction Review Code 1- Verbalizes Understanding       Falls Prevention: - Provides verbal and written material to individual with discussion of falls prevention and safety. Flowsheet Row Cardiac Rehab from 03/11/2022 in [facility] Cardiac and Pulmonary Rehab  Date 02/28/22  Educator [facility]  Instruction Review Code 1- Verbalizes Understanding       Other: -Provides group and verbal instruction on various topics (see comments)  Knowledge Questionnaire Score:  Knowledge Questionnaire Score - 03/11/22 1228       Knowledge Questionnaire Score   Pre Score 16/26             Core Components/Risk Factors/Patient Goals at Admission:  Personal Goals and Risk Factors at Admission - 03/11/22 1228       Core Components/Risk Factors/Patient Goals on Admission    Weight Management Yes;Weight Loss    Intervention Weight Management: Develop a combined nutrition and exercise program designed to reach desired caloric intake, while maintaining appropriate intake of nutrient and fiber, sodium and fats, and appropriate energy expenditure required for the weight goal.;Weight Management: Provide education and appropriate resources to help participant work on and attain dietary goals.;Weight Management/Obesity: Establish reasonable short term and long term weight goals.    Admit Weight 227 lb 12.8 oz (103.3 kg)    Goal Weight: Short Term 222 lb (100.7 kg)    Goal Weight: Long Term 215 lb (97.5 kg)    Expected Outcomes Short Term: Continue to assess and modify interventions until short term weight is achieved;Long Term:  Adherence to nutrition and physical activity/exercise program aimed toward attainment of established weight goal;Weight Loss: Understanding of general recommendations for a balanced deficit meal plan, which promotes 1-2 lb weight loss per week and includes a negative energy balance of [phone] kcal/d;Understanding recommendations for meals to include 15-35% energy as protein, 25-35% energy from fat, 35-60% energy from carbohydrates, less than '200mg'$  of dietary cholesterol, 20-35 gm of total fiber daily;Understanding of distribution of calorie intake throughout the day with the consumption of 4-5 meals/snacks    Diabetes Yes    Intervention Provide education about signs/symptoms and action to take for hypo/hyperglycemia.;Provide education about proper nutrition, including hydration, and aerobic/resistive exercise prescription along with prescribed medications to achieve blood glucose in normal ranges: Fasting glucose 65-99 mg/dL    Expected Outcomes Short Term: Participant verbalizes understanding of the signs/symptoms and immediate care of hyper/hypoglycemia, proper foot care and importance of medication, aerobic/resistive exercise and nutrition plan for blood glucose control.;Long Term: Attainment of HbA1C < 7%.    Heart Failure Yes    Intervention Provide a combined exercise and nutrition program that is supplemented with education, support and counseling about heart failure. Directed toward relieving symptoms such as shortness of breath, decreased exercise tolerance, and extremity edema.    Expected Outcomes Improve functional capacity of life;Short term: Attendance in program 2-3 days a week with increased exercise capacity. Reported lower sodium intake. Reported increased fruit and vegetable intake. Reports medication compliance.;Long term: Adoption of self-care skills and reduction of barriers for early signs and symptoms recognition and intervention leading to self-care maintenance.;Short term: Daily  weights obtained and reported for increase. Utilizing diuretic protocols set by physician.    Hypertension Yes    Intervention Provide education on lifestyle modifcations including regular physical activity/exercise, weight management, moderate sodium restriction and increased consumption of fresh fruit, vegetables, and low fat dairy, alcohol moderation, and smoking cessation.;Monitor prescription use compliance.    Expected Outcomes Short Term: Continued assessment and intervention until BP is < 140/26m HG in hypertensive participants. < 130/814mHG in hypertensive participants with diabetes, heart failure or chronic kidney disease.;Long Term: Maintenance of blood pressure at goal levels.    Lipids Yes    Intervention Provide education and support for participant on nutrition & aerobic/resistive exercise along with prescribed medications to achieve LDL '70mg'$ , HDL >'40mg'$ .    Expected Outcomes Short Term: Participant states understanding of desired cholesterol  values and is compliant with medications prescribed. Participant is following exercise prescription and nutrition guidelines.;Long Term: Cholesterol controlled with medications as prescribed, with individualized exercise RX and with personalized nutrition plan. Value goals: LDL < '70mg'$ , HDL > 40 mg.             Education:Diabetes - Individual verbal and written instruction to review signs/symptoms of diabetes, desired ranges of glucose level fasting, after meals and with exercise. Acknowledge that pre and post exercise glucose checks will be done for 3 sessions at entry of program. [place] from 03/11/2022 in [facility] Cardiac and Pulmonary Rehab  Date 02/28/22  Educator [facility]  Instruction Review Code 1- Verbalizes Understanding       Core Components/Risk Factors/Patient Goals Review:    Core Components/Risk Factors/Patient Goals at Discharge (Final Review):    ITP Comments:  ITP Comments     Row Name 02/28/22 1024  03/11/22 1210 03/13/22 0651       ITP Comments Virtual Visit completed. Patient informed on EP and RD appointment and 6 Minute walk test. Patient also informed of patient health questionnaires on My Chart. Patient Verbalizes understanding. Visit diagnosis can be found in [facility] 12/10/2021. Completed 6MWT and gym orientation. Initial ITP created and sent for review to Dr. Emily Filbert, Medical Director. 30 Day review completed. Medical Director ITP review done, changes made as directed, and signed approval by Medical Director.   New to program              Comments:

## 2022-03-15 ENCOUNTER — Encounter: Payer: Medicare Other | Admitting: *Deleted

## 2022-03-15 DIAGNOSIS — I214 Non-ST elevation (NSTEMI) myocardial infarction: Secondary | ICD-10-CM

## 2022-03-15 DIAGNOSIS — I252 Old myocardial infarction: Secondary | ICD-10-CM | POA: Diagnosis not present

## 2022-03-15 LAB — GLUCOSE, CAPILLARY
Glucose-Capillary: 253 mg/dL — ABNORMAL HIGH (ref 70–99)
Glucose-Capillary: 264 mg/dL — ABNORMAL HIGH (ref 70–99)

## 2022-03-15 NOTE — Progress Notes (Signed)
Daily Session Note  Patient Details  Name: Shane Holloway MRN: [id number] Date of Birth: 01-07-1951 Referring Provider:   Flowsheet Row Cardiac Rehab from 03/11/2022 in [facility] Cardiac and Pulmonary Rehab  Referring Provider Bernell List MD       Encounter Date: 03/15/2022  Check In:  Session Check In - 03/15/22 0838       Check-In   Supervising physician immediately available to respond to emergencies See telemetry face sheet for immediately available ER MD    Location [facility]-Cardiac & Pulmonary Rehab    Staff Present Heath Lark, RN, BSN, CCRP;Joseph Fletcher, RCP,RRT,BSRT;Jessica [place], [place], [place], CCRP, CCET    Virtual Visit No    Medication changes reported     No    Fall or balance concerns reported    No    Warm-up and Cool-down Performed on first and last piece of equipment    Resistance Training Performed Yes    VAD Patient? No    PAD/SET Patient? No      Pain Assessment   Currently in Pain? No/denies                Social History   Tobacco Use  Smoking Status Former   Packs/day: 0.25   Years: 4.00   Total pack years: 1.00   Types: Cigarettes   Quit date: 1995   Years since quitting: 28.7  Smokeless Tobacco Former   Types: Chew   Quit date: 1995    Goals Met:  Independence with exercise equipment Exercise tolerated well No report of concerns or symptoms today  Goals Unmet:  Not Applicable  Comments: Pt able to follow exercise prescription today without complaint.  Will continue to monitor for progression.    Dr. Emily Filbert is Medical Director for [place].  Dr. Ottie Glazier is Medical Director for [facility] Pulmonary Rehabilitation.

## 2022-03-18 ENCOUNTER — Encounter: Payer: Medicare Other | Admitting: *Deleted

## 2022-03-18 DIAGNOSIS — I214 Non-ST elevation (NSTEMI) myocardial infarction: Secondary | ICD-10-CM

## 2022-03-18 DIAGNOSIS — I252 Old myocardial infarction: Secondary | ICD-10-CM | POA: Diagnosis not present

## 2022-03-18 LAB — GLUCOSE, CAPILLARY
Glucose-Capillary: 163 mg/dL — ABNORMAL HIGH (ref 70–99)
Glucose-Capillary: 267 mg/dL — ABNORMAL HIGH (ref 70–99)

## 2022-03-18 NOTE — Progress Notes (Signed)
Daily Session Note  Patient Details  Name: Shane Holloway MRN: [id number] Date of Birth: [birth year] Referring Provider:   Flowsheet Row Cardiac Rehab from 03/11/2022 in [facility] Cardiac and Pulmonary Rehab  Referring Provider Bernell List MD       Encounter Date: 03/18/2022  Check In:  Session Check In - 03/18/22 0801       Check-In   Supervising physician immediately available to respond to emergencies See telemetry face sheet for immediately available ER MD    Staff Present Earlean Shawl, BS, ACSM CEP, Exercise Physiologist;Joseph Rosebud Poles, RN, [place]    Virtual Visit No    Medication changes reported     No    Fall or balance concerns reported    No    Warm-up and Cool-down Performed on first and last piece of equipment    Resistance Training Performed Yes    VAD Patient? No    PAD/SET Patient? No      Pain Assessment   Currently in Pain? No/denies                Social History   Tobacco Use  Smoking Status Former   Packs/day: 0.25   Years: 4.00   Total pack years: 1.00   Types: Cigarettes   Quit date: 1995   Years since quitting: 28.7  Smokeless Tobacco Former   Types: Chew   Quit date: 1995    Goals Met:  Independence with exercise equipment Exercise tolerated well No report of concerns or symptoms today Strength training completed today  Goals Unmet:  Not Applicable  Comments: Pt able to follow exercise prescription today without complaint.  Will continue to monitor for progression.    Dr. Emily Filbert is Medical Director for [place].  Dr. Ottie Glazier is Medical Director for [facility] Pulmonary Rehabilitation.

## 2022-03-20 ENCOUNTER — Encounter: Payer: Medicare Other | Admitting: *Deleted

## 2022-03-20 DIAGNOSIS — I252 Old myocardial infarction: Secondary | ICD-10-CM | POA: Diagnosis not present

## 2022-03-20 DIAGNOSIS — I214 Non-ST elevation (NSTEMI) myocardial infarction: Secondary | ICD-10-CM

## 2022-03-20 LAB — GLUCOSE, CAPILLARY
Glucose-Capillary: 239 mg/dL — ABNORMAL HIGH (ref 70–99)
Glucose-Capillary: 289 mg/dL — ABNORMAL HIGH (ref 70–99)

## 2022-03-20 NOTE — Progress Notes (Signed)
Daily Session Note  Patient Details  Name: Shane Holloway MRN: [id number] Date of Birth: 11-May-1951 Referring Provider:   Flowsheet Row Cardiac Rehab from 03/11/2022 in [facility] Cardiac and Pulmonary Rehab  Referring Provider Bernell List MD       Encounter Date: 03/20/2022  Check In:  Session Check In - 03/20/22 0802       Check-In   Supervising physician immediately available to respond to emergencies See telemetry face sheet for immediately available ER MD    Location [facility]-Cardiac & Pulmonary Rehab    Staff Present Antionette Fairy, BS, Exercise Physiologist;Joseph Rosebud Poles, RN, [place]    Virtual Visit No    Medication changes reported     No    Fall or balance concerns reported    No    Warm-up and Cool-down Performed on first and last piece of equipment    Resistance Training Performed Yes    VAD Patient? No    PAD/SET Patient? No      Pain Assessment   Currently in Pain? No/denies                Social History   Tobacco Use  Smoking Status Former   Packs/day: 0.25   Years: 4.00   Total pack years: 1.00   Types: Cigarettes   Quit date: 1995   Years since quitting: 28.7  Smokeless Tobacco Former   Types: Chew   Quit date: 1995    Goals Met:  Independence with exercise equipment Exercise tolerated well No report of concerns or symptoms today Strength training completed today  Goals Unmet:  Not Applicable  Comments: Pt able to follow exercise prescription today without complaint.  Will continue to monitor for progression.    Dr. Emily Filbert is Medical Director for [place].  Dr. Ottie Glazier is Medical Director for [facility] Pulmonary Rehabilitation.

## 2022-03-22 ENCOUNTER — Encounter: Payer: Medicare Other | Admitting: *Deleted

## 2022-03-22 DIAGNOSIS — I214 Non-ST elevation (NSTEMI) myocardial infarction: Secondary | ICD-10-CM

## 2022-03-22 DIAGNOSIS — Z955 Presence of coronary angioplasty implant and graft: Secondary | ICD-10-CM

## 2022-03-22 DIAGNOSIS — I252 Old myocardial infarction: Secondary | ICD-10-CM | POA: Diagnosis not present

## 2022-03-22 NOTE — Progress Notes (Signed)
Daily Session Note  Patient Details  Name: Shane Holloway MRN: [id number] Date of Birth: Dec 16, 1950 Referring Provider:   Flowsheet Row Cardiac Rehab from 03/11/2022 in [facility] Cardiac and Pulmonary Rehab  Referring Provider Bernell List MD       Encounter Date: 03/22/2022  Check In:  Session Check In - 03/22/22 0806       Check-In   Supervising physician immediately available to respond to emergencies See telemetry face sheet for immediately available ER MD    Location [facility]-Cardiac & Pulmonary Rehab    Staff Present Nyoka Cowden, RN, BSN, Fenton Foy, BS, Exercise Physiologist;Susanne Bice, RN, BSN, CCRP    Virtual Visit No    Medication changes reported     No    Fall or balance concerns reported    No    Tobacco Cessation No Change    Warm-up and Cool-down Performed on first and last piece of equipment    Resistance Training Performed Yes    VAD Patient? No    PAD/SET Patient? No      Pain Assessment   Currently in Pain? No/denies                Social History   Tobacco Use  Smoking Status Former   Packs/day: 0.25   Years: 4.00   Total pack years: 1.00   Types: Cigarettes   Quit date: 1995   Years since quitting: 28.8  Smokeless Tobacco Former   Types: Chew   Quit date: 1995    Goals Met:  Independence with exercise equipment Exercise tolerated well No report of concerns or symptoms today  Goals Unmet:  Not Applicable  Comments: Pt able to follow exercise prescription today without complaint.  Will continue to monitor for progression.    Dr. Emily Filbert is Medical Director for [place].  Dr. Ottie Glazier is Medical Director for [facility] Pulmonary Rehabilitation.

## 2022-03-25 ENCOUNTER — Encounter: Payer: Medicare Other | Admitting: *Deleted

## 2022-03-25 DIAGNOSIS — I214 Non-ST elevation (NSTEMI) myocardial infarction: Secondary | ICD-10-CM

## 2022-03-25 DIAGNOSIS — I252 Old myocardial infarction: Secondary | ICD-10-CM | POA: Diagnosis not present

## 2022-03-25 NOTE — Progress Notes (Signed)
Daily Session Note  Patient Details  Name: Shane Holloway MRN: [id number] Date of Birth: [birth year] Referring Provider:   Flowsheet Row Cardiac Rehab from 03/11/2022 in [facility] Cardiac and Pulmonary Rehab  Referring Provider Bernell List MD       Encounter Date: 03/25/2022  Check In:  Session Check In - 03/25/22 0802       Check-In   Supervising physician immediately available to respond to emergencies See telemetry face sheet for immediately available ER MD    Location [facility]-Cardiac & Pulmonary Rehab    Staff Present Heath Lark, RN, BSN, CCRP;Joseph [place], RCP,RRT,BSRT;Kelly Terminous, [place], ACSM CEP, Exercise Physiologist    Virtual Visit No    Medication changes reported     No    Fall or balance concerns reported    No    Warm-up and Cool-down Performed on first and last piece of equipment    Resistance Training Performed Yes    VAD Patient? No    PAD/SET Patient? No      Pain Assessment   Currently in Pain? No/denies                Social History   Tobacco Use  Smoking Status Former   Packs/day: 0.25   Years: 4.00   Total pack years: 1.00   Types: Cigarettes   Quit date: 1995   Years since quitting: 28.8  Smokeless Tobacco Former   Types: Chew   Quit date: 1995    Goals Met:  Independence with exercise equipment Exercise tolerated well No report of concerns or symptoms today  Goals Unmet:  Not Applicable  Comments: Pt able to follow exercise prescription today without complaint.  Will continue to monitor for progression.    Dr. Emily Filbert is Medical Director for [place].  Dr. Ottie Glazier is Medical Director for [facility] Pulmonary Rehabilitation.

## 2022-03-27 ENCOUNTER — Encounter: Payer: Medicare Other | Admitting: *Deleted

## 2022-03-27 DIAGNOSIS — I214 Non-ST elevation (NSTEMI) myocardial infarction: Secondary | ICD-10-CM

## 2022-03-27 DIAGNOSIS — I252 Old myocardial infarction: Secondary | ICD-10-CM | POA: Diagnosis not present

## 2022-03-27 NOTE — Progress Notes (Signed)
Daily Session Note  Patient Details  Name: Shane Holloway MRN: [id number] Date of Birth: 1950/06/13 Referring Provider:   Flowsheet Row Cardiac Rehab from 03/11/2022 in [facility] Cardiac and Pulmonary Rehab  Referring Provider Bernell List MD       Encounter Date: 03/27/2022  Check In:  Session Check In - 03/27/22 0740       Check-In   Supervising physician immediately available to respond to emergencies See telemetry face sheet for immediately available ER MD    Location [facility]-Cardiac & Pulmonary Rehab    Staff Present Heath Lark, RN, BSN, CCRP;Jessica Randall, MA, RCEP, CCRP, CCET;Joseph [place], [place]    Virtual Visit No    Medication changes reported     No    Fall or balance concerns reported    No    Warm-up and Cool-down Performed on first and last piece of equipment    Resistance Training Performed Yes    VAD Patient? No    PAD/SET Patient? No      Pain Assessment   Currently in Pain? No/denies                Social History   Tobacco Use  Smoking Status Former   Packs/day: 0.25   Years: 4.00   Total pack years: 1.00   Types: Cigarettes   Quit date: 1995   Years since quitting: 28.8  Smokeless Tobacco Former   Types: Chew   Quit date: 1995    Goals Met:  Independence with exercise equipment Exercise tolerated well No report of concerns or symptoms today  Goals Unmet:  Not Applicable  Comments: Pt able to follow exercise prescription today without complaint.  Will continue to monitor for progression.  Reviewed home exercise with pt today.  Pt plans to walk and use brother's treadmill for exercise.  Reviewed THR, pulse, RPE, sign and symptoms, pulse oximetery and when to call 911 or MD.  Also discussed weather considerations and indoor options.  Pt voiced understanding.   Dr. Emily Filbert is Medical Director for [place].  Dr. Ottie Glazier is Medical Director for [facility] Pulmonary Rehabilitation.

## 2022-04-01 ENCOUNTER — Encounter: Payer: Medicare Other | Admitting: *Deleted

## 2022-04-03 ENCOUNTER — Encounter: Payer: Medicare Other | Admitting: *Deleted

## 2022-04-03 DIAGNOSIS — I214 Non-ST elevation (NSTEMI) myocardial infarction: Secondary | ICD-10-CM

## 2022-04-03 DIAGNOSIS — I252 Old myocardial infarction: Secondary | ICD-10-CM | POA: Diagnosis not present

## 2022-04-03 NOTE — Progress Notes (Signed)
Daily Session Note  Patient Details  Name: Shane Holloway MRN: [id number] Date of Birth: 1951-01-20 Referring Provider:   Flowsheet Row Cardiac Rehab from 03/11/2022 in [facility] Cardiac and Pulmonary Rehab  Referring Provider Bernell List MD       Encounter Date: 04/03/2022  Check In:  Session Check In - 04/03/22 0745       Check-In   Supervising physician immediately available to respond to emergencies See telemetry face sheet for immediately available ER MD    Location [facility]-Cardiac & Pulmonary Rehab    Staff Present Antionette Fairy, BS, Exercise Physiologist;Joseph Rosebud Poles, RN, [place]    Virtual Visit No    Medication changes reported     No    Fall or balance concerns reported    No    Warm-up and Cool-down Performed on first and last piece of equipment    Resistance Training Performed Yes    VAD Patient? No    PAD/SET Patient? No      Pain Assessment   Currently in Pain? No/denies                Social History   Tobacco Use  Smoking Status Former   Packs/day: 0.25   Years: 4.00   Total pack years: 1.00   Types: Cigarettes   Quit date: 1995   Years since quitting: 28.8  Smokeless Tobacco Former   Types: Chew   Quit date: 1995    Goals Met:  Independence with exercise equipment Exercise tolerated well No report of concerns or symptoms today Strength training completed today  Goals Unmet:  Not Applicable  Comments: Pt able to follow exercise prescription today without complaint.  Will continue to monitor for progression.    Dr. Emily Filbert is Medical Director for [place].  Dr. Ottie Glazier is Medical Director for [facility] Pulmonary Rehabilitation.

## 2022-04-05 ENCOUNTER — Encounter: Payer: Medicare Other | Admitting: *Deleted

## 2022-04-05 DIAGNOSIS — I252 Old myocardial infarction: Secondary | ICD-10-CM | POA: Diagnosis not present

## 2022-04-05 DIAGNOSIS — I214 Non-ST elevation (NSTEMI) myocardial infarction: Secondary | ICD-10-CM

## 2022-04-05 NOTE — Progress Notes (Signed)
Daily Session Note  Patient Details  Name: Shane Holloway MRN: [id number] Date of Birth: 05/02/1951 Referring Provider:   Flowsheet Row Cardiac Rehab from 03/11/2022 in [facility] Cardiac and Pulmonary Rehab  Referring Provider Bernell List MD       Encounter Date: 04/05/2022  Check In:  Session Check In - 04/05/22 0803       Check-In   Supervising physician immediately available to respond to emergencies See telemetry face sheet for immediately available ER MD    Location [facility]-Cardiac & Pulmonary Rehab    Staff Present Heath Lark, RN, BSN, CCRP;Jessica [place]ing, MA, RCEP, CCRP, CCET;Joseph [place], [place]    Virtual Visit No    Medication changes reported     No    Fall or balance concerns reported    No    Warm-up and Cool-down Performed on first and last piece of equipment    Resistance Training Performed Yes    VAD Patient? No    PAD/SET Patient? No      Pain Assessment   Currently in Pain? No/denies                Social History   Tobacco Use  Smoking Status Former   Packs/day: 0.25   Years: 4.00   Total pack years: 1.00   Types: Cigarettes   Quit date: 1995   Years since quitting: 28.8  Smokeless Tobacco Former   Types: Chew   Quit date: 1995    Goals Met:  Independence with exercise equipment Exercise tolerated well No report of concerns or symptoms today  Goals Unmet:  Not Applicable  Comments: Pt able to follow exercise prescription today without complaint.  Will continue to monitor for progression.    Dr. Emily Filbert is Medical Director for [place].  Dr. Ottie Glazier is Medical Director for [facility] Pulmonary Rehabilitation.

## 2022-04-08 ENCOUNTER — Encounter: Payer: Medicare Other | Admitting: *Deleted

## 2022-04-08 DIAGNOSIS — Z955 Presence of coronary angioplasty implant and graft: Secondary | ICD-10-CM

## 2022-04-08 DIAGNOSIS — I214 Non-ST elevation (NSTEMI) myocardial infarction: Secondary | ICD-10-CM

## 2022-04-08 DIAGNOSIS — I252 Old myocardial infarction: Secondary | ICD-10-CM | POA: Diagnosis not present

## 2022-04-08 NOTE — Progress Notes (Signed)
Daily Session Note  Patient Details  Name: Shane Holloway MRN: [id number] Date of Birth: 12-23-1950 Referring Provider:   Flowsheet Row Cardiac Rehab from 03/11/2022 in [facility] Cardiac and Pulmonary Rehab  Referring Provider Bernell List MD       Encounter Date: 04/08/2022  Check In:  Session Check In - 04/08/22 0829       Check-In   Supervising physician immediately available to respond to emergencies See telemetry face sheet for immediately available ER MD    Location [facility]-Cardiac & Pulmonary Rehab    Staff Present Earlean Shawl, BS, ACSM CEP, Exercise Physiologist;Vincent Streater Tamala Julian, RN, Terie Purser, RCP,RRT,BSRT    Virtual Visit No    Medication changes reported     No    Fall or balance concerns reported    No    Warm-up and Cool-down Performed on first and last piece of equipment    Resistance Training Performed Yes    VAD Patient? No    PAD/SET Patient? No      Pain Assessment   Currently in Pain? No/denies                Social History   Tobacco Use  Smoking Status Former   Packs/day: 0.25   Years: 4.00   Total pack years: 1.00   Types: Cigarettes   Quit date: 1995   Years since quitting: 28.8  Smokeless Tobacco Former   Types: Chew   Quit date: 1995    Goals Met:  Independence with exercise equipment Exercise tolerated well No report of concerns or symptoms today Strength training completed today  Goals Unmet:  Not Applicable  Comments: Pt able to follow exercise prescription today without complaint.  Will continue to monitor for progression.    Dr. Emily Filbert is Medical Director for [place].  Dr. Ottie Glazier is Medical Director for [facility] Pulmonary Rehabilitation.

## 2022-04-10 ENCOUNTER — Encounter: Payer: Self-pay | Admitting: *Deleted

## 2022-04-10 ENCOUNTER — Encounter: Payer: Medicare Other | Attending: Internal Medicine | Admitting: *Deleted

## 2022-04-10 DIAGNOSIS — Z5189 Encounter for other specified aftercare: Secondary | ICD-10-CM | POA: Insufficient documentation

## 2022-04-10 DIAGNOSIS — I214 Non-ST elevation (NSTEMI) myocardial infarction: Secondary | ICD-10-CM

## 2022-04-10 DIAGNOSIS — I252 Old myocardial infarction: Secondary | ICD-10-CM | POA: Insufficient documentation

## 2022-04-10 NOTE — Progress Notes (Signed)
Daily Session Note  Patient Details  Name: Shane Holloway MRN: [id number] Date of Birth: 1950/08/30 Referring Provider:   Flowsheet Row Cardiac Rehab from 03/11/2022 in [facility]  Referring Provider Bernell List MD       Encounter Date: 04/10/2022  Check In:  Session Check In - 04/10/22 0754       Check-In   Supervising physician immediately available to respond to emergencies See telemetry face sheet for immediately available ER MD    Location [facility]-Cardiac & Pulmonary Rehab    Staff Present Antionette Fairy, BS, Exercise Physiologist;Joseph Rosebud Poles, RN, [place]    Virtual Visit No    Medication changes reported     No    Fall or balance concerns reported    No    Warm-up and Cool-down Performed on first and last piece of equipment    Resistance Training Performed Yes    VAD Patient? No    PAD/SET Patient? No      Pain Assessment   Currently in Pain? No/denies                Social History   Tobacco Use  Smoking Status Former   Packs/day: 0.25   Years: 4.00   Total pack years: 1.00   Types: Cigarettes   Quit date: 1995   Years since quitting: 28.8  Smokeless Tobacco Former   Types: Chew   Quit date: 1995    Goals Met:  Independence with exercise equipment Exercise tolerated well No report of concerns or symptoms today Strength training completed today  Goals Unmet:  Not Applicable  Comments: Pt able to follow exercise prescription today without complaint.  Will continue to monitor for progression.    Dr. Emily Filbert is Medical Director for [facility].  Dr. Ottie Glazier is Medical Director for [facility] Pulmonary Rehabilitation.

## 2022-04-10 NOTE — Progress Notes (Signed)
Cardiac Individual Treatment Plan  Patient Details  Name: Shane Holloway Date of Birth: 06-12-1950 Referring Provider:   Flowsheet Row Cardiac Rehab from 03/11/2022 in [facility] Cardiac and Pulmonary Rehab  Referring Provider Bernell List MD       Initial Encounter Date:  Flowsheet Row Cardiac Rehab from 03/11/2022 in [facility] Cardiac and Pulmonary Rehab  Date 03/11/22       Visit Diagnosis: NSTEMI (non-ST elevated myocardial infarction) [facility])  Patient's Home Medications on Admission:  Current Outpatient Medications:    amiodarone (PACERONE) 200 MG tablet, Take 1 tablet (200 mg total) by mouth daily., Disp: 30 tablet, Rfl: 0   amiodarone (PACERONE) 200 MG tablet, Take 1 tablet by mouth daily., Disp: , Rfl:    amLODipine (NORVASC) 2.5 MG tablet, Take 2.5 mg by mouth daily. (Patient not taking: Reported on 02/28/2022), Disp: , Rfl:    amLODipine (NORVASC) 2.5 MG tablet, Take 1 tablet by mouth daily., Disp: , Rfl:    apixaban (ELIQUIS) 5 MG TABS tablet, Take by mouth., Disp: , Rfl:    aspirin EC 81 MG tablet, Take 1 tablet by mouth daily., Disp: , Rfl:    atorvastatin (LIPITOR) 80 MG tablet, Take 80 mg by mouth daily., Disp: , Rfl:    benzonatate (TESSALON PERLES) 100 MG capsule, Take 1 capsule (100 mg total) by mouth 3 (three) times daily as needed for cough. (Patient not taking: Reported on 02/28/2022), Disp: 20 capsule, Rfl: 0   Cholecalciferol 125 MCG (5000 UT) TABS, Take 1 tablet by mouth daily., Disp: , Rfl:    cyclobenzaprine (FLEXERIL) 5 MG tablet, Take 5 mg by mouth 2 (two) times daily. (Patient not taking: Reported on 02/28/2022), Disp: , Rfl:    ELIQUIS 5 MG TABS tablet, Take 5 mg by mouth 2 (two) times daily., Disp: , Rfl:    empagliflozin (JARDIANCE) 10 MG TABS tablet, Take 1 tablet by mouth daily., Disp: , Rfl:    finasteride (PROSCAR) 5 MG tablet, Take 5 mg by mouth daily. (Patient not taking: Reported on 02/28/2022), Disp: , Rfl:    furosemide (LASIX) 20 MG  tablet, Take 1 tablet (20 mg total) by mouth daily., Disp: 30 tablet, Rfl: 3   glipiZIDE (GLUCOTROL XL) 10 MG 24 hr tablet, Take 10 mg by mouth daily., Disp: , Rfl:    glipiZIDE (GLUCOTROL XL) 10 MG 24 hr tablet, Take 1 tablet by mouth daily. (Patient not taking: Reported on 02/28/2022), Disp: , Rfl:    JARDIANCE 10 MG TABS tablet, Take 10 mg by mouth daily., Disp: , Rfl:    losartan (COZAAR) 100 MG tablet, Take 100 mg by mouth daily. (Patient not taking: Reported on 02/28/2022), Disp: , Rfl:    losartan (COZAAR) 100 MG tablet, Take 1 tablet by mouth daily., Disp: , Rfl:    metoprolol tartrate (LOPRESSOR) 50 MG tablet, Take 1 tablet (50 mg total) by mouth 2 (two) times daily. (Patient not taking: Reported on 02/28/2022), Disp: 60 tablet, Rfl: 0   metoprolol tartrate (LOPRESSOR) 50 MG tablet, Take by mouth., Disp: , Rfl:    nitroGLYCERIN (NITROSTAT) 0.4 MG SL tablet, Place under the tongue., Disp: , Rfl:    tamsulosin (FLOMAX) 0.4 MG CAPS capsule, Take by mouth., Disp: , Rfl:    Tamsulosin HCl (FLOMAX) 0.4 MG CAPS, TAKE ONE CAPSULE BY MOUTH EVERY DAY (Patient not taking: Reported on 02/28/2022), Disp: 30 capsule, Rfl: 2  Past Medical History: Past Medical History:  Diagnosis Date   Allergy  Aortic stenosis    Arthritis    hands   BPH (benign prostatic hyperplasia)    CHF (congestive heart failure) (HCC)    CKD (chronic kidney disease)    Diabetes mellitus    Dyspnea    Hyperlipidemia    Hypertension    Myocardial infarction ([place]) 06/10/2002   Stroke ([place]) 09/2020   Some memory deficits   Vertigo    Wears dentures    full upper    Tobacco Use: Social History   Tobacco Use  Smoking Status Former   Packs/day: 0.25   Years: 4.00   Total pack years: 1.00   Types: Cigarettes   Quit date: 1995   Years since quitting: 28.8  Smokeless Tobacco Former   Types: Chew   Quit date: 1995    Labs: Review Flowsheet       Latest Ref Rng & Units 07/27/2007 07/29/2007 08/10/2011 12/11/2021   Labs for ITP Cardiac and Pulmonary Rehab  Cholestrol 0 - 200 mg/dL - - 178  171   LDL (calc) 0 - 99 mg/dL - - 117  UNABLE TO CALCULATE IF TRIGLYCERIDE OVER 400 mg/dL   Direct LDL 0 - 99 mg/dL - - - 58.1   HDL-C >40 mg/dL - - 35  28   Trlycerides <150 mg/dL - - 130  509   Hemoglobin A1c 4.8 - 5.6 % 7.2 (NOTE)   The ADA recommends the following therapeutic goals for glycemic   control related to Hgb A1C measurement:   Goal of Therapy:   < 7.0% Hgb A1C   Action Suggested:  > 8.0% Hgb A1C   Ref:  Diabetes Care, 22, Suppl. 1, 1999  - 7.3  8.9   PH, Arterial - 7.423  7.380  7.363  7.391  7.351  - -  PCO2 arterial - 38.6  37.7  40.2  39.9  49.2  - -  Bicarbonate - 24.8  22.3  22.6  24.4  27.2  - -  TCO2 - 26.0  _0 - -  Acid-base deficit - - 3.0  2.0  1.0  - -  O2 Saturation - 96.9  95.0  91.0  100.0  - -     Exercise Target Goals: Exercise Program Goal: Individual exercise prescription set using results from initial 6 min walk test and THRR while considering  patient's activity barriers and safety.   Exercise Prescription Goal: Initial exercise prescription builds to 30-45 minutes a day of aerobic activity, 2-3 days per week.  Home exercise guidelines will be given to patient during program as part of exercise prescription that the participant will acknowledge.   Education: Aerobic Exercise: - Group verbal and visual presentation on the components of exercise prescription. Introduces F.I.T.T principle from ACSM for exercise prescriptions.  Reviews F.I.T.T. principles of aerobic exercise including progression. Written material given at graduation. Flowsheet Row Cardiac Rehab from 04/10/2022 in [facility] Cardiac and Pulmonary Rehab  Education need identified 03/11/22       Education: Resistance Exercise: - Group verbal and visual presentation on the components of exercise prescription. Introduces F.I.T.T principle from ACSM for exercise prescriptions  Reviews F.I.T.T. principles of  resistance exercise including progression. Written material given at graduation.    Education: Exercise & Equipment Safety: - Individual verbal instruction and demonstration of equipment use and safety with use of the equipment. Flowsheet Row Cardiac Rehab from 04/10/2022 in [facility] Cardiac and Pulmonary Rehab  Date 02/28/22  Educator [facility]  Instruction  Review Code 1- Verbalizes Understanding       Education: Exercise Physiology & General Exercise Guidelines: - Group verbal and written instruction with models to review the exercise physiology of the cardiovascular system and associated critical values. Provides general exercise guidelines with specific guidelines to those with heart or lung disease.    Education: Flexibility, Balance, Mind/Body Relaxation: - Group verbal and visual presentation with interactive activity on the components of exercise prescription. Introduces F.I.T.T principle from ACSM for exercise prescriptions. Reviews F.I.T.T. principles of flexibility and balance exercise training including progression. Also discusses the mind body connection.  Reviews various relaxation techniques to help reduce and manage stress (i.e. Deep breathing, progressive muscle relaxation, and visualization). Balance handout provided to take home. Written material given at graduation.   Activity Barriers & Risk Stratification:  Activity Barriers & Cardiac Risk Stratification - 03/11/22 1222       Activity Barriers & Cardiac Risk Stratification   Activity Barriers Deconditioning;Muscular Weakness;Balance Concerns;History of Falls;Shortness of Breath;Other (comment);Assistive Device    Comments cognitive impairment, Hx CVA    Cardiac Risk Stratification High             6 Minute Walk:  6 Minute Walk     Row Name 03/11/22 1211         6 Minute Walk   Phase Initial     Distance 860 feet     Walk Time 6 minutes     # of Rest Breaks 0     MPH 1.63     METS 1.85     RPE 9      Perceived Dyspnea  1     VO2 Peak 6.47     Symptoms Yes (comment)     Comments SOB, leg pain 5/10     Resting HR 80 bpm     Resting BP 114/62     Resting Oxygen Saturation  96 %     Exercise Oxygen Saturation  during 6 min walk 96 %     Max Ex. HR 95 bpm     Max Ex. BP 126/74     2 Minute Post BP 122/70              Oxygen Initial Assessment:   Oxygen Re-Evaluation:   Oxygen Discharge (Final Oxygen Re-Evaluation):   Initial Exercise Prescription:  Initial Exercise Prescription - 03/11/22 1200       Date of Initial Exercise RX and Referring Provider   Date 03/11/22    Referring Provider Bernell List MD      Oxygen   Maintain Oxygen Saturation 88% or higher      Treadmill   MPH 1.5    Grade 0    Minutes 15    METs 2.15      NuStep   Level 2    SPM 80    Minutes 15    METs 2      Track   Laps 22    Minutes 15    METs 2.2      Prescription Details   Frequency (times per week) 3    Duration Progress to 30 minutes of continuous aerobic without signs/symptoms of physical distress      Intensity   THRR 40-80% of Max Heartrate 107-134    Ratings of Perceived Exertion 11-13    Perceived Dyspnea 0-4      Progression   Progression Continue to progress workloads to maintain intensity without signs/symptoms of physical distress.  Resistance Training   Training Prescription Yes    Weight 4 lb    Reps 10-15             Perform Capillary Blood Glucose checks as needed.  Exercise Prescription Changes:   Exercise Prescription Changes     Row Name 03/11/22 1200 03/27/22 0700 04/08/22 1000         Response to Exercise   Blood Pressure (Admit) 114/62 -- 128/76     Blood Pressure (Exercise) 126/74 -- 132/62     Blood Pressure (Exit) 122/70 -- 130/82     Heart Rate (Admit) 80 bpm -- 76 bpm     Heart Rate (Exercise) 95 bpm -- 98 bpm     Heart Rate (Exit) 80 bpm -- 72 bpm     Oxygen Saturation (Admit) 96 % -- --     Oxygen Saturation  (Exercise) 96 % -- --     Rating of Perceived Exertion (Exercise) 9 -- 13     Perceived Dyspnea (Exercise) 1 -- --     Symptoms SOB, leg pain 5/10 -- none     Comments walk test results -- 3rd full week of exercise     Duration -- -- Progress to 30 minutes of  aerobic without signs/symptoms of physical distress     Intensity -- -- THRR unchanged       Progression   Progression -- -- Continue to progress workloads to maintain intensity without signs/symptoms of physical distress.     Average METs -- -- 1.92       Resistance Training   Training Prescription -- -- Yes     Weight -- -- 4 lb     Reps -- -- 10-15       Interval Training   Interval Training -- -- No       NuStep   Level -- -- 4     Minutes -- -- 15     METs -- -- 2       Biostep-RELP   Level -- -- 2     Minutes -- -- 15     METs -- -- 2       Track   Laps -- -- 24     Minutes -- -- 15     METs -- -- 2.31       Home Exercise Plan   Plans to continue exercise at -- Home (comment)  walking, treadmill Home (comment)  walking, treadmill     Frequency -- Add 2 additional days to program exercise sessions. Add 2 additional days to program exercise sessions.     Initial Home Exercises Provided -- 03/27/22 03/27/22       Oxygen   Maintain Oxygen Saturation -- -- 88% or higher              Exercise Comments:   Exercise Comments     Row Name 03/13/22 0825           Exercise Comments First full day of exercise!  Patient was oriented to gym and equipment including functions, settings, policies, and procedures.  Patient's individual exercise prescription and treatment plan were reviewed.  All starting workloads were established based on the results of the 6 minute walk test done at initial orientation visit.  The plan for exercise progression was also introduced and progression will be customized based on patient's performance and goals.                Exercise  Goals and Review:   Exercise Goals      Row Name 03/11/22 1225             Exercise Goals   Increase Physical Activity Yes       Intervention Develop an individualized exercise prescription for aerobic and resistive training based on initial evaluation findings, risk stratification, comorbidities and participant's personal goals.;Provide advice, education, support and counseling about physical activity/exercise needs.       Expected Outcomes Short Term: Attend rehab on a regular basis to increase amount of physical activity.;Long Term: Add in home exercise to make exercise part of routine and to increase amount of physical activity.;Long Term: Exercising regularly at least 3-5 days a week.       Increase Strength and Stamina Yes       Intervention Provide advice, education, support and counseling about physical activity/exercise needs.;Develop an individualized exercise prescription for aerobic and resistive training based on initial evaluation findings, risk stratification, comorbidities and participant's personal goals.       Expected Outcomes Short Term: Increase workloads from initial exercise prescription for resistance, speed, and METs.;Short Term: Perform resistance training exercises routinely during rehab and add in resistance training at home;Long Term: Improve cardiorespiratory fitness, muscular endurance and strength as measured by increased METs and functional capacity (6MWT)       Able to understand and use rate of perceived exertion (RPE) scale Yes       Intervention Provide education and explanation on how to use RPE scale       Expected Outcomes Short Term: Able to use RPE daily in rehab to express subjective intensity level;Long Term:  Able to use RPE to guide intensity level when exercising independently       Able to understand and use Dyspnea scale Yes       Intervention Provide education and explanation on how to use Dyspnea scale       Expected Outcomes Short Term: Able to use Dyspnea scale daily in rehab to  express subjective sense of shortness of breath during exertion;Long Term: Able to use Dyspnea scale to guide intensity level when exercising independently       Knowledge and understanding of Target Heart Rate Range (THRR) Yes       Intervention Provide education and explanation of THRR including how the numbers were predicted and where they are located for reference       Expected Outcomes Short Term: Able to state/look up THRR;Short Term: Able to use daily as guideline for intensity in rehab;Long Term: Able to use THRR to govern intensity when exercising independently       Able to check pulse independently Yes       Intervention Provide education and demonstration on how to check pulse in carotid and radial arteries.;Review the importance of being able to check your own pulse for safety during independent exercise       Expected Outcomes Short Term: Able to explain why pulse checking is important during independent exercise;Long Term: Able to check pulse independently and accurately       Understanding of Exercise Prescription Yes       Intervention Provide education, explanation, and written materials on patient's individual exercise prescription       Expected Outcomes Short Term: Able to explain program exercise prescription;Long Term: Able to explain home exercise prescription to exercise independently                Exercise Goals Re-Evaluation :  Exercise  Goals Re-Evaluation     Row Name 03/13/22 6962 03/27/22 0756 04/08/22 1049         Exercise Goal Re-Evaluation   Exercise Goals Review Able to understand and use rate of perceived exertion (RPE) scale;Able to understand and use Dyspnea scale;Knowledge and understanding of Target Heart Rate Range (THRR);Understanding of Exercise Prescription Able to understand and use rate of perceived exertion (RPE) scale;Able to understand and use Dyspnea scale;Knowledge and understanding of Target Heart Rate Range (THRR);Understanding of  Exercise Prescription;Increase Strength and Stamina;Increase Physical Activity;Able to check pulse independently Increase Physical Activity;Increase Strength and Stamina;Understanding of Exercise Prescription     Comments Reviewed RPE and dyspnea scales, THR and program prescription with pt today.  Pt voiced understanding and was given a copy of goals to take home. Reviewed home exercise with pt today.  Pt plans to walk and use brother's treadmill for exercise.  Reviewed THR, pulse, RPE, sign and symptoms, pulse oximetery and when to call 911 or MD.  Also discussed weather considerations and indoor options.  Pt voiced understanding.  He is already starting to notice a difference in his stamina. Timmothy Sours is doing well for the first couple weeks he has been here. He has so far been able to walk 24 laps on the track and is now up to level 4 on the T4 Nustep. RPEs are staying in appropriate range. We will continue to monitor.     Expected Outcomes Short: Use RPE daily to regulate intensity. Long: Follow program prescription in THR. Short: Add in exercise on off days Long: continue to exercise indpendently Short: Continue to increase laps on track Long: Continue to increase overall MET level              Discharge Exercise Prescription (Final Exercise Prescription Changes):  Exercise Prescription Changes - 04/08/22 1000       Response to Exercise   Blood Pressure (Admit) 128/76    Blood Pressure (Exercise) 132/62    Blood Pressure (Exit) 130/82    Heart Rate (Admit) 76 bpm    Heart Rate (Exercise) 98 bpm    Heart Rate (Exit) 72 bpm    Rating of Perceived Exertion (Exercise) 13    Symptoms none    Comments 3rd full week of exercise    Duration Progress to 30 minutes of  aerobic without signs/symptoms of physical distress    Intensity THRR unchanged      Progression   Progression Continue to progress workloads to maintain intensity without signs/symptoms of physical distress.    Average METs 1.92       Resistance Training   Training Prescription Yes    Weight 4 lb    Reps 10-15      Interval Training   Interval Training No      NuStep   Level 4    Minutes 15    METs 2      Biostep-RELP   Level 2    Minutes 15    METs 2      Track   Laps 24    Minutes 15    METs 2.31      Home Exercise Plan   Plans to continue exercise at Home (comment)   walking, treadmill   Frequency Add 2 additional days to program exercise sessions.    Initial Home Exercises Provided 03/27/22      Oxygen   Maintain Oxygen Saturation 88% or higher  Nutrition:  Target Goals: Understanding of nutrition guidelines, daily intake of sodium <1574m, cholesterol <2053m calories 30% from fat and 7% or less from saturated fats, daily to have 5 or more servings of fruits and vegetables.  Education: All About Nutrition: -Group instruction provided by verbal, written material, interactive activities, discussions, models, and posters to present general guidelines for heart healthy nutrition including fat, fiber, MyPlate, the role of sodium in heart healthy nutrition, utilization of the nutrition label, and utilization of this knowledge for meal planning. Follow up email sent as well. Written material given at graduation.   Biometrics:  Pre Biometrics - 03/11/22 1225       Pre Biometrics   Height 6' 1.7" (1.872 m)    Weight 227 lb 12.8 oz (103.3 kg)    BMI (Calculated) 29.49    Single Leg Stand 1.21 seconds              Nutrition Therapy Plan and Nutrition Goals:  Nutrition Therapy & Goals - 03/11/22 1148       Nutrition Therapy   Diet Heart healthy, low Na, T2DM friendly    Protein (specify units) 85-90g    Fiber 30 grams    Whole Grain Foods 3 servings    Saturated Fats 17 max. grams    Fruits and Vegetables 8 servings/day    Sodium 1.5 grams      Personal Nutrition Goals   Nutrition Goal ST: include more fiber with meals - fruits/vegetables, whole grains. Limit  sodium <1.5g/day LT: Manage A1C < 7, limited saturated fat <17g/day, limit sodium < 1.5g/day, include at least 30g of fiber/day.    Comments [age].o. M admitted to cardiac rehab s/p NSTEMI. PMHx includes CAD, CVA (earlier this year), cognitive impairment, HTN, T2DM (A1C 7.8), HLD, HFrEF, SVT, Aortic Stenosis, CKD stg 3. PSHx includes CABG x3 (2009), CABG x2 (2015). Relevant medications includes lipitor, glipizide, vit D3, jardiance. DoElihueeports that he has had increased swelling for a while now and recently his weight had gone up (he reports this was a sudden weight gain >5 pounds); highly encouraged him to speak to his MD regarding this as this is likely fluid retention especially as he has history with CKD stg 3 and HFrEF. Discussed limiting sodium to <150096ms this can help with fluid build-up as well; he reports using many canned and pre-prepared food items which can have a lot of sodium and well as how to build easy meals (sandwiches, eggs with toast and berries, microwaved grains with baked chicken and frozen vegetables) - reviewed label reading and provided handout. Donalds brother lives next door and he reports that his brother will help him go food shopping and cook; his brother was with him during his nutrition consultation today as well. DonNabeeld questions about eggs; discussed how eggs can be a part of a heart healthy diet, but encouaged him to balance this meal with whole wheat toast and/or some fruit like berries. Discussed heart healthy eating, sodium, and T2DM friendly eating. EncCay Schillings practice MyPlate and see what he can add to meals such as fiber rich foods to meals.      Intervention Plan   Intervention Prescribe, educate and counsel regarding individualized specific dietary modifications aiming towards targeted core components such as weight, hypertension, lipid management, diabetes, heart failure and other comorbidities.;Nutrition handout(s) given to patient.    Expected  Outcomes Short Term Goal: Understand basic principles of dietary content, such as calories, fat, sodium, cholesterol and nutrients.;Short  Term Goal: A plan has been developed with personal nutrition goals set during dietitian appointment.;Long Term Goal: Adherence to prescribed nutrition plan.             Nutrition Assessments:  MEDIFICTS Score Key: ?70 Need to make dietary changes  40-70 Heart Healthy Diet ? 40 Therapeutic Level Cholesterol Diet  Flowsheet Row Cardiac Rehab from 03/11/2022 in [facility] Cardiac and Pulmonary Rehab  Picture Your Plate Total Score on Admission 41      Picture Your Plate Scores: <11 Unhealthy dietary pattern with much room for improvement. 41-50 Dietary pattern unlikely to meet recommendations for good health and room for improvement. 51-60 More healthful dietary pattern, with some room for improvement.  >60 Healthy dietary pattern, although there may be some specific behaviors that could be improved.    Nutrition Goals Re-Evaluation:  Nutrition Goals Re-Evaluation     [place] Name 03/27/22 0759             Goals   Nutrition Goal ST: include more fiber with meals - fruits/vegetables, whole grains. Limit sodium <1.5g/day LT: Manage A1C < 7, limited saturated fat <17g/day, limit sodium < 1.5g/day, include at least 30g of fiber/day.       Comment Timmothy Sours is doing well in rehab. He is doing well with his diet. He is trying to get in more fiber with more fruits and vegetables.  He has noticed that his stomach has already started to go down.  He is also taking his fluid pill. He is also watching his salt intake as well.       Expected Outcome Short: Conitnue to add variety Long; Continue to improve diet                Nutrition Goals Discharge (Final Nutrition Goals Re-Evaluation):  Nutrition Goals Re-Evaluation - 03/27/22 0759       Goals   Nutrition Goal ST: include more fiber with meals - fruits/vegetables, whole grains. Limit sodium <1.5g/day LT:  Manage A1C < 7, limited saturated fat <17g/day, limit sodium < 1.5g/day, include at least 30g of fiber/day.    Comment Timmothy Sours is doing well in rehab. He is doing well with his diet. He is trying to get in more fiber with more fruits and vegetables.  He has noticed that his stomach has already started to go down.  He is also taking his fluid pill. He is also watching his salt intake as well.    Expected Outcome Short: Conitnue to add variety Long; Continue to improve diet             Psychosocial: Target Goals: Acknowledge presence or absence of significant depression and/or stress, maximize coping skills, provide positive support system. Participant is able to verbalize types and ability to use techniques and skills needed for reducing stress and depression.   Education: Stress, Anxiety, and Depression - Group verbal and visual presentation to define topics covered.  Reviews how body is impacted by stress, anxiety, and depression.  Also discusses healthy ways to reduce stress and to treat/manage anxiety and depression.  Written material given at graduation. Flowsheet Row Cardiac Rehab from 04/10/2022 in [facility] Cardiac and Pulmonary Rehab  Education need identified 03/11/22  Date 04/03/22  Educator [facility]  Instruction Review Code 1- [employer] Understanding       Education: Sleep Hygiene -Provides group verbal and written instruction about how sleep can affect your health.  Define sleep hygiene, discuss sleep cycles and impact of sleep habits. Review good sleep hygiene tips.  Initial Review & Psychosocial Screening:  Initial Psych Review & Screening - 02/28/22 0958       Initial Review   Current issues with None Identified      Family Dynamics   Good Support System? Yes    Comments Noemi has a good family support system, his brother love next door to him. His brother stays the night at his house at times. He has a postive outlook on his health.      Barriers   Psychosocial barriers to  participate in program The patient should benefit from training in stress management and relaxation.;There are no identifiable barriers or psychosocial needs.      Screening Interventions   Interventions Encouraged to exercise;To provide support and resources with identified psychosocial needs;Provide feedback about the scores to participant;Program counselor consult    Expected Outcomes Short Term goal: Utilizing psychosocial counselor, staff and physician to assist with identification of specific Stressors or current issues interfering with healing process. Setting desired goal for each stressor or current issue identified.;Long Term Goal: Stressors or current issues are controlled or eliminated.;Short Term goal: Identification and review with participant of any Quality of Life or Depression concerns found by scoring the questionnaire.;Long Term goal: The participant improves quality of Life and PHQ9 Scores as seen by post scores and/or verbalization of changes             Quality of Life Scores:   Quality of Life - 03/11/22 1227       Quality of Life   Select Quality of Life      Quality of Life Scores   Health/Function Pre 19.23 %    Socioeconomic Pre 25 %    Psych/Spiritual Pre 25.5 %    Family Pre 26 %    GLOBAL Pre 22.5 %            Scores of 19 and below usually indicate a poorer quality of life in these areas.  A difference of  2-3 points is a clinically meaningful difference.  A difference of 2-3 points in the total score of the Quality of Life Index has been associated with significant improvement in overall quality of life, self-image, physical symptoms, and general health in studies assessing change in quality of life.  PHQ-9: Review Flowsheet       03/11/2022  Depression screen PHQ 2/9  Decreased Interest 3  Down, Depressed, Hopeless 0  PHQ - 2 Score 3  Altered sleeping 2  Tired, decreased energy 3  Change in appetite 0  Feeling bad or failure about yourself   0  Trouble concentrating 2  Moving slowly or fidgety/restless 0  Suicidal thoughts 0  PHQ-9 Score 10  Difficult doing work/chores Somewhat difficult   Interpretation of Total Score  Total Score Depression Severity:  1-4 = Minimal depression, 5-9 = Mild depression, 10-14 = Moderate depression, 15-19 = Moderately severe depression, 20-27 = Severe depression   Psychosocial Evaluation and Intervention:  Psychosocial Evaluation - 02/28/22 1002       Psychosocial Evaluation & Interventions   Interventions Encouraged to exercise with the program and follow exercise prescription;Relaxation education;Stress management education    Comments Oreste has a good family support system, his brother love next door to him. His brother stays the night at his house at times. He has a postive outlook on his health.    Expected Outcomes Short: Start [facility] to help with mood. Long: Maintain a healthy mental state    Continue Psychosocial Services  Follow up required by staff             Psychosocial Re-Evaluation:  Psychosocial Re-Evaluation     [place] Name 03/27/22 0757             Psychosocial Re-Evaluation   Current issues with None Identified       Comments Timmothy Sours is doing well in rehab.  He is feeling good mentally and in a good place. He sleeps well.  He lives next to his brother who helps care for him and drives him to appointments. We talked about being more active and walking more too.       Expected Outcomes Short: Conitnue to exercise for mental boost Long; Continue to stay positive       Interventions Encouraged to attend Cardiac Rehabilitation for the exercise;Stress management education       Continue Psychosocial Services  Follow up required by staff                Psychosocial Discharge (Final Psychosocial Re-Evaluation):  Psychosocial Re-Evaluation - 03/27/22 0757       Psychosocial Re-Evaluation   Current issues with None Identified    Comments Timmothy Sours is doing well in  rehab.  He is feeling good mentally and in a good place. He sleeps well.  He lives next to his brother who helps care for him and drives him to appointments. We talked about being more active and walking more too.    Expected Outcomes Short: Conitnue to exercise for mental boost Long; Continue to stay positive    Interventions Encouraged to attend Cardiac Rehabilitation for the exercise;Stress management education    Continue Psychosocial Services  Follow up required by staff             Vocational Rehabilitation: Provide vocational rehab assistance to qualifying candidates.   Vocational Rehab Evaluation & Intervention:  Vocational Rehab - 03/11/22 1228       Initial Vocational Rehab Evaluation & Intervention   Assessment shows need for Vocational Rehabilitation No   retired            Education: Education Goals: Education classes will be provided on a variety of topics geared toward better understanding of heart health and risk factor modification. Participant will state understanding/return demonstration of topics presented as noted by education test scores.  Learning Barriers/Preferences:  Learning Barriers/Preferences - 02/28/22 0956       Learning Barriers/Preferences   Learning Barriers Hearing    Learning Preferences None             General Cardiac Education Topics:  AED/CPR: - Group verbal and written instruction with the use of models to demonstrate the basic use of the AED with the basic ABC's of resuscitation.   Anatomy and Cardiac Procedures: - Group verbal and visual presentation and models provide information about basic cardiac anatomy and function. Reviews the testing methods done to diagnose heart disease and the outcomes of the test results. Describes the treatment choices: Medical Management, Angioplasty, or Coronary Bypass Surgery for treating various heart conditions including Myocardial Infarction, Angina, Valve Disease, and Cardiac  Arrhythmias.  Written material given at graduation. Flowsheet Row Cardiac Rehab from 04/10/2022 in [facility] Cardiac and Pulmonary Rehab  Education need identified 03/11/22       Medication Safety: - Group verbal and visual instruction to review commonly prescribed medications for heart and lung disease. Reviews the medication, class of the drug, and side effects. Includes the steps to properly store meds and  maintain the prescription regimen.  Written material given at graduation. Flowsheet Row Cardiac Rehab from 04/10/2022 in [facility] Cardiac and Pulmonary Rehab  Date 03/13/22  Educator SB  Instruction Review Code 1- Verbalizes Understanding       Intimacy: - Group verbal instruction through game format to discuss how heart and lung disease can affect sexual intimacy. Written material given at graduation..   Know Your Numbers and Heart Failure: - Group verbal and visual instruction to discuss disease risk factors for cardiac and pulmonary disease and treatment options.  Reviews associated critical values for Overweight/Obesity, Hypertension, Cholesterol, and Diabetes.  Discusses basics of heart failure: signs/symptoms and treatments.  Introduces Heart Failure Zone chart for action plan for heart failure.  Written material given at graduation. Flowsheet Row Cardiac Rehab from 04/10/2022 in [facility] Cardiac and Pulmonary Rehab  Education need identified 03/11/22  Date 03/20/22  Educator SB  Instruction Review Code 1- Verbalizes Understanding       Infection Prevention: - Provides verbal and written material to individual with discussion of infection control including proper hand washing and proper equipment cleaning during exercise session. Flowsheet Row Cardiac Rehab from 04/10/2022 in [facility] Cardiac and Pulmonary Rehab  Date 02/28/22  Educator [facility]  Instruction Review Code 1- Verbalizes Understanding       Falls Prevention: - Provides verbal and written material to individual with discussion  of falls prevention and safety. Flowsheet Row Cardiac Rehab from 04/10/2022 in [facility] Cardiac and Pulmonary Rehab  Date 02/28/22  Educator [facility]  Instruction Review Code 1- Verbalizes Understanding       Other: -Provides group and verbal instruction on various topics (see comments)   Knowledge Questionnaire Score:  Knowledge Questionnaire Score - 03/11/22 1228       Knowledge Questionnaire Score   Pre Score 16/26             Core Components/Risk Factors/Patient Goals at Admission:  Personal Goals and Risk Factors at Admission - 03/11/22 1228       Core Components/Risk Factors/Patient Goals on Admission    Weight Management Yes;Weight Loss    Intervention Weight Management: Develop a combined nutrition and exercise program designed to reach desired caloric intake, while maintaining appropriate intake of nutrient and fiber, sodium and fats, and appropriate energy expenditure required for the weight goal.;Weight Management: Provide education and appropriate resources to help participant work on and attain dietary goals.;Weight Management/Obesity: Establish reasonable short term and long term weight goals.    Admit Weight 227 lb 12.8 oz (103.3 kg)    Goal Weight: Short Term 222 lb (100.7 kg)    Goal Weight: Long Term 215 lb (97.5 kg)    Expected Outcomes Short Term: Continue to assess and modify interventions until short term weight is achieved;Long Term: Adherence to nutrition and physical activity/exercise program aimed toward attainment of established weight goal;Weight Loss: Understanding of general recommendations for a balanced deficit meal plan, which promotes 1-2 lb weight loss per week and includes a negative energy balance of [phone] kcal/d;Understanding recommendations for meals to include 15-35% energy as protein, 25-35% energy from fat, 35-60% energy from carbohydrates, less than 248m of dietary cholesterol, 20-35 gm of total fiber daily;Understanding of distribution of  calorie intake throughout the day with the consumption of 4-5 meals/snacks    Diabetes Yes    Intervention Provide education about signs/symptoms and action to take for hypo/hyperglycemia.;Provide education about proper nutrition, including hydration, and aerobic/resistive exercise prescription along with prescribed medications to achieve blood glucose in normal ranges:  Fasting glucose 65-99 mg/dL    Expected Outcomes Short Term: Participant verbalizes understanding of the signs/symptoms and immediate care of hyper/hypoglycemia, proper foot care and importance of medication, aerobic/resistive exercise and nutrition plan for blood glucose control.;Long Term: Attainment of HbA1C < 7%.    Heart Failure Yes    Intervention Provide a combined exercise and nutrition program that is supplemented with education, support and counseling about heart failure. Directed toward relieving symptoms such as shortness of breath, decreased exercise tolerance, and extremity edema.    Expected Outcomes Improve functional capacity of life;Short term: Attendance in program 2-3 days a week with increased exercise capacity. Reported lower sodium intake. Reported increased fruit and vegetable intake. Reports medication compliance.;Long term: Adoption of self-care skills and reduction of barriers for early signs and symptoms recognition and intervention leading to self-care maintenance.;Short term: Daily weights obtained and reported for increase. Utilizing diuretic protocols set by physician.    Hypertension Yes    Intervention Provide education on lifestyle modifcations including regular physical activity/exercise, weight management, moderate sodium restriction and increased consumption of fresh fruit, vegetables, and low fat dairy, alcohol moderation, and smoking cessation.;Monitor prescription use compliance.    Expected Outcomes Short Term: Continued assessment and intervention until BP is < 140/74m HG in hypertensive  participants. < 130/850mHG in hypertensive participants with diabetes, heart failure or chronic kidney disease.;Long Term: Maintenance of blood pressure at goal levels.    Lipids Yes    Intervention Provide education and support for participant on nutrition & aerobic/resistive exercise along with prescribed medications to achieve LDL <704mHDL >41m13m  Expected Outcomes Short Term: Participant states understanding of desired cholesterol values and is compliant with medications prescribed. Participant is following exercise prescription and nutrition guidelines.;Long Term: Cholesterol controlled with medications as prescribed, with individualized exercise RX and with personalized nutrition plan. Value goals: LDL < 70mg50mL > 40 mg.             Education:Diabetes - Individual verbal and written instruction to review signs/symptoms of diabetes, desired ranges of glucose level fasting, after meals and with exercise. Acknowledge that pre and post exercise glucose checks will be done for 3 sessions at entry of program. [facility] [facility]  Date 02/28/22  [facility]- Verbalizes Understanding       Core Components/Risk Factors/Patient Goals Review:   Goals and Risk Factor Review     Row Name 03/27/22 0800             Core Components/Risk Factors/Patient Goals Review   Personal Goals Review Weight Management/Obesity;Heart Failure;Hypertension;Diabetes;Lipids       Review Don iTimmothy Soursoing well in rehab.  He is down on his weight with taking his fluid pill.  His leg swelling has also gone down and he feels less short of breath.  His sugars are doing well and he is checking them at home with his brother helping him.  His blood pressures are also doing well.  He notes that he has had some chest pain recently but has not told his doctor about them yet.       Expected Outcomes Short: Continue to work on weight  loss and let doctor know about chest pain Long; Continue to monitor risk factors                Core Components/Risk Factors/Patient Goals at Discharge (Final Review):   Goals and Risk Factor Review -  03/27/22 0800       Core Components/Risk Factors/Patient Goals Review   Personal Goals Review Weight Management/Obesity;Heart Failure;Hypertension;Diabetes;Lipids    Review Timmothy Sours is doing well in rehab.  He is down on his weight with taking his fluid pill.  His leg swelling has also gone down and he feels less short of breath.  His sugars are doing well and he is checking them at home with his brother helping him.  His blood pressures are also doing well.  He notes that he has had some chest pain recently but has not told his doctor about them yet.    Expected Outcomes Short: Continue to work on weight loss and let doctor know about chest pain Long; Continue to monitor risk factors             ITP Comments:  ITP Comments     Row Name 02/28/22 1024 03/11/22 1210 03/13/22 0651 03/13/22 0825 04/10/22 0908   ITP Comments Virtual Visit completed. Patient informed on EP and RD appointment and 6 Minute walk test. Patient also informed of patient health questionnaires on My Chart. Patient Verbalizes understanding. Visit diagnosis can be found in [facility] 12/10/2021. Completed 6MWT and gym orientation. Initial ITP created and sent for review to Dr. Emily Filbert, Medical Director. 30 Day review completed. Medical Director ITP review done, changes made as directed, and signed approval by Medical Director.   New to program First full day of exercise!  Patient was oriented to gym and equipment including functions, settings, policies, and procedures.  Patient's individual exercise prescription and treatment plan were reviewed.  All starting workloads were established based on the results of the 6 minute walk test done at initial orientation visit.  The plan for exercise progression was also introduced and  progression will be customized based on patient's performance and goals. 30 Day review completed. Medical Director ITP review done, changes made as directed, and signed approval by Medical Director.            Comments:

## 2022-04-10 NOTE — Progress Notes (Signed)
Daily Session Note  Patient Details  Name: Shane Holloway MRN: [id number] Date of Birth: 1950/09/04 Referring Provider:   Flowsheet Row Cardiac Rehab from 03/11/2022 in [facility] Cardiac and Pulmonary Rehab  Referring Provider Bernell List MD       Encounter Date: 04/10/2022  Check In:  Session Check In - 04/10/22 0754       Check-In   Supervising physician immediately available to respond to emergencies See telemetry face sheet for immediately available ER MD    Location [facility]-Cardiac & Pulmonary Rehab    Staff Present Antionette Fairy, BS, Exercise Physiologist;Joseph Rosebud Poles, RN, [place]    Virtual Visit No    Medication changes reported     No    Fall or balance concerns reported    No    Warm-up and Cool-down Performed on first and last piece of equipment    Resistance Training Performed Yes    VAD Patient? No    PAD/SET Patient? No      Pain Assessment   Currently in Pain? No/denies                Social History   Tobacco Use  Smoking Status Former   Packs/day: 0.25   Years: 4.00   Total pack years: 1.00   Types: Cigarettes   Quit date: 1995   Years since quitting: 28.8  Smokeless Tobacco Former   Types: Chew   Quit date: 1995    Goals Met:  Independence with exercise equipment Exercise tolerated well No report of concerns or symptoms today Strength training completed today  Goals Unmet:  Not Applicable  Comments: Pt able to follow exercise prescription today without complaint.  Will continue to monitor for progression.    Dr. Emily Filbert is Medical Director for [place].  Dr. Ottie Glazier is Medical Director for [facility] Pulmonary Rehabilitation.

## 2022-04-12 ENCOUNTER — Encounter: Payer: Medicare Other | Admitting: *Deleted

## 2022-04-12 DIAGNOSIS — I252 Old myocardial infarction: Secondary | ICD-10-CM | POA: Diagnosis not present

## 2022-04-12 DIAGNOSIS — I214 Non-ST elevation (NSTEMI) myocardial infarction: Secondary | ICD-10-CM

## 2022-04-12 NOTE — Progress Notes (Signed)
Daily Session Note  Patient Details  Name: Shane Holloway MRN: [id number] Date of Birth: [birth year] Referring Provider:   Flowsheet Row Cardiac Rehab from 03/11/2022 in [facility] Cardiac and Pulmonary Rehab  Referring Provider Bernell List MD       Encounter Date: 04/12/2022  Check In:  Session Check In - 04/12/22 0825       Check-In   Supervising physician immediately available to respond to emergencies See telemetry face sheet for immediately available ER MD    Location [facility]-Cardiac & Pulmonary Rehab    Staff Present Heath Lark, RN, BSN, CCRP;Joseph Echelon, RCP,RRT,BSRT;Jessica March [facility], [place], [place], CCRP, CCET    Virtual Visit No    Medication changes reported     No    Fall or balance concerns reported    No    Warm-up and Cool-down Performed on first and last piece of equipment    Resistance Training Performed Yes    VAD Patient? No    PAD/SET Patient? No      Pain Assessment   Currently in Pain? No/denies                Social History   Tobacco Use  Smoking Status Former   Packs/day: 0.25   Years: 4.00   Total pack years: 1.00   Types: Cigarettes   Quit date: 1995   Years since quitting: 28.8  Smokeless Tobacco Former   Types: Chew   Quit date: 1995    Goals Met:  Independence with exercise equipment Exercise tolerated well No report of concerns or symptoms today  Goals Unmet:  Not Applicable  Comments: Pt able to follow exercise prescription today without complaint.  Will continue to monitor for progression.    Dr. Emily Filbert is Medical Director for [place].  Dr. Ottie Glazier is Medical Director for [facility] Pulmonary Rehabilitation.

## 2022-04-15 ENCOUNTER — Encounter: Payer: Medicare Other | Admitting: *Deleted

## 2022-04-17 ENCOUNTER — Encounter: Payer: Medicare Other | Admitting: *Deleted

## 2022-04-17 DIAGNOSIS — I252 Old myocardial infarction: Secondary | ICD-10-CM | POA: Diagnosis not present

## 2022-04-17 DIAGNOSIS — I214 Non-ST elevation (NSTEMI) myocardial infarction: Secondary | ICD-10-CM

## 2022-04-17 NOTE — Progress Notes (Signed)
Daily Session Note  Patient Details  Name: Shane Holloway MRN: [id number] Date of Birth: Jan 14, 1951 Referring Provider:   Flowsheet Row Cardiac Rehab from 03/11/2022 in [facility] Cardiac and Pulmonary Rehab  Referring Provider Bernell List MD       Encounter Date: 04/17/2022  Check In:  Session Check In - 04/17/22 0814       Check-In   Supervising physician immediately available to respond to emergencies See telemetry face sheet for immediately available ER MD    Location [facility]-Cardiac & Pulmonary Rehab    Staff Present Antionette Fairy, BS, Exercise Physiologist;Joseph Rosebud Poles, RN, [place]    Virtual Visit No    Medication changes reported     No    Fall or balance concerns reported    No    Warm-up and Cool-down Performed on first and last piece of equipment    Resistance Training Performed Yes    VAD Patient? No    PAD/SET Patient? No      Pain Assessment   Currently in Pain? No/denies                Social History   Tobacco Use  Smoking Status Former   Packs/day: 0.25   Years: 4.00   Total pack years: 1.00   Types: Cigarettes   Quit date: 1995   Years since quitting: 28.8  Smokeless Tobacco Former   Types: Chew   Quit date: 1995    Goals Met:  Independence with exercise equipment Exercise tolerated well No report of concerns or symptoms today Strength training completed today  Goals Unmet:  Not Applicable  Comments: Pt able to follow exercise prescription today without complaint.  Will continue to monitor for progression.    Dr. Emily Filbert is Medical Director for [place].  Dr. Ottie Glazier is Medical Director for [facility] Pulmonary Rehabilitation.

## 2022-04-19 ENCOUNTER — Encounter: Payer: Medicare Other | Admitting: *Deleted

## 2022-04-19 DIAGNOSIS — I214 Non-ST elevation (NSTEMI) myocardial infarction: Secondary | ICD-10-CM

## 2022-04-19 DIAGNOSIS — I252 Old myocardial infarction: Secondary | ICD-10-CM | POA: Diagnosis not present

## 2022-04-19 NOTE — Progress Notes (Signed)
Daily Session Note  Patient Details  Name: Shane Holloway MRN: [id number] Date of Birth: [birth year] Referring Provider:   Flowsheet Row Cardiac Rehab from 03/11/2022 in [facility] Cardiac and Pulmonary Rehab  Referring Provider Bernell List MD       Encounter Date: 04/19/2022  Check In:  Session Check In - 04/19/22 0918       Check-In   Supervising physician immediately available to respond to emergencies See telemetry face sheet for immediately available ER MD    Location [facility]-Cardiac & Pulmonary Rehab    Staff Present Heath Lark, RN, BSN, CCRP;Jessica [place], MA, RCEP, CCRP, CCET;Joseph [place], [place]    Virtual Visit No    Medication changes reported     No    Fall or balance concerns reported    No    Warm-up and Cool-down Performed on first and last piece of equipment    Resistance Training Performed Yes    VAD Patient? No    PAD/SET Patient? No      Pain Assessment   Currently in Pain? No/denies                Social History   Tobacco Use  Smoking Status Former   Packs/day: 0.25   Years: 4.00   Total pack years: 1.00   Types: Cigarettes   Quit date: 1995   Years since quitting: 28.8  Smokeless Tobacco Former   Types: Chew   Quit date: 1995    Goals Met:  Independence with exercise equipment Exercise tolerated well No report of concerns or symptoms today  Goals Unmet:  Not Applicable  Comments: Pt able to follow exercise prescription today without complaint.  Will continue to monitor for progression.    Dr. Emily Filbert is Medical Director for [place].  Dr. Ottie Glazier is Medical Director for [facility] Pulmonary Rehabilitation.

## 2022-04-22 ENCOUNTER — Encounter: Payer: Medicare Other | Admitting: *Deleted

## 2022-04-22 DIAGNOSIS — I214 Non-ST elevation (NSTEMI) myocardial infarction: Secondary | ICD-10-CM

## 2022-04-22 DIAGNOSIS — I252 Old myocardial infarction: Secondary | ICD-10-CM | POA: Diagnosis not present

## 2022-04-22 NOTE — Progress Notes (Signed)
Daily Session Note  Patient Details  Name: Shane Holloway MRN: [id number] Date of Birth: 12-13-1950 Referring Provider:   Flowsheet Row Cardiac Rehab from 03/11/2022 in [facility] Cardiac and Pulmonary Rehab  Referring Provider Bernell List MD       Encounter Date: 04/22/2022  Check In:  Session Check In - 04/22/22 0753       Check-In   Supervising physician immediately available to respond to emergencies See telemetry face sheet for immediately available ER MD    Location [facility]-Cardiac & Pulmonary Rehab    Staff Present Alberteen Sam, MA, RCEP, CCRP, CCET;Joseph Hayward, [place], RN, Doyce Para, BS, ACSM CEP, Exercise Physiologist    Virtual Visit No    Medication changes reported     No    Fall or balance concerns reported    No    Warm-up and Cool-down Performed on first and last piece of equipment    Resistance Training Performed Yes    VAD Patient? No    PAD/SET Patient? No      Pain Assessment   Currently in Pain? No/denies                Social History   Tobacco Use  Smoking Status Former   Packs/day: 0.25   Years: 4.00   Total pack years: 1.00   Types: Cigarettes   Quit date: 1995   Years since quitting: 28.8  Smokeless Tobacco Former   Types: Chew   Quit date: 1995    Goals Met:  Independence with exercise equipment Exercise tolerated well No report of concerns or symptoms today Strength training completed today  Goals Unmet:  Not Applicable  Comments: Pt able to follow exercise prescription today without complaint.  Will continue to monitor for progression.    Dr. Emily Filbert is Medical Director for [place].  Dr. Ottie Glazier is Medical Director for [facility] Pulmonary Rehabilitation.

## 2022-04-24 ENCOUNTER — Encounter: Payer: Medicare Other | Admitting: *Deleted

## 2022-04-26 ENCOUNTER — Encounter: Payer: Medicare Other | Admitting: *Deleted

## 2022-04-26 DIAGNOSIS — I252 Old myocardial infarction: Secondary | ICD-10-CM | POA: Diagnosis not present

## 2022-04-26 DIAGNOSIS — I214 Non-ST elevation (NSTEMI) myocardial infarction: Secondary | ICD-10-CM

## 2022-04-26 NOTE — Progress Notes (Signed)
Daily Session Note  Patient Details  Name: Shane Holloway MRN: [id number] Date of Birth: 1950/08/19 Referring Provider:   Flowsheet Row Cardiac Rehab from 03/11/2022 in [facility] Cardiac and Pulmonary Rehab  Referring Provider Bernell List MD       Encounter Date: 04/26/2022  Check In:  Session Check In - 04/26/22 0757       Check-In   Supervising physician immediately available to respond to emergencies See telemetry face sheet for immediately available ER MD    Location [facility]-Cardiac & Pulmonary Rehab    Staff Present Alberteen Sam, MA, RCEP, CCRP, CCET;Joseph [place], [place], RN, [place]    Virtual Visit No    Medication changes reported     No    Fall or balance concerns reported    No    Warm-up and Cool-down Performed on first and last piece of equipment    Resistance Training Performed Yes    VAD Patient? No    PAD/SET Patient? No      Pain Assessment   Currently in Pain? No/denies                Social History   Tobacco Use  Smoking Status Former   Packs/day: 0.25   Years: 4.00   Total pack years: 1.00   Types: Cigarettes   Quit date: 1995   Years since quitting: 28.8  Smokeless Tobacco Former   Types: Chew   Quit date: 1995    Goals Met:  Independence with exercise equipment Exercise tolerated well No report of concerns or symptoms today Strength training completed today  Goals Unmet:  Not Applicable  Comments: Pt able to follow exercise prescription today without complaint.  Will continue to monitor for progression.    Dr. Emily Filbert is Medical Director for [place].  Dr. Ottie Glazier is Medical Director for [facility] Pulmonary Rehabilitation.

## 2022-04-29 ENCOUNTER — Encounter: Payer: Medicare Other | Admitting: *Deleted

## 2022-05-01 ENCOUNTER — Encounter: Payer: Medicare Other | Admitting: *Deleted

## 2022-05-01 DIAGNOSIS — I214 Non-ST elevation (NSTEMI) myocardial infarction: Secondary | ICD-10-CM

## 2022-05-01 DIAGNOSIS — I252 Old myocardial infarction: Secondary | ICD-10-CM | POA: Diagnosis not present

## 2022-05-01 NOTE — Progress Notes (Signed)
Daily Session Note  Patient Details  Name: Shane Holloway MRN: [id number] Date of Birth: November 12, 1950 Referring Provider:   Flowsheet Row Cardiac Rehab from 03/11/2022 in [facility] Cardiac and Pulmonary Rehab  Referring Provider Bernell List MD       Encounter Date: 05/01/2022  Check In:  Session Check In - 05/01/22 0747       Check-In   Supervising physician immediately available to respond to emergencies See telemetry face sheet for immediately available ER MD    Location [facility]-Cardiac & Pulmonary Rehab    Staff Present Darlyne Russian, RN, ADN;Susanne Bice, RN, BSN, CCRP;Noah Tickle, BS, Exercise Physiologist;Kara Eliezer Bottom, MS, ASCM CEP, Exercise Physiologist    Virtual Visit No    Medication changes reported     No    Fall or balance concerns reported    No    Warm-up and Cool-down Performed on first and last piece of equipment    Resistance Training Performed Yes    VAD Patient? No    PAD/SET Patient? No      Pain Assessment   Currently in Pain? No/denies                Social History   Tobacco Use  Smoking Status Former   Packs/day: 0.25   Years: 4.00   Total pack years: 1.00   Types: Cigarettes   Quit date: 1995   Years since quitting: 28.9  Smokeless Tobacco Former   Types: Chew   Quit date: 1995    Goals Met:  Independence with exercise equipment Exercise tolerated well No report of concerns or symptoms today Strength training completed today  Goals Unmet:  Not Applicable  Comments: Pt able to follow exercise prescription today without complaint.  Will continue to monitor for progression.    Dr. Emily Filbert is Medical Director for [place].  Dr. Ottie Glazier is Medical Director for [facility] Pulmonary Rehabilitation.

## 2022-05-06 ENCOUNTER — Encounter: Payer: Medicare Other | Admitting: *Deleted

## 2022-05-06 DIAGNOSIS — I214 Non-ST elevation (NSTEMI) myocardial infarction: Secondary | ICD-10-CM

## 2022-05-06 DIAGNOSIS — I252 Old myocardial infarction: Secondary | ICD-10-CM | POA: Diagnosis not present

## 2022-05-06 NOTE — Progress Notes (Signed)
Daily Session Note  Patient Details  Name: Shane Holloway MRN: [id number] Date of Birth: 03-26-1951 Referring Provider:   Flowsheet Row Cardiac Rehab from 03/11/2022 in [facility] Cardiac and Pulmonary Rehab  Referring Provider Bernell List MD       Encounter Date: 05/06/2022  Check In:  Session Check In - 05/06/22 0810       Check-In   Supervising physician immediately available to respond to emergencies See telemetry face sheet for immediately available ER MD    Location [facility]-Cardiac & Pulmonary Rehab    Staff Present Heath Lark, RN, BSN, CCRP;Jessica [place], MA, RCEP, CCRP, Mindi Curling, RN, [place]    Virtual Visit No    Medication changes reported     No    Fall or balance concerns reported    No    Warm-up and Cool-down Performed on first and last piece of equipment    Resistance Training Performed Yes    VAD Patient? No    PAD/SET Patient? No      Pain Assessment   Currently in Pain? No/denies                Social History   Tobacco Use  Smoking Status Former   Packs/day: 0.25   Years: 4.00   Total pack years: 1.00   Types: Cigarettes   Quit date: 1995   Years since quitting: 28.9  Smokeless Tobacco Former   Types: Chew   Quit date: 1995    Goals Met:  Independence with exercise equipment Exercise tolerated well No report of concerns or symptoms today Strength training completed today  Goals Unmet:  Not Applicable  Comments: Pt able to follow exercise prescription today without complaint.  Will continue to monitor for progression.    Dr. Emily Filbert is Medical Director for [place].  Dr. Ottie Glazier is Medical Director for [facility] Pulmonary Rehabilitation.

## 2022-05-08 ENCOUNTER — Encounter: Payer: Self-pay | Admitting: *Deleted

## 2022-05-08 ENCOUNTER — Encounter: Payer: Medicare Other | Admitting: *Deleted

## 2022-05-08 DIAGNOSIS — I214 Non-ST elevation (NSTEMI) myocardial infarction: Secondary | ICD-10-CM

## 2022-05-08 DIAGNOSIS — I252 Old myocardial infarction: Secondary | ICD-10-CM | POA: Diagnosis not present

## 2022-05-08 NOTE — Progress Notes (Signed)
Cardiac Individual Treatment Plan  Patient Details  Name: Shane Holloway MRN: [id number] Date of Birth: Oct 23, 1950 Referring Provider:   Flowsheet Row Cardiac Rehab from 03/11/2022 in [facility] Cardiac and Pulmonary Rehab  Referring Provider Bernell List MD       Initial Encounter Date:  Flowsheet Row Cardiac Rehab from 03/11/2022 in [facility] Cardiac and Pulmonary Rehab  Date 03/11/22       Visit Diagnosis: NSTEMI (non-ST elevated myocardial infarction) [facility])  Patient's Home Medications on Admission:  Current Outpatient Medications:    amiodarone (PACERONE) 200 MG tablet, Take 1 tablet (200 mg total) by mouth daily., Disp: 30 tablet, Rfl: 0   amiodarone (PACERONE) 200 MG tablet, Take 1 tablet by mouth daily., Disp: , Rfl:    amLODipine (NORVASC) 2.5 MG tablet, Take 2.5 mg by mouth daily. (Patient not taking: Reported on 02/28/2022), Disp: , Rfl:    apixaban (ELIQUIS) 5 MG TABS tablet, Take by mouth., Disp: , Rfl:    atorvastatin (LIPITOR) 80 MG tablet, Take 80 mg by mouth daily., Disp: , Rfl:    benzonatate (TESSALON PERLES) 100 MG capsule, Take 1 capsule (100 mg total) by mouth 3 (three) times daily as needed for cough. (Patient not taking: Reported on 02/28/2022), Disp: 20 capsule, Rfl: 0   Cholecalciferol 125 MCG (5000 UT) TABS, Take 1 tablet by mouth daily., Disp: , Rfl:    cyclobenzaprine (FLEXERIL) 5 MG tablet, Take 5 mg by mouth 2 (two) times daily. (Patient not taking: Reported on 02/28/2022), Disp: , Rfl:    ELIQUIS 5 MG TABS tablet, Take 5 mg by mouth 2 (two) times daily., Disp: , Rfl:    empagliflozin (JARDIANCE) 10 MG TABS tablet, Take 1 tablet by mouth daily., Disp: , Rfl:    finasteride (PROSCAR) 5 MG tablet, Take 5 mg by mouth daily. (Patient not taking: Reported on 02/28/2022), Disp: , Rfl:    furosemide (LASIX) 20 MG tablet, Take 1 tablet (20 mg total) by mouth daily., Disp: 30 tablet, Rfl: 3   glipiZIDE (GLUCOTROL XL) 10 MG 24 hr tablet, Take 10 mg by mouth daily., Disp: ,  Rfl:    glipiZIDE (GLUCOTROL XL) 10 MG 24 hr tablet, Take 1 tablet by mouth daily. (Patient not taking: Reported on 02/28/2022), Disp: , Rfl:    JARDIANCE 10 MG TABS tablet, Take 10 mg by mouth daily., Disp: , Rfl:    losartan (COZAAR) 100 MG tablet, Take 100 mg by mouth daily. (Patient not taking: Reported on 02/28/2022), Disp: , Rfl:    losartan (COZAAR) 100 MG tablet, Take 1 tablet by mouth daily., Disp: , Rfl:    metoprolol tartrate (LOPRESSOR) 50 MG tablet, Take 1 tablet (50 mg total) by mouth 2 (two) times daily. (Patient not taking: Reported on 02/28/2022), Disp: 60 tablet, Rfl: 0   metoprolol tartrate (LOPRESSOR) 50 MG tablet, Take by mouth., Disp: , Rfl:    nitroGLYCERIN (NITROSTAT) 0.4 MG SL tablet, Place under the tongue., Disp: , Rfl:    Tamsulosin HCl (FLOMAX) 0.4 MG CAPS, TAKE ONE CAPSULE BY MOUTH EVERY DAY (Patient not taking: Reported on 02/28/2022), Disp: 30 capsule, Rfl: 2  Past Medical History: Past Medical History:  Diagnosis Date   Allergy    Aortic stenosis    Arthritis    hands   BPH (benign prostatic hyperplasia)    CHF (congestive heart failure) (HCC)    CKD (chronic kidney disease)    Diabetes mellitus    Dyspnea    Hyperlipidemia    Hypertension  Myocardial infarction ([place]) 06/10/2002   Stroke ([place]) 09/2020   Some memory deficits   Vertigo    Wears dentures    full upper    Tobacco Use: Social History   Tobacco Use  Smoking Status Former   Packs/day: 0.25   Years: 4.00   Total pack years: 1.00   Types: Cigarettes   Quit date: 1995   Years since quitting: 28.9  Smokeless Tobacco Former   Types: Chew   Quit date: 1995    Labs: Review Flowsheet       Latest Ref Rng & Units 07/27/2007 07/29/2007 08/10/2011 12/11/2021  Labs for ITP Cardiac and Pulmonary Rehab  Cholestrol 0 - 200 mg/dL - - 178  171   LDL (calc) 0 - 99 mg/dL - - 117  UNABLE TO CALCULATE IF TRIGLYCERIDE OVER 400 mg/dL   Direct LDL 0 - 99 mg/dL - - - 58.1   HDL-C >40 mg/dL - - 35  28    Trlycerides <150 mg/dL - - 130  509   Hemoglobin A1c 4.8 - 5.6 % 7.2 (NOTE)   The ADA recommends the following therapeutic goals for glycemic   control related to Hgb A1C measurement:   Goal of Therapy:   < 7.0% Hgb A1C   Action Suggested:  > 8.0% Hgb A1C   Ref:  Diabetes Care, 22, Suppl. 1, 1999  - 7.3  8.9   PH, Arterial - 7.423  7.380  7.363  7.391  7.351  - -  PCO2 arterial - 38.6  37.7  40.2  39.9  49.2  - -  Bicarbonate - 24.8  22.3  22.6  24.4  27.2  - -  TCO2 - 26.0  _0 - -  Acid-base deficit - - 3.0  2.0  1.0  - -  O2 Saturation - 96.9  95.0  91.0  100.0  - -     Exercise Target Goals: Exercise Program Goal: Individual exercise prescription set using results from initial 6 min walk test and THRR while considering  patient's activity barriers and safety.   Exercise Prescription Goal: Initial exercise prescription builds to 30-45 minutes a day of aerobic activity, 2-3 days per week.  Home exercise guidelines will be given to patient during program as part of exercise prescription that the participant will acknowledge.   Education: Aerobic Exercise: - Group verbal and visual presentation on the components of exercise prescription. Introduces F.I.T.T principle from ACSM for exercise prescriptions.  Reviews F.I.T.T. principles of aerobic exercise including progression. Written material given at graduation. Flowsheet Row Cardiac Rehab from 05/01/2022 in [facility] Cardiac and Pulmonary Rehab  Education need identified 03/11/22  Date 04/17/22  Educator [facility]  Instruction Review Code 1- Verbalizes Understanding       Education: Resistance Exercise: - Group verbal and visual presentation on the components of exercise prescription. Introduces F.I.T.T principle from ACSM for exercise prescriptions  Reviews F.I.T.T. principles of resistance exercise including progression. Written material given at graduation.    Education: Exercise & Equipment Safety: - Individual verbal  instruction and demonstration of equipment use and safety with use of the equipment. Flowsheet Row Cardiac Rehab from 05/01/2022 in [facility] Cardiac and Pulmonary Rehab  Date 02/28/22  Educator [facility]  Instruction Review Code 1- Verbalizes Understanding       Education: Exercise Physiology & General Exercise Guidelines: - Group verbal and written instruction with models to review the exercise physiology of the cardiovascular system and associated critical  values. Provides general exercise guidelines with specific guidelines to those with heart or lung disease.    Education: Flexibility, Balance, Mind/Body Relaxation: - Group verbal and visual presentation with interactive activity on the components of exercise prescription. Introduces F.I.T.T principle from ACSM for exercise prescriptions. Reviews F.I.T.T. principles of flexibility and balance exercise training including progression. Also discusses the mind body connection.  Reviews various relaxation techniques to help reduce and manage stress (i.e. Deep breathing, progressive muscle relaxation, and visualization). Balance handout provided to take home. Written material given at graduation.   Activity Barriers & Risk Stratification:  Activity Barriers & Cardiac Risk Stratification - 03/11/22 1222       Activity Barriers & Cardiac Risk Stratification   Activity Barriers Deconditioning;Muscular Weakness;Balance Concerns;History of Falls;Shortness of Breath;Other (comment);Assistive Device    Comments cognitive impairment, Hx CVA    Cardiac Risk Stratification High             6 Minute Walk:  6 Minute Walk     Row Name 03/11/22 1211         6 Minute Walk   Phase Initial     Distance 860 feet     Walk Time 6 minutes     # of Rest Breaks 0     MPH 1.63     METS 1.85     RPE 9     Perceived Dyspnea  1     VO2 Peak 6.47     Symptoms Yes (comment)     Comments SOB, leg pain 5/10     Resting HR 80 bpm     Resting BP 114/62      Resting Oxygen Saturation  96 %     Exercise Oxygen Saturation  during 6 min walk 96 %     Max Ex. HR 95 bpm     Max Ex. BP 126/74     2 Minute Post BP 122/70              Oxygen Initial Assessment:   Oxygen Re-Evaluation:   Oxygen Discharge (Final Oxygen Re-Evaluation):   Initial Exercise Prescription:  Initial Exercise Prescription - 03/11/22 1200       Date of Initial Exercise RX and Referring Provider   Date 03/11/22    Referring Provider Bernell List MD      Oxygen   Maintain Oxygen Saturation 88% or higher      Treadmill   MPH 1.5    Grade 0    Minutes 15    METs 2.15      NuStep   Level 2    SPM 80    Minutes 15    METs 2      Track   Laps 22    Minutes 15    METs 2.2      Prescription Details   Frequency (times per week) 3    Duration Progress to 30 minutes of continuous aerobic without signs/symptoms of physical distress      Intensity   THRR 40-80% of Max Heartrate 107-134    Ratings of Perceived Exertion 11-13    Perceived Dyspnea 0-4      Progression   Progression Continue to progress workloads to maintain intensity without signs/symptoms of physical distress.      Resistance Training   Training Prescription Yes    Weight 4 lb    Reps 10-15             Perform Capillary Blood Glucose checks as  needed.  Exercise Prescription Changes:   Exercise Prescription Changes     Row Name 03/11/22 1200 03/27/22 0700 04/08/22 1000 04/22/22 1400 05/06/22 1100     Response to Exercise   Blood Pressure (Admit) 114/62 -- 128/76 118/70 134/80   Blood Pressure (Exercise) 126/74 -- 132/62 158/82 --   Blood Pressure (Exit) 122/70 -- 130/82 120/80 144/82   Heart Rate (Admit) 80 bpm -- 76 bpm 70 bpm 73 bpm   Heart Rate (Exercise) 95 bpm -- 98 bpm 95 bpm 84 bpm   Heart Rate (Exit) 80 bpm -- 72 bpm 77 bpm 75 bpm   Oxygen Saturation (Admit) 96 % -- -- -- --   Oxygen Saturation (Exercise) 96 % -- -- -- --   Rating of Perceived Exertion  (Exercise) 9 -- _0 Perceived Dyspnea (Exercise) 1 -- -- -- --   Symptoms SOB, leg pain 5/10 -- none none none   Comments walk test results -- 3rd full week of exercise -- --   Duration -- -- Progress to 30 minutes of  aerobic without signs/symptoms of physical distress Progress to 30 minutes of  aerobic without signs/symptoms of physical distress Continue with 30 min of aerobic exercise without signs/symptoms of physical distress.   Intensity -- -- THRR unchanged THRR unchanged THRR unchanged     Progression   Progression -- -- Continue to progress workloads to maintain intensity without signs/symptoms of physical distress. Continue to progress workloads to maintain intensity without signs/symptoms of physical distress. Continue to progress workloads to maintain intensity without signs/symptoms of physical distress.   Average METs -- -- 1.92 1.85 1.81     Resistance Training   Training Prescription -- -- Yes Yes Yes   Weight -- -- 4 lb 4 lb 4 lb   Reps -- -- 10-15 10-15 10-15     Interval Training   Interval Training -- -- No No No     Recumbant Bike   Level -- -- -- 2.8 --   Minutes -- -- -- 15 --   METs -- -- -- 2.4 --     NuStep   Level -- -- _1 Minutes -- -- _2 METs -- -- 2 2.1 1.6     T5 Nustep   Level -- -- -- -- 1   Minutes -- -- -- -- 15     Biostep-RELP   Level -- -- 2 -- --   Minutes -- -- 15 -- --   METs -- -- 2 -- --     Track   Laps -- -- _3 Minutes -- -- _4 METs -- -- 2.31 1.82 1.87     Home Exercise Plan   Plans to continue exercise at -- Home (comment)  walking, treadmill Home (comment)  walking, treadmill Home (comment)  walking, treadmill Home (comment)  walking, treadmill   Frequency -- Add 2 additional days to program exercise sessions. Add 2 additional days to program exercise sessions. Add 2 additional days to program exercise sessions. Add 2 additional days to program exercise sessions.   Initial Home Exercises  Provided -- 03/27/22 03/27/22 03/27/22 03/27/22     Oxygen   Maintain Oxygen Saturation -- -- 88% or higher 88% or higher 88% or higher            Exercise Comments:   Exercise Comments     Row Name 03/13/22 0825  Exercise Comments First full day of exercise!  Patient was oriented to gym and equipment including functions, settings, policies, and procedures.  Patient's individual exercise prescription and treatment plan were reviewed.  All starting workloads were established based on the results of the 6 minute walk test done at initial orientation visit.  The plan for exercise progression was also introduced and progression will be customized based on patient's performance and goals.                Exercise Goals and Review:   Exercise Goals     Row Name 03/11/22 1225             Exercise Goals   Increase Physical Activity Yes       Intervention Develop an individualized exercise prescription for aerobic and resistive training based on initial evaluation findings, risk stratification, comorbidities and participant's personal goals.;Provide advice, education, support and counseling about physical activity/exercise needs.       Expected Outcomes Short Term: Attend rehab on a regular basis to increase amount of physical activity.;Long Term: Add in home exercise to make exercise part of routine and to increase amount of physical activity.;Long Term: Exercising regularly at least 3-5 days a week.       Increase Strength and Stamina Yes       Intervention Provide advice, education, support and counseling about physical activity/exercise needs.;Develop an individualized exercise prescription for aerobic and resistive training based on initial evaluation findings, risk stratification, comorbidities and participant's personal goals.       Expected Outcomes Short Term: Increase workloads from initial exercise prescription for resistance, speed, and METs.;Short Term: Perform  resistance training exercises routinely during rehab and add in resistance training at home;Long Term: Improve cardiorespiratory fitness, muscular endurance and strength as measured by increased METs and functional capacity (6MWT)       Able to understand and use rate of perceived exertion (RPE) scale Yes       Intervention Provide education and explanation on how to use RPE scale       Expected Outcomes Short Term: Able to use RPE daily in rehab to express subjective intensity level;Long Term:  Able to use RPE to guide intensity level when exercising independently       Able to understand and use Dyspnea scale Yes       Intervention Provide education and explanation on how to use Dyspnea scale       Expected Outcomes Short Term: Able to use Dyspnea scale daily in rehab to express subjective sense of shortness of breath during exertion;Long Term: Able to use Dyspnea scale to guide intensity level when exercising independently       Knowledge and understanding of Target Heart Rate Range (THRR) Yes       Intervention Provide education and explanation of THRR including how the numbers were predicted and where they are located for reference       Expected Outcomes Short Term: Able to state/look up THRR;Short Term: Able to use daily as guideline for intensity in rehab;Long Term: Able to use THRR to govern intensity when exercising independently       Able to check pulse independently Yes       Intervention Provide education and demonstration on how to check pulse in carotid and radial arteries.;Review the importance of being able to check your own pulse for safety during independent exercise       Expected Outcomes Short Term: Able to explain why pulse checking is important  during independent exercise;Long Term: Able to check pulse independently and accurately       Understanding of Exercise Prescription Yes       Intervention Provide education, explanation, and written materials on patient's individual  exercise prescription       Expected Outcomes Short Term: Able to explain program exercise prescription;Long Term: Able to explain home exercise prescription to exercise independently                Exercise Goals Re-Evaluation :  Exercise Goals Re-Evaluation     Row Name 03/13/22 0826 03/27/22 0756 04/08/22 1049 04/19/22 0747 04/22/22 1441     Exercise Goal Re-Evaluation   Exercise Goals Review Able to understand and use rate of perceived exertion (RPE) scale;Able to understand and use Dyspnea scale;Knowledge and understanding of Target Heart Rate Range (THRR);Understanding of Exercise Prescription Able to understand and use rate of perceived exertion (RPE) scale;Able to understand and use Dyspnea scale;Knowledge and understanding of Target Heart Rate Range (THRR);Understanding of Exercise Prescription;Increase Strength and Stamina;Increase Physical Activity;Able to check pulse independently Increase Physical Activity;Increase Strength and Stamina;Understanding of Exercise Prescription Increase Physical Activity;Increase Strength and Stamina;Understanding of Exercise Prescription Increase Physical Activity;Increase Strength and Stamina;Understanding of Exercise Prescription   Comments Reviewed RPE and dyspnea scales, THR and program prescription with pt today.  Pt voiced understanding and was given a copy of goals to take home. Reviewed home exercise with pt today.  Pt plans to walk and use brother's treadmill for exercise.  Reviewed THR, pulse, RPE, sign and symptoms, pulse oximetery and when to call 911 or MD.  Also discussed weather considerations and indoor options.  Pt voiced understanding.  He is already starting to notice a difference in his stamina. Timmothy Sours is doing well for the first couple weeks he has been here. He has so far been able to walk 24 laps on the track and is now up to level 4 on the T4 Nustep. RPEs are staying in appropriate range. We will continue to monitor. Timmothy Sours is trying to  improve his levels in the program. He wants to workout at the [facility] when he is done with the program. His brother takes him to alot of appointments and he would like to workout with his brother. Timmothy Sours is doing well in the program. He has consistently worked at a MET level above 1.85 METs. He also started using the recumbent bike and has done well at level 2.8. His laps on the track did go down to 15 so we will encourage him to push for more laps. We will continue to monitor his progress in the program.   Expected Outcomes Short: Use RPE daily to regulate intensity. Long: Follow program prescription in THR. Short: Add in exercise on off days Long: continue to exercise indpendently Short: Continue to increase laps on track Long: Continue to increase overall MET level Short: start going to the [facility]. Long: maintain working out independently. Short: Continue to push for more laps on the track. Long: Continue to increase strength and stamina.    [place] Name 05/06/22 1145             Exercise Goal Re-Evaluation   Exercise Goals Review Increase Physical Activity;Increase Strength and Stamina;Understanding of Exercise Prescription       Comments Timmothy Sours continues to do well in the program. although he is limited right now by his back pain. He has not been able to walk as many laps on the track as before. He is now using  a pillow to help with lumbar support when on the seated machines. He also went down to level 2 on the T4 Nustep, staff will remind patient to keep level up and if not, increase beyond that if able. He is not quite hitting his THR. Will continue to monitor.       Expected Outcomes Short: Increase laps on track tolerated by back, increase seated machine levels back up.                Discharge Exercise Prescription (Final Exercise Prescription Changes):  Exercise Prescription Changes - 05/06/22 1100       Response to Exercise   Blood Pressure (Admit) 134/80    Blood Pressure (Exit) 144/82     Heart Rate (Admit) 73 bpm    Heart Rate (Exercise) 84 bpm    Heart Rate (Exit) 75 bpm    Rating of Perceived Exertion (Exercise) 13    Symptoms none    Duration Continue with 30 min of aerobic exercise without signs/symptoms of physical distress.    Intensity THRR unchanged      Progression   Progression Continue to progress workloads to maintain intensity without signs/symptoms of physical distress.    Average METs 1.81      Resistance Training   Training Prescription Yes    Weight 4 lb    Reps 10-15      Interval Training   Interval Training No      NuStep   Level 2    Minutes 15    METs 1.6      T5 Nustep   Level 1    Minutes 15      Track   Laps 16    Minutes 15    METs 1.87      Home Exercise Plan   Plans to continue exercise at Home (comment)   walking, treadmill   Frequency Add 2 additional days to program exercise sessions.    Initial Home Exercises Provided 03/27/22      Oxygen   Maintain Oxygen Saturation 88% or higher             Nutrition:  Target Goals: Understanding of nutrition guidelines, daily intake of sodium <1557m, cholesterol <202m calories 30% from fat and 7% or less from saturated fats, daily to have 5 or more servings of fruits and vegetables.  Education: All About Nutrition: -Group instruction provided by verbal, written material, interactive activities, discussions, models, and posters to present general guidelines for heart healthy nutrition including fat, fiber, MyPlate, the role of sodium in heart healthy nutrition, utilization of the nutrition label, and utilization of this knowledge for meal planning. Follow up email sent as well. Written material given at graduation.   Biometrics:  Pre Biometrics - 03/11/22 1225       Pre Biometrics   Height 6' 1.7" (1.872 m)    Weight 227 lb 12.8 oz (103.3 kg)    BMI (Calculated) 29.49    Single Leg Stand 1.21 seconds              Nutrition Therapy Plan and Nutrition Goals:   Nutrition Therapy & Goals - 03/11/22 1148       Nutrition Therapy   Diet Heart healthy, low Na, T2DM friendly    Protein (specify units) 85-90g    Fiber 30 grams    Whole Grain Foods 3 servings    Saturated Fats 17 max. grams    Fruits and Vegetables 8 servings/day  Sodium 1.5 grams      Personal Nutrition Goals   Nutrition Goal ST: include more fiber with meals - fruits/vegetables, whole grains. Limit sodium <1.5g/day LT: Manage A1C < 7, limited saturated fat <17g/day, limit sodium < 1.5g/day, include at least 30g of fiber/day.    Comments 71 y.o. M admitted to cardiac rehab s/p NSTEMI. PMHx includes CAD, CVA (earlier this year), cognitive impairment, HTN, T2DM (A1C 7.8), HLD, HFrEF, SVT, Aortic Stenosis, CKD stg 3. PSHx includes CABG x3 (2009), CABG x2 (2015). Relevant medications includes lipitor, glipizide, vit D3, jardiance. Karver reports that he has had increased swelling for a while now and recently his weight had gone up (he reports this was a sudden weight gain >5 pounds); highly encouraged him to speak to his MD regarding this as this is likely fluid retention especially as he has history with CKD stg 3 and HFrEF. Discussed limiting sodium to <1571m as this can help with fluid build-up as well; he reports using many canned and pre-prepared food items which can have a lot of sodium and well as how to build easy meals (sandwiches, eggs with toast and berries, microwaved grains with baked chicken and frozen vegetables) - reviewed label reading and provided handout. Donalds brother lives next door and he reports that his brother will help him go food shopping and cook; his brother was with him during his nutrition consultation today as well. DHaydonhad questions about eggs; discussed how eggs can be a part of a heart healthy diet, but encouaged him to balance this meal with whole wheat toast and/or some fruit like berries. Discussed heart healthy eating, sodium, and T2DM friendly eating.  ECay Schillingsto practice MyPlate and see what he can add to meals such as fiber rich foods to meals.      Intervention Plan   Intervention Prescribe, educate and counsel regarding individualized specific dietary modifications aiming towards targeted core components such as weight, hypertension, lipid management, diabetes, heart failure and other comorbidities.;Nutrition handout(s) given to patient.    Expected Outcomes Short Term Goal: Understand basic principles of dietary content, such as calories, fat, sodium, cholesterol and nutrients.;Short Term Goal: A plan has been developed with personal nutrition goals set during dietitian appointment.;Long Term Goal: Adherence to prescribed nutrition plan.             Nutrition Assessments:  MEDIFICTS Score Key: ?70 Need to make dietary changes  40-70 Heart Healthy Diet ? 40 Therapeutic Level Cholesterol Diet  [facility]  Picture Your Plate Total Score on Admission 41      Picture Your Plate Scores: <<27Unhealthy dietary pattern with much room for improvement. 41-50 Dietary pattern unlikely to meet recommendations for good health and room for improvement. 51-60 More healthful dietary pattern, with some room for improvement.  >60 Healthy dietary pattern, although there may be some specific behaviors that could be improved.    Nutrition Goals Re-Evaluation:  Nutrition Goals Re-Evaluation     ROhlmanName 03/27/22 0759 04/19/22 0750           Goals   Current Weight -- 234 lb (106.1 kg)      Nutrition Goal ST: include more fiber with meals - fruits/vegetables, whole grains. Limit sodium <1.5g/day LT: Manage A1C < 7, limited saturated fat <17g/day, limit sodium < 1.5g/day, include at least 30g of fiber/day. Cut back on portions.      Comment DTimmothy Soursis doing well in rehab.  He is doing well with his diet. He is trying to get in more fiber with more fruits and vegetables.   He has noticed that his stomach has already started to go down.  He is also taking his fluid pill. He is also watching his salt intake as well. Timmothy Sours states that he wants to lose weight but he eats to much. He usually eats at home. He states that he goes out sometimes. Informed him to use a smaller plate to help him with portion sizes.      Expected Outcome Short: Conitnue to add variety Long; Continue to improve diet Short: cut back on portions. Long: maintain portion sizes independently.               Nutrition Goals Discharge (Final Nutrition Goals Re-Evaluation):  Nutrition Goals Re-Evaluation - 04/19/22 0750       Goals   Current Weight 234 lb (106.1 kg)    Nutrition Goal Cut back on portions.    Comment Timmothy Sours states that he wants to lose weight but he eats to much. He usually eats at home. He states that he goes out sometimes. Informed him to use a smaller plate to help him with portion sizes.    Expected Outcome Short: cut back on portions. Long: maintain portion sizes independently.             Psychosocial: Target Goals: Acknowledge presence or absence of significant depression and/or stress, maximize coping skills, provide positive support system. Participant is able to verbalize types and ability to use techniques and skills needed for reducing stress and depression.   Education: Stress, Anxiety, and Depression - Group verbal and visual presentation to define topics covered.  Reviews how body is impacted by stress, anxiety, and depression.  Also discusses healthy ways to reduce stress and to treat/manage anxiety and depression.  Written material given at graduation. Flowsheet Row Cardiac Rehab from 05/01/2022 in [facility] Cardiac and Pulmonary Rehab  Education need identified 03/11/22  Date 04/03/22  Educator [facility]  Instruction Review Code 1- [employer] Understanding       Education: Sleep Hygiene -Provides group verbal and written instruction about how sleep can affect your  health.  Define sleep hygiene, discuss sleep cycles and impact of sleep habits. Review good sleep hygiene tips.    Initial Review & Psychosocial Screening:  Initial Psych Review & Screening - 02/28/22 0958       Initial Review   Current issues with None Identified      Family Dynamics   Good Support System? Yes    Comments Raiford has a good family support system, his brother love next door to him. His brother stays the night at his house at times. He has a postive outlook on his health.      Barriers   Psychosocial barriers to participate in program The patient should benefit from training in stress management and relaxation.;There are no identifiable barriers or psychosocial needs.      Screening Interventions   Interventions Encouraged to exercise;To provide support and resources with identified psychosocial needs;Provide feedback about the scores to participant;Program counselor consult    Expected Outcomes Short Term goal: Utilizing psychosocial counselor, staff and physician to assist with identification of specific Stressors or current issues interfering with healing process. Setting desired goal for each stressor or current issue identified.;Long Term Goal: Stressors or current issues are controlled or eliminated.;Short Term goal: Identification and review with participant of any Quality of Life or Depression concerns found by  scoring the questionnaire.;Long Term goal: The participant improves quality of Life and PHQ9 Scores as seen by post scores and/or verbalization of changes             Quality of Life Scores:   Quality of Life - 03/11/22 1227       Quality of Life   Select Quality of Life      Quality of Life Scores   Health/Function Pre 19.23 %    Socioeconomic Pre 25 %    Psych/Spiritual Pre 25.5 %    Family Pre 26 %    GLOBAL Pre 22.5 %            Scores of 19 and below usually indicate a poorer quality of life in these areas.  A difference of  2-3 points  is a clinically meaningful difference.  A difference of 2-3 points in the total score of the Quality of Life Index has been associated with significant improvement in overall quality of life, self-image, physical symptoms, and general health in studies assessing change in quality of life.  PHQ-9: Review Flowsheet       04/19/2022 03/11/2022  Depression screen PHQ 2/9  Decreased Interest 0 3  Down, Depressed, Hopeless 0 0  PHQ - 2 Score 0 3  Altered sleeping 0 2  Tired, decreased energy 1 3  Change in appetite 2 0  Feeling bad or failure about yourself  0 0  Trouble concentrating 0 2  Moving slowly or fidgety/restless 0 0  Suicidal thoughts 0 0  PHQ-9 Score 3 10  Difficult doing work/chores Somewhat difficult Somewhat difficult   Interpretation of Total Score  Total Score Depression Severity:  1-4 = Minimal depression, 5-9 = Mild depression, 10-14 = Moderate depression, 15-19 = Moderately severe depression, 20-27 = Severe depression   Psychosocial Evaluation and Intervention:  Psychosocial Evaluation - 02/28/22 1002       Psychosocial Evaluation & Interventions   Interventions Encouraged to exercise with the program and follow exercise prescription;Relaxation education;Stress management education    Comments Alferd has a good family support system, his brother love next door to him. His brother stays the night at his house at times. He has a postive outlook on his health.    Expected Outcomes Short: Start [facility] to help with mood. Long: Maintain a healthy mental state    Continue Psychosocial Services  Follow up required by staff             Psychosocial Re-Evaluation:  Psychosocial Re-Evaluation     Row Name 03/27/22 0757 04/19/22 0804           Psychosocial Re-Evaluation   Current issues with None Identified None Identified      Comments Timmothy Sours is doing well in rehab.  He is feeling good mentally and in a good place. He sleeps well.  He lives next to his brother  who helps care for him and drives him to appointments. We talked about being more active and walking more too. Reviewed patient health questionnaire (PHQ-9) with patient for follow up. Previously, patients score indicated signs/symptoms of depression.  Reviewed to see if patient is improving symptom wise while in program.  Score improved and patient states that it is because he has been able to exercise and enjoys it.      Expected Outcomes Short: Conitnue to exercise for mental boost Long; Continue to stay positive Short: Continue to attend [facility] regularly for regular exercise and social engagement. Long: Continue to improve  symptoms and manage a positive mental state.      Interventions Encouraged to attend Cardiac Rehabilitation for the exercise;Stress management education Encouraged to attend Cardiac Rehabilitation for the exercise      Continue Psychosocial Services  Follow up required by staff Follow up required by staff               Psychosocial Discharge (Final Psychosocial Re-Evaluation):  Psychosocial Re-Evaluation - 04/19/22 0804       Psychosocial Re-Evaluation   Current issues with None Identified    Comments Reviewed patient health questionnaire (PHQ-9) with patient for follow up. Previously, patients score indicated signs/symptoms of depression.  Reviewed to see if patient is improving symptom wise while in program.  Score improved and patient states that it is because he has been able to exercise and enjoys it.    Expected Outcomes Short: Continue to attend [facility] regularly for regular exercise and social engagement. Long: Continue to improve symptoms and manage a positive mental state.    Interventions Encouraged to attend Cardiac Rehabilitation for the exercise    Continue Psychosocial Services  Follow up required by staff             Vocational Rehabilitation: Provide vocational rehab assistance to qualifying candidates.   Vocational Rehab Evaluation &  Intervention:  Vocational Rehab - 03/11/22 1228       Initial Vocational Rehab Evaluation & Intervention   Assessment shows need for Vocational Rehabilitation No   retired            Education: Education Goals: Education classes will be provided on a variety of topics geared toward better understanding of heart health and risk factor modification. Participant will state understanding/return demonstration of topics presented as noted by education test scores.  Learning Barriers/Preferences:  Learning Barriers/Preferences - 02/28/22 0956       Learning Barriers/Preferences   Learning Barriers Hearing    Learning Preferences None             General Cardiac Education Topics:  AED/CPR: - Group verbal and written instruction with the use of models to demonstrate the basic use of the AED with the basic ABC's of resuscitation.   Anatomy and Cardiac Procedures: - Group verbal and visual presentation and models provide information about basic cardiac anatomy and function. Reviews the testing methods done to diagnose heart disease and the outcomes of the test results. Describes the treatment choices: Medical Management, Angioplasty, or Coronary Bypass Surgery for treating various heart conditions including Myocardial Infarction, Angina, Valve Disease, and Cardiac Arrhythmias.  Written material given at graduation. Flowsheet Row Cardiac Rehab from 05/01/2022 in [facility] Cardiac and Pulmonary Rehab  Education need identified 03/11/22       Medication Safety: - Group verbal and visual instruction to review commonly prescribed medications for heart and lung disease. Reviews the medication, class of the drug, and side effects. Includes the steps to properly store meds and maintain the prescription regimen.  Written material given at graduation. Flowsheet Row Cardiac Rehab from 05/01/2022 in [facility] Cardiac and Pulmonary Rehab  Date 03/13/22  Educator SB  Instruction Review Code 1-  Verbalizes Understanding       Intimacy: - Group verbal instruction through game format to discuss how heart and lung disease can affect sexual intimacy. Written material given at graduation.[place] Row Cardiac Rehab from 05/01/2022 in [facility] Cardiac and Pulmonary Rehab  Date 04/17/22  Educator [facility]  Instruction Review Code 1- Verbalizes Understanding       Know  Your Numbers and Heart Failure: - Group verbal and visual instruction to discuss disease risk factors for cardiac and pulmonary disease and treatment options.  Reviews associated critical values for Overweight/Obesity, Hypertension, Cholesterol, and Diabetes.  Discusses basics of heart failure: signs/symptoms and treatments.  Introduces Heart Failure Zone chart for action plan for heart failure.  Written material given at graduation. Flowsheet Row Cardiac Rehab from 05/01/2022 in [facility] Cardiac and Pulmonary Rehab  Education need identified 03/11/22  Date 03/20/22  Educator SB  Instruction Review Code 1- Verbalizes Understanding       Infection Prevention: - Provides verbal and written material to individual with discussion of infection control including proper hand washing and proper equipment cleaning during exercise session. Flowsheet Row Cardiac Rehab from 05/01/2022 in [facility] Cardiac and Pulmonary Rehab  Date 02/28/22  Educator [facility]  Instruction Review Code 1- Verbalizes Understanding       Falls Prevention: - Provides verbal and written material to individual with discussion of falls prevention and safety. Flowsheet Row Cardiac Rehab from 05/01/2022 in [facility] Cardiac and Pulmonary Rehab  Date 02/28/22  Educator [facility]  Instruction Review Code 1- Verbalizes Understanding       Other: -Provides group and verbal instruction on various topics (see comments)   Knowledge Questionnaire Score:  Knowledge Questionnaire Score - 03/11/22 1228       Knowledge Questionnaire Score   Pre Score 16/26             Core  Components/Risk Factors/Patient Goals at Admission:  Personal Goals and Risk Factors at Admission - 03/11/22 1228       Core Components/Risk Factors/Patient Goals on Admission    Weight Management Yes;Weight Loss    Intervention Weight Management: Develop a combined nutrition and exercise program designed to reach desired caloric intake, while maintaining appropriate intake of nutrient and fiber, sodium and fats, and appropriate energy expenditure required for the weight goal.;Weight Management: Provide education and appropriate resources to help participant work on and attain dietary goals.;Weight Management/Obesity: Establish reasonable short term and long term weight goals.    Admit Weight 227 lb 12.8 oz (103.3 kg)    Goal Weight: Short Term 222 lb (100.7 kg)    Goal Weight: Long Term 215 lb (97.5 kg)    Expected Outcomes Short Term: Continue to assess and modify interventions until short term weight is achieved;Long Term: Adherence to nutrition and physical activity/exercise program aimed toward attainment of established weight goal;Weight Loss: Understanding of general recommendations for a balanced deficit meal plan, which promotes 1-2 lb weight loss per week and includes a negative energy balance of [phone] kcal/d;Understanding recommendations for meals to include 15-35% energy as protein, 25-35% energy from fat, 35-60% energy from carbohydrates, less than 256m of dietary cholesterol, 20-35 gm of total fiber daily;Understanding of distribution of calorie intake throughout the day with the consumption of 4-5 meals/snacks    Diabetes Yes    Intervention Provide education about signs/symptoms and action to take for hypo/hyperglycemia.;Provide education about proper nutrition, including hydration, and aerobic/resistive exercise prescription along with prescribed medications to achieve blood glucose in normal ranges: Fasting glucose 65-99 mg/dL    Expected Outcomes Short Term: Participant verbalizes  understanding of the signs/symptoms and immediate care of hyper/hypoglycemia, proper foot care and importance of medication, aerobic/resistive exercise and nutrition plan for blood glucose control.;Long Term: Attainment of HbA1C < 7%.    Heart Failure Yes    Intervention Provide a combined exercise and nutrition program that is supplemented with education, support and  counseling about heart failure. Directed toward relieving symptoms such as shortness of breath, decreased exercise tolerance, and extremity edema.    Expected Outcomes Improve functional capacity of life;Short term: Attendance in program 2-3 days a week with increased exercise capacity. Reported lower sodium intake. Reported increased fruit and vegetable intake. Reports medication compliance.;Long term: Adoption of self-care skills and reduction of barriers for early signs and symptoms recognition and intervention leading to self-care maintenance.;Short term: Daily weights obtained and reported for increase. Utilizing diuretic protocols set by physician.    Hypertension Yes    Intervention Provide education on lifestyle modifcations including regular physical activity/exercise, weight management, moderate sodium restriction and increased consumption of fresh fruit, vegetables, and low fat dairy, alcohol moderation, and smoking cessation.;Monitor prescription use compliance.    Expected Outcomes Short Term: Continued assessment and intervention until BP is < 140/32m HG in hypertensive participants. < 130/88mHG in hypertensive participants with diabetes, heart failure or chronic kidney disease.;Long Term: Maintenance of blood pressure at goal levels.    Lipids Yes    Intervention Provide education and support for participant on nutrition & aerobic/resistive exercise along with prescribed medications to achieve LDL <70107mHDL >4m69m  Expected Outcomes Short Term: Participant states understanding of desired cholesterol values and is compliant  with medications prescribed. Participant is following exercise prescription and nutrition guidelines.;Long Term: Cholesterol controlled with medications as prescribed, with individualized exercise RX and with personalized nutrition plan. Value goals: LDL < 70mg56mL > 40 mg.             Education:Diabetes - Individual verbal and written instruction to review signs/symptoms of diabetes, desired ranges of glucose level fasting, after meals and with exercise. Acknowledge that pre and post exercise glucose checks will be done for 3 sessions at entry of program. [facility]  Date 02/28/22  Educator [facility] Code 1- Verbalizes Understanding       Core Components/Risk Factors/Patient Goals Review:   Goals and Risk Factor Review     Row Name 03/27/22 0800 04/19/22 0755           Core Components/Risk Factors/Patient Goals Review   Personal Goals Review Weight Management/Obesity;Heart Failure;Hypertension;Diabetes;Lipids Weight Management/Obesity;Hypertension      Review Don iTimmothy Soursoing well in rehab.  He is down on his weight with taking his fluid pill.  His leg swelling has also gone down and he feels less short of breath.  His sugars are doing well and he is checking them at home with his brother helping him.  His blood pressures are also doing well.  He notes that he has had some chest pain recently but has not told his doctor about them yet. Don wTimmothy Sourss to lose weight but has sinced gained weight since the start of the program. He was 227 pounds and is up to 234 pounds. He states that his portionsa re to big and wants to cut back. His blood pressure has been a little better since the start of the program. BP has gone down by about 10 points since the start of the program.      Expected Outcomes Short: Continue to work on weight loss and let doctor know about chest pain Long; Continue to monitor risk factors Short:  lose 5 pounds in the next couple weeks. Long: reach weight goal.               Core Components/Risk Factors/Patient Goals at  Discharge (Final Review):   Goals and Risk Factor Review - 04/19/22 0755       Core Components/Risk Factors/Patient Goals Review   Personal Goals Review Weight Management/Obesity;Hypertension    Review Timmothy Sours wants to lose weight but has sinced gained weight since the start of the program. He was 227 pounds and is up to 234 pounds. He states that his portionsa re to big and wants to cut back. His blood pressure has been a little better since the start of the program. BP has gone down by about 10 points since the start of the program.    Expected Outcomes Short: lose 5 pounds in the next couple weeks. Long: reach weight goal.             ITP Comments:  ITP Comments     Row Name 02/28/22 1024 03/11/22 1210 03/13/22 0651 03/13/22 0825 04/10/22 0908   ITP Comments Virtual Visit completed. Patient informed on EP and RD appointment and 6 Minute walk test. Patient also informed of patient health questionnaires on My Chart. Patient Verbalizes understanding. Visit diagnosis can be found in [facility] 12/10/2021. Completed 6MWT and gym orientation. Initial ITP created and sent for review to Dr. Emily Filbert, Medical Director. 30 Day review completed. Medical Director ITP review done, changes made as directed, and signed approval by Medical Director.   New to program First full day of exercise!  Patient was oriented to gym and equipment including functions, settings, policies, and procedures.  Patient's individual exercise prescription and treatment plan were reviewed.  All starting workloads were established based on the results of the 6 minute walk test done at initial orientation visit.  The plan for exercise progression was also introduced and progression will be customized based on patient's performance and goals. 30 Day review completed. Medical Director ITP review done, changes made  as directed, and signed approval by Medical Director.    [place] Name 05/08/22 0737           ITP Comments 30 Day review completed. Medical Director ITP review done, changes made as directed, and signed approval by Medical Director.                Comments:

## 2022-05-08 NOTE — Progress Notes (Signed)
Daily Session Note  Patient Details  Name: Shane Holloway MRN: [id number] Date of Birth: 02-Sep-1950 Referring Provider:   Flowsheet Row Cardiac Rehab from 03/11/2022 in [facility] Cardiac and Pulmonary Rehab  Referring Provider Bernell List MD       Encounter Date: 05/08/2022  Check In:  Session Check In - 05/08/22 0739       Check-In   Supervising physician immediately available to respond to emergencies See telemetry face sheet for immediately available ER MD    Location [facility]-Cardiac & Pulmonary Rehab    Staff Present Antionette Fairy, BS, Exercise Physiologist;Jessica [place], MA, RCEP, CCRP, Mindi Curling, RN, [place]    Virtual Visit No    Medication changes reported     No    Fall or balance concerns reported    No    Warm-up and Cool-down Performed on first and last piece of equipment    Resistance Training Performed Yes    VAD Patient? No    PAD/SET Patient? No      Pain Assessment   Currently in Pain? No/denies                Social History   Tobacco Use  Smoking Status Former   Packs/day: 0.25   Years: 4.00   Total pack years: 1.00   Types: Cigarettes   Quit date: 1995   Years since quitting: 28.9  Smokeless Tobacco Former   Types: Chew   Quit date: 1995    Goals Met:  Independence with exercise equipment Exercise tolerated well No report of concerns or symptoms today Strength training completed today  Goals Unmet:  Not Applicable  Comments: Pt able to follow exercise prescription today without complaint.  Will continue to monitor for progression.    Dr. Emily Filbert is Medical Director for [place].  Dr. Ottie Glazier is Medical Director for [facility] Pulmonary Rehabilitation.

## 2022-05-10 ENCOUNTER — Encounter: Payer: Medicare Other | Attending: Internal Medicine | Admitting: *Deleted

## 2022-05-10 DIAGNOSIS — Z5189 Encounter for other specified aftercare: Secondary | ICD-10-CM | POA: Insufficient documentation

## 2022-05-10 DIAGNOSIS — I252 Old myocardial infarction: Secondary | ICD-10-CM | POA: Diagnosis not present

## 2022-05-10 DIAGNOSIS — Z955 Presence of coronary angioplasty implant and graft: Secondary | ICD-10-CM | POA: Diagnosis not present

## 2022-05-10 DIAGNOSIS — I214 Non-ST elevation (NSTEMI) myocardial infarction: Secondary | ICD-10-CM

## 2022-05-10 NOTE — Progress Notes (Signed)
Daily Session Note  Patient Details  Name: Shane Holloway MRN: [id number] Date of Birth: Nov 30, 1950 Referring Provider:   Flowsheet Row Cardiac Rehab from 03/11/2022 in [facility] Cardiac and Pulmonary Rehab  Referring Provider Bernell List MD       Encounter Date: 05/10/2022  Check In:  Session Check In - 05/10/22 0818       Check-In   Supervising physician immediately available to respond to emergencies See telemetry face sheet for immediately available ER MD    Location [facility]-Cardiac & Pulmonary Rehab    Staff Present Alberteen Sam, MA, RCEP, CCRP, CCET;Joseph [place], RCP,RRT,BSRT;Other   Media Rhew RN   Virtual Visit No    Medication changes reported     No    Fall or balance concerns reported    No    Warm-up and Cool-down Performed on first and last piece of equipment    Resistance Training Performed Yes    VAD Patient? No    PAD/SET Patient? No      Pain Assessment   Currently in Pain? No/denies                Social History   Tobacco Use  Smoking Status Former   Packs/day: 0.25   Years: 4.00   Total pack years: 1.00   Types: Cigarettes   Quit date: 1995   Years since quitting: 28.9  Smokeless Tobacco Former   Types: Chew   Quit date: 1995    Goals Met:  Proper associated with RPD/PD & O2 Sat Independence with exercise equipment Exercise tolerated well No report of concerns or symptoms today Strength training completed today  Goals Unmet:  Not Applicable  Comments: Pt able to follow exercise prescription today without complaint.  Will continue to monitor for progression.    Dr. Emily Filbert is Medical Director for [place].  Dr. Ottie Glazier is Medical Director for [facility] Pulmonary Rehabilitation.

## 2022-05-13 ENCOUNTER — Encounter: Payer: Medicare Other | Admitting: *Deleted

## 2022-05-13 DIAGNOSIS — Z5189 Encounter for other specified aftercare: Secondary | ICD-10-CM | POA: Diagnosis not present

## 2022-05-13 DIAGNOSIS — I214 Non-ST elevation (NSTEMI) myocardial infarction: Secondary | ICD-10-CM

## 2022-05-13 NOTE — Progress Notes (Signed)
Daily Session Note  Patient Details  Name: Shane Holloway MRN: [id number] Date of Birth: [birth year] Referring Provider:   Flowsheet Row Cardiac Rehab from 03/11/2022 in [facility] Cardiac and Pulmonary Rehab  Referring Provider Bernell List MD       Encounter Date: 05/13/2022  Check In:  Session Check In - 05/13/22 0913       Check-In   Supervising physician immediately available to respond to emergencies See telemetry face sheet for immediately available ER MD    Location [facility]-Cardiac & Pulmonary Rehab    Staff Present Darlyne Russian, RN, Doyce Para, BS, ACSM CEP, Exercise Physiologist;Joseph Tessie Fass, [place]    Virtual Visit No    Medication changes reported     No    Fall or balance concerns reported    No    Warm-up and Cool-down Performed on first and last piece of equipment    Resistance Training Performed Yes    VAD Patient? No    PAD/SET Patient? No      Pain Assessment   Currently in Pain? No/denies                Social History   Tobacco Use  Smoking Status Former   Packs/day: 0.25   Years: 4.00   Total pack years: 1.00   Types: Cigarettes   Quit date: 1995   Years since quitting: 28.9  Smokeless Tobacco Former   Types: Chew   Quit date: 1995    Goals Met:  Independence with exercise equipment Exercise tolerated well No report of concerns or symptoms today Strength training completed today  Goals Unmet:  Not Applicable  Comments: Pt able to follow exercise prescription today without complaint.  Will continue to monitor for progression.    Dr. Emily Filbert is Medical Director for [place].  Dr. Ottie Glazier is Medical Director for [facility] Pulmonary Rehabilitation.

## 2022-05-15 ENCOUNTER — Encounter: Payer: Medicare Other | Admitting: *Deleted

## 2022-05-15 DIAGNOSIS — I214 Non-ST elevation (NSTEMI) myocardial infarction: Secondary | ICD-10-CM

## 2022-05-15 DIAGNOSIS — Z5189 Encounter for other specified aftercare: Secondary | ICD-10-CM | POA: Diagnosis not present

## 2022-05-15 DIAGNOSIS — Z955 Presence of coronary angioplasty implant and graft: Secondary | ICD-10-CM

## 2022-05-15 NOTE — Progress Notes (Signed)
Daily Session Note  Patient Details  Name: Shane Holloway MRN: [id number] Date of Birth: 1950-10-02 Referring Provider:   Flowsheet Row Cardiac Rehab from 03/11/2022 in [facility] Cardiac and Pulmonary Rehab  Referring Provider Bernell List MD       Encounter Date: 05/15/2022  Check In:  Session Check In - 05/15/22 1142       Check-In   Supervising physician immediately available to respond to emergencies See telemetry face sheet for immediately available ER MD    Location [facility]-Cardiac & Pulmonary Rehab    Staff Present Nyoka Cowden, RN, BSN, Lauretta Grill, RCP,RRT,BSRT;Noah Tickle, BS, Exercise Physiologist;Laureen Owens Shark, [place], RRT, CPFT    Virtual Visit No    Medication changes reported     No    Fall or balance concerns reported    No    Tobacco Cessation No Change    Warm-up and Cool-down Performed on first and last piece of equipment    Resistance Training Performed Yes    VAD Patient? No    PAD/SET Patient? No      Pain Assessment   Currently in Pain? No/denies                Social History   Tobacco Use  Smoking Status Former   Packs/day: 0.25   Years: 4.00   Total pack years: 1.00   Types: Cigarettes   Quit date: 1995   Years since quitting: 28.9  Smokeless Tobacco Former   Types: Chew   Quit date: 1995    Goals Met:  Independence with exercise equipment Exercise tolerated well No report of concerns or symptoms today  Goals Unmet:  Not Applicable  Comments: Pt able to follow exercise prescription today without complaint.  Will continue to monitor for progression.    Dr. Emily Filbert is Medical Director for [place].  Dr. Ottie Glazier is Medical Director for [facility] Pulmonary Rehabilitation.

## 2022-05-17 ENCOUNTER — Encounter: Payer: Medicare Other | Admitting: *Deleted

## 2022-05-17 VITALS — Ht 73.7 in | Wt 234.3 lb

## 2022-05-17 DIAGNOSIS — Z5189 Encounter for other specified aftercare: Secondary | ICD-10-CM | POA: Diagnosis not present

## 2022-05-17 DIAGNOSIS — Z955 Presence of coronary angioplasty implant and graft: Secondary | ICD-10-CM

## 2022-05-17 DIAGNOSIS — I214 Non-ST elevation (NSTEMI) myocardial infarction: Secondary | ICD-10-CM

## 2022-05-17 NOTE — Patient Instructions (Signed)
Discharge Patient Instructions  Patient Details  Name: Shane Holloway MRN: [id number] Date of Birth: Oct 11, 1950 Referring Provider:  Caryl Never, MD   Number of Visits: 67  Reason for Discharge:  Patient reached a stable level of exercise. Patient independent in their exercise. Patient has met program and personal goals.  Smoking History:  Social History   Tobacco Use  Smoking Status Former   Packs/day: 0.25   Years: 4.00   Total pack years: 1.00   Types: Cigarettes   Quit date: 1995   Years since quitting: 28.9  Smokeless Tobacco Former   Types: Chew   Quit date: 1995    Diagnosis:  NSTEMI (non-ST elevated myocardial infarction) (Bernice)  Status post coronary artery stent placement  Initial Exercise Prescription:  Initial Exercise Prescription - 03/11/22 1200       Date of Initial Exercise RX and Referring Provider   Date 03/11/22    Referring Provider Bernell List MD      Oxygen   Maintain Oxygen Saturation 88% or higher      Treadmill   MPH 1.5    Grade 0    Minutes 15    METs 2.15      NuStep   Level 2    SPM 80    Minutes 15    METs 2      Track   Laps 22    Minutes 15    METs 2.2      Prescription Details   Frequency (times per week) 3    Duration Progress to 30 minutes of continuous aerobic without signs/symptoms of physical distress      Intensity   THRR 40-80% of Max Heartrate 107-134    Ratings of Perceived Exertion 11-13    Perceived Dyspnea 0-4      Progression   Progression Continue to progress workloads to maintain intensity without signs/symptoms of physical distress.      Resistance Training   Training Prescription Yes    Weight 4 lb    Reps 10-15             Discharge Exercise Prescription (Final Exercise Prescription Changes):  Exercise Prescription Changes - 05/06/22 1100       Response to Exercise   Blood Pressure (Admit) 134/80    Blood Pressure (Exit) 144/82    Heart Rate (Admit) 73 bpm     Heart Rate (Exercise) 84 bpm    Heart Rate (Exit) 75 bpm    Rating of Perceived Exertion (Exercise) 13    Symptoms none    Duration Continue with 30 min of aerobic exercise without signs/symptoms of physical distress.    Intensity THRR unchanged      Progression   Progression Continue to progress workloads to maintain intensity without signs/symptoms of physical distress.    Average METs 1.81      Resistance Training   Training Prescription Yes    Weight 4 lb    Reps 10-15      Interval Training   Interval Training No      NuStep   Level 2    Minutes 15    METs 1.6      T5 Nustep   Level 1    Minutes 15      Track   Laps 16    Minutes 15    METs 1.87      Home Exercise Plan   Plans to continue exercise at Home (comment)   walking, treadmill  Frequency Add 2 additional days to program exercise sessions.    Initial Home Exercises Provided 03/27/22      Oxygen   Maintain Oxygen Saturation 88% or higher             Functional Capacity:  6 Minute Walk     Row Name 03/11/22 1211 05/17/22 0825       6 Minute Walk   Phase Initial Discharge    Distance 860 feet 1076 feet    Distance % Change -- 25.1 %    Distance Feet Change -- 216 ft    Walk Time 6 minutes 6 minutes    # of Rest Breaks 0 0    MPH 1.63 2.04    METS 1.85 2.47    RPE 9 121    Perceived Dyspnea  1 --    VO2 Peak 6.47 8.63    Symptoms Yes (comment) No    Comments SOB, leg pain 5/10 --    Resting HR 80 bpm 61 bpm    Resting BP 114/62 124/74    Resting Oxygen Saturation  96 % 95 %    Exercise Oxygen Saturation  during 6 min walk 96 % 96 %    Max Ex. HR 95 bpm 113 bpm    Max Ex. BP 126/74 142/76    2 Minute Post BP 122/70 --             Nutrition & Weight - Outcomes:  Pre Biometrics - 03/11/22 1225       Pre Biometrics   Height 6' 1.7" (1.872 m)    Weight 227 lb 12.8 oz (103.3 kg)    BMI (Calculated) 29.49    Single Leg Stand 1.21 seconds             Post Biometrics -  05/17/22 0826        Post  Biometrics   Height 6' 1.7" (1.872 m)    Weight 234 lb 4.8 oz (106.3 kg)    BMI (Calculated) 30.33    Single Leg Stand 0 seconds             Nutrition:  Nutrition Therapy & Goals - 03/11/22 1148       Nutrition Therapy   Diet Heart healthy, low Na, T2DM friendly    Protein (specify units) 85-90g    Fiber 30 grams    Whole Grain Foods 3 servings    Saturated Fats 17 max. grams    Fruits and Vegetables 8 servings/day    Sodium 1.5 grams      Personal Nutrition Goals   Nutrition Goal ST: include more fiber with meals - fruits/vegetables, whole grains. Limit sodium <1.5g/day LT: Manage A1C < 7, limited saturated fat <17g/day, limit sodium < 1.5g/day, include at least 30g of fiber/day.    Comments 71 y.o. M admitted to cardiac rehab s/p NSTEMI. PMHx includes CAD, CVA (earlier this year), cognitive impairment, HTN, T2DM (A1C 7.8), HLD, HFrEF, SVT, Aortic Stenosis, CKD stg 3. PSHx includes CABG x3 (2009), CABG x2 (2015). Relevant medications includes lipitor, glipizide, vit D3, jardiance. Shane Holloway reports that he has had increased swelling for a while now and recently his weight had gone up (he reports this was a sudden weight gain >5 pounds); highly encouraged him to speak to his MD regarding this as this is likely fluid retention especially as he has history with CKD stg 3 and HFrEF. Discussed limiting sodium to <1590m as this can help with fluid build-up as  well; he reports using many canned and pre-prepared food items which can have a lot of sodium and well as how to build easy meals (sandwiches, eggs with toast and berries, microwaved grains with baked chicken and frozen vegetables) - reviewed label reading and provided handout. Shane Holloway brother lives next door and he reports that his brother will help him go food shopping and cook; his brother was with him during his nutrition consultation today as well. Shane Holloway had questions about eggs; discussed how eggs can be  a part of a heart healthy diet, but encouaged him to balance this meal with whole wheat toast and/or some fruit like berries. Discussed heart healthy eating, sodium, and T2DM friendly eating. Cay Schillings to practice MyPlate and see what he can add to meals such as fiber rich foods to meals.      Intervention Plan   Intervention Prescribe, educate and counsel regarding individualized specific dietary modifications aiming towards targeted core components such as weight, hypertension, lipid management, diabetes, heart failure and other comorbidities.;Nutrition handout(s) given to patient.    Expected Outcomes Short Term Goal: Understand basic principles of dietary content, such as calories, fat, sodium, cholesterol and nutrients.;Short Term Goal: A plan has been developed with personal nutrition goals set during dietitian appointment.;Long Term Goal: Adherence to prescribed nutrition plan.            Goals reviewed with patient; copy given to patient.

## 2022-05-17 NOTE — Progress Notes (Signed)
Daily Session Note  Patient Details  Name: Shane Holloway MRN: [id number] Date of Birth: Mar 20, 1951 Referring Provider:   Flowsheet Row Cardiac Rehab from 03/11/2022 in [facility] Cardiac and Pulmonary Rehab  Referring Provider Bernell List MD       Encounter Date: 05/17/2022  Check In:  Session Check In - 05/17/22 0816       Check-In   Supervising physician immediately available to respond to emergencies See telemetry face sheet for immediately available ER MD    Location [facility]-Cardiac & Pulmonary Rehab    Staff Present Heath Lark, RN, BSN, CCRP;Jessica [place], MA, RCEP, CCRP, CCET;Joseph [place], [place]    Virtual Visit No    Medication changes reported     No    Fall or balance concerns reported    No    Warm-up and Cool-down Performed on first and last piece of equipment    Resistance Training Performed Yes    VAD Patient? No    PAD/SET Patient? No      Pain Assessment   Currently in Pain? No/denies                Social History   Tobacco Use  Smoking Status Former   Packs/day: 0.25   Years: 4.00   Total pack years: 1.00   Types: Cigarettes   Quit date: 1995   Years since quitting: 28.9  Smokeless Tobacco Former   Types: Chew   Quit date: 1995    Goals Met:  Independence with exercise equipment Exercise tolerated well No report of concerns or symptoms today  Goals Unmet:  Not Applicable  Comments: Pt able to follow exercise prescription today without complaint.  Will continue to monitor for progression.   [place] Name 03/11/22 1211 05/17/22 0825       6 Minute Walk   Phase Initial Discharge    Distance 860 feet 1076 feet    Distance % Change -- 25.1 %    Distance Feet Change -- 216 ft    Walk Time 6 minutes 6 minutes    # of Rest Breaks 0 0    MPH 1.63 2.04    METS 1.85 2.47    RPE 9 121    Perceived Dyspnea  1 --    VO2 Peak 6.47 8.63    Symptoms Yes (comment) No    Comments SOB, leg pain 5/10 --    Resting HR  80 bpm 61 bpm    Resting BP 114/62 124/74    Resting Oxygen Saturation  96 % 95 %    Exercise Oxygen Saturation  during 6 min walk 96 % 96 %    Max Ex. HR 95 bpm 113 bpm    Max Ex. BP 126/74 142/76    2 Minute Post BP 122/70 --              Dr. Emily Filbert is Medical Director for [place].  Dr. Ottie Glazier is Medical Director for [facility] Pulmonary Rehabilitation.

## 2022-05-20 ENCOUNTER — Encounter: Payer: Medicare Other | Admitting: *Deleted

## 2022-05-20 DIAGNOSIS — I214 Non-ST elevation (NSTEMI) myocardial infarction: Secondary | ICD-10-CM

## 2022-05-20 DIAGNOSIS — Z5189 Encounter for other specified aftercare: Secondary | ICD-10-CM | POA: Diagnosis not present

## 2022-05-20 NOTE — Progress Notes (Signed)
Daily Session Note  Patient Details  Name: Shane Holloway MRN: [id number] Date of Birth: 1950/09/28 Referring Provider:   Flowsheet Row Cardiac Rehab from 03/11/2022 in [facility] Cardiac and Pulmonary Rehab  Referring Provider Bernell List MD       Encounter Date: 05/20/2022  Check In:  Session Check In - 05/20/22 0802       Check-In   Supervising physician immediately available to respond to emergencies See telemetry face sheet for immediately available ER MD    Location [facility]-Cardiac & Pulmonary Rehab    Staff Present Earlean Shawl, BS, ACSM CEP, Exercise Physiologist;Hubbert Landrigan Tamala Julian, RN, Terie Purser, RCP,RRT,BSRT    Virtual Visit No    Medication changes reported     No    Fall or balance concerns reported    No    Warm-up and Cool-down Performed on first and last piece of equipment    Resistance Training Performed Yes    VAD Patient? No    PAD/SET Patient? No      Pain Assessment   Currently in Pain? No/denies                Social History   Tobacco Use  Smoking Status Former   Packs/day: 0.25   Years: 4.00   Total pack years: 1.00   Types: Cigarettes   Quit date: 1995   Years since quitting: 28.9  Smokeless Tobacco Former   Types: Chew   Quit date: 1995    Goals Met:  Independence with exercise equipment Exercise tolerated well No report of concerns or symptoms today Strength training completed today  Goals Unmet:  Not Applicable  Comments: Pt able to follow exercise prescription today without complaint.  Will continue to monitor for progression.    Dr. Emily Filbert is Medical Director for [place].  Dr. Ottie Glazier is Medical Director for [facility] Pulmonary Rehabilitation.

## 2022-05-22 ENCOUNTER — Encounter: Payer: Medicare Other | Admitting: *Deleted

## 2022-05-22 DIAGNOSIS — I214 Non-ST elevation (NSTEMI) myocardial infarction: Secondary | ICD-10-CM

## 2022-05-22 DIAGNOSIS — Z5189 Encounter for other specified aftercare: Secondary | ICD-10-CM | POA: Diagnosis not present

## 2022-05-22 NOTE — Progress Notes (Signed)
Daily Session Note  Patient Details  Name: Shane Holloway MRN: [id number] Date of Birth: [birth year] Referring Provider:   Flowsheet Row Cardiac Rehab from 03/11/2022 in [facility] Cardiac and Pulmonary Rehab  Referring Provider Bernell List MD       Encounter Date: 05/22/2022  Check In:  Session Check In - 05/22/22 0744       Check-In   Supervising physician immediately available to respond to emergencies See telemetry face sheet for immediately available ER MD    Location [facility]-Cardiac & Pulmonary Rehab    Staff Present Justin Mend, RCP,RRT,BSRT;Melissa [place], RDN, Shireen Quan, RN, ADN    Virtual Visit No    Medication changes reported     No    Fall or balance concerns reported    No    Warm-up and Cool-down Performed on first and last piece of equipment    Resistance Training Performed Yes    VAD Patient? No    PAD/SET Patient? No      Pain Assessment   Currently in Pain? No/denies                Social History   Tobacco Use  Smoking Status Former   Packs/day: 0.25   Years: 4.00   Total pack years: 1.00   Types: Cigarettes   Quit date: 1995   Years since quitting: 28.9  Smokeless Tobacco Former   Types: Chew   Quit date: 1995    Goals Met:  Independence with exercise equipment Exercise tolerated well No report of concerns or symptoms today Strength training completed today  Goals Unmet:  Not Applicable  Comments: Pt able to follow exercise prescription today without complaint.  Will continue to monitor for progression.    Dr. Emily Filbert is Medical Director for [place].  Dr. Ottie Glazier is Medical Director for [facility] Pulmonary Rehabilitation.

## 2022-05-27 ENCOUNTER — Encounter: Payer: Medicare Other | Admitting: *Deleted

## 2022-05-27 DIAGNOSIS — Z5189 Encounter for other specified aftercare: Secondary | ICD-10-CM | POA: Diagnosis not present

## 2022-05-27 DIAGNOSIS — I214 Non-ST elevation (NSTEMI) myocardial infarction: Secondary | ICD-10-CM

## 2022-05-27 NOTE — Progress Notes (Signed)
Daily Session Note  Patient Details  Name: Shane Holloway MRN: [id number] Date of Birth: June 20, 1950 Referring Provider:   Flowsheet Row Cardiac Rehab from 03/11/2022 in [facility] Cardiac and Pulmonary Rehab  Referring Provider Bernell List MD       Encounter Date: 05/27/2022  Check In:  Session Check In - 05/27/22 0807       Check-In   Supervising physician immediately available to respond to emergencies See telemetry face sheet for immediately available ER MD    Location [facility]-Cardiac & Pulmonary Rehab    Staff Present Darlyne Russian, RN, Doyce Para, BS, ACSM CEP, Exercise Physiologist;Joseph Tessie Fass, [place]    Virtual Visit No    Medication changes reported     No    Fall or balance concerns reported    No    Warm-up and Cool-down Performed on first and last piece of equipment    Resistance Training Performed Yes    VAD Patient? No    PAD/SET Patient? No      Pain Assessment   Currently in Pain? No/denies                Social History   Tobacco Use  Smoking Status Former   Packs/day: 0.25   Years: 4.00   Total pack years: 1.00   Types: Cigarettes   Quit date: 1995   Years since quitting: 28.9  Smokeless Tobacco Former   Types: Chew   Quit date: 1995    Goals Met:  Independence with exercise equipment Exercise tolerated well No report of concerns or symptoms today Strength training completed today  Goals Unmet:  Not Applicable  Comments: Pt able to follow exercise prescription today without complaint.  Will continue to monitor for progression.    Dr. Emily Filbert is Medical Director for [place].  Dr. Ottie Glazier is Medical Director for [place] Pulmonary Rehabilitation.

## 2022-05-29 ENCOUNTER — Encounter: Payer: Medicare Other | Admitting: *Deleted

## 2022-05-29 DIAGNOSIS — I214 Non-ST elevation (NSTEMI) myocardial infarction: Secondary | ICD-10-CM

## 2022-05-29 DIAGNOSIS — Z5189 Encounter for other specified aftercare: Secondary | ICD-10-CM | POA: Diagnosis not present

## 2022-05-29 NOTE — Progress Notes (Signed)
Daily Session Note  Patient Details  Name: Shane Holloway MRN: [id number] Date of Birth: July 31, 1950 Referring Provider:   Flowsheet Row Cardiac Rehab from 03/11/2022 in [facility] Cardiac and Pulmonary Rehab  Referring Provider Bernell List MD       Encounter Date: 05/29/2022  Check In:  Session Check In - 05/29/22 0809       Check-In   Supervising physician immediately available to respond to emergencies See telemetry face sheet for immediately available ER MD    Location [facility]-Cardiac & Pulmonary Rehab    Staff Present Heath Lark, RN, BSN, CCRP;Joseph [place], RCP,RRT,BSRT;Noah Tickle, [place], Exercise Physiologist    Virtual Visit No    Medication changes reported     No    Fall or balance concerns reported    No    Warm-up and Cool-down Performed on first and last piece of equipment    Resistance Training Performed Yes    VAD Patient? No    PAD/SET Patient? No      Pain Assessment   Currently in Pain? No/denies                Social History   Tobacco Use  Smoking Status Former   Packs/day: 0.25   Years: 4.00   Total pack years: 1.00   Types: Cigarettes   Quit date: 1995   Years since quitting: 28.9  Smokeless Tobacco Former   Types: Chew   Quit date: 1995    Goals Met:  Independence with exercise equipment Exercise tolerated well No report of concerns or symptoms today  Goals Unmet:  Not Applicable  Comments: Pt able to follow exercise prescription today without complaint.  Will continue to monitor for progression.    Dr. Emily Filbert is Medical Director for [place].  Dr. Ottie Glazier is Medical Director for [facility] Pulmonary Rehabilitation.

## 2022-06-05 ENCOUNTER — Encounter: Payer: Self-pay | Admitting: *Deleted

## 2022-06-05 DIAGNOSIS — I214 Non-ST elevation (NSTEMI) myocardial infarction: Secondary | ICD-10-CM

## 2022-06-05 NOTE — Progress Notes (Signed)
Cardiac Individual Treatment Plan  Patient Details  Name: Shane Holloway MRN: [id number] Date of Birth: Oct 23, 1950 Referring Provider:   Flowsheet Row Cardiac Rehab from 03/11/2022 in [facility] Cardiac and Pulmonary Rehab  Referring Provider Bernell List MD       Initial Encounter Date:  Flowsheet Row Cardiac Rehab from 03/11/2022 in [facility] Cardiac and Pulmonary Rehab  Date 03/11/22       Visit Diagnosis: NSTEMI (non-ST elevated myocardial infarction) [facility])  Patient's Home Medications on Admission:  Current Outpatient Medications:    amiodarone (PACERONE) 200 MG tablet, Take 1 tablet (200 mg total) by mouth daily., Disp: 30 tablet, Rfl: 0   amiodarone (PACERONE) 200 MG tablet, Take 1 tablet by mouth daily., Disp: , Rfl:    amLODipine (NORVASC) 2.5 MG tablet, Take 2.5 mg by mouth daily. (Patient not taking: Reported on 02/28/2022), Disp: , Rfl:    apixaban (ELIQUIS) 5 MG TABS tablet, Take by mouth., Disp: , Rfl:    atorvastatin (LIPITOR) 80 MG tablet, Take 80 mg by mouth daily., Disp: , Rfl:    benzonatate (TESSALON PERLES) 100 MG capsule, Take 1 capsule (100 mg total) by mouth 3 (three) times daily as needed for cough. (Patient not taking: Reported on 02/28/2022), Disp: 20 capsule, Rfl: 0   Cholecalciferol 125 MCG (5000 UT) TABS, Take 1 tablet by mouth daily., Disp: , Rfl:    cyclobenzaprine (FLEXERIL) 5 MG tablet, Take 5 mg by mouth 2 (two) times daily. (Patient not taking: Reported on 02/28/2022), Disp: , Rfl:    ELIQUIS 5 MG TABS tablet, Take 5 mg by mouth 2 (two) times daily., Disp: , Rfl:    empagliflozin (JARDIANCE) 10 MG TABS tablet, Take 1 tablet by mouth daily., Disp: , Rfl:    finasteride (PROSCAR) 5 MG tablet, Take 5 mg by mouth daily. (Patient not taking: Reported on 02/28/2022), Disp: , Rfl:    furosemide (LASIX) 20 MG tablet, Take 1 tablet (20 mg total) by mouth daily., Disp: 30 tablet, Rfl: 3   glipiZIDE (GLUCOTROL XL) 10 MG 24 hr tablet, Take 10 mg by mouth daily., Disp: ,  Rfl:    glipiZIDE (GLUCOTROL XL) 10 MG 24 hr tablet, Take 1 tablet by mouth daily. (Patient not taking: Reported on 02/28/2022), Disp: , Rfl:    JARDIANCE 10 MG TABS tablet, Take 10 mg by mouth daily., Disp: , Rfl:    losartan (COZAAR) 100 MG tablet, Take 100 mg by mouth daily. (Patient not taking: Reported on 02/28/2022), Disp: , Rfl:    losartan (COZAAR) 100 MG tablet, Take 1 tablet by mouth daily., Disp: , Rfl:    metoprolol tartrate (LOPRESSOR) 50 MG tablet, Take 1 tablet (50 mg total) by mouth 2 (two) times daily. (Patient not taking: Reported on 02/28/2022), Disp: 60 tablet, Rfl: 0   metoprolol tartrate (LOPRESSOR) 50 MG tablet, Take by mouth., Disp: , Rfl:    nitroGLYCERIN (NITROSTAT) 0.4 MG SL tablet, Place under the tongue., Disp: , Rfl:    Tamsulosin HCl (FLOMAX) 0.4 MG CAPS, TAKE ONE CAPSULE BY MOUTH EVERY DAY (Patient not taking: Reported on 02/28/2022), Disp: 30 capsule, Rfl: 2  Past Medical History: Past Medical History:  Diagnosis Date   Allergy    Aortic stenosis    Arthritis    hands   BPH (benign prostatic hyperplasia)    CHF (congestive heart failure) (HCC)    CKD (chronic kidney disease)    Diabetes mellitus    Dyspnea    Hyperlipidemia    Hypertension  Myocardial infarction ([place]) 06/10/2002   Stroke ([place]) 09/2020   Some memory deficits   Vertigo    Wears dentures    full upper    Tobacco Use: Social History   Tobacco Use  Smoking Status Former   Packs/day: 0.25   Years: 4.00   Total pack years: 1.00   Types: Cigarettes   Quit date: 1995   Years since quitting: 29.0  Smokeless Tobacco Former   Types: Chew   Quit date: 1995    Labs: Review Flowsheet       Latest Ref Rng & Units 07/27/2007 07/29/2007 08/10/2011 12/11/2021  Labs for ITP Cardiac and Pulmonary Rehab  Cholestrol 0 - 200 mg/dL - - 178  171   LDL (calc) 0 - 99 mg/dL - - 117  UNABLE TO CALCULATE IF TRIGLYCERIDE OVER 400 mg/dL   Direct LDL 0 - 99 mg/dL - - - 58.1   HDL-C >40 mg/dL - - 35  28    Trlycerides <150 mg/dL - - 130  509   Hemoglobin A1c 4.8 - 5.6 % 7.2 (NOTE)   The ADA recommends the following therapeutic goals for glycemic   control related to Hgb A1C measurement:   Goal of Therapy:   < 7.0% Hgb A1C   Action Suggested:  > 8.0% Hgb A1C   Ref:  Diabetes Care, 22, Suppl. 1, 1999  - 7.3  8.9   PH, Arterial - 7.423  7.380  7.363  7.391  7.351  - -  PCO2 arterial - 38.6  37.7  40.2  39.9  49.2  - -  Bicarbonate - 24.8  22.3  22.6  24.4  27.2  - -  TCO2 - 26.0  _0 - -  Acid-base deficit - - 3.0  2.0  1.0  - -  O2 Saturation - 96.9  95.0  91.0  100.0  - -     Exercise Target Goals: Exercise Program Goal: Individual exercise prescription set using results from initial 6 min walk test and THRR while considering  patient's activity barriers and safety.   Exercise Prescription Goal: Initial exercise prescription builds to 30-45 minutes a day of aerobic activity, 2-3 days per week.  Home exercise guidelines will be given to patient during program as part of exercise prescription that the participant will acknowledge.   Education: Aerobic Exercise: - Group verbal and visual presentation on the components of exercise prescription. Introduces F.I.T.T principle from ACSM for exercise prescriptions.  Reviews F.I.T.T. principles of aerobic exercise including progression. Written material given at graduation. Flowsheet Row Cardiac Rehab from 05/08/2022 in [facility] Cardiac and Pulmonary Rehab  Education need identified 03/11/22  Date 04/17/22  Educator [facility]  Instruction Review Code 1- Verbalizes Understanding       Education: Resistance Exercise: - Group verbal and visual presentation on the components of exercise prescription. Introduces F.I.T.T principle from ACSM for exercise prescriptions  Reviews F.I.T.T. principles of resistance exercise including progression. Written material given at graduation.    Education: Exercise & Equipment Safety: - Individual verbal  instruction and demonstration of equipment use and safety with use of the equipment. Flowsheet Row Cardiac Rehab from 05/08/2022 in [facility] Cardiac and Pulmonary Rehab  Date 02/28/22  Educator [facility]  Instruction Review Code 1- Verbalizes Understanding       Education: Exercise Physiology & General Exercise Guidelines: - Group verbal and written instruction with models to review the exercise physiology of the cardiovascular system and associated critical  values. Provides general exercise guidelines with specific guidelines to those with heart or lung disease.    Education: Flexibility, Balance, Mind/Body Relaxation: - Group verbal and visual presentation with interactive activity on the components of exercise prescription. Introduces F.I.T.T principle from ACSM for exercise prescriptions. Reviews F.I.T.T. principles of flexibility and balance exercise training including progression. Also discusses the mind body connection.  Reviews various relaxation techniques to help reduce and manage stress (i.e. Deep breathing, progressive muscle relaxation, and visualization). Balance handout provided to take home. Written material given at graduation.   Activity Barriers & Risk Stratification:  Activity Barriers & Cardiac Risk Stratification - 03/11/22 1222       Activity Barriers & Cardiac Risk Stratification   Activity Barriers Deconditioning;Muscular Weakness;Balance Concerns;History of Falls;Shortness of Breath;Other (comment);Assistive Device    Comments cognitive impairment, Hx CVA    Cardiac Risk Stratification High             6 Minute Walk:  6 Minute Walk     Row Name 03/11/22 1211 05/17/22 0825       6 Minute Walk   Phase Initial Discharge    Distance 860 feet 1076 feet    Distance % Change -- 25.1 %    Distance Feet Change -- 216 ft    Walk Time 6 minutes 6 minutes    # of Rest Breaks 0 0    MPH 1.63 2.04    METS 1.85 2.47    RPE 9 121    Perceived Dyspnea  1 --    VO2 Peak  6.47 8.63    Symptoms Yes (comment) No    Comments SOB, leg pain 5/10 --    Resting HR 80 bpm 61 bpm    Resting BP 114/62 124/74    Resting Oxygen Saturation  96 % 95 %    Exercise Oxygen Saturation  during 6 min walk 96 % 96 %    Max Ex. HR 95 bpm 113 bpm    Max Ex. BP 126/74 142/76    2 Minute Post BP 122/70 --             Oxygen Initial Assessment:   Oxygen Re-Evaluation:   Oxygen Discharge (Final Oxygen Re-Evaluation):   Initial Exercise Prescription:  Initial Exercise Prescription - 03/11/22 1200       Date of Initial Exercise RX and Referring Provider   Date 03/11/22    Referring Provider Bernell List MD      Oxygen   Maintain Oxygen Saturation 88% or higher      Treadmill   MPH 1.5    Grade 0    Minutes 15    METs 2.15      NuStep   Level 2    SPM 80    Minutes 15    METs 2      Track   Laps 22    Minutes 15    METs 2.2      Prescription Details   Frequency (times per week) 3    Duration Progress to 30 minutes of continuous aerobic without signs/symptoms of physical distress      Intensity   THRR 40-80% of Max Heartrate 107-134    Ratings of Perceived Exertion 11-13    Perceived Dyspnea 0-4      Progression   Progression Continue to progress workloads to maintain intensity without signs/symptoms of physical distress.      Resistance Training   Training Prescription Yes    Weight 4  lb    Reps 10-15             Perform Capillary Blood Glucose checks as needed.  Exercise Prescription Changes:   Exercise Prescription Changes     Row Name 03/11/22 1200 03/27/22 0700 04/08/22 1000 04/22/22 1400 05/06/22 1100     Response to Exercise   Blood Pressure (Admit) 114/62 -- 128/76 118/70 134/80   Blood Pressure (Exercise) 126/74 -- 132/62 158/82 --   Blood Pressure (Exit) 122/70 -- 130/82 120/80 144/82   Heart Rate (Admit) 80 bpm -- 76 bpm 70 bpm 73 bpm   Heart Rate (Exercise) 95 bpm -- 98 bpm 95 bpm 84 bpm   Heart Rate (Exit) 80  bpm -- 72 bpm 77 bpm 75 bpm   Oxygen Saturation (Admit) 96 % -- -- -- --   Oxygen Saturation (Exercise) 96 % -- -- -- --   Rating of Perceived Exertion (Exercise) 9 -- _0 Perceived Dyspnea (Exercise) 1 -- -- -- --   Symptoms SOB, leg pain 5/10 -- none none none   Comments walk test results -- 3rd full week of exercise -- --   Duration -- -- Progress to 30 minutes of  aerobic without signs/symptoms of physical distress Progress to 30 minutes of  aerobic without signs/symptoms of physical distress Continue with 30 min of aerobic exercise without signs/symptoms of physical distress.   Intensity -- -- THRR unchanged THRR unchanged THRR unchanged     Progression   Progression -- -- Continue to progress workloads to maintain intensity without signs/symptoms of physical distress. Continue to progress workloads to maintain intensity without signs/symptoms of physical distress. Continue to progress workloads to maintain intensity without signs/symptoms of physical distress.   Average METs -- -- 1.92 1.85 1.81     Resistance Training   Training Prescription -- -- Yes Yes Yes   Weight -- -- 4 lb 4 lb 4 lb   Reps -- -- 10-15 10-15 10-15     Interval Training   Interval Training -- -- No No No     Recumbant Bike   Level -- -- -- 2.8 --   Minutes -- -- -- 15 --   METs -- -- -- 2.4 --     NuStep   Level -- -- _1 Minutes -- -- _2 METs -- -- 2 2.1 1.6     T5 Nustep   Level -- -- -- -- 1   Minutes -- -- -- -- 15     Biostep-RELP   Level -- -- 2 -- --   Minutes -- -- 15 -- --   METs -- -- 2 -- --     Track   Laps -- -- _3 Minutes -- -- _4 METs -- -- 2.31 1.82 1.87     Home Exercise Plan   Plans to continue exercise at -- Home (comment)  walking, treadmill Home (comment)  walking, treadmill Home (comment)  walking, treadmill Home (comment)  walking, treadmill   Frequency -- Add 2 additional days to program exercise sessions. Add 2 additional days to  program exercise sessions. Add 2 additional days to program exercise sessions. Add 2 additional days to program exercise sessions.   Initial Home Exercises Provided -- 03/27/22 03/27/22 03/27/22 03/27/22     Oxygen   Maintain Oxygen Saturation -- -- 88% or higher 88% or higher 88% or higher  [place] Name 05/20/22 1400             Response to Exercise   Blood Pressure (Admit) 124/74       Blood Pressure (Exit) 132/64       Heart Rate (Admit) 61 bpm       Heart Rate (Exercise) 113 bpm       Heart Rate (Exit) 63 bpm       Rating of Perceived Exertion (Exercise) 14       Symptoms none       Duration Continue with 30 min of aerobic exercise without signs/symptoms of physical distress.       Intensity THRR unchanged         Progression   Progression Continue to progress workloads to maintain intensity without signs/symptoms of physical distress.       Average METs 2.02         Resistance Training   Training Prescription Yes       Weight 4 lb       Reps 10-15         Interval Training   Interval Training No         NuStep   Level 4       Minutes 30       METs 2.5         Track   Laps 17       Minutes 15       METs 1.92         Home Exercise Plan   Plans to continue exercise at Home (comment)  walking, treadmill       Frequency Add 2 additional days to program exercise sessions.       Initial Home Exercises Provided 03/27/22         Oxygen   Maintain Oxygen Saturation 88% or higher                Exercise Comments:   Exercise Comments     Row Name 03/13/22 0825           Exercise Comments First full day of exercise!  Patient was oriented to gym and equipment including functions, settings, policies, and procedures.  Patient's individual exercise prescription and treatment plan were reviewed.  All starting workloads were established based on the results of the 6 minute walk test done at initial orientation visit.  The plan for exercise progression was also  introduced and progression will be customized based on patient's performance and goals.                Exercise Goals and Review:   Exercise Goals     Row Name 03/11/22 1225             Exercise Goals   Increase Physical Activity Yes       Intervention Develop an individualized exercise prescription for aerobic and resistive training based on initial evaluation findings, risk stratification, comorbidities and participant's personal goals.;Provide advice, education, support and counseling about physical activity/exercise needs.       Expected Outcomes Short Term: Attend rehab on a regular basis to increase amount of physical activity.;Long Term: Add in home exercise to make exercise part of routine and to increase amount of physical activity.;Long Term: Exercising regularly at least 3-5 days a week.       Increase Strength and Stamina Yes       Intervention Provide advice, education, support and counseling about physical activity/exercise needs.;Develop an individualized  exercise prescription for aerobic and resistive training based on initial evaluation findings, risk stratification, comorbidities and participant's personal goals.       Expected Outcomes Short Term: Increase workloads from initial exercise prescription for resistance, speed, and METs.;Short Term: Perform resistance training exercises routinely during rehab and add in resistance training at home;Long Term: Improve cardiorespiratory fitness, muscular endurance and strength as measured by increased METs and functional capacity (6MWT)       Able to understand and use rate of perceived exertion (RPE) scale Yes       Intervention Provide education and explanation on how to use RPE scale       Expected Outcomes Short Term: Able to use RPE daily in rehab to express subjective intensity level;Long Term:  Able to use RPE to guide intensity level when exercising independently       Able to understand and use Dyspnea scale Yes        Intervention Provide education and explanation on how to use Dyspnea scale       Expected Outcomes Short Term: Able to use Dyspnea scale daily in rehab to express subjective sense of shortness of breath during exertion;Long Term: Able to use Dyspnea scale to guide intensity level when exercising independently       Knowledge and understanding of Target Heart Rate Range (THRR) Yes       Intervention Provide education and explanation of THRR including how the numbers were predicted and where they are located for reference       Expected Outcomes Short Term: Able to state/look up THRR;Short Term: Able to use daily as guideline for intensity in rehab;Long Term: Able to use THRR to govern intensity when exercising independently       Able to check pulse independently Yes       Intervention Provide education and demonstration on how to check pulse in carotid and radial arteries.;Review the importance of being able to check your own pulse for safety during independent exercise       Expected Outcomes Short Term: Able to explain why pulse checking is important during independent exercise;Long Term: Able to check pulse independently and accurately       Understanding of Exercise Prescription Yes       Intervention Provide education, explanation, and written materials on patient's individual exercise prescription       Expected Outcomes Short Term: Able to explain program exercise prescription;Long Term: Able to explain home exercise prescription to exercise independently                Exercise Goals Re-Evaluation :  Exercise Goals Re-Evaluation     Row Name 03/13/22 0826 03/27/22 0756 04/08/22 1049 04/19/22 0747 04/22/22 1441     Exercise Goal Re-Evaluation   Exercise Goals Review Able to understand and use rate of perceived exertion (RPE) scale;Able to understand and use Dyspnea scale;Knowledge and understanding of Target Heart Rate Range (THRR);Understanding of Exercise Prescription Able to  understand and use rate of perceived exertion (RPE) scale;Able to understand and use Dyspnea scale;Knowledge and understanding of Target Heart Rate Range (THRR);Understanding of Exercise Prescription;Increase Strength and Stamina;Increase Physical Activity;Able to check pulse independently Increase Physical Activity;Increase Strength and Stamina;Understanding of Exercise Prescription Increase Physical Activity;Increase Strength and Stamina;Understanding of Exercise Prescription Increase Physical Activity;Increase Strength and Stamina;Understanding of Exercise Prescription   Comments Reviewed RPE and dyspnea scales, THR and program prescription with pt today.  Pt voiced understanding and was given a copy of goals to take home. Reviewed  home exercise with pt today.  Pt plans to walk and use brother's treadmill for exercise.  Reviewed THR, pulse, RPE, sign and symptoms, pulse oximetery and when to call 911 or MD.  Also discussed weather considerations and indoor options.  Pt voiced understanding.  He is already starting to notice a difference in his stamina. Timmothy Sours is doing well for the first couple weeks he has been here. He has so far been able to walk 24 laps on the track and is now up to level 4 on the T4 Nustep. RPEs are staying in appropriate range. We will continue to monitor. Timmothy Sours is trying to improve his levels in the program. He wants to workout at the [name] when he is done with the program. His brother takes him to alot of appointments and he would like to workout with his brother. Timmothy Sours is doing well in the program. He has consistently worked at a MET level above 1.85 METs. He also started using the recumbent bike and has done well at level 2.8. His laps on the track did go down to 15 so we will encourage him to push for more laps. We will continue to monitor his progress in the program.   Expected Outcomes Short: Use RPE daily to regulate intensity. Long: Follow program prescription in THR. Short: Add in  exercise on off days Long: continue to exercise indpendently Short: Continue to increase laps on track Long: Continue to increase overall MET level Short: start going to the [facility]. Long: maintain working out independently. Short: Continue to push for more laps on the track. Long: Continue to increase strength and stamina.    [place] Name 05/06/22 1145 05/17/22 0826 05/20/22 1411         Exercise Goal Re-Evaluation   Exercise Goals Review Increase Physical Activity;Increase Strength and Stamina;Understanding of Exercise Prescription Increase Physical Activity;Increase Strength and Stamina;Understanding of Exercise Prescription Increase Physical Activity;Increase Strength and Stamina;Understanding of Exercise Prescription     Comments Timmothy Sours continues to do well in the program. although he is limited right now by his back pain. He has not been able to walk as many laps on the track as before. He is now using a pillow to help with lumbar support when on the seated machines. He also went down to level 2 on the T4 Nustep, staff will remind patient to keep level up and if not, increase beyond that if able. He is not quite hitting his THR. Will continue to monitor. Don improved his post 6MWT by 25%!!  He is planning to join the [facility] after graduation just after Christmas.  He is pleased with the progress he is making and that he is feeling better.  His stamina has greatly improved as he is able to exercise the whole time now!! Timmothy Sours is doing well in rehab and is close to graduating. He improved on his post 6MWT by 25%! He also was able to walk up 17 laps on the track and he tolerated the T4 for 30 minutes during multiple other sessions. He improved his overall average MET level as well to 2.02 METs. We will continue to monitor his progress until he graduates from the program.     Expected Outcomes Short: Increase laps on track tolerated by back, increase seated machine levels back up. Continue to exercise independently  Short: Graduate. Long: Continue to exercise independently.              Discharge Exercise Prescription (Final Exercise Prescription Changes):  Exercise  Prescription Changes - 05/20/22 1400       Response to Exercise   Blood Pressure (Admit) 124/74    Blood Pressure (Exit) 132/64    Heart Rate (Admit) 61 bpm    Heart Rate (Exercise) 113 bpm    Heart Rate (Exit) 63 bpm    Rating of Perceived Exertion (Exercise) 14    Symptoms none    Duration Continue with 30 min of aerobic exercise without signs/symptoms of physical distress.    Intensity THRR unchanged      Progression   Progression Continue to progress workloads to maintain intensity without signs/symptoms of physical distress.    Average METs 2.02      Resistance Training   Training Prescription Yes    Weight 4 lb    Reps 10-15      Interval Training   Interval Training No      NuStep   Level 4    Minutes 30    METs 2.5      Track   Laps 17    Minutes 15    METs 1.92      Home Exercise Plan   Plans to continue exercise at Home (comment)   walking, treadmill   Frequency Add 2 additional days to program exercise sessions.    Initial Home Exercises Provided 03/27/22      Oxygen   Maintain Oxygen Saturation 88% or higher             Nutrition:  Target Goals: Understanding of nutrition guidelines, daily intake of sodium <1581m, cholesterol <2070m calories 30% from fat and 7% or less from saturated fats, daily to have 5 or more servings of fruits and vegetables.  Education: All About Nutrition: -Group instruction provided by verbal, written material, interactive activities, discussions, models, and posters to present general guidelines for heart healthy nutrition including fat, fiber, MyPlate, the role of sodium in heart healthy nutrition, utilization of the nutrition label, and utilization of this knowledge for meal planning. Follow up email sent as well. Written material given at graduation. Flowsheet  [facility]  Date 05/08/22  [facility]- Verbalizes Understanding       Biometrics:  Pre Biometrics - 03/11/22 1225       Pre Biometrics   Height 6' 1.7" (1.872 m)    Weight 227 lb 12.8 oz (103.3 kg)    BMI (Calculated) 29.49    Single Leg Stand 1.21 seconds             Post Biometrics - 05/17/22 0826        Post  Biometrics   Height 6' 1.7" (1.872 m)    Weight 234 lb 4.8 oz (106.3 kg)    BMI (Calculated) 30.33    Single Leg Stand 0 seconds             Nutrition Therapy Plan and Nutrition Goals:  Nutrition Therapy & Goals - 03/11/22 1148       Nutrition Therapy   Diet Heart healthy, low Na, T2DM friendly    Protein (specify units) 85-90g    Fiber 30 grams    Whole Grain Foods 3 servings    Saturated Fats 17 max. grams    Fruits and Vegetables 8 servings/day    Sodium 1.5 grams      Personal Nutrition Goals   Nutrition Goal ST: include more fiber with meals - fruits/vegetables, whole grains. Limit sodium <  1.5g/day LT: Manage A1C < 7, limited saturated fat <17g/day, limit sodium < 1.5g/day, include at least 30g of fiber/day.    Comments 71 y.o. M admitted to cardiac rehab s/p NSTEMI. PMHx includes CAD, CVA (earlier this year), cognitive impairment, HTN, T2DM (A1C 7.8), HLD, HFrEF, SVT, Aortic Stenosis, CKD stg 3. PSHx includes CABG x3 (2009), CABG x2 (2015). Relevant medications includes lipitor, glipizide, vit D3, jardiance. Ashraf reports that he has had increased swelling for a while now and recently his weight had gone up (he reports this was a sudden weight gain >5 pounds); highly encouraged him to speak to his MD regarding this as this is likely fluid retention especially as he has history with CKD stg 3 and HFrEF. Discussed limiting sodium to <1538m as this can help with fluid build-up as well; he reports using many canned and pre-prepared food items which can have a lot of  sodium and well as how to build easy meals (sandwiches, eggs with toast and berries, microwaved grains with baked chicken and frozen vegetables) - reviewed label reading and provided handout. Donalds brother lives next door and he reports that his brother will help him go food shopping and cook; his brother was with him during his nutrition consultation today as well. DOlneyhad questions about eggs; discussed how eggs can be a part of a heart healthy diet, but encouaged him to balance this meal with whole wheat toast and/or some fruit like berries. Discussed heart healthy eating, sodium, and T2DM friendly eating. ECay Schillingsto practice MyPlate and see what he can add to meals such as fiber rich foods to meals.      Intervention Plan   Intervention Prescribe, educate and counsel regarding individualized specific dietary modifications aiming towards targeted core components such as weight, hypertension, lipid management, diabetes, heart failure and other comorbidities.;Nutrition handout(s) given to patient.    Expected Outcomes Short Term Goal: Understand basic principles of dietary content, such as calories, fat, sodium, cholesterol and nutrients.;Short Term Goal: A plan has been developed with personal nutrition goals set during dietitian appointment.;Long Term Goal: Adherence to prescribed nutrition plan.             Nutrition Assessments:  MEDIFICTS Score Key: ?70 Need to make dietary changes  40-70 Heart Healthy Diet ? 40 Therapeutic Level Cholesterol Diet  [facility]  Picture Your Plate Total Score on Discharge 45      Picture Your Plate Scores: <<73Unhealthy dietary pattern with much room for improvement. 41-50 Dietary pattern unlikely to meet recommendations for good health and room for improvement. 51-60 More healthful dietary pattern, with some room for improvement.  >60 Healthy dietary pattern, although  there may be some specific behaviors that could be improved.    Nutrition Goals Re-Evaluation:  Nutrition Goals Re-Evaluation     [place] 03/27/22 0759 04/19/22 0750 05/17/22 0828         Goals   Current Weight -- 234 lb (106.1 kg) --     Nutrition Goal ST: include more fiber with meals - fruits/vegetables, whole grains. Limit sodium <1.5g/day LT: Manage A1C < 7, limited saturated fat <17g/day, limit sodium < 1.5g/day, include at least 30g of fiber/day. Cut back on portions. Short: cut back on portions. Long: maintain portion sizes independently.     Comment DTimmothy Soursis doing well in rehab. He is doing well with his diet. He is trying to get in more fiber with more  fruits and vegetables.  He has noticed that his stomach has already started to go down.  He is also taking his fluid pill. He is also watching his salt intake as well. Timmothy Sours states that he wants to lose weight but he eats to much. He usually eats at home. He states that he goes out sometimes. Informed him to use a smaller plate to help him with portion sizes. Timmothy Sours continues to work on his portion sizes, but his appetite is back now that he is feeling better.  He continues to work on balance     Expected Outcome Short: Conitnue to add variety Long; Continue to improve diet Short: cut back on portions. Long: maintain portion sizes independently. Continue to follow heart healhty eating              Nutrition Goals Discharge (Final Nutrition Goals Re-Evaluation):  Nutrition Goals Re-Evaluation - 05/17/22 0240       Goals   Nutrition Goal Short: cut back on portions. Long: maintain portion sizes independently.    Comment Timmothy Sours continues to work on his portion sizes, but his appetite is back now that he is feeling better.  He continues to work on balance    Expected Outcome Continue to follow heart healhty eating             Psychosocial: Target Goals: Acknowledge presence or absence of significant depression and/or stress, maximize  coping skills, provide positive support system. Participant is able to verbalize types and ability to use techniques and skills needed for reducing stress and depression.   Education: Stress, Anxiety, and Depression - Group verbal and visual presentation to define topics covered.  Reviews how body is impacted by stress, anxiety, and depression.  Also discusses healthy ways to reduce stress and to treat/manage anxiety and depression.  Written material given at graduation. Flowsheet Row Cardiac Rehab from 05/08/2022 in [facility] Cardiac and Pulmonary Rehab  Education need identified 03/11/22  Date 04/03/22  Educator [facility]  Instruction Review Code 1- [employer] Understanding       Education: Sleep Hygiene -Provides group verbal and written instruction about how sleep can affect your health.  Define sleep hygiene, discuss sleep cycles and impact of sleep habits. Review good sleep hygiene tips.    Initial Review & Psychosocial Screening:  Initial Psych Review & Screening - 02/28/22 0958       Initial Review   Current issues with None Identified      Family Dynamics   Good Support System? Yes    Comments Arjun has a good family support system, his brother love next door to him. His brother stays the night at his house at times. He has a postive outlook on his health.      Barriers   Psychosocial barriers to participate in program The patient should benefit from training in stress management and relaxation.;There are no identifiable barriers or psychosocial needs.      Screening Interventions   Interventions Encouraged to exercise;To provide support and resources with identified psychosocial needs;Provide feedback about the scores to participant;Program counselor consult    Expected Outcomes Short Term goal: Utilizing psychosocial counselor, staff and physician to assist with identification of specific Stressors or current issues interfering with healing process. Setting desired goal for each  stressor or current issue identified.;Long Term Goal: Stressors or current issues are controlled or eliminated.;Short Term goal: Identification and review with participant of any Quality of Life or Depression concerns found by scoring the questionnaire.;Long Term goal: The  participant improves quality of Life and PHQ9 Scores as seen by post scores and/or verbalization of changes             Quality of Life Scores:   Quality of Life - 05/20/22 1109       Quality of Life   Select Quality of Life      Quality of Life Scores   Health/Function Pre 19.23 %    Health/Function Post 10 %    Health/Function % Change -48 %    Socioeconomic Pre 25 %    Socioeconomic Post 21.43 %    Socioeconomic % Change  -14.28 %    Psych/Spiritual Pre 25.5 %    Psych/Spiritual Post 22 %    Psych/Spiritual % Change -13.73 %    Family Pre 26 %    Family Post 30 %    Family % Change 15.38 %    GLOBAL Pre 22.5 %    GLOBAL Post 16.84 %    GLOBAL % Change -25.16 %            Scores of 19 and below usually indicate a poorer quality of life in these areas.  A difference of  2-3 points is a clinically meaningful difference.  A difference of 2-3 points in the total score of the Quality of Life Index has been associated with significant improvement in overall quality of life, self-image, physical symptoms, and general health in studies assessing change in quality of life.  PHQ-9: Review Flowsheet       05/20/2022 04/19/2022 03/11/2022  Depression screen PHQ 2/9  Decreased Interest 3 0 3  Down, Depressed, Hopeless 3 0 0  PHQ - 2 Score 6 0 3  Altered sleeping 3 0 2  Tired, decreased energy _0 Change in appetite 3 2 0  Feeling bad or failure about yourself  0 0 0  Trouble concentrating 3 0 2  Moving slowly or fidgety/restless 3 0 0  Suicidal thoughts 0 0 0  PHQ-9 Score _1 Difficult doing work/chores Not difficult at all Somewhat difficult Somewhat difficult   Interpretation of Total Score   Total Score Depression Severity:  1-4 = Minimal depression, 5-9 = Mild depression, 10-14 = Moderate depression, 15-19 = Moderately severe depression, 20-27 = Severe depression   Psychosocial Evaluation and Intervention:  Psychosocial Evaluation - 02/28/22 1002       Psychosocial Evaluation & Interventions   Interventions Encouraged to exercise with the program and follow exercise prescription;Relaxation education;Stress management education    Comments Clarnce has a good family support system, his brother love next door to him. His brother stays the night at his house at times. He has a postive outlook on his health.    Expected Outcomes Short: Start [facility] to help with mood. Long: Maintain a healthy mental state    Continue Psychosocial Services  Follow up required by staff             Psychosocial Re-Evaluation:  Psychosocial Re-Evaluation     Row Name 03/27/22 0757 04/19/22 0804 05/17/22 0827         Psychosocial Re-Evaluation   Current issues with None Identified None Identified None Identified     Comments Timmothy Sours is doing well in rehab.  He is feeling good mentally and in a good place. He sleeps well.  He lives next to his brother who helps care for him and drives him to appointments. We talked about being more active and walking  more too. Reviewed patient health questionnaire (PHQ-9) with patient for follow up. Previously, patients score indicated signs/symptoms of depression.  Reviewed to see if patient is improving symptom wise while in program.  Score improved and patient states that it is because he has been able to exercise and enjoys it. Timmothy Sours has done well in rehab. He improved his post 6WMT and has already made plans to join the [employer] with his brother after graduation.  He feels better overall and is staying positive.  He feels better to be moving again.     Expected Outcomes Short: Conitnue to exercise for mental boost Long; Continue to stay positive Short: Continue to  attend [facility] regularly for regular exercise and social engagement. Long: Continue to improve symptoms and manage a positive mental state. Continue to exercise for mental boost and strength     Interventions Encouraged to attend Cardiac Rehabilitation for the exercise;Stress management education Encouraged to attend Cardiac Rehabilitation for the exercise Encouraged to attend Cardiac Rehabilitation for the exercise     Continue Psychosocial Services  Follow up required by staff Follow up required by staff Follow up required by staff              Psychosocial Discharge (Final Psychosocial Re-Evaluation):  Psychosocial Re-Evaluation - 05/17/22 0827       Psychosocial Re-Evaluation   Current issues with None Identified    Comments Timmothy Sours has done well in rehab. He improved his post 6WMT and has already made plans to join the [employer] with his brother after graduation.  He feels better overall and is staying positive.  He feels better to be moving again.    Expected Outcomes Continue to exercise for mental boost and strength    Interventions Encouraged to attend Cardiac Rehabilitation for the exercise    Continue Psychosocial Services  Follow up required by staff             Vocational Rehabilitation: Provide vocational rehab assistance to qualifying candidates.   Vocational Rehab Evaluation & Intervention:  Vocational Rehab - 03/11/22 1228       Initial Vocational Rehab Evaluation & Intervention   Assessment shows need for Vocational Rehabilitation No   retired            Education: Education Goals: Education classes will be provided on a variety of topics geared toward better understanding of heart health and risk factor modification. Participant will state understanding/return demonstration of topics presented as noted by education test scores.  Learning Barriers/Preferences:  Learning Barriers/Preferences - 02/28/22 0956       Learning Barriers/Preferences   Learning  Barriers Hearing    Learning Preferences None             General Cardiac Education Topics:  AED/CPR: - Group verbal and written instruction with the use of models to demonstrate the basic use of the AED with the basic ABC's of resuscitation.   Anatomy and Cardiac Procedures: - Group verbal and visual presentation and models provide information about basic cardiac anatomy and function. Reviews the testing methods done to diagnose heart disease and the outcomes of the test results. Describes the treatment choices: Medical Management, Angioplasty, or Coronary Bypass Surgery for treating various heart conditions including Myocardial Infarction, Angina, Valve Disease, and Cardiac Arrhythmias.  Written material given at graduation. Flowsheet Row Cardiac Rehab from 05/22/2022 in [facility] Cardiac and Pulmonary Rehab  Date 05/15/22  Educator [facility]  Instruction Review Code 1- Verbalizes Understanding       Medication Safety: -  Group verbal and visual instruction to review commonly prescribed medications for heart and lung disease. Reviews the medication, class of the drug, and side effects. Includes the steps to properly store meds and maintain the prescription regimen.  Written material given at graduation. Flowsheet Row Cardiac Rehab from 05/22/2022 in [facility] Cardiac and Pulmonary Rehab  Date 05/22/22  Educator SB  Instruction Review Code 1- Verbalizes Understanding       Intimacy: - Group verbal instruction through game format to discuss how heart and lung disease can affect sexual intimacy. Written material given at graduation.. Flowsheet Row Cardiac Rehab from 05/08/2022 in [facility] Cardiac and Pulmonary Rehab  Date 04/17/22  Educator [facility]  Instruction Review Code 1- Verbalizes Understanding       Know Your Numbers and Heart Failure: - Group verbal and visual instruction to discuss disease risk factors for cardiac and pulmonary disease and treatment options.  Reviews associated critical  values for Overweight/Obesity, Hypertension, Cholesterol, and Diabetes.  Discusses basics of heart failure: signs/symptoms and treatments.  Introduces Heart Failure Zone chart for action plan for heart failure.  Written material given at graduation. Flowsheet Row Cardiac Rehab from 05/08/2022 in [facility] Cardiac and Pulmonary Rehab  Education need identified 03/11/22  Date 03/20/22  Educator SB  Instruction Review Code 1- Verbalizes Understanding       Infection Prevention: - Provides verbal and written material to individual with discussion of infection control including proper hand washing and proper equipment cleaning during exercise session. Flowsheet Row Cardiac Rehab from 05/08/2022 in [facility] Cardiac and Pulmonary Rehab  Date 02/28/22  Educator [facility]  Instruction Review Code 1- Verbalizes Understanding       Falls Prevention: - Provides verbal and written material to individual with discussion of falls prevention and safety. Flowsheet Row Cardiac Rehab from 05/08/2022 in [facility] Cardiac and Pulmonary Rehab  Date 02/28/22  Educator [facility]  Instruction Review Code 1- Verbalizes Understanding       Other: -Provides group and verbal instruction on various topics (see comments)   Knowledge Questionnaire Score:  Knowledge Questionnaire Score - 05/20/22 1109       Knowledge Questionnaire Score   Post Score 23/26             Core Components/Risk Factors/Patient Goals at Admission:  Personal Goals and Risk Factors at Admission - 03/11/22 1228       Core Components/Risk Factors/Patient Goals on Admission    Weight Management Yes;Weight Loss    Intervention Weight Management: Develop a combined nutrition and exercise program designed to reach desired caloric intake, while maintaining appropriate intake of nutrient and fiber, sodium and fats, and appropriate energy expenditure required for the weight goal.;Weight Management: Provide education and appropriate resources to help  participant work on and attain dietary goals.;Weight Management/Obesity: Establish reasonable short term and long term weight goals.    Admit Weight 227 lb 12.8 oz (103.3 kg)    Goal Weight: Short Term 222 lb (100.7 kg)    Goal Weight: Long Term 215 lb (97.5 kg)    Expected Outcomes Short Term: Continue to assess and modify interventions until short term weight is achieved;Long Term: Adherence to nutrition and physical activity/exercise program aimed toward attainment of established weight goal;Weight Loss: Understanding of general recommendations for a balanced deficit meal plan, which promotes 1-2 lb weight loss per week and includes a negative energy balance of [phone] kcal/d;Understanding recommendations for meals to include 15-35% energy as protein, 25-35% energy from fat, 35-60% energy from carbohydrates, less than 268m of  dietary cholesterol, 20-35 gm of total fiber daily;Understanding of distribution of calorie intake throughout the day with the consumption of 4-5 meals/snacks    Diabetes Yes    Intervention Provide education about signs/symptoms and action to take for hypo/hyperglycemia.;Provide education about proper nutrition, including hydration, and aerobic/resistive exercise prescription along with prescribed medications to achieve blood glucose in normal ranges: Fasting glucose 65-99 mg/dL    Expected Outcomes Short Term: Participant verbalizes understanding of the signs/symptoms and immediate care of hyper/hypoglycemia, proper foot care and importance of medication, aerobic/resistive exercise and nutrition plan for blood glucose control.;Long Term: Attainment of HbA1C < 7%.    Heart Failure Yes    Intervention Provide a combined exercise and nutrition program that is supplemented with education, support and counseling about heart failure. Directed toward relieving symptoms such as shortness of breath, decreased exercise tolerance, and extremity edema.    Expected Outcomes Improve  functional capacity of life;Short term: Attendance in program 2-3 days a week with increased exercise capacity. Reported lower sodium intake. Reported increased fruit and vegetable intake. Reports medication compliance.;Long term: Adoption of self-care skills and reduction of barriers for early signs and symptoms recognition and intervention leading to self-care maintenance.;Short term: Daily weights obtained and reported for increase. Utilizing diuretic protocols set by physician.    Hypertension Yes    Intervention Provide education on lifestyle modifcations including regular physical activity/exercise, weight management, moderate sodium restriction and increased consumption of fresh fruit, vegetables, and low fat dairy, alcohol moderation, and smoking cessation.;Monitor prescription use compliance.    Expected Outcomes Short Term: Continued assessment and intervention until BP is < 140/43m HG in hypertensive participants. < 130/868mHG in hypertensive participants with diabetes, heart failure or chronic kidney disease.;Long Term: Maintenance of blood pressure at goal levels.    Lipids Yes    Intervention Provide education and support for participant on nutrition & aerobic/resistive exercise along with prescribed medications to achieve LDL <7066mHDL >76m14m  Expected Outcomes Short Term: Participant states understanding of desired cholesterol values and is compliant with medications prescribed. Participant is following exercise prescription and nutrition guidelines.;Long Term: Cholesterol controlled with medications as prescribed, with individualized exercise RX and with personalized nutrition plan. Value goals: LDL < 70mg42mL > 40 mg.             Education:Diabetes - Individual verbal and written instruction to review signs/symptoms of diabetes, desired ranges of glucose level fasting, after meals and with exercise. Acknowledge that pre and post exercise glucose checks will be done for 3  sessions at entry of program. [facility]  Date 02/28/22  Educator JH  [facility] Review Code 1- Verbalizes Understanding       Core Components/Risk Factors/Patient Goals Review:   Goals and Risk Factor Review     Row Name 03/27/22 0800 04/19/22 0755 05/17/22 0833         Core Components/Risk Factors/Patient Goals Review   Personal Goals Review Weight Management/Obesity;Heart Failure;Hypertension;Diabetes;Lipids Weight Management/Obesity;Hypertension Weight Management/Obesity;Hypertension;Diabetes     Review Don iTimmothy Soursoing well in rehab.  He is down on his weight with taking his fluid pill.  His leg swelling has also gone down and he feels less short of breath.  His sugars are doing well and he is checking them at home with his brother helping him.  His blood pressures are also doing well.  He notes that he has had some chest pain recently but has not told  his doctor about them yet. Timmothy Sours wants to lose weight but has sinced gained weight since the start of the program. He was 227 pounds and is up to 234 pounds. He states that his portionsa re to big and wants to cut back. His blood pressure has been a little better since the start of the program. BP has gone down by about 10 points since the start of the program. Timmothy Sours has done well in rehab. His weight is steady now.  His pressues are doing well and he continues to montior them at home too.  His sugars are doing well and he has not had any major swings.     Expected Outcomes Short: Continue to work on weight loss and let doctor know about chest pain Long; Continue to monitor risk factors Short: lose 5 pounds in the next couple weeks. Long: reach weight goal. Continue to montior risk factors              Core Components/Risk Factors/Patient Goals at Discharge (Final Review):   Goals and Risk Factor Review - 05/17/22 0833       Core Components/Risk Factors/Patient Goals  Review   Personal Goals Review Weight Management/Obesity;Hypertension;Diabetes    Review Timmothy Sours has done well in rehab. His weight is steady now.  His pressues are doing well and he continues to montior them at home too.  His sugars are doing well and he has not had any major swings.    Expected Outcomes Continue to montior risk factors             ITP Comments:  ITP Comments     Row Name 02/28/22 1024 03/11/22 1210 03/13/22 0651 03/13/22 0825 04/10/22 0908   ITP Comments Virtual Visit completed. Patient informed on EP and RD appointment and 6 Minute walk test. Patient also informed of patient health questionnaires on My Chart. Patient Verbalizes understanding. Visit diagnosis [facility] be found in [facility] 12/10/[facility]21. Completed 6MWT and gym orientation. Initial ITP created and sent for review to Dr. Emily Filbert, Medical Director. 30 Day review completed. Medical Director ITP review done, changes made as directed, and signed approval by Medical Director.   New to program First full day of exercise!  Patient was oriented to gym and equipment including functions, settings, policies, and procedures.  Patient's individual exercise prescription and treatment plan were reviewed.  All starting workloads were established based on the results of the 6 minute walk test done at initial orientation visit.  The plan for exercise progression was also introduced and progression will be customized based on patient's performance and goals. 30 Day review completed. Medical Director ITP review done, changes made as directed, and signed approval by Medical Director.    [place] Name 05/08/22 0737 06/05/22 0947         ITP Comments 30 Day review completed. Medical Director ITP review done, changes made as directed, and signed approval by Medical Director. 30 Day review completed. Medical Director ITP review done, changes made as directed, and signed approval by Medical Director.               Comments:

## 2022-06-06 ENCOUNTER — Other Ambulatory Visit: Payer: Self-pay

## 2022-06-06 ENCOUNTER — Emergency Department (HOSPITAL_COMMUNITY): Payer: Medicare Other

## 2022-06-06 ENCOUNTER — Emergency Department (HOSPITAL_COMMUNITY)
Admission: EM | Admit: 2022-06-06 | Discharge: 2022-06-06 | Disposition: A | Discharge status: Home / Self Care | Payer: Medicare Other | Attending: Emergency Medicine | Admitting: Emergency Medicine

## 2022-06-06 DIAGNOSIS — I951 Orthostatic hypotension: Secondary | ICD-10-CM | POA: Diagnosis not present

## 2022-06-06 DIAGNOSIS — Z79899 Other long term (current) drug therapy: Secondary | ICD-10-CM | POA: Insufficient documentation

## 2022-06-06 DIAGNOSIS — N189 Chronic kidney disease, unspecified: Secondary | ICD-10-CM | POA: Diagnosis not present

## 2022-06-06 DIAGNOSIS — Z7982 Long term (current) use of aspirin: Secondary | ICD-10-CM | POA: Insufficient documentation

## 2022-06-06 DIAGNOSIS — I509 Heart failure, unspecified: Secondary | ICD-10-CM | POA: Diagnosis not present

## 2022-06-06 DIAGNOSIS — I130 Hypertensive heart and chronic kidney disease with heart failure and stage 1 through stage 4 chronic kidney disease, or unspecified chronic kidney disease: Secondary | ICD-10-CM | POA: Diagnosis not present

## 2022-06-06 DIAGNOSIS — Z1152 Encounter for screening for COVID-19: Secondary | ICD-10-CM | POA: Diagnosis not present

## 2022-06-06 DIAGNOSIS — R42 Dizziness and giddiness: Secondary | ICD-10-CM | POA: Diagnosis present

## 2022-06-06 DIAGNOSIS — F0390 Unspecified dementia without behavioral disturbance: Secondary | ICD-10-CM | POA: Diagnosis not present

## 2022-06-06 DIAGNOSIS — Z7901 Long term (current) use of anticoagulants: Secondary | ICD-10-CM | POA: Diagnosis not present

## 2022-06-06 DIAGNOSIS — I2510 Atherosclerotic heart disease of native coronary artery without angina pectoris: Secondary | ICD-10-CM | POA: Insufficient documentation

## 2022-06-06 DIAGNOSIS — Z8673 Personal history of transient ischemic attack (TIA), and cerebral infarction without residual deficits: Secondary | ICD-10-CM | POA: Diagnosis not present

## 2022-06-06 LAB — TROPONIN I (HIGH SENSITIVITY)
Troponin I (High Sensitivity): 22 ng/L — ABNORMAL HIGH (ref <18)
Troponin I (High Sensitivity): 18 ng/L — ABNORMAL HIGH (ref <18)

## 2022-06-06 LAB — CBC
HCT: 47.1 % (ref 39.0–52.0)
Hemoglobin: 15.9 g/dL (ref 13.0–17.0)
MCH: 29.1 pg (ref 26.0–34.0)
MCHC: 33.8 g/dL (ref 30.0–36.0)
MCV: 86.1 fL (ref 80.0–100.0)
Platelets: 131 10*3/uL — ABNORMAL LOW (ref 150–400)
RBC: 5.47 MIL/uL (ref 4.22–5.81)
RDW: 13.9 % (ref 11.5–15.5)
WBC: 6.7 10*3/uL (ref 4.0–10.5)
nRBC: 0 % (ref 0.0–0.2)

## 2022-06-06 LAB — BASIC METABOLIC PANEL
Anion gap: 11 (ref 5–15)
BUN: 30 mg/dL — ABNORMAL HIGH (ref 8–23)
CO2: 21 mmol/L — ABNORMAL LOW (ref 22–32)
Calcium: 8.9 mg/dL (ref 8.9–10.3)
Chloride: 107 mmol/L (ref 98–111)
Creatinine, Ser: 2.83 mg/dL — ABNORMAL HIGH (ref 0.61–1.24)
GFR, Estimated: 23 mL/min — ABNORMAL LOW (ref >60)
Glucose, Bld: 172 mg/dL — ABNORMAL HIGH (ref 70–99)
Potassium: 3.1 mmol/L — ABNORMAL LOW (ref 3.5–5.1)
Sodium: 139 mmol/L (ref 135–145)

## 2022-06-06 LAB — RESP PANEL BY RT-PCR (RSV, FLU A&B, COVID)  RVPGX2
Influenza A by PCR: NEGATIVE
Influenza B by PCR: NEGATIVE
Resp Syncytial Virus by PCR: NEGATIVE
SARS Coronavirus 2 by RT PCR: NEGATIVE

## 2022-06-06 MED ORDER — POTASSIUM CHLORIDE CRYS ER 20 MEQ PO TBCR
40.0000 meq | EXTENDED_RELEASE_TABLET | Freq: Once | ORAL | Status: AC
  Administered 2022-06-06: 40 meq via ORAL
  Filled 2022-06-06: qty 2

## 2022-06-06 MED ORDER — SODIUM CHLORIDE 0.9 % IV BOLUS
1000.0000 mL | Freq: Once | INTRAVENOUS | Status: AC
  Administered 2022-06-06: 1000 mL via INTRAVENOUS

## 2022-06-06 NOTE — ED Triage Notes (Addendum)
Pt BIB EMS due to chest pain, shob, dizziness, nausea that lasted 30 min today. Pt had cardiac hx, MI's/ dementia. Pt is axox2 at baseline. 324 aspirin, no nitro. VSS. Pt states he is on elliquis

## 2022-06-06 NOTE — ED Provider Notes (Signed)
[facility] EMERGENCY DEPARTMENT Provider Note   CSN: [id number] Arrival date & time: 06/06/22  8099     History  Chief Complaint  Patient presents with   Chest Pain    Shane Holloway is a 71 y.o. male.  Patient here after episode of chest pain and lightheadedness after standing up quickly.  He states that he fell to the ground but he did not lose consciousness or hit his head.  He is on Eliquis.  He mostly landed on his knee but he has not been having any pain in his knee.  Says he just want to the ground onto carpet.  He has a history of hypertension, heart failure, CAD, CKD and stroke.  Overall he is not having any chest pain or shortness of breath or dizziness now.  Denies any nausea, vomiting, diarrhea.  Denies any black or bloody stools.  May be some poor p.o. intake recently.  Has been generally well with chest discomfort since his cardiac stent couple months ago.  The history is provided by the patient.       Home Medications Prior to Admission medications   Medication Sig Start Date End Date Taking? Authorizing Provider  amiodarone (PACERONE) 200 MG tablet Take 1 tablet (200 mg total) by mouth daily. 03/21/21   Little Ishikawa, MD  amiodarone (PACERONE) 200 MG tablet Take 1 tablet by mouth daily. 04/17/21 04/17/22  [provider]  amLODipine (NORVASC) 2.5 MG tablet Take 2.5 mg by mouth daily. Patient not taking: Reported on 02/28/2022    [provider]  apixaban (ELIQUIS) 5 MG TABS tablet Take by mouth. 03/28/21 03/23/22  [provider]  atorvastatin (LIPITOR) 80 MG tablet Take 80 mg by mouth daily. 12/12/20   [provider]  benzonatate (TESSALON PERLES) 100 MG capsule Take 1 capsule (100 mg total) by mouth 3 (three) times daily as needed for cough. Patient not taking: Reported on 02/28/2022 05/14/21   Orvis Brill, MD  Cholecalciferol 125 MCG (5000 UT) TABS Take 1 tablet by mouth daily.    [provider]  cyclobenzaprine (FLEXERIL) 5 MG tablet Take 5 mg by mouth 2 (two) times daily. Patient not taking: Reported on 02/28/2022 12/10/21   [provider]  ELIQUIS 5 MG TABS tablet Take 5 mg by mouth 2 (two) times daily. 03/09/21   [provider]  empagliflozin (JARDIANCE) 10 MG TABS tablet Take 1 tablet by mouth daily. 08/08/21   [provider]  finasteride (PROSCAR) 5 MG tablet Take 5 mg by mouth daily. Patient not taking: Reported on 02/28/2022    [provider]  furosemide (LASIX) 20 MG tablet Take 1 tablet (20 mg total) by mouth daily. 09/15/11 12/10/21  Robyn Haber, MD  glipiZIDE (GLUCOTROL XL) 10 MG 24 hr tablet Take 10 mg by mouth daily. 11/14/21   [provider]  glipiZIDE (GLUCOTROL XL) 10 MG 24 hr tablet Take 1 tablet by mouth daily. Patient not taking: Reported on 02/28/2022 04/27/21   [provider]  JARDIANCE 10 MG TABS tablet Take 10 mg by mouth daily. 03/09/21   [provider]  losartan (COZAAR) 100 MG tablet Take 100 mg by mouth daily. Patient not taking: Reported on 02/28/2022 11/29/21   [provider]  losartan (COZAAR) 100 MG tablet Take 1 tablet by mouth daily. 11/29/21 11/29/22  [provider]  metoprolol tartrate (LOPRESSOR) 50 MG tablet Take 1 tablet (50 mg total) by mouth 2 (two) times  daily. Patient not taking: Reported on 02/28/2022 03/21/21   Little Ishikawa, MD  metoprolol tartrate (LOPRESSOR) 50 MG tablet Take by mouth. 04/17/21 04/17/22  [provider]  nitroGLYCERIN (NITROSTAT) 0.4 MG SL tablet Place under the tongue. 01/23/22   [provider]  Tamsulosin HCl (FLOMAX) 0.4 MG CAPS TAKE ONE CAPSULE BY MOUTH EVERY DAY Patient not taking: Reported on 02/28/2022 12/27/11   Harrison Mons, PA      Allergies    Ramipril, Shellfish allergy, and Sulfa antibiotics    Review of Systems   Review of Systems  Physical Exam Updated Vital Signs BP 134/84 (BP  Location: Left Arm)   Pulse 64   Temp (!) 97.5 F (36.4 C) (Oral)   Resp 19   Ht '6\' 1"'$  (1.854 m)   Wt 107 kg   SpO2 100%   BMI 31.14 kg/m  Physical Exam Vitals and nursing note reviewed.  Constitutional:      General: He is not in acute distress.    Appearance: He is well-developed. He is not ill-appearing.  HENT:     Head: Normocephalic and atraumatic.  Eyes:     Extraocular Movements: Extraocular movements intact.     Conjunctiva/sclera: Conjunctivae normal.     Pupils: Pupils are equal, round, and reactive to light.  Cardiovascular:     Rate and Rhythm: Normal rate and regular rhythm.     Pulses:          Radial pulses are 2+ on the right side and 2+ on the left side.     Heart sounds: Normal heart sounds. No murmur heard. Pulmonary:     Effort: Pulmonary effort is normal. No respiratory distress.     Breath sounds: Normal breath sounds. No decreased breath sounds or wheezing.  Abdominal:     Palpations: Abdomen is soft.     Tenderness: There is no abdominal tenderness.  Musculoskeletal:        General: No swelling. Normal range of motion.     Cervical back: Normal range of motion and neck supple.  Skin:    General: Skin is warm and dry.     Capillary Refill: Capillary refill takes less than 2 seconds.  Neurological:     General: No focal deficit present.     Mental Status: He is alert and oriented to person, place, and time.     Cranial Nerves: No cranial nerve deficit.     Motor: No weakness.     Comments: 5+ out of 5 strength throughout, normal sensation, no drift, normal finger-nose-finger, normal speech  Psychiatric:        Mood and Affect: Mood normal.     ED Results / Procedures / Treatments   Labs (all labs ordered are listed, but only abnormal results are displayed) Labs Reviewed  BASIC METABOLIC PANEL - Abnormal; Notable for the following components:      Result Value   Potassium 3.1 (*)    CO2 21 (*)    Glucose, Bld 172 (*)    BUN 30 (*)     Creatinine, Ser 2.83 (*)    GFR, Estimated 23 (*)    All other components within normal limits  CBC - Abnormal; Notable for the following components:   Platelets 131 (*)    All other components within normal limits  TROPONIN I (HIGH SENSITIVITY) - Abnormal; Notable for the following components:   Troponin I (High Sensitivity) 22 (*)    All other components within normal limits  TROPONIN I (HIGH SENSITIVITY) - Abnormal; Notable for the following components:   Troponin I (High Sensitivity) 18 (*)    All other components within normal limits  RESP PANEL BY RT-PCR (RSV, FLU A&B, COVID)  [id number]    EKG EKG Interpretation  Date/Time:  Thursday June 06 2022 10:04:20 EST Ventricular Rate:  60 PR Interval:  266 QRS Duration: 126 QT Interval:  503 QTC Calculation: 503 R Axis:   -42 Text Interpretation: Sinus rhythm Prolonged PR interval Left bundle branch block Confirmed by Lennice Sites ([id number]) on 06/06/2022 10:14:34 AM  Radiology CT HEAD WO CONTRAST (5MM)  Result Date: 06/06/2022 CLINICAL DATA:  Mental status change EXAM: CT HEAD WITHOUT CONTRAST TECHNIQUE: Contiguous axial images were obtained from the base of the skull through the vertex without intravenous contrast. RADIATION DOSE REDUCTION: This exam was performed according to the departmental dose-optimization program which includes automated exposure control, adjustment of the mA and/or kV according to patient size and/or use of iterative reconstruction technique. COMPARISON:  None Available. FINDINGS: Brain: Generalized atrophy. Chronic microvascular ischemic change throughout the white matter. Chronic infarcts in the occipital lobe bilaterally. Negative for acute infarct, hemorrhage, or mass. Negative for hydrocephalus. Small chronic infarct right cerebellum. Vascular: Negative for hyperdense vessel. Extensive calcification distal left vertebral artery with tortuosity of the basilar. Skull: Negative Sinuses/Orbits: Mucosal edema  paranasal sinuses. Bilateral cataract extraction Other: None IMPRESSION: Atrophy and chronic microvascular ischemia. Chronic infarcts in the occipital lobe bilaterally. No acute abnormality. Electronically Signed   By: Franchot Gallo M.D.   On: 06/06/2022 11:14   DG Chest 2 View  Result Date: 06/06/2022 CLINICAL DATA:  Chest pain and shortness of breath. EXAM: CHEST - 2 VIEW COMPARISON:  Chest x-ray dated December 16, 2021. FINDINGS: Stable cardiomediastinal silhouette status post CABG and AVR. Normal pulmonary vascularity. No focal consolidation, pleural effusion, or pneumothorax. No acute osseous abnormality. IMPRESSION: No active cardiopulmonary disease. Electronically Signed   By: Titus Dubin M.D.   On: 06/06/2022 10:35    Procedures Procedures    Medications Ordered in ED Medications  sodium chloride 0.9 % bolus 1,000 mL (0 mLs Intravenous Stopped 06/06/22 1213)  potassium chloride SA (KLOR-CON M) CR tablet 40 mEq (40 mEq Oral Given 06/06/22 1342)    ED Course/ Medical Decision Making/ A&P                           Medical Decision Making Amount and/or Complexity of Data Reviewed Labs: ordered. Radiology: ordered.  Risk Prescription drug management.   [name] is here with chest pain, shortness of breath, dizziness that lasted a few minutes today.  Normal vitals.  No fever.  I did check orthostatics and patient had a systolic pressure of 387 sitting and 100 standing.  Minimally symptomatic.  He was able to stand on his own pressure without any issues.  Neurologically he is intact.  He said he stood up quickly today and got an episode of chest discomfort, lightheadedness.  He fell to the ground but on his right knee on carpet.  Did not hit his head or lose consciousness.  Not having any pain.  He is on Eliquis.  EKG shows sinus rhythm.  No obvious ischemic changes.  Unchanged from other EKGs.  Differential diagnosis is possibly some dehydration/orthostatic/vasovagal type  event.  Will evaluate for ACS, head trauma, other electrolyte issues.  I have no concern for PE.  I have no concern for stroke at  this time.  His neuroexam is normal.  Asymptomatic now.  Will get CBC, BMP, troponin, head CT, chest x-ray.  Will give IV fluid bolus and reevaluate.  Per my review and interpretation of labs, troponin stable x 2.  Better than his baseline.  Overall atypical story for ACS.  He has no significant anemia or electrolyte abnormality.  Creatinine is at baseline.  COVID and flu test unremarkable.  CT of the head is unremarkable.  I reviewed interpreted CT scan.  Chest x-ray shows no evidence of pneumonia or pneumothorax.  Potassium is 3.1.  This was repleted.  He is feeling better after IV fluids.  Overall suspect mild orthostatic/vasovagal process.  Discharged in good condition.  Understands return precautions.  This chart was dictated using voice recognition software.  Despite best efforts to proofread,  errors can occur which can change the documentation meaning.         Final Clinical Impression(s) / ED Diagnoses Final diagnoses:  Orthostatic lightheadedness    Rx / DC Orders ED Discharge Orders     None         Lennice Sites, DO 06/06/22 1544

## 2022-06-06 NOTE — Discharge Instructions (Signed)
Lab work today is unremarkable.  Please be careful when you change positions as this could trigger your symptoms.  Take your time when you go from laying, to sitting, to standing.  Make sure you are using your walker at all times.

## 2022-06-14 ENCOUNTER — Encounter: Payer: Self-pay | Admitting: *Deleted

## 2022-06-14 DIAGNOSIS — Z955 Presence of coronary angioplasty implant and graft: Secondary | ICD-10-CM

## 2022-06-14 DIAGNOSIS — I214 Non-ST elevation (NSTEMI) myocardial infarction: Secondary | ICD-10-CM

## 2022-06-14 NOTE — Progress Notes (Signed)
Cardiac Individual Treatment Plan  Patient Details  Name: Shane Holloway MRN: [id number] Date of Birth: 05/17/1951 Referring Provider:   Flowsheet Row Cardiac Rehab from 03/11/2022 in [facility] Cardiac and Pulmonary Rehab  Referring Provider Bernell List MD       Initial Encounter Date:  Flowsheet Row Cardiac Rehab from 03/11/2022 in [facility] Cardiac and Pulmonary Rehab  Date 03/11/22       Visit Diagnosis: NSTEMI (non-ST elevated myocardial infarction) [facility])  Status post coronary artery stent placement  Patient's Home Medications on Admission:  Current Outpatient Medications:    amiodarone (PACERONE) 200 MG tablet, Take 1 tablet (200 mg total) by mouth daily., Disp: 30 tablet, Rfl: 0   amiodarone (PACERONE) 200 MG tablet, Take 1 tablet by mouth daily., Disp: , Rfl:    amLODipine (NORVASC) 2.5 MG tablet, Take 2.5 mg by mouth daily. (Patient not taking: Reported on 02/28/2022), Disp: , Rfl:    apixaban (ELIQUIS) 5 MG TABS tablet, Take by mouth., Disp: , Rfl:    atorvastatin (LIPITOR) 80 MG tablet, Take 80 mg by mouth daily., Disp: , Rfl:    benzonatate (TESSALON PERLES) 100 MG capsule, Take 1 capsule (100 mg total) by mouth 3 (three) times daily as needed for cough. (Patient not taking: Reported on 02/28/2022), Disp: 20 capsule, Rfl: 0   Cholecalciferol 125 MCG (5000 UT) TABS, Take 1 tablet by mouth daily., Disp: , Rfl:    cyclobenzaprine (FLEXERIL) 5 MG tablet, Take 5 mg by mouth 2 (two) times daily. (Patient not taking: Reported on 02/28/2022), Disp: , Rfl:    ELIQUIS 5 MG TABS tablet, Take 5 mg by mouth 2 (two) times daily., Disp: , Rfl:    empagliflozin (JARDIANCE) 10 MG TABS tablet, Take 1 tablet by mouth daily., Disp: , Rfl:    finasteride (PROSCAR) 5 MG tablet, Take 5 mg by mouth daily. (Patient not taking: Reported on 02/28/2022), Disp: , Rfl:    furosemide (LASIX) 20 MG tablet, Take 1 tablet (20 mg total) by mouth daily., Disp: 30 tablet, Rfl: 3   glipiZIDE (GLUCOTROL XL) 10 MG 24  hr tablet, Take 10 mg by mouth daily., Disp: , Rfl:    glipiZIDE (GLUCOTROL XL) 10 MG 24 hr tablet, Take 1 tablet by mouth daily. (Patient not taking: Reported on 02/28/2022), Disp: , Rfl:    JARDIANCE 10 MG TABS tablet, Take 10 mg by mouth daily., Disp: , Rfl:    losartan (COZAAR) 100 MG tablet, Take 100 mg by mouth daily. (Patient not taking: Reported on 02/28/2022), Disp: , Rfl:    losartan (COZAAR) 100 MG tablet, Take 1 tablet by mouth daily., Disp: , Rfl:    metoprolol tartrate (LOPRESSOR) 50 MG tablet, Take 1 tablet (50 mg total) by mouth 2 (two) times daily. (Patient not taking: Reported on 02/28/2022), Disp: 60 tablet, Rfl: 0   metoprolol tartrate (LOPRESSOR) 50 MG tablet, Take by mouth., Disp: , Rfl:    nitroGLYCERIN (NITROSTAT) 0.4 MG SL tablet, Place under the tongue., Disp: , Rfl:    Tamsulosin HCl (FLOMAX) 0.4 MG CAPS, TAKE ONE CAPSULE BY MOUTH EVERY DAY (Patient not taking: Reported on 02/28/2022), Disp: 30 capsule, Rfl: 2  Past Medical History: Past Medical History:  Diagnosis Date   Allergy    Aortic stenosis    Arthritis    hands   BPH (benign prostatic hyperplasia)    CHF (congestive heart failure) (HCC)    CKD (chronic kidney disease)    Diabetes mellitus    Dyspnea  Hyperlipidemia    Hypertension    Myocardial infarction ([place]) 06/10/2002   Stroke ([place]) 09/2020   Some memory deficits   Vertigo    Wears dentures    full upper    Tobacco Use: Social History   Tobacco Use  Smoking Status Former   Packs/day: 0.25   Years: 4.00   Total pack years: 1.00   Types: Cigarettes   Quit date: 1995   Years since quitting: 29.0  Smokeless Tobacco Former   Types: Chew   Quit date: 1995    Labs: Review Flowsheet       Latest Ref Rng & Units 07/27/2007 07/29/2007 08/10/2011 12/11/2021  Labs for ITP Cardiac and Pulmonary Rehab  Cholestrol 0 - 200 mg/dL - - 178  171   LDL (calc) 0 - 99 mg/dL - - 117  UNABLE TO CALCULATE IF TRIGLYCERIDE OVER 400 mg/dL   Direct LDL 0 - 99  mg/dL - - - 58.1   HDL-C >40 mg/dL - - 35  28   Trlycerides <150 mg/dL - - 130  509   Hemoglobin A1c 4.8 - 5.6 % 7.2 (NOTE)   The ADA recommends the following therapeutic goals for glycemic   control related to Hgb A1C measurement:   Goal of Therapy:   < 7.0% Hgb A1C   Action Suggested:  > 8.0% Hgb A1C   Ref:  Diabetes Care, 22, Suppl. 1, 1999  - 7.3  8.9   PH, Arterial - 7.423  7.380  7.363  7.391  7.351  - -  PCO2 arterial - 38.6  37.7  40.2  39.9  49.2  - -  Bicarbonate - 24.8  22.3  22.6  24.4  27.2  - -  TCO2 - 26.0  '23  24  26  29  '$ - -  Acid-base deficit - - 3.0  2.0  1.0  - -  O2 Saturation - 96.9  95.0  91.0  100.0  - -     Exercise Target Goals: Exercise Program Goal: Individual exercise prescription set using results from initial 6 min walk test and THRR while considering  patient's activity barriers and safety.   Exercise Prescription Goal: Initial exercise prescription builds to 30-45 minutes a day of aerobic activity, 2-3 days per week.  Home exercise guidelines will be given to patient during program as part of exercise prescription that the participant will acknowledge.   Education: Aerobic Exercise: - Group verbal and visual presentation on the components of exercise prescription. Introduces F.I.T.T principle from ACSM for exercise prescriptions.  Reviews F.I.T.T. principles of aerobic exercise including progression. Written material given at graduation. Flowsheet Row Cardiac Rehab from 05/08/2022 in [facility] Cardiac and Pulmonary Rehab  Education need identified 03/11/22  Date 04/17/22  Educator [facility]  Instruction Review Code 1- Verbalizes Understanding       Education: Resistance Exercise: - Group verbal and visual presentation on the components of exercise prescription. Introduces F.I.T.T principle from ACSM for exercise prescriptions  Reviews F.I.T.T. principles of resistance exercise including progression. Written material given at graduation.    Education: Exercise  & Equipment Safety: - Individual verbal instruction and demonstration of equipment use and safety with use of the equipment. Flowsheet Row Cardiac Rehab from 05/08/2022 in [facility] Cardiac and Pulmonary Rehab  Date 02/28/22  Educator [facility]  Instruction Review Code 1- Verbalizes Understanding       Education: Exercise Physiology & General Exercise Guidelines: - Group verbal and written instruction with models to review the exercise  physiology of the cardiovascular system and associated critical values. Provides general exercise guidelines with specific guidelines to those with heart or lung disease.    Education: Flexibility, Balance, Mind/Body Relaxation: - Group verbal and visual presentation with interactive activity on the components of exercise prescription. Introduces F.I.T.T principle from ACSM for exercise prescriptions. Reviews F.I.T.T. principles of flexibility and balance exercise training including progression. Also discusses the mind body connection.  Reviews various relaxation techniques to help reduce and manage stress (i.e. Deep breathing, progressive muscle relaxation, and visualization). Balance handout provided to take home. Written material given at graduation.   Activity Barriers & Risk Stratification:  Activity Barriers & Cardiac Risk Stratification - 03/11/22 1222       Activity Barriers & Cardiac Risk Stratification   Activity Barriers Deconditioning;Muscular Weakness;Balance Concerns;History of Falls;Shortness of Breath;Other (comment);Assistive Device    Comments cognitive impairment, Hx CVA    Cardiac Risk Stratification High             6 Minute Walk:  6 Minute Walk     Row Name 03/11/22 1211 05/17/22 0825       6 Minute Walk   Phase Initial Discharge    Distance 860 feet 1076 feet    Distance % Change -- 25.1 %    Distance Feet Change -- 216 ft    Walk Time 6 minutes 6 minutes    # of Rest Breaks 0 0    MPH 1.63 2.04    METS 1.85 2.47    RPE 9 121     Perceived Dyspnea  1 --    VO2 Peak 6.47 8.63    Symptoms Yes (comment) No    Comments SOB, leg pain 5/10 --    Resting HR 80 bpm 61 bpm    Resting BP 114/62 124/74    Resting Oxygen Saturation  96 % 95 %    Exercise Oxygen Saturation  during 6 min walk 96 % 96 %    Max Ex. HR 95 bpm 113 bpm    Max Ex. BP 126/74 142/76    2 Minute Post BP 122/70 --             Oxygen Initial Assessment:   Oxygen Re-Evaluation:   Oxygen Discharge (Final Oxygen Re-Evaluation):   Initial Exercise Prescription:  Initial Exercise Prescription - 03/11/22 1200       Date of Initial Exercise RX and Referring Provider   Date 03/11/22    Referring Provider Bernell List MD      Oxygen   Maintain Oxygen Saturation 88% or higher      Treadmill   MPH 1.5    Grade 0    Minutes 15    METs 2.15      NuStep   Level 2    SPM 80    Minutes 15    METs 2      Track   Laps 22    Minutes 15    METs 2.2      Prescription Details   Frequency (times per week) 3    Duration Progress to 30 minutes of continuous aerobic without signs/symptoms of physical distress      Intensity   THRR 40-80% of Max Heartrate 107-134    Ratings of Perceived Exertion 11-13    Perceived Dyspnea 0-4      Progression   Progression Continue to progress workloads to maintain intensity without signs/symptoms of physical distress.      Resistance Training  Training Prescription Yes    Weight 4 lb    Reps 10-15             Perform Capillary Blood Glucose checks as needed.  Exercise Prescription Changes:   Exercise Prescription Changes     Row Name 03/11/22 1200 03/27/22 0700 04/08/22 1000 04/22/22 1400 05/06/22 1100     Response to Exercise   Blood Pressure (Admit) 114/62 -- 128/76 118/70 134/80   Blood Pressure (Exercise) 126/74 -- 132/62 158/82 --   Blood Pressure (Exit) 122/70 -- 130/82 120/80 144/82   Heart Rate (Admit) 80 bpm -- 76 bpm 70 bpm 73 bpm   Heart Rate (Exercise) 95 bpm -- 98  bpm 95 bpm 84 bpm   Heart Rate (Exit) 80 bpm -- 72 bpm 77 bpm 75 bpm   Oxygen Saturation (Admit) 96 % -- -- -- --   Oxygen Saturation (Exercise) 96 % -- -- -- --   Rating of Perceived Exertion (Exercise) 9 -- '13 13 13   '$ Perceived Dyspnea (Exercise) 1 -- -- -- --   Symptoms SOB, leg pain 5/10 -- none none none   Comments walk test results -- 3rd full week of exercise -- --   Duration -- -- Progress to 30 minutes of  aerobic without signs/symptoms of physical distress Progress to 30 minutes of  aerobic without signs/symptoms of physical distress Continue with 30 min of aerobic exercise without signs/symptoms of physical distress.   Intensity -- -- THRR unchanged THRR unchanged THRR unchanged     Progression   Progression -- -- Continue to progress workloads to maintain intensity without signs/symptoms of physical distress. Continue to progress workloads to maintain intensity without signs/symptoms of physical distress. Continue to progress workloads to maintain intensity without signs/symptoms of physical distress.   Average METs -- -- 1.92 1.85 1.81     Resistance Training   Training Prescription -- -- Yes Yes Yes   Weight -- -- 4 lb 4 lb 4 lb   Reps -- -- 10-15 10-15 10-15     Interval Training   Interval Training -- -- No No No     Recumbant Bike   Level -- -- -- 2.8 --   Minutes -- -- -- 15 --   METs -- -- -- 2.4 --     NuStep   Level -- -- '4 4 2   '$ Minutes -- -- '15 15 15   '$ METs -- -- 2 2.1 1.6     T5 Nustep   Level -- -- -- -- 1   Minutes -- -- -- -- 15     Biostep-RELP   Level -- -- 2 -- --   Minutes -- -- 15 -- --   METs -- -- 2 -- --     Track   Laps -- -- '24 15 16   '$ Minutes -- -- '15 15 15   '$ METs -- -- 2.31 1.82 1.87     Home Exercise Plan   Plans to continue exercise at -- Home (comment)  walking, treadmill Home (comment)  walking, treadmill Home (comment)  walking, treadmill Home (comment)  walking, treadmill   Frequency -- Add 2 additional days to program  exercise sessions. Add 2 additional days to program exercise sessions. Add 2 additional days to program exercise sessions. Add 2 additional days to program exercise sessions.   Initial Home Exercises Provided -- 03/27/22 03/27/22 03/27/22 03/27/22     Oxygen   Maintain Oxygen Saturation -- -- 88% or higher  88% or higher 88% or higher    Row Name 05/20/22 1400 06/06/22 0900           Response to Exercise   Blood Pressure (Admit) 124/74 122/60      Blood Pressure (Exit) 132/64 122/66      Heart Rate (Admit) 61 bpm 77 bpm      Heart Rate (Exercise) 113 bpm 72 bpm      Heart Rate (Exit) 63 bpm 74 bpm      Oxygen Saturation (Admit) -- 97 %      Oxygen Saturation (Exercise) -- 96 %      Oxygen Saturation (Exit) -- 96 %      Rating of Perceived Exertion (Exercise) 14 13      Symptoms none none      Duration Continue with 30 min of aerobic exercise without signs/symptoms of physical distress. Continue with 30 min of aerobic exercise without signs/symptoms of physical distress.      Intensity THRR unchanged THRR unchanged        Progression   Progression Continue to progress workloads to maintain intensity without signs/symptoms of physical distress. Continue to progress workloads to maintain intensity without signs/symptoms of physical distress.      Average METs 2.02 2.25        Resistance Training   Training Prescription Yes Yes      Weight 4 lb 4 lb      Reps 10-15 10-15        Interval Training   Interval Training No No        NuStep   Level 4 4      Minutes 30 30      METs 2.5 2        Track   Laps 17 --      Minutes 15 --      METs 1.92 --        Home Exercise Plan   Plans to continue exercise at Home (comment)  walking, treadmill Home (comment)  walking, treadmill      Frequency Add 2 additional days to program exercise sessions. Add 2 additional days to program exercise sessions.      Initial Home Exercises Provided 03/27/22 03/27/22        Oxygen   Maintain  Oxygen Saturation 88% or higher 88% or higher               Exercise Comments:   Exercise Comments     Row Name 03/13/22 0825           Exercise Comments First full day of exercise!  Patient was oriented to gym and equipment including functions, settings, policies, and procedures.  Patient's individual exercise prescription and treatment plan were reviewed.  All starting workloads were established based on the results of the 6 minute walk test done at initial orientation visit.  The plan for exercise progression was also introduced and progression will be customized based on patient's performance and goals.                Exercise Goals and Review:   Exercise Goals     Row Name 03/11/22 1225             Exercise Goals   Increase Physical Activity Yes       Intervention Develop an individualized exercise prescription for aerobic and resistive training based on initial evaluation findings, risk stratification, comorbidities and participant's personal goals.;Provide advice, education, support and counseling about  physical activity/exercise needs.       Expected Outcomes Short Term: Attend rehab on a regular basis to increase amount of physical activity.;Long Term: Add in home exercise to make exercise part of routine and to increase amount of physical activity.;Long Term: Exercising regularly at least 3-5 days a week.       Increase Strength and Stamina Yes       Intervention Provide advice, education, support and counseling about physical activity/exercise needs.;Develop an individualized exercise prescription for aerobic and resistive training based on initial evaluation findings, risk stratification, comorbidities and participant's personal goals.       Expected Outcomes Short Term: Increase workloads from initial exercise prescription for resistance, speed, and METs.;Short Term: Perform resistance training exercises routinely during rehab and add in resistance training at  home;Long Term: Improve cardiorespiratory fitness, muscular endurance and strength as measured by increased METs and functional capacity (6MWT)       Able to understand and use rate of perceived exertion (RPE) scale Yes       Intervention Provide education and explanation on how to use RPE scale       Expected Outcomes Short Term: Able to use RPE daily in rehab to express subjective intensity level;Long Term:  Able to use RPE to guide intensity level when exercising independently       Able to understand and use Dyspnea scale Yes       Intervention Provide education and explanation on how to use Dyspnea scale       Expected Outcomes Short Term: Able to use Dyspnea scale daily in rehab to express subjective sense of shortness of breath during exertion;Long Term: Able to use Dyspnea scale to guide intensity level when exercising independently       Knowledge and understanding of Target Heart Rate Range (THRR) Yes       Intervention Provide education and explanation of THRR including how the numbers were predicted and where they are located for reference       Expected Outcomes Short Term: Able to state/look up THRR;Short Term: Able to use daily as guideline for intensity in rehab;Long Term: Able to use THRR to govern intensity when exercising independently       Able to check pulse independently Yes       Intervention Provide education and demonstration on how to check pulse in carotid and radial arteries.;Review the importance of being able to check your own pulse for safety during independent exercise       Expected Outcomes Short Term: Able to explain why pulse checking is important during independent exercise;Long Term: Able to check pulse independently and accurately       Understanding of Exercise Prescription Yes       Intervention Provide education, explanation, and written materials on patient's individual exercise prescription       Expected Outcomes Short Term: Able to explain program  exercise prescription;Long Term: Able to explain home exercise prescription to exercise independently                Exercise Goals Re-Evaluation :  Exercise Goals Re-Evaluation     Row Name 03/13/22 0826 03/27/22 0756 04/08/22 1049 04/19/22 0747 04/22/22 1441     Exercise Goal Re-Evaluation   Exercise Goals Review Able to understand and use rate of perceived exertion (RPE) scale;Able to understand and use Dyspnea scale;Knowledge and understanding of Target Heart Rate Range (THRR);Understanding of Exercise Prescription Able to understand and use rate of perceived exertion (RPE) scale;Able to  understand and use Dyspnea scale;Knowledge and understanding of Target Heart Rate Range (THRR);Understanding of Exercise Prescription;Increase Strength and Stamina;Increase Physical Activity;Able to check pulse independently Increase Physical Activity;Increase Strength and Stamina;Understanding of Exercise Prescription Increase Physical Activity;Increase Strength and Stamina;Understanding of Exercise Prescription Increase Physical Activity;Increase Strength and Stamina;Understanding of Exercise Prescription   Comments Reviewed RPE and dyspnea scales, THR and program prescription with pt today.  Pt voiced understanding and was given a copy of goals to take home. Reviewed home exercise with pt today.  Pt plans to walk and use brother's treadmill for exercise.  Reviewed THR, pulse, RPE, sign and symptoms, pulse oximetery and when to call 911 or MD.  Also discussed weather considerations and indoor options.  Pt voiced understanding.  He is already starting to notice a difference in his stamina. Timmothy Sours is doing well for the first couple weeks he has been here. He has so far been able to walk 24 laps on the track and is now up to level 4 on the T4 Nustep. RPEs are staying in appropriate range. We will continue to monitor. Timmothy Sours is trying to improve his levels in the program. He wants to workout at the [facility] when he is done  with the program. His brother takes him to alot of appointments and he would like to workout with his brother. Timmothy Sours is doing well in the program. He has consistently worked at a MET level above 1.85 METs. He also started using the recumbent bike and has done well at level 2.8. His laps on the track did go down to 15 so we will encourage him to push for more laps. We will continue to monitor his progress in the program.   Expected Outcomes Short: Use RPE daily to regulate intensity. Long: Follow program prescription in THR. Short: Add in exercise on off days Long: continue to exercise indpendently Short: Continue to increase laps on track Long: Continue to increase overall MET level Short: start going to the [facility]. Long: maintain working out independently. Short: Continue to push for more laps on the track. Long: Continue to increase strength and stamina.    [place] Name 05/06/22 1145 05/17/22 0826 05/20/22 1411 06/06/22 0903       Exercise Goal Re-Evaluation   Exercise Goals Review Increase Physical Activity;Increase Strength and Stamina;Understanding of Exercise Prescription Increase Physical Activity;Increase Strength and Stamina;Understanding of Exercise Prescription Increase Physical Activity;Increase Strength and Stamina;Understanding of Exercise Prescription Increase Physical Activity;Increase Strength and Stamina;Understanding of Exercise Prescription    Comments Timmothy Sours continues to do well in the program. although he is limited right now by his back pain. He has not been able to walk as many laps on the track as before. He is now using a pillow to help with lumbar support when on the seated machines. He also went down to level 2 on the T4 Nustep, staff will remind patient to keep level up and if not, increase beyond that if able. He is not quite hitting his THR. Will continue to monitor. Don improved his post 6MWT by 25%!!  He is planning to join the [facility] after graduation just after Christmas.  He is pleased  with the progress he is making and that he is feeling better.  His stamina has greatly improved as he is able to exercise the whole time now!! Timmothy Sours is doing well in rehab and is close to graduating. He improved on his post 6MWT by 25%! He also was able to walk up 17 laps on the track  and he tolerated the T4 for 30 minutes during multiple other sessions. He improved his overall average MET level as well to 2.02 METs. We will continue to monitor his progress until he graduates from the program. Timmothy Sours has not attended since 05/29/22.  He still has two sessions left. Left message on his brother's voicemail.    Expected Outcomes Short: Increase laps on track tolerated by back, increase seated machine levels back up. Continue to exercise independently Short: Graduate. Long: Continue to exercise independently. Short: Graduate. Long: Continue to exercise independently.             Discharge Exercise Prescription (Final Exercise Prescription Changes):  Exercise Prescription Changes - 06/06/22 0900       Response to Exercise   Blood Pressure (Admit) 122/60    Blood Pressure (Exit) 122/66    Heart Rate (Admit) 77 bpm    Heart Rate (Exercise) 72 bpm    Heart Rate (Exit) 74 bpm    Oxygen Saturation (Admit) 97 %    Oxygen Saturation (Exercise) 96 %    Oxygen Saturation (Exit) 96 %    Rating of Perceived Exertion (Exercise) 13    Symptoms none    Duration Continue with 30 min of aerobic exercise without signs/symptoms of physical distress.    Intensity THRR unchanged      Progression   Progression Continue to progress workloads to maintain intensity without signs/symptoms of physical distress.    Average METs 2.25      Resistance Training   Training Prescription Yes    Weight 4 lb    Reps 10-15      Interval Training   Interval Training No      NuStep   Level 4    Minutes 30    METs 2      Home Exercise Plan   Plans to continue exercise at Home (comment)   walking, treadmill   Frequency  Add 2 additional days to program exercise sessions.    Initial Home Exercises Provided 03/27/22      Oxygen   Maintain Oxygen Saturation 88% or higher             Nutrition:  Target Goals: Understanding of nutrition guidelines, daily intake of sodium '1500mg'$ , cholesterol '200mg'$ , calories 30% from fat and 7% or less from saturated fats, daily to have 5 or more servings of fruits and vegetables.  Education: All About Nutrition: -Group instruction provided by verbal, written material, interactive activities, discussions, models, and posters to present general guidelines for heart healthy nutrition including fat, fiber, MyPlate, the role of sodium in heart healthy nutrition, utilization of the nutrition label, and utilization of this knowledge for meal planning. Follow up email sent as well. Written material given at graduation. Flowsheet Row Cardiac Rehab from 05/08/2022 in [facility] Cardiac and Pulmonary Rehab  Date 05/08/22  Educator [facility]  Instruction Review Code 1- Verbalizes Understanding       Biometrics:  Pre Biometrics - 03/11/22 1225       Pre Biometrics   Height 6' 1.7" (1.872 m)    Weight 227 lb 12.8 oz (103.3 kg)    BMI (Calculated) 29.49    Single Leg Stand 1.21 seconds             Post Biometrics - 05/17/22 0826        Post  Biometrics   Height 6' 1.7" (1.872 m)    Weight 234 lb 4.8 oz (106.3 kg)    BMI (Calculated) 30.33  Single Leg Stand 0 seconds             Nutrition Therapy Plan and Nutrition Goals:  Nutrition Therapy & Goals - 03/11/22 1148       Nutrition Therapy   Diet Heart healthy, low Na, T2DM friendly    Protein (specify units) 85-90g    Fiber 30 grams    Whole Grain Foods 3 servings    Saturated Fats 17 max. grams    Fruits and Vegetables 8 servings/day    Sodium 1.5 grams      Personal Nutrition Goals   Nutrition Goal ST: include more fiber with meals - fruits/vegetables, whole grains. Limit sodium <1.5g/day LT: Manage A1C < 7,  limited saturated fat <17g/day, limit sodium < 1.5g/day, include at least 30g of fiber/day.    Comments 72 y.o. M admitted to cardiac rehab s/p NSTEMI. PMHx includes CAD, CVA (earlier this year), cognitive impairment, HTN, T2DM (A1C 7.8), HLD, HFrEF, SVT, Aortic Stenosis, CKD stg 3. PSHx includes CABG x3 (2009), CABG x2 (2015). Relevant medications includes lipitor, glipizide, vit D3, jardiance. Tajae reports that he has had increased swelling for a while now and recently his weight had gone up (he reports this was a sudden weight gain >5 pounds); highly encouraged him to speak to his MD regarding this as this is likely fluid retention especially as he has history with CKD stg 3 and HFrEF. Discussed limiting sodium to '1500mg'$  as this can help with fluid build-up as well; he reports using many canned and pre-prepared food items which can have a lot of sodium and well as how to build easy meals (sandwiches, eggs with toast and berries, microwaved grains with baked chicken and frozen vegetables) - reviewed label reading and provided handout. Donalds brother lives next door and he reports that his brother will help him go food shopping and cook; his brother was with him during his nutrition consultation today as well. Luca had questions about eggs; discussed how eggs can be a part of a heart healthy diet, but encouaged him to balance this meal with whole wheat toast and/or some fruit like berries. Discussed heart healthy eating, sodium, and T2DM friendly eating. Cay Schillings to practice MyPlate and see what he can add to meals such as fiber rich foods to meals.      Intervention Plan   Intervention Prescribe, educate and counsel regarding individualized specific dietary modifications aiming towards targeted core components such as weight, hypertension, lipid management, diabetes, heart failure and other comorbidities.;Nutrition handout(s) given to patient.    Expected Outcomes Short Term Goal: Understand  basic principles of dietary content, such as calories, fat, sodium, cholesterol and nutrients.;Short Term Goal: A plan has been developed with personal nutrition goals set during dietitian appointment.;Long Term Goal: Adherence to prescribed nutrition plan.             Nutrition Assessments:  MEDIFICTS Score Key: ?70 Need to make dietary changes  40-70 Heart Healthy Diet ? 40 Therapeutic Level Cholesterol Diet  Flowsheet Row Cardiac Rehab from 05/20/2022 in [facility] Cardiac and Pulmonary Rehab  Picture Your Plate Total Score on Discharge 45      Picture Your Plate Scores: <84 Unhealthy dietary pattern with much room for improvement. 41-50 Dietary pattern unlikely to meet recommendations for good health and room for improvement. 51-60 More healthful dietary pattern, with some room for improvement.  >60 Healthy dietary pattern, although there may be some specific behaviors that could be improved.    Nutrition Goals Re-Evaluation:  Nutrition Goals Re-Evaluation     [place] Name 03/27/22 0759 04/19/22 0750 05/17/22 0828         Goals   Current Weight -- 234 lb (106.1 kg) --     Nutrition Goal ST: include more fiber with meals - fruits/vegetables, whole grains. Limit sodium <1.5g/day LT: Manage A1C < 7, limited saturated fat <17g/day, limit sodium < 1.5g/day, include at least 30g of fiber/day. Cut back on portions. Short: cut back on portions. Long: maintain portion sizes independently.     Comment Timmothy Sours is doing well in rehab. He is doing well with his diet. He is trying to get in more fiber with more fruits and vegetables.  He has noticed that his stomach has already started to go down.  He is also taking his fluid pill. He is also watching his salt intake as well. Timmothy Sours states that he wants to lose weight but he eats to much. He usually eats at home. He states that he goes out sometimes. Informed him to use a smaller plate to help him with portion sizes. Timmothy Sours continues to work on his portion  sizes, but his appetite is back now that he is feeling better.  He continues to work on balance     Expected Outcome Short: Conitnue to add variety Long; Continue to improve diet Short: cut back on portions. Long: maintain portion sizes independently. Continue to follow heart healhty eating              Nutrition Goals Discharge (Final Nutrition Goals Re-Evaluation):  Nutrition Goals Re-Evaluation - 05/17/22 8099       Goals   Nutrition Goal Short: cut back on portions. Long: maintain portion sizes independently.    Comment Timmothy Sours continues to work on his portion sizes, but his appetite is back now that he is feeling better.  He continues to work on balance    Expected Outcome Continue to follow heart healhty eating             Psychosocial: Target Goals: Acknowledge presence or absence of significant depression and/or stress, maximize coping skills, provide positive support system. Participant is able to verbalize types and ability to use techniques and skills needed for reducing stress and depression.   Education: Stress, Anxiety, and Depression - Group verbal and visual presentation to define topics covered.  Reviews how body is impacted by stress, anxiety, and depression.  Also discusses healthy ways to reduce stress and to treat/manage anxiety and depression.  Written material given at graduation. Flowsheet Row Cardiac Rehab from 05/08/2022 in [facility] Cardiac and Pulmonary Rehab  Education need identified 03/11/22  Date 04/03/22  Educator [facility]  Instruction Review Code 1- [employer] Understanding       Education: Sleep Hygiene -Provides group verbal and written instruction about how sleep can affect your health.  Define sleep hygiene, discuss sleep cycles and impact of sleep habits. Review good sleep hygiene tips.    Initial Review & Psychosocial Screening:  Initial Psych Review & Screening - 02/28/22 0958       Initial Review   Current issues with None Identified       Family Dynamics   Good Support System? Yes    Comments Gerlad has a good family support system, his brother love next door to him. His brother stays the night at his house at times. He has a postive outlook on his health.      Barriers   Psychosocial barriers to participate in program The patient should benefit  from training in stress management and relaxation.;There are no identifiable barriers or psychosocial needs.      Screening Interventions   Interventions Encouraged to exercise;To provide support and resources with identified psychosocial needs;Provide feedback about the scores to participant;Program counselor consult    Expected Outcomes Short Term goal: Utilizing psychosocial counselor, staff and physician to assist with identification of specific Stressors or current issues interfering with healing process. Setting desired goal for each stressor or current issue identified.;Long Term Goal: Stressors or current issues are controlled or eliminated.;Short Term goal: Identification and review with participant of any Quality of Life or Depression concerns found by scoring the questionnaire.;Long Term goal: The participant improves quality of Life and PHQ9 Scores as seen by post scores and/or verbalization of changes             Quality of Life Scores:   Quality of Life - 05/20/22 1109       Quality of Life   Select Quality of Life      Quality of Life Scores   Health/Function Pre 19.23 %    Health/Function Post 10 %    Health/Function % Change -48 %    Socioeconomic Pre 25 %    Socioeconomic Post 21.43 %    Socioeconomic % Change  -14.28 %    Psych/Spiritual Pre 25.5 %    Psych/Spiritual Post 22 %    Psych/Spiritual % Change -13.73 %    Family Pre 26 %    Family Post 30 %    Family % Change 15.38 %    GLOBAL Pre 22.5 %    GLOBAL Post 16.84 %    GLOBAL % Change -25.16 %            Scores of 19 and below usually indicate a poorer quality of life in these areas.  A  difference of  2-3 points is a clinically meaningful difference.  A difference of 2-3 points in the total score of the Quality of Life Index has been associated with significant improvement in overall quality of life, self-image, physical symptoms, and general health in studies assessing change in quality of life.  PHQ-9: Review Flowsheet       05/20/2022 04/19/2022 03/11/2022  Depression screen PHQ 2/9  Decreased Interest 3 0 3  Down, Depressed, Hopeless 3 0 0  PHQ - 2 Score 6 0 3  Altered sleeping 3 0 2  Tired, decreased energy '3 1 3  '$ Change in appetite 3 2 0  Feeling bad or failure about yourself  0 0 0  Trouble concentrating 3 0 2  Moving slowly or fidgety/restless 3 0 0  Suicidal thoughts 0 0 0  PHQ-9 Score '21 3 10  '$ Difficult doing work/chores Not difficult at all Somewhat difficult Somewhat difficult   Interpretation of Total Score  Total Score Depression Severity:  1-4 = Minimal depression, 5-9 = Mild depression, 10-14 = Moderate depression, 15-19 = Moderately severe depression, 20-27 = Severe depression   Psychosocial Evaluation and Intervention:  Psychosocial Evaluation - 02/28/22 1002       Psychosocial Evaluation & Interventions   Interventions Encouraged to exercise with the program and follow exercise prescription;Relaxation education;Stress management education    Comments Bernhard has a good family support system, his brother love next door to him. His brother stays the night at his house at times. He has a postive outlook on his health.    Expected Outcomes Short: Start Heart[facility] to help with mood. Long: Maintain a healthy  mental state    Continue Psychosocial Services  Follow up required by staff             Psychosocial Re-Evaluation:  Psychosocial Re-Evaluation     [place] Name 03/27/22 7096 04/19/22 0804 05/17/22 0827         Psychosocial Re-Evaluation   Current issues with None Identified None Identified None Identified     Comments Timmothy Sours is doing well  in rehab.  He is feeling good mentally and in a good place. He sleeps well.  He lives next to his brother who helps care for him and drives him to appointments. We talked about being more active and walking more too. Reviewed patient health questionnaire (PHQ-9) with patient for follow up. Previously, patients score indicated signs/symptoms of depression.  Reviewed to see if patient is improving symptom wise while in program.  Score improved and patient states that it is because he has been able to exercise and enjoys it. Timmothy Sours has done well in rehab. He improved his post 6WMT and has already made plans to join the [employer] with his brother after graduation.  He feels better overall and is staying positive.  He feels better to be moving again.     Expected Outcomes Short: Conitnue to exercise for mental boost Long; Continue to stay positive Short: Continue to attend [facility] regularly for regular exercise and social engagement. Long: Continue to improve symptoms and manage a positive mental state. Continue to exercise for mental boost and strength     Interventions Encouraged to attend Cardiac Rehabilitation for the exercise;Stress management education Encouraged to attend Cardiac Rehabilitation for the exercise Encouraged to attend Cardiac Rehabilitation for the exercise     Continue Psychosocial Services  Follow up required by staff Follow up required by staff Follow up required by staff              Psychosocial Discharge (Final Psychosocial Re-Evaluation):  Psychosocial Re-Evaluation - 05/17/22 0827       Psychosocial Re-Evaluation   Current issues with None Identified    Comments Timmothy Sours has done well in rehab. He improved his post 6WMT and has already made plans to join the [employer] with his brother after graduation.  He feels better overall and is staying positive.  He feels better to be moving again.    Expected Outcomes Continue to exercise for mental boost and strength    Interventions Encouraged to  attend Cardiac Rehabilitation for the exercise    Continue Psychosocial Services  Follow up required by staff             Vocational Rehabilitation: Provide vocational rehab assistance to qualifying candidates.   Vocational Rehab Evaluation & Intervention:  Vocational Rehab - 03/11/22 1228       Initial Vocational Rehab Evaluation & Intervention   Assessment shows need for Vocational Rehabilitation No   retired            Education: Education Goals: Education classes will be provided on a variety of topics geared toward better understanding of heart health and risk factor modification. Participant will state understanding/return demonstration of topics presented as noted by education test scores.  Learning Barriers/Preferences:  Learning Barriers/Preferences - 02/28/22 0956       Learning Barriers/Preferences   Learning Barriers Hearing    Learning Preferences None             General Cardiac Education Topics:  AED/CPR: - Group verbal and written instruction with the use of models to demonstrate  the basic use of the AED with the basic ABC's of resuscitation.   Anatomy and Cardiac Procedures: - Group verbal and visual presentation and models provide information about basic cardiac anatomy and function. Reviews the testing methods done to diagnose heart disease and the outcomes of the test results. Describes the treatment choices: Medical Management, Angioplasty, or Coronary Bypass Surgery for treating various heart conditions including Myocardial Infarction, Angina, Valve Disease, and Cardiac Arrhythmias.  Written material given at graduation. Flowsheet Row Cardiac Rehab from 05/22/2022 in [facility] Cardiac and Pulmonary Rehab  Date 05/15/22  Educator [facility]  Instruction Review Code 1- Verbalizes Understanding       Medication Safety: - Group verbal and visual instruction to review commonly prescribed medications for heart and lung disease. Reviews the medication,  class of the drug, and side effects. Includes the steps to properly store meds and maintain the prescription regimen.  Written material given at graduation. Flowsheet Row Cardiac Rehab from 05/22/2022 in [facility] Cardiac and Pulmonary Rehab  Date 05/22/22  Educator SB  Instruction Review Code 1- Verbalizes Understanding       Intimacy: - Group verbal instruction through game format to discuss how heart and lung disease can affect sexual intimacy. Written material given at graduation.. Flowsheet Row Cardiac Rehab from 05/08/2022 in [facility] Cardiac and Pulmonary Rehab  Date 04/17/22  Educator [facility]  Instruction Review Code 1- Verbalizes Understanding       Know Your Numbers and Heart Failure: - Group verbal and visual instruction to discuss disease risk factors for cardiac and pulmonary disease and treatment options.  Reviews associated critical values for Overweight/Obesity, Hypertension, Cholesterol, and Diabetes.  Discusses basics of heart failure: signs/symptoms and treatments.  Introduces Heart Failure Zone chart for action plan for heart failure.  Written material given at graduation. Flowsheet Row Cardiac Rehab from 05/08/2022 in [facility] Cardiac and Pulmonary Rehab  Education need identified 03/11/22  Date 03/20/22  Educator SB  Instruction Review Code 1- Verbalizes Understanding       Infection Prevention: - Provides verbal and written material to individual with discussion of infection control including proper hand washing and proper equipment cleaning during exercise session. Flowsheet Row Cardiac Rehab from 05/08/2022 in [facility] Cardiac and Pulmonary Rehab  Date 02/28/22  Educator [facility]  Instruction Review Code 1- Verbalizes Understanding       Falls Prevention: - Provides verbal and written material to individual with discussion of falls prevention and safety. Flowsheet Row Cardiac Rehab from 05/08/2022 in [facility] Cardiac and Pulmonary Rehab  Date 02/28/22  Educator [facility]  Instruction  Review Code 1- Verbalizes Understanding       Other: -Provides group and verbal instruction on various topics (see comments)   Knowledge Questionnaire Score:  Knowledge Questionnaire Score - 05/20/22 1109       Knowledge Questionnaire Score   Post Score 23/26             Core Components/Risk Factors/Patient Goals at Admission:  Personal Goals and Risk Factors at Admission - 03/11/22 1228       Core Components/Risk Factors/Patient Goals on Admission    Weight Management Yes;Weight Loss    Intervention Weight Management: Develop a combined nutrition and exercise program designed to reach desired caloric intake, while maintaining appropriate intake of nutrient and fiber, sodium and fats, and appropriate energy expenditure required for the weight goal.;Weight Management: Provide education and appropriate resources to help participant work on and attain dietary goals.;Weight Management/Obesity: Establish reasonable short term and long term weight goals.  Admit Weight 227 lb 12.8 oz (103.3 kg)    Goal Weight: Short Term 222 lb (100.7 kg)    Goal Weight: Long Term 215 lb (97.5 kg)    Expected Outcomes Short Term: Continue to assess and modify interventions until short term weight is achieved;Long Term: Adherence to nutrition and physical activity/exercise program aimed toward attainment of established weight goal;Weight Loss: Understanding of general recommendations for a balanced deficit meal plan, which promotes 1-2 lb weight loss per week and includes a negative energy balance of [phone] kcal/d;Understanding recommendations for meals to include 15-35% energy as protein, 25-35% energy from fat, 35-60% energy from carbohydrates, less than '200mg'$  of dietary cholesterol, 20-35 gm of total fiber daily;Understanding of distribution of calorie intake throughout the day with the consumption of 4-5 meals/snacks    Diabetes Yes    Intervention Provide education about signs/symptoms and action  to take for hypo/hyperglycemia.;Provide education about proper nutrition, including hydration, and aerobic/resistive exercise prescription along with prescribed medications to achieve blood glucose in normal ranges: Fasting glucose 65-99 mg/dL    Expected Outcomes Short Term: Participant verbalizes understanding of the signs/symptoms and immediate care of hyper/hypoglycemia, proper foot care and importance of medication, aerobic/resistive exercise and nutrition plan for blood glucose control.;Long Term: Attainment of HbA1C < 7%.    Heart Failure Yes    Intervention Provide a combined exercise and nutrition program that is supplemented with education, support and counseling about heart failure. Directed toward relieving symptoms such as shortness of breath, decreased exercise tolerance, and extremity edema.    Expected Outcomes Improve functional capacity of life;Short term: Attendance in program 2-3 days a week with increased exercise capacity. Reported lower sodium intake. Reported increased fruit and vegetable intake. Reports medication compliance.;Long term: Adoption of self-care skills and reduction of barriers for early signs and symptoms recognition and intervention leading to self-care maintenance.;Short term: Daily weights obtained and reported for increase. Utilizing diuretic protocols set by physician.    Hypertension Yes    Intervention Provide education on lifestyle modifcations including regular physical activity/exercise, weight management, moderate sodium restriction and increased consumption of fresh fruit, vegetables, and low fat dairy, alcohol moderation, and smoking cessation.;Monitor prescription use compliance.    Expected Outcomes Short Term: Continued assessment and intervention until BP is < 140/94m HG in hypertensive participants. < 130/88mHG in hypertensive participants with diabetes, heart failure or chronic kidney disease.;Long Term: Maintenance of blood pressure at goal levels.     Lipids Yes    Intervention Provide education and support for participant on nutrition & aerobic/resistive exercise along with prescribed medications to achieve LDL '70mg'$ , HDL >'40mg'$ .    Expected Outcomes Short Term: Participant states understanding of desired cholesterol values and is compliant with medications prescribed. Participant is following exercise prescription and nutrition guidelines.;Long Term: Cholesterol controlled with medications as prescribed, with individualized exercise RX and with personalized nutrition plan. Value goals: LDL < '70mg'$ , HDL > 40 mg.             Education:Diabetes - Individual verbal and written instruction to review signs/symptoms of diabetes, desired ranges of glucose level fasting, after meals and with exercise. Acknowledge that pre and post exercise glucose checks will be done for 3 sessions at entry of program. [place] [facility]  Date 02/28/22  [facility] Code 1- Verbalizes Understanding       Core Components/Risk Factors/Patient Goals Review:   Goals and Risk Factor Review  [place] Name 03/27/22 0800 04/19/22 0755 05/17/22 0833         Core Components/Risk Factors/Patient Goals Review   Personal Goals Review Weight Management/Obesity;Heart Failure;Hypertension;Diabetes;Lipids Weight Management/Obesity;Hypertension Weight Management/Obesity;Hypertension;Diabetes     Review Timmothy Sours is doing well in rehab.  He is down on his weight with taking his fluid pill.  His leg swelling has also gone down and he feels less short of breath.  His sugars are doing well and he is checking them at home with his brother helping him.  His blood pressures are also doing well.  He notes that he has had some chest pain recently but has not told his doctor about them yet. Timmothy Sours wants to lose weight but has sinced gained weight since the start of the program. He was 227 pounds and is up to 234  pounds. He states that his portionsa re to big and wants to cut back. His blood pressure has been a little better since the start of the program. BP has gone down by about 10 points since the start of the program. Timmothy Sours has done well in rehab. His weight is steady now.  His pressues are doing well and he continues to montior them at home too.  His sugars are doing well and he has not had any major swings.     Expected Outcomes Short: Continue to work on weight loss and let doctor know about chest pain Long; Continue to monitor risk factors Short: lose 5 pounds in the next couple weeks. Long: reach weight goal. Continue to montior risk factors              Core Components/Risk Factors/Patient Goals at Discharge (Final Review):   Goals and Risk Factor Review - 05/17/22 0833       Core Components/Risk Factors/Patient Goals Review   Personal Goals Review Weight Management/Obesity;Hypertension;Diabetes    Review Timmothy Sours has done well in rehab. His weight is steady now.  His pressues are doing well and he continues to montior them at home too.  His sugars are doing well and he has not had any major swings.    Expected Outcomes Continue to montior risk factors             ITP Comments:  ITP Comments     [place] Name 02/28/22 1024 03/11/22 1210 03/13/22 0651 03/13/22 0825 04/10/22 0908   ITP Comments Virtual Visit completed. Patient informed on EP and RD appointment and 6 Minute walk test. Patient also informed of patient health questionnaires on My Chart. Patient Verbalizes understanding. Visit diagnosis can be found in [facility] 12/10/2021. Completed 6MWT and gym orientation. Initial ITP created and sent for review to Dr. Emily Filbert, Medical Director. 30 Day review completed. Medical Director ITP review done, changes made as directed, and signed approval by Medical Director.   New to program First full day of exercise!  Patient was oriented to gym and equipment including functions, settings, policies, and  procedures.  Patient's individual exercise prescription and treatment plan were reviewed.  All starting workloads were established based on the results of the 6 minute walk test done at initial orientation visit.  The plan for exercise progression was also introduced and progression will be customized based on patient's performance and goals. 30 Day review completed. Medical Director ITP review done, changes made as directed, and signed approval by Medical Director.    [place] Name 05/08/22 0737 06/05/22 0947 06/06/22 0908 06/14/22 0759     ITP Comments 30 Day review completed.  Medical Director ITP review done, changes made as directed, and signed approval by Medical Director. 30 Day review completed. Medical Director ITP review done, changes made as directed, and signed approval by Medical Director. Timmothy Sours has not attended since 05/29/22. He still has two sessions left. Left message on his brother's voicemail. Sent discharge folder to patient today.             Comments: Discharge ITP

## 2022-06-14 NOTE — Progress Notes (Signed)
Discharge Summary: Shane Holloway 1951/02/10   [name] graduated today from  rehab with 34 sessions completed.  Details of the patient's exercise prescription and what he  needs to do in order to continue the prescription and progress were discussed with patient.  Patient was given a copy of prescription and goals.  Patient verbalized understanding. Don plans to continue to exercise by walk at home.   [place] Name 03/11/22 1211 05/17/22 0825       6 Minute Walk   Phase Initial Discharge    Distance 860 feet 1076 feet    Distance % Change -- 25.1 %    Distance Feet Change -- 216 ft    Walk Time 6 minutes 6 minutes    # of Rest Breaks 0 0    MPH 1.63 2.04    METS 1.85 2.47    RPE 9 121    Perceived Dyspnea  1 --    VO2 Peak 6.47 8.63    Symptoms Yes (comment) No    Comments SOB, leg pain 5/10 --    Resting HR 80 bpm 61 bpm    Resting BP 114/62 124/74    Resting Oxygen Saturation  96 % 95 %    Exercise Oxygen Saturation  during 6 min walk 96 % 96 %    Max Ex. HR 95 bpm 113 bpm    Max Ex. BP 126/74 142/76    2 Minute Post BP 122/70 --

## 2022-07-02 NOTE — Progress Notes (Signed)
Flu like symtpms.

## 2022-09-14 ENCOUNTER — Emergency Department (HOSPITAL_COMMUNITY): Payer: Medicare HMO

## 2022-09-14 ENCOUNTER — Other Ambulatory Visit: Payer: Self-pay

## 2022-09-14 ENCOUNTER — Emergency Department (HOSPITAL_COMMUNITY)
Admission: EM | Admit: 2022-09-14 | Discharge: 2022-09-14 | Disposition: A | Discharge status: Home / Self Care | Payer: Medicare HMO | Attending: Emergency Medicine | Admitting: Emergency Medicine

## 2022-09-14 ENCOUNTER — Encounter (HOSPITAL_COMMUNITY): Payer: Self-pay

## 2022-09-14 DIAGNOSIS — Z79899 Other long term (current) drug therapy: Secondary | ICD-10-CM | POA: Insufficient documentation

## 2022-09-14 DIAGNOSIS — R519 Headache, unspecified: Secondary | ICD-10-CM | POA: Insufficient documentation

## 2022-09-14 DIAGNOSIS — I2510 Atherosclerotic heart disease of native coronary artery without angina pectoris: Secondary | ICD-10-CM | POA: Diagnosis not present

## 2022-09-14 DIAGNOSIS — R079 Chest pain, unspecified: Secondary | ICD-10-CM | POA: Diagnosis present

## 2022-09-14 DIAGNOSIS — E119 Type 2 diabetes mellitus without complications: Secondary | ICD-10-CM | POA: Insufficient documentation

## 2022-09-14 DIAGNOSIS — N183 Chronic kidney disease, stage 3 unspecified: Secondary | ICD-10-CM | POA: Insufficient documentation

## 2022-09-14 DIAGNOSIS — R350 Frequency of micturition: Secondary | ICD-10-CM | POA: Insufficient documentation

## 2022-09-14 DIAGNOSIS — I130 Hypertensive heart and chronic kidney disease with heart failure and stage 1 through stage 4 chronic kidney disease, or unspecified chronic kidney disease: Secondary | ICD-10-CM | POA: Insufficient documentation

## 2022-09-14 DIAGNOSIS — Z955 Presence of coronary angioplasty implant and graft: Secondary | ICD-10-CM | POA: Diagnosis not present

## 2022-09-14 DIAGNOSIS — Z7984 Long term (current) use of oral hypoglycemic drugs: Secondary | ICD-10-CM | POA: Insufficient documentation

## 2022-09-14 DIAGNOSIS — Z7901 Long term (current) use of anticoagulants: Secondary | ICD-10-CM | POA: Diagnosis not present

## 2022-09-14 DIAGNOSIS — I509 Heart failure, unspecified: Secondary | ICD-10-CM | POA: Insufficient documentation

## 2022-09-14 LAB — CBC
HCT: 44.2 % (ref 39.0–52.0)
Hemoglobin: 15.3 g/dL (ref 13.0–17.0)
MCH: 29.7 pg (ref 26.0–34.0)
MCHC: 34.6 g/dL (ref 30.0–36.0)
MCV: 85.7 fL (ref 80.0–100.0)
Platelets: 122 10*3/uL — ABNORMAL LOW (ref 150–400)
RBC: 5.16 MIL/uL (ref 4.22–5.81)
RDW: 13.5 % (ref 11.5–15.5)
WBC: 6.8 10*3/uL (ref 4.0–10.5)
nRBC: 0 % (ref 0.0–0.2)

## 2022-09-14 LAB — BASIC METABOLIC PANEL
Anion gap: 9 (ref 5–15)
BUN: 43 mg/dL — ABNORMAL HIGH (ref 8–23)
CO2: 23 mmol/L (ref 22–32)
Calcium: 9.2 mg/dL (ref 8.9–10.3)
Chloride: 103 mmol/L (ref 98–111)
Creatinine, Ser: 2.85 mg/dL — ABNORMAL HIGH (ref 0.61–1.24)
GFR, Estimated: 23 mL/min — ABNORMAL LOW (ref >60)
Glucose, Bld: 238 mg/dL — ABNORMAL HIGH (ref 70–99)
Potassium: 3.7 mmol/L (ref 3.5–5.1)
Sodium: 135 mmol/L (ref 135–145)

## 2022-09-14 LAB — BRAIN NATRIURETIC PEPTIDE: B Natriuretic Peptide: 88.4 pg/mL (ref 0.0–100.0)

## 2022-09-14 LAB — TROPONIN I (HIGH SENSITIVITY)
Troponin I (High Sensitivity): 19 ng/L — ABNORMAL HIGH (ref <18)
Troponin I (High Sensitivity): 17 ng/L (ref <18)

## 2022-09-14 NOTE — ED Provider Notes (Signed)
[place] EMERGENCY DEPARTMENT AT [facility] Provider Note   CSN: [id number] Arrival date & time: 09/14/22  1741     History  Chief Complaint  Patient presents with   Chest Pain    Shane Holloway is a 72 y.o. male with PMH of T2DM, HTN, CAD s/p CABG and stent placement on Eliquis, CHF last EF was 35 to 40% in July 2023, CVA, CKD stage III presenting with worsening intermittent sharp chest pain over the past 3 days that is increased in frequency as well as severity.  Endorses headache with this. Denies shortness of breath, abdominal pain, nausea, vomiting, increased leg swelling. Patient is unable to specify whether this pain feels like his previous heart attacks.  He states that he cannot associate it with any particular triggers.  He denies recent illness, fever. States he took a nitro at home, which alleviated his pain.  =   Chest Pain      Home Medications Prior to Admission medications   Medication Sig Start Date End Date Taking? Authorizing Provider  amiodarone (PACERONE) 200 MG tablet Take 1 tablet (200 mg total) by mouth daily. 03/21/21   Azucena Fallen, MD  amiodarone (PACERONE) 200 MG tablet Take 1 tablet by mouth daily. 04/17/21 04/17/22  [provider]  amLODipine (NORVASC) 2.5 MG tablet Take 2.5 mg by mouth daily. Patient not taking: Reported on 02/28/2022    [provider]  apixaban (ELIQUIS) 5 MG TABS tablet Take by mouth. 03/28/21 03/23/22  [provider]  atorvastatin (LIPITOR) 80 MG tablet Take 80 mg by mouth daily. 12/12/20   [provider]  benzonatate (TESSALON PERLES) 100 MG capsule Take 1 capsule (100 mg total) by mouth 3 (three) times daily as needed for cough. Patient not taking: Reported on 02/28/2022 05/14/21   Andrey Campanile, MD  Cholecalciferol 125 MCG (5000 UT) TABS Take 1 tablet by mouth daily.    [provider]  cyclobenzaprine (FLEXERIL) 5 MG tablet Take 5 mg by mouth 2 (two)  times daily. Patient not taking: Reported on 02/28/2022 12/10/21   [provider]  ELIQUIS 5 MG TABS tablet Take 5 mg by mouth 2 (two) times daily. 03/09/21   [provider]  empagliflozin (JARDIANCE) 10 MG TABS tablet Take 1 tablet by mouth daily. 08/08/21   [provider]  finasteride (PROSCAR) 5 MG tablet Take 5 mg by mouth daily. Patient not taking: Reported on 02/28/2022    [provider]  furosemide (LASIX) 20 MG tablet Take 1 tablet (20 mg total) by mouth daily. 09/15/11 12/10/21  Elvina Sidle, MD  glipiZIDE (GLUCOTROL XL) 10 MG 24 hr tablet Take 10 mg by mouth daily. 11/14/21   [provider]  glipiZIDE (GLUCOTROL XL) 10 MG 24 hr tablet Take 1 tablet by mouth daily. Patient not taking: Reported on 02/28/2022 04/27/21   [provider]  JARDIANCE 10 MG TABS tablet Take 10 mg by mouth daily. 03/09/21   [provider]  losartan (COZAAR) 100 MG tablet Take 100 mg by mouth daily. Patient not taking: Reported on 02/28/2022 11/29/21   [provider]  losartan (COZAAR) 100 MG tablet Take 1 tablet by mouth daily. 11/29/21 11/29/22  [provider]  metoprolol tartrate (LOPRESSOR) 50 MG tablet Take 1 tablet (50 mg total) by mouth 2 (two) times daily. Patient not taking: Reported on 02/28/2022 03/21/21   Azucena Fallen, MD  metoprolol tartrate (LOPRESSOR) 50 MG tablet Take by mouth. 04/17/21  04/17/22  [provider]  nitroGLYCERIN (NITROSTAT) 0.4 MG SL tablet Place under the tongue. 01/23/22   [provider]  Tamsulosin HCl (FLOMAX) 0.4 MG CAPS TAKE ONE CAPSULE BY MOUTH EVERY DAY Patient not taking: Reported on 02/28/2022 12/27/11   Porfirio Oar, PA      Allergies    Ramipril, Shellfish allergy, and Sulfa antibiotics    Review of Systems   Review of Systems  Cardiovascular:  Positive for chest pain.    Physical Exam Updated Vital Signs BP (!) 151/107 (BP Location: Left Arm)   Pulse 62   Temp  (!) 97.4 F (36.3 C) (Oral)   Resp 18   Ht 6\' 1"  (1.854 m)   Wt 107 kg   SpO2 94%   BMI 31.12 kg/m  Physical Exam Vitals and nursing note reviewed.  Constitutional:      General: He is not in acute distress.    Appearance: He is well-developed.  HENT:     Head: Normocephalic and atraumatic.  Eyes:     Extraocular Movements: Extraocular movements intact.     Conjunctiva/sclera: Conjunctivae normal.     Pupils: Pupils are equal, round, and reactive to light.  Neck:     Vascular: No JVD.  Cardiovascular:     Rate and Rhythm: Normal rate and regular rhythm.     Pulses:          Radial pulses are 2+ on the right side and 2+ on the left side.       Dorsalis pedis pulses are 2+ on the right side and 2+ on the left side.     Heart sounds: Normal heart sounds. No murmur heard. Pulmonary:     Effort: Pulmonary effort is normal. No respiratory distress.     Breath sounds: Normal breath sounds.  Chest:     Chest wall: No tenderness.  Abdominal:     Palpations: Abdomen is soft.     Tenderness: There is no abdominal tenderness. There is no guarding or rebound.  Musculoskeletal:        General: No swelling.     Cervical back: Neck supple.     Right lower leg: No tenderness. Edema present.     Left lower leg: No tenderness. Edema present.  Skin:    General: Skin is warm and dry.     Capillary Refill: Capillary refill takes less than 2 seconds.     Findings: No rash.  Neurological:     Mental Status: He is alert and oriented to person, place, and time.  Psychiatric:        Mood and Affect: Mood normal.     ED Results / Procedures / Treatments   Labs (all labs ordered are listed, but only abnormal results are displayed) Labs Reviewed  BASIC METABOLIC PANEL - Abnormal; Notable for the following components:      Result Value   Glucose, Bld 238 (*)    BUN 43 (*)    Creatinine, Ser 2.85 (*)    GFR, Estimated 23 (*)    All other components within normal limits  CBC - Abnormal;  Notable for the following components:   Platelets 122 (*)    All other components within normal limits  TROPONIN I (HIGH SENSITIVITY) - Abnormal; Notable for the following components:   Troponin I (High Sensitivity) 19 (*)    All other components within normal limits  BRAIN NATRIURETIC PEPTIDE  TROPONIN I (HIGH SENSITIVITY)    EKG EKG Interpretation  Date/Time:  Saturday September 14 2022 18:55:06 EDT Ventricular Rate:  64 PR Interval:  224 QRS Duration: 130 QT Interval:  475 QTC Calculation: 491 R Axis:   14 Text Interpretation: Sinus rhythm Prolonged PR interval Nonspecific intraventricular conduction delay T wave abnormality Artifact Abnormal ECG Confirmed by Gerhard Munch [phone]) on 09/14/2022 7:57:34 PM  Radiology DG Chest 2 View  Result Date: 09/14/2022 CLINICAL DATA:  Chest pain EXAM: CHEST - 2 VIEW COMPARISON:  Chest x-ray 06/05/2022 FINDINGS: Sternotomy wires and mediastinal clips are again seen. Heart bowel replacement again noted. The heart size and mediastinal contours are within normal limits. Both lungs are clear. The visualized skeletal structures are unremarkable. IMPRESSION: No active cardiopulmonary disease. Electronically Signed   By: Darliss Cheney M.D.   On: 09/14/2022 18:39    Medications Ordered in ED Medications - No data to display  ED Course/ Medical Decision Making/ A&P             HEART Score: 8                Medical Decision Making Amount and/or Complexity of Data Reviewed Labs: ordered. Radiology: ordered.   Patient presents hemodynamically stable.  On exam he has no focal abnormal lung sounds, no abdominal tenderness.  Patient has bilateral lower extremity edema, but he states this is his baseline.  Pulses are all equal throughout.  Labs significant for hyperglycemia and elevated creatinine, but it appears to be at his baseline.  BNP is WNL.  Troponin is elevated at 19, but not notably increased from previous and delta troponin decreased to 17.  No  leukocytosis or significant anemia, no electrolyte abnormalities.  Chest x-ray negative for focal consolidation, pneumothorax, pleural effusion or pulmonary edema.  EKG shows sinus rhythm, he does have slight ST depressions in lateral leads, which were present on previous from December 2023.  No other acute signs of ischemia at this time.  Through shared decision making conversation with patient as he has remained chest pain-free during his emergency department visit and has consistent cardiology follow-up, patient felt safe going home and scheduling follow-up with his cardiologist.  He was deemed safe for discharge and given return precautions.         Final Clinical Impression(s) / ED Diagnoses Final diagnoses:  Chest pain, unspecified type     Maryruth Eve, MD 09/15/22 2130    Gerhard Munch, MD 09/18/22 [phone]

## 2022-09-14 NOTE — Discharge Instructions (Signed)
Your labs overall reassuring including the troponins, which were used to evaluate the heart.  Your EKG did not show any changes from your previous.  And your chest x-ray did not show any acute findings.  After shared decision making, you feel safe going home and following up with your cardiologist.  As your chest pain has resolved and you have had no further episodes while in the emergency department we feel safe sending you home as well knowing that you will follow-up with your cardiologist. However, should you have any more concerning symptoms worsening of your chest pain, difficulty breathing, dizziness or fainting, persistent nausea or vomiting please return to the emergency department.

## 2022-09-14 NOTE — ED Notes (Signed)
Pt hooked up to monitor.

## 2022-09-14 NOTE — ED Triage Notes (Addendum)
Pt came in via P[facility] w c/o of chest pain. Pt has had 3 heart attacks and 3 strokes previously.Chest pain started 3 days ago, it comes and goes.Pt is also complaining of dizziness and SOB at times.Pt does takr blood thinners

## 2022-10-15 ENCOUNTER — Emergency Department (HOSPITAL_COMMUNITY)
Admission: EM | Admit: 2022-10-15 | Discharge: 2022-10-15 | Discharge status: Against Medical Advice | Payer: Medicare HMO | Attending: Emergency Medicine | Admitting: Emergency Medicine

## 2022-10-15 ENCOUNTER — Emergency Department (HOSPITAL_COMMUNITY): Payer: Medicare HMO

## 2022-10-15 ENCOUNTER — Other Ambulatory Visit: Payer: Self-pay

## 2022-10-15 ENCOUNTER — Encounter (HOSPITAL_COMMUNITY): Payer: Self-pay

## 2022-10-15 DIAGNOSIS — E1122 Type 2 diabetes mellitus with diabetic chronic kidney disease: Secondary | ICD-10-CM | POA: Insufficient documentation

## 2022-10-15 DIAGNOSIS — I130 Hypertensive heart and chronic kidney disease with heart failure and stage 1 through stage 4 chronic kidney disease, or unspecified chronic kidney disease: Secondary | ICD-10-CM | POA: Insufficient documentation

## 2022-10-15 DIAGNOSIS — Z79899 Other long term (current) drug therapy: Secondary | ICD-10-CM | POA: Insufficient documentation

## 2022-10-15 DIAGNOSIS — I509 Heart failure, unspecified: Secondary | ICD-10-CM | POA: Diagnosis not present

## 2022-10-15 DIAGNOSIS — N189 Chronic kidney disease, unspecified: Secondary | ICD-10-CM | POA: Insufficient documentation

## 2022-10-15 DIAGNOSIS — E1165 Type 2 diabetes mellitus with hyperglycemia: Secondary | ICD-10-CM | POA: Diagnosis not present

## 2022-10-15 DIAGNOSIS — Z5321 Procedure and treatment not carried out due to patient leaving prior to being seen by health care provider: Secondary | ICD-10-CM

## 2022-10-15 DIAGNOSIS — U071 COVID-19: Secondary | ICD-10-CM | POA: Diagnosis not present

## 2022-10-15 DIAGNOSIS — Z7984 Long term (current) use of oral hypoglycemic drugs: Secondary | ICD-10-CM | POA: Insufficient documentation

## 2022-10-15 DIAGNOSIS — Z5329 Procedure and treatment not carried out because of patient's decision for other reasons: Secondary | ICD-10-CM | POA: Insufficient documentation

## 2022-10-15 DIAGNOSIS — R059 Cough, unspecified: Secondary | ICD-10-CM | POA: Diagnosis present

## 2022-10-15 DIAGNOSIS — Z7901 Long term (current) use of anticoagulants: Secondary | ICD-10-CM | POA: Insufficient documentation

## 2022-10-15 HISTORY — DX: COVID-19: U07.1

## 2022-10-15 LAB — COMPREHENSIVE METABOLIC PANEL
ALT: 26 U/L (ref 0–44)
AST: 21 U/L (ref 15–41)
Albumin: 3.7 g/dL (ref 3.5–5.0)
Alkaline Phosphatase: 108 U/L (ref 38–126)
Anion gap: 13 (ref 5–15)
BUN: 35 mg/dL — ABNORMAL HIGH (ref 8–23)
CO2: 21 mmol/L — ABNORMAL LOW (ref 22–32)
Calcium: 9.3 mg/dL (ref 8.9–10.3)
Chloride: 106 mmol/L (ref 98–111)
Creatinine, Ser: 2.89 mg/dL — ABNORMAL HIGH (ref 0.61–1.24)
GFR, Estimated: 22 mL/min — ABNORMAL LOW (ref >60)
Glucose, Bld: 203 mg/dL — ABNORMAL HIGH (ref 70–99)
Potassium: 3.8 mmol/L (ref 3.5–5.1)
Sodium: 140 mmol/L (ref 135–145)
Total Bilirubin: 1.2 mg/dL (ref 0.3–1.2)
Total Protein: 6.4 g/dL — ABNORMAL LOW (ref 6.5–8.1)

## 2022-10-15 LAB — CBC WITH DIFFERENTIAL/PLATELET
Abs Immature Granulocytes: 0.02 10*3/uL (ref 0.00–0.07)
Basophils Absolute: 0.1 10*3/uL (ref 0.0–0.1)
Basophils Relative: 1 %
Eosinophils Absolute: 0.3 10*3/uL (ref 0.0–0.5)
Eosinophils Relative: 5 %
HCT: 45.3 % (ref 39.0–52.0)
Hemoglobin: 15.1 g/dL (ref 13.0–17.0)
Immature Granulocytes: 0 %
Lymphocytes Relative: 18 %
Lymphs Abs: 1.2 10*3/uL (ref 0.7–4.0)
MCH: 29.2 pg (ref 26.0–34.0)
MCHC: 33.3 g/dL (ref 30.0–36.0)
MCV: 87.5 fL (ref 80.0–100.0)
Monocytes Absolute: 0.8 10*3/uL (ref 0.1–1.0)
Monocytes Relative: 12 %
Neutro Abs: 4.3 10*3/uL (ref 1.7–7.7)
Neutrophils Relative %: 64 %
Platelets: 121 10*3/uL — ABNORMAL LOW (ref 150–400)
RBC: 5.18 MIL/uL (ref 4.22–5.81)
RDW: 13.5 % (ref 11.5–15.5)
WBC: 6.6 10*3/uL (ref 4.0–10.5)
nRBC: 0 % (ref 0.0–0.2)

## 2022-10-15 LAB — URINALYSIS, ROUTINE W REFLEX MICROSCOPIC
Bilirubin Urine: NEGATIVE
Glucose, UA: >=500 mg/dL — AB
Ketones, ur: NEGATIVE mg/dL
Nitrite: NEGATIVE
Protein, ur: 100 mg/dL — AB
Specific Gravity, Urine: 1.016 (ref 1.005–1.030)
WBC, UA: >50 WBC/hpf (ref 0–5)
pH: 5 (ref 5.0–8.0)

## 2022-10-15 LAB — SARS CORONAVIRUS 2 BY RT PCR: SARS Coronavirus 2 by RT PCR: POSITIVE — AB

## 2022-10-15 LAB — LIPASE, BLOOD: Lipase: 33 U/L (ref 11–51)

## 2022-10-15 NOTE — ED Notes (Addendum)
Pt left AMA. Glynis Smiles PA notified

## 2022-10-15 NOTE — ED Triage Notes (Signed)
Pt c/o vomiting, productive coughx3d. Pt states he tested COVID positive two days ago.

## 2022-10-15 NOTE — ED Provider Notes (Signed)
[place] EMERGENCY DEPARTMENT AT [facility] Provider Note   CSN: [id number] Arrival date & time: 10/15/22  1805     History  Chief Complaint  Patient presents with   Covid Positive    Shane Holloway is a 72 y.o. male with medical history of stroke in 2022, MI in 2004, BPH, CHF, CKD, diabetes, hypertension.  Patient presents to ED for evaluation of COVID-positive diagnosis.  Patient states that on Sunday he developed cough, body aches and chills, sore throat.  Patient reports that he was on the way back from [place] when this occurred.  Patient states that he took a at home COVID test which was positive.  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  The patient denies any fevers, diarrhea, abdominal pain, chest pain, shortness of breath at home.  He reports that he has been taking cold medication for his symptoms but he cannot tell me what specific cold medication he has been taking.  The patient reports that he presented for evaluation today due to his past medical history of strokes and heart attacks.  He denies any chest pain or shortness of breath.  He states he is thrown up 3 times in the last 2 days.  HPI     Home Medications Prior to Admission medications   Medication Sig Start Date End Date Taking? Authorizing Provider  amiodarone (PACERONE) 200 MG tablet Take 1 tablet (200 mg total) by mouth daily. 03/21/21   Azucena Fallen, MD  amiodarone (PACERONE) 200 MG tablet Take 1 tablet by mouth daily. 04/17/21 04/17/22  [provider]  amLODipine (NORVASC) 2.5 MG tablet Take 2.5 mg by mouth daily. Patient not taking: Reported on 02/28/2022    [provider]  apixaban (ELIQUIS) 5 MG TABS tablet Take by mouth. 03/28/21 03/23/22  [provider]  atorvastatin (LIPITOR) 80 MG tablet Take 80 mg by mouth daily. 12/12/20   [provider]  benzonatate (TESSALON PERLES) 100 MG capsule Take 1 capsule (100 mg total) by mouth 3  (three) times daily as needed for cough. Patient not taking: Reported on 02/28/2022 05/14/21   Andrey Campanile, MD  Cholecalciferol 125 MCG (5000 UT) TABS Take 1 tablet by mouth daily.    [provider]  cyclobenzaprine (FLEXERIL) 5 MG tablet Take 5 mg by mouth 2 (two) times daily. Patient not taking: Reported on 02/28/2022 12/10/21   [provider]  ELIQUIS 5 MG TABS tablet Take 5 mg by mouth 2 (two) times daily. 03/09/21   [provider]  empagliflozin (JARDIANCE) 10 MG TABS tablet Take 1 tablet by mouth daily. 08/08/21   [provider]  finasteride (PROSCAR) 5 MG tablet Take 5 mg by mouth daily. Patient not taking: Reported on 02/28/2022    [provider]  furosemide (LASIX) 20 MG tablet Take 1 tablet (20 mg total) by mouth daily. 09/15/11 12/10/21  Elvina Sidle, MD  glipiZIDE (GLUCOTROL XL) 10 MG 24 hr tablet Take 10 mg by mouth daily. 11/14/21   [provider]  glipiZIDE (GLUCOTROL XL) 10 MG 24 hr tablet Take 1 tablet by mouth daily. Patient not taking: Reported on 02/28/2022 04/27/21   [provider]  JARDIANCE 10 MG TABS tablet Take 10 mg by mouth daily. 03/09/21   [provider]  losartan (COZAAR) 100 MG tablet Take 100 mg by mouth daily. Patient not taking: Reported on 02/28/2022 11/29/21   [provider]  losartan (COZAAR) 100 MG tablet  Take 1 tablet by mouth daily. 11/29/21 11/29/22  [provider]  metoprolol tartrate (LOPRESSOR) 50 MG tablet Take 1 tablet (50 mg total) by mouth 2 (two) times daily. Patient not taking: Reported on 02/28/2022 03/21/21   Azucena Fallen, MD  metoprolol tartrate (LOPRESSOR) 50 MG tablet Take by mouth. 04/17/21 04/17/22  [provider]  nitroGLYCERIN (NITROSTAT) 0.4 MG SL tablet Place under the tongue. 01/23/22   [provider]  Tamsulosin HCl (FLOMAX) 0.4 MG CAPS TAKE ONE CAPSULE BY MOUTH EVERY DAY Patient not taking: Reported on 02/28/2022  12/27/11   Porfirio Oar, PA      Allergies    Ramipril, Shellfish allergy, and Sulfa antibiotics    Review of Systems   Review of Systems  Constitutional:  Positive for chills.  HENT:  Positive for sore throat.   Respiratory:  Positive for cough.   Musculoskeletal:  Positive for myalgias.  All other systems reviewed and are negative.   Physical Exam Updated Vital Signs BP (!) 166/110 (BP Location: Right Arm)   Pulse 75   Temp 98.9 F (37.2 C) (Oral)   Resp 13   Ht 6\' 1"  (1.854 m)   Wt 107 kg   SpO2 100%   BMI 31.12 kg/m  Physical Exam Vitals and nursing note reviewed.  Constitutional:      General: He is not in acute distress.    Appearance: Normal appearance. He is not ill-appearing, toxic-appearing or diaphoretic.  HENT:     Head: Normocephalic and atraumatic.     Nose: Nose normal.     Mouth/Throat:     Mouth: Mucous membranes are moist.     Pharynx: Oropharynx is clear.  Eyes:     Extraocular Movements: Extraocular movements intact.     Conjunctiva/sclera: Conjunctivae normal.     Pupils: Pupils are equal, round, and reactive to light.  Cardiovascular:     Rate and Rhythm: Normal rate and regular rhythm.  Pulmonary:     Effort: Pulmonary effort is normal.     Breath sounds: Normal breath sounds. No wheezing.  Abdominal:     General: Abdomen is flat. Bowel sounds are normal.     Palpations: Abdomen is soft.     Tenderness: There is no abdominal tenderness.  Musculoskeletal:     Cervical back: Normal range of motion and neck supple. No tenderness.  Skin:    General: Skin is warm and dry.     Capillary Refill: Capillary refill takes less than 2 seconds.  Neurological:     Mental Status: He is alert and oriented to person, place, and time.     ED Results / Procedures / Treatments   Labs (all labs ordered are listed, but only abnormal results are displayed) Labs Reviewed  SARS CORONAVIRUS 2 BY RT PCR - Abnormal; Notable for the following components:       Result Value   SARS Coronavirus 2 by RT PCR POSITIVE (*)    All other components within normal limits  COMPREHENSIVE METABOLIC PANEL - Abnormal; Notable for the following components:   CO2 21 (*)    Glucose, Bld 203 (*)    BUN 35 (*)    Creatinine, Ser 2.89 (*)    Total Protein 6.4 (*)    GFR, Estimated 22 (*)    All other components within normal limits  CBC WITH DIFFERENTIAL/PLATELET - Abnormal; Notable for the following components:   Platelets 121 (*)    All other components within normal limits  URINALYSIS, ROUTINE W REFLEX MICROSCOPIC - Abnormal; Notable for the following components:   APPearance HAZY (*)    Glucose, UA >=500 (*)    Hgb urine dipstick MODERATE (*)    Protein, ur 100 (*)    Leukocytes,Ua MODERATE (*)    Bacteria, UA FEW (*)    All other components within normal limits  URINE CULTURE  LIPASE, BLOOD    EKG EKG Interpretation  Date/Time:  Tuesday Oct 15 2022 19:16:52 EDT Ventricular Rate:  64 PR Interval:  154 QRS Duration: 117 QT Interval:  494 QTC Calculation: 510 R Axis:   82 Text Interpretation: Sinus or ectopic atrial rhythm Nonspecific intraventricular conduction delay Low voltage, extremity leads Nonspecific T abnormalities, lateral leads No significant change since last tracing Confirmed by Linwood Dibbles [phone]) on 10/15/2022 7:35:07 PM  Radiology DG Chest Portable 1 View  Result Date: 10/15/2022 CLINICAL DATA:  COVID positive with shortness of breath. EXAM: PORTABLE CHEST 1 VIEW COMPARISON:  Chest x-ray 09/14/2022 FINDINGS: Prosthetic heart valve, mediastinal clips and median sternotomy wires are again seen. The heart size and mediastinal contours are within normal limits. Both lungs are clear. The visualized skeletal structures are unremarkable. IMPRESSION: No active disease. Electronically Signed   By: Darliss Cheney M.D.   On: 10/15/2022 19:18    Procedures Procedures   Medications Ordered in ED Medications - No data to display  ED Course/  Medical Decision Making/ A&P  Medical Decision Making Amount and/or Complexity of Data Reviewed Labs: ordered. Radiology: ordered.   72 year old male presents to the ED for evaluation.  Please see HPI for further details.  On examination the patient is afebrile, nontachycardic.  His lung sounds are clear bilaterally, he is not hypoxic on room air.  His abdomen is soft and compressible throughout.  Neurological examination at baseline, he is alert and oriented.  Patient will be worked up utilizing following labs to include CBC, CMP, urinalysis, COVID testing, lipase.  These labs were ordered in triage.  I have added on chest x-ray as the patient is complaining of persistent cough, COVID-positive status.  CBC without leukocytosis or anemia.  CMP with glucose 203, baseline creatinine 2.8, BUN 35.  Urinalysis shows moderate leukocytes, bacteria, glucose.  The patient denies any dysuria however states that he has been diagnosed with UTIs in the past based on urinalysis.  Will plan to place patient on antibiotics for this.  Patient COVID test is positive here.  Patient chest x-ray unremarkable without consolidations or effusions.  Will ambulate this patient and ensure that he does not desaturate and become hypoxic.  Nursing staff has notified me that the patient has eloped from the department.  No one witnessed the patient eloping.  The patient is not in his room.  My plan was to have the patient placed on antibiotics for UTI, ambulate him to ensure there is no hypoxia present on ambulation.  Myself nor nursing staff was ever notified that the patient was leaving.  The patient has eloped from the department.  Patient urine will be cultured.  If the patient were to return, he will need to be ambulated to ensure there is no hypoxia.  I was also planning on possibly CT scan his chest to assess for underlying pneumonia even though he has had an unremarkable chest x-ray.  Patient eloped from the emergency  department.   Final Clinical Impression(s) / ED Diagnoses Final diagnoses:  COVID-19  Eloped from emergency department    Rx / DC Orders ED  Discharge Orders     None         Clent Ridges 10/15/22 2138    Linwood Dibbles, MD 10/16/22 [phone]

## 2022-12-06 ENCOUNTER — Other Ambulatory Visit: Payer: Self-pay

## 2022-12-06 ENCOUNTER — Emergency Department (HOSPITAL_COMMUNITY)
Admission: EM | Admit: 2022-12-06 | Discharge: 2022-12-07 | Disposition: A | Discharge status: Home / Self Care | Payer: Medicare HMO | Attending: Emergency Medicine | Admitting: Emergency Medicine

## 2022-12-06 ENCOUNTER — Emergency Department (HOSPITAL_COMMUNITY): Payer: Medicare HMO

## 2022-12-06 ENCOUNTER — Encounter (HOSPITAL_COMMUNITY): Payer: Self-pay

## 2022-12-06 DIAGNOSIS — F0390 Unspecified dementia without behavioral disturbance: Secondary | ICD-10-CM | POA: Insufficient documentation

## 2022-12-06 DIAGNOSIS — R42 Dizziness and giddiness: Secondary | ICD-10-CM | POA: Insufficient documentation

## 2022-12-06 DIAGNOSIS — Z951 Presence of aortocoronary bypass graft: Secondary | ICD-10-CM | POA: Insufficient documentation

## 2022-12-06 DIAGNOSIS — R072 Precordial pain: Secondary | ICD-10-CM

## 2022-12-06 DIAGNOSIS — Z7901 Long term (current) use of anticoagulants: Secondary | ICD-10-CM | POA: Insufficient documentation

## 2022-12-06 DIAGNOSIS — R739 Hyperglycemia, unspecified: Secondary | ICD-10-CM

## 2022-12-06 DIAGNOSIS — R079 Chest pain, unspecified: Secondary | ICD-10-CM | POA: Diagnosis present

## 2022-12-06 DIAGNOSIS — Z8673 Personal history of transient ischemic attack (TIA), and cerebral infarction without residual deficits: Secondary | ICD-10-CM | POA: Diagnosis not present

## 2022-12-06 LAB — COMPREHENSIVE METABOLIC PANEL
ALT: 28 U/L (ref 0–44)
AST: 22 U/L (ref 15–41)
Albumin: 3.6 g/dL (ref 3.5–5.0)
Alkaline Phosphatase: 100 U/L (ref 38–126)
Anion gap: 17 — ABNORMAL HIGH (ref 5–15)
BUN: 31 mg/dL — ABNORMAL HIGH (ref 8–23)
CO2: 19 mmol/L — ABNORMAL LOW (ref 22–32)
Calcium: 9.4 mg/dL (ref 8.9–10.3)
Chloride: 104 mmol/L (ref 98–111)
Creatinine, Ser: 2.68 mg/dL — ABNORMAL HIGH (ref 0.61–1.24)
GFR, Estimated: 24 mL/min — ABNORMAL LOW (ref >60)
Glucose, Bld: 226 mg/dL — ABNORMAL HIGH (ref 70–99)
Potassium: 3.7 mmol/L (ref 3.5–5.1)
Sodium: 140 mmol/L (ref 135–145)
Total Bilirubin: 0.8 mg/dL (ref 0.3–1.2)
Total Protein: 6.1 g/dL — ABNORMAL LOW (ref 6.5–8.1)

## 2022-12-06 LAB — CBC WITH DIFFERENTIAL/PLATELET
Abs Immature Granulocytes: 0.03 10*3/uL (ref 0.00–0.07)
Basophils Absolute: 0.1 10*3/uL (ref 0.0–0.1)
Basophils Relative: 1 %
Eosinophils Absolute: 0.2 10*3/uL (ref 0.0–0.5)
Eosinophils Relative: 4 %
HCT: 44.9 % (ref 39.0–52.0)
Hemoglobin: 15.6 g/dL (ref 13.0–17.0)
Immature Granulocytes: 1 %
Lymphocytes Relative: 25 %
Lymphs Abs: 1.5 10*3/uL (ref 0.7–4.0)
MCH: 29.2 pg (ref 26.0–34.0)
MCHC: 34.7 g/dL (ref 30.0–36.0)
MCV: 83.9 fL (ref 80.0–100.0)
Monocytes Absolute: 0.5 10*3/uL (ref 0.1–1.0)
Monocytes Relative: 8 %
Neutro Abs: 3.8 10*3/uL (ref 1.7–7.7)
Neutrophils Relative %: 61 %
Platelets: 113 10*3/uL — ABNORMAL LOW (ref 150–400)
RBC: 5.35 MIL/uL (ref 4.22–5.81)
RDW: 13.3 % (ref 11.5–15.5)
WBC: 6.1 10*3/uL (ref 4.0–10.5)
nRBC: 0 % (ref 0.0–0.2)

## 2022-12-06 LAB — TROPONIN I (HIGH SENSITIVITY)
Troponin I (High Sensitivity): 20 ng/L — ABNORMAL HIGH (ref <18)
Troponin I (High Sensitivity): 21 ng/L — ABNORMAL HIGH (ref <18)

## 2022-12-06 NOTE — ED Triage Notes (Signed)
Per EMS and pt report, pt has been feeling lightheaded, weak, and dizzy for the past 2 days. Pt is also reporting left chest pain. Rates the pain 3/10 and describes it as squeezing and sometimes sharp.. Has a hx of CABG, stroke, and dementia. Pt has not taken his BP meds or Eliquis in 3 days due to having dental surgery soon. Was given 324mg  ASA enroute.

## 2022-12-06 NOTE — ED Provider Notes (Signed)
[name]   CSN: [id number] Arrival date & time: 12/06/22  1845     History {Add pertinent medical, surgical, social history, OB history to HPI:1} Chief Complaint  Patient presents with   Weakness   Chest Pain   Dizziness   Shortness of Breath    Shane Holloway is a 72 y.o. male.  HPI     Home Medications Prior to Admission medications   Medication Sig Start Date End Date Taking? Authorizing Provider  amiodarone (PACERONE) 200 MG tablet Take 1 tablet (200 mg total) by mouth daily. 03/21/21   Shane Fallen, MD  amiodarone (PACERONE) 200 MG tablet Take 1 tablet by mouth daily. 04/17/21 04/17/22  [provider]  amLODipine (NORVASC) 2.5 MG tablet Take 2.5 mg by mouth daily. Patient not taking: Reported on 02/28/2022    [provider]  apixaban (ELIQUIS) 5 MG TABS tablet Take by mouth. 03/28/21 03/23/22  [provider]  atorvastatin (LIPITOR) 80 MG tablet Take 80 mg by mouth daily. 12/12/20   [provider]  benzonatate (TESSALON PERLES) 100 MG capsule Take 1 capsule (100 mg total) by mouth 3 (three) times daily as needed for cough. Patient not taking: Reported on 02/28/2022 05/14/21   Andrey Campanile, MD  Cholecalciferol 125 MCG (5000 UT) TABS Take 1 tablet by mouth daily.    [provider]  cyclobenzaprine (FLEXERIL) 5 MG tablet Take 5 mg by mouth 2 (two) times daily. Patient not taking: Reported on 02/28/2022 12/10/21   [provider]  ELIQUIS 5 MG TABS tablet Take 5 mg by mouth 2 (two) times daily. 03/09/21   [provider]  empagliflozin (JARDIANCE) 10 MG TABS tablet Take 1 tablet by mouth daily. 08/08/21   [provider]  finasteride (PROSCAR) 5 MG tablet Take 5 mg by mouth daily. Patient not taking: Reported on 02/28/2022    [provider]  furosemide (LASIX) 20 MG tablet Take 1 tablet (20 mg total) by mouth daily. 09/15/11  12/10/21  Elvina Sidle, MD  glipiZIDE (GLUCOTROL XL) 10 MG 24 hr tablet Take 10 mg by mouth daily. 11/14/21   [provider]  glipiZIDE (GLUCOTROL XL) 10 MG 24 hr tablet Take 1 tablet by mouth daily. Patient not taking: Reported on 02/28/2022 04/27/21   [provider]  JARDIANCE 10 MG TABS tablet Take 10 mg by mouth daily. 03/09/21   [provider]  losartan (COZAAR) 100 MG tablet Take 100 mg by mouth daily. Patient not taking: Reported on 02/28/2022 11/29/21   [provider]  losartan (COZAAR) 100 MG tablet Take 1 tablet by mouth daily. 11/29/21 11/29/22  [provider]  metoprolol tartrate (LOPRESSOR) 50 MG tablet Take 1 tablet (50 mg total) by mouth 2 (two) times daily. Patient not taking: Reported on 02/28/2022 03/21/21   Shane Fallen, MD  metoprolol tartrate (LOPRESSOR) 50 MG tablet Take by mouth. 04/17/21 04/17/22  [provider]  nitroGLYCERIN (NITROSTAT) 0.4 MG SL tablet Place under the tongue. 01/23/22   [provider]  Tamsulosin HCl (FLOMAX) 0.4 MG CAPS TAKE ONE CAPSULE BY MOUTH EVERY DAY Patient not taking: Reported on 02/28/2022 12/27/11   Shane Oar, PA      Allergies    Ramipril, Shellfish allergy, and Sulfa antibiotics    Review of Systems   Review of Systems  Physical Exam Updated Vital Signs BP (!) 143/98   Pulse 64   Temp (!) 97.4  F (36.3 C) (Oral)   Resp 16   Ht 1.854 m (6\' 1" )   Wt 104.3 kg   SpO2 99%   BMI 30.34 kg/m  Physical Exam  ED Results / Procedures / Treatments   Labs (all labs ordered are listed, but only abnormal results are displayed) Labs Reviewed  CBC WITH DIFFERENTIAL/PLATELET  COMPREHENSIVE METABOLIC PANEL  TROPONIN I (HIGH SENSITIVITY)    EKG EKG Interpretation Date/Time:  Friday December 06 2022 18:58:16 EDT Ventricular Rate:  64 PR Interval:  255 QRS Duration:  165 QT Interval:  458 QTC Calculation: 473 R Axis:   -21  Text Interpretation: Sinus rhythm  Prolonged PR interval Nonspecific intraventricular conduction delay Probable anterior infarct, age indeterminate Lateral leads are also involved No significant change since last tracing Confirmed by Vanetta Mulders [phone]) on 12/06/2022 7:27:32 PM  Radiology No results found.  Procedures Procedures  {Document cardiac monitor, telemetry assessment procedure when appropriate:1}  Medications Ordered in ED Medications - No data to display  ED Course/ Medical Decision Making/ A&P   {   Click here for ABCD2, HEART and other calculatorsREFRESH Note before signing :1}                          Medical Decision Making Amount and/or Complexity of Data Reviewed Labs: ordered. Radiology: ordered.   ***  {Document critical care time when appropriate:1} {Document review of labs and clinical decision tools ie heart score, Chads2Vasc2 etc:1}  {Document your independent review of radiology images, and any outside records:1} {Document your discussion with family members, caretakers, and with consultants:1} {Document social determinants of health affecting pt's care:1} {Document your decision making why or why not admission, treatments were needed:1} Final Clinical Impression(s) / ED Diagnoses Final diagnoses:  None    Rx / DC Orders ED Discharge Orders     None

## 2022-12-07 NOTE — Discharge Instructions (Signed)
Workup here without any acute findings.  Make appoint follow-up with your doctors as well as your cardiologist.  Do need to have blood sugar rechecked.

## 2023-01-18 ENCOUNTER — Emergency Department (HOSPITAL_COMMUNITY)
Admission: EM | Admit: 2023-01-18 | Discharge: 2023-01-18 | Disposition: A | Discharge status: Home / Self Care | Payer: Medicare HMO | Attending: Emergency Medicine | Admitting: Emergency Medicine

## 2023-01-18 ENCOUNTER — Encounter (HOSPITAL_COMMUNITY): Payer: Self-pay

## 2023-01-18 DIAGNOSIS — Z7901 Long term (current) use of anticoagulants: Secondary | ICD-10-CM | POA: Insufficient documentation

## 2023-01-18 DIAGNOSIS — I2510 Atherosclerotic heart disease of native coronary artery without angina pectoris: Secondary | ICD-10-CM | POA: Insufficient documentation

## 2023-01-18 DIAGNOSIS — E1122 Type 2 diabetes mellitus with diabetic chronic kidney disease: Secondary | ICD-10-CM | POA: Insufficient documentation

## 2023-01-18 DIAGNOSIS — K1379 Other lesions of oral mucosa: Secondary | ICD-10-CM | POA: Diagnosis present

## 2023-01-18 DIAGNOSIS — N183 Chronic kidney disease, stage 3 unspecified: Secondary | ICD-10-CM | POA: Insufficient documentation

## 2023-01-18 LAB — CBC WITH DIFFERENTIAL/PLATELET
Abs Immature Granulocytes: 0.03 10*3/uL (ref 0.00–0.07)
Basophils Absolute: 0.1 10*3/uL (ref 0.0–0.1)
Basophils Relative: 1 %
Eosinophils Absolute: 0.2 10*3/uL (ref 0.0–0.5)
Eosinophils Relative: 3 %
HCT: 42.6 % (ref 39.0–52.0)
Hemoglobin: 14.1 g/dL (ref 13.0–17.0)
Immature Granulocytes: 1 %
Lymphocytes Relative: 15 %
Lymphs Abs: 0.9 10*3/uL (ref 0.7–4.0)
MCH: 28.7 pg (ref 26.0–34.0)
MCHC: 33.1 g/dL (ref 30.0–36.0)
MCV: 86.8 fL (ref 80.0–100.0)
Monocytes Absolute: 0.4 10*3/uL (ref 0.1–1.0)
Monocytes Relative: 6 %
Neutro Abs: 4.7 10*3/uL (ref 1.7–7.7)
Neutrophils Relative %: 74 %
Platelets: 137 10*3/uL — ABNORMAL LOW (ref 150–400)
RBC: 4.91 MIL/uL (ref 4.22–5.81)
RDW: 13.5 % (ref 11.5–15.5)
WBC: 6.2 10*3/uL (ref 4.0–10.5)
nRBC: 0 % (ref 0.0–0.2)

## 2023-01-18 LAB — BASIC METABOLIC PANEL
Anion gap: 15 (ref 5–15)
BUN: 31 mg/dL — ABNORMAL HIGH (ref 8–23)
CO2: 19 mmol/L — ABNORMAL LOW (ref 22–32)
Calcium: 8.9 mg/dL (ref 8.9–10.3)
Chloride: 104 mmol/L (ref 98–111)
Creatinine, Ser: 3.06 mg/dL — ABNORMAL HIGH (ref 0.61–1.24)
GFR, Estimated: 21 mL/min — ABNORMAL LOW (ref >60)
Glucose, Bld: 219 mg/dL — ABNORMAL HIGH (ref 70–99)
Potassium: 4.1 mmol/L (ref 3.5–5.1)
Sodium: 138 mmol/L (ref 135–145)

## 2023-01-18 LAB — PROTIME-INR
INR: 1.2 (ref 0.8–1.2)
Prothrombin Time: 15 seconds (ref 11.4–15.2)

## 2023-01-18 NOTE — ED Notes (Signed)
Pt removed gauze from mouth/ pt using yaunker to remove blood from mouth

## 2023-01-18 NOTE — ED Triage Notes (Signed)
BIB EMS/ pt had oral surgery x1 week ago to remove 8 teeth/ 1 stitch fell out at 0100 this am/ pt bleeding heavily/ on eliquis

## 2023-01-18 NOTE — Discharge Instructions (Addendum)
You were seen in the ER today for your bleeding from  your tooth extraction site. This was resolved by the time you were seen in the ER. Your blood work was reassuring. Follow up with your oral surgeon and return to the ER with any new severe symptoms.

## 2023-01-18 NOTE — ED Provider Notes (Signed)
[place] EMERGENCY DEPARTMENT AT [facility] Provider Note   CSN: [id number] Arrival date & time: 01/18/23  0410     History  Chief Complaint  Patient presents with   gum bleeding     Shane Holloway is a 73 y.o. male with history of aortic valve replacement anticoagulated on Eliquis who presents with concern for oral bleeding.  Patient had 8 teeth removed approximately 1 week ago.  States that around 1 AM one of the sutures came open in his mouth and he began to bleed heavily.  Per EMS significant blood loss on scene.  No palpitations shortness of breath or syncope.  Patient presents to the emergency department with mouth packed with a gauze, hemostatic at this time.  In addition of the above listed history is history of type 2 diabetes, CAD, CKD stage III, NSTEMI, hyperlipidemia.  HPI     Home Medications Prior to Admission medications   Medication Sig Start Date End Date Taking? Authorizing Provider  amiodarone (PACERONE) 200 MG tablet Take 1 tablet (200 mg total) by mouth daily. 03/21/21   Azucena Fallen, MD  amiodarone (PACERONE) 200 MG tablet Take 1 tablet by mouth daily. 04/17/21 04/17/22  [provider]  amLODipine (NORVASC) 2.5 MG tablet Take 2.5 mg by mouth daily. Patient not taking: Reported on 02/28/2022    [provider]  apixaban (ELIQUIS) 5 MG TABS tablet Take by mouth. 03/28/21 03/23/22  [provider]  atorvastatin (LIPITOR) 80 MG tablet Take 80 mg by mouth daily. 12/12/20   [provider]  benzonatate (TESSALON PERLES) 100 MG capsule Take 1 capsule (100 mg total) by mouth 3 (three) times daily as needed for cough. Patient not taking: Reported on 02/28/2022 05/14/21   Andrey Campanile, MD  Cholecalciferol 125 MCG (5000 UT) TABS Take 1 tablet by mouth daily.    [provider]  cyclobenzaprine (FLEXERIL) 5 MG tablet Take 5 mg by mouth 2 (two) times daily. Patient not taking: Reported on 02/28/2022  12/10/21   [provider]  ELIQUIS 5 MG TABS tablet Take 5 mg by mouth 2 (two) times daily. 03/09/21   [provider]  empagliflozin (JARDIANCE) 10 MG TABS tablet Take 1 tablet by mouth daily. 08/08/21   [provider]  finasteride (PROSCAR) 5 MG tablet Take 5 mg by mouth daily. Patient not taking: Reported on 02/28/2022    [provider]  furosemide (LASIX) 20 MG tablet Take 1 tablet (20 mg total) by mouth daily. 09/15/11 12/10/21  Elvina Sidle, MD  glipiZIDE (GLUCOTROL XL) 10 MG 24 hr tablet Take 10 mg by mouth daily. 11/14/21   [provider]  glipiZIDE (GLUCOTROL XL) 10 MG 24 hr tablet Take 1 tablet by mouth daily. Patient not taking: Reported on 02/28/2022 04/27/21   [provider]  JARDIANCE 10 MG TABS tablet Take 10 mg by mouth daily. 03/09/21   [provider]  losartan (COZAAR) 100 MG tablet Take 100 mg by mouth daily. Patient not taking: Reported on 02/28/2022 11/29/21   [provider]  losartan (COZAAR) 100 MG tablet Take 1 tablet by mouth daily. 11/29/21 11/29/22  [provider]  metoprolol tartrate (LOPRESSOR) 50 MG tablet Take 1 tablet (50 mg total) by mouth 2 (two) times daily. Patient not taking: Reported on 02/28/2022 03/21/21   Azucena Fallen, MD  metoprolol tartrate (LOPRESSOR) 50 MG tablet Take by mouth. 04/17/21 04/17/22  [provider]  nitroGLYCERIN (NITROSTAT) 0.4 MG SL  tablet Place under the tongue. 01/23/22   [provider]  Tamsulosin HCl (FLOMAX) 0.4 MG CAPS TAKE ONE CAPSULE BY MOUTH EVERY DAY Patient not taking: Reported on 02/28/2022 12/27/11   Porfirio Oar, PA      Allergies    Ramipril, Shellfish allergy, and Sulfa antibiotics    Review of Systems   Review of Systems  HENT:          Bleeding from oral surgical site.  Respiratory: Negative.    Cardiovascular: Negative.   Gastrointestinal: Negative.   Genitourinary: Negative.     Physical Exam Updated  Vital Signs BP 126/77 (BP Location: Right Arm)   Pulse 67   Temp 97.9 F (36.6 C) (Oral)   Resp 17   Wt 105.2 kg   SpO2 97%   BMI 30.61 kg/m  Physical Exam Vitals and nursing note reviewed.  Constitutional:      Appearance: He is not ill-appearing or toxic-appearing.  HENT:     Head: Normocephalic and atraumatic.     Mouth/Throat:     Mouth: Mucous membranes are moist.     Pharynx: Oropharynx is clear. Uvula midline.     Tonsils: No tonsillar exudate.   Eyes:     General: No scleral icterus.       Right eye: No discharge.        Left eye: No discharge.     Conjunctiva/sclera: Conjunctivae normal.  Cardiovascular:     Heart sounds: Normal heart sounds. No murmur heard. Pulmonary:     Effort: Pulmonary effort is normal.  Skin:    General: Skin is warm and dry.  Neurological:     General: No focal deficit present.     Mental Status: He is alert.  Psychiatric:        Mood and Affect: Mood normal.     ED Results / Procedures / Treatments   Labs (all labs ordered are listed, but only abnormal results are displayed) Labs Reviewed  CBC WITH DIFFERENTIAL/PLATELET - Abnormal; Notable for the following components:      Result Value   Platelets 137 (*)    All other components within normal limits  BASIC METABOLIC PANEL - Abnormal; Notable for the following components:   CO2 19 (*)    Glucose, Bld 219 (*)    BUN 31 (*)    Creatinine, Ser 3.06 (*)    GFR, Estimated 21 (*)    All other components within normal limits  PROTIME-INR    EKG None  Radiology No results found.  Procedures Procedures    Medications Ordered in ED Medications - No data to display  ED Course/ Medical Decision Making/ A&P                                 Medical Decision Making 72 year old male with anticoagulation who presents with oral bleeding from surgical site of tooth extraction.  Vital signs are unremarkable but oral exam as above with surgical incision which is closed and  hemostatic at this time.  Patient tolerating p.o.  Amount and/or Complexity of Data Reviewed Labs: ordered.    Details: CBC with thrombocytopenia of 137 near patient's baseline of 122.  BMP with creatinine of 3 near patient's baseline of 2.8.  INR normal at 1.2.   Patient observed in the ER for 3 hours without rebleed of his oral wound.  Recommend follow-up with his dentist in the outpatient setting.  Clinical concern for emergent underlying etiology that would warrant further ED workup or inpatient management is exceedingly low. No evidence of severe acute blood loss anemia.   Wester  voiced understanding of her medical evaluation and treatment plan. Each of their questions answered to their expressed satisfaction.  Return precautions were given.  Patient is well-appearing, stable, and was discharged in good condition.  This chart was dictated using voice recognition software, Dragon. Despite the best efforts of this provider to proofread and correct errors, errors may still occur which can change documentation meaning.       Final Clinical Impression(s) / ED Diagnoses Final diagnoses:  Oral hemorrhage    Rx / DC Orders ED Discharge Orders     None         Sherrilee Gilles 01/18/23 0701    Zadie Rhine, MD 01/19/23 [phone]

## 2023-01-19 ENCOUNTER — Encounter (HOSPITAL_COMMUNITY): Payer: Self-pay | Admitting: Emergency Medicine

## 2023-01-19 ENCOUNTER — Emergency Department (HOSPITAL_COMMUNITY)
Admission: EM | Admit: 2023-01-19 | Discharge: 2023-01-19 | Disposition: A | Discharge status: Home / Self Care | Payer: Medicare HMO | Attending: Emergency Medicine | Admitting: Emergency Medicine

## 2023-01-19 ENCOUNTER — Other Ambulatory Visit: Payer: Self-pay

## 2023-01-19 DIAGNOSIS — Z7901 Long term (current) use of anticoagulants: Secondary | ICD-10-CM | POA: Diagnosis not present

## 2023-01-19 DIAGNOSIS — Z79899 Other long term (current) drug therapy: Secondary | ICD-10-CM | POA: Diagnosis not present

## 2023-01-19 DIAGNOSIS — K0889 Other specified disorders of teeth and supporting structures: Secondary | ICD-10-CM | POA: Insufficient documentation

## 2023-01-19 DIAGNOSIS — N189 Chronic kidney disease, unspecified: Secondary | ICD-10-CM | POA: Diagnosis not present

## 2023-01-19 DIAGNOSIS — R58 Hemorrhage, not elsewhere classified: Secondary | ICD-10-CM

## 2023-01-19 LAB — CBC WITH DIFFERENTIAL/PLATELET
Abs Immature Granulocytes: 0.05 10*3/uL (ref 0.00–0.07)
Basophils Absolute: 0.1 10*3/uL (ref 0.0–0.1)
Basophils Relative: 1 %
Eosinophils Absolute: 0.1 10*3/uL (ref 0.0–0.5)
Eosinophils Relative: 1 %
HCT: 39.3 % (ref 39.0–52.0)
Hemoglobin: 12.7 g/dL — ABNORMAL LOW (ref 13.0–17.0)
Immature Granulocytes: 1 %
Lymphocytes Relative: 17 %
Lymphs Abs: 1.2 10*3/uL (ref 0.7–4.0)
MCH: 28.3 pg (ref 26.0–34.0)
MCHC: 32.3 g/dL (ref 30.0–36.0)
MCV: 87.7 fL (ref 80.0–100.0)
Monocytes Absolute: 0.4 10*3/uL (ref 0.1–1.0)
Monocytes Relative: 6 %
Neutro Abs: 5.6 10*3/uL (ref 1.7–7.7)
Neutrophils Relative %: 74 %
Platelets: 141 10*3/uL — ABNORMAL LOW (ref 150–400)
RBC: 4.48 MIL/uL (ref 4.22–5.81)
RDW: 13.6 % (ref 11.5–15.5)
WBC: 7.4 10*3/uL (ref 4.0–10.5)
nRBC: 0 % (ref 0.0–0.2)

## 2023-01-19 LAB — PROTIME-INR
INR: 1.3 — ABNORMAL HIGH (ref 0.8–1.2)
Prothrombin Time: 16.1 seconds — ABNORMAL HIGH (ref 11.4–15.2)

## 2023-01-19 LAB — COMPREHENSIVE METABOLIC PANEL
ALT: 19 U/L (ref 0–44)
AST: 15 U/L (ref 15–41)
Albumin: 3.4 g/dL — ABNORMAL LOW (ref 3.5–5.0)
Alkaline Phosphatase: 81 U/L (ref 38–126)
Anion gap: 12 (ref 5–15)
BUN: 50 mg/dL — ABNORMAL HIGH (ref 8–23)
CO2: 22 mmol/L (ref 22–32)
Calcium: 9.1 mg/dL (ref 8.9–10.3)
Chloride: 104 mmol/L (ref 98–111)
Creatinine, Ser: 3.23 mg/dL — ABNORMAL HIGH (ref 0.61–1.24)
GFR, Estimated: 19 mL/min — ABNORMAL LOW (ref >60)
Glucose, Bld: 242 mg/dL — ABNORMAL HIGH (ref 70–99)
Potassium: 4.2 mmol/L (ref 3.5–5.1)
Sodium: 138 mmol/L (ref 135–145)
Total Bilirubin: 0.6 mg/dL (ref 0.3–1.2)
Total Protein: 6.1 g/dL — ABNORMAL LOW (ref 6.5–8.1)

## 2023-01-19 LAB — ETHANOL: Alcohol, Ethyl (B): <10 mg/dL (ref <10)

## 2023-01-19 MED ORDER — TRANEXAMIC ACID FOR EPISTAXIS
500.0000 mg | Freq: Once | TOPICAL | Status: AC
  Administered 2023-01-19: 500 mg via TOPICAL
  Filled 2023-01-19 (×2): qty 10

## 2023-01-19 NOTE — ED Provider Notes (Signed)
[place] EMERGENCY DEPARTMENT AT [facility] Provider Note   CSN: [id number] Arrival date & time: 01/19/23  6433     History  Chief Complaint  Patient presents with   Dental Bleeding    Shane Holloway is a 72 y.o. male.  The history is provided by the patient.  Shane Holloway is a 72 y.o. male who presents to the Emergency Department complaining of bleeding gums.  He presents to the ED by EMS for evaluation of bleeding from his gums.  EMS reports EtOH on board.  Patient states that he had a dental extraction 3 weeks ago.  He is unsure if he is on a blood thinner.  He denies any alcohol use.  He does state that he lives at home by himself.  He states that his neighbor helps him with his medications.  He states that the bleeding restarted last night.      Home Medications Prior to Admission medications   Medication Sig Start Date End Date Taking? Authorizing Provider  amiodarone (PACERONE) 200 MG tablet Take 1 tablet (200 mg total) by mouth daily. 03/21/21   Azucena Fallen, MD  amiodarone (PACERONE) 200 MG tablet Take 1 tablet by mouth daily. 04/17/21 04/17/22  [provider]  amLODipine (NORVASC) 2.5 MG tablet Take 2.5 mg by mouth daily. Patient not taking: Reported on 02/28/2022    [provider]  apixaban (ELIQUIS) 5 MG TABS tablet Take by mouth. 03/28/21 03/23/22  [provider]  atorvastatin (LIPITOR) 80 MG tablet Take 80 mg by mouth daily. 12/12/20   [provider]  benzonatate (TESSALON PERLES) 100 MG capsule Take 1 capsule (100 mg total) by mouth 3 (three) times daily as needed for cough. Patient not taking: Reported on 02/28/2022 05/14/21   Andrey Campanile, MD  Cholecalciferol 125 MCG (5000 UT) TABS Take 1 tablet by mouth daily.    [provider]  cyclobenzaprine (FLEXERIL) 5 MG tablet Take 5 mg by mouth 2 (two) times daily. Patient not taking: Reported on 02/28/2022 12/10/21   [provider]   ELIQUIS 5 MG TABS tablet Take 5 mg by mouth 2 (two) times daily. 03/09/21   [provider]  empagliflozin (JARDIANCE) 10 MG TABS tablet Take 1 tablet by mouth daily. 08/08/21   [provider]  finasteride (PROSCAR) 5 MG tablet Take 5 mg by mouth daily. Patient not taking: Reported on 02/28/2022    [provider]  furosemide (LASIX) 20 MG tablet Take 1 tablet (20 mg total) by mouth daily. 09/15/11 12/10/21  Elvina Sidle, MD  glipiZIDE (GLUCOTROL XL) 10 MG 24 hr tablet Take 10 mg by mouth daily. 11/14/21   [provider]  glipiZIDE (GLUCOTROL XL) 10 MG 24 hr tablet Take 1 tablet by mouth daily. Patient not taking: Reported on 02/28/2022 04/27/21   [provider]  JARDIANCE 10 MG TABS tablet Take 10 mg by mouth daily. 03/09/21   [provider]  losartan (COZAAR) 100 MG tablet Take 100 mg by mouth daily. Patient not taking: Reported on 02/28/2022 11/29/21   [provider]  losartan (COZAAR) 100 MG tablet Take 1 tablet by mouth daily. 11/29/21 11/29/22  [provider]  metoprolol tartrate (LOPRESSOR) 50 MG tablet Take 1 tablet (50 mg total) by mouth 2 (two) times daily. Patient not taking: Reported on 02/28/2022 03/21/21   Azucena Fallen, MD  metoprolol tartrate (LOPRESSOR) 50 MG tablet Take by mouth. 04/17/21 04/17/22  [provider]  nitroGLYCERIN (NITROSTAT) 0.4 MG SL tablet Place under the tongue. 01/23/22   [provider]  Tamsulosin HCl (FLOMAX) 0.4 MG CAPS TAKE ONE CAPSULE BY MOUTH EVERY DAY Patient not taking: Reported on 02/28/2022 12/27/11   Porfirio Oar, PA      Allergies    Ramipril, Shellfish allergy, and Sulfa antibiotics    Review of Systems   Review of Systems  All other systems reviewed and are negative.   Physical Exam Updated Vital Signs BP 114/81 (BP Location: Right Arm)   Pulse 72   Temp 97.7 F (36.5 C) (Oral)   Resp 18   SpO2 97%  Physical Exam Vitals and nursing note  reviewed.  Constitutional:      Appearance: He is well-developed.  HENT:     Head: Normocephalic and atraumatic.     Comments: There is a granuloma on the lower anterior gingiva with small amount of bleeding.  Cardiovascular:     Rate and Rhythm: Normal rate and regular rhythm.     Heart sounds: No murmur heard. Pulmonary:     Effort: Pulmonary effort is normal. No respiratory distress.  Abdominal:     Palpations: Abdomen is soft.     Tenderness: There is no abdominal tenderness. There is no guarding or rebound.  Musculoskeletal:        General: No tenderness.  Skin:    General: Skin is warm and dry.  Neurological:     Mental Status: He is alert.     Comments: Oriented to person, place.  Disoriented to time and recent events.   Psychiatric:        Behavior: Behavior normal.     ED Results / Procedures / Treatments   Labs (all labs ordered are listed, but only abnormal results are displayed) Labs Reviewed - No data to display  EKG None  Radiology No results found.  Procedures Procedures    Medications Ordered in ED Medications - No data to display  ED Course/ Medical Decision Making/ A&P                                 Medical Decision Making Amount and/or Complexity of Data Reviewed Labs: ordered.   Patient here for evaluation of bleeding from his gums.  There is a very small amount of oozing from the lower anterior gum.  Will place TXA pledget.  Patient does seem somewhat confused.  Attempted twice to reach family-no answer.  He does deny any alcohol use.  He is edentulous, which makes him slightly dysarthric.  On record review he does have a history of dementia.  Plan to obtain labs given his confusion and unable to reach additional family for further information.  Patient care transferred pending labs and reevaluation.        Final Clinical Impression(s) / ED Diagnoses Final diagnoses:  None    Rx / DC Orders ED Discharge Orders     None          Tilden Fossa, MD 01/19/23 0725

## 2023-01-19 NOTE — ED Provider Notes (Signed)
Patient presented to the ED for evaluation of bleeding gums.  Patient recently had a dental extraction.  Outpatient records suggest the patient does take Eliquis.  Patient does have primary care follow-up.  Records indicate he is followed at [facility] internal medicine.  He was just seen on August 9 2 days ago.  No signs of active bleeding in the ED.  Labs are reassuring.  No significant anemia.  He does have elevated creatinine but this appears similar to previous values.  Patient states he does have dementia.  He is not supposed to drive.  There is a neighbor that helps care for him.  Will call neighbor to arrange discharge and safe transport   Linwood Dibbles, MD 01/19/23 [phone]

## 2023-01-19 NOTE — ED Notes (Signed)
Provided pt warm blanket 

## 2023-01-19 NOTE — Discharge Instructions (Addendum)
Follow-up with your dentist as we discussed to recheck your dental extraction.  You can bite on teabags intermittently to help apply pressure to the area of dental extraction.  Follow-up with your primary care doctor to recheck on your kidney test.  Return to the ED for worsening symptoms

## 2023-01-19 NOTE — ED Triage Notes (Signed)
Pt BIB EMS from home with c/o dental bleeding after having all of his teeth removed since last week. Pt still taking his blood thinner. Etoh on board.

## 2023-01-24 ENCOUNTER — Observation Stay (HOSPITAL_COMMUNITY): Payer: Medicare HMO

## 2023-01-24 ENCOUNTER — Other Ambulatory Visit: Payer: Self-pay

## 2023-01-24 ENCOUNTER — Inpatient Hospital Stay (HOSPITAL_COMMUNITY)
Admission: EM | Admit: 2023-01-24 | Discharge: 2023-01-27 | DRG: 065 | Disposition: A | Discharge status: Skilled Nursing Facility | Payer: Medicare HMO | Attending: Internal Medicine | Admitting: Internal Medicine

## 2023-01-24 ENCOUNTER — Other Ambulatory Visit (HOSPITAL_COMMUNITY): Payer: Medicare HMO

## 2023-01-24 ENCOUNTER — Emergency Department (HOSPITAL_COMMUNITY): Payer: Medicare HMO

## 2023-01-24 ENCOUNTER — Encounter (HOSPITAL_COMMUNITY): Payer: Self-pay

## 2023-01-24 DIAGNOSIS — Z953 Presence of xenogenic heart valve: Secondary | ICD-10-CM

## 2023-01-24 DIAGNOSIS — Z8249 Family history of ischemic heart disease and other diseases of the circulatory system: Secondary | ICD-10-CM

## 2023-01-24 DIAGNOSIS — Z882 Allergy status to sulfonamides status: Secondary | ICD-10-CM

## 2023-01-24 DIAGNOSIS — Z79899 Other long term (current) drug therapy: Secondary | ICD-10-CM

## 2023-01-24 DIAGNOSIS — I63522 Cerebral infarction due to unspecified occlusion or stenosis of left anterior cerebral artery: Secondary | ICD-10-CM | POA: Diagnosis not present

## 2023-01-24 DIAGNOSIS — I5042 Chronic combined systolic (congestive) and diastolic (congestive) heart failure: Secondary | ICD-10-CM | POA: Diagnosis present

## 2023-01-24 DIAGNOSIS — I5022 Chronic systolic (congestive) heart failure: Secondary | ICD-10-CM | POA: Diagnosis present

## 2023-01-24 DIAGNOSIS — Z888 Allergy status to other drugs, medicaments and biological substances status: Secondary | ICD-10-CM

## 2023-01-24 DIAGNOSIS — E1165 Type 2 diabetes mellitus with hyperglycemia: Secondary | ICD-10-CM | POA: Diagnosis present

## 2023-01-24 DIAGNOSIS — E876 Hypokalemia: Secondary | ICD-10-CM | POA: Diagnosis present

## 2023-01-24 DIAGNOSIS — I252 Old myocardial infarction: Secondary | ICD-10-CM

## 2023-01-24 DIAGNOSIS — Z7984 Long term (current) use of oral hypoglycemic drugs: Secondary | ICD-10-CM

## 2023-01-24 DIAGNOSIS — I2510 Atherosclerotic heart disease of native coronary artery without angina pectoris: Secondary | ICD-10-CM | POA: Diagnosis present

## 2023-01-24 DIAGNOSIS — N1832 Chronic kidney disease, stage 3b: Secondary | ICD-10-CM | POA: Diagnosis not present

## 2023-01-24 DIAGNOSIS — I5020 Unspecified systolic (congestive) heart failure: Secondary | ICD-10-CM

## 2023-01-24 DIAGNOSIS — F0150 Vascular dementia without behavioral disturbance: Secondary | ICD-10-CM | POA: Diagnosis present

## 2023-01-24 DIAGNOSIS — R29704 NIHSS score 4: Secondary | ICD-10-CM | POA: Diagnosis present

## 2023-01-24 DIAGNOSIS — Z6830 Body mass index (BMI) 30.0-30.9, adult: Secondary | ICD-10-CM

## 2023-01-24 DIAGNOSIS — N400 Enlarged prostate without lower urinary tract symptoms: Secondary | ICD-10-CM | POA: Diagnosis present

## 2023-01-24 DIAGNOSIS — Z951 Presence of aortocoronary bypass graft: Secondary | ICD-10-CM

## 2023-01-24 DIAGNOSIS — I25708 Atherosclerosis of coronary artery bypass graft(s), unspecified, with other forms of angina pectoris: Secondary | ICD-10-CM

## 2023-01-24 DIAGNOSIS — Z7901 Long term (current) use of anticoagulants: Secondary | ICD-10-CM

## 2023-01-24 DIAGNOSIS — I130 Hypertensive heart and chronic kidney disease with heart failure and stage 1 through stage 4 chronic kidney disease, or unspecified chronic kidney disease: Secondary | ICD-10-CM | POA: Diagnosis present

## 2023-01-24 DIAGNOSIS — Z7982 Long term (current) use of aspirin: Secondary | ICD-10-CM

## 2023-01-24 DIAGNOSIS — E785 Hyperlipidemia, unspecified: Secondary | ICD-10-CM | POA: Diagnosis present

## 2023-01-24 DIAGNOSIS — I82C12 Acute embolism and thrombosis of left internal jugular vein: Secondary | ICD-10-CM | POA: Diagnosis present

## 2023-01-24 DIAGNOSIS — I25810 Atherosclerosis of coronary artery bypass graft(s) without angina pectoris: Secondary | ICD-10-CM | POA: Diagnosis present

## 2023-01-24 DIAGNOSIS — Z86718 Personal history of other venous thrombosis and embolism: Secondary | ICD-10-CM

## 2023-01-24 DIAGNOSIS — I10 Essential (primary) hypertension: Secondary | ICD-10-CM | POA: Diagnosis present

## 2023-01-24 DIAGNOSIS — Z87891 Personal history of nicotine dependence: Secondary | ICD-10-CM

## 2023-01-24 DIAGNOSIS — Z8616 Personal history of COVID-19: Secondary | ICD-10-CM

## 2023-01-24 DIAGNOSIS — N184 Chronic kidney disease, stage 4 (severe): Secondary | ICD-10-CM | POA: Diagnosis present

## 2023-01-24 DIAGNOSIS — I63532 Cerebral infarction due to unspecified occlusion or stenosis of left posterior cerebral artery: Secondary | ICD-10-CM | POA: Diagnosis present

## 2023-01-24 DIAGNOSIS — Z8673 Personal history of transient ischemic attack (TIA), and cerebral infarction without residual deficits: Secondary | ICD-10-CM

## 2023-01-24 DIAGNOSIS — I639 Cerebral infarction, unspecified: Secondary | ICD-10-CM | POA: Diagnosis not present

## 2023-01-24 DIAGNOSIS — Z91013 Allergy to seafood: Secondary | ICD-10-CM

## 2023-01-24 DIAGNOSIS — F01A0 Vascular dementia, mild, without behavioral disturbance, psychotic disturbance, mood disturbance, and anxiety: Secondary | ICD-10-CM | POA: Diagnosis present

## 2023-01-24 DIAGNOSIS — E669 Obesity, unspecified: Secondary | ICD-10-CM | POA: Diagnosis present

## 2023-01-24 DIAGNOSIS — E1122 Type 2 diabetes mellitus with diabetic chronic kidney disease: Secondary | ICD-10-CM | POA: Diagnosis present

## 2023-01-24 DIAGNOSIS — I350 Nonrheumatic aortic (valve) stenosis: Secondary | ICD-10-CM | POA: Diagnosis present

## 2023-01-24 LAB — URINALYSIS, ROUTINE W REFLEX MICROSCOPIC
Bilirubin Urine: NEGATIVE
Glucose, UA: >=500 mg/dL — AB
Hgb urine dipstick: NEGATIVE
Ketones, ur: NEGATIVE mg/dL
Nitrite: NEGATIVE
Protein, ur: 100 mg/dL — AB
Specific Gravity, Urine: 1.012 (ref 1.005–1.030)
WBC, UA: >50 WBC/hpf (ref 0–5)
pH: 6 (ref 5.0–8.0)

## 2023-01-24 LAB — CBC
HCT: 36.2 % — ABNORMAL LOW (ref 39.0–52.0)
Hemoglobin: 12.2 g/dL — ABNORMAL LOW (ref 13.0–17.0)
MCH: 30.2 pg (ref 26.0–34.0)
MCHC: 33.7 g/dL (ref 30.0–36.0)
MCV: 89.6 fL (ref 80.0–100.0)
Platelets: 121 10*3/uL — ABNORMAL LOW (ref 150–400)
RBC: 4.04 MIL/uL — ABNORMAL LOW (ref 4.22–5.81)
RDW: 13.8 % (ref 11.5–15.5)
WBC: 5.4 10*3/uL (ref 4.0–10.5)
nRBC: 0 % (ref 0.0–0.2)

## 2023-01-24 LAB — COMPREHENSIVE METABOLIC PANEL
ALT: 28 U/L (ref 0–44)
AST: 20 U/L (ref 15–41)
Albumin: 3.4 g/dL — ABNORMAL LOW (ref 3.5–5.0)
Alkaline Phosphatase: 83 U/L (ref 38–126)
Anion gap: 14 (ref 5–15)
BUN: 33 mg/dL — ABNORMAL HIGH (ref 8–23)
CO2: 23 mmol/L (ref 22–32)
Calcium: 9.2 mg/dL (ref 8.9–10.3)
Chloride: 107 mmol/L (ref 98–111)
Creatinine, Ser: 3.25 mg/dL — ABNORMAL HIGH (ref 0.61–1.24)
GFR, Estimated: 19 mL/min — ABNORMAL LOW (ref >60)
Glucose, Bld: 191 mg/dL — ABNORMAL HIGH (ref 70–99)
Potassium: 4.3 mmol/L (ref 3.5–5.1)
Sodium: 144 mmol/L (ref 135–145)
Total Bilirubin: 0.6 mg/dL (ref 0.3–1.2)
Total Protein: 5.7 g/dL — ABNORMAL LOW (ref 6.5–8.1)

## 2023-01-24 LAB — RAPID URINE DRUG SCREEN, HOSP PERFORMED
Amphetamines: NOT DETECTED
Barbiturates: NOT DETECTED
Benzodiazepines: NOT DETECTED
Cocaine: NOT DETECTED
Opiates: NOT DETECTED
Tetrahydrocannabinol: NOT DETECTED

## 2023-01-24 LAB — I-STAT CHEM 8, ED
BUN: 31 mg/dL — ABNORMAL HIGH (ref 8–23)
Calcium, Ion: 1.25 mmol/L (ref 1.15–1.40)
Chloride: 108 mmol/L (ref 98–111)
Creatinine, Ser: 3.4 mg/dL — ABNORMAL HIGH (ref 0.61–1.24)
Glucose, Bld: 180 mg/dL — ABNORMAL HIGH (ref 70–99)
HCT: 34 % — ABNORMAL LOW (ref 39.0–52.0)
Hemoglobin: 11.6 g/dL — ABNORMAL LOW (ref 13.0–17.0)
Potassium: 4.2 mmol/L (ref 3.5–5.1)
Sodium: 143 mmol/L (ref 135–145)
TCO2: 23 mmol/L (ref 22–32)

## 2023-01-24 LAB — DIFFERENTIAL
Abs Immature Granulocytes: 0.04 10*3/uL (ref 0.00–0.07)
Basophils Absolute: 0.1 10*3/uL (ref 0.0–0.1)
Basophils Relative: 1 %
Eosinophils Absolute: 0.2 10*3/uL (ref 0.0–0.5)
Eosinophils Relative: 3 %
Immature Granulocytes: 1 %
Lymphocytes Relative: 19 %
Lymphs Abs: 1.1 10*3/uL (ref 0.7–4.0)
Monocytes Absolute: 0.5 10*3/uL (ref 0.1–1.0)
Monocytes Relative: 9 %
Neutro Abs: 3.6 10*3/uL (ref 1.7–7.7)
Neutrophils Relative %: 67 %

## 2023-01-24 LAB — ETHANOL: Alcohol, Ethyl (B): <10 mg/dL (ref <10)

## 2023-01-24 LAB — GLUCOSE, CAPILLARY: Glucose-Capillary: 215 mg/dL — ABNORMAL HIGH (ref 70–99)

## 2023-01-24 LAB — APTT: aPTT: 28 seconds (ref 24–36)

## 2023-01-24 LAB — PROTIME-INR
INR: 1 (ref 0.8–1.2)
Prothrombin Time: 13.8 seconds (ref 11.4–15.2)

## 2023-01-24 MED ORDER — SENNOSIDES-DOCUSATE SODIUM 8.6-50 MG PO TABS: 1.0000 | ORAL_TABLET | Freq: Every evening | ORAL | Status: DC | PRN

## 2023-01-24 MED ORDER — STROKE: EARLY STAGES OF RECOVERY BOOK
Freq: Once | Status: AC
  Filled 2023-01-24: qty 1

## 2023-01-24 MED ORDER — SODIUM CHLORIDE 0.9 % IV SOLN: INTRAVENOUS | Status: DC

## 2023-01-24 MED ORDER — ACETAMINOPHEN 160 MG/5ML PO SOLN: 650.0000 mg | ORAL | Status: DC | PRN

## 2023-01-24 MED ORDER — METOPROLOL SUCCINATE ER 50 MG PO TB24
50.0000 mg | ORAL_TABLET | Freq: Every day | ORAL | Status: DC
  Administered 2023-01-25 – 2023-01-27 (×3): 50 mg via ORAL
  Filled 2023-01-24 (×3): qty 1

## 2023-01-24 MED ORDER — AMIODARONE HCL 200 MG PO TABS
200.0000 mg | ORAL_TABLET | Freq: Every day | ORAL | Status: DC
  Administered 2023-01-25 – 2023-01-27 (×3): 200 mg via ORAL
  Filled 2023-01-24 (×3): qty 1

## 2023-01-24 MED ORDER — INSULIN DETEMIR 100 UNIT/ML ~~LOC~~ SOLN
10.0000 [IU] | Freq: Every day | SUBCUTANEOUS | Status: DC
  Administered 2023-01-24 – 2023-01-25 (×2): 10 [IU] via SUBCUTANEOUS
  Filled 2023-01-24 (×3): qty 0.1

## 2023-01-24 MED ORDER — EZETIMIBE 10 MG PO TABS
10.0000 mg | ORAL_TABLET | Freq: Every day | ORAL | Status: DC
  Administered 2023-01-25 – 2023-01-27 (×3): 10 mg via ORAL
  Filled 2023-01-24 (×3): qty 1

## 2023-01-24 MED ORDER — INSULIN ASPART 100 UNIT/ML IJ SOLN
0.0000 [IU] | Freq: Three times a day (TID) | INTRAMUSCULAR | Status: DC
  Administered 2023-01-25 – 2023-01-27 (×6): 1 [IU] via SUBCUTANEOUS

## 2023-01-24 MED ORDER — ACETAMINOPHEN 325 MG PO TABS
650.0000 mg | ORAL_TABLET | ORAL | Status: DC | PRN
  Administered 2023-01-25: 650 mg via ORAL
  Filled 2023-01-24 (×2): qty 2

## 2023-01-24 MED ORDER — APIXABAN 5 MG PO TABS
5.0000 mg | ORAL_TABLET | Freq: Two times a day (BID) | ORAL | Status: DC
  Administered 2023-01-24 – 2023-01-25 (×2): 5 mg via ORAL
  Filled 2023-01-24 (×2): qty 1

## 2023-01-24 MED ORDER — ASPIRIN 81 MG PO CHEW: 81.0000 mg | CHEWABLE_TABLET | Freq: Every day | ORAL | Status: DC

## 2023-01-24 MED ORDER — ACETAMINOPHEN 650 MG RE SUPP: 650.0000 mg | RECTAL | Status: DC | PRN

## 2023-01-24 MED ORDER — FINASTERIDE 5 MG PO TABS
5.0000 mg | ORAL_TABLET | Freq: Every day | ORAL | Status: DC
  Administered 2023-01-25 – 2023-01-27 (×3): 5 mg via ORAL
  Filled 2023-01-24 (×3): qty 1

## 2023-01-24 NOTE — ED Provider Notes (Signed)
[place] EMERGENCY DEPARTMENT AT [facility] Provider Note   CSN: [id number] Arrival date & time: 01/24/23  1026     History  Chief Complaint  Patient presents with   Extremity Weakness    Shane Holloway is a 72 y.o. male with PMH as listed below who presents with right lower extremity weakness and numbness.  He states that he woke up this morning at 9:30 AM with weakness that to his right foot and right lower extremity.  States that the sensation is altered as well.  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  He does not know if he has any residual weakness from those prior strokes.  But this right lower extremity is abnormal.  Last known normal was 8:30 PM last night when he went to sleep.  He normally takes Eliquis however he had it held recently in order to have teeth pulled.  He denies any chest pain abdominal pain, facial droop, slurred speech.  He did fall a week ago and did hit his head and was not evaluated for that.  He denies any neck pain.  Denies any pain in the right lower extremity or elsewhere.  Did not fall today.   Past Medical History:  Diagnosis Date   Acute on chronic combined systolic and diastolic CHF (congestive heart failure) (HCC) 11/24/2008   Qualifier: Diagnosis of   By: Johnnette Barrios, CNA, Sandy         Acute renal failure with acute tubular necrosis superimposed on stage 3b chronic kidney disease (HCC) 12/15/2021   Allergy    Aortic stenosis    Arthritis    hands   BPH (benign prostatic hyperplasia)    CHF (congestive heart failure) (HCC)    CKD (chronic kidney disease)    COVID-19    COVID-19 virus infection 12/16/2021   Diabetes mellitus    Dyspnea    DYSPNEA 11/24/2008   Qualifier: Diagnosis of   By: Johnnette Barrios, CNA, Sandy         Hyperlipidemia    Hypertension    Hypertensive urgency 12/10/2021   Myocardial infarction [facility]) 06/10/2002   NSTEMI (non-ST elevated myocardial infarction) (HCC) 12/10/2021   Stroke  (HCC) 09/2020   Some memory deficits   Vertigo    Wears dentures    full upper       Home Medications Prior to Admission medications   Medication Sig Start Date End Date Taking? Authorizing Provider  aspirin EC 81 MG tablet Take 1 tablet by mouth daily. 05/23/22 05/23/23 Yes [provider]  finasteride (PROSCAR) 5 MG tablet Take 1 tablet by mouth daily. 09/05/22  Yes [provider]  amiodarone (PACERONE) 200 MG tablet Take 1 tablet by mouth daily. 04/17/21 04/17/22  [provider]  apixaban Everlene Balls) 5 MG TABS tablet Take by mouth. 03/28/21 03/23/22  [provider]  atorvastatin (LIPITOR) 80 MG tablet Take 80 mg by mouth daily. 12/12/20   [provider]  Cholecalciferol 125 MCG (5000 UT) TABS Take 1 tablet by mouth daily.    [provider]  empagliflozin (JARDIANCE) 10 MG TABS tablet Take 1 tablet by mouth daily. 08/08/21   [provider]  ENTRESTO 49-51 MG Take 1 tablet by mouth 2 (two) times daily.    [provider]  ezetimibe (ZETIA) 10 MG tablet Take 10 mg by mouth daily.    [provider]  furosemide (LASIX) 20 MG tablet Take 1 tablet (20 mg total) by  mouth daily. 09/15/11 12/10/21  Elvina Sidle, MD  glipiZIDE (GLUCOTROL XL) 10 MG 24 hr tablet Take 10 mg by mouth daily. 11/14/21   [provider]  HUMULIN N KWIKPEN 100 UNIT/ML KwikPen Inject 10 Units into the skin daily.    [provider]  losartan (COZAAR) 100 MG tablet Take 1 tablet by mouth daily. 11/29/21 11/29/22  [provider]  metoprolol succinate (TOPROL-XL) 50 MG 24 hr tablet Take 50 mg by mouth daily.    [provider]  metoprolol tartrate (LOPRESSOR) 50 MG tablet Take by mouth. 04/17/21 04/17/22  [provider]  nitroGLYCERIN (NITROSTAT) 0.4 MG SL tablet Place under the tongue. 01/23/22   [provider]      Allergies    Ramipril, Shellfish allergy, and Sulfa antibiotics    Review of  Systems   Review of Systems A 10 point review of systems was performed and is negative unless otherwise reported in HPI.  Physical Exam Updated Vital Signs BP (!) 156/89   Pulse (!) 59   Temp 98.6 F (37 C) (Oral)   Resp 13   SpO2 99%  Physical Exam General: Normal appearing male, lying in bed.  HEENT: PERRLA, Sclera anicteric, MMM, trachea midline.  Cardiology: RRR, no murmurs/rubs/gallops. BL radial and DP pulses equal bilaterally.  Resp: Normal respiratory rate and effort. CTAB, no wheezes, rhonchi, crackles.  Abd: Soft, non-tender, non-distended. No rebound tenderness or guarding.  GU: Deferred. MSK: No peripheral edema or signs of trauma. Extremities without deformity or TTP. No cyanosis or clubbing. Skin: warm, dry.  Neuro: A&Ox4, CNs II-XII grossly intact. 5/5 strength BL UEs, LLE. 3/5 strength RLE. Sensation decreased in RLE. RLE extinction present. Tongue protrudes midline. No facial droop. No slurred speech. Intact repetition.  Psych: Normal mood and affect.   1a  Level of consciousness: 0=alert; keenly responsive  1b. LOC questions:  0=Performs both tasks correctly  1c. LOC commands: 0=Performs both tasks correctly  2.  Best Gaze: 0=normal  3.  Visual: 0=No visual loss  4. Facial Palsy: 0=Normal symmetric movement  5a.  Motor left arm: 0=No drift, limb holds 90 (or 45) degrees for full 10 seconds  5b.  Motor right arm: 0=No drift, limb holds 90 (or 45) degrees for full 10 seconds  6a. motor left leg: 0=No drift, limb holds 90 (or 45) degrees for full 10 seconds  6b  Motor right leg:  2=Some effort against gravity, limb cannot get to or maintain (if cured) 90 (or 45) degrees, drifts down to bed, but has some effort against gravity  7. Limb Ataxia: 1=Present in one limb  8.  Sensory: 1=Mild to moderate sensory loss; patient feels pinprick is less sharp or is dull on the affected side; there is a loss of superficial pain with pinprick but patient is aware He is being  touched  9. Best Language:  0=No aphasia, normal  10. Dysarthria: 0=Normal  11. Extinction and Inattention: 1=Visual, tactile, auditory, spatial or personal inattention or extinction to bilateral simultaneous stimulation in one of the sensory modalities   Total:   5        ED Results / Procedures / Treatments   Labs (all labs ordered are listed, but only abnormal results are displayed) Labs Reviewed  CBC - Abnormal; Notable for the following components:      Result Value   RBC 4.04 (*)    Hemoglobin 12.2 (*)    HCT 36.2 (*)    Platelets 121 (*)  All other components within normal limits  COMPREHENSIVE METABOLIC PANEL - Abnormal; Notable for the following components:   Glucose, Bld 191 (*)    BUN 33 (*)    Creatinine, Ser 3.25 (*)    Total Protein 5.7 (*)    Albumin 3.4 (*)    GFR, Estimated 19 (*)    All other components within normal limits  URINALYSIS, ROUTINE W REFLEX MICROSCOPIC - Abnormal; Notable for the following components:   APPearance HAZY (*)    Glucose, UA >=500 (*)    Protein, ur 100 (*)    Leukocytes,Ua SMALL (*)    Bacteria, UA RARE (*)    All other components within normal limits  I-STAT CHEM 8, ED - Abnormal; Notable for the following components:   BUN 31 (*)    Creatinine, Ser 3.40 (*)    Glucose, Bld 180 (*)    Hemoglobin 11.6 (*)    HCT 34.0 (*)    All other components within normal limits  ETHANOL  PROTIME-INR  APTT  DIFFERENTIAL  RAPID URINE DRUG SCREEN, HOSP PERFORMED  CBG MONITORING, ED    EKG EKG Interpretation Date/Time:  Friday January 24 2023 10:35:33 EDT Ventricular Rate:  64 PR Interval:    QRS Duration:  127 QT Interval:  582 QTC Calculation: 601 R Axis:   63  Text Interpretation: Atrial flutter/fibrillation Ventricular premature complex  Similar T wave changes to prior EKG Confirmed by Vivi Barrack [id number]) on 01/24/2023 2:42:01 PM  Radiology MR BRAIN WO CONTRAST  Result Date: 01/24/2023 CLINICAL DATA:  Neuro deficit  with acute stroke suspected EXAM: MRI HEAD WITHOUT CONTRAST TECHNIQUE: Multiplanar, multiecho pulse sequences of the brain and surrounding structures were obtained without intravenous contrast. COMPARISON:  Head CT from earlier today FINDINGS: Brain: Small acute infarct in the high and left perirolandic cortex. Diffusion alteration along the superior left frontal cortex attributed to susceptibility artifact when correlated with gradient imaging. Advanced chronic small vessel ischemic gliosis in the cerebral white matter with chronic lacunar infarcts in the deep gray nuclei and deep white matter bilaterally. Chronic cortically based infarcts along the left more than right occipital cortex and in the inferior left temporal occipital cortex- site highlighted on prior head CT. Small chronic bilateral cerebellar infarcts. Brain volume is normal for age. 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 No acute hemorrhage, hydrocephalus, or collection. Vascular: Normal flow voids. Skull and upper cervical spine: Normal marrow signal. Sinuses/Orbits: No acute finding IMPRESSION: 1. Small acute infarct in the superior left perirolandic cortex. 2. Widespread chronic ischemic injury as described. Electronically Signed   By: Tiburcio Pea M.D.   On: 01/24/2023 14:20   CT HEAD WO CONTRAST  Result Date: 01/24/2023 CLINICAL DATA:  Right lower extremity weakness/numbness. EXAM: CT HEAD WITHOUT CONTRAST TECHNIQUE: Contiguous axial images were obtained from the base of the skull through the vertex without intravenous contrast. RADIATION DOSE REDUCTION: This exam was performed according to the departmental dose-optimization program which includes automated exposure control, adjustment of the mA and/or kV according to patient size and/or use of iterative reconstruction technique. COMPARISON:  CT head 06/06/2022 FINDINGS: Brain: There is no evidence of acute  intracranial hemorrhage or extra-axial fluid collection. There is hypodensity with loss of gray-white differentiation along the undersurface of the left temporal occipital region which is new since the prior study (6-49, 5-44). Finding is consistent with an interval infarct, possibly subacute Numerous additional remote infarcts in the bilateral occipital lobes, right  frontal cortex, hemispheric white matter, bilateral cerebellar hemispheres, and left basal ganglia are unchanged. Parenchymal volume is stable. The ventricles are stable in size. The pituitary and suprasellar region are normal. There is no mass lesion. There is no mass effect or midline shift. Vascular: There is calcification of the bilateral carotid siphons and vertebral arteries. Skull: Normal. Negative for fracture or focal lesion. Sinuses/Orbits: The paranasal sinuses are clear. Bilateral lens implants are in place. The globes and orbits are otherwise unremarkable. Other: The mastoid air cells and middle ear cavities are clear. IMPRESSION: 1. New hypodensity with loss of gray-white differentiation along the undersurface of the left temporal occipital region consistent with interval infarct, possibly subacute. Consider brain MRI for characterization. 2. No acute intracranial hemorrhage. Electronically Signed   By: Lesia Hausen M.D.   On: 01/24/2023 11:42    Procedures Procedures    Medications Ordered in ED Medications  amiodarone (PACERONE) tablet 200 mg (has no administration in time range)  ezetimibe (ZETIA) tablet 10 mg (has no administration in time range)  metoprolol succinate (TOPROL-XL) 24 hr tablet 50 mg (has no administration in time range)  finasteride (PROSCAR) tablet 5 mg (has no administration in time range)   stroke: early stages of recovery book (has no administration in time range)  acetaminophen (TYLENOL) tablet 650 mg (has no administration in time range)    Or  acetaminophen (TYLENOL) 160 MG/5ML solution 650 mg (has  no administration in time range)    Or  acetaminophen (TYLENOL) suppository 650 mg (has no administration in time range)  senna-docusate (Senokot-S) tablet 1 tablet (has no administration in time range)  0.9 %  sodium chloride infusion (has no administration in time range)    ED Course/ Medical Decision Making/ A&P                          Medical Decision Making Amount and/or Complexity of Data Reviewed Labs: ordered. Decision-making details documented in ED Course. Radiology: ordered. Decision-making details documented in ED Course.  Risk Decision regarding hospitalization.    This patient presents to the ED for concern of RLE weakness/numnbess, this involves an extensive number of treatment options, and is a complaint that carries with it a high risk of complications and morbidity.  I considered the following differential and admission for this acute, potentially life threatening condition.   MDM:    Given the acute onset of neurological symptoms, stroke is the most concerning etiology of these acute symptoms. The neuro exam is significant for RLE weakness, numbness, and extinction. Also consider dissection but no chest or abdominal pain, he has no pain to RLE, and pulses are intact w/ warm perfused extremity. Also consider todd's paralysis, electrolyte derangements, hypo/hyperglycemia, toxic ingestion. He does report he fell last week, could also have ICH. Plan to obtain emergent CT brain.  LKN: 2030 PM yesterday when he went to sleep Glucose: 180 mg/dL AC: supposed to be on eliquis but currently held BP: 130/83   Plan: - stat head CT, consider additional imaging including CTA and perfusion scan pending initial CT  - considered TNK but out of the window - Manage hypertension as needed   Clinical Course as of 01/24/23 1603  Fri Jan 24, 2023  1104 D/w Dr. Selina Cooley who states with leg-only deficits, not an exam c/w LVO and patient does not need CTA imaging, can go to MRI [HN]   1210 Creatinine(!): 3.40 Chronic elevation [HN]  1241 CT HEAD WO  CONTRAST 1. New hypodensity with loss of gray-white differentiation along the undersurface of the left temporal occipital region consistent with interval infarct, possibly subacute. Consider brain MRI for characterization. 2. No acute intracranial hemorrhage.   [HN]  1440 MR BRAIN WO CONTRAST 1. Small acute infarct in the superior left perirolandic cortex. 2. Widespread chronic ischemic injury as described.   [HN]  1441 Messaged w/ neurology [HN]    Clinical Course User Index [HN] Loetta Rough, MD    Labs: I Ordered, and personally interpreted labs.  The pertinent results include: Those listed above  Imaging Studies ordered: I ordered imaging studies including CT head, MRI brain without contrast I independently visualized and interpreted imaging. I agree with the radiologist interpretation  Additional history obtained from chart review  Cardiac Monitoring: The patient was maintained on a cardiac monitor.  I personally viewed and interpreted the cardiac monitored which showed an underlying rhythm of: Afib rate controlled  Reevaluation: After the interventions noted above, I reevaluated the patient and found that they have :stayed the same  Social Determinants of Health:  lives independently  Disposition: Patient with MRI demonstrating acute stroke.  Consulted with neurology Dr. Selina Cooley will be following along.  Admitted to hospitalist Dr. Alinda Money.  Co morbidities that complicate the patient evaluation  Past Medical History:  Diagnosis Date   Acute on chronic combined systolic and diastolic CHF (congestive heart failure) (HCC) 11/24/2008   Qualifier: Diagnosis of   By: Johnnette Barrios, CNA, Sandy         Acute renal failure with acute tubular necrosis superimposed on stage 3b chronic kidney disease (HCC) 12/15/2021   Allergy    Aortic stenosis    Arthritis    hands   BPH (benign prostatic hyperplasia)     CHF (congestive heart failure) (HCC)    CKD (chronic kidney disease)    COVID-19    COVID-19 virus infection 12/16/2021   Diabetes mellitus    Dyspnea    DYSPNEA 11/24/2008   Qualifier: Diagnosis of   By: Johnnette Barrios, CNA, Sandy         Hyperlipidemia    Hypertension    Hypertensive urgency 12/10/2021   Myocardial infarction [facility]) 06/10/2002   NSTEMI (non-ST elevated myocardial infarction) (HCC) 12/10/2021   Stroke (HCC) 09/2020   Some memory deficits   Vertigo    Wears dentures    full upper     Medicines Meds ordered this encounter  Medications   amiodarone (PACERONE) tablet 200 mg   ezetimibe (ZETIA) tablet 10 mg   metoprolol succinate (TOPROL-XL) 24 hr tablet 50 mg   finasteride (PROSCAR) tablet 5 mg    stroke: early stages of recovery book   OR Linked Order Group    acetaminophen (TYLENOL) tablet 650 mg    acetaminophen (TYLENOL) 160 MG/5ML solution 650 mg    acetaminophen (TYLENOL) suppository 650 mg   senna-docusate (Senokot-S) tablet 1 tablet   0.9 %  sodium chloride infusion    I have reviewed the patients home medicines and have made adjustments as needed  Problem List / ED Course: Problem List Items Addressed This Visit   None Visit Diagnoses     Cerebral infarction, unspecified mechanism (HCC)    -  Primary                   This note was created using dictation software, which may contain spelling or grammatical errors.    Loetta Rough, MD 01/24/23 [phone]

## 2023-01-24 NOTE — ED Triage Notes (Signed)
Coming from home. BIB EMS today.   Woke up 830 and noticed weakness in rt foot with numbness. Does drag foot with gait. Last known normal was last night 9:30pm  Additionally was just taken off blood thinners due to dental work  Is complaining of dizziness  Does have some dementia but is at baseline.    Had a fall last week and hit his head but did not get seen for that  137/81 69 96% RA CBG 199  18g Rt AC

## 2023-01-24 NOTE — H&P (Addendum)
History and Physical   ABBEY Holloway ZOX:[id number] DOB: [birth year] DOA: 01/24/2023  PCP: Shane Bushman, MD   Patient coming from: Home  Chief Complaint: Weakness  HPI: Shane Holloway is a 72 y.o. male with medical history significant of stroke, dementia, hypertension, CKD 3B, CAD status post CABG, diabetes, status post aortic valve replacement, chronic combined CHF, obesity, BPH presenting with right lower extremity weakness.  Patient reportedly woke this morning around 9:30 AM with right lower extremity weakness and numbness.  No history of similar but does have history of prior stroke.  Last known normal was 8:30 PM the night before.  Patient is on Eliquis for history of DVT but this has been held recently due to dental procedure.  No history of atrial fibrillation.  Denies fever, chills, chest pain, shortness of breath, abdominal pain, constipation, diarrhea, nausea, vomiting.  ED Course: Vital signs in the ED notable for blood pressure in the 130s to 150s systolic, heart rate in the 50s to 60s.  Lab workup included CMP with BUN 33, creatinine at 3.25 increased from 3 6 days ago and 2.8 a few months ago.  CBC with hemoglobin stable at 12.2.  PT, PTT, INR within normal limits.  Ethanol level negative.  UDS and urinalysis pending.  CT head showed new hypodensity at the left temporal/occipital region.  No acute hematoma.  MRI brain confirmed acute infarct.  Neurology consulted and will see the patient.  Review of Systems: As per HPI otherwise all other systems reviewed and are negative.  Past Medical History:  Diagnosis Date   Acute on chronic combined systolic and diastolic CHF (congestive heart failure) (HCC) 11/24/2008   Qualifier: Diagnosis of   By: Johnnette Barrios, CNA, Sandy         Acute renal failure with acute tubular necrosis superimposed on stage 3b chronic kidney disease (HCC) 12/15/2021   Allergy    Aortic stenosis    Arthritis    hands   BPH (benign prostatic  hyperplasia)    CHF (congestive heart failure) (HCC)    CKD (chronic kidney disease)    COVID-19    COVID-19 virus infection 12/16/2021   Diabetes mellitus    Dyspnea    DYSPNEA 11/24/2008   Qualifier: Diagnosis of   By: Johnnette Barrios, CNA, Sandy         Hyperlipidemia    Hypertension    Hypertensive urgency 12/10/2021   Myocardial infarction [facility]) 06/10/2002   NSTEMI (non-ST elevated myocardial infarction) (HCC) 12/10/2021   Stroke (HCC) 09/2020   Some memory deficits   Vertigo    Wears dentures    full upper    Past Surgical History:  Procedure Laterality Date   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  Redo of 2V CABG, Aortic Root repair and AVR   CATARACT EXTRACTION W/PHACO Right 11/19/2021   Procedure: CATARACT EXTRACTION PHACO AND INTRAOCULAR LENS PLACEMENT (IOC) RIGHT DIABETIC;  Surgeon: Nevada Crane, MD;  Location: [facility];  Service: Ophthalmology;  Laterality: Right;  7.32 0:54.3   CATARACT EXTRACTION W/PHACO Left 12/03/2021   Procedure: CATARACT EXTRACTION PHACO AND INTRAOCULAR LENS PLACEMENT (IOC) LEFT DIABETIC;  Surgeon: Estanislado Pandy, MD;  Location: [facility];  Service: Ophthalmology;  Laterality: Left;  LEAVE FIRST CASE Diabetic 3.74 00:36.3   CORONARY ARTERY BYPASS GRAFT  2009   x4; stents to diagonal a mid right coronary lesion; drug eluting stent in the mid RCA as well as ostium of  the diagonal with a cutting balloon angioplasty of the diagonal.   CORONARY STENT INTERVENTION N/A 12/14/2021   Procedure: CORONARY STENT INTERVENTION;  Surgeon: Iran Ouch, MD;  Location: [facility] INVASIVE CV LAB;  Service: Cardiovascular;  Laterality: N/A;   CORONARY ULTRASOUND/IVUS N/A 12/14/2021   Procedure: Intravascular Ultrasound/IVUS;  Surgeon: Iran Ouch, MD;  Location: [facility] INVASIVE CV LAB;  Service: Cardiovascular;  Laterality: N/A;   JOINT REPLACEMENT     RIGHT HEART CATH AND CORONARY/GRAFT  ANGIOGRAPHY N/A 12/12/2021   Procedure: RIGHT HEART CATH AND CORONARY/GRAFT ANGIOGRAPHY;  Surgeon: Yvonne Kendall, MD;  Location: [facility] INVASIVE CV LAB;  Service: Cardiovascular;  Laterality: N/A;    Social History  reports that he quit smoking about 29 years ago. His smoking use included cigarettes. He started smoking about 33 years ago. He has a 1 pack-year smoking history. He quit smokeless tobacco use about 29 years ago.  His smokeless tobacco use included chew. He reports that he does not drink alcohol and does not use drugs.  Allergies  Allergen Reactions   Ramipril Other (See Comments)    Cough with Altace   Shellfish Allergy Nausea And Vomiting   Sulfa Antibiotics Hives    Family History  Problem Relation Age of Onset   Heart disease Mother    Heart disease Father   Reviewed on admission  Prior to Admission medications   Medication Sig Start Date End Date Taking? Authorizing Provider  aspirin EC 81 MG tablet Take 1 tablet by mouth daily. 05/23/22 05/23/23 Yes [provider]  finasteride (PROSCAR) 5 MG tablet Take 1 tablet by mouth daily. 09/05/22  Yes [provider]  amiodarone (PACERONE) 200 MG tablet Take 1 tablet by mouth daily. 04/17/21 04/17/22  [provider]  apixaban Everlene Balls) 5 MG TABS tablet Take by mouth. 03/28/21 03/23/22  [provider]  atorvastatin (LIPITOR) 80 MG tablet Take 80 mg by mouth daily. 12/12/20   [provider]  Cholecalciferol 125 MCG (5000 UT) TABS Take 1 tablet by mouth daily.    [provider]  empagliflozin (JARDIANCE) 10 MG TABS tablet Take 1 tablet by mouth daily. 08/08/21   [provider]  ENTRESTO 49-51 MG Take 1 tablet by mouth 2 (two) times daily.    [provider]  ezetimibe (ZETIA) 10 MG tablet Take 10 mg by mouth daily.    [provider]  furosemide (LASIX) 20 MG tablet Take 1 tablet (20 mg total) by mouth daily. 09/15/11 12/10/21  Elvina Sidle, MD   glipiZIDE (GLUCOTROL XL) 10 MG 24 hr tablet Take 10 mg by mouth daily. 11/14/21   [provider]  HUMULIN N KWIKPEN 100 UNIT/ML KwikPen Inject 10 Units into the skin daily.    [provider]  losartan (COZAAR) 100 MG tablet Take 1 tablet by mouth daily. 11/29/21 11/29/22  [provider]  metoprolol succinate (TOPROL-XL) 50 MG 24 hr tablet Take 50 mg by mouth daily.    [provider]  metoprolol tartrate (LOPRESSOR) 50 MG tablet Take by mouth. 04/17/21 04/17/22  [provider]  nitroGLYCERIN (NITROSTAT) 0.4 MG SL tablet Place under the tongue. 01/23/22   [provider]    Physical Exam: Vitals:   01/24/23 1034 01/24/23 1035 01/24/23 1415 01/24/23 1425  BP: 130/83 130/83 (!) 156/89   Pulse: 65 64 (!) 59   Resp: 16 16 13    Temp: 98.6 F (37 C)   98.6 F (37 C)  TempSrc: Oral   Oral  SpO2: 95% 95% 99%     Physical Exam Constitutional:      General: He is not in acute distress.    Appearance: Normal appearance.  HENT:     Head: Normocephalic and atraumatic.     Mouth/Throat:     Mouth: Mucous membranes are moist.     Pharynx: Oropharynx is clear.  Eyes:     Extraocular Movements: Extraocular movements intact.     Pupils: Pupils are equal, round, and reactive to light.  Cardiovascular:     Rate and Rhythm: Normal rate and regular rhythm.     Pulses: Normal pulses.     Heart sounds: Normal heart sounds.  Pulmonary:     Effort: Pulmonary effort is normal. No respiratory distress.     Breath sounds: Normal breath sounds.  Abdominal:     General: Bowel sounds are normal. There is no distension.     Palpations: Abdomen is soft.     Tenderness: There is no abdominal tenderness.  Musculoskeletal:        General: No swelling or deformity.  Skin:    General: Skin is warm and dry.  Neurological:     Comments: Mental Status: Patient is awake, alert, oriented No signs of aphasia or neglect Cranial Nerves: II: Pupils equal,  round, and reactive to light.   III,IV, VI: EOMI without ptosis or diploplia.  V: Facial sensation is symmetric to light touch and  Temperature. VII: Facial movement is symmetric.  VIII: hearing is intact to voice X: Uvula elevates symmetrically XI: Shoulder shrug is symmetric. XII: tongue is midline without atrophy or fasciculations.  Motor: Good effort thorughout, at Least 5/5 bilateral UE, 5/5 LLE, 4/5 RLE  Sensory: Sensation is grossly intact bilateral UEs & decreased at right lower extremity.  Cerebellar: Finger-Nose intact bilalat    Labs on Admission: I have personally reviewed following labs and imaging studies  CBC: Recent Labs  Lab 01/18/23 0458 01/19/23 0644 01/24/23 1040 01/24/23 1055  WBC 6.2 7.4 5.4  --   NEUTROABS 4.7 5.6 3.6  --   HGB 14.1 12.7* 12.2* 11.6*  HCT 42.6 39.3 36.2* 34.0*  MCV 86.8 87.7 89.6  --   PLT 137* 141* 121*  --     Basic Metabolic Panel: Recent Labs  Lab 01/18/23 0458 01/19/23 0644 01/24/23 1040 01/24/23 1055  NA 138 138 144 143  K 4.1 4.2 4.3 4.2  CL 104 104 107 108  CO2 19* 22 23  --   GLUCOSE 219* 242* 191* 180*  BUN 31* 50* 33* 31*  CREATININE 3.06* 3.23* 3.25* 3.40*  CALCIUM 8.9 9.1 9.2  --     GFR: Estimated Creatinine Clearance: 24.3 mL/min (A) (by C-G formula based on SCr of 3.4 mg/dL (H)).  Liver Function Tests: Recent Labs  Lab 01/19/23 0644 01/24/23 1040  AST 15 20  ALT 19 28  ALKPHOS 81 83  BILITOT 0.6 0.6  PROT 6.1* 5.7*  ALBUMIN 3.4* 3.4*    Urine analysis:    Component Value Date/Time   COLORURINE YELLOW 10/15/2022 1815   APPEARANCEUR HAZY (A) 10/15/2022 1815   LABSPEC 1.016 10/15/2022 1815   PHURINE 5.0 10/15/2022 1815   GLUCOSEU >=500 (A) 10/15/2022 1815   HGBUR MODERATE (A) 10/15/2022 1815   BILIRUBINUR NEGATIVE 10/15/2022 1815   BILIRUBINUR neg 08/10/2011 1236   KETONESUR NEGATIVE 10/15/2022 1815   PROTEINUR 100 (A) 10/15/2022 1815   UROBILINOGEN 0.2 08/10/2011 1236   UROBILINOGEN 0.2  07/27/2007 1303   NITRITE  NEGATIVE 10/15/2022 1815   LEUKOCYTESUR MODERATE (A) 10/15/2022 1815    Radiological Exams on Admission: MR BRAIN WO CONTRAST  Result Date: 01/24/2023 CLINICAL DATA:  Neuro deficit with acute stroke suspected EXAM: MRI HEAD WITHOUT CONTRAST TECHNIQUE: Multiplanar, multiecho pulse sequences of the brain and surrounding structures were obtained without intravenous contrast. COMPARISON:  Head CT from earlier today FINDINGS: Brain: Small acute infarct in the high and left perirolandic cortex. Diffusion alteration along the superior left frontal cortex attributed to susceptibility artifact when correlated with gradient imaging. Advanced chronic small vessel ischemic gliosis in the cerebral white matter with chronic lacunar infarcts in the deep gray nuclei and deep white matter bilaterally. Chronic cortically based infarcts along the left more than right occipital cortex and in the inferior left temporal occipital cortex- site highlighted on prior head CT. Small chronic bilateral cerebellar infarcts. Brain volume is normal for age. 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 No acute hemorrhage, hydrocephalus, or collection. Vascular: Normal flow voids. Skull and upper cervical spine: Normal marrow signal. Sinuses/Orbits: No acute finding IMPRESSION: 1. Small acute infarct in the superior left perirolandic cortex. 2. Widespread chronic ischemic injury as described. Electronically Signed   By: Tiburcio Pea M.D.   On: 01/24/2023 14:20   CT HEAD WO CONTRAST  Result Date: 01/24/2023 CLINICAL DATA:  Right lower extremity weakness/numbness. EXAM: CT HEAD WITHOUT CONTRAST TECHNIQUE: Contiguous axial images were obtained from the base of the skull through the vertex without intravenous contrast. RADIATION DOSE REDUCTION: This exam was performed according to the departmental dose-optimization program which includes  automated exposure control, adjustment of the mA and/or kV according to patient size and/or use of iterative reconstruction technique. COMPARISON:  CT head 06/06/2022 FINDINGS: Brain: There is no evidence of acute intracranial hemorrhage or extra-axial fluid collection. There is hypodensity with loss of gray-white differentiation along the undersurface of the left temporal occipital region which is new since the prior study (6-49, 5-44). Finding is consistent with an interval infarct, possibly subacute Numerous additional remote infarcts in the bilateral occipital lobes, right frontal cortex, hemispheric white matter, bilateral cerebellar hemispheres, and left basal ganglia are unchanged. Parenchymal volume is stable. The ventricles are stable in size. The pituitary and suprasellar region are normal. There is no mass lesion. There is no mass effect or midline shift. Vascular: There is calcification of the bilateral carotid siphons and vertebral arteries. Skull: Normal. Negative for fracture or focal lesion. Sinuses/Orbits: The paranasal sinuses are clear. Bilateral lens implants are in place. The globes and orbits are otherwise unremarkable. Other: The mastoid air cells and middle ear cavities are clear. IMPRESSION: 1. New hypodensity with loss of gray-white differentiation along the undersurface of the left temporal occipital region consistent with interval infarct, possibly subacute. Consider brain MRI for characterization. 2. No acute intracranial hemorrhage. Electronically Signed   By: Lesia Hausen M.D.   On: 01/24/2023 11:42    EKG: Independently reviewed. Sinus rhythm at 64 bpm with sinus arrhythmia.  PVC noted.  QTc read as prolonged at 601.  Evidence of early/incomplete left bundle branch block with QRS 127.  Similar to previous.  Assessment/Plan Principal Problem:   Acute CVA (cerebrovascular accident) [facility]) Active Problems:   Vascular dementia without behavioral disturbance (HCC)   Chronic kidney  disease, stage 3b (HCC)   S/P aortic valve replacement with bioprosthetic valve   Uncontrolled type 2 diabetes mellitus with hyperglycemia, without long-term current use of insulin (HCC)   Benign prostatic  hyperplasia   Obesity (BMI 30-39.9)   Essential hypertension   CORONARY ATHEROSCLEROSIS, ARTERY BYPASS GRAFT   History of stroke   HFrEF (heart failure with reduced ejection fraction) (HCC)   Acute CVA History of prior CVA > Presented after waking up with right lower extremity weakness and numbness.  Last normal was 8:30 PM the night before.  Symptoms have been persistent. > Abnormality in the left temporal/occipital region on CT head but no acute bleeding.  MRI brain confirmed acute infarct. > Saw hematology 03/2021 and given unprovoked VTE and concern for embolic strokes, they favored long-term anticoagulation. He has remained on anticoagulation since then. Has been held recently for dental procedure, etiology of CVA may be related. > Neurology consulted and will be following. - Appreciate neurology recommendations - Allow for permissive HTN (systolic < 220 and diastolic < 120) - Continue home aspirin and Zetia - Consider statin - Echocardiogram  - MRA - A1C  - Lipid panel  - Tele monitoring  - SLP eval - PT/OT  Hypertension CHF > Last echo in our system in 2023 with EF 35-40%, G1 DD, normal RV function. 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 Saw hematology 03/2021 and given unprovoked VTE and concern for embolic strokes, they favored  long-term anticoagulation. He has remained on anticoagulation since then. - Resume home Eliquis  Dementia - Noted  Diabetes - SSI  BPH - Continue home finasteride  Obesity History of aortic valve replacement in 2015 - Noted  DVT prophylaxis: Eliquis Code Status:   Full Family Communication:  None on admission  Disposition Plan:   Patient is from:  Home  Anticipated DC to:  Pending clinical course  Anticipated DC date:  1 to 3 days  Anticipated DC barriers: None  Consults called:  Neurology consulted in the ED Admission status:  Observation, telemetry  Severity of Illness: The appropriate patient status for this patient is OBSERVATION. Observation status is judged to be reasonable and necessary in order to provide the required intensity of service to ensure the patient's safety. The patient's presenting symptoms, physical exam findings, and initial radiographic and laboratory data in the context of their medical condition is felt to place them at decreased risk for further clinical deterioration. Furthermore, it is anticipated that the patient will be medically stable for discharge from the hospital within 2 midnights of admission.    Synetta Fail MD [facility]  How to contact the [facility] Attending or Consulting provider 7A - 7P or covering provider during after hours 7P -7A, for this patient?   Check the care team in [facility] and look for a) attending/consulting [facility] provider listed and b) the [facility] team listed Log into [email or web address] and use [facility] universal password to access. If you do not have the password, please contact the hospital operator. Locate the [facility] provider you are looking for under [facility] and page to a number that you can be directly reached. If you still have difficulty reaching the provider, please page the [facility] (Director on Call) for the Hospitalists listed on amion  for assistance.  01/24/2023, 3:56 PM

## 2023-01-24 NOTE — Consult Note (Signed)
Neurology Consultation  Reason for Consult: Small acute left perirolandic stroke Referring Physician: Dr. Alinda Money  CC: Right leg weakness and numbness  History is obtained from: Patient and chart  HPI: Shane Holloway is a 72 y.o. male with history of dementia, CHF, chronic renal failure, bioprosthetic aortic valve, DVT on Eliquis paused for recent dental procedure, BPH, diabetes, hypertension, hyperlipidemia, non-STEMI and 3 strokes who presents with right leg weakness and numbness which he first noticed when he woke up this morning at 9:30 AM.  His last known normal time was 8:30 PM when he went to sleep last night.  He states that about a week ago he had a cold and then fell and hit his head but did not obtain medical help for this.  He normally takes Eliquis, but this medication has been on hold due to bleeding after a dental procedure this week.  Patient is unsure how long Eliquis has been on hold but states it has been at least a few days.  He states he has no current bleeding from his mouth.   LKW: 8/15 2030 TNK given?: no, outside of window IR Thrombectomy? No, completed stroke on MRI Modified Rankin Scale: 2-Slight disability-UNABLE to perform all activities but does not need assistance  ROS: A complete ROS was performed and is negative except as noted in the HPI.   Past Medical History:  Diagnosis Date   Acute on chronic combined systolic and diastolic CHF (congestive heart failure) (HCC) 11/24/2008   Qualifier: Diagnosis of   By: Johnnette Barrios, CNA, Sandy         Acute renal failure with acute tubular necrosis superimposed on stage 3b chronic kidney disease (HCC) 12/15/2021   Allergy    Aortic stenosis    Arthritis    hands   BPH (benign prostatic hyperplasia)    CHF (congestive heart failure) (HCC)    CKD (chronic kidney disease)    COVID-19    COVID-19 virus infection 12/16/2021   Diabetes mellitus    Dyspnea    DYSPNEA 11/24/2008   Qualifier: Diagnosis of   By: Johnnette Barrios,  CNA, Sandy         Hyperlipidemia    Hypertension    Hypertensive urgency 12/10/2021   Myocardial infarction [facility]) 06/10/2002   NSTEMI (non-ST elevated myocardial infarction) (HCC) 12/10/2021   Stroke (HCC) 09/2020   Some memory deficits   Vertigo    Wears dentures    full upper     Family History  Problem Relation Age of Onset   Heart disease Mother    Heart disease Father      Social History:   reports that he quit smoking about 29 years ago. His smoking use included cigarettes. He started smoking about 33 years ago. He has a 1 pack-year smoking history. He quit smokeless tobacco use about 29 years ago.  His smokeless tobacco use included chew. He reports that he does not drink alcohol and does not use drugs.  Medications  Current Facility-Administered Medications:    [START ON 01/25/2023]  stroke: early stages of recovery book, , Does not apply, Once, Synetta Fail, MD   0.9 %  sodium chloride infusion, , Intravenous, Continuous, Synetta Fail, MD, Last Rate: 75 mL/hr at 01/24/23 1623, New Bag at 01/24/23 1623   acetaminophen (TYLENOL) tablet 650 mg, 650 mg, Oral, Q4H PRN **OR** acetaminophen (TYLENOL) 160 MG/5ML solution 650 mg, 650 mg, Per Tube, Q4H PRN **OR** acetaminophen (TYLENOL) suppository 650 mg, 650 mg, Rectal, Q4H  PRN, Synetta Fail, MD   [START ON 01/25/2023] amiodarone (PACERONE) tablet 200 mg, 200 mg, Oral, Daily, Synetta Fail, MD   aspirin chewable tablet 81 mg, 81 mg, Oral, Daily, de La Torre, Easton E, NP   [START ON 01/25/2023] ezetimibe (ZETIA) tablet 10 mg, 10 mg, Oral, Daily, Synetta Fail, MD   [START ON 01/25/2023] finasteride (PROSCAR) tablet 5 mg, 5 mg, Oral, Daily, Synetta Fail, MD   [START ON 01/25/2023] metoprolol succinate (TOPROL-XL) 24 hr tablet 50 mg, 50 mg, Oral, Daily, Synetta Fail, MD   senna-docusate (Senokot-S) tablet 1 tablet, 1 tablet, Oral, QHS PRN, Synetta Fail, MD  Current Outpatient  Medications:    aspirin EC 81 MG tablet, Take 1 tablet by mouth daily., Disp: , Rfl:    finasteride (PROSCAR) 5 MG tablet, Take 1 tablet by mouth daily., Disp: , Rfl:    amiodarone (PACERONE) 200 MG tablet, Take 1 tablet by mouth daily., Disp: , Rfl:    apixaban (ELIQUIS) 5 MG TABS tablet, Take by mouth., Disp: , Rfl:    atorvastatin (LIPITOR) 80 MG tablet, Take 80 mg by mouth daily., Disp: , Rfl:    Cholecalciferol 125 MCG (5000 UT) TABS, Take 1 tablet by mouth daily., Disp: , Rfl:    empagliflozin (JARDIANCE) 10 MG TABS tablet, Take 1 tablet by mouth daily., Disp: , Rfl:    ENTRESTO 49-51 MG, Take 1 tablet by mouth 2 (two) times daily., Disp: , Rfl:    ezetimibe (ZETIA) 10 MG tablet, Take 10 mg by mouth daily., Disp: , Rfl:    furosemide (LASIX) 20 MG tablet, Take 1 tablet (20 mg total) by mouth daily., Disp: 30 tablet, Rfl: 3   glipiZIDE (GLUCOTROL XL) 10 MG 24 hr tablet, Take 10 mg by mouth daily., Disp: , Rfl:    HUMULIN N KWIKPEN 100 UNIT/ML KwikPen, Inject 10 Units into the skin daily., Disp: , Rfl:    losartan (COZAAR) 100 MG tablet, Take 1 tablet by mouth daily., Disp: , Rfl:    metoprolol succinate (TOPROL-XL) 50 MG 24 hr tablet, Take 50 mg by mouth daily., Disp: , Rfl:    metoprolol tartrate (LOPRESSOR) 50 MG tablet, Take by mouth., Disp: , Rfl:    nitroGLYCERIN (NITROSTAT) 0.4 MG SL tablet, Place under the tongue., Disp: , Rfl:    Exam: Current vital signs: BP (!) 156/89   Pulse (!) 59   Temp 98.6 F (37 C) (Oral)   Resp 13   SpO2 99%  Vital signs in last 24 hours: Temp:  [98.6 F (37 C)] 98.6 F (37 C) (08/16 1425) Pulse Rate:  [59-65] 59 (08/16 1415) Resp:  [13-16] 13 (08/16 1415) BP: (130-156)/(83-89) 156/89 (08/16 1415) SpO2:  [95 %-99 %] 99 % (08/16 1415)  GENERAL: Awake, alert, in no acute distress Psych: Affect appropriate for situation, patient is calm and cooperative with examination Head: Normocephalic and atraumatic, without obvious abnormality EENT:  Normal conjunctivae, moist mucous membranes, no OP obstruction LUNGS: Normal respiratory effort. Non-labored breathing on room air CV: Regular rate and rhythm on telemetry ABDOMEN: Soft, non-tender, non-distended Extremities: warm, well perfused, without obvious deformity  NEURO:  Mental Status: Awake, alert, and oriented to person, place, time, and situation. He is able to provide a clear and coherent history of present illness. Speech/Language: speech is clear and fluent.   Naming, fluency, and comprehension intact without aphasia  No neglect is noted Cranial Nerves:  II: PERRL visual fields full.  III, IV,  VI: EOMI. Lid elevation symmetric and full.  V: Sensation is intact to light touch and symmetrical to face.  VII: Face is symmetric resting and smiling.  VIII: Hearing intact to voice IX, X: Phonation normal.  XI: Normal sternocleidomastoid and trapezius muscle strength XII: Tongue protrudes midline without fasciculations.   Motor: 5/5 strength to bilateral upper extremities and left lower extremities, 4+/5 strength to right lower extremity Tone is normal. Bulk is normal.  Sensation: Intact to light touch bilaterally in all four extremities but diminished in right lower extremity.  Extinction to DSS present in right lower extremity Coordination: FTN intact bilaterally. No pronator drift.  Gait: Deferred  NIHSS: 1a Level of Conscious.: 0 1b LOC Questions: 1 1c LOC Commands: 0 2 Best Gaze: 0 3 Visual: 0 4 Facial Palsy: 0 5a Motor Arm - left: 0 5b Motor Arm - Right: 0 6a Motor Leg - Left: 0 6b Motor Leg - Right: 1 7 Limb Ataxia: 0 8 Sensory: 1 9 Best Language: 0 10 Dysarthria: 0 11 Extinct. and Inatten.: 1 TOTAL: 4   Labs I have reviewed labs in epic and the results pertinent to this consultation are:   CBC    Component Value Date/Time   WBC 5.4 01/24/2023 1040   RBC 4.04 (L) 01/24/2023 1040   HGB 11.6 (L) 01/24/2023 1055   HCT 34.0 (L) 01/24/2023 1055   PLT  121 (L) 01/24/2023 1040   MCV 89.6 01/24/2023 1040   MCV 89.1 10/21/2012 1405   MCH 30.2 01/24/2023 1040   MCHC 33.7 01/24/2023 1040   RDW 13.8 01/24/2023 1040   LYMPHSABS 1.1 01/24/2023 1040   MONOABS 0.5 01/24/2023 1040   EOSABS 0.2 01/24/2023 1040   BASOSABS 0.1 01/24/2023 1040    CMP     Component Value Date/Time   NA 143 01/24/2023 1055   K 4.2 01/24/2023 1055   CL 108 01/24/2023 1055   CO2 23 01/24/2023 1040   GLUCOSE 180 (H) 01/24/2023 1055   BUN 31 (H) 01/24/2023 1055   CREATININE 3.40 (H) 01/24/2023 1055   CREATININE 0.79 09/23/2011 1201   CALCIUM 9.2 01/24/2023 1040   PROT 5.7 (L) 01/24/2023 1040   ALBUMIN 3.4 (L) 01/24/2023 1040   AST 20 01/24/2023 1040   ALT 28 01/24/2023 1040   ALKPHOS 83 01/24/2023 1040   BILITOT 0.6 01/24/2023 1040   GFRNONAA 19 (L) 01/24/2023 1040   GFRAA  03/23/2010 1000    >60        The eGFR has been calculated using the MDRD equation. This calculation has not been validated in all clinical situations. eGFR's persistently <60 mL/min signify possible Chronic Kidney Disease.    Lipid Panel     Component Value Date/Time   CHOL 171 12/11/2021 0528   TRIG 509 (H) 12/11/2021 0528   HDL 28 (L) 12/11/2021 0528   CHOLHDL 6.1 12/11/2021 0528   VLDL UNABLE TO CALCULATE IF TRIGLYCERIDE OVER 400 mg/dL 16/03/9603 5409   LDLCALC UNABLE TO CALCULATE IF TRIGLYCERIDE OVER 400 mg/dL 81/19/1478 2956   LDLDIRECT 58.1 12/11/2021 0528     Imaging I have reviewed the images obtained:  CT-scan of the brain: New hypodensity with loss of gray-white differentiation along the undersurface of the left temporal occipital region  MRI examination of the brain: Small acute infarct in the superior left perirolandic cortex  MRA head: Pending  Assessment: 72 year old patient with history of dementia, CHF, chronic renal failure, bioprosthetic aortic valve replacement, DVT on Eliquis positive for recent dental  procedure, BPH, diabetes, hypertension,  hyperlipidemia, non-STEMI and 3 strokes presents with new onset right leg numbness and numbness which he first noticed when he woke up this morning.  MRI reveals a small acute infarct in superior left perirolandic cortex.  Patient presented outside of the window for TNK and had completed stroke on MRI.  He takes Eliquis chronically for DVT, but this was held due to bleeding after a dental procedure this week.  He denies bleeding at this time, so will restart Eliquis and admit for full stroke workup.    Impression: Acute ischemic stroke, likely due to holding of Eliquis for dental procedure  Recommendations: Stroke/TIA Workup  - Admit for stroke workup - Permissive HTN x48 hrs from sx onset goal BP <220/110. PRN labetalol or hydralazine if BP above these parameters. Avoid oral antihypertensives. - MRA head and carotid ultrasound - TTE w/ bubble - Check A1c and LDL + add statin per guidelines -Restart home Eliquis - q4 hr neuro checks - STAT head CT for any change in neuro exam - Tele - PT/OT/SLP - Stroke education - Amb referral to neurology upon discharge    Pt seen by NP/Neuro and later by MD. Note/plan to be edited by MD as needed.  Cortney E Ernestina Columbia , MSN, AGACNP-BC [facility] See Amion for schedule and pager information 01/24/2023 5:27 PM   Attending Neurohospitalist Addendum Patient seen and examined with APP/Resident. Agree with the history and physical as documented above. Agree with the plan as documented, which I helped formulate. I have edited the note above to reflect my full findings and recommendations. I have independently reviewed the chart, obtained history, review of systems and examined the patient.I have personally reviewed pertinent head/neck/spine imaging (CT/MRI). Please feel free to call with any questions.  -- Bing Neighbors, MD [facility] [phone]  If 7pm- 7am, please page neurology on call as listed in AMION.

## 2023-01-24 NOTE — ED Notes (Signed)
ED TO INPATIENT HANDOFF REPORT  ED Nurse Name and Phone #: Dorreen Valiente [phone]  S Name/Age/Gender Shane Holloway Duty 72 y.o. male Room/Bed: 022C/022C  Code Status   Code Status: Full Code  Home/SNF/Other Home Patient oriented to: self, place, time, and situation Is this baseline? Yes   Triage Complete: Triage complete  Chief Complaint Acute CVA (cerebrovascular accident) [facility]) [I63.9]  Triage Note Coming from home. BIB EMS today.   Woke up 830 and noticed weakness in rt foot with numbness. Does drag foot with gait. Last known normal was last night 9:30pm  Additionally was just taken off blood thinners due to dental work  Is complaining of dizziness  Does have some dementia but is at baseline.    Had a fall last week and hit his head but did not get seen for that  137/81 69 96% RA CBG 199  18g Rt AC    Allergies Allergies  Allergen Reactions   Ramipril Other (See Comments)    Cough with Altace   Shellfish Allergy Nausea And Vomiting   Sulfa Antibiotics Hives    Level of Care/Admitting Diagnosis ED Disposition     ED Disposition  Admit   Condition  --   Comment  Hospital Area: [facility] [[id number]]  Level of Care: Telemetry Medical [[id number]]  May place patient in observation at [facility] or [facility] if equivalent level of care is available:: No  Covid Evaluation: Asymptomatic - no recent exposure (last 10 days) testing not required  Diagnosis: Acute CVA (cerebrovascular accident) [facility]) [[id number]]  Admitting Physician: Synetta Fail [[id number]]  Attending Physician: Synetta Fail [[id number]]          B Medical/Surgery History Past Medical History:  Diagnosis Date   Acute on chronic combined systolic and diastolic CHF (congestive heart failure) (HCC) 11/24/2008   Qualifier: Diagnosis of   By: Johnnette Barrios, CNA, Sandy         Acute renal failure with acute tubular necrosis superimposed on stage 3b chronic kidney disease (HCC)  12/15/2021   Allergy    Aortic stenosis    Arthritis    hands   BPH (benign prostatic hyperplasia)    CHF (congestive heart failure) (HCC)    CKD (chronic kidney disease)    COVID-19    COVID-19 virus infection 12/16/2021   Diabetes mellitus    Dyspnea    DYSPNEA 11/24/2008   Qualifier: Diagnosis of   By: Johnnette Barrios, CNA, Sandy         Hyperlipidemia    Hypertension    Hypertensive urgency 12/10/2021   Myocardial infarction [facility]) 06/10/2002   NSTEMI (non-ST elevated myocardial infarction) (HCC) 12/10/2021   Stroke (HCC) 09/2020   Some memory deficits   Vertigo    Wears dentures    full upper   Past Surgical History:  Procedure Laterality Date   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  Redo of 2V CABG, Aortic Root repair and AVR   CATARACT EXTRACTION W/PHACO Right 11/19/2021   Procedure: CATARACT EXTRACTION PHACO AND INTRAOCULAR LENS PLACEMENT (IOC) RIGHT DIABETIC;  Surgeon: Nevada Crane, MD;  Location: [facility];  Service: Ophthalmology;  Laterality: Right;  7.32 0:54.3   CATARACT EXTRACTION W/PHACO Left 12/03/2021   Procedure: CATARACT EXTRACTION PHACO AND INTRAOCULAR LENS PLACEMENT (IOC) LEFT DIABETIC;  Surgeon: Estanislado Pandy, MD;  Location: [facility];  Service: Ophthalmology;  Laterality: Left;  LEAVE FIRST CASE Diabetic 3.74 00:36.3  CORONARY ARTERY BYPASS GRAFT  2009   x4; stents to diagonal a mid right coronary lesion; drug eluting stent in the mid RCA as well as ostium of the diagonal with a cutting balloon angioplasty of the diagonal.   CORONARY STENT INTERVENTION N/A 12/14/2021   Procedure: CORONARY STENT INTERVENTION;  Surgeon: Iran Ouch, MD;  Location: [facility] INVASIVE CV LAB;  Service: Cardiovascular;  Laterality: N/A;   CORONARY ULTRASOUND/IVUS N/A 12/14/2021   Procedure: Intravascular Ultrasound/IVUS;  Surgeon: Iran Ouch, MD;  Location: [facility] INVASIVE CV LAB;  Service:  Cardiovascular;  Laterality: N/A;   JOINT REPLACEMENT     RIGHT HEART CATH AND CORONARY/GRAFT ANGIOGRAPHY N/A 12/12/2021   Procedure: RIGHT HEART CATH AND CORONARY/GRAFT ANGIOGRAPHY;  Surgeon: Yvonne Kendall, MD;  Location: [facility] INVASIVE CV LAB;  Service: Cardiovascular;  Laterality: N/A;     A IV Location/Drains/Wounds Patient Lines/Drains/Airways Status     Active Line/Drains/Airways     Name Placement date Placement time Site Days   Peripheral IV 18 G Right Antecubital --  --  Antecubital  --            Intake/Output Last 24 hours No intake or output data in the 24 hours ending 01/24/23 1656  Labs/Imaging Results for orders placed or performed during the hospital encounter of 01/24/23 (from the past 48 hour(s))  Ethanol     Status: None   Collection Time: 01/24/23 10:40 AM  Result Value Ref Range   Alcohol, Ethyl (B) <10 <10 mg/dL    Comment: (NOTE) Lowest detectable limit for serum alcohol is 10 mg/dL.  For medical purposes only. Performed at [facility], [address] [place], [place] [address]   Protime-INR     Status: None   Collection Time: 01/24/23 10:40 AM  Result Value Ref Range   Prothrombin Time 13.8 11.4 - 15.2 seconds   INR 1.0 0.8 - 1.2    Comment: (NOTE) INR goal varies based on device and disease states. Performed at [facility], [address], [place], [place] [address]   APTT     Status: None   Collection Time: 01/24/23 10:40 AM  Result Value Ref Range   aPTT 28 24 - 36 seconds    Comment: Performed at [facility], [address], [place], [place] [address]  CBC     Status: Abnormal   Collection Time: 01/24/23 10:40 AM  Result Value Ref Range   WBC 5.4 4.0 - 10.5 K/uL   RBC 4.04 (L) 4.22 - 5.81 MIL/uL   Hemoglobin 12.2 (L) 13.0 - 17.0 g/dL   HCT 99.8 (L) 33.8 - 25.0 %   MCV 89.6 80.0 - 100.0 fL   MCH 30.2 26.0 - 34.0 pg   MCHC 33.7 30.0 - 36.0 g/dL   RDW 53.9 76.7 - 34.1 %   Platelets 121 (L) 150 - 400 K/uL     Comment: REPEATED TO VERIFY   nRBC 0.0 0.0 - 0.2 %    Comment: Performed at [facility], [address], [place], [place] [address]  Differential     Status: None   Collection Time: 01/24/23 10:40 AM  Result Value Ref Range   Neutrophils Relative % 67 %   Neutro Abs 3.6 1.7 - 7.7 K/uL   Lymphocytes Relative 19 %   Lymphs Abs 1.1 0.7 - 4.0 K/uL   Monocytes Relative 9 %   Monocytes Absolute 0.5 0.1 - 1.0 K/uL   Eosinophils Relative 3 %  Eosinophils Absolute 0.2 0.0 - 0.5 K/uL   Basophils Relative 1 %   Basophils Absolute 0.1 0.0 - 0.1 K/uL   Immature Granulocytes 1 %   Abs Immature Granulocytes 0.04 0.00 - 0.07 K/uL    Comment: Performed at [facility], [address], [place], [place] [address]  Comprehensive metabolic panel     Status: Abnormal   Collection Time: 01/24/23 10:40 AM  Result Value Ref Range   Sodium 144 135 - 145 mmol/L   Potassium 4.3 3.5 - 5.1 mmol/L   Chloride 107 98 - 111 mmol/L   CO2 23 22 - 32 mmol/L   Glucose, Bld 191 (H) 70 - 99 mg/dL    Comment: Glucose reference range applies only to samples taken after fasting for at least 8 hours.   BUN 33 (H) 8 - 23 mg/dL   Creatinine, Ser 6.04 (H) 0.61 - 1.24 mg/dL   Calcium 9.2 8.9 - 54.0 mg/dL   Total Protein 5.7 (L) 6.5 - 8.1 g/dL   Albumin 3.4 (L) 3.5 - 5.0 g/dL   AST 20 15 - 41 U/L   ALT 28 0 - 44 U/L   Alkaline Phosphatase 83 38 - 126 U/L   Total Bilirubin 0.6 0.3 - 1.2 mg/dL   GFR, Estimated 19 (L) >60 mL/min    Comment: (NOTE) Calculated using the CKD-EPI Creatinine Equation (2021)    Anion gap 14 5 - 15    Comment: Performed at [facility], [address], [place], [place] [address]  Urine rapid drug screen (hosp performed)     Status: None   Collection Time: 01/24/23 10:40 AM  Result Value Ref Range   Opiates NONE DETECTED NONE DETECTED   Cocaine NONE DETECTED NONE DETECTED   Benzodiazepines NONE DETECTED NONE DETECTED   Amphetamines NONE DETECTED NONE DETECTED    Tetrahydrocannabinol NONE DETECTED NONE DETECTED   Barbiturates NONE DETECTED NONE DETECTED    Comment: (NOTE) DRUG SCREEN FOR MEDICAL PURPOSES ONLY.  IF CONFIRMATION IS NEEDED FOR ANY PURPOSE, NOTIFY LAB WITHIN 5 DAYS.  LOWEST DETECTABLE LIMITS FOR URINE DRUG SCREEN Drug Class                     Cutoff (ng/mL) Amphetamine and metabolites    1000 Barbiturate and metabolites    200 Benzodiazepine                 200 Opiates and metabolites        300 Cocaine and metabolites        300 THC                            50 Performed at [facility], [address] [place], [place] [address]   Urinalysis, Routine w reflex microscopic -Urine, Clean Catch     Status: Abnormal   Collection Time: 01/24/23 10:40 AM  Result Value Ref Range   Color, Urine YELLOW YELLOW   APPearance HAZY (A) CLEAR   Specific Gravity, Urine 1.012 1.005 - 1.030   pH 6.0 5.0 - 8.0   Glucose, UA >=500 (A) NEGATIVE mg/dL   Hgb urine dipstick NEGATIVE NEGATIVE   Bilirubin Urine NEGATIVE NEGATIVE   Ketones, ur NEGATIVE NEGATIVE mg/dL   Protein, ur 956 (A) NEGATIVE mg/dL   Nitrite NEGATIVE NEGATIVE   Leukocytes,Ua SMALL (A) NEGATIVE   RBC / HPF 0-5 0 - 5 RBC/hpf   WBC,  UA >50 0 - 5 WBC/hpf   Bacteria, UA RARE (A) NONE SEEN   Squamous Epithelial / HPF 0-5 0 - 5 /HPF    Comment: Performed at [facility], [address] [place], [place] [address]  I-stat chem 8, ED     Status: Abnormal   Collection Time: 01/24/23 10:55 AM  Result Value Ref Range   Sodium 143 135 - 145 mmol/L   Potassium 4.2 3.5 - 5.1 mmol/L   Chloride 108 98 - 111 mmol/L   BUN 31 (H) 8 - 23 mg/dL   Creatinine, Ser 1.91 (H) 0.61 - 1.24 mg/dL   Glucose, Bld 478 (H) 70 - 99 mg/dL    Comment: Glucose reference range applies only to samples taken after fasting for at least 8 hours.   Calcium, Ion 1.25 1.15 - 1.40 mmol/L   TCO2 23 22 - 32 mmol/L   Hemoglobin 11.6 (L) 13.0 - 17.0 g/dL   HCT 29.5 (L) 62.1 - 30.8 %   MR BRAIN WO  CONTRAST  Result Date: 01/24/2023 CLINICAL DATA:  Neuro deficit with acute stroke suspected EXAM: MRI HEAD WITHOUT CONTRAST TECHNIQUE: Multiplanar, multiecho pulse sequences of the brain and surrounding structures were obtained without intravenous contrast. COMPARISON:  Head CT from earlier today FINDINGS: Brain: Small acute infarct in the high and left perirolandic cortex. Diffusion alteration along the superior left frontal cortex attributed to susceptibility artifact when correlated with gradient imaging. Advanced chronic small vessel ischemic gliosis in the cerebral white matter with chronic lacunar infarcts in the deep gray nuclei and deep white matter bilaterally. Chronic cortically based infarcts along the left more than right occipital cortex and in the inferior left temporal occipital cortex- site highlighted on prior head CT. Small chronic bilateral cerebellar infarcts. Brain volume is normal for age. 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 No acute hemorrhage, hydrocephalus, or collection. Vascular: Normal flow voids. Skull and upper cervical spine: Normal marrow signal. Sinuses/Orbits: No acute finding IMPRESSION: 1. Small acute infarct in the superior left perirolandic cortex. 2. Widespread chronic ischemic injury as described. Electronically Signed   By: Tiburcio Pea M.D.   On: 01/24/2023 14:20   CT HEAD WO CONTRAST  Result Date: 01/24/2023 CLINICAL DATA:  Right lower extremity weakness/numbness. EXAM: CT HEAD WITHOUT CONTRAST TECHNIQUE: Contiguous axial images were obtained from the base of the skull through the vertex without intravenous contrast. RADIATION DOSE REDUCTION: This exam was performed according to the departmental dose-optimization program which includes automated exposure control, adjustment of the mA and/or kV according to patient size and/or use of iterative reconstruction technique. COMPARISON:  CT  head 06/06/2022 FINDINGS: Brain: There is no evidence of acute intracranial hemorrhage or extra-axial fluid collection. There is hypodensity with loss of gray-white differentiation along the undersurface of the left temporal occipital region which is new since the prior study (6-49, 5-44). Finding is consistent with an interval infarct, possibly subacute Numerous additional remote infarcts in the bilateral occipital lobes, right frontal cortex, hemispheric white matter, bilateral cerebellar hemispheres, and left basal ganglia are unchanged. Parenchymal volume is stable. The ventricles are stable in size. The pituitary and suprasellar region are normal. There is no mass lesion. There is no mass effect or midline shift. Vascular: There is calcification of the bilateral carotid siphons and vertebral arteries. Skull: Normal. Negative for fracture or focal lesion. Sinuses/Orbits: The paranasal sinuses are clear. Bilateral lens implants are in place. The globes and orbits are otherwise  unremarkable. Other: The mastoid air cells and middle ear cavities are clear. IMPRESSION: 1. New hypodensity with loss of gray-white differentiation along the undersurface of the left temporal occipital region consistent with interval infarct, possibly subacute. Consider brain MRI for characterization. 2. No acute intracranial hemorrhage. Electronically Signed   By: Lesia Hausen M.D.   On: 01/24/2023 11:42    Pending Labs Unresulted Labs (From admission, onward)     Start     Ordered   01/25/23 0500  Lipid panel  (Labs)  Tomorrow morning,   R       Comments: Fasting    01/24/23 1556   01/25/23 0500  Hemoglobin A1c  (Labs)  Tomorrow morning,   R       Comments: To assess prior glycemic control    01/24/23 1556   01/25/23 0500  Comprehensive metabolic panel  (Labs)  Tomorrow morning,   R        01/24/23 1556   01/25/23 0500  CBC  (Labs)  Tomorrow morning,   R        01/24/23 1556            Vitals/Pain Today's Vitals    01/24/23 1034 01/24/23 1035 01/24/23 1415 01/24/23 1425  BP: 130/83 130/83 (!) 156/89   Pulse: 65 64 (!) 59   Resp: 16 16 13    Temp: 98.6 F (37 C)   98.6 F (37 C)  TempSrc: Oral   Oral  SpO2: 95% 95% 99%   PainSc:        Isolation Precautions No active isolations  Medications Medications  amiodarone (PACERONE) tablet 200 mg (has no administration in time range)  ezetimibe (ZETIA) tablet 10 mg (has no administration in time range)  metoprolol succinate (TOPROL-XL) 24 hr tablet 50 mg (has no administration in time range)  finasteride (PROSCAR) tablet 5 mg (has no administration in time range)   stroke: early stages of recovery book (has no administration in time range)  acetaminophen (TYLENOL) tablet 650 mg (has no administration in time range)    Or  acetaminophen (TYLENOL) 160 MG/5ML solution 650 mg (has no administration in time range)    Or  acetaminophen (TYLENOL) suppository 650 mg (has no administration in time range)  senna-docusate (Senokot-S) tablet 1 tablet (has no administration in time range)  0.9 %  sodium chloride infusion ( Intravenous New Bag/Given 01/24/23 1623)  aspirin chewable tablet 81 mg (has no administration in time range)    Mobility non-ambulatory     Focused Assessments    R Recommendations: See Admitting Provider Note  Report given to:   Additional Notes:

## 2023-01-24 NOTE — ED Notes (Signed)
Gave patient a sandwich and diet cola after doctor stated he was okay to eat

## 2023-01-25 ENCOUNTER — Inpatient Hospital Stay (HOSPITAL_COMMUNITY): Payer: Medicare HMO

## 2023-01-25 DIAGNOSIS — N400 Enlarged prostate without lower urinary tract symptoms: Secondary | ICD-10-CM | POA: Diagnosis present

## 2023-01-25 DIAGNOSIS — N184 Chronic kidney disease, stage 4 (severe): Secondary | ICD-10-CM | POA: Diagnosis present

## 2023-01-25 DIAGNOSIS — I6389 Other cerebral infarction: Secondary | ICD-10-CM | POA: Diagnosis not present

## 2023-01-25 DIAGNOSIS — Z86718 Personal history of other venous thrombosis and embolism: Secondary | ICD-10-CM

## 2023-01-25 DIAGNOSIS — Z8249 Family history of ischemic heart disease and other diseases of the circulatory system: Secondary | ICD-10-CM | POA: Diagnosis not present

## 2023-01-25 DIAGNOSIS — I63522 Cerebral infarction due to unspecified occlusion or stenosis of left anterior cerebral artery: Secondary | ICD-10-CM | POA: Diagnosis not present

## 2023-01-25 DIAGNOSIS — I639 Cerebral infarction, unspecified: Secondary | ICD-10-CM

## 2023-01-25 DIAGNOSIS — Z7901 Long term (current) use of anticoagulants: Secondary | ICD-10-CM | POA: Diagnosis not present

## 2023-01-25 DIAGNOSIS — I130 Hypertensive heart and chronic kidney disease with heart failure and stage 1 through stage 4 chronic kidney disease, or unspecified chronic kidney disease: Secondary | ICD-10-CM | POA: Diagnosis present

## 2023-01-25 DIAGNOSIS — Z7984 Long term (current) use of oral hypoglycemic drugs: Secondary | ICD-10-CM | POA: Diagnosis not present

## 2023-01-25 DIAGNOSIS — Z953 Presence of xenogenic heart valve: Secondary | ICD-10-CM | POA: Diagnosis not present

## 2023-01-25 DIAGNOSIS — Z951 Presence of aortocoronary bypass graft: Secondary | ICD-10-CM | POA: Diagnosis not present

## 2023-01-25 DIAGNOSIS — I82C12 Acute embolism and thrombosis of left internal jugular vein: Secondary | ICD-10-CM

## 2023-01-25 DIAGNOSIS — E876 Hypokalemia: Secondary | ICD-10-CM | POA: Diagnosis present

## 2023-01-25 DIAGNOSIS — I5042 Chronic combined systolic (congestive) and diastolic (congestive) heart failure: Secondary | ICD-10-CM | POA: Diagnosis present

## 2023-01-25 DIAGNOSIS — Z882 Allergy status to sulfonamides status: Secondary | ICD-10-CM | POA: Diagnosis not present

## 2023-01-25 DIAGNOSIS — E1165 Type 2 diabetes mellitus with hyperglycemia: Secondary | ICD-10-CM | POA: Diagnosis present

## 2023-01-25 DIAGNOSIS — F01A0 Vascular dementia, mild, without behavioral disturbance, psychotic disturbance, mood disturbance, and anxiety: Secondary | ICD-10-CM | POA: Diagnosis present

## 2023-01-25 DIAGNOSIS — Z7982 Long term (current) use of aspirin: Secondary | ICD-10-CM | POA: Diagnosis not present

## 2023-01-25 DIAGNOSIS — I2510 Atherosclerotic heart disease of native coronary artery without angina pectoris: Secondary | ICD-10-CM | POA: Diagnosis present

## 2023-01-25 DIAGNOSIS — E669 Obesity, unspecified: Secondary | ICD-10-CM | POA: Diagnosis present

## 2023-01-25 DIAGNOSIS — Z79899 Other long term (current) drug therapy: Secondary | ICD-10-CM | POA: Diagnosis not present

## 2023-01-25 DIAGNOSIS — I252 Old myocardial infarction: Secondary | ICD-10-CM | POA: Diagnosis not present

## 2023-01-25 DIAGNOSIS — E1122 Type 2 diabetes mellitus with diabetic chronic kidney disease: Secondary | ICD-10-CM | POA: Diagnosis present

## 2023-01-25 DIAGNOSIS — Z8616 Personal history of COVID-19: Secondary | ICD-10-CM | POA: Diagnosis not present

## 2023-01-25 DIAGNOSIS — E785 Hyperlipidemia, unspecified: Secondary | ICD-10-CM | POA: Diagnosis present

## 2023-01-25 LAB — COMPREHENSIVE METABOLIC PANEL
ALT: 23 U/L (ref 0–44)
AST: 19 U/L (ref 15–41)
Albumin: 3.1 g/dL — ABNORMAL LOW (ref 3.5–5.0)
Alkaline Phosphatase: 72 U/L (ref 38–126)
Anion gap: 11 (ref 5–15)
BUN: 29 mg/dL — ABNORMAL HIGH (ref 8–23)
CO2: 22 mmol/L (ref 22–32)
Calcium: 8.9 mg/dL (ref 8.9–10.3)
Chloride: 108 mmol/L (ref 98–111)
Creatinine, Ser: 2.66 mg/dL — ABNORMAL HIGH (ref 0.61–1.24)
GFR, Estimated: 24 mL/min — ABNORMAL LOW (ref >60)
Glucose, Bld: 157 mg/dL — ABNORMAL HIGH (ref 70–99)
Potassium: 3.4 mmol/L — ABNORMAL LOW (ref 3.5–5.1)
Sodium: 141 mmol/L (ref 135–145)
Total Bilirubin: 0.6 mg/dL (ref 0.3–1.2)
Total Protein: 5.5 g/dL — ABNORMAL LOW (ref 6.5–8.1)

## 2023-01-25 LAB — ECHOCARDIOGRAM COMPLETE BUBBLE STUDY
AR max vel: 3.94 cm2
AV Area VTI: 3.73 cm2
AV Area mean vel: 3.72 cm2
AV Mean grad: 6.7 mmHg
AV Peak grad: 12.3 mmHg
Ao pk vel: 1.76 m/s
Area-P 1/2: 2.68 cm2
Height: 73.5 in
S' Lateral: 3.6 cm

## 2023-01-25 LAB — CBC
HCT: 34.8 % — ABNORMAL LOW (ref 39.0–52.0)
Hemoglobin: 11.7 g/dL — ABNORMAL LOW (ref 13.0–17.0)
MCH: 28.8 pg (ref 26.0–34.0)
MCHC: 33.6 g/dL (ref 30.0–36.0)
MCV: 85.7 fL (ref 80.0–100.0)
Platelets: 107 10*3/uL — ABNORMAL LOW (ref 150–400)
RBC: 4.06 MIL/uL — ABNORMAL LOW (ref 4.22–5.81)
RDW: 13.7 % (ref 11.5–15.5)
WBC: 5.5 10*3/uL (ref 4.0–10.5)
nRBC: 0 % (ref 0.0–0.2)

## 2023-01-25 LAB — GLUCOSE, CAPILLARY
Glucose-Capillary: 151 mg/dL — ABNORMAL HIGH (ref 70–99)
Glucose-Capillary: 198 mg/dL — ABNORMAL HIGH (ref 70–99)
Glucose-Capillary: 173 mg/dL — ABNORMAL HIGH (ref 70–99)
Glucose-Capillary: 163 mg/dL — ABNORMAL HIGH (ref 70–99)

## 2023-01-25 LAB — LIPID PANEL
Cholesterol: 90 mg/dL (ref 0–200)
HDL: 23 mg/dL — ABNORMAL LOW (ref >40)
LDL Cholesterol: 29 mg/dL (ref 0–99)
Total CHOL/HDL Ratio: 3.9 ratio
Triglycerides: 188 mg/dL — ABNORMAL HIGH (ref <150)
VLDL: 38 mg/dL (ref 0–40)

## 2023-01-25 LAB — HEMOGLOBIN A1C
Hgb A1c MFr Bld: 8.8 % — ABNORMAL HIGH (ref 4.8–5.6)
Mean Plasma Glucose: 205.86 mg/dL

## 2023-01-25 MED ORDER — ATORVASTATIN CALCIUM 80 MG PO TABS
80.0000 mg | ORAL_TABLET | Freq: Every day | ORAL | Status: DC
  Administered 2023-01-25 – 2023-01-27 (×3): 80 mg via ORAL
  Filled 2023-01-25 (×3): qty 1

## 2023-01-25 MED ORDER — POTASSIUM CHLORIDE CRYS ER 20 MEQ PO TBCR
40.0000 meq | EXTENDED_RELEASE_TABLET | Freq: Once | ORAL | Status: AC
  Administered 2023-01-25: 40 meq via ORAL
  Filled 2023-01-25: qty 2

## 2023-01-25 MED ORDER — APIXABAN 5 MG PO TABS: 10.0000 mg | ORAL_TABLET | Freq: Two times a day (BID) | ORAL | Status: DC

## 2023-01-25 MED ORDER — ASPIRIN 81 MG PO TBEC
81.0000 mg | DELAYED_RELEASE_TABLET | Freq: Every day | ORAL | Status: DC
  Administered 2023-01-25 – 2023-01-27 (×3): 81 mg via ORAL
  Filled 2023-01-25 (×3): qty 1

## 2023-01-25 MED ORDER — APIXABAN 5 MG PO TABS
10.0000 mg | ORAL_TABLET | Freq: Two times a day (BID) | ORAL | Status: DC
  Administered 2023-01-25 – 2023-01-27 (×4): 10 mg via ORAL
  Filled 2023-01-25 (×4): qty 2

## 2023-01-25 MED ORDER — APIXABAN 5 MG PO TABS: 5.0000 mg | ORAL_TABLET | Freq: Two times a day (BID) | ORAL | Status: DC

## 2023-01-25 NOTE — Plan of Care (Signed)
  Problem: Education: Goal: Knowledge of disease or condition will improve 01/25/2023 0325 by Nicole Cella, RN Outcome: Progressing 01/25/2023 0324 by Nicole Cella, RN Outcome: Progressing Goal: Knowledge of secondary prevention will improve (MUST DOCUMENT ALL) 01/25/2023 0325 by Nicole Cella, RN Outcome: Progressing 01/25/2023 0324 by Nicole Cella, RN Outcome: Progressing Goal: Knowledge of patient specific risk factors will improve Loraine Leriche N/A or DELETE if not current risk factor) 01/25/2023 0325 by Nicole Cella, RN Outcome: Progressing 01/25/2023 0324 by Nicole Cella, RN Outcome: Progressing   Problem: Ischemic Stroke/TIA Tissue Perfusion: Goal: Complications of ischemic stroke/TIA will be minimized Outcome: Progressing   Problem: Coping: Goal: Will verbalize positive feelings about self Outcome: Progressing Goal: Will identify appropriate support needs Outcome: Progressing   Problem: Health Behavior/Discharge Planning: Goal: Ability to manage health-related needs will improve Outcome: Progressing Goal: Goals will be collaboratively established with patient/family Outcome: Progressing   Problem: Nutrition: Goal: Risk of aspiration will decrease Outcome: Progressing Goal: Dietary intake will improve Outcome: Progressing

## 2023-01-25 NOTE — Progress Notes (Addendum)
ANTICOAGULATION CONSULT NOTE - Initial Consult  Pharmacy Consult for Eliquis Indication: DVT  Allergies  Allergen Reactions   Altace [Ramipril] Cough   Shellfish Allergy Nausea And Vomiting   Sulfa Antibiotics Hives    Patient Measurements: Height: 6' 1.5" (186.7 cm) IBW/kg (Calculated) : 81.05   Vital Signs: Temp: 97.9 F (36.6 C) (08/17 1546) Temp Source: Oral (08/17 1546) BP: 126/81 (08/17 1546) Pulse Rate: 62 (08/17 1546)  Labs: Recent Labs    01/24/23 1040 01/24/23 1055 01/25/23 0301  HGB 12.2* 11.6* 11.7*  HCT 36.2* 34.0* 34.8*  PLT 121*  --  107*  APTT 28  --   --   LABPROT 13.8  --   --   INR 1.0  --   --   CREATININE 3.25* 3.40* 2.66*    Estimated Creatinine Clearance: 31.3 mL/min (A) (by C-G formula based on SCr of 2.66 mg/dL (H)).   Medical History: Past Medical History:  Diagnosis Date   Acute on chronic combined systolic and diastolic CHF (congestive heart failure) (HCC) 11/24/2008   Qualifier: Diagnosis of   By: Johnnette Barrios, CNA, Sandy         Acute renal failure with acute tubular necrosis superimposed on stage 3b chronic kidney disease (HCC) 12/15/2021   Allergy    Aortic stenosis    Arthritis    hands   BPH (benign prostatic hyperplasia)    CHF (congestive heart failure) (HCC)    CKD (chronic kidney disease)    COVID-19    COVID-19 virus infection 12/16/2021   Diabetes mellitus    Dyspnea    DYSPNEA 11/24/2008   Qualifier: Diagnosis of   By: Johnnette Barrios, CNA, Sandy         Hyperlipidemia    Hypertension    Hypertensive urgency 12/10/2021   Myocardial infarction [facility]) 06/10/2002   NSTEMI (non-ST elevated myocardial infarction) (HCC) 12/10/2021   Stroke (HCC) 09/2020   Some memory deficits   Vertigo    Wears dentures    full upper    Medications:  Medications Prior to Admission  Medication Sig Dispense Refill Last Dose   amiodarone (PACERONE) 200 MG tablet Take 1 tablet by mouth daily.   01/24/2023   amLODipine (NORVASC) 2.5 MG tablet  Take 2.5 mg by mouth daily.   01/24/2023   apixaban (ELIQUIS) 5 MG TABS tablet Take by mouth.   Past Week   aspirin EC 81 MG tablet Take 81 mg by mouth daily.   01/24/2023   atorvastatin (LIPITOR) 80 MG tablet Take 80 mg by mouth daily.   01/24/2023   empagliflozin (JARDIANCE) 10 MG TABS tablet Take 1 tablet by mouth daily.   01/24/2023   ENTRESTO 49-51 MG Take 1 tablet by mouth 2 (two) times daily.   01/24/2023   ezetimibe (ZETIA) 10 MG tablet Take 10 mg by mouth daily.   01/24/2023   finasteride (PROSCAR) 5 MG tablet Take 5 mg by mouth daily.   01/24/2023   ibuprofen (ADVIL) 200 MG tablet Take 400 mg by mouth 2 (two) times daily as needed for headache or moderate pain.   Past Week   Insulin NPH Human, Isophane, (HUMULIN N PEN [place]) Inject 12 Units into the skin daily with breakfast.   01/24/2023   metoprolol succinate (TOPROL-XL) 50 MG 24 hr tablet Take 50 mg by mouth daily.   01/24/2023   nitroGLYCERIN (NITROSTAT) 0.4 MG SL tablet Place under the tongue.   UNK   Semaglutide, 1 MG/DOSE, (OZEMPIC, 1 MG/DOSE,) 4 MG/3ML SOPN  Inject 1 mg into the skin once a week.   Past Week   tamsulosin (FLOMAX) 0.4 MG CAPS capsule Take 0.4 mg by mouth daily.   Past Month   Scheduled:   amiodarone  200 mg Oral Daily   apixaban  5 mg Oral BID   aspirin EC  81 mg Oral Daily   atorvastatin  80 mg Oral Daily   ezetimibe  10 mg Oral Daily   finasteride  5 mg Oral Daily   insulin aspart  0-6 Units Subcutaneous TID WC   insulin detemir  10 Units Subcutaneous QHS   metoprolol succinate  50 mg Oral Daily   Infusions:   Assessment: Patient found to have acute DVT 01/25/23. Acute CVA this admission. Taking Eliquis PTA for history of DVT, however on hold for several days (exact time unknown) due to bleeding after dental procedure last week. Not considered Eliquis failure since several days were missed. Pharmacy consulted to resume Eliquis with load. Patient has had two doses of Eliquis 5 mg inpatient thus far  CBC stable. No  bleeding noted. No bleeding on CT today.  Plan:  Start Eliquis 10 mg BID x 7 days, then 5 mg BID Monitor for bleeding  Marygrace Drought 01/25/2023,5:08 PM

## 2023-01-25 NOTE — Progress Notes (Signed)
*  PRELIMINARY RESULTS* Echocardiogram 2D Echocardiogram has been performed.  Shane Holloway 01/25/2023, 5:14 PM

## 2023-01-25 NOTE — Progress Notes (Signed)
SLP Cancellation Note  Patient Details Name: Shane Holloway MRN: [id number] DOB: 06-29-1950   Cancelled treatment:       Reason Eval/Treat Not Completed: Patient at procedure or test/unavailable. Pt being taken down to vascular lab. Will continue attempts to f/u for evaluation.    Gwynneth Aliment, M.A., CF-SLP Speech Language Pathology, Acute Rehabilitation Services  Secure Chat preferred [phone]  01/25/2023, 3:40 PM

## 2023-01-25 NOTE — Progress Notes (Addendum)
VASCULAR LAB    Carotid duplex has been performed.  See CV proc for preliminary results.  Messaged incidental findings to Dr. [place] and to the stroke team via secure chat.   Tayley Mudrick, RVT 01/25/2023, 4:23 PM

## 2023-01-25 NOTE — Progress Notes (Signed)
VASCULAR LAB    TCD bubble study has been performed.  See CV proc for preliminary results.   Anna Livers, RVT 01/25/2023, 5:01 PM

## 2023-01-25 NOTE — Progress Notes (Addendum)
STROKE TEAM PROGRESS NOTE   BRIEF HPI [name] is a 72 y.o. male with history of dementia, CHF, chronic renal failure, bioprosthetic aortic valve, DVT on Eliquis paused for recent dental procedure, BPH, diabetes, hypertension, hyperlipidemia, non-STEMI and 3 strokes who presented 8/16 with right leg weakness and numbness which he first noticed when he woke up this morning at 9:30 AM.  His last known normal time was 8:30 PM when he went to sleep last night.  He stated that about a week ago he had a cold and then fell and hit his head but did not obtain medical help for this.  He normally takes Eliquis, but this medication has been on hold due to bleeding after a dental procedure this week.  Patient is unsure how long Eliquis has been on hold but stated it has been at least a few days.  LKW 2030 8/15, NIH 4, mRS 2, outside of window for TNK, No thrombectomy due to completed stroke on MRI.   SIGNIFICANT HOSPITAL EVENTS 8/16: admitted for stroke workup due to small Acute Left Periorolandic Cortex Infarct seen on MRI  INTERIM HISTORY/SUBJECTIVE Stroke workup continuing.   On exam, not oriented to month or year. RN at bedside did state that he was able to correctly tell her earlier in the day.    OBJECTIVE  CBC    Component Value Date/Time   WBC 5.5 01/25/2023 0301   RBC 4.06 (L) 01/25/2023 0301   HGB 11.7 (L) 01/25/2023 0301   HCT 34.8 (L) 01/25/2023 0301   PLT 107 (L) 01/25/2023 0301   MCV 85.7 01/25/2023 0301   MCV 89.1 10/21/2012 1405   MCH 28.8 01/25/2023 0301   MCHC 33.6 01/25/2023 0301   RDW 13.7 01/25/2023 0301   LYMPHSABS 1.1 01/24/2023 1040   MONOABS 0.5 01/24/2023 1040   EOSABS 0.2 01/24/2023 1040   BASOSABS 0.1 01/24/2023 1040    BMET    Component Value Date/Time   NA 141 01/25/2023 0301   K 3.4 (L) 01/25/2023 0301   CL 108 01/25/2023 0301   CO2 22 01/25/2023 0301   GLUCOSE 157 (H) 01/25/2023 0301   BUN 29 (H) 01/25/2023 0301   CREATININE 2.66 (H)  01/25/2023 0301   CREATININE 0.79 09/23/2011 1201   CALCIUM 8.9 01/25/2023 0301   GFRNONAA 24 (L) 01/25/2023 0301    IMAGING past 24 hours MR ANGIO HEAD WO CONTRAST  Result Date: 01/24/2023 CLINICAL DATA:  Follow-up examination for stroke. EXAM: MRA HEAD WITHOUT CONTRAST TECHNIQUE: Angiographic images of the Circle of Willis were acquired using MRA technique without intravenous contrast. COMPARISON:  Prior MRI from earlier the same day. FINDINGS: Anterior circulation: Both internal carotid arteries are patent through the siphons without stenosis or other abnormality. Right A1 segment dominant and widely patent. Left A1 is hypoplastic and grossly patent on time-of-flight sequence. Tiny 2 mm outpouching extending from the over surface of the anterior communicating artery complex favored to reflect a small vascular infundibulum related to a hypoplastic left A1 segment. Anterior communicating artery complex otherwise unremarkable. Both ACAs patent without proximal stenosis. No M1 stenosis or occlusion. No proximal MCA branch occlusion or high-grade stenosis. Distal MCA branches perfused and symmetric. Posterior circulation: Left vertebral artery strongly dominant and widely patent. Left PICA patent. Visualized right vertebral artery is hypoplastic and largely terminates in PICA. Right PICA patent as well. Basilar widely patent without stenosis. Superior cerebral arteries patent bilaterally. Both PCAs primarily supplied via the basilar. PCAs patent distal aspects without  proximal stenosis. Anatomic variants: Hypoplastic right vertebral artery terminates in PICA. Hypoplastic left A1 segment. Other: No intracranial aneurysm. IMPRESSION: 1. Negative intracranial MRA for large vessel occlusion or other emergent finding. No hemodynamically significant or correctable stenosis. 2. 2 mm outpouching extending inferiorly from the anterior communicating artery complex, favored to reflect a small vascular infundibulum  related to a hypoplastic left A1 segment. Small aneurysm disc felt to exclude. Attention at follow-up recommended. Electronically Signed   By: Rise Mu M.D.   On: 01/24/2023 19:42   MR BRAIN WO CONTRAST  Result Date: 01/24/2023 CLINICAL DATA:  Neuro deficit with acute stroke suspected EXAM: MRI HEAD WITHOUT CONTRAST TECHNIQUE: Multiplanar, multiecho pulse sequences of the brain and surrounding structures were obtained without intravenous contrast. COMPARISON:  Head CT from earlier today FINDINGS: Brain: Small acute infarct in the high and left perirolandic cortex. Diffusion alteration along the superior left frontal cortex attributed to susceptibility artifact when correlated with gradient imaging. Advanced chronic small vessel ischemic gliosis in the cerebral white matter with chronic lacunar infarcts in the deep gray nuclei and deep white matter bilaterally. Chronic cortically based infarcts along the left more than right occipital cortex and in the inferior left temporal occipital cortex- site highlighted on prior head CT. Small chronic bilateral cerebellar infarcts. Brain volume is normal for age. 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 No acute hemorrhage, hydrocephalus, or collection. Vascular: Normal flow voids. Skull and upper cervical spine: Normal marrow signal. Sinuses/Orbits: No acute finding IMPRESSION: 1. Small acute infarct in the superior left perirolandic cortex. 2. Widespread chronic ischemic injury as described. Electronically Signed   By: Tiburcio Pea M.D.   On: 01/24/2023 14:20   CT HEAD WO CONTRAST  Result Date: 01/24/2023 CLINICAL DATA:  Right lower extremity weakness/numbness. EXAM: CT HEAD WITHOUT CONTRAST TECHNIQUE: Contiguous axial images were obtained from the base of the skull through the vertex without intravenous contrast. RADIATION DOSE REDUCTION: This exam was performed according to the  departmental dose-optimization program which includes automated exposure control, adjustment of the mA and/or kV according to patient size and/or use of iterative reconstruction technique. COMPARISON:  CT head 06/06/2022 FINDINGS: Brain: There is no evidence of acute intracranial hemorrhage or extra-axial fluid collection. There is hypodensity with loss of gray-white differentiation along the undersurface of the left temporal occipital region which is new since the prior study (6-49, 5-44). Finding is consistent with an interval infarct, possibly subacute Numerous additional remote infarcts in the bilateral occipital lobes, right frontal cortex, hemispheric white matter, bilateral cerebellar hemispheres, and left basal ganglia are unchanged. Parenchymal volume is stable. The ventricles are stable in size. The pituitary and suprasellar region are normal. There is no mass lesion. There is no mass effect or midline shift. Vascular: There is calcification of the bilateral carotid siphons and vertebral arteries. Skull: Normal. Negative for fracture or focal lesion. Sinuses/Orbits: The paranasal sinuses are clear. Bilateral lens implants are in place. The globes and orbits are otherwise unremarkable. Other: The mastoid air cells and middle ear cavities are clear. IMPRESSION: 1. New hypodensity with loss of gray-white differentiation along the undersurface of the left temporal occipital region consistent with interval infarct, possibly subacute. Consider brain MRI for characterization. 2. No acute intracranial hemorrhage. Electronically Signed   By: Lesia Hausen M.D.   On: 01/24/2023 11:42    Vitals:   01/24/23 2003 01/25/23 0020 01/25/23 0444 01/25/23 0809  BP: (!) 155/83  130/74 98/74  Pulse: 61  66 (!) 103  Resp: 15  18 15   Temp: 97.7 F (36.5 C)  97.7 F (36.5 C) 98.1 F (36.7 C)  TempSrc: Oral  Oral Oral  SpO2: 98%  99% 97%  Height:  6' 1.5" (1.867 m)      PHYSICAL EXAM General:  Alert,  well-nourished, well-developed patient in no acute distress Psych:  Mood and affect appropriate for situation CV: Regular rate and rhythm on monitor Respiratory:  Regular, unlabored respirations on room air GI: Abdomen soft and nontender   NEURO:  Mental Status: AA&O to person, place, situation. Confused to month and year, patient is able to give clear and coherent history Speech/Language: speech is without dysarthria or aphasia.  Naming, repetition, fluency, and comprehension intact.  Cranial Nerves:  II: PERRL. Visual fields full.  III, IV, VI: EOMI. Eyelids elevate symmetrically.  V: Sensation is intact to light touch and symmetrical to face.  VII: Face is symmetrical resting and smiling VIII: hearing intact to voice. IX, X: Palate elevates symmetrically. Phonation is normal.  WU:[id number] shrug 5/5. XII: tongue is midline without fasciculations. Motor: RLE 3/5 proximal, 0/5 distal, unable to wiggle toes Tone: is normal and bulk is normal Sensation- Diminished to light touch throughout RLE Coordination: FTN intact bilaterally, HKS: no ataxia in BLE.No drift.  Gait- deferred   ASSESSMENT/Holloway  Acute Small Left ACA infarct, etiology:  recent temporary stoppage of Eliquis for dental bleeding post procedure  Code Stroke CT head: New hypodensity with loss of gray-white differentiation along the undersurface of the left temporal occipital region  WGN:FAOZH acute infarct in the superior left perirolandic cortex  MRA: Negative for LVO. 2mm outpouching favored to reflect small vascular infudibulum. Carotid Doppler unremarkable 2D Echo EF 55-60% LDL 29 HgbA1c 8.8 UDS neg VTE prophylaxis - eliquis ASA 81 prior to admission (eliquis on hold for oral bleeding post dental procedure), now resumed Eliquis (apixaban) daily and ASA 81.  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 incidental finding of left internal jugular and subclavian V DVT, treated with  [facility] and repeat testing in 02/2021 showed resolution  Current CUS showed again incidental finding of acute DVT at left internal jugular  TCD bubble study no PFO Now on eliquis treatment dosing  Will do MRV to rule out CVST  Hypertension Combined CHF Aortic Stenosis Home meds:  Amiodarone 200mg , Toprol-XL 50mg , Entresto, amlodipine 2.5mg  BP stable Long term BP goal normotensive  Hyperlipidemia Home meds:  Lipitor 80mg , Zetia 10mg , resumed in hospital LDL 29, goal < 70 Continue statin at discharge  Diabetes type II Uncontrolled Home meds:  Insulin NPH, Ozempic, Jardiance HgbA1c 8.8, goal < 7.0 CBGs SSI Recommend close follow-up with PCP for better DM control  Other Stroke Risk Factors Obesity, Body mass index is 30.19 kg/m., BMI >/= 30 associated with increased stroke risk, recommend weight loss, diet and exercise as appropriate  Former Smoker  Hospital day # 0   Pt seen by Neuro NP/APP and later by MD. Note/Holloway to be edited by MD as needed.    Shane January, DNP, AGACNP-BC [facility] Please use AMION for contact information & EPIC for messaging.  ATTENDING NOTE: I reviewed above note and agree with the assessment and Holloway. Pt was seen and examined.   No family at the bedside. Pt not orientated to time but OK with place and age. RLE 3-/5 proximal and 0/5 distally. RLE diminished light touch sensation. MRI showed left ACA infarct consistent with his symptoms. CUS  showed incidental left internal jugular DVT. Pt had left internal jugular and subclavian vein DVT in 11/2020 and treated with [facility] and the thrombosis was resolved in 02/2021 on repeat testing. Now acute left internal jugular DVT seems to be recurrent. Discussed with Dr. [place], will start treatment dose of eliquis given his small ACA strokes. TCD bubble study negative for PFO. Will do MRV to rule out CSVT. Continue statin and zetia. PT and OT recommend SNF. Will follow.   For detailed assessment and Holloway,  please refer to above/below as I have made changes wherever appropriate.   Shane Plan, MD PhD Stroke Neurology 01/25/2023 6:15 PM  I discussed with Dr. [place]. Pt found to have stroke and DVT. I spent  extensive face-to-face time with the patient, more than 50% of which was spent in counseling and coordination of care, reviewing test results, images and medication, and discussing the diagnosis, treatment Holloway and potential prognosis. This patient's care requiresreview of multiple databases, neurological assessment, discussion with family, other specialists and medical decision making of high complexity.      To contact Stroke Continuity provider, please refer to [email or web address]. After hours, contact General Neurology

## 2023-01-25 NOTE — Plan of Care (Signed)
  Problem: Education: Goal: Knowledge of disease or condition will improve Outcome: Progressing Goal: Knowledge of secondary prevention will improve (MUST DOCUMENT ALL) Outcome: Progressing Goal: Knowledge of patient specific risk factors will improve (Mark N/A or DELETE if not current risk factor) Outcome: Progressing   Problem: Ischemic Stroke/TIA Tissue Perfusion: Goal: Complications of ischemic stroke/TIA will be minimized Outcome: Progressing   Problem: Coping: Goal: Will verbalize positive feelings about self Outcome: Progressing Goal: Will identify appropriate support needs Outcome: Progressing   Problem: Health Behavior/Discharge Planning: Goal: Ability to manage health-related needs will improve Outcome: Progressing Goal: Goals will be collaboratively established with patient/family Outcome: Progressing   Problem: Nutrition: Goal: Risk of aspiration will decrease Outcome: Progressing Goal: Dietary intake will improve Outcome: Progressing   

## 2023-01-25 NOTE — Progress Notes (Addendum)
PROGRESS NOTE    Shane Holloway  WUJ:[id number] DOB: [birth year] DOA: 01/24/2023 PCP: Kurtis Bushman, MD    Brief Narrative:   Shane Holloway is a 72 y.o. male with past medical history significant for HTN, HLD, chronic systolic/diastolic congestive heart failure, CKD stage IV, CAD s/p CABG, DM2, history of SVT, history of DVT on Eliquis, history of CVA, history of aortic valve repair, obesity who presented to [facility] ED on 8/16 with complaints of right lower extremity weakness, numbness in which she noted at 9:30 AM on 8/16.  Last known normal 8:30 PM on 8/15.  Patient reports takes Eliquis, but has been on hold due to bleeding after dental procedure earlier in the week.  Unsure how many days he has been holding this but has been [place] a few days.  Denies fever/chills, no chest pain, no shortness of breath, abdominal pain, no nausea/vomiting/diarrhea.  In the ED, temperature 98.6 F, HR 65, RR 16, BP 130/83, SpO2 95% on room air.  WBC 5.4, hemoglobin 12.2, platelets 121.  Sodium 144, potassium 4.3, chloride 107, CO2 23, glucose 191, BUN 33, creatinine 3.25.  AST 20, ALT 28, total bilirubin 0.6.  INR 1.0.  Urinalysis small leukocytes, negative nitrite, rare bacteria, greater than 50 WBCs.  UDS negative.  EtOH level less than 10.  CT head without contrast with new hypodensity loss of gray/white differentiation along the undersurface of the left temporal occipital region consistent with interval infarct, no hemorrhage.  MRI brain without contrast with small acute infarct superior left perirolandic cortex, widespread chronic ischemic injury.  MRA head negative for large vessel occlusion.  Neurology was consulted.  TRH consulted for admission for further evaluation management of acute CVA.  Assessment & Plan:   Acute CVA Patient presenting to ED with right lower extremity weakness associated with paresthesias.  Last known normal 8:30 PM on 8/15.  CT head without contrast with  abnormality left temporal/occipital region, no hemorrhage.  MRI brain with acute infarct superior left perirolandic cortex.  MRA head negative for large vessel occlusion.  Hemoglobin A1c 8.8, LDL 29. -- Neurology following, appreciate assistance -- Continue to hold antihypertensives with goal systolic <220 and diastolic <120 -- TTE: Pending -- Carotid ultrasound: Pending -- PT/OT/SLP evaluation: Pending -- Aspirin 81 mg p.o. daily -- Atorvastatin 80 mg p.o. daily -- Eliquis 5 mg p.o. twice daily -- Continue monitor on telemetry  CKD stage IV Baseline creatinine 2.8-2.9; creatinine on admission 3.25, stable -- Cr 3.25>3.40>2.66 -- Avoid nephrotoxins, renally dose all medications -- BMP daily  Hypokalemia Potassium 3.4, will replete. -- Repeat electrolytes in a.m. to include magnesium  Essential hypertension Chronic systolic/diastolic congestive heart failure Hx SVT Most recent TEE 2023 with LVEF 35-40%, grade 1 diastolic dysfunction.  Home regimen includes amlodipine 2.5 mg p.o. daily, Entresto 49-51 mg p.o. twice daily, metoprolol succinate 50 mg p.o. daily, amiodarone 200 mg p.o. daily. --Repeat TTE: Pending -- Metoprolol succinate 50 mg p.o. daily -- Amiodarone 20 mg p.o. daily -- Hold home Entresto, amlodipine to allow permissive hypertension as above -- Monitor on telemetry -- Strict I's and O's and daily weights  Hx aortic valve replacement Aortic valve replacement 2015.  HLD Lipid panel with total cholesterol 90, HDL 23, LDL 29, triglycerides 188. -- Zetia 10 mg p.o. daily -- Atorvastatin 80 mg p.o. daily  Hx DVT Started on Eliquis June 2022 for L IJ and subclavian DVT.  Seen by hematology 03/2021 and given unprovoked VTE and concern for embolic  strokes, favor long-term anticoagulation. --Continue Eliquis 5 mg p.o. twice daily  Type 2 diabetes mellitus, with hyperglycemia Hemoglobin A1c 8.8, not optimally controlled.  Home regimen includes NPH 12 units [place] daily with  breakfast, Jardiance, semaglutide. -- Levemir 10 units Bogard daily -- SSI for coverage -- CBGs qAC/HS  BPH -- Finasteride 5 mg p.o. daily  Dementia, mild --Delirium precautions --Get up during the day --Encourage a familiar face to remain present throughout the day --Keep blinds open and lights on during daylight hours --Minimize the use of opioids/benzodiazepines  Obesity Complicates all facets of care   DVT prophylaxis:  apixaban (ELIQUIS) tablet 5 mg    Code Status: Full Code Family Communication: No family present at bedside this morning  Disposition Plan:  Level of care: Telemetry Medical Status is: Observation The patient remains OBS appropriate and will d/c before 2 midnights.    Consultants:  Neurology  Procedures:  TTE: Pending Carotid ultrasound: Pending  Antimicrobials:  None   Subjective: Patient seen examined bedside, resting comfortably.  Lying in bed.  Continues with right lower extremity weakness and numbness to his anterior shin.  No significant change since ED presentation.  No family present at bedside this morning.  States "I have been told that I need to go to a nursing home".  No other specific questions or concerns at this time.  Denies headache, no dizziness, no visual changes, no chest pain, no palpitations, no shortness of breath, no abdominal pain, no fever/chills/night sweats, no nausea/vomiting/diarrhea, no fatigue.  No acute events overnight per nurse staff.  Objective: Vitals:   01/25/23 0020 01/25/23 0444 01/25/23 0809 01/25/23 0818  BP:  130/74 98/74   Pulse:  66 (!) 103 99  Resp:  18 15 14   Temp:  97.7 F (36.5 C) 98.1 F (36.7 C)   TempSrc:  Oral Oral   SpO2:  99% 97%   Height: 6' 1.5" (1.867 m)       Intake/Output Summary (Last 24 hours) at 01/25/2023 1006 Last data filed at 01/25/2023 0500 Gross per 24 hour  Intake 782.9 ml  Output 1300 ml  Net -517.1 ml   There were no vitals filed for this  visit.  Examination:  Physical Exam: GEN: NAD, alert and oriented to person/place (hospital/[place]), but not time (no year given), or person (President" no answer given), chronically ill in appearance, appears older than stated age HEENT: NCAT, PERRL, EOMI, sclera clear, poor dentition, dry mucous membranes PULM: CTAB w/o wheezes/crackles, normal respiratory effort, on room air CV: RRR w/o M/G/R GI: abd soft, NTND, NABS, no R/G/M MSK: no peripheral edema, muscle strength decreased 4/5 right lower extremity  PSYCH: normal mood/affect Integumentary: dry/intact, no rashes or wounds  Neuro Exam Mental Status: A&O to self/place but not time or person, no dysarthria, no aphasia  Cranial Nerves: visual fields full, PERRL, EOMi, intact smooth pursuit, no nystagmus, no ptosis, facial sensation intact bilaterally, 5/5 jaw strength, nasolabial fold & smile symetric,  eyebrow  raise & 5/5 eye closure symetric, hearing symmetric and normal to rubbing fingers, palate elevates symmetrically, head turning and shoulder shrug intact and symetric bilaterally, tongue protrusion is midline Motor: no pronator drift, LUE 5/5,   LLE 5/5,   RUE 5/5,   RLE 4/5   Sensory: Sensation decreased to light touch anterior right shin Coordination/Movement: no tremor noted, no dysmetria   Data Reviewed: I have personally reviewed following labs and imaging studies  CBC: Recent Labs  Lab 01/19/23 0644 01/24/23 1040 01/24/23 1055  01/25/23 0301  WBC 7.4 5.4  --  5.5  NEUTROABS 5.6 3.6  --   --   HGB 12.7* 12.2* 11.6* 11.7*  HCT 39.3 36.2* 34.0* 34.8*  MCV 87.7 89.6  --  85.7  PLT 141* 121*  --  107*   Basic Metabolic Panel: Recent Labs  Lab 01/19/23 0644 01/24/23 1040 01/24/23 1055 01/25/23 0301  NA 138 144 143 141  K 4.2 4.3 4.2 3.4*  CL 104 107 108 108  CO2 22 23  --  22  GLUCOSE 242* 191* 180* 157*  BUN 50* 33* 31* 29*  CREATININE 3.23* 3.25* 3.40* 2.66*  CALCIUM 9.1 9.2  --  8.9   GFR: Estimated  Creatinine Clearance: 31.3 mL/min (A) (by C-G formula based on SCr of 2.66 mg/dL (H)). Liver Function Tests: Recent Labs  Lab 01/19/23 0644 01/24/23 1040 01/25/23 0301  AST 15 20 19   ALT 19 28 23   ALKPHOS 81 83 72  BILITOT 0.6 0.6 0.6  PROT 6.1* 5.7* 5.5*  ALBUMIN 3.4* 3.4* 3.1*   No results for input(s): "LIPASE", "AMYLASE" in the last 168 hours. No results for input(s): "AMMONIA" in the last 168 hours. Coagulation Profile: Recent Labs  Lab 01/19/23 0644 01/24/23 1040  INR 1.3* 1.0   Cardiac Enzymes: No results for input(s): "CKTOTAL", "CKMB", "CKMBINDEX", "TROPONINI" in the last 168 hours. BNP (last 3 results) No results for input(s): "PROBNP" in the last 8760 hours. HbA1C: Recent Labs    01/25/23 0301  HGBA1C 8.8*   CBG: Recent Labs  Lab 01/24/23 2148 01/25/23 0608  GLUCAP 215* 151*   Lipid Profile: Recent Labs    01/25/23 0301  CHOL 90  HDL 23*  LDLCALC 29  TRIG 409*  CHOLHDL 3.9   Thyroid Function Tests: No results for input(s): "TSH", "T4TOTAL", "FREET4", "T3FREE", "THYROIDAB" in the last 72 hours. Anemia Panel: No results for input(s): "VITAMINB12", "FOLATE", "FERRITIN", "TIBC", "IRON", "RETICCTPCT" in the last 72 hours. Sepsis Labs: No results for input(s): "PROCALCITON", "LATICACIDVEN" in the last 168 hours.  No results found for this or any previous visit (from the past 240 hour(s)).       Radiology Studies: MR ANGIO HEAD WO CONTRAST  Result Date: 01/24/2023 CLINICAL DATA:  Follow-up examination for stroke. EXAM: MRA HEAD WITHOUT CONTRAST TECHNIQUE: Angiographic images of the Circle of Willis were acquired using MRA technique without intravenous contrast. COMPARISON:  Prior MRI from earlier the same day. FINDINGS: Anterior circulation: Both internal carotid arteries are patent through the siphons without stenosis or other abnormality. Right A1 segment dominant and widely patent. Left A1 is hypoplastic and grossly patent on time-of-flight  sequence. Tiny 2 mm outpouching extending from the over surface of the anterior communicating artery complex favored to reflect a small vascular infundibulum related to a hypoplastic left A1 segment. Anterior communicating artery complex otherwise unremarkable. Both ACAs patent without proximal stenosis. No M1 stenosis or occlusion. No proximal MCA branch occlusion or high-grade stenosis. Distal MCA branches perfused and symmetric. Posterior circulation: Left vertebral artery strongly dominant and widely patent. Left PICA patent. Visualized right vertebral artery is hypoplastic and largely terminates in PICA. Right PICA patent as well. Basilar widely patent without stenosis. Superior cerebral arteries patent bilaterally. Both PCAs primarily supplied via the basilar. PCAs patent distal aspects without proximal stenosis. Anatomic variants: Hypoplastic right vertebral artery terminates in PICA. Hypoplastic left A1 segment. Other: No intracranial aneurysm. IMPRESSION: 1. Negative intracranial MRA for large vessel occlusion or other emergent finding. No hemodynamically significant or  correctable stenosis. 2. 2 mm outpouching extending inferiorly from the anterior communicating artery complex, favored to reflect a small vascular infundibulum related to a hypoplastic left A1 segment. Small aneurysm disc felt to exclude. Attention at follow-up recommended. Electronically Signed   By: Rise Mu M.D.   On: 01/24/2023 19:42   MR BRAIN WO CONTRAST  Result Date: 01/24/2023 CLINICAL DATA:  Neuro deficit with acute stroke suspected EXAM: MRI HEAD WITHOUT CONTRAST TECHNIQUE: Multiplanar, multiecho pulse sequences of the brain and surrounding structures were obtained without intravenous contrast. COMPARISON:  Head CT from earlier today FINDINGS: Brain: Small acute infarct in the high and left perirolandic cortex. Diffusion alteration along the superior left frontal cortex attributed to susceptibility artifact when  correlated with gradient imaging. Advanced chronic small vessel ischemic gliosis in the cerebral white matter with chronic lacunar infarcts in the deep gray nuclei and deep white matter bilaterally. Chronic cortically based infarcts along the left more than right occipital cortex and in the inferior left temporal occipital cortex- site highlighted on prior head CT. Small chronic bilateral cerebellar infarcts. Brain volume is normal for age. 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 No acute hemorrhage, hydrocephalus, or collection. Vascular: Normal flow voids. Skull and upper cervical spine: Normal marrow signal. Sinuses/Orbits: No acute finding IMPRESSION: 1. Small acute infarct in the superior left perirolandic cortex. 2. Widespread chronic ischemic injury as described. Electronically Signed   By: Tiburcio Pea M.D.   On: 01/24/2023 14:20   CT HEAD WO CONTRAST  Result Date: 01/24/2023 CLINICAL DATA:  Right lower extremity weakness/numbness. EXAM: CT HEAD WITHOUT CONTRAST TECHNIQUE: Contiguous axial images were obtained from the base of the skull through the vertex without intravenous contrast. RADIATION DOSE REDUCTION: This exam was performed according to the departmental dose-optimization program which includes automated exposure control, adjustment of the mA and/or kV according to patient size and/or use of iterative reconstruction technique. COMPARISON:  CT head 06/06/2022 FINDINGS: Brain: There is no evidence of acute intracranial hemorrhage or extra-axial fluid collection. There is hypodensity with loss of gray-white differentiation along the undersurface of the left temporal occipital region which is new since the prior study (6-49, 5-44). Finding is consistent with an interval infarct, possibly subacute Numerous additional remote infarcts in the bilateral occipital lobes, right frontal cortex, hemispheric white matter, bilateral  cerebellar hemispheres, and left basal ganglia are unchanged. Parenchymal volume is stable. The ventricles are stable in size. The pituitary and suprasellar region are normal. There is no mass lesion. There is no mass effect or midline shift. Vascular: There is calcification of the bilateral carotid siphons and vertebral arteries. Skull: Normal. Negative for fracture or focal lesion. Sinuses/Orbits: The paranasal sinuses are clear. Bilateral lens implants are in place. The globes and orbits are otherwise unremarkable. Other: The mastoid air cells and middle ear cavities are clear. IMPRESSION: 1. New hypodensity with loss of gray-white differentiation along the undersurface of the left temporal occipital region consistent with interval infarct, possibly subacute. Consider brain MRI for characterization. 2. No acute intracranial hemorrhage. Electronically Signed   By: Lesia Hausen M.D.   On: 01/24/2023 11:42        Scheduled Meds:   stroke: early stages of recovery book   Does not apply Once   amiodarone  200 mg Oral Daily   apixaban  5 mg Oral BID   aspirin EC  81 mg Oral Daily   atorvastatin  80 mg Oral Daily   ezetimibe  10 mg Oral Daily   finasteride  5 mg Oral Daily   insulin aspart  0-6 Units Subcutaneous TID WC   insulin detemir  10 Units Subcutaneous QHS   metoprolol succinate  50 mg Oral Daily   Continuous Infusions:   LOS: 0 days    Time spent: 52 minutes spent on chart review, discussion with nursing staff, consultants, updating family and interview/physical exam; more than 50% of that time was spent in counseling and/or coordination of care.    Alvira Philips [place], DO [facility] Available via Epic secure chat 7am-7pm After these hours, please refer to coverage provider listed on [email or web address] 01/25/2023, 10:06 AM

## 2023-01-25 NOTE — TOC Initial Note (Signed)
Transition of Care [facility]) - Initial/Assessment Note    Patient Details  Name: Shane Holloway MRN: [id number] Date of Birth: [birth year]  Transition of Care [facility]) CM/SW Contact:    Ralene Bathe, LCSW Phone Number: 01/25/2023, 12:19 PM  Clinical Narrative:                 CSW received consult for possible SNF placement at time of discharge. CSW spoke with patient.  Patient expressed understanding of PT recommendation and is agreeable to SNF placement at time of discharge. Patient reports preference for [facility] and is agreeable to LCSW sending referral to other facilities in the area. CSW discussed insurance authorization process and will provide Medicare SNF ratings list. CSW will send out referrals for review and provide bed offers as available.   Skilled Nursing Rehab Facilities-   [email or web address]   Ratings out of 5 stars (5 the highest)   Name Address  Phone # Quality Care Staffing Health Inspection Overall  [facility] [address] [place] [phone] 2 1 5 4   [facility] [address] [place] [phone] 4 1 3 2   [facility] [address], [place] Otto [phone] 2 1 1 1   [facility] [address] [place] [phone] 4 1 3 2   [facility]  [address], [place] [phone] 3 2 5 5   [facility] [address], [facility] [phone] 2 1 2 1   [facility] [address], [place] [phone] 4 1 2 1   [facility] [address] [place] [phone] 2 4 3 3   [address] ([place]) [address] [place] [phone] 3 2 2 2   [facility] [address] [place], [place] [phone] 1 2 1 1   [facility] [address], [place] [phone] 3 1 1 1   [facility]igh[facility] [address] Liliane Shi [phone] 4 3 4 4   [facility] [address] [place] [phone] 3 4  3 3           [facility] [address], [place] [phone]      [facility], [place] [address], [place] [phone] 1 1 2 1   [facility] [address], [place] [phone] 2 2 4 4   [facility] [place] [address]Cheree Ditto [phone] 2 1 4 3   [facility] [address] [place] [phone] 3 3 3 3           [address] (no [facility]) [address], [place] [phone] 4 4 5 5   [facility] (No Hum[employer]) [address] Lavera Guise [phone] 2 2 4 4   [facility] [address], [place] [phone] 2 1 2 1   [facility] (No [facility]) [address], [place] [phone] 5 5 5 5   [facility] [address], [facility] [phone] 2 3 5 5   [facility] [address] [place] [phone] 2 1 1 1   [facility] [address] Liliane Shi [phone] 5 2 5 5   [facility]  [address], [place] [phone] 2 2 2 2   [facility] [address] [place] [phone] 4 2 1 1   [facility] [address] [place], [place] [phone] 2 2 3 3           [facility] [address], [place] [phone] 1 1 1 1   [facility] [address], Evlyn Clines  [phone] 2 3 3 3   [facility] (No Humana) [address]  [address], [place] [phone] 2 1 3 2   [facility]) [address], [place]al[place] Levan [phone] 1 1 1 1   [facility] [address], [place] [phone] 3 2 5 5   [facility] [place] [address], [place] [phone] 2 1 1 1           [facility] [address] [place], [place] [phone] 4 4 5 5   [facility])  [address] [place] [phone] 2 1 2 1   [facility]) [address] [place] [phone]  1 4 3   [facility] [address] [place] [phone] 3 5 4 5   [address] [place], [place] [phone] 3 2 2 2   [facility]) [address]  [place] [phone] 2 1 3 2     Expected Discharge Plan: Skilled Nursing Facility Barriers to Discharge: Insurance Authorization, Continued Medical Work up   Patient Goals and CMS Choice Patient states their goals for this hospitalization and ongoing recovery are:: To go to rehab CMS Medicare.gov Compare Post Acute Care list provided to:: Patient Choice offered to / list presented to : Patient      Expected Discharge Plan and Services In-house Referral: Clinical Social Work     Living arrangements for the past 2 months: Single Family Home                                      Prior Living Arrangements/Services Living arrangements for the past 2 months: Single Family Home Lives with:: Self Patient language and need for interpreter reviewed:: Yes Do you feel safe going back to the place where you live?: Yes      Need for Family Participation in Patient Care: Yes (Comment) Care giver support system in place?: Yes (comment)   Criminal Activity/Legal Involvement Pertinent to Current Situation/Hospitalization: No - Comment as needed  Activities of Daily Living Home Assistive Devices/Equipment: Cane (specify quad or straight), Walker (specify type) ADL Screening (condition at time of admission) Patient's cognitive ability adequate to safely complete daily activities?: Yes Is the patient deaf or have difficulty hearing?: No Does the patient have difficulty seeing, even when wearing glasses/contacts?: No Does the patient have difficulty concentrating, remembering, or making decisions?: No Patient able to express need for assistance with ADLs?: Yes Does the patient have difficulty dressing or bathing?: No Independently performs ADLs?: Yes (appropriate for developmental age) Does the patient have difficulty walking or climbing stairs?: Yes Weakness of Legs: Left Weakness of Arms/Hands: None  Permission Sought/Granted   Permission granted to share information  with : Yes, Verbal Permission Granted     Permission granted to share info w AGENCY: SNFs        Emotional Assessment       Orientation: : Oriented to Self, Oriented to Situation, Oriented to Place Alcohol / Substance Use: Not Applicable Psych Involvement: No (comment)  Admission diagnosis:  Acute CVA (cerebrovascular accident) (HCC) [I63.9] Cerebral infarction, unspecified mechanism (HCC) [I63.9] Patient Active Problem List   Diagnosis Date Noted   Acute CVA (cerebrovascular accident) (HCC) 01/24/2023   HFrEF (heart failure with reduced ejection fraction) (HCC) 02/28/2022   B12 deficiency anemia 12/19/2021   Thrombocytopenia (HCC) 12/16/2021   Obesity (BMI 30-39.9) 12/11/2021   Chronic kidney disease, stage 3b (HCC) 12/10/2021   SVT (supraventricular tachycardia) 03/20/2021   S/P aortic valve replacement with bioprosthetic valve  02/22/2021   Vascular dementia without behavioral disturbance (HCC) 09/13/2020   History of stroke 02/22/2014   Benign prostatic hyperplasia 04/16/2012   Uncontrolled type 2 diabetes mellitus with hyperglycemia, without long-term current use of insulin (HCC) 06/27/2009   CORONARY ATHEROSCLEROSIS, ARTERY BYPASS GRAFT 06/27/2009   Essential hypertension 11/24/2008   PCP:  Kurtis Bushman, MD Pharmacy:   [id number] - [address]), [place] - [address] [place]) [place] [address] Phone: [phone] Fax: [phone]  [facility] [address] [place] - [address] [place] [address] Phone: [phone] Fax: [phone]  [facility] [place], [place] - [address] [place] [address] Phone: [phone] Fax: [phone]     Social Determinants of Health (SDOH) Social History: SDOH Screenings   Food Insecurity: No Food Insecurity (01/24/2023)  Housing: Low Risk  (01/24/2023)  Transportation Needs: No  Transportation Needs (01/24/2023)  Utilities: Not At Risk (01/24/2023)  Depression (PHQ2-9): High Risk (05/20/2022)  Financial Resource Strain: Low Risk  (11/22/2022)   [facility]  Social Connections: Unknown (09/01/2020)   [facility] from [facility], [facility]  Tobacco Use: Medium Risk (01/24/2023)  Health Literacy: High Risk (03/28/2021)   [facility] from [facility], [facility]   SDOH Interventions:     Readmission Risk Interventions     No data to display

## 2023-01-25 NOTE — [place] FL2 (Signed)
[place] MEDICAID FL2 LEVEL OF CARE FORM     IDENTIFICATION  Patient Name: Shane Holloway Birthdate: [birth year] Sex: male Admission Date (Current Location): 01/24/2023  [facility] and [place] Number:  Producer, television/film/video and Address:  [facility], [address], [place], [place] [address]      Provider Number: [id number]  Attending Physician Name and Address:  [place], Eric J, DO  Relative Name and Phone Number:  Curry, Conk (Brother)  [phone]    Current Level of Care: Hospital Recommended Level of Care: Skilled Nursing Facility Prior Approval Number:    Date Approved/Denied:   PASRR Number: [id number]  Discharge Plan: SNF    Current Diagnoses: Patient Active Problem List   Diagnosis Date Noted   Acute CVA (cerebrovascular accident) (HCC) 01/24/2023   HFrEF (heart failure with reduced ejection fraction) (HCC) 02/28/2022   B12 deficiency anemia 12/19/2021   Thrombocytopenia (HCC) 12/16/2021   Obesity (BMI 30-39.9) 12/11/2021   Chronic kidney disease, stage 3b (HCC) 12/10/2021   SVT (supraventricular tachycardia) 03/20/2021   S/P aortic valve replacement with bioprosthetic valve 02/22/2021   Vascular dementia without behavioral disturbance (HCC) 09/13/2020   History of stroke 02/22/2014   Benign prostatic hyperplasia 04/16/2012   Uncontrolled type 2 diabetes mellitus with hyperglycemia, without long-term current use of insulin (HCC) 06/27/2009   CORONARY ATHEROSCLEROSIS, ARTERY BYPASS GRAFT 06/27/2009   Essential hypertension 11/24/2008    Orientation RESPIRATION BLADDER Height & Weight     Self, Situation, Place  Normal Continent Weight:   Height:  6' 1.5" (186.7 cm)  BEHAVIORAL SYMPTOMS/MOOD NEUROLOGICAL BOWEL NUTRITION STATUS      Continent Diet (see d/c summary)  AMBULATORY STATUS COMMUNICATION OF NEEDS Skin   Total Care Verbally Normal                       Personal Care Assistance Level of Assistance  Bathing,  Feeding, Dressing Bathing Assistance: Maximum assistance Feeding assistance: Limited assistance Dressing Assistance: Maximum assistance     Functional Limitations Info  Sight, Hearing, Speech Sight Info: Adequate Hearing Info: Adequate Speech Info: Adequate    SPECIAL CARE FACTORS FREQUENCY  PT (By licensed PT), OT (By licensed OT)     PT Frequency: 5x/ week OT Frequency: 5x/ week            Contractures Contractures Info: Not present    Additional Factors Info  Code Status, Allergies, Insulin Sliding Scale Code Status Info: FULL Allergies Info: Altace (Ramipril)  Shellfish Allergy  Sulfa Antibiotics   Insulin Sliding Scale Info: see discharge med list       Current Medications (01/25/2023):  This is the current hospital active medication list Current Facility-Administered Medications  Medication Dose Route Frequency Provider Last Rate Last Admin   acetaminophen (TYLENOL) tablet 650 mg  650 mg Oral Q4H PRN Synetta Fail, MD   650 mg at 01/25/23 1014   Or   acetaminophen (TYLENOL) 160 MG/5ML solution 650 mg  650 mg Per Tube Q4H PRN Synetta Fail, MD       Or   acetaminophen (TYLENOL) suppository 650 mg  650 mg Rectal Q4H PRN Synetta Fail, MD       amiodarone (PACERONE) tablet 200 mg  200 mg Oral Daily Synetta Fail, MD   200 mg at 01/25/23 1014   apixaban (ELIQUIS) tablet 5 mg  5 mg Oral BID de Saintclair Halsted, Cortney E, NP   5 mg at 01/25/23 1014  aspirin EC tablet 81 mg  81 mg Oral Daily Marvel Plan, MD   81 mg at 01/25/23 1014   atorvastatin (LIPITOR) tablet 80 mg  80 mg Oral Daily Marvel Plan, MD   80 mg at 01/25/23 1014   ezetimibe (ZETIA) tablet 10 mg  10 mg Oral Daily Synetta Fail, MD   10 mg at 01/25/23 1014   finasteride (PROSCAR) tablet 5 mg  5 mg Oral Daily Synetta Fail, MD   5 mg at 01/25/23 1014   insulin aspart (novoLOG) injection 0-6 Units  0-6 Units Subcutaneous TID WC Sundil, Subrina, MD   1 Units at 01/25/23 0701    insulin detemir (LEVEMIR) injection 10 Units  10 Units Subcutaneous QHS Sundil, Subrina, MD   10 Units at 01/24/23 2248   metoprolol succinate (TOPROL-XL) 24 hr tablet 50 mg  50 mg Oral Daily Synetta Fail, MD   50 mg at 01/25/23 1014   senna-docusate (Senokot-S) tablet 1 tablet  1 tablet Oral QHS PRN Synetta Fail, MD         Discharge Medications: Please see discharge summary for a list of discharge medications.  Relevant Imaging Results:  Relevant Lab Results:   Additional Information Patient's SS# [id number]  Catalina Pizza Baila Rouse, LCSW

## 2023-01-25 NOTE — Progress Notes (Signed)
OT Cancellation Note  Patient Details Name: Shane Holloway MRN: [id number] DOB: December 23, 1950   Cancelled Treatment:    Reason Eval/Treat Not Completed: Other (comment) Patient eating lunch at OT time of arrival, requesting OT return when he has finished his lunch. OT will follow back to complete evaluation as time permits.   Pollyann Glen E. Keyuna Cuthrell, OTR/L Acute Rehabilitation Services [phone]   Cherlyn Cushing 01/25/2023, 3:15 PM

## 2023-01-25 NOTE — Evaluation (Signed)
Physical Therapy Evaluation Patient Details Name: Shane Holloway MRN: [id number] DOB: 01/15/1951 Today's Date: 01/25/2023  History of Present Illness  72 y.o. male presented to [facility] ED on 8/16 with complaints of right lower extremity weakness, numbness. + Acute CVA. PMHx: HTN, HLD, chronic systolic/diastolic congestive heart failure, CKD stage IV, CAD s/p CABG, DM2, history of SVT, history of DVT on Eliquis, history of CVA, history of aortic valve repair, obesity.  Clinical Impression  Pt admitted with above diagnosis. Independent PTA however reports falling about a week ago causing some neck soreness. Lives alone with minimal to no support. Currently demonstrates significant RLE weakness, reduced proprioception, and absent sensation to light touch. Pt required up to mod assist for sit to stand transfers and pivot to chair with RW for support. Able to take a couple of steps with mod assist away from the bed but demonstrates very poor control of RLE to advance, Rt knee buckles, and cannot pull RLE backwards at this time. Patient will benefit from continued inpatient follow up therapy, <3 hours/day.  Pt currently with functional limitations due to the deficits listed below (see PT Problem List). Pt will benefit from acute skilled PT to increase their independence and safety with mobility to allow discharge.           If plan is discharge home, recommend the following: A lot of help with walking and/or transfers;A lot of help with bathing/dressing/bathroom;Assistance with cooking/housework;Direct supervision/assist for medications management;Direct supervision/assist for financial management;Assist for transportation;Help with stairs or ramp for entrance   Can travel by private vehicle   No    Equipment Recommendations None recommended by PT  Recommendations for Other Services       Functional Status Assessment Patient has had a recent decline in their functional status and  demonstrates the ability to make significant improvements in function in a reasonable and predictable amount of time.     Precautions / Restrictions Precautions Precautions: Fall Restrictions Weight Bearing Restrictions: No      Mobility  Bed Mobility Overal bed mobility: Needs Assistance Bed Mobility: Supine to Sit     Supine to sit: Contact guard     General bed mobility comments: CGA for safety, cues for technique, slow to rise and effortful.    Transfers Overall transfer level: Needs assistance Equipment used: Rolling walker (2 wheels) Transfers: Sit to/from Stand, Bed to chair/wheelchair/BSC Sit to Stand: Mod assist Stand pivot transfers: Mod assist Step pivot transfers: Mod assist       General transfer comment: Mod assist for boost to stand several times from bed. VC for technique, hand placement, and walker set-up. Mod assist for step pivot towards left, and stand pivot towards left with physical assist to advance RLE, block Rt knee, and manipulate RW into place for improved stability. Difficutly with weight shift, Rt knee buckles, able to advance RLE forward ~25% of the time, cannot pull RLE back.    Ambulation/Gait             Pre-gait activities: static march, poor RLE control but able to lift from ground.    Stairs            Wheelchair Mobility     Tilt Bed    Modified Rankin (Stroke Patients Only) Modified Rankin (Stroke Patients Only) Pre-Morbid Rankin Score: No symptoms Modified Rankin: Moderately severe disability     Balance Overall balance assessment: Needs assistance Sitting-balance support: No upper extremity supported, Feet supported Sitting balance-Leahy Scale: Fair  Postural control: Posterior lean Standing balance support: Bilateral upper extremity supported Standing balance-Leahy Scale: Poor                               Pertinent Vitals/Pain Pain Assessment Pain Assessment: Faces Faces Pain Scale:  Hurts little more Pain Location: neck Pain Descriptors / Indicators: Sore Pain Intervention(s): Monitored during session, Repositioned    Home Living Family/patient expects to be discharged to:: Private residence Living Arrangements: Alone Available Help at Discharge: Family (Brother lives near by; limited help) Type of Home: Mobile home Home Access: Ramped entrance       Home Layout: One level Home Equipment: Agricultural consultant (2 wheels)      Prior Function Prior Level of Function : Independent/Modified Independent;History of Falls (last six months)             Mobility Comments: Reports recent fall about 1 week ago. Does not drive. Otherwise ind. ADLs Comments: ind.     Extremity/Trunk Assessment   Upper Extremity Assessment Upper Extremity Assessment: Defer to OT evaluation    Lower Extremity Assessment Lower Extremity Assessment: RLE deficits/detail RLE Deficits / Details: no active movement Rt ankle and foot. 2+/5 Rt knee flexion, 2-/5 Rt knee flexion. Rt hip flexion 3-/5, Rt hip abduciton 2/5. RLE Sensation: decreased light touch;decreased proprioception (sensation grossly absent throughout RLE with light touch) RLE Coordination: decreased gross motor       Communication   Communication Communication: No apparent difficulties Cueing Techniques: Verbal cues;Tactile cues  Cognition Arousal: Alert Behavior During Therapy: WFL for tasks assessed/performed Overall Cognitive Status: No family/caregiver present to determine baseline cognitive functioning                                 General Comments: Aware of his dementia diagnosis; does not know year, incorrectly guess month as "december." Hold conversation appropriately, discussing prior career, current deficits.        General Comments General comments (skin integrity, edema, etc.): on 2L supplemental O2 when PT entered room.    Exercises     Assessment/Plan    PT Assessment Patient  needs continued PT services  PT Problem List Decreased strength;Decreased range of motion;Decreased activity tolerance;Decreased balance;Decreased mobility;Decreased coordination;Decreased cognition;Decreased knowledge of use of DME;Decreased safety awareness;Decreased knowledge of precautions;Impaired sensation;Impaired tone;Pain       PT Treatment Interventions DME instruction;Gait training;Functional mobility training;Therapeutic activities;Therapeutic exercise;Balance training;Neuromuscular re-education;Cognitive remediation;Patient/family education;Wheelchair mobility training;Modalities    PT Goals (Current goals can be found in the Care Plan section)  Acute Rehab PT Goals Patient Stated Goal: Get well PT Goal Formulation: With patient Time For Goal Achievement: 02/08/23 Potential to Achieve Goals: Good    Frequency Min 1X/week     Co-evaluation               AM-PAC PT "6 Clicks" Mobility  Outcome Measure Help needed turning from your back to your side while in a flat bed without using bedrails?: None Help needed moving from lying on your back to sitting on the side of a flat bed without using bedrails?: A Little Help needed moving to and from a bed to a chair (including a wheelchair)?: A Lot Help needed standing up from a chair using your arms (e.g., wheelchair or bedside chair)?: A Lot Help needed to walk in hospital room?: Total Help needed climbing 3-5 steps with a railing? :  Total 6 Click Score: 13    End of Session Equipment Utilized During Treatment: Gait belt;Oxygen Activity Tolerance: Patient tolerated treatment well Patient left: in chair;with call bell/phone within reach;with chair alarm set Nurse Communication: Mobility status PT Visit Diagnosis: Unsteadiness on feet (R26.81);Other abnormalities of gait and mobility (R26.89);Hemiplegia and hemiparesis;History of falling (Z91.81);Difficulty in walking, not elsewhere classified (R26.2);Pain Hemiplegia -  Right/Left: Right Hemiplegia - caused by: Cerebral infarction Pain - part of body:  (neck)    Time: 2130-8657 PT Time Calculation (min) (ACUTE ONLY): 27 min   Charges:   PT Evaluation $PT Eval Moderate Complexity: 1 Mod PT Treatments $Therapeutic Activity: 8-22 mins PT General Charges $$ ACUTE PT VISIT: 1 Visit         Kathlyn Sacramento, PT, DPT [facility]  Rehabilitation Services Physical Therapist Office: [phone] Website: [email or web address]   Berton Mount 01/25/2023, 11:30 AM

## 2023-01-26 DIAGNOSIS — I639 Cerebral infarction, unspecified: Secondary | ICD-10-CM | POA: Diagnosis not present

## 2023-01-26 LAB — GLUCOSE, CAPILLARY
Glucose-Capillary: 162 mg/dL — ABNORMAL HIGH (ref 70–99)
Glucose-Capillary: 173 mg/dL — ABNORMAL HIGH (ref 70–99)
Glucose-Capillary: 128 mg/dL — ABNORMAL HIGH (ref 70–99)
Glucose-Capillary: 159 mg/dL — ABNORMAL HIGH (ref 70–99)

## 2023-01-26 LAB — BASIC METABOLIC PANEL
Anion gap: 11 (ref 5–15)
BUN: 34 mg/dL — ABNORMAL HIGH (ref 8–23)
CO2: 21 mmol/L — ABNORMAL LOW (ref 22–32)
Calcium: 8.8 mg/dL — ABNORMAL LOW (ref 8.9–10.3)
Chloride: 106 mmol/L (ref 98–111)
Creatinine, Ser: 3.04 mg/dL — ABNORMAL HIGH (ref 0.61–1.24)
GFR, Estimated: 21 mL/min — ABNORMAL LOW (ref >60)
Glucose, Bld: 168 mg/dL — ABNORMAL HIGH (ref 70–99)
Potassium: 3.7 mmol/L (ref 3.5–5.1)
Sodium: 138 mmol/L (ref 135–145)

## 2023-01-26 LAB — MAGNESIUM: Magnesium: 2 mg/dL (ref 1.7–2.4)

## 2023-01-26 MED ORDER — INSULIN DETEMIR 100 UNIT/ML ~~LOC~~ SOLN
12.0000 [IU] | Freq: Every day | SUBCUTANEOUS | Status: DC
  Administered 2023-01-26: 12 [IU] via SUBCUTANEOUS
  Filled 2023-01-26 (×2): qty 0.12

## 2023-01-26 NOTE — Evaluation (Signed)
Occupational Therapy Evaluation Patient Details Name: Shane Holloway MRN: [id number] DOB: 05/28/1951 Today's Date: 01/26/2023   History of Present Illness 72 y.o. male presented to [facility] ED on 8/16 with complaints of right lower extremity weakness, numbness. + Acute CVA. PMHx: HTN, HLD, chronic systolic/diastolic congestive heart failure, CKD stage IV, CAD s/p CABG, DM2, history of SVT, history of DVT on Eliquis, history of CVA, history of aortic valve repair, obesity.   Clinical Impression   At baseline, pt completes ADLs Independent to Mod I and functional mobility without an AD Independent with pt reporting 1 fall approx. 1 week ago following a dentist appointment were several teeth were pulled. At baseline, pt receives assistance approx. 2x per week from a paid caregiver for IADLs and transportation. Pt now presents with decreased activity tolerance, decreased balance during functional tasks, visual deficits, decreased R UE strength, and decreased safety and independence with ADLs and functional transfers/mobility. Pt currently demonstrates ability to complete UB ADLs with Set up to Min assist, LB ADLs with Mod to Max assist, and functional transfers with a RW with Mod assist. Pt requires occasional to min cues for safety, sequencing, and technique throughout functional tasks. Pt will benefit from acute skilled OT services to address deficits outlined below, decrease caregiver burden, and increase safety and independence with ADLs, functional transfers, and functional mobility. Post acute discharge, pt will benefit from intensive inpatient skilled rehab services < 3 hours per day to maximize rehab potential.       If plan is discharge home, recommend the following: Two people to help with walking and/or transfers;A lot of help with bathing/dressing/bathroom;Assistance with cooking/housework;Assistance with feeding;Direct supervision/assist for medications management;Direct  supervision/assist for financial management;Assist for transportation;Help with stairs or ramp for entrance;Supervision due to cognitive status (Set up with self-feeding)    Functional Status Assessment  Patient has had a recent decline in their functional status and demonstrates the ability to make significant improvements in function in a reasonable and predictable amount of time.  Equipment Recommendations  Other (comment) (Defer to next level of care)    Recommendations for Other Services       Precautions / Restrictions Precautions Precautions: Fall Restrictions Weight Bearing Restrictions: No      Mobility Bed Mobility Overal bed mobility: Needs Assistance Bed Mobility: Supine to Sit, Sit to Supine     Supine to sit: Supervision, HOB elevated, Used rails (with increqased time) Sit to supine: Contact guard assist, Used rails (with increased time and cues for hand placement/technuique)        Transfers Overall transfer level: Needs assistance Equipment used: Rolling walker (2 wheels) Transfers: Sit to/from Stand, Bed to chair/wheelchair/BSC Sit to Stand: Mod assist     Step pivot transfers: Mod assist     General transfer comment: Mod assist for boost and VC for safety, sequencing, technique, hand placement, and walker set-up throughout. Mod assist for step pivot towards left and right.      Balance Overall balance assessment: Needs assistance Sitting-balance support: No upper extremity supported, Feet supported Sitting balance-Leahy Scale: Fair   Postural control: Posterior lean Standing balance support: Single extremity supported, Bilateral upper extremity supported, During functional activity, Reliant on assistive device for balance Standing balance-Leahy Scale: Poor                             ADL either performed or assessed with clinical judgement   ADL Overall ADL's : Needs  assistance/impaired Eating/Feeding: Set up;Sitting   Grooming:  Set up;Sitting   Upper Body Bathing: Minimal assistance;Cueing for safety;Cueing for sequencing;Sitting   Lower Body Bathing: Maximal assistance;Cueing for safety;Cueing for sequencing;Cueing for compensatory techniques;Sitting/lateral leans;Sit to/from stand   Upper Body Dressing : Minimal assistance;Sitting   Lower Body Dressing: Moderate assistance;Sitting/lateral leans;Sit to/from stand;Cueing for safety;Cueing for sequencing   Toilet Transfer: Moderate assistance;Cueing for safety;Cueing for sequencing;BSC/3in1;Rolling walker (2 wheels) (step-pivot)   Toileting- Clothing Manipulation and Hygiene: Maximal assistance;Cueing for safety;Cueing for sequencing;Cueing for compensatory techniques;Sit to/from stand         General ADL Comments: Pt presents with decreased activity tolerance and fatigued quickly this session with functional tasks in standing.     Vision Baseline Vision/History: 1 Wears glasses (Pt reports he is "supposed to" wear glasses but doesn't. Caregiver reports dry eyes and some difficulty with reading and blurriness at baseline) Ability to See in Adequate Light: 1 Impaired Patient Visual Report: Blurring of vision (Pt reports mild increase in blurriness over the past few days adn reports vision otherwise at baseline) Vision Assessment?: Yes Eye Alignment: Within Functional Limits Ocular Range of Motion: Within Functional Limits Alignment/Gaze Preference: Within Defined Limits Tracking/Visual Pursuits: Able to track stimulus in all quads without difficulty Saccades: Undershoots (mild) Convergence: Impaired (comment) (impaired convergence Bilaterally) Visual Fields: Right visual field deficit;Left visual field deficit;Right superior homonymous quadranopsia;Left superior homonymous quadranopsia Depth Perception: Undershoots (mild) Additional Comments: Pt with B convergence deficits and Right, Left, and B Superior field deficits     Perception         Praxis          Pertinent Vitals/Pain Pain Assessment Pain Assessment: No/denies pain Pain Intervention(s): Monitored during session     Extremity/Trunk Assessment Upper Extremity Assessment Upper Extremity Assessment: Right hand dominant;RUE deficits/detail;Overall [facility] for tasks assessed (L UE strength 5/5 overall; R UE strength 4/5 overall; B UE ROM WFL; mild B UE fine motor coordination deficits with coordination at baseline per caregiver report) RUE Deficits / Details: Strength overall 4/5. However, dominant R UE strength decreased compared to L UE.   Lower Extremity Assessment Lower Extremity Assessment: Defer to PT evaluation       Communication Communication Communication: No apparent difficulties   Cognition Arousal: Alert Behavior During Therapy: WFL for tasks assessed/performed Overall Cognitive Status: History of cognitive impairments - at baseline                                 General Comments: Pt alert and oriented to self, caregiver, place, and situation. Not oriented to time/year. Pt demonstrates ability to follow 1-step commands consistently. Pt with slow processing and requiring occasional to min cues for safety and sequencing during functional tasks.     General Comments  VSS on RA throughout session. Paid caregiver present during a portion of session.    Exercises     Shoulder Instructions      Home Living Family/patient expects to be discharged to:: Private residence Living Arrangements: Alone Available Help at Discharge: Personal care attendant;Family;Available PRN/intermittently (brother and nephew live nearby; has paid caregiver approx. 2x per week to assist with IADLs and transportation) Type of Home: Mobile home Home Access: Ramped entrance     Home Layout: One level     Bathroom Shower/Tub: Tub/shower unit;Walk-in shower   Bathroom Toilet: Standard     Home Equipment: Agricultural consultant (2 wheels)  Prior  Functioning/Environment Prior Level of Function : Needs assist;History of Falls (last six months)             Mobility Comments: Typically Independent without an AD. Reports recent fall about 1 week ago following having teeth pulled. Does not drive. ADLs Comments: Caretaking approx. 2x per week who assists with IADLs and transportation. Independent with ADLs.        OT Problem List: Decreased strength;Decreased activity tolerance;Impaired balance (sitting and/or standing);Impaired vision/perception;Decreased knowledge of use of DME or AE      OT Treatment/Interventions: Self-care/ADL training;Therapeutic exercise;DME and/or AE instruction;Therapeutic activities;Visual/perceptual remediation/compensation;Patient/family education;Balance training    OT Goals(Current goals can be found in the care plan section) Acute Rehab OT Goals Patient Stated Goal: To get better with therapy and go back home OT Goal Formulation: With patient Time For Goal Achievement: 02/09/23 Potential to Achieve Goals: Good ADL Goals Pt Will Perform Grooming: standing;with contact guard assist Pt Will Perform Lower Body Dressing: with contact guard assist;sit to/from stand Pt Will Transfer to Toilet: with contact guard assist;ambulating;bedside commode (with least restrictive AD) Pt Will Perform Toileting - Clothing Manipulation and hygiene: with contact guard assist;sitting/lateral leans;sit to/from stand Pt/caregiver will Perform Home Exercise Program: Increased strength;Right Upper extremity;With theraband;With Supervision;With written HEP provided (Increased activity tolerance)  OT Frequency: Min 1X/week    Co-evaluation              AM-PAC OT "6 Clicks" Daily Activity     Outcome Measure Help from another person eating meals?: A Little Help from another person taking care of personal grooming?: A Little Help from another person toileting, which includes using toliet, bedpan, or urinal?: A Lot Help  from another person bathing (including washing, rinsing, drying)?: A Lot Help from another person to put on and taking off regular upper body clothing?: A Little Help from another person to put on and taking off regular lower body clothing?: A Lot 6 Click Score: 15   End of Session Equipment Utilized During Treatment: Gait belt;Rolling walker (2 wheels) Nurse Communication: Mobility status;Other (comment) (OT POC)  Activity Tolerance: Patient tolerated treatment well Patient left: in bed;with call bell/phone within reach;with bed alarm set;with family/visitor present  OT Visit Diagnosis: Unsteadiness on feet (R26.81);Other abnormalities of gait and mobility (R26.89);History of falling (Z91.81);Other (comment) (Decreased activity tolerance)                Time: 6440-3474 OT Time Calculation (min): 34 min Charges:  OT General Charges $OT Visit: 1 Visit OT Evaluation $OT Eval Moderate Complexity: 1 Mod OT Treatments $Self Care/Home Management : 8-22 mins  Miu Chiong "Orson Eva., OTR/L, MA Acute Rehab [phone]   Lendon Colonel 01/26/2023, 1:47 PM

## 2023-01-26 NOTE — Progress Notes (Addendum)
STROKE TEAM PROGRESS NOTE   BRIEF HPI [name] is a 72 y.o. male with history of dementia, CHF, chronic renal failure, bioprosthetic aortic valve, DVT on Eliquis paused for recent dental procedure, BPH, diabetes, hypertension, hyperlipidemia, non-STEMI and 3 strokes who presented 8/16 with right leg weakness and numbness which he first noticed when he woke up this morning at 9:30 AM.  His last known normal time was 8:30 PM when he went to sleep last night.  He stated that about a week ago he had a cold and then fell and hit his head but did not obtain medical help for this.  He normally takes Eliquis, but this medication has been on hold due to bleeding after a dental procedure this week.  Patient is unsure how long Eliquis has been on hold but stated it has been at least a few days.  LKW 2030 8/15, NIH 4, mRS 2, outside of window for TNK, No thrombectomy due to completed stroke on MRI.   SIGNIFICANT HOSPITAL EVENTS 8/16: admitted for stroke workup due to small Acute Left Periorolandic Cortex Infarct seen on MRI 8/17: CUS showed incidental left internal jugular DVT, treatment dose of eliquis started.  8/18: MRV negative for CSVT.   INTERIM HISTORY/SUBJECTIVE   On exam, not oriented to month. Neuro exam stable. No acute neurological events.   OBJECTIVE  CBC    Component Value Date/Time   WBC 5.5 01/25/2023 0301   RBC 4.06 (L) 01/25/2023 0301   HGB 11.7 (L) 01/25/2023 0301   HCT 34.8 (L) 01/25/2023 0301   PLT 107 (L) 01/25/2023 0301   MCV 85.7 01/25/2023 0301   MCV 89.1 10/21/2012 1405   MCH 28.8 01/25/2023 0301   MCHC 33.6 01/25/2023 0301   RDW 13.7 01/25/2023 0301   LYMPHSABS 1.1 01/24/2023 1040   MONOABS 0.5 01/24/2023 1040   EOSABS 0.2 01/24/2023 1040   BASOSABS 0.1 01/24/2023 1040    BMET    Component Value Date/Time   NA 138 01/26/2023 0401   K 3.7 01/26/2023 0401   CL 106 01/26/2023 0401   CO2 21 (L) 01/26/2023 0401   GLUCOSE 168 (H) 01/26/2023 0401   BUN  34 (H) 01/26/2023 0401   CREATININE 3.04 (H) 01/26/2023 0401   CREATININE 0.79 09/23/2011 1201   CALCIUM 8.8 (L) 01/26/2023 0401   GFRNONAA 21 (L) 01/26/2023 0401    IMAGING past 24 hours MR MRV HEAD WO CM  Result Date: 01/26/2023 CLINICAL DATA:  Initial evaluation for possible dural venous sinus thrombosis. EXAM: MR VENOGRAM HEAD WITHOUT CONTRAST TECHNIQUE: Angiographic images of the intracranial venous structures were acquired using MRV technique without intravenous contrast. COMPARISON:  Prior MRI from 01/24/2023. FINDINGS: Normal flow related signal seen throughout the superior sagittal sinus to the torcula. Transverse and sigmoid sinuses are patent as are the jugular bulbs and visualized proximal internal jugular veins. Right transverse sinus dominant. Straight sinus, vein of Galen, internal cerebral veins, and basal veins of Rosenthal are patent. No evidence for dural venous sinus thrombosis. No appreciable cortical vein abnormality. No appreciable abnormality about the cavernous sinus. IMPRESSION: Normal intracranial MRV. No evidence for dural venous sinus thrombosis. Electronically Signed   By: Rise Mu M.D.   On: 01/26/2023 02:13   VAS US CAROTID  Result Date: 01/25/2023 Carotid Arterial Duplex Study Patient Name:  GISELLE CAPELL  Date of Exam:   01/25/2023 Medical Rec #: [id number]          Accession #:  [id number] Date of Birth: 07-02-1950           Patient Gender: M Patient Age:   56 years Exam Location:  [facility] Procedure:      VAS US CAROTID Referring Phys: Dewitt Hoes DE LA TORRE --------------------------------------------------------------------------------  Indications:       CVA, Numbness, Weakness and Patient on Eliquis for history of                    DVT. However, Eliquis was held several days post dental                    procedure. Risk Factors:      Hypertension, hyperlipidemia, Diabetes, prior MI, prior CVA X                    3. Other Factors:      Bioprosthetic aortic valve. Comparison Study:  No prior study on file Performing Technologist: Sherren Kerns RVS  Examination Guidelines: A complete evaluation includes B-mode imaging, spectral Doppler, color Doppler, and power Doppler as needed of all accessible portions of each vessel. Bilateral testing is considered an integral part of a complete examination. Limited examinations for reoccurring indications may be performed as noted.  Right Carotid Findings: +----------+--------+--------+--------+------------------+------------------+           PSV cm/sEDV cm/sStenosisPlaque DescriptionComments           +----------+--------+--------+--------+------------------+------------------+ CCA Prox  37      9                                 intimal thickening +----------+--------+--------+--------+------------------+------------------+ CCA Distal45      10              heterogenous                         +----------+--------+--------+--------+------------------+------------------+ ICA Prox  48      11              heterogenous                         +----------+--------+--------+--------+------------------+------------------+ ICA Mid   48      15                                                   +----------+--------+--------+--------+------------------+------------------+ ICA Distal40      11                                tortuous           +----------+--------+--------+--------+------------------+------------------+ ECA       43      7                                                    +----------+--------+--------+--------+------------------+------------------+ +----------+--------+-------+--------+-------------------+           PSV cm/sEDV cmsDescribeArm Pressure (mmHG) +----------+--------+-------+--------+-------------------+ Subclavian100                                        +----------+--------+-------+--------+-------------------+  +---------+--------+--+--------+-+  VertebralPSV cm/s25EDV cm/s7 +---------+--------+--+--------+-+  Left Carotid Findings: +----------+--------+--------+--------+------------------+--------+           PSV cm/sEDV cm/sStenosisPlaque DescriptionComments +----------+--------+--------+--------+------------------+--------+ CCA Prox  50      5               heterogenous               +----------+--------+--------+--------+------------------+--------+ CCA Distal52      13              heterogenous               +----------+--------+--------+--------+------------------+--------+ ICA Prox  76      16              calcific                   +----------+--------+--------+--------+------------------+--------+ ICA Mid   53      15                                         +----------+--------+--------+--------+------------------+--------+ ICA Distal51      15                                         +----------+--------+--------+--------+------------------+--------+ ECA       87      2               calcific                   +----------+--------+--------+--------+------------------+--------+ +----------+--------+--------+--------+-------------------+           PSV cm/sEDV cm/sDescribeArm Pressure (mmHG) +----------+--------+--------+--------+-------------------+ [id number]                                          +----------+--------+--------+--------+-------------------+ +---------+--------+--+--------+-+ VertebralPSV cm/s33EDV cm/s9 +---------+--------+--+--------+-+ Incidental DVT noted in the Internal Jugular Vein  Summary: Right Carotid: Velocities in the right ICA are consistent with a 1-39% stenosis. Left Carotid: Velocities in the left ICA are consistent with a 1-39% stenosis.               Incidental finding of acute DVT in the left internal jugular vein.               Right internal jugular vein and bilateral subclavian veins appear                patent. Vertebrals:  Bilateral vertebral arteries demonstrate antegrade flow. Subclavians: Normal flow hemodynamics were seen in bilateral subclavian              arteries. *See table(s) above for measurements and observations.  Electronically signed by Marvel Plan MD on 01/25/2023 at 6:30:41 PM.    Final    VAS [place] TRANSCRANIAL DOPPLER W BUBBLES  Result Date: 01/25/2023  Transcranial Doppler with Bubble Patient Name:  PARAM HURD  Date of Exam:   01/25/2023 Medical Rec #: [id number]          Accession #:    [id number] Date of Birth: 02-14-1951           Patient Gender: M Patient Age:   56 years Exam Location:  [facility] Procedure:      VAS [place] TRANSCRANIAL  DOPPLER W BUBBLES Referring Phys: Scheryl Marten Kenzlee Fishburn --------------------------------------------------------------------------------  Indications: Acute IJV DVT and stroke. Limitations for diagnostic windows: Unable to insonate right transtemporal window. Comparison Study: No prior study Performing Technologist: Sherren Kerns RVS  Examination Guidelines: A complete evaluation includes B-mode imaging, spectral Doppler, color Doppler, and power Doppler as needed of all accessible portions of each vessel. Bilateral testing is considered an integral part of a complete examination. Limited examinations for reoccurring indications may be performed as noted.  Summary: No HITS at rest or during Valsalva. Negative transcranial Doppler Bubble study with no evidence of right to left intracardiac communication.  *See table(s) above for TCD measurements and observations.  Diagnosing physician: Marvel Plan MD Electronically signed by Marvel Plan MD on 01/25/2023 at 6:28:25 PM.    Final    ECHOCARDIOGRAM COMPLETE BUBBLE STUDY  Result Date: 01/25/2023    ECHOCARDIOGRAM REPORT   Patient Name:   TAVARIUS HAGADORN Date of Exam: 01/25/2023 Medical Rec #:  [id number]         Height:       73.5 in Accession #:    [id number]        Weight:       232.0 lb Date of Birth:  02/18/1951           BSA:          2.303 m Patient Age:    72 years          BP:           126/81 mmHg Patient Gender: M                 HR:           60 bpm. Exam Location:  Inpatient Procedure: 3D Echo, Cardiac Doppler, Color Doppler and Saline Contrast Bubble            Study Indications:    Stroke I63.9  History:        Patient has prior history of Echocardiogram examinations, most                 recent 12/31/2021. Prior CABG, Stroke; Risk Factors:Diabetes,                 Hypertension and Former Smoker. S/P aortic valve replacement                 with bioprosthetic valve                 2022.                 Aortic Valve: bioprosthetic valve is present in the aortic                 position.  Sonographer:    Dondra Prader RVT RCS Referring Phys: [id number] Cecille Po MELVIN IMPRESSIONS  1. Left ventricular ejection fraction, by estimation, is 55 to 60%. The left ventricle has normal function. The left ventricle has no regional wall motion abnormalities. There is moderate concentric left ventricular hypertrophy. Left ventricular diastolic parameters are consistent with Grade I diastolic dysfunction (impaired relaxation).  2. Right ventricular systolic function is normal. The right ventricular size is normal. Tricuspid regurgitation signal is inadequate for assessing PA pressure.  3. The mitral valve is grossly normal. Trivial mitral valve regurgitation. No evidence of mitral stenosis.  4. The aortic valve has been repaired/replaced. Aortic valve regurgitation is mild. There is a bioprosthetic valve present in the aortic position.  5. Aortic dilatation  noted. There is mild dilatation of the aortic root, measuring 41 mm. Comparison(s): Changes from prior study are noted. The left ventricular function has improved. 12/31/21- EF 35-40%. Conclusion(s)/Recommendation(s): No intracardiac source of embolism detected on this transthoracic study. Consider a transesophageal echocardiogram to exclude cardiac source of embolism if clinically  indicated. FINDINGS  Left Ventricle: Left ventricular ejection fraction, by estimation, is 55 to 60%. The left ventricle has normal function. The left ventricle has no regional wall motion abnormalities. The left ventricular internal cavity size was normal in size. There is  moderate concentric left ventricular hypertrophy. Abnormal (paradoxical) septal motion consistent with post-operative status. Left ventricular diastolic parameters are consistent with Grade I diastolic dysfunction (impaired relaxation). Right Ventricle: The right ventricular size is normal. No increase in right ventricular wall thickness. Right ventricular systolic function is normal. Tricuspid regurgitation signal is inadequate for assessing PA pressure. Left Atrium: Left atrial size was normal in size. Right Atrium: Right atrial size was normal in size. Pericardium: There is no evidence of pericardial effusion. Presence of epicardial fat layer. Mitral Valve: The mitral valve is grossly normal. Trivial mitral valve regurgitation. No evidence of mitral valve stenosis. Tricuspid Valve: The tricuspid valve is grossly normal. Tricuspid valve regurgitation is not demonstrated. No evidence of tricuspid stenosis. Aortic Valve: The aortic valve has been repaired/replaced. Aortic valve regurgitation is mild. Aortic valve mean gradient measures 6.7 mmHg. Aortic valve peak gradient measures 12.3 mmHg. Aortic valve area, by VTI measures 3.73 cm. There is a bioprosthetic valve present in the aortic position. Pulmonic Valve: The pulmonic valve was grossly normal. Pulmonic valve regurgitation is trivial. No evidence of pulmonic stenosis. Aorta: Aortic dilatation noted. There is mild dilatation of the aortic root, measuring 41 mm. Venous: The inferior vena cava was not well visualized. IAS/Shunts: The atrial septum is grossly normal. Agitated saline contrast was given intravenously to evaluate for intracardiac shunting.  LEFT VENTRICLE PLAX 2D LVIDd:          5.30 cm   Diastology LVIDs:         3.60 cm   LV e' medial:    4.26 cm/s LV PW:         1.50 cm   LV E/e' medial:  9.4 LV IVS:        1.50 cm   LV e' lateral:   4.40 cm/s LVOT diam:     2.87 cm   LV E/e' lateral: 9.1 LV SV:         143 LV SV Index:   62 LVOT Area:     6.45 cm  RIGHT VENTRICLE            IVC RV S prime:     4.26 cm/s  IVC diam: 1.50 cm LEFT ATRIUM             Index LA diam:        4.40 cm 1.91 cm/m LA Vol (A2C):   46.7 ml 20.27 ml/m LA Vol (A4C):   50.8 ml 22.05 ml/m LA Biplane Vol: 50.1 ml 21.75 ml/m  AORTIC VALVE                     PULMONIC VALVE AV Area (Vmax):    3.94 cm      PV Vmax:       0.66 m/s AV Area (Vmean):   3.72 cm      PV Peak grad:  1.7 mmHg AV Area (VTI):     3.73 cm  AV Vmax:           175.67 cm/s AV Vmean:          118.667 cm/s AV VTI:            0.382 m AV Peak Grad:      12.3 mmHg AV Mean Grad:      6.7 mmHg LVOT Vmax:         107.27 cm/s LVOT Vmean:        68.333 cm/s LVOT VTI:          0.221 m LVOT/AV VTI ratio: 0.58  AORTA Ao Root diam: 3.70 cm Ao Asc diam:  3.20 cm MITRAL VALVE MV Area (PHT): 2.68 cm    SHUNTS MV Decel Time: 284 msec    Systemic VTI:  0.22 m MV E velocity: 39.95 cm/s  Systemic Diam: 2.87 cm MV A velocity: 56.00 cm/s MV E/A ratio:  0.71 Lennie Odor MD Electronically signed by Lennie Odor MD Signature Date/Time: 01/25/2023/5:36:34 PM    Final     Vitals:   01/26/23 1610 01/26/23 0733 01/26/23 0923 01/26/23 1211  BP: 117/72 124/69  130/72  Pulse: 60 63  64  Resp:   18   Temp: 97.9 F (36.6 C) 97.7 F (36.5 C)  98.2 F (36.8 C)  TempSrc: Oral Oral  Oral  SpO2: 95% 97%  97%  Height:        PHYSICAL EXAM General:  Alert, well-nourished, well-developed patient in no acute distress Psych:  Mood and affect appropriate for situation CV: Regular rate and rhythm on monitor Respiratory:  Regular, unlabored respirations on room air GI: Abdomen soft and nontender  NEURO:  Mental Status: AA&O to person, place, situation, year. Confused to  month , patient is able to give clear and coherent history Speech/Language: speech is without dysarthria or aphasia.  Naming, repetition, fluency, and comprehension intact.  Cranial Nerves:  II: PERRL. Visual fields full.  III, IV, VI: EOMI. Eyelids elevate symmetrically.  V: Sensation is intact to light touch and symmetrical to face.  VII: Face is symmetrical resting and smiling VIII: hearing intact to voice. IX, X: Palate elevates symmetrically. Phonation is normal.  RU:[id number] shrug 5/5. XII: tongue is midline without fasciculations. Motor: RLE 3/5 proximal, 0/5 distal, unable to wiggle toes Tone: is normal and bulk is normal Sensation- Diminished to light touch throughout RLE Coordination: FTN intact bilaterally, HKS: no ataxia in BLE.No drift.  Gait- deferred  ASSESSMENT/PLAN  Acute Small Left ACA infarct, etiology:  recent temporary stoppage of Eliquis for dental bleeding post procedure  Code Stroke CT head: New hypodensity with loss of gray-white differentiation along the undersurface of the left temporal occipital region  XBJ:YNWGN acute infarct in the superior left perirolandic cortex  MRA: Negative for LVO. 2mm outpouching favored to reflect small vascular infudibulum. Carotid Doppler unremarkable 2D Echo EF 55-60% LDL 29 HgbA1c 8.8 UDS neg VTE prophylaxis - eliquis ASA 81 prior to admission (eliquis on hold for oral bleeding post dental procedure), now resumed Eliquis (apixaban) daily (treatment dose) and ASA 81.  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 incidental finding of left internal jugular and subclavian V DVT, treated with [facility] and repeat testing in 02/2021 showed resolution  Current CUS showed again incidental finding of acute DVT at left internal jugular  TCD bubble study no PFO Now on eliquis treatment dosing  MRV negative for CSVT  Hypertension Combined CHF Aortic Stenosis Home meds:  Amiodarone 200mg , Toprol-XL  50mg ,  Entresto, amlodipine 2.5mg  BP stable Long term BP goal normotensive  Hyperlipidemia Home meds:  Lipitor 80mg , Zetia 10mg , resumed in hospital LDL 29, goal < 70 Continue statin at discharge  Diabetes type II Uncontrolled Home meds:  Insulin NPH, Ozempic, Jardiance HgbA1c 8.8, goal < 7.0 CBGs SSI Recommend close follow-up with PCP for better DM control  Other Stroke Risk Factors Obesity, Body mass index is 30.19 kg/m., BMI >/= 30 associated with increased stroke risk, recommend weight loss, diet and exercise as appropriate  Former [facility] day # 1   Pt seen by Neuro NP/APP and later by MD. Note/plan to be edited by MD as needed.    Lynnae January, DNP, AGACNP-BC [facility] Please use AMION for contact information & EPIC for messaging.  ATTENDING NOTE: I reviewed above note and agree with the assessment and plan. Pt was seen and examined.   No family bedside.  Patient lying bed, neuro stable, unchanged.  MRV overnight no CSVT.  Continue Eliquis aspirin and statin.  PT and OT recommend SNF.  For detailed assessment and plan, please refer to above/below as I have made changes wherever appropriate.   Neurology will sign off. Please recall with any new needs/concerns.  Patient is OK for discharge from neurology standpoint, with recommendations as above. Follow-up with outpatient neurology in 4 weeks.   Marvel Plan, MD PhD Stroke Neurology 01/26/2023 6:41 PM      To contact Stroke Continuity provider, please refer to [email or web address]. After hours, contact General Neurology

## 2023-01-26 NOTE — Plan of Care (Signed)
  Problem: Education: Goal: Knowledge of disease or condition will improve Outcome: Progressing   Problem: Ischemic Stroke/TIA Tissue Perfusion: Goal: Complications of ischemic stroke/TIA will be minimized Outcome: Progressing   Problem: Coping: Goal: Will identify appropriate support needs Outcome: Progressing   Problem: Health Behavior/Discharge Planning: Goal: Ability to manage health-related needs will improve Outcome: Progressing Goal: Goals will be collaboratively established with patient/family Outcome: Progressing   Problem: Self-Care: Goal: Ability to participate in self-care as condition permits will improve Outcome: Progressing Goal: Ability to communicate needs accurately will improve Outcome: Progressing   Problem: Education: Goal: Knowledge of General Education information will improve Description: Including pain rating scale, medication(s)/side effects and non-pharmacologic comfort measures Outcome: Progressing   Problem: Health Behavior/Discharge Planning: Goal: Ability to manage health-related needs will improve Outcome: Progressing

## 2023-01-26 NOTE — Progress Notes (Signed)
PROGRESS NOTE    Shane Holloway  DGL:[id number] DOB: May 31, 1951 DOA: 01/24/2023 PCP: Kurtis Bushman, MD    Brief Narrative:   Shane Holloway is a 72 y.o. male with past medical history significant for HTN, HLD, chronic systolic/diastolic congestive heart failure, CKD stage IV, CAD s/p CABG, DM2, history of SVT, history of DVT on Eliquis, history of CVA, history of aortic valve repair, obesity who presented to [facility] ED on 8/16 with complaints of right lower extremity weakness, numbness in which she noted at 9:30 AM on 8/16.  Last known normal 8:30 PM on 8/15.  Patient reports takes Eliquis, but has been on hold due to bleeding after dental procedure earlier in the week.  Unsure how many days he has been holding this but has been Elisse a few days.  Denies fever/chills, no chest pain, no shortness of breath, abdominal pain, no nausea/vomiting/diarrhea.  In the ED, temperature 98.6 F, HR 65, RR 16, BP 130/83, SpO2 95% on room air.  WBC 5.4, hemoglobin 12.2, platelets 121.  Sodium 144, potassium 4.3, chloride 107, CO2 23, glucose 191, BUN 33, creatinine 3.25.  AST 20, ALT 28, total bilirubin 0.6.  INR 1.0.  Urinalysis small leukocytes, negative nitrite, rare bacteria, greater than 50 WBCs.  UDS negative.  EtOH level less than 10.  CT head without contrast with new hypodensity loss of gray/white differentiation along the undersurface of the left temporal occipital region consistent with interval infarct, no hemorrhage.  MRI brain without contrast with small acute infarct superior left perirolandic cortex, widespread chronic ischemic injury.  MRA head negative for large vessel occlusion.  Neurology was consulted.  TRH consulted for admission for further evaluation management of acute CVA.  Assessment & Plan:   Acute CVA Patient presenting to ED with right lower extremity weakness associated with paresthesias.  Last known normal 8:30 PM on 8/15.  CT head without contrast with  abnormality left temporal/occipital region, no hemorrhage.  MRI brain with acute infarct superior left perirolandic cortex.  MRA head negative for large vessel occlusion.  Hemoglobin A1c 8.8, LDL 29. TTE: LVEF 55-60%, G1DD, 41 mm aortic root dilation, no intra or cardiac source of embolism.  Carotid ultrasound with 1-39% stenosis bilateral ICA with incidental finding of acute DVT left internal jugular vein, bilateral vertebral arteries antegrade flow.  MRV with no dural venous thrombosis. -- Neurology following, appreciate assistance -- Continue to hold antihypertensives with goal systolic <220 and diastolic <120 -- Carotid ultrasound:  -- PT/OT/SLP evaluation: Recommend SNF -- Aspirin 81 mg p.o. daily -- Atorvastatin 80 mg p.o. daily -- Eliquis 10 mg p.o. twice daily x 7 days; followed by 5mg  PO BID -- Continue monitor on telemetry  CKD stage IV Baseline creatinine 2.8-2.9; creatinine on admission 3.25, stable -- Cr 3.25>3.40>2.66>3.04 -- Avoid nephrotoxins, renally dose all medications -- BMP daily  Hypokalemia Repleted, potassium 3.7 this morning. -- Repeat electrolytes in a.m.   Essential hypertension Chronic systolic/diastolic congestive heart failure Hx SVT Most recent TEE 2023 with LVEF 35-40%, grade 1 diastolic dysfunction.  Home regimen includes amlodipine 2.5 mg p.o. daily, Entresto 49-51 mg p.o. twice daily, metoprolol succinate 50 mg p.o. daily, amiodarone 200 mg p.o. daily. --Repeat TTE: With improved LVEF 55-60%, G1DD -- Metoprolol succinate 50 mg p.o. daily -- Amiodarone 20 mg p.o. daily -- Hold home Entresto, amlodipine to allow permissive hypertension as above -- Monitor on telemetry -- Strict I's and O's and daily weights  Hx aortic valve replacement Bioprosthetic aortic  valve replacement 2015.  HLD Lipid panel with total cholesterol 90, HDL 23, LDL 29, triglycerides 188. -- Zetia 10 mg p.o. daily -- Atorvastatin 80 mg p.o. daily  Acute left IJ DVT Hx  DVT Started on Eliquis June 2022 for L IJ and subclavian DVT.  Seen by hematology 03/2021 and given unprovoked VTE and concern for embolic strokes, favor long-term anticoagulation.  Insil finding of acute DVT left IJ on carotid ultrasound. --Continue Eliquis 10 mg p.o. twice daily x 7 days followed by 5mg  PO BID  Type 2 diabetes mellitus, with hyperglycemia Hemoglobin A1c 8.8, not optimally controlled.  Home regimen includes NPH 12 units Dubois daily with breakfast, Jardiance, semaglutide. -- Levemir 12units Rainelle daily -- SSI for coverage -- CBGs qAC/HS  BPH -- Finasteride 5 mg p.o. daily  Dementia, mild --Delirium precautions --Get up during the day --Encourage a familiar face to remain present throughout the day --Keep blinds open and lights on during daylight hours --Minimize the use of opioids/benzodiazepines  Obesity Complicates all facets of care   DVT prophylaxis:  apixaban (ELIQUIS) tablet 10 mg  apixaban (ELIQUIS) tablet 5 mg    Code Status: Full Code Family Communication: No family present at bedside this morning; updated niece via telephone yesterday afternoon  Disposition Plan:  Level of care: Telemetry Medical Status is: Inpatient Remains inpatient appropriate because: Pending SNF placement, medically stable for discharge once bed available      Consultants:  Neurology  Procedures:  TTE Carotid ultrasound  Antimicrobials:  None   Subjective: Patient seen examined bedside, resting comfortably.  Lying in bed.  Reports right lower extremity weakness improved.  Updated patient regarding acute/recurrent left IJ DVT likely precipitated by his holding of his Eliquis for dental procedure prior to hospitalization.  No other specific questions or concerns at this time.  Denies headache, no dizziness, no visual changes, no chest pain, no palpitations, no shortness of breath, no abdominal pain, no fever/chills/night sweats, no nausea/vomiting/diarrhea, no fatigue.  No  acute events overnight per nurse staff.  Objective: Vitals:   01/25/23 2352 01/26/23 0352 01/26/23 0733 01/26/23 0923  BP: 126/80 117/72 124/69   Pulse: 66 60 63   Resp: 17   18  Temp: 97.7 F (36.5 C) 97.9 F (36.6 C) 97.7 F (36.5 C)   TempSrc:  Oral Oral   SpO2: 99% 95% 97%   Height:        Intake/Output Summary (Last 24 hours) at 01/26/2023 1009 Last data filed at 01/26/2023 0734 Gross per 24 hour  Intake --  Output 1600 ml  Net -1600 ml   There were no vitals filed for this visit.  Examination:  Physical Exam: GEN: NAD, alert and oriented to person/place (hospital/[place]), but not time (no year given), or person (President" no answer given), chronically ill in appearance, appears older than stated age HEENT: NCAT, PERRL, EOMI, sclera clear, poor dentition, dry mucous membranes PULM: CTAB w/o wheezes/crackles, normal respiratory effort, on room air CV: RRR w/o M/G/R GI: abd soft, NTND, NABS, no R/G/M MSK: no peripheral edema, muscle strength decreased 4/5 right lower extremity  PSYCH: normal mood/affect Integumentary: dry/intact, no rashes or wounds  Neuro Exam Mental Status: A&O to self/place but not time or person, no dysarthria, no aphasia  Cranial Nerves: visual fields full, PERRL, EOMi, intact smooth pursuit, no nystagmus, no ptosis, facial sensation intact bilaterally, 5/5 jaw strength, nasolabial fold & smile symetric,  eyebrow  raise & 5/5 eye closure symetric, hearing symmetric and normal to rubbing  fingers, palate elevates symmetrically, head turning and shoulder shrug intact and symetric bilaterally, tongue protrusion is midline Motor: no pronator drift, LUE 5/5,   LLE 5/5,   RUE 5/5,   RLE 4/5   Sensory: Sensation decreased to light touch anterior right shin Coordination/Movement: no tremor noted, no dysmetria   Data Reviewed: I have personally reviewed following labs and imaging studies  CBC: Recent Labs  Lab 01/24/23 1040 01/24/23 1055 01/25/23 0301   WBC 5.4  --  5.5  NEUTROABS 3.6  --   --   HGB 12.2* 11.6* 11.7*  HCT 36.2* 34.0* 34.8*  MCV 89.6  --  85.7  PLT 121*  --  107*   Basic Metabolic Panel: Recent Labs  Lab 01/24/23 1040 01/24/23 1055 01/25/23 0301 01/26/23 0401  NA 144 143 141 138  K 4.3 4.2 3.4* 3.7  CL 107 108 108 106  CO2 23  --  22 21*  GLUCOSE 191* 180* 157* 168*  BUN 33* 31* 29* 34*  CREATININE 3.25* 3.40* 2.66* 3.04*  CALCIUM 9.2  --  8.9 8.8*  MG  --   --   --  2.0   GFR: Estimated Creatinine Clearance: 27.3 mL/min (A) (by C-G formula based on SCr of 3.04 mg/dL (H)). Liver Function Tests: Recent Labs  Lab 01/24/23 1040 01/25/23 0301  AST 20 19  ALT 28 23  ALKPHOS 83 72  BILITOT 0.6 0.6  PROT 5.7* 5.5*  ALBUMIN 3.4* 3.1*   No results for input(s): "LIPASE", "AMYLASE" in the last 168 hours. No results for input(s): "AMMONIA" in the last 168 hours. Coagulation Profile: Recent Labs  Lab 01/24/23 1040  INR 1.0   Cardiac Enzymes: No results for input(s): "CKTOTAL", "CKMB", "CKMBINDEX", "TROPONINI" in the last 168 hours. BNP (last 3 results) No results for input(s): "PROBNP" in the last 8760 hours. HbA1C: Recent Labs    01/25/23 0301  HGBA1C 8.8*   CBG: Recent Labs  Lab 01/25/23 0608 01/25/23 1152 01/25/23 1801 01/25/23 2058 01/26/23 0557  GLUCAP 151* 198* 173* 163* 162*   Lipid Profile: Recent Labs    01/25/23 0301  CHOL 90  HDL 23*  LDLCALC 29  TRIG 865*  CHOLHDL 3.9   Thyroid Function Tests: No results for input(s): "TSH", "T4TOTAL", "FREET4", "T3FREE", "THYROIDAB" in the last 72 hours. Anemia Panel: No results for input(s): "VITAMINB12", "FOLATE", "FERRITIN", "TIBC", "IRON", "RETICCTPCT" in the last 72 hours. Sepsis Labs: No results for input(s): "PROCALCITON", "LATICACIDVEN" in the last 168 hours.  No results found for this or any previous visit (from the past 240 hour(s)).       Radiology Studies: MR MRV HEAD WO CM  Result Date: 01/26/2023 CLINICAL  DATA:  Initial evaluation for possible dural venous sinus thrombosis. EXAM: MR VENOGRAM HEAD WITHOUT CONTRAST TECHNIQUE: Angiographic images of the intracranial venous structures were acquired using MRV technique without intravenous contrast. COMPARISON:  Prior MRI from 01/24/2023. FINDINGS: Normal flow related signal seen throughout the superior sagittal sinus to the torcula. Transverse and sigmoid sinuses are patent as are the jugular bulbs and visualized proximal internal jugular veins. Right transverse sinus dominant. Straight sinus, vein of Galen, internal cerebral veins, and basal veins of Rosenthal are patent. No evidence for dural venous sinus thrombosis. No appreciable cortical vein abnormality. No appreciable abnormality about the cavernous sinus. IMPRESSION: Normal intracranial MRV. No evidence for dural venous sinus thrombosis. Electronically Signed   By: Rise Mu M.D.   On: 01/26/2023 02:13   VAS US CAROTID  Result Date: 01/25/2023 Carotid Arterial Duplex Study Patient Name:  Shane Holloway  Date of Exam:   01/25/2023 Medical Rec #: [id number]          Accession #:    [id number] Date of Birth: 12-02-1950           Patient Gender: M Patient Age:   2 years Exam Location:  [facility] Procedure:      VAS US CAROTID Referring Phys: Dewitt Hoes DE LA TORRE --------------------------------------------------------------------------------  Indications:       CVA, Numbness, Weakness and Patient on Eliquis for history of                    DVT. However, Eliquis was held several days post dental                    procedure. Risk Factors:      Hypertension, hyperlipidemia, Diabetes, prior MI, prior CVA X                    3. Other Factors:     Bioprosthetic aortic valve. Comparison Study:  No prior study on file Performing Technologist: Sherren Kerns RVS  Examination Guidelines: A complete evaluation includes B-mode imaging, spectral Doppler, color Doppler, and power Doppler as needed of all  accessible portions of each vessel. Bilateral testing is considered an integral part of a complete examination. Limited examinations for reoccurring indications may be performed as noted.  Right Carotid Findings: +----------+--------+--------+--------+------------------+------------------+           PSV cm/sEDV cm/sStenosisPlaque DescriptionComments           +----------+--------+--------+--------+------------------+------------------+ CCA Prox  37      9                                 intimal thickening +----------+--------+--------+--------+------------------+------------------+ CCA Distal45      10              heterogenous                         +----------+--------+--------+--------+------------------+------------------+ ICA Prox  48      11              heterogenous                         +----------+--------+--------+--------+------------------+------------------+ ICA Mid   48      15                                                   +----------+--------+--------+--------+------------------+------------------+ ICA Distal40      11                                tortuous           +----------+--------+--------+--------+------------------+------------------+ ECA       43      7                                                    +----------+--------+--------+--------+------------------+------------------+ +----------+--------+-------+--------+-------------------+  PSV cm/sEDV cmsDescribeArm Pressure (mmHG) +----------+--------+-------+--------+-------------------+ Subclavian100                                        +----------+--------+-------+--------+-------------------+ +---------+--------+--+--------+-+ VertebralPSV cm/s25EDV cm/s7 +---------+--------+--+--------+-+  Left Carotid Findings: +----------+--------+--------+--------+------------------+--------+           PSV cm/sEDV cm/sStenosisPlaque DescriptionComments  +----------+--------+--------+--------+------------------+--------+ CCA Prox  50      5               heterogenous               +----------+--------+--------+--------+------------------+--------+ CCA Distal52      13              heterogenous               +----------+--------+--------+--------+------------------+--------+ ICA Prox  76      16              calcific                   +----------+--------+--------+--------+------------------+--------+ ICA Mid   53      15                                         +----------+--------+--------+--------+------------------+--------+ ICA Distal51      15                                         +----------+--------+--------+--------+------------------+--------+ ECA       87      2               calcific                   +----------+--------+--------+--------+------------------+--------+ +----------+--------+--------+--------+-------------------+           PSV cm/sEDV cm/sDescribeArm Pressure (mmHG) +----------+--------+--------+--------+-------------------+ [id number]                                          +----------+--------+--------+--------+-------------------+ +---------+--------+--+--------+-+ VertebralPSV cm/s33EDV cm/s9 +---------+--------+--+--------+-+ Incidental DVT noted in the Internal Jugular Vein  Summary: Right Carotid: Velocities in the right ICA are consistent with a 1-39% stenosis. Left Carotid: Velocities in the left ICA are consistent with a 1-39% stenosis.               Incidental finding of acute DVT in the left internal jugular vein.               Right internal jugular vein and bilateral subclavian veins appear               patent. Vertebrals:  Bilateral vertebral arteries demonstrate antegrade flow. Subclavians: Normal flow hemodynamics were seen in bilateral subclavian              arteries. *See table(s) above for measurements and observations.  Electronically signed by Marvel Plan  MD on 01/25/2023 at 6:30:41 PM.    Final    VAS [place] TRANSCRANIAL DOPPLER W BUBBLES  Result Date: 01/25/2023  Transcranial Doppler with Bubble Patient Name:  Shane Holloway  Date of Exam:   01/25/2023 Medical Rec #: [id number]  Accession #:    [id number] Date of Birth: 11/06/1950           Patient Gender: M Patient Age:   34 years Exam Location:  [facility] Procedure:      VAS [place] TRANSCRANIAL DOPPLER W BUBBLES Referring Phys: Scheryl Marten XU --------------------------------------------------------------------------------  Indications: Acute IJV DVT and stroke. Limitations for diagnostic windows: Unable to insonate right transtemporal window. Comparison Study: No prior study Performing Technologist: Sherren Kerns RVS  Examination Guidelines: A complete evaluation includes B-mode imaging, spectral Doppler, color Doppler, and power Doppler as needed of all accessible portions of each vessel. Bilateral testing is considered an integral part of a complete examination. Limited examinations for reoccurring indications may be performed as noted.  Summary: No HITS at rest or during Valsalva. Negative transcranial Doppler Bubble study with no evidence of right to left intracardiac communication.  *See table(s) above for TCD measurements and observations.  Diagnosing physician: Marvel Plan MD Electronically signed by Marvel Plan MD on 01/25/2023 at 6:28:25 PM.    Final    ECHOCARDIOGRAM COMPLETE BUBBLE STUDY  Result Date: 01/25/2023    ECHOCARDIOGRAM REPORT   Patient Name:   Shane Holloway Date of Exam: 01/25/2023 Medical Rec #:  [id number]         Height:       73.5 in Accession #:    [id number]        Weight:       232.0 lb Date of Birth:  November 12, 1950          BSA:          2.303 m Patient Age:    72 years          BP:           126/81 mmHg Patient Gender: M                 HR:           60 bpm. Exam Location:  Inpatient Procedure: 3D Echo, Cardiac Doppler, Color Doppler and Saline Contrast Bubble             Study Indications:    Stroke I63.9  History:        Patient has prior history of Echocardiogram examinations, most                 recent 12/31/2021. Prior CABG, Stroke; Risk Factors:Diabetes,                 Hypertension and Former Smoker. S/P aortic valve replacement                 with bioprosthetic valve                 2022.                 Aortic Valve: bioprosthetic valve is present in the aortic                 position.  Sonographer:    Dondra Prader RVT RCS Referring Phys: [id number] Cecille Po MELVIN IMPRESSIONS  1. Left ventricular ejection fraction, by estimation, is 55 to 60%. The left ventricle has normal function. The left ventricle has no regional wall motion abnormalities. There is moderate concentric left ventricular hypertrophy. Left ventricular diastolic parameters are consistent with Grade I diastolic dysfunction (impaired relaxation).  2. Right ventricular systolic function is normal. The right ventricular size is normal. Tricuspid regurgitation signal is inadequate for assessing PA pressure.  3. The mitral valve is grossly normal. Trivial mitral valve regurgitation. No evidence of mitral stenosis.  4. The aortic valve has been repaired/replaced. Aortic valve regurgitation is mild. There is a bioprosthetic valve present in the aortic position.  5. Aortic dilatation noted. There is mild dilatation of the aortic root, measuring 41 mm. Comparison(s): Changes from prior study are noted. The left ventricular function has improved. 12/31/21- EF 35-40%. Conclusion(s)/Recommendation(s): No intracardiac source of embolism detected on this transthoracic study. Consider a transesophageal echocardiogram to exclude cardiac source of embolism if clinically indicated. FINDINGS  Left Ventricle: Left ventricular ejection fraction, by estimation, is 55 to 60%. The left ventricle has normal function. The left ventricle has no regional wall motion abnormalities. The left ventricular internal cavity size was normal in  size. There is  moderate concentric left ventricular hypertrophy. Abnormal (paradoxical) septal motion consistent with post-operative status. Left ventricular diastolic parameters are consistent with Grade I diastolic dysfunction (impaired relaxation). Right Ventricle: The right ventricular size is normal. No increase in right ventricular wall thickness. Right ventricular systolic function is normal. Tricuspid regurgitation signal is inadequate for assessing PA pressure. Left Atrium: Left atrial size was normal in size. Right Atrium: Right atrial size was normal in size. Pericardium: There is no evidence of pericardial effusion. Presence of epicardial fat layer. Mitral Valve: The mitral valve is grossly normal. Trivial mitral valve regurgitation. No evidence of mitral valve stenosis. Tricuspid Valve: The tricuspid valve is grossly normal. Tricuspid valve regurgitation is not demonstrated. No evidence of tricuspid stenosis. Aortic Valve: The aortic valve has been repaired/replaced. Aortic valve regurgitation is mild. Aortic valve mean gradient measures 6.7 mmHg. Aortic valve peak gradient measures 12.3 mmHg. Aortic valve area, by VTI measures 3.73 cm. There is a bioprosthetic valve present in the aortic position. Pulmonic Valve: The pulmonic valve was grossly normal. Pulmonic valve regurgitation is trivial. No evidence of pulmonic stenosis. Aorta: Aortic dilatation noted. There is mild dilatation of the aortic root, measuring 41 mm. Venous: The inferior vena cava was not well visualized. IAS/Shunts: The atrial septum is grossly normal. Agitated saline contrast was given intravenously to evaluate for intracardiac shunting.  LEFT VENTRICLE PLAX 2D LVIDd:         5.30 cm   Diastology LVIDs:         3.60 cm   LV e' medial:    4.26 cm/s LV PW:         1.50 cm   LV E/e' medial:  9.4 LV IVS:        1.50 cm   LV e' lateral:   4.40 cm/s LVOT diam:     2.87 cm   LV E/e' lateral: 9.1 LV SV:         143 LV SV Index:   62 LVOT  Area:     6.45 cm  RIGHT VENTRICLE            IVC RV S prime:     4.26 cm/s  IVC diam: 1.50 cm LEFT ATRIUM             Index LA diam:        4.40 cm 1.91 cm/m LA Vol (A2C):   46.7 ml 20.27 ml/m LA Vol (A4C):   50.8 ml 22.05 ml/m LA Biplane Vol: 50.1 ml 21.75 ml/m  AORTIC VALVE                     PULMONIC VALVE AV Area (Vmax):    3.94  cm      PV Vmax:       0.66 m/s AV Area (Vmean):   3.72 cm      PV Peak grad:  1.7 mmHg AV Area (VTI):     3.73 cm AV Vmax:           175.67 cm/s AV Vmean:          118.667 cm/s AV VTI:            0.382 m AV Peak Grad:      12.3 mmHg AV Mean Grad:      6.7 mmHg LVOT Vmax:         107.27 cm/s LVOT Vmean:        68.333 cm/s LVOT VTI:          0.221 m LVOT/AV VTI ratio: 0.58  AORTA Ao Root diam: 3.70 cm Ao Asc diam:  3.20 cm MITRAL VALVE MV Area (PHT): 2.68 cm    SHUNTS MV Decel Time: 284 msec    Systemic VTI:  0.22 m MV E velocity: 39.95 cm/s  Systemic Diam: 2.87 cm MV A velocity: 56.00 cm/s MV E/A ratio:  0.71 Lennie Odor MD Electronically signed by Lennie Odor MD Signature Date/Time: 01/25/2023/5:36:34 PM    Final    MR ANGIO HEAD WO CONTRAST  Result Date: 01/24/2023 CLINICAL DATA:  Follow-up examination for stroke. EXAM: MRA HEAD WITHOUT CONTRAST TECHNIQUE: Angiographic images of the Circle of Willis were acquired using MRA technique without intravenous contrast. COMPARISON:  Prior MRI from earlier the same day. FINDINGS: Anterior circulation: Both internal carotid arteries are patent through the siphons without stenosis or other abnormality. Right A1 segment dominant and widely patent. Left A1 is hypoplastic and grossly patent on time-of-flight sequence. Tiny 2 mm outpouching extending from the over surface of the anterior communicating artery complex favored to reflect a small vascular infundibulum related to a hypoplastic left A1 segment. Anterior communicating artery complex otherwise unremarkable. Both ACAs patent without proximal stenosis. No M1 stenosis or  occlusion. No proximal MCA branch occlusion or high-grade stenosis. Distal MCA branches perfused and symmetric. Posterior circulation: Left vertebral artery strongly dominant and widely patent. Left PICA patent. Visualized right vertebral artery is hypoplastic and largely terminates in PICA. Right PICA patent as well. Basilar widely patent without stenosis. Superior cerebral arteries patent bilaterally. Both PCAs primarily supplied via the basilar. PCAs patent distal aspects without proximal stenosis. Anatomic variants: Hypoplastic right vertebral artery terminates in PICA. Hypoplastic left A1 segment. Other: No intracranial aneurysm. IMPRESSION: 1. Negative intracranial MRA for large vessel occlusion or other emergent finding. No hemodynamically significant or correctable stenosis. 2. 2 mm outpouching extending inferiorly from the anterior communicating artery complex, favored to reflect a small vascular infundibulum related to a hypoplastic left A1 segment. Small aneurysm disc felt to exclude. Attention at follow-up recommended. Electronically Signed   By: Rise Mu M.D.   On: 01/24/2023 19:42   MR BRAIN WO CONTRAST  Result Date: 01/24/2023 CLINICAL DATA:  Neuro deficit with acute stroke suspected EXAM: MRI HEAD WITHOUT CONTRAST TECHNIQUE: Multiplanar, multiecho pulse sequences of the brain and surrounding structures were obtained without intravenous contrast. COMPARISON:  Head CT from earlier today FINDINGS: Brain: Small acute infarct in the high and left perirolandic cortex. Diffusion alteration along the superior left frontal cortex attributed to susceptibility artifact when correlated with gradient imaging. Advanced chronic small vessel ischemic gliosis in the cerebral white matter with chronic lacunar infarcts in the deep gray nuclei and deep  white matter bilaterally. Chronic cortically based infarcts along the left more than right occipital cortex and in the inferior left temporal occipital  cortex- site highlighted on prior head CT. Small chronic bilateral cerebellar infarcts. Brain volume is normal for age. 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 No acute hemorrhage, hydrocephalus, or collection. Vascular: Normal flow voids. Skull and upper cervical spine: Normal marrow signal. Sinuses/Orbits: No acute finding IMPRESSION: 1. Small acute infarct in the superior left perirolandic cortex. 2. Widespread chronic ischemic injury as described. Electronically Signed   By: Tiburcio Pea M.D.   On: 01/24/2023 14:20   CT HEAD WO CONTRAST  Result Date: 01/24/2023 CLINICAL DATA:  Right lower extremity weakness/numbness. EXAM: CT HEAD WITHOUT CONTRAST TECHNIQUE: Contiguous axial images were obtained from the base of the skull through the vertex without intravenous contrast. RADIATION DOSE REDUCTION: This exam was performed according to the departmental dose-optimization program which includes automated exposure control, adjustment of the mA and/or kV according to patient size and/or use of iterative reconstruction technique. COMPARISON:  CT head 06/06/2022 FINDINGS: Brain: There is no evidence of acute intracranial hemorrhage or extra-axial fluid collection. There is hypodensity with loss of gray-white differentiation along the undersurface of the left temporal occipital region which is new since the prior study (6-49, 5-44). Finding is consistent with an interval infarct, possibly subacute Numerous additional remote infarcts in the bilateral occipital lobes, right frontal cortex, hemispheric white matter, bilateral cerebellar hemispheres, and left basal ganglia are unchanged. Parenchymal volume is stable. The ventricles are stable in size. The pituitary and suprasellar region are normal. There is no mass lesion. There is no mass effect or midline shift. Vascular: There is calcification of the bilateral carotid siphons and vertebral  arteries. Skull: Normal. Negative for fracture or focal lesion. Sinuses/Orbits: The paranasal sinuses are clear. Bilateral lens implants are in place. The globes and orbits are otherwise unremarkable. Other: The mastoid air cells and middle ear cavities are clear. IMPRESSION: 1. New hypodensity with loss of gray-white differentiation along the undersurface of the left temporal occipital region consistent with interval infarct, possibly subacute. Consider brain MRI for characterization. 2. No acute intracranial hemorrhage. Electronically Signed   By: Lesia Hausen M.D.   On: 01/24/2023 11:42        Scheduled Meds:  amiodarone  200 mg Oral Daily   apixaban  10 mg Oral BID   Followed by   Melene Muller ON 02/01/2023] apixaban  5 mg Oral BID   aspirin EC  81 mg Oral Daily   atorvastatin  80 mg Oral Daily   ezetimibe  10 mg Oral Daily   finasteride  5 mg Oral Daily   insulin aspart  0-6 Units Subcutaneous TID WC   insulin detemir  10 Units Subcutaneous QHS   metoprolol succinate  50 mg Oral Daily   Continuous Infusions:   LOS: 1 day    Time spent: 52 minutes spent on chart review, discussion with nursing staff, consultants, updating family and interview/physical exam; more than 50% of that time was spent in counseling and/or coordination of care.    Alvira Philips [place], DO [facility] Available via Epic secure chat 7am-7pm After these hours, please refer to coverage provider listed on [email or web address] 01/26/2023, 10:09 AM

## 2023-01-26 NOTE — Evaluation (Signed)
Speech Language Pathology Evaluation Patient Details Name: Shane Holloway MRN: [id number] DOB: [birth year] Today's Date: 01/26/2023 Time: 7829-5621 SLP Time Calculation (min) (ACUTE ONLY): 17 min  Problem List:  Patient Active Problem List   Diagnosis Date Noted   Acute CVA (cerebrovascular accident) (HCC) 01/24/2023   HFrEF (heart failure with reduced ejection fraction) (HCC) 02/28/2022   B12 deficiency anemia 12/19/2021   Thrombocytopenia (HCC) 12/16/2021   Obesity (BMI 30-39.9) 12/11/2021   Chronic kidney disease, stage 3b (HCC) 12/10/2021   SVT (supraventricular tachycardia) 03/20/2021   S/P aortic valve replacement with bioprosthetic valve 02/22/2021   Vascular dementia without behavioral disturbance (HCC) 09/13/2020   History of stroke 02/22/2014   Benign prostatic hyperplasia 04/16/2012   Uncontrolled type 2 diabetes mellitus with hyperglycemia, without long-term current use of insulin (HCC) 06/27/2009   CORONARY ATHEROSCLEROSIS, ARTERY BYPASS GRAFT 06/27/2009   Essential hypertension 11/24/2008   Past Medical History:  Past Medical History:  Diagnosis Date   Acute on chronic combined systolic and diastolic CHF (congestive heart failure) (HCC) 11/24/2008   Qualifier: Diagnosis of   By: Johnnette Barrios, CNA, Sandy         Acute renal failure with acute tubular necrosis superimposed on stage 3b chronic kidney disease (HCC) 12/15/2021   Allergy    Aortic stenosis    Arthritis    hands   BPH (benign prostatic hyperplasia)    CHF (congestive heart failure) (HCC)    CKD (chronic kidney disease)    COVID-19    COVID-19 virus infection 12/16/2021   Diabetes mellitus    Dyspnea    DYSPNEA 11/24/2008   Qualifier: Diagnosis of   By: Johnnette Barrios, CNA, Sandy         Hyperlipidemia    Hypertension    Hypertensive urgency 12/10/2021   Myocardial infarction [facility]) 06/10/2002   NSTEMI (non-ST elevated myocardial infarction) (HCC) 12/10/2021   Stroke (HCC) 09/2020   Some memory deficits    Vertigo    Wears dentures    full upper   Past Surgical History:  Past Surgical History:  Procedure Laterality Date   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  Redo of 2V CABG, Aortic Root repair and AVR   CATARACT EXTRACTION W/PHACO Right 11/19/2021   Procedure: CATARACT EXTRACTION PHACO AND INTRAOCULAR LENS PLACEMENT (IOC) RIGHT DIABETIC;  Surgeon: Nevada Crane, MD;  Location: [facility];  Service: Ophthalmology;  Laterality: Right;  7.32 0:54.3   CATARACT EXTRACTION W/PHACO Left 12/03/2021   Procedure: CATARACT EXTRACTION PHACO AND INTRAOCULAR LENS PLACEMENT (IOC) LEFT DIABETIC;  Surgeon: Estanislado Pandy, MD;  Location: [facility];  Service: Ophthalmology;  Laterality: Left;  LEAVE FIRST CASE Diabetic 3.74 00:36.3   CORONARY ARTERY BYPASS GRAFT  2009   x4; stents to diagonal a mid right coronary lesion; drug eluting stent in the mid RCA as well as ostium of the diagonal with a cutting balloon angioplasty of the diagonal.   CORONARY STENT INTERVENTION N/A 12/14/2021   Procedure: CORONARY STENT INTERVENTION;  Surgeon: Iran Ouch, MD;  Location: [facility] INVASIVE CV LAB;  Service: Cardiovascular;  Laterality: N/A;   CORONARY ULTRASOUND/IVUS N/A 12/14/2021   Procedure: Intravascular Ultrasound/IVUS;  Surgeon: Iran Ouch, MD;  Location: [facility] INV[facility] CV [facility];  Service: Cardiovascular;  Laterality: N/A;   JOINT REPLACEMENT     RIGHT HEART CATH AND CORONARY/GRAFT ANGIOGRAPHY N/A 12/12/2021   Procedure: RIGHT HEART CATH AND CORONARY/GRAFT ANGIOGRAPHY;  Surgeon: Yvonne Kendall, MD;  Location: [facility]  INVASIVE CV LAB;  Service: Cardiovascular;  Laterality: N/A;   HPI:  Shane Holloway is a 72 yo male presenting to ED 8/16 with RLE weakness and numbness. MRI Brain with small acute infarct in superior L perirolandic cortex and widespread chronic ischemic injury. PMH includes prior CVA, dementia, HTN, CKD 3B, CAD s/p CABG,  DM, s/p aortic valve replacement, chronic combined CHF, obesity, BPH   Assessment / Plan / Recommendation Clinical Impression  Pt reports living alone with minimal assistance, although states that a family member assists him with finances and medication management. He reports increased difficulty with cognition that he feels is progressively declining, which he attributes to dementia. Pt disoriented to time, stating that the year is 2002, although is oriented to self, place, and situation. He scored a 14/30 on the SLUMS (a score of 27 or above is considered WFL) characterized by difficulty with memory (storage > retrieval), attention, problem solving, and divergent naming. After completing the evaluation, he reports subjectively feeling that it was much more difficult than baseline. Suspect pt is experiencing acute on chronic deficits with cognition and will likely benefit from increased supervision and ongoing SLP f/u to target compensatory strategies. Will continue to follow.     SLP Assessment  SLP Recommendation/Assessment: Patient needs continued Speech Lanaguage Pathology Services SLP Visit Diagnosis: Cognitive communication deficit (R41.841)    Recommendations for follow up therapy are one component of a multi-disciplinary discharge planning process, led by the attending physician.  Recommendations may be updated based on patient status, additional functional criteria and insurance authorization.    Follow Up Recommendations  Skilled nursing-short term rehab (<3 hours/day)    Assistance Recommended at Discharge  Frequent or constant Supervision/Assistance  Functional Status Assessment Patient has had a recent decline in their functional status and demonstrates the ability to make significant improvements in function in a reasonable and predictable amount of time.  Frequency and Duration min 2x/week  1 week      SLP Evaluation Cognition  Overall Cognitive Status: No family/caregiver  present to determine baseline cognitive functioning Arousal/Alertness: Awake/alert Orientation Level: Oriented to person;Oriented to place;Oriented to situation;Disoriented to time Year: Other (Comment) (2002) Day of Week: Correct Attention: Sustained Sustained Attention: Impaired Sustained Attention Impairment: Verbal basic Memory: Impaired Memory Impairment: Storage deficit Awareness: Impaired Awareness Impairment: Emergent impairment Problem Solving: Impaired Problem Solving Impairment: Verbal basic       Comprehension  Auditory Comprehension Overall Auditory Comprehension: Appears within functional limits for tasks assessed    Expression Expression Primary Mode of Expression: Verbal Verbal Expression Overall Verbal Expression: Impaired Naming: Impairment Divergent: 0-24% accurate Written Expression Dominant Hand: Right   Oral / Motor  Oral Motor/Sensory Function Overall Oral Motor/Sensory Function: Within functional limits Motor Speech Overall Motor Speech: Appears within functional limits for tasks assessed            Gwynneth Aliment, M.A., CF-SLP Speech Language Pathology, Acute Rehabilitation Services  Secure Chat preferred [phone]  01/26/2023, 9:44 AM

## 2023-01-27 DIAGNOSIS — I639 Cerebral infarction, unspecified: Secondary | ICD-10-CM | POA: Diagnosis not present

## 2023-01-27 LAB — BASIC METABOLIC PANEL
Anion gap: 9 (ref 5–15)
BUN: 37 mg/dL — ABNORMAL HIGH (ref 8–23)
CO2: 21 mmol/L — ABNORMAL LOW (ref 22–32)
Calcium: 8.8 mg/dL — ABNORMAL LOW (ref 8.9–10.3)
Chloride: 106 mmol/L (ref 98–111)
Creatinine, Ser: 2.96 mg/dL — ABNORMAL HIGH (ref 0.61–1.24)
GFR, Estimated: 21 mL/min — ABNORMAL LOW (ref >60)
Glucose, Bld: 147 mg/dL — ABNORMAL HIGH (ref 70–99)
Potassium: 3.3 mmol/L — ABNORMAL LOW (ref 3.5–5.1)
Sodium: 136 mmol/L (ref 135–145)

## 2023-01-27 LAB — GLUCOSE, CAPILLARY
Glucose-Capillary: 128 mg/dL — ABNORMAL HIGH (ref 70–99)
Glucose-Capillary: 166 mg/dL — ABNORMAL HIGH (ref 70–99)

## 2023-01-27 MED ORDER — INSULIN DETEMIR 100 UNIT/ML ~~LOC~~ SOLN: 12.0000 [IU] | Freq: Every day | SUBCUTANEOUS | Status: DC

## 2023-01-27 MED ORDER — SACUBITRIL-VALSARTAN 49-51 MG PO TABS
1.0000 | ORAL_TABLET | Freq: Two times a day (BID) | ORAL | Status: DC
  Administered 2023-01-27: 1 via ORAL
  Filled 2023-01-27 (×2): qty 1

## 2023-01-27 MED ORDER — APIXABAN 5 MG PO TABS: ORAL_TABLET | ORAL | 0 refills | Status: DC

## 2023-01-27 MED ORDER — POTASSIUM CHLORIDE CRYS ER 20 MEQ PO TBCR
30.0000 meq | EXTENDED_RELEASE_TABLET | ORAL | Status: AC
  Administered 2023-01-27 (×2): 30 meq via ORAL
  Filled 2023-01-27 (×2): qty 1

## 2023-01-27 NOTE — Progress Notes (Signed)
PROGRESS NOTE    Shane Holloway  VWU:[id number] DOB: 04-12-1951 DOA: 01/24/2023 PCP: Kurtis Bushman, MD    Brief Narrative:   Shane Holloway is a 72 y.o. male with past medical history significant for HTN, HLD, chronic systolic/diastolic congestive heart failure, CKD stage IV, CAD s/p CABG, DM2, history of SVT, history of DVT on Eliquis, history of CVA, history of aortic valve repair, obesity who presented to [facility] ED on 8/16 with complaints of right lower extremity weakness, numbness in which she noted at 9:30 AM on 8/16.  Last known normal 8:30 PM on 8/15.  Patient reports takes Eliquis, but has been on hold due to bleeding after dental procedure earlier in the week.  Unsure how many days he has been holding this but has been Shane Holloway a few days.  Denies fever/chills, no chest pain, no shortness of breath, abdominal pain, no nausea/vomiting/diarrhea.  In the ED, temperature 98.6 F, HR 65, RR 16, BP 130/83, SpO2 95% on room air.  WBC 5.4, hemoglobin 12.2, platelets 121.  Sodium 144, potassium 4.3, chloride 107, CO2 23, glucose 191, BUN 33, creatinine 3.25.  AST 20, ALT 28, total bilirubin 0.6.  INR 1.0.  Urinalysis small leukocytes, negative nitrite, rare bacteria, greater than 50 WBCs.  UDS negative.  EtOH level less than 10.  CT head without contrast with new hypodensity loss of gray/white differentiation along the undersurface of the left temporal occipital region consistent with interval infarct, no hemorrhage.  MRI brain without contrast with small acute infarct superior left perirolandic cortex, widespread chronic ischemic injury.  MRA head negative for large vessel occlusion.  Neurology was consulted.  TRH consulted for admission for further evaluation management of acute CVA.  Assessment & Plan:   Acute CVA Patient presenting to ED with right lower extremity weakness associated with paresthesias.  Last known normal 8:30 PM on 8/15.  CT head without contrast with  abnormality left temporal/occipital region, no hemorrhage.  MRI brain with acute infarct superior left perirolandic cortex.  MRA head negative for large vessel occlusion.  Hemoglobin A1c 8.8, LDL 29. TTE: LVEF 55-60%, G1DD, 41 mm aortic root dilation, no intra or cardiac source of embolism.  Carotid ultrasound with 1-39% stenosis bilateral ICA with incidental finding of acute DVT left internal jugular vein, bilateral vertebral arteries antegrade flow.  MRV with no dural venous thrombosis. -- Neurology following, appreciate assistance -- Continue to hold antihypertensives with goal systolic <220 and diastolic <120 -- Carotid ultrasound:  -- PT/OT/SLP evaluation: Recommend SNF -- Aspirin 81 mg p.o. daily -- Atorvastatin 80 mg p.o. daily -- Eliquis 10 mg p.o. twice daily x 7 days; followed by 5mg  PO BID -- Continue monitor on telemetry  CKD stage IV Baseline creatinine 2.8-2.9; creatinine on admission 3.25, stable -- Cr 3.25>3.40>2.66>3.04>2.96 -- Avoid nephrotoxins, renally dose all medications -- BMP daily  Hypokalemia Repleted, potassium 3.3 this morning.  Will replete. -- Repeat electrolytes in a.m.   Essential hypertension Chronic systolic/diastolic congestive heart failure Hx SVT Most recent TEE 2023 with LVEF 35-40%, grade 1 diastolic dysfunction.  Home regimen includes amlodipine 2.5 mg p.o. daily, Entresto 49-51 mg p.o. twice daily, metoprolol succinate 50 mg p.o. daily, amiodarone 200 mg p.o. daily. --Repeat TTE: With improved LVEF 55-60%, G1DD -- Metoprolol succinate 50 mg p.o. daily -- Amiodarone 200 mg p.o. daily --Restart home Entresto 49-51 mg p.o. twice daily today -- Hold home amlodipine for now -- Monitor on telemetry -- Strict I's and O's and daily weights  Hx aortic valve replacement Bioprosthetic aortic valve replacement 2015.  HLD Lipid panel with total cholesterol 90, HDL 23, LDL 29, triglycerides 188. -- Zetia 10 mg p.o. daily -- Atorvastatin 80 mg p.o.  daily  Acute left IJ DVT Hx DVT Started on Eliquis June 2022 for L IJ and subclavian DVT.  Seen by hematology 03/2021 and given unprovoked VTE and concern for embolic strokes, favor long-term anticoagulation.  Insil finding of acute DVT left IJ on carotid ultrasound. --Continue Eliquis 10 mg p.o. twice daily x 7 days followed by 5mg  PO BID  Type 2 diabetes mellitus, with hyperglycemia Hemoglobin A1c 8.8, not optimally controlled.  Home regimen includes NPH 12 units [place] daily with breakfast, Jardiance, semaglutide. -- Levemir 12units [place] daily -- SSI for coverage -- CBGs qAC/HS  BPH -- Finasteride 5 mg p.o. daily  Dementia, mild --Delirium precautions --Get up during the day --Encourage a familiar face to remain present throughout the day --Keep blinds open and lights on during daylight hours --Minimize the use of opioids/benzodiazepines  Obesity Complicates all facets of care   DVT prophylaxis:  apixaban (ELIQUIS) tablet 10 mg  apixaban (ELIQUIS) tablet 5 mg    Code Status: Full Code Family Communication: No family present at bedside this morning; updated niece via telephone yesterday afternoon  Disposition Plan:  Level of care: Telemetry Medical Status is: Inpatient Remains inpatient appropriate because: Pending SNF placement, medically stable for discharge once bed available      Consultants:  Neurology  Procedures:  TTE Carotid ultrasound  Antimicrobials:  None   Subjective: Patient seen examined bedside, resting comfortably.  Lying in bed.  No questions, concerns or complaints this morning.  Denies headache, no dizziness, no visual changes, no chest pain, no palpitations, no shortness of breath, no abdominal pain, no fever/chills/night sweats, no nausea/vomiting/diarrhea, no fatigue.  No acute events overnight per nurse staff.  Stable for discharge to SNF once bed available, insurance authorization received.  Objective: Vitals:   01/26/23 1532 01/26/23 2100  01/27/23 0500 01/27/23 0754  BP: 138/81 (!) 160/95 136/85 (!) 154/87  Pulse: 68 76 68 67  Resp: 18 19 19 18   Temp: 98.6 F (37 C) 98.5 F (36.9 C) 99.4 F (37.4 C) 98.4 F (36.9 C)  TempSrc: Oral Oral Oral Oral  SpO2: 94%  97% 97%  Weight:   99.7 kg   Height:        Intake/Output Summary (Last 24 hours) at 01/27/2023 0941 Last data filed at 01/27/2023 0100 Gross per 24 hour  Intake --  Output 850 ml  Net -850 ml   Filed Weights   01/27/23 0500  Weight: 99.7 kg    Examination:  Physical Exam: GEN: NAD, alert and oriented to person/place (hospital/[place]), but not time (no year given), or person (President" no answer given), chronically ill in appearance, appears older than stated age HEENT: NCAT, PERRL, EOMI, sclera clear, poor dentition, dry mucous membranes PULM: CTAB w/o wheezes/crackles, normal respiratory effort, on room air CV: RRR w/o M/G/R GI: abd soft, NTND, NABS, no R/G/M MSK: no peripheral edema, muscle strength decreased 4/5 right lower extremity  PSYCH: normal mood/affect Integumentary: dry/intact, no rashes or wounds  Neuro Exam Mental Status: A&O to self/place but not time or person, no dysarthria, no aphasia  Cranial Nerves: visual fields full, PERRL, EOMi, intact smooth pursuit, no nystagmus, no ptosis, facial sensation intact bilaterally, 5/5 jaw strength, nasolabial fold & smile symetric,  eyebrow  raise & 5/5 eye closure symetric, hearing symmetric and  normal to rubbing fingers, palate elevates symmetrically, head turning and shoulder shrug intact and symetric bilaterally, tongue protrusion is midline Motor: no pronator drift, LUE 5/5,   LLE 5/5,   RUE 5/5,   RLE 4/5   Sensory: Sensation decreased to light touch anterior right shin Coordination/Movement: no tremor noted, no dysmetria   Data Reviewed: I have personally reviewed following labs and imaging studies  CBC: Recent Labs  Lab 01/24/23 1040 01/24/23 1055 01/25/23 0301  WBC 5.4  --  5.5   NEUTROABS 3.6  --   --   HGB 12.2* 11.6* 11.7*  HCT 36.2* 34.0* 34.8*  MCV 89.6  --  85.7  PLT 121*  --  107*   Basic Metabolic Panel: Recent Labs  Lab 01/24/23 1040 01/24/23 1055 01/25/23 0301 01/26/23 0401 01/27/23 0430  NA 144 143 141 138 136  K 4.3 4.2 3.4* 3.7 3.3*  CL 107 108 108 106 106  CO2 23  --  22 21* 21*  GLUCOSE 191* 180* 157* 168* 147*  BUN 33* 31* 29* 34* 37*  CREATININE 3.25* 3.40* 2.66* 3.04* 2.96*  CALCIUM 9.2  --  8.9 8.8* 8.8*  MG  --   --   --  2.0  --    GFR: Estimated Creatinine Clearance: 27.4 mL/min (A) (by C-G formula based on SCr of 2.96 mg/dL (H)). Liver Function Tests: Recent Labs  Lab 01/24/23 1040 01/25/23 0301  AST 20 19  ALT 28 23  ALKPHOS 83 72  BILITOT 0.6 0.6  PROT 5.7* 5.5*  ALBUMIN 3.4* 3.1*   No results for input(s): "LIPASE", "AMYLASE" in the last 168 hours. No results for input(s): "AMMONIA" in the last 168 hours. Coagulation Profile: Recent Labs  Lab 01/24/23 1040  INR 1.0   Cardiac Enzymes: No results for input(s): "CKTOTAL", "CKMB", "CKMBINDEX", "TROPONINI" in the last 168 hours. BNP (last 3 results) No results for input(s): "PROBNP" in the last 8760 hours. HbA1C: Recent Labs    01/25/23 0301  HGBA1C 8.8*   CBG: Recent Labs  Lab 01/26/23 0557 01/26/23 1209 01/26/23 1648 01/26/23 2114 01/27/23 0611  GLUCAP 162* 173* 128* 159* 128*   Lipid Profile: Recent Labs    01/25/23 0301  CHOL 90  HDL 23*  LDLCALC 29  TRIG 191*  CHOLHDL 3.9   Thyroid Function Tests: No results for input(s): "TSH", "T4TOTAL", "FREET4", "T3FREE", "THYROIDAB" in the last 72 hours. Anemia Panel: No results for input(s): "VITAMINB12", "FOLATE", "FERRITIN", "TIBC", "IRON", "RETICCTPCT" in the last 72 hours. Sepsis Labs: No results for input(s): "PROCALCITON", "LATICACIDVEN" in the last 168 hours.  No results found for this or any previous visit (from the past 240 hour(s)).       Radiology Studies: MR MRV HEAD WO  CM  Result Date: 01/26/2023 CLINICAL DATA:  Initial evaluation for possible dural venous sinus thrombosis. EXAM: MR VENOGRAM HEAD WITHOUT CONTRAST TECHNIQUE: Angiographic images of the intracranial venous structures were acquired using MRV technique without intravenous contrast. COMPARISON:  Prior MRI from 01/24/2023. FINDINGS: Normal flow related signal seen throughout the superior sagittal sinus to the torcula. Transverse and sigmoid sinuses are patent as are the jugular bulbs and visualized proximal internal jugular veins. Right transverse sinus dominant. Straight sinus, vein of Galen, internal cerebral veins, and basal veins of Rosenthal are patent. No evidence for dural venous sinus thrombosis. No appreciable cortical vein abnormality. No appreciable abnormality about the cavernous sinus. IMPRESSION: Normal intracranial MRV. No evidence for dural venous sinus thrombosis. Electronically Signed  By: Rise Mu M.D.   On: 01/26/2023 02:13   VAS US CAROTID  Result Date: 01/25/2023 Carotid Arterial Duplex Study Patient Name:  Shane Holloway  Date of Exam:   01/25/2023 Medical Rec #: [id number]          Accession #:    [id number] Date of Birth: 1950-07-19           Patient Gender: M Patient Age:   28 years Exam Location:  [facility] Procedure:      VAS US CAROTID Referring Phys: Dewitt Hoes DE LA TORRE --------------------------------------------------------------------------------  Indications:       CVA, Numbness, Weakness and Patient on Eliquis for history of                    DVT. However, Eliquis was held several days post dental                    procedure. Risk Factors:      Hypertension, hyperlipidemia, Diabetes, prior MI, prior CVA X                    3. Other Factors:     Bioprosthetic aortic valve. Comparison Study:  No prior study on file Performing Technologist: Sherren Kerns RVS  Examination Guidelines: A complete evaluation includes B-mode imaging, spectral Doppler, color  Doppler, and power Doppler as needed of all accessible portions of each vessel. Bilateral testing is considered an integral part of a complete examination. Limited examinations for reoccurring indications may be performed as noted.  Right Carotid Findings: +----------+--------+--------+--------+------------------+------------------+           PSV cm/sEDV cm/sStenosisPlaque DescriptionComments           +----------+--------+--------+--------+------------------+------------------+ CCA Prox  37      9                                 intimal thickening +----------+--------+--------+--------+------------------+------------------+ CCA Distal45      10              heterogenous                         +----------+--------+--------+--------+------------------+------------------+ ICA Prox  48      11              heterogenous                         +----------+--------+--------+--------+------------------+------------------+ ICA Mid   48      15                                                   +----------+--------+--------+--------+------------------+------------------+ ICA Distal40      11                                tortuous           +----------+--------+--------+--------+------------------+------------------+ ECA       43      7                                                    +----------+--------+--------+--------+------------------+------------------+ +----------+--------+-------+--------+-------------------+  PSV cm/sEDV cmsDescribeArm Pressure (mmHG) +----------+--------+-------+--------+-------------------+ Subclavian100                                        +----------+--------+-------+--------+-------------------+ +---------+--------+--+--------+-+ VertebralPSV cm/s25EDV cm/s7 +---------+--------+--+--------+-+  Left Carotid Findings: +----------+--------+--------+--------+------------------+--------+           PSV cm/sEDV  cm/sStenosisPlaque DescriptionComments +----------+--------+--------+--------+------------------+--------+ CCA Prox  50      5               heterogenous               +----------+--------+--------+--------+------------------+--------+ CCA Distal52      13              heterogenous               +----------+--------+--------+--------+------------------+--------+ ICA Prox  76      16              calcific                   +----------+--------+--------+--------+------------------+--------+ ICA Mid   53      15                                         +----------+--------+--------+--------+------------------+--------+ ICA Distal51      15                                         +----------+--------+--------+--------+------------------+--------+ ECA       87      2               calcific                   +----------+--------+--------+--------+------------------+--------+ +----------+--------+--------+--------+-------------------+           PSV cm/sEDV cm/sDescribeArm Pressure (mmHG) +----------+--------+--------+--------+-------------------+ [id number]                                          +----------+--------+--------+--------+-------------------+ +---------+--------+--+--------+-+ VertebralPSV cm/s33EDV cm/s9 +---------+--------+--+--------+-+ Incidental DVT noted in the Internal Jugular Vein  Summary: Right Carotid: Velocities in the right ICA are consistent with a 1-39% stenosis. Left Carotid: Velocities in the left ICA are consistent with a 1-39% stenosis.               Incidental finding of acute DVT in the left internal jugular vein.               Right internal jugular vein and bilateral subclavian veins appear               patent. Vertebrals:  Bilateral vertebral arteries demonstrate antegrade flow. Subclavians: Normal flow hemodynamics were seen in bilateral subclavian              arteries. *See table(s) above for measurements and  observations.  Electronically signed by Marvel Plan MD on 01/25/2023 at 6:30:41 PM.    Final    VAS [place] TRANSCRANIAL DOPPLER W BUBBLES  Result Date: 01/25/2023  Transcranial Doppler with Bubble Patient Name:  Shane Holloway  Date of Exam:   01/25/2023 Medical Rec #: [id number]  Accession #:    [id number] Date of Birth: 1951-03-31           Patient Gender: M Patient Age:   13 years Exam Location:  [facility] Procedure:      VAS [place] TRANSCRANIAL DOPPLER W BUBBLES Referring Phys: Scheryl Marten XU --------------------------------------------------------------------------------  Indications: Acute IJV DVT and stroke. Limitations for diagnostic windows: Unable to insonate right transtemporal window. Comparison Study: No prior study Performing Technologist: Sherren Kerns RVS  Examination Guidelines: A complete evaluation includes B-mode imaging, spectral Doppler, color Doppler, and power Doppler as needed of all accessible portions of each vessel. Bilateral testing is considered an integral part of a complete examination. Limited examinations for reoccurring indications may be performed as noted.  Summary: No HITS at rest or during Valsalva. Negative transcranial Doppler Bubble study with no evidence of right to left intracardiac communication.  *See table(s) above for TCD measurements and observations.  Diagnosing physician: Marvel Plan MD Electronically signed by Marvel Plan MD on 01/25/2023 at 6:28:25 PM.    Final    ECHOCARDIOGRAM COMPLETE BUBBLE STUDY  Result Date: 01/25/2023    ECHOCARDIOGRAM REPORT   Patient Name:   Shane Holloway Date of Exam: 01/25/2023 Medical Rec #:  [id number]         Height:       73.5 in Accession #:    [id number]        Weight:       232.0 lb Date of Birth:  1950/06/22          BSA:          2.303 m Patient Age:    72 years          BP:           126/81 mmHg Patient Gender: M                 HR:           60 bpm. Exam Location:  Inpatient Procedure: 3D Echo, Cardiac Doppler,  Color Doppler and Saline Contrast Bubble            Study Indications:    Stroke I63.9  History:        Patient has prior history of Echocardiogram examinations, most                 recent 12/31/2021. Prior CABG, Stroke; Risk Factors:Diabetes,                 Hypertension and Former Smoker. S/P aortic valve replacement                 with bioprosthetic valve                 2022.                 Aortic Valve: bioprosthetic valve is present in the aortic                 position.  Sonographer:    Dondra Prader RVT RCS Referring Phys: [id number] Cecille Po MELVIN IMPRESSIONS  1. Left ventricular ejection fraction, by estimation, is 55 to 60%. The left ventricle has normal function. The left ventricle has no regional wall motion abnormalities. There is moderate concentric left ventricular hypertrophy. Left ventricular diastolic parameters are consistent with Grade I diastolic dysfunction (impaired relaxation).  2. Right ventricular systolic function is normal. The right ventricular size is normal. Tricuspid regurgitation signal is inadequate for assessing PA pressure.  3. The mitral valve is grossly normal. Trivial mitral valve regurgitation. No evidence of mitral stenosis.  4. The aortic valve has been repaired/replaced. Aortic valve regurgitation is mild. There is a bioprosthetic valve present in the aortic position.  5. Aortic dilatation noted. There is mild dilatation of the aortic root, measuring 41 mm. Comparison(s): Changes from prior study are noted. The left ventricular function has improved. 12/31/21- EF 35-40%. Conclusion(s)/Recommendation(s): No intracardiac source of embolism detected on this transthoracic study. Consider a transesophageal echocardiogram to exclude cardiac source of embolism if clinically indicated. FINDINGS  Left Ventricle: Left ventricular ejection fraction, by estimation, is 55 to 60%. The left ventricle has normal function. The left ventricle has no regional wall motion abnormalities. The  left ventricular internal cavity size was normal in size. There is  moderate concentric left ventricular hypertrophy. Abnormal (paradoxical) septal motion consistent with post-operative status. Left ventricular diastolic parameters are consistent with Grade I diastolic dysfunction (impaired relaxation). Right Ventricle: The right ventricular size is normal. No increase in right ventricular wall thickness. Right ventricular systolic function is normal. Tricuspid regurgitation signal is inadequate for assessing PA pressure. Left Atrium: Left atrial size was normal in size. Right Atrium: Right atrial size was normal in size. Pericardium: There is no evidence of pericardial effusion. Presence of epicardial fat layer. Mitral Valve: The mitral valve is grossly normal. Trivial mitral valve regurgitation. No evidence of mitral valve stenosis. Tricuspid Valve: The tricuspid valve is grossly normal. Tricuspid valve regurgitation is not demonstrated. No evidence of tricuspid stenosis. Aortic Valve: The aortic valve has been repaired/replaced. Aortic valve regurgitation is mild. Aortic valve mean gradient measures 6.7 mmHg. Aortic valve peak gradient measures 12.3 mmHg. Aortic valve area, by VTI measures 3.73 cm. There is a bioprosthetic valve present in the aortic position. Pulmonic Valve: The pulmonic valve was grossly normal. Pulmonic valve regurgitation is trivial. No evidence of pulmonic stenosis. Aorta: Aortic dilatation noted. There is mild dilatation of the aortic root, measuring 41 mm. Venous: The inferior vena cava was not well visualized. IAS/Shunts: The atrial septum is grossly normal. Agitated saline contrast was given intravenously to evaluate for intracardiac shunting.  LEFT VENTRICLE PLAX 2D LVIDd:         5.30 cm   Diastology LVIDs:         3.60 cm   LV e' medial:    4.26 cm/s LV PW:         1.50 cm   LV E/e' medial:  9.4 LV IVS:        1.50 cm   LV e' lateral:   4.40 cm/s LVOT diam:     2.87 cm   LV E/e'  lateral: 9.1 LV SV:         143 LV SV Index:   62 LVOT Area:     6.45 cm  RIGHT VENTRICLE            IVC RV S prime:     4.26 cm/s  IVC diam: 1.50 cm LEFT ATRIUM             Index LA diam:        4.40 cm 1.91 cm/m LA Vol (A2C):   46.7 ml 20.27 ml/m LA Vol (A4C):   50.8 ml 22.05 ml/m LA Biplane Vol: 50.1 ml 21.75 ml/m  AORTIC VALVE                     PULMONIC VALVE AV Area (Vmax):    3.94  cm      PV Vmax:       0.66 m/s AV Area (Vmean):   3.72 cm      PV Peak grad:  1.7 mmHg AV Area (VTI):     3.73 cm AV Vmax:           175.67 cm/s AV Vmean:          118.667 cm/s AV VTI:            0.382 m AV Peak Grad:      12.3 mmHg AV Mean Grad:      6.7 mmHg LVOT Vmax:         107.27 cm/s LVOT Vmean:        68.333 cm/s LVOT VTI:          0.221 m LVOT/AV VTI ratio: 0.58  AORTA Ao Root diam: 3.70 cm Ao Asc diam:  3.20 cm MITRAL VALVE MV Area (PHT): 2.68 cm    SHUNTS MV Decel Time: 284 msec    Systemic VTI:  0.22 m MV E velocity: 39.95 cm/s  Systemic Diam: 2.87 cm MV A velocity: 56.00 cm/s MV E/A ratio:  0.71 Lennie Odor MD Electronically signed by Lennie Odor MD Signature Date/Time: 01/25/2023/5:36:34 PM    Final         Scheduled Meds:  amiodarone  200 mg Oral Daily   apixaban  10 mg Oral BID   Followed by   Melene Muller ON 02/01/2023] apixaban  5 mg Oral BID   aspirin EC  81 mg Oral Daily   atorvastatin  80 mg Oral Daily   ezetimibe  10 mg Oral Daily   finasteride  5 mg Oral Daily   insulin aspart  0-6 Units Subcutaneous TID WC   insulin detemir  12 Units Subcutaneous QHS   metoprolol succinate  50 mg Oral Daily   potassium chloride  30 mEq Oral Q4H   Continuous Infusions:   LOS: 2 days    Time spent: 52 minutes spent on chart review, discussion with nursing staff, consultants, updating family and interview/physical exam; more than 50% of that time was spent in counseling and/or coordination of care.    Alvira Philips [place], DO [facility] Available via Epic secure chat 7am-7pm After these  hours, please refer to coverage provider listed on [email or web address] 01/27/2023, 9:41 AM

## 2023-01-27 NOTE — Discharge Summary (Signed)
Physician Discharge Summary  Shane Holloway VHQ:[id number] DOB: April 15, 1951 DOA: 01/24/2023  PCP: Kurtis Bushman, MD  Admit date: 01/24/2023 Discharge date: 01/27/2023  Admitted From: Home Disposition: SNF  Recommendations for Outpatient Follow-up:  Follow up with PCP in 1-2 weeks Follow-up with [facility] in 4 weeks for stroke follow-up Continue treatment dose Eliquis for acute left IJ DVT Please obtain BMP in one week to continue to monitor renal function   Discharge Condition: Stable CODE STATUS: Full code Diet recommendation: Heart healthy/consistent carbohydrate diet  History of present illness:  Shane Holloway is a 72 y.o. male with past medical history significant for HTN, HLD, chronic systolic/diastolic congestive heart failure, CKD stage IV, CAD s/p CABG, DM2, history of SVT, history of DVT on Eliquis, history of CVA, history of aortic valve repair, obesity who presented to [facility] ED on 8/16 with complaints of right lower extremity weakness, numbness in which she noted at 9:30 AM on 8/16.  Last known normal 8:30 PM on 8/15.  Patient reports takes Eliquis, but has been on hold due to bleeding after dental procedure earlier in the week.  Unsure how many days he has been holding this but has been Elisse a few days.  Denies fever/chills, no chest pain, no shortness of breath, abdominal pain, no nausea/vomiting/diarrhea.   In the ED, temperature 98.6 F, HR 65, RR 16, BP 130/83, SpO2 95% on room air.  WBC 5.4, hemoglobin 12.2, platelets 121.  Sodium 144, potassium 4.3, chloride 107, CO2 23, glucose 191, BUN 33, creatinine 3.25.  AST 20, ALT 28, total bilirubin 0.6.  INR 1.0.  Urinalysis small leukocytes, negative nitrite, rare bacteria, greater than 50 WBCs.  UDS negative.  EtOH level less than 10.  CT head without contrast with new hypodensity loss of gray/white differentiation along the undersurface of the left temporal occipital region  consistent with interval infarct, no hemorrhage.  MRI brain without contrast with small acute infarct superior left perirolandic cortex, widespread chronic ischemic injury.  MRA head negative for large vessel occlusion.  Neurology was consulted.  TRH consulted for admission for further evaluation management of acute CVA.  Hospital course:  Acute CVA Patient presenting to ED with right lower extremity weakness associated with paresthesias.  Last known normal 8:30 PM on 8/15.  CT head without contrast with abnormality left temporal/occipital region, no hemorrhage.  MRI brain with acute infarct superior left perirolandic cortex.  MRA head negative for large vessel occlusion.  Hemoglobin A1c 8.8, LDL 29. TTE: LVEF 55-60%, G1DD, 41 mm aortic root dilation, no intra or cardiac source of embolism.  Carotid ultrasound with 1-39% stenosis bilateral ICA with incidental finding of acute DVT left internal jugular vein, bilateral vertebral arteries antegrade flow.  MRV with no dural venous thrombosis.  Neurology was consulted and followed during hospital course.  Therapy recommending discharge to SNF.  Continue aspirin 81 mg p.o. daily, atorvastatin 80 mg p.o. daily, Eliquis.  Outpatient follow-up with neurology 4 weeks stroke clinic.   CKD stage IV Baseline creatinine 2.8-2.9; creatinine on admission 3.25, stable.  Creatinine on discharge 2.96.  Recommend repeat BMP 1 week.   Hypokalemia Repleted during hospitalization.  Repeat BMP 1 week.   Essential hypertension Chronic systolic/diastolic congestive heart failure Hx SVT Most recent TEE 2023 with LVEF 35-40%, grade 1 diastolic dysfunction.  Home regimen includes amlodipine 2.5 mg p.o. daily, Entresto 49-51 mg p.o. twice daily, metoprolol succinate 50 mg p.o. daily, amiodarone 200 mg p.o. daily. Repeat TTE: With  improved LVEF 55-60%, G1DD. Continue metoprolol succinate 50 mg p.o. daily, Amiodarone 200 mg p.o. daily, Entresto 49-51 mg p.o. twice daily.   Discontinued home amlodipine.  Outpatient follow-up PCP/cardiology.  Hx aortic valve replacement Bioprosthetic aortic valve replacement 2015.   HLD Lipid panel with total cholesterol 90, HDL 23, LDL 29, triglycerides 188. Zetia 10 mg p.o. daily, Atorvastatin 80 mg p.o. daily   Acute left IJ DVT Hx DVT Started on Eliquis June 2022 for L IJ and subclavian DVT.  Seen by hematology 03/2021 and given unprovoked VTE and concern for embolic strokes, favor long-term anticoagulation.  Incidental finding of acute DVT left IJ on carotid ultrasound. Continue Eliquis 10 mg p.o. twice daily x 7 days followed by 5mg  PO BID   Type 2 diabetes mellitus, with hyperglycemia Hemoglobin A1c 8.8, not optimally controlled.  Home regimen includes NPH 12 units [place] daily with breakfast, Jardiance, semaglutide.  Change NPH to Semglee 12 units obviously daily.  Continue to monitor glucose and adjust accordingly.   BPH Finasteride 5 mg p.o. daily, tamsulosin 0.4 mg p.o. daily   Dementia, mild Delirium precautions, Keep blinds open and lights on during daylight hours. Minimize the use of opioids/benzodiazepines   Obesity Complicates all facets of care  Discharge Diagnoses:  Principal Problem:   Acute CVA (cerebrovascular accident) [facility]) Active Problems:   Vascular dementia without behavioral disturbance (HCC)   Chronic kidney disease, stage 3b (HCC)   S/P aortic valve replacement with bioprosthetic valve   Uncontrolled type 2 diabetes mellitus with hyperglycemia, without long-term current use of insulin (HCC)   Benign prostatic hyperplasia   Obesity (BMI 30-39.9)   Essential hypertension   CORONARY ATHEROSCLEROSIS, ARTERY BYPASS GRAFT   History of stroke   HFrEF (heart failure with reduced ejection fraction) [facility])    Discharge Instructions  Discharge Instructions     Ambulatory referral to Neurology   Complete by: As directed    Follow up with stroke clinic NP at [facility] in about 4 weeks. Thanks.   Call  MD for:  difficulty breathing, headache or visual disturbances   Complete by: As directed    Call MD for:  extreme fatigue   Complete by: As directed    Call MD for:  persistant dizziness or light-headedness   Complete by: As directed    Call MD for:  persistant nausea and vomiting   Complete by: As directed    Call MD for:  severe uncontrolled pain   Complete by: As directed    Call MD for:  temperature >100.4   Complete by: As directed    Diet - low sodium heart healthy   Complete by: As directed    Increase activity slowly   Complete by: As directed       Allergies as of 01/27/2023       Reactions   Altace [ramipril] Cough   Shellfish Allergy Nausea And Vomiting   Sulfa Antibiotics Hives        Medication List     STOP taking these medications    amLODipine 2.5 MG tablet Commonly known as: NORVASC   HUMULIN N PEN [place]   ibuprofen 200 MG tablet Commonly known as: ADVIL       TAKE these medications    amiodarone 200 MG tablet Commonly known as: PACERONE Take 1 tablet by mouth daily.   apixaban 5 MG Tabs tablet Commonly known as: ELIQUIS Take 2 tablets (10 mg total) by mouth 2 (two) times daily for 6 days, THEN  1 tablet (5 mg total) 2 (two) times daily. Start taking on: January 27, 2023 What changed: See the new instructions.   aspirin EC 81 MG tablet Take 81 mg by mouth daily.   atorvastatin 80 MG tablet Commonly known as: LIPITOR Take 80 mg by mouth daily.   empagliflozin 10 MG Tabs tablet Commonly known as: JARDIANCE Take 1 tablet by mouth daily.   Entresto 49-51 MG Generic drug: sacubitril-valsartan Take 1 tablet by mouth 2 (two) times daily.   ezetimibe 10 MG tablet Commonly known as: ZETIA Take 10 mg by mouth daily.   finasteride 5 MG tablet Commonly known as: PROSCAR Take 5 mg by mouth daily.   insulin detemir 100 UNIT/ML injection Commonly known as: LEVEMIR Inject 0.12 mLs (12 Units total) into the skin at bedtime.   metoprolol  succinate 50 MG 24 hr tablet Commonly known as: TOPROL-XL Take 50 mg by mouth daily.   nitroGLYCERIN 0.4 MG SL tablet Commonly known as: NITROSTAT Place under the tongue.   Ozempic (1 MG/DOSE) 4 MG/3ML Sopn Generic drug: Semaglutide (1 MG/DOSE) Inject 1 mg into the skin once a week.   tamsulosin 0.4 MG Caps capsule Commonly known as: FLOMAX Take 0.4 mg by mouth daily.        Follow-up Information     [facility]. Schedule an appointment as soon as possible for a visit in 1 month(s).   Specialty: Neurology Why: stroke clinic Contact information: [address] [place] [address] [phone]        Kurtis Bushman, MD. Schedule an appointment as soon as possible for a visit in 1 week(s).   Specialty: Internal Medicine Contact information: [address] [place] [address] [phone]                Allergies  Allergen Reactions   Altace [Ramipril] Cough   Shellfish Allergy Nausea And Vomiting   Sulfa Antibiotics Hives    Consultations: Neurology   Procedures/Studies: MR MRV HEAD WO CM  Result Date: 01/26/2023 CLINICAL DATA:  Initial evaluation for possible dural venous sinus thrombosis. EXAM: MR VENOGRAM HEAD WITHOUT CONTRAST TECHNIQUE: Angiographic images of the intracranial venous structures were acquired using MRV technique without intravenous contrast. COMPARISON:  Prior MRI from 01/24/2023. FINDINGS: Normal flow related signal seen throughout the superior sagittal sinus to the torcula. Transverse and sigmoid sinuses are patent as are the jugular bulbs and visualized proximal internal jugular veins. Right transverse sinus dominant. Straight sinus, vein of Galen, internal cerebral veins, and basal veins of Rosenthal are patent. No evidence for dural venous sinus thrombosis. No appreciable cortical vein abnormality. No appreciable abnormality about the cavernous sinus.  IMPRESSION: Normal intracranial MRV. No evidence for dural venous sinus thrombosis. Electronically Signed   By: Rise Mu M.D.   On: 01/26/2023 02:13   VAS US CAROTID  Result Date: 01/25/2023 Carotid Arterial Duplex Study Patient Name:  DEVOHN KIDANE  Date of Exam:   01/25/2023 Medical Rec #: [id number]          Accession #:    [id number] Date of Birth: Jun 19, 1950           Patient Gender: M Patient Age:   62 years Exam Location[facility]  [facility] Procedure:      VAS US CAROTID Referring Phys: Dewitt Hoes DE LA TORRE --------------------------------------------------------------------------------  Indications:       CVA, Numbness, Weakness and Patient on Eliquis for history of  DVT. However, Eliquis was held several days post dental                    procedure. Risk Factors:      Hypertension, hyperlipidemia, Diabetes, prior MI, prior CVA X                    3. Other Factors:     Bioprosthetic aortic valve. Comparison Study:  No prior study on file Performing Technologist: Sherren Kerns RVS  Examination Guidelines: A complete evaluation includes B-mode imaging, spectral Doppler, color Doppler, and power Doppler as needed of all accessible portions of each vessel. Bilateral testing is considered an integral part of a complete examination. Limited examinations for reoccurring indications may be performed as noted.  Right Carotid Findings: +----------+--------+--------+--------+------------------+------------------+           PSV cm/sEDV cm/sStenosisPlaque DescriptionComments           +----------+--------+--------+--------+------------------+------------------+ CCA Prox  37      9                                 intimal thickening +----------+--------+--------+--------+------------------+------------------+ CCA Distal45      10              heterogenous                         +----------+--------+--------+--------+------------------+------------------+ ICA Prox   48      11              heterogenous                         +----------+--------+--------+--------+------------------+------------------+ ICA Mid   48      15                                                   +----------+--------+--------+--------+------------------+------------------+ ICA Distal40      11                                tortuous           +----------+--------+--------+--------+------------------+------------------+ ECA       43      7                                                    +----------+--------+--------+--------+------------------+------------------+ +----------+--------+-------+--------+-------------------+           PSV cm/sEDV cmsDescribeArm Pressure (mmHG) +----------+--------+-------+--------+-------------------+ Subclavian100                                        +----------+--------+-------+--------+-------------------+ +---------+--------+--+--------+-+ VertebralPSV cm/s25EDV cm/s7 +---------+--------+--+--------+-+  Left Carotid Findings: +----------+--------+--------+--------+------------------+--------+           PSV cm/sEDV cm/sStenosisPlaque DescriptionComments +----------+--------+--------+--------+------------------+--------+ CCA Prox  50      5               heterogenous               +----------+--------+--------+--------+------------------+--------+  CCA Distal52      13              heterogenous               +----------+--------+--------+--------+------------------+--------+ ICA Prox  76      16              calcific                   +----------+--------+--------+--------+------------------+--------+ ICA Mid   53      15                                         +----------+--------+--------+--------+------------------+--------+ ICA Distal51      15                                         +----------+--------+--------+--------+------------------+--------+ ECA       87      2                calcific                   +----------+--------+--------+--------+------------------+--------+ +----------+--------+--------+--------+-------------------+           PSV cm/sEDV cm/sDescribeArm Pressure (mmHG) +----------+--------+--------+--------+-------------------+ [id number]                                          +----------+--------+--------+--------+-------------------+ +---------+--------+--+--------+-+ VertebralPSV cm/s33EDV cm/s9 +---------+--------+--+--------+-+ Incidental DVT noted in the Internal Jugular Vein  Summary: Right Carotid: Velocities in the right ICA are consistent with a 1-39% stenosis. Left Carotid: Velocities in the left ICA are consistent with a 1-39% stenosis.               Incidental finding of acute DVT in the left internal jugular vein.               Right internal jugular vein and bilateral subclavian veins appear               patent. Vertebrals:  Bilateral vertebral arteries demonstrate antegrade flow. Subclavians: Normal flow hemodynamics were seen in bilateral subclavian              arteries. *See table(s) above for measurements and observations.  Electronically signed by Marvel Plan MD on 01/25/2023 at 6:30:41 PM.    Final    VAS [place] TRANSCRANIAL DOPPLER W BUBBLES  Result Date: 01/25/2023  Transcranial Doppler with Bubble Patient Name:  AADITYA SCHACHT  Date of Exam:   01/25/2023 Medical Rec #: [id number]          Accession #:    [id number] Date of Birth: 1950/10/24           Patient Gender: M Patient Age:   43 years Exam [facility]  [facility] Procedure:      VAS [place] TRANSCRANIAL DOPPLER W BUBBLES Referring Phys: Scheryl Marten XU --------------------------------------------------------------------------------  Indications: Acute IJV DVT and stroke. Limitations for diagnostic windows: Unable to insonate right transtemporal window. Comparison Study: No prior study Performing Technologist: Sherren Kerns RVS  Examination Guidelines: A complete  evaluation includes B-mode imaging, spectral Doppler, color Doppler, and power Doppler as needed of all accessible portions of each vessel. Bilateral testing is  considered an integral part of a complete examination. Limited examinations for reoccurring indications may be performed as noted.  Summary: No HITS at rest or during Valsalva. Negative transcranial Doppler Bubble study with no evidence of right to left intracardiac communication.  *See table(s) above for TCD measurements and observations.  Diagnosing physician: Marvel Plan MD Electronically signed by Marvel Plan MD on 01/25/2023 at 6:28:25 PM.    Final    ECHOCARDIOGRAM COMPLETE BUBBLE STUDY  Result Date: 01/25/2023    ECHOCARDIOGRAM REPORT   Patient Name:   HAWLEY LUBY Date of Exam: 01/25/2023 Medical Rec #:  [id number]         Height:       73.5 in Accession #:    [id number]        Weight:       232.0 lb Date of Birth:  07-04-1950          BSA:          2.303 m Patient Age:    72 years          BP:           126/81 mmHg Patient Gender: M                 HR:           60 bpm. Exam Location:  Inpatient Procedure: 3D Echo, Cardiac Doppler, Color Doppler and Saline Contrast Bubble            Study Indications:    Stroke I63.9  History:        Patient has prior history of Echocardiogram examinations, most                 recent 12/31/2021. Prior CABG, Stroke; Risk Factors:Diabetes,                 Hypertension and Former Smoker. S/P aortic valve replacement                 with bioprosthetic valve                 2022.                 Aortic Valve: bioprosthetic valve is present in the aortic                 position.  Sonographer:    Dondra Prader RVT RCS Referring Phys: [id number] Cecille Po MELVIN IMPRESSIONS  1. Left ventricular ejection fraction, by estimation, is 55 to 60%. The left ventricle has normal function. The left ventricle has no regional wall motion abnormalities. There is moderate concentric left ventricular hypertrophy. Left ventricular  diastolic parameters are consistent with Grade I diastolic dysfunction (impaired relaxation).  2. Right ventricular systolic function is normal. The right ventricular size is normal. Tricuspid regurgitation signal is inadequate for assessing PA pressure.  3. The mitral valve is grossly normal. Trivial mitral valve regurgitation. No evidence of mitral stenosis.  4. The aortic valve has been repaired/replaced. Aortic valve regurgitation is mild. There is a bioprosthetic valve present in the aortic position.  5. Aortic dilatation noted. There is mild dilatation of the aortic root, measuring 41 mm. Comparison(s): Changes from prior study are noted. The left ventricular function has improved. 12/31/21- EF 35-40%. Conclusion(s)/Recommendation(s): No intracardiac source of embolism detected on this transthoracic study. Consider a transesophageal echocardiogram to exclude cardiac source of embolism if clinically indicated. FINDINGS  Left Ventricle: Left ventricular ejection fraction, by estimation, is  55 to 60%. The left ventricle has normal function. The left ventricle has no regional wall motion abnormalities. The left ventricular internal cavity size was normal in size. There is  moderate concentric left ventricular hypertrophy. Abnormal (paradoxical) septal motion consistent with post-operative status. Left ventricular diastolic parameters are consistent with Grade I diastolic dysfunction (impaired relaxation). Right Ventricle: The right ventricular size is normal. No increase in right ventricular wall thickness. Right ventricular systolic function is normal. Tricuspid regurgitation signal is inadequate for assessing PA pressure. Left Atrium: Left atrial size was normal in size. Right Atrium: Right atrial size was normal in size. Pericardium: There is no evidence of pericardial effusion. Presence of epicardial fat layer. Mitral Valve: The mitral valve is grossly normal. Trivial mitral valve regurgitation. No evidence of  mitral valve stenosis. Tricuspid Valve: The tricuspid valve is grossly normal. Tricuspid valve regurgitation is not demonstrated. No evidence of tricuspid stenosis. Aortic Valve: The aortic valve has been repaired/replaced. Aortic valve regurgitation is mild. Aortic valve mean gradient measures 6.7 mmHg. Aortic valve peak gradient measures 12.3 mmHg. Aortic valve area, by VTI measures 3.73 cm. There is a bioprosthetic valve present in the aortic position. Pulmonic Valve: The pulmonic valve was grossly normal. Pulmonic valve regurgitation is trivial. No evidence of pulmonic stenosis. Aorta: Aortic dilatation noted. There is mild dilatation of the aortic root, measuring 41 mm. Venous: The inferior vena cava was not well visualized. IAS/Shunts: The atrial septum is grossly normal. Agitated saline contrast was given intravenously to evaluate for intracardiac shunting.  LEFT VENTRICLE PLAX 2D LVIDd:         5.30 cm   Diastology LVIDs:         3.60 cm   LV e' medial:    4.26 cm/s LV PW:         1.50 cm   LV E/e' medial:  9.4 LV IVS:        1.50 cm   LV e' lateral:   4.40 cm/s LVOT diam:     2.87 cm   LV E/e' lateral: 9.1 LV SV:         143 LV SV Index:   62 LVOT Area:     6.45 cm  RIGHT VENTRICLE            IVC RV S prime:     4.26 cm/s  IVC diam: 1.50 cm LEFT ATRIUM             Index LA diam:        4.40 cm 1.91 cm/m LA Vol (A2C):   46.7 ml 20.27 ml/m LA Vol (A4C):   50.8 ml 22.05 ml/m LA Biplane Vol: 50.1 ml 21.75 ml/m  AORTIC VALVE                     PULMONIC VALVE AV Area (Vmax):    3.94 cm      PV Vmax:       0.66 m/s AV Area (Vmean):   3.72 cm      PV Peak grad:  1.7 mmHg AV Area (VTI):     3.73 cm AV Vmax:           175.67 cm/s AV Vmean:          118.667 cm/s AV VTI:            0.382 m AV Peak Grad:      12.3 mmHg AV Mean Grad:      6.7 mmHg LVOT Vmax:  107.27 cm/s LVOT Vmean:        68.333 cm/s LVOT VTI:          0.221 m LVOT/AV VTI ratio: 0.58  AORTA Ao Root diam: 3.70 cm Ao Asc diam:  3.20 cm  MITRAL VALVE MV Area (PHT): 2.68 cm    SHUNTS MV Decel Time: 284 msec    Systemic VTI:  0.22 m MV E velocity: 39.95 cm/s  Systemic Diam: 2.87 cm MV A velocity: 56.00 cm/s MV E/A ratio:  0.71 Lennie Odor MD Electronically signed by Lennie Odor MD Signature Date/Time: 01/25/2023/5:36:34 PM    Final    MR ANGIO HEAD WO CONTRAST  Result Date: 01/24/2023 CLINICAL DATA:  Follow-up examination for stroke. EXAM: MRA HEAD WITHOUT CONTRAST TECHNIQUE: Angiographic images of the Circle of Willis were acquired using MRA technique without intravenous contrast. COMPARISON:  Prior MRI from earlier the same day. FINDINGS: Anterior circulation: Both internal carotid arteries are patent through the siphons without stenosis or other abnormality. Right A1 segment dominant and widely patent. Left A1 is hypoplastic and grossly patent on time-of-flight sequence. Tiny 2 mm outpouching extending from the over surface of the anterior communicating artery complex favored to reflect a small vascular infundibulum related to a hypoplastic left A1 segment. Anterior communicating artery complex otherwise unremarkable. Both ACAs patent without proximal stenosis. No M1 stenosis or occlusion. No proximal MCA branch occlusion or high-grade stenosis. Distal MCA branches perfused and symmetric. Posterior circulation: Left vertebral artery strongly dominant and widely patent. Left PICA patent. Visualized right vertebral artery is hypoplastic and largely terminates in PICA. Right PICA patent as well. Basilar widely patent without stenosis. Superior cerebral arteries patent bilaterally. Both PCAs primarily supplied via the basilar. PCAs patent distal aspects without proximal stenosis. Anatomic variants: Hypoplastic right vertebral artery terminates in PICA. Hypoplastic left A1 segment. Other: No intracranial aneurysm. IMPRESSION: 1. Negative intracranial MRA for large vessel occlusion or other emergent finding. No hemodynamically significant or  correctable stenosis. 2. 2 mm outpouching extending inferiorly from the anterior communicating artery complex, favored to reflect a small vascular infundibulum related to a hypoplastic left A1 segment. Small aneurysm disc felt to exclude. Attention at follow-up recommended. Electronically Signed   By: Rise Mu M.D.   On: 01/24/2023 19:42   MR BRAIN WO CONTRAST  Result Date: 01/24/2023 CLINICAL DATA:  Neuro deficit with acute stroke suspected EXAM: MRI HEAD WITHOUT CONTRAST TECHNIQUE: Multiplanar, multiecho pulse sequences of the brain and surrounding structures were obtained without intravenous contrast. COMPARISON:  Head CT from earlier today FINDINGS: Brain: Small acute infarct in the high and left perirolandic cortex. Diffusion alteration along the superior left frontal cortex attributed to susceptibility artifact when correlated with gradient imaging. Advanced chronic small vessel ischemic gliosis in the cerebral white matter with chronic lacunar infarcts in the deep gray nuclei and deep white matter bilaterally. Chronic cortically based infarcts along the left more than right occipital cortex and in the inferior left temporal occipital cortex- site highlighted on prior head CT. Small chronic bilateral cerebellar infarcts. Brain volume is normal for age. 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 No acute hemorrhage, hydrocephalus, or collection. Vascular: Normal flow voids. Skull and upper cervical spine: Normal marrow signal. Sinuses/Orbits: No acute finding IMPRESSION: 1. Small acute infarct in the superior left perirolandic cortex. 2. Widespread chronic ischemic injury as described. Electronically Signed   By: Tiburcio Pea M.D.   On: 01/24/2023 14:20   CT HEAD WO CONTRAST  Result Date: 01/24/2023 CLINICAL DATA:  Right lower extremity weakness/numbness. EXAM: CT HEAD WITHOUT CONTRAST TECHNIQUE: Contiguous axial  images were obtained from the base of the skull through the vertex without intravenous contrast. RADIATION DOSE REDUCTION: This exam was performed according to the departmental dose-optimization program which includes automated exposure control, adjustment of the mA and/or kV according to patient size and/or use of iterative reconstruction technique. COMPARISON:  CT head 06/06/2022 FINDINGS: Brain: There is no evidence of acute intracranial hemorrhage or extra-axial fluid collection. There is hypodensity with loss of gray-white differentiation along the undersurface of the left temporal occipital region which is new since the prior study (6-49, 5-44). Finding is consistent with an interval infarct, possibly subacute Numerous additional remote infarcts in the bilateral occipital lobes, right frontal cortex, hemispheric white matter, bilateral cerebellar hemispheres, and left basal ganglia are unchanged. Parenchymal volume is stable. The ventricles are stable in size. The pituitary and suprasellar region are normal. There is no mass lesion. There is no mass effect or midline shift. Vascular: There is calcification of the bilateral carotid siphons and vertebral arteries. Skull: Normal. Negative for fracture or focal lesion. Sinuses/Orbits: The paranasal sinuses are clear. Bilateral lens implants are in place. The globes and orbits are otherwise unremarkable. Other: The mastoid air cells and middle ear cavities are clear. IMPRESSION: 1. New hypodensity with loss of gray-white differentiation along the undersurface of the left temporal occipital region consistent with interval infarct, possibly subacute. Consider brain MRI for characterization. 2. No acute intracranial hemorrhage. Electronically Signed   By: Lesia Hausen M.D.   On: 01/24/2023 11:42     Subjective: Seen examined bedside, resting calmly.  Lying in bed.  Discharging to SNF this afternoon.  No questions, concerns or complaints.  Denies headache, no  dizziness, no visual changes/disturbances, no chest pain, no palpitations, no shortness of breath, no abdominal pain, no fever/chills/night sweats, no nausea/vomiting/diarrhea, no fatigue.  No acute events overnight per nursing staff.  Discharge Exam: Vitals:   01/27/23 0754 01/27/23 1203  BP: (!) 154/87 128/89  Pulse: 67 65  Resp: 18 18  Temp: 98.4 F (36.9 C) 97.8 F (36.6 C)  SpO2: 97% 96%   Vitals:   01/26/23 2100 01/27/23 0500 01/27/23 0754 01/27/23 1203  BP: (!) 160/95 136/85 (!) 154/87 128/89  Pulse: 76 68 67 65  Resp: 19 19 18 18   Temp: 98.5 F (36.9 C) 99.4 F (37.4 C) 98.4 F (36.9 C) 97.8 F (36.6 C)  TempSrc: Oral Oral Oral Oral  SpO2:  97% 97% 96%  Weight:  99.7 kg    Height:        Physical Exam: GEN: NAD, alert and oriented to person/place (hospital/[place]), but not time (no year given), or person (President" no answer given), chronically ill in appearance, appears older than stated age HEENT: NCAT, PERRL, EOMI, sclera clear, poor dentition, dry mucous membranes PULM: CTAB w/o wheezes/crackles, normal respiratory effort, on room air CV: RRR w/o M/G/R GI: abd soft, NTND, NABS, no R/G/M MSK: no peripheral edema, muscle strength decreased 4/5 right lower extremity  PSYCH: normal mood/affect Integumentary: dry/intact, no rashes or wounds   Neuro Exam Mental Status: A&O to self/place but not time or person, no dysarthria, no aphasia  Cranial Nerves: visual fields full, PERRL, EOMi, intact smooth pursuit, no nystagmus, no ptosis, facial sensation intact bilaterally, 5/5 jaw strength, nasolabial fold & smile symetric,  eyebrow  raise & 5/5 eye closure symetric, hearing symmetric and normal to rubbing fingers, palate elevates symmetrically,  head turning and shoulder shrug intact and symetric bilaterally, tongue protrusion is midline Motor: no pronator drift, LUE 5/5,   LLE 5/5,   RUE 5/5,   RLE 4/5   Sensory: Sensation decreased to light touch anterior right  shin Coordination/Movement: no tremor noted, no dysmetria    The results of significant diagnostics from this hospitalization (including imaging, microbiology, ancillary and laboratory) are listed below for reference.     Microbiology: No results found for this or any previous visit (from the past 240 hour(s)).   Labs: BNP (last 3 results) Recent Labs    09/14/22 1903  BNP 88.4   Basic Metabolic Panel: Recent Labs  Lab 01/24/23 1040 01/24/23 1055 01/25/23 0301 01/26/23 0401 01/27/23 0430  NA 144 143 141 138 136  K 4.3 4.2 3.4* 3.7 3.3*  CL 107 108 108 106 106  CO2 23  --  22 21* 21*  GLUCOSE 191* 180* 157* 168* 147*  BUN 33* 31* 29* 34* 37*  CREATININE 3.25* 3.40* 2.66* 3.04* 2.96*  CALCIUM 9.2  --  8.9 8.8* 8.8*  MG  --   --   --  2.0  --    Liver Function Tests: Recent Labs  Lab 01/24/23 1040 01/25/23 0301  AST 20 19  ALT 28 23  ALKPHOS 83 72  BILITOT 0.6 0.6  PROT 5.7* 5.5*  ALBUMIN 3.4* 3.1*   No results for input(s): "LIPASE", "AMYLASE" in the last 168 hours. No results for input(s): "AMMONIA" in the last 168 hours. CBC: Recent Labs  Lab 01/24/23 1040 01/24/23 1055 01/25/23 0301  WBC 5.4  --  5.5  NEUTROABS 3.6  --   --   HGB 12.2* 11.6* 11.7*  HCT 36.2* 34.0* 34.8*  MCV 89.6  --  85.7  PLT 121*  --  107*   Cardiac Enzymes: No results for input(s): "CKTOTAL", "CKMB", "CKMBINDEX", "TROPONINI" in the last 168 hours. BNP: Invalid input(s): "POCBNP" CBG: Recent Labs  Lab 01/26/23 1209 01/26/23 1648 01/26/23 2114 01/27/23 0611 01/27/23 1205  GLUCAP 173* 128* 159* 128* 166*   D-Dimer No results for input(s): "DDIMER" in the last 72 hours. Hgb A1c Recent Labs    01/25/23 0301  HGBA1C 8.8*   Lipid Profile Recent Labs    01/25/23 0301  CHOL 90  HDL 23*  LDLCALC 29  TRIG 960*  CHOLHDL 3.9   Thyroid function studies No results for input(s): "TSH", "T4TOTAL", "T3FREE", "THYROIDAB" in the last 72 hours.  Invalid input(s):  "FREET3" Anemia work up No results for input(s): "VITAMINB12", "FOLATE", "FERRITIN", "TIBC", "IRON", "RETICCTPCT" in the last 72 hours. Urinalysis    Component Value Date/Time   COLORURINE YELLOW 01/24/2023 1040   APPEARANCEUR HAZY (A) 01/24/2023 1040   LABSPEC 1.012 01/24/2023 1040   PHURINE 6.0 01/24/2023 1040   GLUCOSEU >=500 (A) 01/24/2023 1040   HGBUR NEGATIVE 01/24/2023 1040   BILIRUBINUR NEGATIVE 01/24/2023 1040   BILIRUBINUR neg 08/10/2011 1236   KETONESUR NEGATIVE 01/24/2023 1040   PROTEINUR 100 (A) 01/24/2023 1040   UROBILINOGEN 0.2 08/10/2011 1236   UROBILINOGEN 0.2 07/27/2007 1303   NITRITE NEGATIVE 01/24/2023 1040   LEUKOCYTESUR SMALL (A) 01/24/2023 1040   Sepsis Labs Recent Labs  Lab 01/24/23 1040 01/25/23 0301  WBC 5.4 5.5   Microbiology No results found for this or any previous visit (from the past 240 hour(s)).   Time coordinating discharge: Over 30 minutes  SIGNED:   Alvira Philips [place], DO  [facility] 01/27/2023, 1:12 PM

## 2023-01-27 NOTE — Plan of Care (Signed)
  Problem: Coping: Goal: Will verbalize positive feelings about self Outcome: Progressing

## 2023-01-27 NOTE — TOC Transition Note (Signed)
Transition of Care [facility]) - CM/SW Discharge Note   Patient Details  Name: Shane Holloway MRN: [id number] Date of Birth: [birth year]  Transition of Care [facility]) CM/SW Contact:  Baldemar Lenis, LCSW Phone Number: 01/27/2023, 1:31 PM   Clinical Narrative:   CSW contacted [facility] to ask for bed availability, they can accept patient. CSW asked CMA to send in insurance authorization, authorization was received. [facility] can accept today, patient is medically stable. CSW met with patient, he is in agreement. CSW sent discharge information and scheduled transport with [facility] for next available.  Nurse to call report to [phone] Room 507-A    Final next level of care: Skilled Nursing Facility Barriers to Discharge: Barriers Resolved   Patient Goals and CMS Choice CMS Medicare.gov Compare Post Acute Care list provided to:: Patient Choice offered to / list presented to : Patient  Discharge Placement                Patient chooses bed at: [facility] Patient to be transferred to facility by: [facility] Name of family member notified: Self Patient and family notified of of transfer: 01/27/23  Discharge Plan and Services Additional resources added to the After Visit Summary for   In-house Referral: Clinical Social Work                                   Social Determinants of Health (SDOH) Interventions SDOH Screenings   Food Insecurity: No Food Insecurity (01/24/2023)  Housing: Low Risk  (01/24/2023)  Transportation Needs: No Transportation Needs (01/24/2023)  Utilities: Not At Risk (01/24/2023)  Depression (PHQ2-9): High Risk (05/20/2022)  Financial Resource Strain: Low Risk  (11/22/2022)   [facility] from [facility], [facility]  Social Connections: Unknown (09/01/2020)   Received from [facility], [facility]  Tobacco Use: Medium Risk (01/24/2023)  Health Literacy: High Risk (03/28/2021)   Received from [facility], [facility]     Readmission Risk Interventions     No data to display

## 2023-01-27 NOTE — Progress Notes (Signed)
Physical Therapy Treatment Patient Details Name: Shane Holloway MRN: [id number] DOB: 03-08-1951 Today's Date: 01/27/2023   History of Present Illness 72 y.o. male presented to [facility] ED on 8/16 with complaints of right lower extremity weakness, numbness. + Acute CVA. PMHx: HTN, HLD, chronic systolic/diastolic congestive heart failure, CKD stage IV, CAD s/p CABG, DM2, history of SVT, history of DVT on Eliquis, history of CVA, history of aortic valve repair, obesity.    PT Comments  Eager to work with therapy. Demonstrates improved RLE strength and functional abilities today. Progressed with gait training at a min assist level up to 40 feet. Needs physical assist for walker control, VC for sequencing and for RLE placement awareness. Cues for safety with transfers, min assist from elevated bed and recliner x2. Performed LE exercises. Still with trace Rt ankle movement. Able to lift RLE fully against gravity today (Rt knee extension.) Patient will continue to benefit from skilled physical therapy services to further improve independence with functional mobility.     If plan is discharge home, recommend the following: A lot of help with walking and/or transfers;Assistance with cooking/housework;Direct supervision/assist for medications management;Direct supervision/assist for financial management;Assist for transportation;Help with stairs or ramp for entrance;A little help with bathing/dressing/bathroom   Can travel by private vehicle     Yes  Equipment Recommendations  None recommended by PT    Recommendations for Other Services       Precautions / Restrictions Precautions Precautions: Fall Restrictions Weight Bearing Restrictions: No     Mobility  Bed Mobility Overal bed mobility: Needs Assistance Bed Mobility: Supine to Sit     Supine to sit: Used rails, Contact guard, HOB elevated     General bed mobility comments: CGA for safety, cues for technique, requires extra  time. Challenged turning towards Lt.    Transfers Overall transfer level: Needs assistance Equipment used: Rolling walker (2 wheels) Transfers: Sit to/from Stand, Bed to chair/wheelchair/BSC Sit to Stand: Min assist, From elevated surface Stand pivot transfers: Min assist         General transfer comment: Min assist for boost to stand from elevated bed surface, and recliner x2. Cues for hand placement and technique. Needs cues for safety and recall to reach back for surface prior to sitting. He does look back to ensure chair is aligned.    Ambulation/Gait Ambulation/Gait assistance: Min assist Gait Distance (Feet): 40 Feet Assistive device: Rolling walker (2 wheels) Gait Pattern/deviations: Step-to pattern, Decreased stance time - right, Decreased dorsiflexion - right, Decreased stride length, Decreased weight shift to right, Knees buckling, Steppage Gait velocity: decr Gait velocity interpretation: <1.31 ft/sec, indicative of household ambulator Pre-gait activities: static march General Gait Details: Educated on AD use with RW for support. Good RUE grip. RLE shows improved control with forward and backwards movement. Pt required min assist for balance and RW control. VC for sequencing and awareness of RLE placement. Heavy RW use for support. Practiced stepping forwards and backwards throughout room.   Stairs             Wheelchair Mobility     Tilt Bed    Modified Rankin (Stroke Patients Only) Modified Rankin (Stroke Patients Only) Pre-Morbid Rankin Score: No symptoms Modified Rankin: Moderately severe disability     Balance Overall balance assessment: Needs assistance Sitting-balance support: No upper extremity supported, Feet supported Sitting balance-Leahy Scale: Fair     Standing balance support: Single extremity supported, Reliant on assistive device for balance Standing balance-Leahy Scale: Poor  Cognition  Arousal: Alert Behavior During Therapy: WFL for tasks assessed/performed Overall Cognitive Status: No family/caregiver present to determine baseline cognitive functioning                                          Exercises General Exercises - Lower Extremity Ankle Circles/Pumps: AROM, Both, AAROM, 10 reps, Seated, Limitations Ankle Circles/Pumps Limitations: minimal movement Rt ankle Quad Sets: Strengthening, Both, 10 reps, Seated Gluteal Sets: Strengthening, Both, 15 reps, Seated Long Arc Quad: Strengthening, Both, 10 reps, Seated Hip ABduction/ADduction: Strengthening, Both, 10 reps, Seated Hip Flexion/Marching: Strengthening, Both, 10 reps, Seated    General Comments        Pertinent Vitals/Pain Pain Assessment Pain Assessment: Faces Faces Pain Scale: Hurts a little bit Pain Location: neck Pain Descriptors / Indicators: Sore    Home Living                          Prior Function            PT Goals (current goals can now be found in the care plan section) Acute Rehab PT Goals Patient Stated Goal: Get well PT Goal Formulation: With patient Time For Goal Achievement: 02/08/23 Potential to Achieve Goals: Good Progress towards PT goals: Progressing toward goals    Frequency    Min 1X/week      PT Plan      Co-evaluation              AM-PAC PT "6 Clicks" Mobility   Outcome Measure  Help needed turning from your back to your side while in a flat bed without using bedrails?: None Help needed moving from lying on your back to sitting on the side of a flat bed without using bedrails?: A Little Help needed moving to and from a bed to a chair (including a wheelchair)?: A Little Help needed standing up from a chair using your arms (e.g., wheelchair or bedside chair)?: A Little Help needed to walk in hospital room?: A Little Help needed climbing 3-5 steps with a railing? : Total 6 Click Score: 17    End of Session Equipment  Utilized During Treatment: Gait belt Activity Tolerance: Patient tolerated treatment well Patient left: in chair;with call bell/phone within reach;with chair alarm set Nurse Communication: Mobility status PT Visit Diagnosis: Unsteadiness on feet (R26.81);Other abnormalities of gait and mobility (R26.89);Hemiplegia and hemiparesis;History of falling (Z91.81);Difficulty in walking, not elsewhere classified (R26.2);Pain Hemiplegia - Right/Left: Right Hemiplegia - caused by: Cerebral infarction Pain - part of body:  (neck)     Time: 7829-5621 PT Time Calculation (min) (ACUTE ONLY): 16 min  Charges:    $Gait Training: 8-22 mins PT General Charges $$ ACUTE PT VISIT: 1 Visit                     Kathlyn [place], PT, DPT [facility]  [facility] Services Physical Therapist Office: [phone] Website: [email or web address]    Berton Mount 01/27/2023, 12:22 PM

## 2023-02-12 ENCOUNTER — Other Ambulatory Visit: Payer: Self-pay

## 2023-02-12 ENCOUNTER — Emergency Department (HOSPITAL_COMMUNITY): Payer: Medicare HMO

## 2023-02-12 ENCOUNTER — Inpatient Hospital Stay (HOSPITAL_COMMUNITY)
Admission: EM | Admit: 2023-02-12 | Discharge: 2023-02-18 | DRG: 388 | Disposition: A | Discharge status: Rehabilitation Facility | Payer: Medicare HMO | Attending: Internal Medicine | Admitting: Internal Medicine

## 2023-02-12 ENCOUNTER — Encounter (HOSPITAL_COMMUNITY): Payer: Self-pay

## 2023-02-12 DIAGNOSIS — Z952 Presence of prosthetic heart valve: Secondary | ICD-10-CM

## 2023-02-12 DIAGNOSIS — Z961 Presence of intraocular lens: Secondary | ICD-10-CM | POA: Diagnosis present

## 2023-02-12 DIAGNOSIS — E876 Hypokalemia: Secondary | ICD-10-CM | POA: Diagnosis present

## 2023-02-12 DIAGNOSIS — M21371 Foot drop, right foot: Secondary | ICD-10-CM | POA: Diagnosis present

## 2023-02-12 DIAGNOSIS — I5022 Chronic systolic (congestive) heart failure: Secondary | ICD-10-CM | POA: Diagnosis present

## 2023-02-12 DIAGNOSIS — I951 Orthostatic hypotension: Secondary | ICD-10-CM | POA: Diagnosis present

## 2023-02-12 DIAGNOSIS — Z87891 Personal history of nicotine dependence: Secondary | ICD-10-CM

## 2023-02-12 DIAGNOSIS — I2510 Atherosclerotic heart disease of native coronary artery without angina pectoris: Secondary | ICD-10-CM | POA: Diagnosis present

## 2023-02-12 DIAGNOSIS — I63532 Cerebral infarction due to unspecified occlusion or stenosis of left posterior cerebral artery: Secondary | ICD-10-CM | POA: Diagnosis present

## 2023-02-12 DIAGNOSIS — I5042 Chronic combined systolic (congestive) and diastolic (congestive) heart failure: Secondary | ICD-10-CM | POA: Diagnosis present

## 2023-02-12 DIAGNOSIS — I447 Left bundle-branch block, unspecified: Secondary | ICD-10-CM | POA: Diagnosis present

## 2023-02-12 DIAGNOSIS — Z951 Presence of aortocoronary bypass graft: Secondary | ICD-10-CM

## 2023-02-12 DIAGNOSIS — K56609 Unspecified intestinal obstruction, unspecified as to partial versus complete obstruction: Secondary | ICD-10-CM | POA: Diagnosis not present

## 2023-02-12 DIAGNOSIS — Z7901 Long term (current) use of anticoagulants: Secondary | ICD-10-CM

## 2023-02-12 DIAGNOSIS — F0150 Vascular dementia without behavioral disturbance: Secondary | ICD-10-CM | POA: Diagnosis present

## 2023-02-12 DIAGNOSIS — Z794 Long term (current) use of insulin: Secondary | ICD-10-CM

## 2023-02-12 DIAGNOSIS — I10 Essential (primary) hypertension: Secondary | ICD-10-CM | POA: Diagnosis present

## 2023-02-12 DIAGNOSIS — E1165 Type 2 diabetes mellitus with hyperglycemia: Secondary | ICD-10-CM | POA: Diagnosis present

## 2023-02-12 DIAGNOSIS — R29702 NIHSS score 2: Secondary | ICD-10-CM | POA: Diagnosis present

## 2023-02-12 DIAGNOSIS — Z8673 Personal history of transient ischemic attack (TIA), and cerebral infarction without residual deficits: Secondary | ICD-10-CM

## 2023-02-12 DIAGNOSIS — I25810 Atherosclerosis of coronary artery bypass graft(s) without angina pectoris: Secondary | ICD-10-CM | POA: Diagnosis present

## 2023-02-12 DIAGNOSIS — Z7982 Long term (current) use of aspirin: Secondary | ICD-10-CM

## 2023-02-12 DIAGNOSIS — I6340 Cerebral infarction due to embolism of unspecified cerebral artery: Secondary | ICD-10-CM | POA: Diagnosis present

## 2023-02-12 DIAGNOSIS — Z91013 Allergy to seafood: Secondary | ICD-10-CM

## 2023-02-12 DIAGNOSIS — Z79899 Other long term (current) drug therapy: Secondary | ICD-10-CM

## 2023-02-12 DIAGNOSIS — R4781 Slurred speech: Secondary | ICD-10-CM | POA: Diagnosis present

## 2023-02-12 DIAGNOSIS — Z7984 Long term (current) use of oral hypoglycemic drugs: Secondary | ICD-10-CM

## 2023-02-12 DIAGNOSIS — I130 Hypertensive heart and chronic kidney disease with heart failure and stage 1 through stage 4 chronic kidney disease, or unspecified chronic kidney disease: Secondary | ICD-10-CM | POA: Diagnosis present

## 2023-02-12 DIAGNOSIS — Z6828 Body mass index (BMI) 28.0-28.9, adult: Secondary | ICD-10-CM

## 2023-02-12 DIAGNOSIS — N184 Chronic kidney disease, stage 4 (severe): Secondary | ICD-10-CM | POA: Diagnosis present

## 2023-02-12 DIAGNOSIS — Z8616 Personal history of COVID-19: Secondary | ICD-10-CM

## 2023-02-12 DIAGNOSIS — R270 Ataxia, unspecified: Secondary | ICD-10-CM | POA: Diagnosis present

## 2023-02-12 DIAGNOSIS — E785 Hyperlipidemia, unspecified: Secondary | ICD-10-CM | POA: Diagnosis present

## 2023-02-12 DIAGNOSIS — E669 Obesity, unspecified: Secondary | ICD-10-CM | POA: Diagnosis present

## 2023-02-12 DIAGNOSIS — E1122 Type 2 diabetes mellitus with diabetic chronic kidney disease: Secondary | ICD-10-CM | POA: Diagnosis present

## 2023-02-12 DIAGNOSIS — Z7985 Long-term (current) use of injectable non-insulin antidiabetic drugs: Secondary | ICD-10-CM

## 2023-02-12 DIAGNOSIS — Z8249 Family history of ischemic heart disease and other diseases of the circulatory system: Secondary | ICD-10-CM

## 2023-02-12 DIAGNOSIS — I252 Old myocardial infarction: Secondary | ICD-10-CM

## 2023-02-12 DIAGNOSIS — Z9842 Cataract extraction status, left eye: Secondary | ICD-10-CM

## 2023-02-12 DIAGNOSIS — Z9841 Cataract extraction status, right eye: Secondary | ICD-10-CM

## 2023-02-12 DIAGNOSIS — Z86718 Personal history of other venous thrombosis and embolism: Secondary | ICD-10-CM

## 2023-02-12 DIAGNOSIS — I639 Cerebral infarction, unspecified: Principal | ICD-10-CM

## 2023-02-12 DIAGNOSIS — Z955 Presence of coronary angioplasty implant and graft: Secondary | ICD-10-CM

## 2023-02-12 DIAGNOSIS — Z882 Allergy status to sulfonamides status: Secondary | ICD-10-CM

## 2023-02-12 DIAGNOSIS — N400 Enlarged prostate without lower urinary tract symptoms: Secondary | ICD-10-CM | POA: Diagnosis present

## 2023-02-12 DIAGNOSIS — Z888 Allergy status to other drugs, medicaments and biological substances status: Secondary | ICD-10-CM

## 2023-02-12 LAB — CBC
HCT: 40.9 % (ref 39.0–52.0)
Hemoglobin: 13.4 g/dL (ref 13.0–17.0)
MCH: 28.6 pg (ref 26.0–34.0)
MCHC: 32.8 g/dL (ref 30.0–36.0)
MCV: 87.2 fL (ref 80.0–100.0)
Platelets: 133 10*3/uL — ABNORMAL LOW (ref 150–400)
RBC: 4.69 MIL/uL (ref 4.22–5.81)
RDW: 14.3 % (ref 11.5–15.5)
WBC: 11.4 10*3/uL — ABNORMAL HIGH (ref 4.0–10.5)
nRBC: 0 % (ref 0.0–0.2)

## 2023-02-12 LAB — COMPREHENSIVE METABOLIC PANEL
ALT: 24 U/L (ref 0–44)
AST: 19 U/L (ref 15–41)
Albumin: 3.8 g/dL (ref 3.5–5.0)
Alkaline Phosphatase: 96 U/L (ref 38–126)
Anion gap: 13 (ref 5–15)
BUN: 38 mg/dL — ABNORMAL HIGH (ref 8–23)
CO2: 19 mmol/L — ABNORMAL LOW (ref 22–32)
Calcium: 9 mg/dL (ref 8.9–10.3)
Chloride: 109 mmol/L (ref 98–111)
Creatinine, Ser: 3.24 mg/dL — ABNORMAL HIGH (ref 0.61–1.24)
GFR, Estimated: 19 mL/min — ABNORMAL LOW (ref >60)
Glucose, Bld: 230 mg/dL — ABNORMAL HIGH (ref 70–99)
Potassium: 3.4 mmol/L — ABNORMAL LOW (ref 3.5–5.1)
Sodium: 141 mmol/L (ref 135–145)
Total Bilirubin: 0.8 mg/dL (ref 0.3–1.2)
Total Protein: 6.5 g/dL (ref 6.5–8.1)

## 2023-02-12 LAB — APTT: aPTT: 30 s (ref 24–36)

## 2023-02-12 LAB — LIPASE, BLOOD: Lipase: 46 U/L (ref 11–51)

## 2023-02-12 MED ORDER — ONDANSETRON HCL 4 MG/2ML IJ SOLN
4.0000 mg | Freq: Once | INTRAMUSCULAR | Status: AC
  Administered 2023-02-12: 4 mg via INTRAVENOUS
  Filled 2023-02-12: qty 2

## 2023-02-12 MED ORDER — SODIUM CHLORIDE 0.9 % IV BOLUS
1000.0000 mL | Freq: Once | INTRAVENOUS | Status: AC
  Administered 2023-02-12: 1000 mL via INTRAVENOUS

## 2023-02-12 MED ORDER — IOHEXOL 350 MG/ML SOLN
75.0000 mL | Freq: Once | INTRAVENOUS | Status: AC | PRN
  Administered 2023-02-12: 75 mL via INTRAVENOUS

## 2023-02-12 MED ORDER — MECLIZINE HCL 25 MG PO TABS
25.0000 mg | ORAL_TABLET | Freq: Once | ORAL | Status: AC
  Administered 2023-02-12: 25 mg via ORAL
  Filled 2023-02-12: qty 1

## 2023-02-12 MED ORDER — IOHEXOL 350 MG/ML SOLN: 50.0000 mL | Freq: Once | INTRAVENOUS | Status: DC | PRN

## 2023-02-12 NOTE — ED Notes (Signed)
Patient transported to CT 

## 2023-02-12 NOTE — ED Notes (Signed)
Patient returned from MRI at this time.  

## 2023-02-12 NOTE — ED Provider Notes (Signed)
[place] EMERGENCY DEPARTMENT AT [facility] Provider Note   CSN: [id number] Arrival date & time: 02/12/23  2023     History  Chief Complaint  Patient presents with   Nausea    Nausea and vomiting got 4 zofran     SANDRA LAFEVERS is a 72 y.o. male.  HPI This is a 72 year old male history of T2DM, CAD, acute CVA, presenting via EMS  due to nausea and vomiting and dizziness. Patient said he was driving to the church to pray when he started feeling dizzy so he turned around and went back home.  Said's, as soon as he got home he started throwing up and he called  EMS.  EMS reported giving the patient 4 mg of Zofran en route to the ED but that did not help.  Encounter with the patient in the ED, continuing to vomit green brown digested food.  Of note, patient had a stroke 4 weeks ago, says he is not himself after that cerebral incident.    Home Medications Prior to Admission medications   Medication Sig Start Date End Date Taking? Authorizing Provider  Insulin NPH, Human,, Isophane, (HUMULIN N) 100 UNIT/ML Kiwkpen Inject 10 Units into the skin 2 (two) times daily. 02/04/23 02/04/24 Yes [provider]  amiodarone (PACERONE) 200 MG tablet Take 1 tablet by mouth daily. 04/17/21 03/22/23  [provider]  apixaban (ELIQUIS) 5 MG TABS tablet Take 2 tablets (10 mg total) by mouth 2 (two) times daily for 6 days, THEN 1 tablet (5 mg total) 2 (two) times daily. 01/27/23 05/03/23  [place], Eric J, DO  aspirin EC 81 MG tablet Take 81 mg by mouth daily. 05/23/22 05/23/23  [provider]  atorvastatin (LIPITOR) 80 MG tablet Take 80 mg by mouth daily. 12/12/20   [provider]  empagliflozin (JARDIANCE) 10 MG TABS tablet Take 1 tablet by mouth daily. 08/08/21   [provider]  ENTRESTO 49-51 MG Take 1 tablet by mouth 2 (two) times daily.    [provider]  ezetimibe (ZETIA) 10 MG tablet Take 10 mg by mouth daily.    [provider]  finasteride (PROSCAR) 5 MG tablet Take 5 mg by mouth daily. 09/05/22   [provider]  insulin detemir (LEVEMIR) 100 UNIT/ML injection Inject 0.12 mLs (12 Units total) into the skin at bedtime. 01/27/23   [place], Alvira Philips, DO  metoprolol succinate (TOPROL-XL) 50 MG 24 hr tablet Take 50 mg by mouth daily.    [provider]  nitroGLYCERIN (NITROSTAT) 0.4 MG SL tablet Place under the tongue. 01/23/22   [provider]  Semaglutide, 1 MG/DOSE, (OZEMPIC, 1 MG/DOSE,) 4 MG/3ML SOPN Inject 1 mg into the skin once a week.    [provider]  tamsulosin (FLOMAX) 0.4 MG CAPS capsule Take 0.4 mg by mouth daily.    [provider]      Allergies    Altace [ramipril], Shellfish allergy, and Sulfa antibiotics    Review of Systems   Review of Systems  Physical Exam Updated Vital Signs BP (!) 155/89   Pulse 63   Temp 97.8 F (36.6 C)   Resp 20   Ht 6\' 2"  (1.88 m)   Wt 100 kg   SpO2 98%   BMI 28.31 kg/m  Physical Exam  ED Results / Procedures / Treatments   Labs (all labs ordered are listed, but only abnormal results are displayed) Labs Reviewed  COMPREHENSIVE METABOLIC PANEL - Abnormal;  Notable for the following components:      Result Value   Potassium 3.4 (*)    CO2 19 (*)    Glucose, Bld 230 (*)    BUN 38 (*)    Creatinine, Ser 3.24 (*)    GFR, Estimated 19 (*)    All other components within normal limits  CBC - Abnormal; Notable for the following components:   WBC 11.4 (*)    Platelets 133 (*)    All other components within normal limits  LIPASE, BLOOD  APTT  URINALYSIS, ROUTINE W REFLEX MICROSCOPIC  TROPONIN I (HIGH SENSITIVITY)    EKG EKG Interpretation Date/Time:  Wednesday February 12 2023 20:28:57 EDT Ventricular Rate:  63 PR Interval:  224 QRS Duration:  134 QT Interval:  462 QTC Calculation: 473 R Axis:   -26  Text Interpretation: Sinus rhythm Prolonged PR interval IVCD, consider atypical LBBB No  significant change since last tracing Confirmed by Richardean Canal [phone]) on 02/12/2023 8:56:19 PM  Radiology CT ABDOMEN PELVIS W CONTRAST  Result Date: 02/12/2023 CLINICAL DATA:  Abdominal pain. EXAM: CT ABDOMEN AND PELVIS WITH CONTRAST TECHNIQUE: Multidetector CT imaging of the abdomen and pelvis was performed using the standard protocol following bolus administration of intravenous contrast. RADIATION DOSE REDUCTION: This exam was performed according to the departmental dose-optimization program which includes automated exposure control, adjustment of the mA and/or kV according to patient size and/or use of iterative reconstruction technique. CONTRAST:  75mL OMNIPAQUE IOHEXOL 350 MG/ML SOLN COMPARISON:  None Available. FINDINGS: Lower chest: Moderate elevation noted right hemidiaphragm. Lung bases are clear of infiltrates. There are sternotomy sutures and CABG changes with heavy native CAD. No pericardial effusion. There is a metallic aortic valve prosthesis. The aortic root is dilated at the sinuses of Valsalva where it measures 4.6 cm on 7:80. Surveillance imaging will be needed. The descending aorta is heavily calcified. There is a small hiatal hernia. Hepatobiliary: The liver is 18.3 cm in length and mildly steatotic. No mass enhancement is seen. Gallbladder surgically absent with dilatation of the common bile duct measuring 12 mm. There is no intrahepatic biliary dilatation or calcified ductal filling defect. Pancreas: No abnormality. Spleen: Mildly enlarged, 14.8 cm AP.  No mass. Adrenals/Urinary Tract: There is no adrenal mass. There is an irregular 3 mm nonobstructive caliceal stone in the lower pole of the left kidney, occasional few punctate nonobstructive caliceal stones on the right. There is no obstructing urinary stone or hydronephrosis. There is a 2 cm Bosniak 1 homogeneous cyst in the posterior left kidney, Hounsfield density is 0.7. There is a 1.2 cm Bosniak 1 cyst in the superior pole right  kidney, Hounsfield density is -8.5. No follow-up imaging is recommended. There are additional scattered subcentimeter too small to characterize hypodensities in both kidneys consistent with Bosniak 2 cyst. No follow-up imaging is recommended. The bladder thickness is normal. Enlarged prostate impresses into the posterior bladder. Stomach/Bowel: Unremarkable stomach apart from a small hiatal hernia. Normal caliber duodenum and jejunum. There are dilated ileal small bowel segments in the right upper to mid abdomen. The mid/lower abdominal small bowel is collapsed. Maximum small bowel caliber is 3.6 cm. The exact transition point was not found. Etiology could be enteritis, occult adhesions or occult internal hernia. The appendix is normal and well seen. There is mild fecal stasis. Scattered diverticula without evidence of diverticulitis. Vascular/Lymphatic: Heavy aortoiliac and visceral branch vessel atherosclerosis. No AAA. No lymphadenopathy is seen. Reproductive: Enlarged prostate, measures 5.2 cm transverse. Bilateral pelvic  phleboliths. Other: Small umbilical and bilateral inguinal fat hernias. No incarcerated hernia. No free hemorrhage, free fluid or free air. No bowel pneumatosis is seen. Musculoskeletal: Degenerative change lumbar spine. Degenerative disc disease thoracic spine. Mild osteopenia. No acute or other significant osseous findings. IMPRESSION: 1. Dilated ileal small bowel segments in the right upper to mid abdomen with collapsed mid/lower small bowel consistent with intermediate to high-grade SBO. The exact transition point was not found. Etiology could be enteritis, occult adhesions or occult internal hernia. 2. Constipation and diverticulosis. 3. Prostatomegaly. 4. Aortic atherosclerosis with dilated aortic root at the sinuses of Valsalva where it measures 4.6 cm. Surveillance imaging will be needed. Recommend semi-annual imaging followup by CTA or MRA and referral to cardiothoracic surgery if not  already obtained. This recommendation follows 2010 ACCF/AHA/AATS/ACR/ ASA/SCA/SCAI/SIR/STS/SVM Guidelines for the Diagnosis and Management of Patients With Thoracic Aortic Disease. Circulation. 2010; 121: G644-I347. Aortic aneurysm NOS (ICD10-I71.9). 5. Mild hepatic steatosis. 6. Mild splenomegaly. 7. Nonobstructive nephrolithiasis. 8. Umbilical and inguinal fat hernias. Aortic Atherosclerosis (ICD10-I70.0). Electronically Signed   By: Almira Bar M.D.   On: 02/12/2023 23:15   MR BRAIN WO CONTRAST  Result Date: 02/12/2023 CLINICAL DATA:  Initial evaluation for acute TIA, right lower extremity weakness. EXAM: MRI HEAD WITHOUT CONTRAST TECHNIQUE: Multiplanar, multiecho pulse sequences of the brain and surrounding structures were obtained without intravenous contrast. COMPARISON:  Prior brain MRI from 01/24/2023. FINDINGS: Brain: Advanced cerebral atrophy for age. Extensive chronic microvascular ischemic disease involving the supratentorial cerebral white matter. Multiple scattered remote lacunar infarcts present about the hemispheric cerebral white matter, deep gray nuclei, and pons. Multiple chronic bilateral cerebellar infarcts noted. Small remote bilateral occipital infarcts. There has been interval evolution of previously identified left ACA distribution infarct involving the left perirolandic cortex, less conspicuous as compared to previous (series 2, images 41, 39). Minimal associated petechial blood products without hemorrhagic transformation or mass effect (series 8, image 74). There are a few new subcentimeter foci of diffusion signal abnormality involving the contralateral right frontal lobe (series 2, image 43) as well as the right midbrain/pons (series 2, image 21). Findings are suspicious for additional small acute to early subacute ischemic infarcts. No associated hemorrhage or mass effect. Gray-white matter differentiation otherwise maintained. No acute intracranial hemorrhage. Multiple scattered  chronic micro hemorrhages noted, presumably hypertensive in nature. No mass lesion, midline shift or mass effect. No hydrocephalus or extra-axial fluid collection. Pituitary gland suprasellar region within normal limits. Vascular: Loss of normal flow void within the intradural right V4 segment beyond the takeoff of the right PICA, which could reflect slow flow and/or occlusion, stable. Major intracranial vascular flow voids are otherwise maintained. Dolichoectatic appearance of the intracranial circulation noted. Skull and upper cervical spine: Craniocervical junction within normal limits. Bone marrow signal intensity normal. No scalp soft tissue abnormality. Sinuses/Orbits: Prior bilateral ocular lens replacement. Paranasal sinuses are clear. No significant mastoid effusion. Other: None. IMPRESSION: 1. Normal expected interval evolution of recently identified left posterior cerebral infarct. Minimal associated petechial blood products without hemorrhagic transformation or mass effect. 2. Few new subcentimeter acute to early subacute ischemic infarcts involving the contralateral right frontal lobe and right midbrain/pons as above. No associated hemorrhage or mass effect. 3. Underlying advanced atrophy with chronic small vessel ischemic disease with multiple chronic infarcts as above. Electronically Signed   By: Rise Mu M.D.   On: 02/12/2023 23:06   CT HEAD WO CONTRAST ( )  Result Date: 02/12/2023 CLINICAL DATA:  New onset headache EXAM: CT HEAD WITHOUT  CONTRAST TECHNIQUE: Contiguous axial images were obtained from the base of the skull through the vertex without intravenous contrast. RADIATION DOSE REDUCTION: This exam was performed according to the departmental dose-optimization program which includes automated exposure control, adjustment of the mA and/or kV according to patient size and/or use of iterative reconstruction technique. COMPARISON:  MRI head 01/24/2023 FINDINGS: Brain: No  intracranial hemorrhage, mass effect, or evidence of acute infarct. No hydrocephalus. No extra-axial fluid collection. Advanced cerebral atrophy and chronic small vessel ischemic disease. Cerebral atrophy. Chronic infarcts including in the bilateral occipital lobes, left temporal lobe and bilateral cerebellar hemispheres. Vascular: No hyperdense vessel. Intracranial arterial calcification. Skull: No fracture or focal lesion. Sinuses/Orbits: No acute finding. Other: None. IMPRESSION: 1. No evidence of acute intracranial abnormality. 2. Cerebral atrophy and chronic small vessel ischemic disease and multiple chronic infarcts. Electronically Signed   By: Minerva Fester M.D.   On: 02/12/2023 22:54   DG Chest Port 1 View  Result Date: 02/12/2023 CLINICAL DATA:  Nausea and vomiting EXAM: PORTABLE CHEST 1 VIEW COMPARISON:  12/06/2022 FINDINGS: Sternotomy and CABG. AVR. Stable cardiomediastinal silhouette. Aortic atherosclerotic calcification. No focal consolidation, pleural effusion, or pneumothorax. No displaced rib fractures. IMPRESSION: No acute cardiopulmonary disease. Electronically Signed   By: Minerva Fester M.D.   On: 02/12/2023 21:18    Procedures Procedures    Medications Ordered in ED Medications  iohexol (OMNIPAQUE) 350 MG/ML injection 50 mL (has no administration in time range)  ondansetron (ZOFRAN) injection 4 mg (4 mg Intravenous Given 02/12/23 2115)  sodium chloride 0.9 % bolus 1,000 mL (1,000 mLs Intravenous New Bag/Given 02/12/23 2218)  meclizine (ANTIVERT) tablet 25 mg (25 mg Oral Given 02/12/23 2115)  iohexol (OMNIPAQUE) 350 MG/ML injection 75 mL (75 mLs Intravenous Contrast Given 02/12/23 2231)    ED Course/ Medical Decision Making/ A&P                                 Medical Decision Making  This patient is a 72 y.o. male who presents to the ED for concern of dizziness and vomiting, this involves an extensive number of treatment options, and is a complaint that carries with it a high  risk of complications and morbidity. The emergent differential diagnosis prior to evaluation includes, but is not limited to, CVA,Infections,ICH . This is not an exhaustive differential.   Past Medical History / Co-morbidities / Social History: Recent CVA  On Apixaban  Lab Tests: I ordered, and personally interpreted labs.  The pertinent results include: Hypokalemic of 3.4 and WBC of 11.4   Imaging Studies: I ordered imaging studies including MRI Brain . I independently visualized and interpreted imaging which showed Few new subcentimeter acute to early subacute ischemic infarcts involving the contralateral right frontal lobe and right midbrain/pons. No associated hemorrhage or mass effect.  Dilated ileal small bowel segments in the right upper to mid abdomen with collapsed mid/lower small bowel consistent with intermediate to high-grade SBO.I agree with the radiologist interpretation.   Medications: I ordered medication including Zofran  for vomiting . Reevaluation of the patient after these medicines showed that the patient improved. I have reviewed the patients home medicines and have made adjustments as needed.  Disposition: After consideration of the diagnostic results and the patients response to treatment, I feel that the emergency department workup suggest an emergent condition requiring admission or immediate intervention beyond what has been performed at this time. The plan is to  admit for stroke work up. I put in the consult for admission and spoke to neurology as well who greed to see patient tonight. Dr Gasper Sells agreed to admit patient  stroke and SBO work up  I discussed this case with my attending physician Dr. Silverio Lay  who cosigned this note including patient's presenting symptoms, physical exam, and planned diagnostics and interventions. Attending physician stated agreement with plan or made changes to plan which were implemented.           Final Clinical Impression(s) / ED  Diagnoses Final diagnoses:  Cerebrovascular accident (CVA), unspecified mechanism [facility])    Rx / DC Orders ED Discharge Orders     None         Kathleen Lime, MD 02/12/23 2345    Charlynne Pander, MD 02/17/23 1816

## 2023-02-12 NOTE — ED Triage Notes (Signed)
Patient arrived via ems from home c/o n/v and dizziness was given 4mg  zofran via ems

## 2023-02-13 ENCOUNTER — Inpatient Hospital Stay (HOSPITAL_COMMUNITY): Payer: Medicare HMO

## 2023-02-13 ENCOUNTER — Emergency Department (HOSPITAL_COMMUNITY): Payer: Medicare HMO

## 2023-02-13 ENCOUNTER — Other Ambulatory Visit (HOSPITAL_COMMUNITY): Payer: Medicare HMO

## 2023-02-13 DIAGNOSIS — Z7985 Long-term (current) use of injectable non-insulin antidiabetic drugs: Secondary | ICD-10-CM | POA: Diagnosis not present

## 2023-02-13 DIAGNOSIS — Z91013 Allergy to seafood: Secondary | ICD-10-CM | POA: Diagnosis not present

## 2023-02-13 DIAGNOSIS — Z7901 Long term (current) use of anticoagulants: Secondary | ICD-10-CM | POA: Diagnosis not present

## 2023-02-13 DIAGNOSIS — E785 Hyperlipidemia, unspecified: Secondary | ICD-10-CM | POA: Diagnosis present

## 2023-02-13 DIAGNOSIS — I639 Cerebral infarction, unspecified: Secondary | ICD-10-CM

## 2023-02-13 DIAGNOSIS — Z86718 Personal history of other venous thrombosis and embolism: Secondary | ICD-10-CM

## 2023-02-13 DIAGNOSIS — Z23 Encounter for immunization: Secondary | ICD-10-CM | POA: Diagnosis present

## 2023-02-13 DIAGNOSIS — Z8673 Personal history of transient ischemic attack (TIA), and cerebral infarction without residual deficits: Secondary | ICD-10-CM | POA: Diagnosis not present

## 2023-02-13 DIAGNOSIS — K56609 Unspecified intestinal obstruction, unspecified as to partial versus complete obstruction: Secondary | ICD-10-CM | POA: Diagnosis present

## 2023-02-13 DIAGNOSIS — N184 Chronic kidney disease, stage 4 (severe): Secondary | ICD-10-CM | POA: Diagnosis present

## 2023-02-13 DIAGNOSIS — E876 Hypokalemia: Secondary | ICD-10-CM | POA: Insufficient documentation

## 2023-02-13 DIAGNOSIS — Z951 Presence of aortocoronary bypass graft: Secondary | ICD-10-CM | POA: Diagnosis not present

## 2023-02-13 DIAGNOSIS — I5042 Chronic combined systolic (congestive) and diastolic (congestive) heart failure: Secondary | ICD-10-CM | POA: Diagnosis present

## 2023-02-13 DIAGNOSIS — I447 Left bundle-branch block, unspecified: Secondary | ICD-10-CM | POA: Diagnosis present

## 2023-02-13 DIAGNOSIS — E1165 Type 2 diabetes mellitus with hyperglycemia: Secondary | ICD-10-CM | POA: Diagnosis present

## 2023-02-13 DIAGNOSIS — Z7982 Long term (current) use of aspirin: Secondary | ICD-10-CM | POA: Diagnosis not present

## 2023-02-13 DIAGNOSIS — N400 Enlarged prostate without lower urinary tract symptoms: Secondary | ICD-10-CM | POA: Diagnosis present

## 2023-02-13 DIAGNOSIS — Z952 Presence of prosthetic heart valve: Secondary | ICD-10-CM | POA: Diagnosis not present

## 2023-02-13 DIAGNOSIS — I130 Hypertensive heart and chronic kidney disease with heart failure and stage 1 through stage 4 chronic kidney disease, or unspecified chronic kidney disease: Secondary | ICD-10-CM | POA: Diagnosis present

## 2023-02-13 DIAGNOSIS — Z794 Long term (current) use of insulin: Secondary | ICD-10-CM | POA: Diagnosis not present

## 2023-02-13 DIAGNOSIS — Z8616 Personal history of COVID-19: Secondary | ICD-10-CM | POA: Diagnosis not present

## 2023-02-13 DIAGNOSIS — E669 Obesity, unspecified: Secondary | ICD-10-CM | POA: Diagnosis present

## 2023-02-13 DIAGNOSIS — I951 Orthostatic hypotension: Secondary | ICD-10-CM | POA: Diagnosis not present

## 2023-02-13 DIAGNOSIS — F0150 Vascular dementia without behavioral disturbance: Secondary | ICD-10-CM | POA: Diagnosis present

## 2023-02-13 DIAGNOSIS — Z79899 Other long term (current) drug therapy: Secondary | ICD-10-CM | POA: Diagnosis not present

## 2023-02-13 DIAGNOSIS — I6340 Cerebral infarction due to embolism of unspecified cerebral artery: Secondary | ICD-10-CM | POA: Diagnosis present

## 2023-02-13 DIAGNOSIS — Z7984 Long term (current) use of oral hypoglycemic drugs: Secondary | ICD-10-CM | POA: Diagnosis not present

## 2023-02-13 DIAGNOSIS — E1122 Type 2 diabetes mellitus with diabetic chronic kidney disease: Secondary | ICD-10-CM | POA: Diagnosis present

## 2023-02-13 DIAGNOSIS — I63532 Cerebral infarction due to unspecified occlusion or stenosis of left posterior cerebral artery: Secondary | ICD-10-CM | POA: Diagnosis not present

## 2023-02-13 LAB — GLUCOSE, CAPILLARY
Glucose-Capillary: 156 mg/dL — ABNORMAL HIGH (ref 70–99)
Glucose-Capillary: 138 mg/dL — ABNORMAL HIGH (ref 70–99)
Glucose-Capillary: 129 mg/dL — ABNORMAL HIGH (ref 70–99)
Glucose-Capillary: 166 mg/dL — ABNORMAL HIGH (ref 70–99)
Glucose-Capillary: 113 mg/dL — ABNORMAL HIGH (ref 70–99)

## 2023-02-13 LAB — APTT: aPTT: 70 s — ABNORMAL HIGH (ref 24–36)

## 2023-02-13 LAB — URINALYSIS, ROUTINE W REFLEX MICROSCOPIC
Bilirubin Urine: NEGATIVE
Glucose, UA: >=500 mg/dL — AB
Ketones, ur: NEGATIVE mg/dL
Nitrite: NEGATIVE
Protein, ur: 100 mg/dL — AB
Specific Gravity, Urine: 1.017 (ref 1.005–1.030)
pH: 5 (ref 5.0–8.0)

## 2023-02-13 MED ORDER — METOPROLOL TARTRATE 5 MG/5ML IV SOLN: 5.0000 mg | Freq: Four times a day (QID) | INTRAVENOUS | Status: DC | PRN

## 2023-02-13 MED ORDER — HEPARIN (PORCINE) 25000 UT/250ML-% IV SOLN
1300.0000 [IU]/h | INTRAVENOUS | Status: DC
  Administered 2023-02-13 – 2023-02-14 (×2): 1300 [IU]/h via INTRAVENOUS
  Filled 2023-02-13 (×2): qty 250

## 2023-02-13 MED ORDER — POTASSIUM CHLORIDE 2 MEQ/ML IV SOLN
INTRAVENOUS | Status: DC
  Filled 2023-02-13 (×5): qty 1000

## 2023-02-13 MED ORDER — LACTATED RINGERS IV SOLN: INTRAVENOUS | Status: DC

## 2023-02-13 MED ORDER — SODIUM CHLORIDE 0.9 % IV SOLN: 12.5000 mg | Freq: Four times a day (QID) | INTRAVENOUS | Status: DC | PRN

## 2023-02-13 MED ORDER — DIATRIZOATE MEGLUMINE & SODIUM 66-10 % PO SOLN
90.0000 mL | Freq: Once | ORAL | Status: AC
  Administered 2023-02-13: 90 mL via NASOGASTRIC
  Filled 2023-02-13: qty 90

## 2023-02-13 MED ORDER — ORAL CARE MOUTH RINSE: 15.0000 mL | OROMUCOSAL | Status: DC | PRN

## 2023-02-13 MED ORDER — INSULIN ASPART 100 UNIT/ML IJ SOLN
0.0000 [IU] | INTRAMUSCULAR | Status: DC
  Administered 2023-02-13: 2 [IU] via SUBCUTANEOUS
  Administered 2023-02-13 – 2023-02-14 (×3): 1 [IU] via SUBCUTANEOUS
  Administered 2023-02-14: 2 [IU] via SUBCUTANEOUS
  Administered 2023-02-14: 3 [IU] via SUBCUTANEOUS
  Administered 2023-02-14: 1 [IU] via SUBCUTANEOUS

## 2023-02-13 MED ORDER — SODIUM CHLORIDE 0.9 % IV BOLUS
500.0000 mL | Freq: Once | INTRAVENOUS | Status: AC
  Administered 2023-02-13: 500 mL via INTRAVENOUS

## 2023-02-13 MED ORDER — STROKE: EARLY STAGES OF RECOVERY BOOK
Freq: Once | Status: AC
  Filled 2023-02-13: qty 1

## 2023-02-13 MED ORDER — ONDANSETRON HCL 4 MG/2ML IJ SOLN
4.0000 mg | Freq: Four times a day (QID) | INTRAMUSCULAR | Status: DC | PRN
  Administered 2023-02-13 – 2023-02-14 (×2): 4 mg via INTRAVENOUS
  Filled 2023-02-13 (×3): qty 2

## 2023-02-13 NOTE — Evaluation (Signed)
Physical Therapy Evaluation Patient Details Name: Shane Holloway MRN: [id number] DOB: 18-Jan-1951 Today's Date: 02/13/2023  History of Present Illness  72 y.o. male presented to [facility] ED on 9/4 with dizziness. Found to have multiple strokes in multiple different vascular territories. PMHx: recent CVA, HTN, HLD, chronic systolic/diastolic congestive heart failure, CKD stage IV, CAD s/p CABG, DM2, history of SVT, history of DVT on Eliquis, history of CVA, history of aortic valve repair, obesity.  Clinical Impression  Pt admitted with above diagnosis. Limited evaluation due to onset of frequent vomiting after sitting on EOB.Patient complains of vertigo that is constant Pt currently with functional limitations due to the deficits listed below (see PT Problem List). RLE knee extension and dorsiflexion strength 4/5. Required min assist to bring LEs back into bed. Repositioned for comfort, HOB elevated. Pt will benefit from acute skilled PT to increase their independence and safety with mobility to allow discharge.    + Rt beating unilateral nystagmus, abnormal saccades with tracking. + nausea and vomiting with subjective vertigo that is constant. Denies aural fullness or tinnitus, changes to hearing or vision but noted to have some difficulty initiating and sustaining gaze towards Rt. Unable to conduct further vestibular assessment and physical funcitoning due to N&V.          If plan is discharge home, recommend the following: A lot of help with walking and/or transfers;Assistance with cooking/housework;Direct supervision/assist for medications management;Direct supervision/assist for financial management;Assist for transportation;Help with stairs or ramp for entrance;A lot of help with bathing/dressing/bathroom     Equipment Recommendations None recommended by PT  Recommendations for Other Services    TBD - will update next visit.   Functional Status Assessment Patient has had a recent  decline in their functional status and demonstrates the ability to make significant improvements in function in a reasonable and predictable amount of time.     Precautions / Restrictions Precautions Precautions: Fall Precaution Comments: N&V Restrictions Weight Bearing Restrictions: No      Mobility  Bed Mobility Overal bed mobility: Needs Assistance Bed Mobility: Supine to Sit, Sit to Supine     Supine to sit: Used rails, Contact guard, HOB elevated Sit to supine: Min assist   General bed mobility comments: CGA to rise to EOB, became nauseous and is vomiting. Unable to stand. Scoots slightly to EOB with cues. Min assist for LE support back to bed.    Transfers                   General transfer comment: Deferred due to N&V    Ambulation/Gait                  Stairs            Wheelchair Mobility     Tilt Bed    Modified Rankin (Stroke Patients Only) Modified Rankin (Stroke Patients Only) Pre-Morbid Rankin Score: Slight disability Modified Rankin: Moderately severe disability     Balance Overall balance assessment: Needs assistance Sitting-balance support: No upper extremity supported, Feet supported Sitting balance-Leahy Scale: Fair                                       Pertinent Vitals/Pain Pain Assessment Pain Assessment: No/denies pain    Home Living Family/patient expects to be discharged to:: Private residence Living Arrangements: Alone Available Help at Discharge: Personal care attendant;Family;Available PRN/intermittently (brother and  nephew live nearby; has paid caregiver approx. 2x per week to assist with IADLs and transportation) Type of Home: Mobile home Home Access: Ramped entrance       Home Layout: One level Home Equipment: Agricultural consultant (2 wheels);Cane - single point;Wheelchair - Lawyer Comments: States he went to rehab after d/c from hosptial last month and recently  returned home with HHPT. Has been ambulating without assistive device and is independent with ADLs.    Prior Function Prior Level of Function : Needs assist;History of Falls (last six months)             Mobility Comments: Reports he has been with working with HHPT and has progressed to a level of independence at home where he no longer uses ambulatory equipment. ADLs Comments: Caretaking approx. 2x per week who assists with IADLs and transportation. Independent with ADLs.     Extremity/Trunk Assessment   Upper Extremity Assessment Upper Extremity Assessment: Defer to OT evaluation    Lower Extremity Assessment Lower Extremity Assessment: RLE deficits/detail RLE Deficits / Details: Rt knee extension 4/5, Rt ankle DF 4/5 RLE:  (Unable to fully assess due to N&V)       Communication   Communication Communication: No apparent difficulties Cueing Techniques: Verbal cues;Gestural cues  Cognition Arousal: Alert Behavior During Therapy: WFL for tasks assessed/performed Overall Cognitive Status: No family/caregiver present to determine baseline cognitive functioning                                 General Comments: Limited assessment with N&V        General Comments General comments (skin integrity, edema, etc.): + Rt beating nystagmus, abnormal saccades with tracking. + nausea and vomiting with reported vertigo that is constant. Denies aural fullness or tinnitus, changes to hearing or vision. Unable to conduct further vestibular assessment due to N&V.    Exercises     Assessment/Plan    PT Assessment Patient needs continued PT services  PT Problem List Decreased strength;Decreased range of motion;Decreased activity tolerance;Decreased balance;Decreased mobility;Decreased coordination;Decreased cognition;Decreased knowledge of use of DME;Decreased safety awareness;Decreased knowledge of precautions;Impaired sensation;Impaired tone       PT Treatment  Interventions DME instruction;Gait training;Functional mobility training;Therapeutic activities;Therapeutic exercise;Balance training;Neuromuscular re-education;Cognitive remediation;Patient/family education;Wheelchair mobility training;Modalities    PT Goals (Current goals can be found in the Care Plan section)  Acute Rehab PT Goals Patient Stated Goal: Get well PT Goal Formulation: With patient Time For Goal Achievement: 02/27/23 Potential to Achieve Goals: Good    Frequency Min 1X/week     Co-evaluation               AM-PAC PT "6 Clicks" Mobility  Outcome Measure Help needed turning from your back to your side while in a flat bed without using bedrails?: None Help needed moving from lying on your back to sitting on the side of a flat bed without using bedrails?: A Little Help needed moving to and from a bed to a chair (including a wheelchair)?: A Lot Help needed standing up from a chair using your arms (e.g., wheelchair or bedside chair)?: A Lot Help needed to walk in hospital room?: A Lot Help needed climbing 3-5 steps with a railing? : Total 6 Click Score: 14    End of Session Equipment Utilized During Treatment: Gait belt Activity Tolerance: Other (comment) (Limited by nausea and vomiting) Patient left: with call bell/phone within reach;in bed;with bed alarm  set [facility] elevated >30 deg due to vomiting)   PT Visit Diagnosis: Hemiplegia and hemiparesis;History of falling (Z91.81);Difficulty in walking, not elsewhere classified (R26.2);Dizziness and giddiness (R42);Other symptoms and signs involving the nervous system (R29.898) Hemiplegia - Right/Left: Right Hemiplegia - caused by: Cerebral infarction    Time: 8299-3716 PT Time Calculation (min) (ACUTE ONLY): 14 min   Charges:   PT Evaluation $PT Eval Moderate Complexity: 1 Mod   PT General Charges $$ ACUTE PT VISIT: 1 Visit         Kathlyn Sacramento, PT, DPT [facility]  Rehabilitation Services Physical  Therapist Office: [phone] Website: [email or web address]   Berton Mount 02/13/2023, 11:41 AM

## 2023-02-13 NOTE — Hospital Course (Addendum)
Shane Holloway is a 72 y.o. M with sdCHF EF recovered to 55-60%, CKD IV baseline 2.8, CAD s/p CABG and AV repair, DM, hx SVT, hx DVT on Eliquis, and recent admission for stroke in the setting of holding Eliquis due to dental bleeding who presented with vomiting and abdominal pain.  In the ER, CT showed SBO.  Also found incidentally to have new embolic appearing strokes.

## 2023-02-13 NOTE — Progress Notes (Addendum)
STROKE TEAM PROGRESS NOTE   BRIEF HPI [name] is a 72 y.o. 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 Patient had new onset vomiting at church yesterday. Presented to the ED which was significant for SBO. No new focal findings.   SIGNIFICANT HOSPITAL EVENTS 9/4: MRI Brain: normal expected evolution of left posterior cerebral infarct. Few new subcenitmeter acute to early subacute ischemic infarcts involving the contralateral right frontal lobe and right midbrain/pons. No hemorrhage/mass effect. Began on heparin prophylaxis. NGT placed. 9/5: MRA head and neck carried out. Ordered VAS [place] Transcranial Doppler w Bubbles  INTERIM HISTORY/SUBJECTIVE  NAEON. No refusals or PRNs. Patient appears at baseline with some slurring and confusion but no new focal findings not previously described. Able to recount history as above. Some dizziness remains. Has been vomiting all morning. No acute distress.   OBJECTIVE  CBC    Component Value Date/Time   WBC 11.4 (H) 02/12/2023 2034   RBC 4.69 02/12/2023 2034   HGB 13.4 02/12/2023 2034   HCT 40.9 02/12/2023 2034   PLT 133 (L) 02/12/2023 2034   MCV 87.2 02/12/2023 2034   MCV 89.1 10/21/2012 1405   MCH 28.6 02/12/2023 2034   MCHC 32.8 02/12/2023 2034   RDW 14.3 02/12/2023 2034   LYMPHSABS 1.1 01/24/2023 1040   MONOABS 0.5 01/24/2023 1040   EOSABS 0.2 01/24/2023 1040   BASOSABS 0.1 01/24/2023 1040    BMET    Component Value Date/Time   NA 141 02/12/2023 2034   K 3.4 (L) 02/12/2023 2034   CL 109 02/12/2023 2034   CO2 19 (L) 02/12/2023 2034   GLUCOSE 230 (H) 02/12/2023 2034   BUN 38 (H) 02/12/2023 2034   CREATININE 3.24 (H)  02/12/2023 2034   CREATININE 0.79 09/23/2011 1201   CALCIUM 9.0 02/12/2023 2034   GFRNONAA 19 (L) 02/12/2023 2034    IMAGING past 24 hours DG Abdomen 1 View  Result Date: 02/13/2023 CLINICAL DATA:  NG tube placement EXAM: ABDOMEN - 1 VIEW COMPARISON:  None Available. FINDINGS: NG tube in place with the tip in the fundus of the stomach. Prior CABG and valve replacement. No confluent airspace opacity. IMPRESSION: NG tube in the stomach. Electronically Signed   By: Charlett Nose M.D.   On: 02/13/2023 00:32   CT ABDOMEN PELVIS W CONTRAST  Result Date: 02/12/2023 CLINICAL DATA:  Abdominal pain. EXAM: CT ABDOMEN AND PELVIS WITH CONTRAST TECHNIQUE: Multidetector CT imaging of the abdomen and pelvis was performed using the standard protocol following bolus administration of intravenous contrast. RADIATION DOSE REDUCTION: This exam was performed according to the departmental dose-optimization program which includes automated exposure control, adjustment of the mA and/or kV according to patient size and/or use of iterative reconstruction technique. CONTRAST:  75mL OMNIPAQUE IOHEXOL 350 MG/ML SOLN COMPARISON:  None Available. FINDINGS: Lower chest: Moderate elevation noted right hemidiaphragm. Lung bases are clear of infiltrates. There are sternotomy sutures and CABG changes with heavy native CAD. No pericardial effusion. There is a metallic aortic valve prosthesis. The aortic root is dilated at the sinuses of Valsalva where it measures 4.6 cm on 7:80. Surveillance imaging will be needed. The descending aorta is heavily calcified. There is a small hiatal hernia. Hepatobiliary:  The liver is 18.3 cm in length and mildly steatotic. No mass enhancement is seen. Gallbladder surgically absent with dilatation of the common bile duct measuring 12 mm. There is no intrahepatic biliary dilatation or calcified ductal filling defect. Pancreas: No abnormality. Spleen: Mildly enlarged, 14.8 cm AP.  No mass. Adrenals/Urinary Tract:  There is no adrenal mass. There is an irregular 3 mm nonobstructive caliceal stone in the lower pole of the left kidney, occasional few punctate nonobstructive caliceal stones on the right. There is no obstructing urinary stone or hydronephrosis. There is a 2 cm Bosniak 1 homogeneous cyst in the posterior left kidney, Hounsfield density is 0.7. There is a 1.2 cm Bosniak 1 cyst in the superior pole right kidney, Hounsfield density is -8.5. No follow-up imaging is recommended. There are additional scattered subcentimeter too small to characterize hypodensities in both kidneys consistent with Bosniak 2 cyst. No follow-up imaging is recommended. The bladder thickness is normal. Enlarged prostate impresses into the posterior bladder. Stomach/Bowel: Unremarkable stomach apart from a small hiatal hernia. Normal caliber duodenum and jejunum. There are dilated ileal small bowel segments in the right upper to mid abdomen. The mid/lower abdominal small bowel is collapsed. Maximum small bowel caliber is 3.6 cm. The exact transition point was not found. Etiology could be enteritis, occult adhesions or occult internal hernia. The appendix is normal and well seen. There is mild fecal stasis. Scattered diverticula without evidence of diverticulitis. Vascular/Lymphatic: Heavy aortoiliac and visceral branch vessel atherosclerosis. No AAA. No lymphadenopathy is seen. Reproductive: Enlarged prostate, measures 5.2 cm transverse. Bilateral pelvic phleboliths. Other: Small umbilical and bilateral inguinal fat hernias. No incarcerated hernia. No free hemorrhage, free fluid or free air. No bowel pneumatosis is seen. Musculoskeletal: Degenerative change lumbar spine. Degenerative disc disease thoracic spine. Mild osteopenia. No acute or other significant osseous findings. IMPRESSION: 1. Dilated ileal small bowel segments in the right upper to mid abdomen with collapsed mid/lower small bowel consistent with intermediate to high-grade SBO. The  exact transition point was not found. Etiology could be enteritis, occult adhesions or occult internal hernia. 2. Constipation and diverticulosis. 3. Prostatomegaly. 4. Aortic atherosclerosis with dilated aortic root at the sinuses of Valsalva where it measures 4.6 cm. Surveillance imaging will be needed. Recommend semi-annual imaging followup by CTA or MRA and referral to cardiothoracic surgery if not already obtained. This recommendation follows 2010 ACCF/AHA/AATS/ACR/ ASA/SCA/SCAI/SIR/STS/SVM Guidelines for the Diagnosis and Management of Patients With Thoracic Aortic Disease. Circulation. 2010; 121: Z610-R604. Aortic aneurysm NOS (ICD10-I71.9). 5. Mild hepatic steatosis. 6. Mild splenomegaly. 7. Nonobstructive nephrolithiasis. 8. Umbilical and inguinal fat hernias. Aortic Atherosclerosis (ICD10-I70.0). Electronically Signed   By: Almira Bar M.D.   On: 02/12/2023 23:15   MR BRAIN WO CONTRAST  Result Date: 02/12/2023 CLINICAL DATA:  Initial evaluation for acute TIA, right lower extremity weakness. EXAM: MRI HEAD WITHOUT CONTRAST TECHNIQUE: Multiplanar, multiecho pulse sequences of the brain and surrounding structures were obtained without intravenous contrast. COMPARISON:  Prior brain MRI from 01/24/2023. FINDINGS: Brain: Advanced cerebral atrophy for age. Extensive chronic microvascular ischemic disease involving the supratentorial cerebral white matter. Multiple scattered remote lacunar infarcts present about the hemispheric cerebral white matter, deep gray nuclei, and pons. Multiple chronic bilateral cerebellar infarcts noted. Small remote bilateral occipital infarcts. There has been interval evolution of previously identified left ACA distribution infarct involving the left perirolandic cortex, less conspicuous as compared to previous (series 2, images 41, 39). Minimal associated petechial blood products without hemorrhagic transformation or mass effect (series 8, image 74). There  are a few new  subcentimeter foci of diffusion signal abnormality involving the contralateral right frontal lobe (series 2, image 43) as well as the right midbrain/pons (series 2, image 21). Findings are suspicious for additional small acute to early subacute ischemic infarcts. No associated hemorrhage or mass effect. Gray-white matter differentiation otherwise maintained. No acute intracranial hemorrhage. Multiple scattered chronic micro hemorrhages noted, presumably hypertensive in nature. No mass lesion, midline shift or mass effect. No hydrocephalus or extra-axial fluid collection. Pituitary gland suprasellar region within normal limits. Vascular: Loss of normal flow void within the intradural right V4 segment beyond the takeoff of the right PICA, which could reflect slow flow and/or occlusion, stable. Major intracranial vascular flow voids are otherwise maintained. Dolichoectatic appearance of the intracranial circulation noted. Skull and upper cervical spine: Craniocervical junction within normal limits. Bone marrow signal intensity normal. No scalp soft tissue abnormality. Sinuses/Orbits: Prior bilateral ocular lens replacement. Paranasal sinuses are clear. No significant mastoid effusion. Other: None. IMPRESSION: 1. Normal expected interval evolution of recently identified left posterior cerebral infarct. Minimal associated petechial blood products without hemorrhagic transformation or mass effect. 2. Few new subcentimeter acute to early subacute ischemic infarcts involving the contralateral right frontal lobe and right midbrain/pons as above. No associated hemorrhage or mass effect. 3. Underlying advanced atrophy with chronic small vessel ischemic disease with multiple chronic infarcts as above. Electronically Signed   By: Rise Mu M.D.   On: 02/12/2023 23:06   CT HEAD WO CONTRAST ( )  Result Date: 02/12/2023 CLINICAL DATA:  New onset headache EXAM: CT HEAD WITHOUT CONTRAST TECHNIQUE: Contiguous axial  images were obtained from the base of the skull through the vertex without intravenous contrast. RADIATION DOSE REDUCTION: This exam was performed according to the departmental dose-optimization program which includes automated exposure control, adjustment of the mA and/or kV according to patient size and/or use of iterative reconstruction technique. COMPARISON:  MRI head 01/24/2023 FINDINGS: Brain: No intracranial hemorrhage, mass effect, or evidence of acute infarct. No hydrocephalus. No extra-axial fluid collection. Advanced cerebral atrophy and chronic small vessel ischemic disease. Cerebral atrophy. Chronic infarcts including in the bilateral occipital lobes, left temporal lobe and bilateral cerebellar hemispheres. Vascular: No hyperdense vessel. Intracranial arterial calcification. Skull: No fracture or focal lesion. Sinuses/Orbits: No acute finding. Other: None. IMPRESSION: 1. No evidence of acute intracranial abnormality. 2. Cerebral atrophy and chronic small vessel ischemic disease and multiple chronic infarcts. Electronically Signed   By: Minerva Fester M.D.   On: 02/12/2023 22:54   DG Chest Port 1 View  Result Date: 02/12/2023 CLINICAL DATA:  Nausea and vomiting EXAM: PORTABLE CHEST 1 VIEW COMPARISON:  12/06/2022 FINDINGS: Sternotomy and CABG. AVR. Stable cardiomediastinal silhouette. Aortic atherosclerotic calcification. No focal consolidation, pleural effusion, or pneumothorax. No displaced rib fractures. IMPRESSION: No acute cardiopulmonary disease. Electronically Signed   By: Minerva Fester M.D.   On: 02/12/2023 21:18    Vitals:   02/13/23 0100 02/13/23 0131 02/13/23 0250 02/13/23 0932  BP: 137/89 (!) 166/94 (!) 170/104 (!) 179/92  Pulse: 69 72 71 72  Resp:  16 18 18   Temp:  97.8 F (36.6 C) 98.5 F (36.9 C) (!) 97.5 F (36.4 C)  TempSrc:  Oral Oral Oral  SpO2: 93% 96% 97% 97%  Weight:      Height:         PHYSICAL EXAM General:  Alert, well-nourished, well-developed patient in  no acute distress Psych:  Mood and affect appropriate for situation CV: Regular rate and rhythm on monitor Respiratory:  Regular, unlabored respirations on room air GI: Abdomen soft and nontender   NEURO:  Mental Status: AA&Ox3, patient is able to give clear and coherent history Speech/Language: speech is largely intact.  Naming, repetition, fluency, and comprehension intact.  Cranial Nerves:  II:  Visual fields full.  III, IV, VI: EOMI. Eyelids elevate symmetrically.  V: Sensation is intact to light touch and symmetrical to face.  VII: Face is symmetrical resting and smiling VIII: hearing intact to voice. IX, X: Palate elevates symmetrically. Phonation is somewhat slurred [id number] shrug 5/5. XII: tongue is midline without fasciculations. Motor: 4/5 proximal strength in RLE, 5/5 strength to all other muscle groups tested.  Tone: is normal and bulk is normal Sensation: decreased in RLE, intact to light touch bilaterally otherwise. Extinction absent to light touch to DSS.   Coordination: FTN intact bilaterally, HKS: no ataxia in BLE.No drift.  Gait: deferred   ASSESSMENT/PLAN  Sudden-onset nausea/vomiting ISO recent L posterior cerebral infarct and new CVAs to contralateral R frontal lobe, pons, midbrain  Etiology:  Uncontrolled hypertension +/- suspected Afib or paradoxical embolli  Neurological exam largely non-focal. Believe source of N/V likely 2/2 SBO etiology rather than as a result of new stroke findings. Awaitng MRA H/N. Ordered VAS [place] transcranial doppler with bubbles. If unremarkable, may need 30-day holter monitor.   CT head: No acute abnormality. Cerebral atrophy and chronic small vessel ischemic disease and multiple chronic infarcts. CT A/P w/contrast: Intermediate-high grade SBO  MRI Brain: New sub-centimeter acute/early subacute ischemic infarcts in contralateral right frontal lobe and right midbrain/pons. Advanced atrophy. MRA H/N without contrast:  VAS [place]  Transcranial Doppler w Bubbles: ordered, awaiting results. 2D Echo (8/17): No intracardiac source of embolism detected. LDL 29 HgbA1c 8.8 VTE prophylaxis - IV Heparin drip, DVTs, holding home eliquis, per surgery recs Therapy recommendations: TBD, PT/OT physical exam limited by N/V Disposition:  TBD  Concern for Atrial fibrillation vs paradoxical emboli ISO DVT hx Recommend 30-day Holter monitor if VAS [place] transcranial doppler w/bubbles rules out paradoxical emboli Home Meds: Eliquis 5 BID, aspirin 81 mg Continue telemetry monitoring Continue IV heparin drip, DVTs.   Hx of Stroke/TIA Started Eliquis 12/2020 for left internal jugular and subclavian DVT. Hematology:  Recently admitted to [facility] hospital (8/16-8/19) for stroke ISO Eliquis stoppage.  Hypertension Home meds: Entresto 49-51 BID, metoprolol succinate 50 mg Elevated, stable Blood Pressure Goal: BP less than 220/110   Hx of SVT Home Amidarone 200 mg po QD  Hyperlipidemia Home meds:  ezitimibe 10 mg every day  LDL 29, goal < 70  Diabetes type II Uncontrolled Home meds:  Levemir 12u at bedtime, NPH 10u BID, Semaglutide 1mg /wk, empagliflozin 10 mg every day  HgbA1c 8.8, goal < 7.0 CBGs Sensitive SSI Recommend close follow-up with PCP for better DM control  Other Stroke Risk Factors Coronary artery disease Advanced age  Hospital day # 0  I have personally obtained history,examined this patient, reviewed notes, independently viewed imaging studies, participated in medical decision making and plan of care.ROS completed by me personally and pertinent positives fully documented  I have made any additions or clarifications directly to the above note. Agree with note above.  Patient presented with small bowel obstruction with nausea vomiting with no clear focal lateralizing neurological symptoms but MRI scan shows bilateral multiple small cortical and brainstem infarcts likely from central cardiac origin.  Given findings of acute  DVT in the right jugular vein possibility of paradoxical embolism during vomiting is raised hence we will check TCD  bubble study to look for right-to-left shunt.  Continue IV heparin till patient is able to swallow and move his bowels then switch to Eliquis for his A-fib.  No family available at the bedside.  Discussed with patient and Dr. Maryfrances Bunnell.  Greater than 50% time during this 50-minute visit was spent in counseling and coordination of care about his multiple embolic strokes and discussion about possible paroxysmal embolism and A-fib and need for anticoagulation and answering questions  Delia Heady, MD Medical Director [facility] Pager: [phone] 02/13/2023 2:15 PM   To contact Stroke Continuity provider, please refer to [email or web address]. After hours, contact General Neurology

## 2023-02-13 NOTE — Progress Notes (Signed)
ANTICOAGULATION CONSULT NOTE    Pharmacy Consult for heparin Indication: hx DVT  Allergies  Allergen Reactions   Altace [Ramipril] Cough   Shellfish Allergy Nausea And Vomiting   Sulfa Antibiotics Hives    Patient Measurements: Height: 6\' 2"  (188 cm) Weight: 100 kg (220 lb 7.4 oz) IBW/kg (Calculated) : 82.2 Heparin Dosing Weight: 100kg  Vital Signs: Temp: 98.3 F (36.8 C) (09/05 1958) Temp Source: Oral (09/05 1958) BP: 171/93 (09/05 1958) Pulse Rate: 72 (09/05 1958)  Labs: Recent Labs    02/12/23 2034 02/12/23 2036 02/13/23 1929  HGB 13.4  --   --   HCT 40.9  --   --   PLT 133*  --   --   APTT  --  30 70*  CREATININE 3.24*  --   --     Estimated Creatinine Clearance: 25.3 mL/min (A) (by C-G formula based on SCr of 3.24 mg/dL (H)).   Medical History: Past Medical History:  Diagnosis Date   Acute on chronic combined systolic and diastolic CHF (congestive heart failure) (HCC) 11/24/2008   Qualifier: Diagnosis of   By: Johnnette Barrios, CNA, Sandy         Acute renal failure with acute tubular necrosis superimposed on stage 3b chronic kidney disease (HCC) 12/15/2021   Allergy    Aortic stenosis    Arthritis    hands   BPH (benign prostatic hyperplasia)    CHF (congestive heart failure) (HCC)    CKD (chronic kidney disease)    COVID-19    COVID-19 virus infection 12/16/2021   Diabetes mellitus    Dyspnea    DYSPNEA 11/24/2008   Qualifier: Diagnosis of   By: Johnnette Barrios, CNA, Sandy         Hyperlipidemia    Hypertension    Hypertensive urgency 12/10/2021   Myocardial infarction [facility]) 06/10/2002   NSTEMI (non-ST elevated myocardial infarction) (HCC) 12/10/2021   Stroke (HCC) 09/2020   Some memory deficits   Vertigo    Wears dentures    full upper      Assessment: 72 yo M on apixaban PTA for hx LIJ DVT 11/2020. Last dose of apixaban 9/4 AM. Patient admitted for SBO and new CVA, holding apixaban until return of bowel function. Pharmacy consulted for heparin.     aPTT therapeutic at 70 seconds. No issues with heparin infusing or bleeding noted w/ RN.   Goal of Therapy:   Heparin level 0.3 -0.5 units/hr aPTT 66-85 seconds Monitor platelets by anticoagulation protocol: Yes   Plan:  Continue Heparin 1300 units/hr F/u aPTT until correlates with heparin level  Monitor daily aPTT, heparin level, CBC, signs/symptoms of bleeding  F/u restart apixaban  Rexford Maus, PharmD, BCPS 02/13/2023 8:04 PM

## 2023-02-13 NOTE — Assessment & Plan Note (Addendum)
Incidental finding.  MRI shows multiple strokes in different vascular territories, suspect central embolic source - Non-invasive angiography showed no clear source, no clinically significant carotid disease - Recent echo unremarkable, Neurology recommended not repeating - TCD and echo bubble recently done as well and were both negative - Lipids done recently, patient is on Lipitor 80 and Zetia - Aspirin not indicated, currently on chronic Apixaban - Evaluation for arrhythmia/atrial fibrillation: none on monitoring - tPA not given because outside window - Dysphagia screen ordered in ER - PT eval ordered: CIR - Nonsmoker

## 2023-02-13 NOTE — Progress Notes (Signed)
NGT clamped x 1 hr after gastrografin administration. No emesis during clamping. Pt assessed and returned to [facility].

## 2023-02-13 NOTE — Plan of Care (Signed)
  Problem: Education: Goal: Knowledge of disease or condition will improve Outcome: Progressing Goal: Knowledge of secondary prevention will improve (MUST DOCUMENT ALL) Outcome: Progressing Goal: Knowledge of patient specific risk factors will improve (Mark N/A or DELETE if not current risk factor) Outcome: Progressing   Problem: Ischemic Stroke/TIA Tissue Perfusion: Goal: Complications of ischemic stroke/TIA will be minimized Outcome: Progressing   

## 2023-02-13 NOTE — Progress Notes (Addendum)
ANTICOAGULATION CONSULT NOTE    Pharmacy Consult for heparin Indication: hx DVT  Allergies  Allergen Reactions   Altace [Ramipril] Cough   Shellfish Allergy Nausea And Vomiting   Sulfa Antibiotics Hives    Patient Measurements: Height: 6\' 2"  (188 cm) Weight: 100 kg (220 lb 7.4 oz) IBW/kg (Calculated) : 82.2 Heparin Dosing Weight: 100kg  Vital Signs: Temp: 98.5 F (36.9 C) (09/05 0250) Temp Source: Oral (09/05 0250) BP: 170/104 (09/05 0250) Pulse Rate: 71 (09/05 0250)  Labs: Recent Labs    02/12/23 2034 02/12/23 2036  HGB 13.4  --   HCT 40.9  --   PLT 133*  --   APTT  --  30  CREATININE 3.24*  --     Estimated Creatinine Clearance: 25.3 mL/min (A) (by C-G formula based on SCr of 3.24 mg/dL (H)).   Medical History: Past Medical History:  Diagnosis Date   Acute on chronic combined systolic and diastolic CHF (congestive heart failure) (HCC) 11/24/2008   Qualifier: Diagnosis of   By: Johnnette Barrios, CNA, Sandy         Acute renal failure with acute tubular necrosis superimposed on stage 3b chronic kidney disease (HCC) 12/15/2021   Allergy    Aortic stenosis    Arthritis    hands   BPH (benign prostatic hyperplasia)    CHF (congestive heart failure) (HCC)    CKD (chronic kidney disease)    COVID-19    COVID-19 virus infection 12/16/2021   Diabetes mellitus    Dyspnea    DYSPNEA 11/24/2008   Qualifier: Diagnosis of   By: Johnnette Barrios, CNA, Sandy         Hyperlipidemia    Hypertension    Hypertensive urgency 12/10/2021   Myocardial infarction [facility]) 06/10/2002   NSTEMI (non-ST elevated myocardial infarction) (HCC) 12/10/2021   Stroke (HCC) 09/2020   Some memory deficits   Vertigo    Wears dentures    full upper      Assessment: 72 yo M on apixaban PTA for hx LIJ DVT 11/2020. Last dose of apixaban 9/4 AM. Patient admitted for SBO and new CVA, holding apixaban until return of bowel function. Pharmacy consulted for heparin.     Goal of Therapy:   Heparin level 0.3  -0.5 units/hr aPTT 66-85 seconds Monitor platelets by anticoagulation protocol: Yes   Plan:  Heparin 1300 units/hr F/u 8hr APTT F/u aPTT until correlates with heparin level  Monitor daily aPTT, heparin level, CBC, signs/symptoms of bleeding  F/u restart apixaban  Alphia Moh, PharmD, BCPS, BCCP Clinical Pharmacist  Please check AMION for all [facility] Pharmacy phone numbers After 10:00 PM, call Main Pharmacy [phone]

## 2023-02-13 NOTE — Evaluation (Signed)
Occupational Therapy Evaluation Patient Details Name: Shane Holloway MRN: [id number] DOB: 10-14-1950 Today's Date: 02/13/2023   History of Present Illness 72 y.o. male presented to [facility] ED on 9/4 with dizziness. Found to have multiple strokes in multiple different vascular territories. PMHx: recent CVA, HTN, HLD, chronic systolic/diastolic congestive heart failure, CKD stage IV, CAD s/p CABG, DM2, history of SVT, history of DVT on Eliquis, history of CVA, history of aortic valve repair, vascular dementia.   Clinical Impression   Pt supine and agreeable to OT session on arrival. Coming to EOB with CGA for safety and increased time per encouragement of OT due to N&V in earlier PT session. Pt able to sit EOB and engage in BUE strength assessment with slightly decreased strength on R as compared to L. Decr overall coordination as well.  Engaged in reaching and pt with additional eye shifts as well as undershooting consistently. Performing 3x and significant nausea with pt self-initiating return to supine. Pt with decreased coordination, safety, awareness, vision. Endorsing dizziness/vertigo throughout session. At time of Eval, recommending skilled inpatient rehabilitation services <3 hours per day, however, session was limited due to nausea.       If plan is discharge home, recommend the following: Two people to help with walking and/or transfers;A lot of help with bathing/dressing/bathroom;Assistance with cooking/housework;Assistance with feeding;Direct supervision/assist for medications management;Direct supervision/assist for financial management;Assist for transportation;Help with stairs or ramp for entrance;Supervision due to cognitive status    Functional Status Assessment  Patient has had a recent decline in their functional status and demonstrates the ability to make significant improvements in function in a reasonable and predictable amount of time.  Equipment Recommendations   Other (comment) (defer next venue)    Recommendations for Other Services       Precautions / Restrictions Precautions Precautions: Fall Precaution Comments: N&V Restrictions Weight Bearing Restrictions: No      Mobility Bed Mobility Overal bed mobility: Needs Assistance Bed Mobility: Supine to Sit, Sit to Supine     Supine to sit: Used rails, Contact guard, HOB elevated Sit to supine: Contact guard assist   General bed mobility comments: CGA to and from EOB. Pt becoming nauseous with sitting EOB 2+ minutes and participating in strength assessment.    Transfers                   General transfer comment: deferred due to pt with max nausea and self-initiating return to supine.      Balance Overall balance assessment: Needs assistance Sitting-balance support: No upper extremity supported, Feet supported Sitting balance-Leahy Scale: Fair                                     ADL either performed or assessed with clinical judgement   ADL Overall ADL's : Needs assistance/impaired Eating/Feeding: Set up;Sitting   Grooming: Set up;Sitting   Upper Body Bathing: Minimal assistance;Cueing for safety;Cueing for sequencing;Sitting       Upper Body Dressing : Minimal assistance;Sitting                     General ADL Comments: session limited by ongoing nausea     Vision Baseline Vision/History: 1 Wears glasses (Pt reports he is "supposed to wear glasses" but doesn't) Ability to See in Adequate Light: 1 Impaired Patient Visual Report: No change from baseline Vision Assessment?: Vision impaired- to be further tested  in functional context Additional Comments: Pt observed to undershoot consistently this session with R > L sides during reaching. Pt with nystagmus with PT not observed in OT session, but formal visual assessment deferred this session due to pt significant nausea when looking up at therapist EOB and vomiting during PT session several  times. R and L supervior visual field deficits at last eval. Nausea with attempts to track today     Perception         Praxis         Pertinent Vitals/Pain Pain Assessment Pain Assessment: No/denies pain     Extremity/Trunk Assessment Upper Extremity Assessment Upper Extremity Assessment: Generalized weakness;RUE deficits/detail RUE Deficits / Details: Strength overall 4/5. However, dominant R UE strength decreased compared to L UE.   Lower Extremity Assessment Lower Extremity Assessment: Defer to PT evaluation       Communication Communication Communication: No apparent difficulties Cueing Techniques: Verbal cues;Gestural cues   Cognition Arousal: Alert Behavior During Therapy: WFL for tasks assessed/performed Overall Cognitive Status: No family/caregiver present to determine baseline cognitive functioning                                 General Comments: Not oriented to year or month, but recalls what brought him into the hospital. Appologizing to OT for driving reporting he knows he is not supposed to. Has assist with IADL at baseline. No caregiver or family present to determine clear baseline cognition     General Comments  constant vertigo worse with EOB and attempt to initiate any kind of visual assessment    Exercises     Shoulder Instructions      Home Living Family/patient expects to be discharged to:: Private residence Living Arrangements: Alone Available Help at Discharge: Personal care attendant;Family;Available PRN/intermittently Type of Home: Mobile home Home Access: Ramped entrance     Home Layout: One level     Bathroom Shower/Tub: Tub/shower unit;Walk-in shower   Bathroom Toilet: Standard     Home Equipment: Agricultural consultant (2 wheels);Cane - single point;Wheelchair - Sport and exercise psychologist Comments: States he went to rehab after d/c from hosptial last month and recently returned home with HHPT. Has been ambulating  without assistive device and is independent with ADLs.  Lives With: Alone    Prior Functioning/Environment Prior Level of Function : Needs assist;History of Falls (last six months)             Mobility Comments: Reports he has been with working with HHPT and has progressed to a level of independence at home where he no longer uses ambulatory equipment. ADLs Comments: Caretaking approx. 2x per week who assists with IADLs and transportation. Independent with ADLs.        OT Problem List: Decreased strength;Decreased activity tolerance;Impaired balance (sitting and/or standing);Impaired vision/perception;Decreased knowledge of use of DME or AE      OT Treatment/Interventions: Self-care/ADL training;Therapeutic exercise;DME and/or AE instruction;Therapeutic activities;Visual/perceptual remediation/compensation;Patient/family education;Balance training    OT Goals(Current goals can be found in the care plan section) Acute Rehab OT Goals Patient Stated Goal: no nausea OT Goal Formulation: With patient Time For Goal Achievement: 02/27/23 Potential to Achieve Goals: Good  OT Frequency: Min 1X/week    Co-evaluation              AM-PAC OT "6 Clicks" Daily Activity     Outcome Measure Help from another person eating meals?: A Little Help from  another person taking care of personal grooming?: A Little Help from another person toileting, which includes using toliet, bedpan, or urinal?: A Lot Help from another person bathing (including washing, rinsing, drying)?: A Lot Help from another person to put on and taking off regular upper body clothing?: A Little Help from another person to put on and taking off regular lower body clothing?: A Lot 6 Click Score: 15   End of Session Nurse Communication: Mobility status  Activity Tolerance: Treatment limited secondary to medical complications (Comment) (nausea) Patient left: in bed;with call bell/phone within reach;with bed alarm set  OT  Visit Diagnosis: Unsteadiness on feet (R26.81);Other abnormalities of gait and mobility (R26.89);History of falling (Z91.81);Other (comment)                Time: 9563-8756 OT Time Calculation (min): 20 min Charges:  OT General Charges $OT Visit: 1 Visit OT Evaluation $OT Eval Moderate Complexity: 1 Mod  Tyler Deis, OTR/L [facility] Acute Rehabilitation Office: [phone]   Myrla Halsted 02/13/2023, 3:47 PM

## 2023-02-13 NOTE — ED Notes (Signed)
ED TO INPATIENT HANDOFF REPORT  ED Nurse Name and Phone #: Pearletha Forge RN   S Name/Age/Gender Shane Holloway 72 y.o. male Room/Bed: 026C/026C  Code Status   Code Status: Full Code  Home/SNF/Other Home Patient oriented to: self, place, time, and situation Is this baseline? Yes   Triage Complete: Triage complete  Chief Complaint SBO (small bowel obstruction) (HCC) [K56.609]  Triage Note Patient arrived via ems from home c/o n/v and dizziness was given 4mg  zofran via ems   Allergies Allergies  Allergen Reactions   Altace [Ramipril] Cough   Shellfish Allergy Nausea And Vomiting   Sulfa Antibiotics Hives    Level of Care/Admitting Diagnosis ED Disposition     ED Disposition  Admit   Condition  --   Comment  Hospital Area: [facility] [[id number]]  Level of Care: Telemetry Medical [104]  May admit patient to [facility] or [facility] if equivalent level of care is available:: No  Covid Evaluation: Asymptomatic - no recent exposure (last 10 days) testing not required  Diagnosis: SBO (small bowel obstruction) [facility]) [[id number]]  Admitting Physician: Buena Irish [[id number]]  Attending Physician: Buena Irish [[id number]]  Certification:: I certify this patient will need inpatient services for at least 2 midnights  Expected Medical Readiness: 02/16/2023          B Medical/Surgery History Past Medical History:  Diagnosis Date   Acute on chronic combined systolic and diastolic CHF (congestive heart failure) (HCC) 11/24/2008   Qualifier: Diagnosis of   By: Johnnette Barrios, CNA, Sandy         Acute renal failure with acute tubular necrosis superimposed on stage 3b chronic kidney disease (HCC) 12/15/2021   Allergy    Aortic stenosis    Arthritis    hands   BPH (benign prostatic hyperplasia)    CHF (congestive heart failure) (HCC)    CKD (chronic kidney disease)    COVID-19    COVID-19 virus infection 12/16/2021   Diabetes mellitus    Dyspnea    DYSPNEA  11/24/2008   Qualifier: Diagnosis of   By: Johnnette Barrios, CNA, Sandy         Hyperlipidemia    Hypertension    Hypertensive urgency 12/10/2021   Myocardial infarction [facility]) 06/10/2002   NSTEMI (non-ST elevated myocardial infarction) (HCC) 12/10/2021   Stroke (HCC) 09/2020   Some memory deficits   Vertigo    Wears dentures    full upper   Past Surgical History:  Procedure Laterality Date   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  Redo of 2V CABG, Aortic Root repair and AVR   CATARACT EXTRACTION W/PHACO Right 11/19/2021   Procedure: CATARACT EXTRACTION PHACO AND INTRAOCULAR LENS PLACEMENT (IOC) RIGHT DIABETIC;  Surgeon: Nevada Crane, MD;  Location: [facility];  Service: Ophthalmology;  Laterality: Right;  7.32 0:54.3   CATARACT EXTRACTION W/PHACO Left 12/03/2021   Procedure: CATARACT EXTRACTION PHACO AND INTRAOCULAR LENS PLACEMENT (IOC) LEFT DIABETIC;  Surgeon: Estanislado Pandy, MD;  Location: [facility];  Service: Ophthalmology;  Laterality: Left;  LEAVE FIRST CASE Diabetic 3.74 00:36.3   CORONARY ARTERY BYPASS GRAFT  2009   x4; stents to diagonal a mid right coronary lesion; drug eluting stent in the mid RCA as well as ostium of the diagonal with a cutting balloon angioplasty of the diagonal.   CORONARY STENT INTERVENTION N/A 12/14/2021   Procedure: CORONARY STENT INTERVENTION;  Surgeon: Iran Ouch, MD;  Location: [facility] INVASIVE  CV LAB;  Service: Cardiovascular;  Laterality: N/A;   CORONARY ULTRASOUND/IVUS N/A 12/14/2021   Procedure: Intravascular Ultrasound/IVUS;  Surgeon: Iran Ouch, MD;  Location: [facility] INVASIVE CV LAB;  Service: Cardiovascular;  Laterality: N/A;   JOINT REPLACEMENT     RIGHT HEART CATH AND CORONARY/GRAFT ANGIOGRAPHY N/A 12/12/2021   Procedure: RIGHT HEART CATH AND CORONARY/GRAFT ANGIOGRAPHY;  Surgeon: Yvonne Kendall, MD;  Location: [facility] INVASIVE CV LAB;  Service: Cardiovascular;  Laterality:  N/A;     A IV Location/Drains/Wounds Patient Lines/Drains/Airways Status     Active Line/Drains/Airways     Name Placement date Placement time Site Days   Peripheral IV 02/12/23 20 G Anterior;Distal;Left;Upper Arm 02/12/23  2032  Arm  1   NG/OG Vented/Dual Lumen 16 Fr. Right nare 02/12/23  2354  Right nare  1            Intake/Output Last 24 hours  Intake/Output Summary (Last 24 hours) at 02/13/2023 0124 Last data filed at 02/12/2023 2354 Gross per 24 hour  Intake --  Output 150 ml  Net -150 ml    Labs/Imaging Results for orders placed or performed during the hospital encounter of 02/12/23 (from the past 48 hour(s))  Lipase, blood     Status: None   Collection Time: 02/12/23  8:34 PM  Result Value Ref Range   Lipase 46 11 - 51 U/L    Comment: Performed at [facility], [address], [place], [place] [address]  Comprehensive metabolic panel     Status: Abnormal   Collection Time: 02/12/23  8:34 PM  Result Value Ref Range   Sodium 141 135 - 145 mmol/L   Potassium 3.4 (L) 3.5 - 5.1 mmol/L   Chloride 109 98 - 111 mmol/L   CO2 19 (L) 22 - 32 mmol/L   Glucose, Bld 230 (H) 70 - 99 mg/dL    Comment: Glucose reference range applies only to samples taken after fasting for at least 8 hours.   BUN 38 (H) 8 - 23 mg/dL   Creatinine, Ser 5.64 (H) 0.61 - 1.24 mg/dL   Calcium 9.0 8.9 - 33.2 mg/dL   Total Protein 6.5 6.5 - 8.1 g/dL   Albumin 3.8 3.5 - 5.0 g/dL   AST 19 15 - 41 U/L   ALT 24 0 - 44 U/L   Alkaline Phosphatase 96 38 - 126 U/L   Total Bilirubin 0.8 0.3 - 1.2 mg/dL   GFR, Estimated 19 (L) >60 mL/min    Comment: (NOTE) Calculated using the CKD-EPI Creatinine Equation (2021)    Anion gap 13 5 - 15    Comment: Performed at [facility], [address] [place], [place] [address]  CBC     Status: Abnormal   Collection Time: 02/12/23  8:34 PM  Result Value Ref Range   WBC 11.4 (H) 4.0 - 10.5 K/uL   RBC 4.69 4.22 - 5.81 MIL/uL   Hemoglobin 13.4 13.0 -  17.0 g/dL   HCT 41.6 60.6 - 30.1 %   MCV 87.2 80.0 - 100.0 fL   MCH 28.6 26.0 - 34.0 pg   MCHC 32.8 30.0 - 36.0 g/dL   RDW 60.1 09.3 - 23.5 %   Platelets 133 (L) 150 - 400 K/uL    Comment: REPEATED TO VERIFY   nRBC 0.0 0.0 - 0.2 %    Comment: Performed at [facility], [address], [place], [place] [address]  APTT     Status: None  Collection Time: 02/12/23  8:36 PM  Result Value Ref Range   aPTT 30 24 - 36 seconds    Comment: Performed at [facility], [address], [place], [place] [address]  Urinalysis, Routine w reflex microscopic -Urine, Clean Catch     Status: Abnormal   Collection Time: 02/13/23 12:14 AM  Result Value Ref Range   Color, Urine YELLOW YELLOW   APPearance HAZY (A) CLEAR   Specific Gravity, Urine 1.017 1.005 - 1.030   pH 5.0 5.0 - 8.0   Glucose, UA >=500 (A) NEGATIVE mg/dL   Hgb urine dipstick MODERATE (A) NEGATIVE   Bilirubin Urine NEGATIVE NEGATIVE   Ketones, ur NEGATIVE NEGATIVE mg/dL   Protein, ur 914 (A) NEGATIVE mg/dL   Nitrite NEGATIVE NEGATIVE   Leukocytes,Ua TRACE (A) NEGATIVE   RBC / HPF 11-20 0 - 5 RBC/hpf   WBC, UA 6-10 0 - 5 WBC/hpf   Bacteria, UA RARE (A) NONE SEEN   Squamous Epithelial / HPF 0-5 0 - 5 /HPF    Comment: Performed at [facility], [address], [place], [place] [address]   DG Abdomen 1 View  Result Date: 02/13/2023 CLINICAL DATA:  NG tube placement EXAM: ABDOMEN - 1 VIEW COMPARISON:  None Available. FINDINGS: NG tube in place with the tip in the fundus of the stomach. Prior CABG and valve replacement. No confluent airspace opacity. IMPRESSION: NG tube in the stomach. Electronically Signed   By: Charlett Nose M.D.   On: 02/13/2023 00:32   CT ABDOMEN PELVIS W CONTRAST  Result Date: 02/12/2023 CLINICAL DATA:  Abdominal pain. EXAM: CT ABDOMEN AND PELVIS WITH CONTRAST TECHNIQUE: Multidetector CT imaging of the abdomen and pelvis was performed using the standard protocol following bolus administration of  intravenous contrast. RADIATION DOSE REDUCTION: This exam was performed according to the departmental dose-optimization program which includes automated exposure control, adjustment of the mA and/or kV according to patient size and/or use of iterative reconstruction technique. CONTRAST:  75mL OMNIPAQUE IOHEXOL 350 MG/ML SOLN COMPARISON:  None Available. FINDINGS: Lower chest: Moderate elevation noted right hemidiaphragm. Lung bases are clear of infiltrates. There are sternotomy sutures and CABG changes with heavy native CAD. No pericardial effusion. There is a metallic aortic valve prosthesis. The aortic root is dilated at the sinuses of Valsalva where it measures 4.6 cm on 7:80. Surveillance imaging will be needed. The descending aorta is heavily calcified. There is a small hiatal hernia. Hepatobiliary: The liver is 18.3 cm in length and mildly steatotic. No mass enhancement is seen. Gallbladder surgically absent with dilatation of the common bile duct measuring 12 mm. There is no intrahepatic biliary dilatation or calcified ductal filling defect. Pancreas: No abnormality. Spleen: Mildly enlarged, 14.8 cm AP.  No mass. Adrenals/Urinary Tract: There is no adrenal mass. There is an irregular 3 mm nonobstructive caliceal stone in the lower pole of the left kidney, occasional few punctate nonobstructive caliceal stones on the right. There is no obstructing urinary stone or hydronephrosis. There is a 2 cm Bosniak 1 homogeneous cyst in the posterior left kidney, Hounsfield density is 0.7. There is a 1.2 cm Bosniak 1 cyst in the superior pole right kidney, Hounsfield density is -8.5. No follow-up imaging is recommended. There are additional scattered subcentimeter too small to characterize hypodensities in both kidneys consistent with Bosniak 2 cyst. No follow-up imaging is recommended. The bladder thickness is normal. Enlarged prostate impresses into the posterior bladder. Stomach/Bowel: Unremarkable stomach apart from a  small hiatal  hernia. Normal caliber duodenum and jejunum. There are dilated ileal small bowel segments in the right upper to mid abdomen. The mid/lower abdominal small bowel is collapsed. Maximum small bowel caliber is 3.6 cm. The exact transition point was not found. Etiology could be enteritis, occult adhesions or occult internal hernia. The appendix is normal and well seen. There is mild fecal stasis. Scattered diverticula without evidence of diverticulitis. Vascular/Lymphatic: Heavy aortoiliac and visceral branch vessel atherosclerosis. No AAA. No lymphadenopathy is seen. Reproductive: Enlarged prostate, measures 5.2 cm transverse. Bilateral pelvic phleboliths. Other: Small umbilical and bilateral inguinal fat hernias. No incarcerated hernia. No free hemorrhage, free fluid or free air. No bowel pneumatosis is seen. Musculoskeletal: Degenerative change lumbar spine. Degenerative disc disease thoracic spine. Mild osteopenia. No acute or other significant osseous findings. IMPRESSION: 1. Dilated ileal small bowel segments in the right upper to mid abdomen with collapsed mid/lower small bowel consistent with intermediate to high-grade SBO. The exact transition point was not found. Etiology could be enteritis, occult adhesions or occult internal hernia. 2. Constipation and diverticulosis. 3. Prostatomegaly. 4. Aortic atherosclerosis with dilated aortic root at the sinuses of Valsalva where it measures 4.6 cm. Surveillance imaging will be needed. Recommend semi-annual imaging followup by CTA or MRA and referral to cardiothoracic surgery if not already obtained. This recommendation follows 2010 ACCF/AHA/AATS/ACR/ ASA/SCA/SCAI/SIR/STS/SVM Guidelines for the Diagnosis and Management of Patients With Thoracic Aortic Disease. Circulation. 2010; 121: U981-X914. Aortic aneurysm NOS (ICD10-I71.9). 5. Mild hepatic steatosis. 6. Mild splenomegaly. 7. Nonobstructive nephrolithiasis. 8. Umbilical and inguinal fat hernias. Aortic  Atherosclerosis (ICD10-I70.0). Electronically Signed   By: Almira Bar M.D.   On: 02/12/2023 23:15   MR BRAIN WO CONTRAST  Result Date: 02/12/2023 CLINICAL DATA:  Initial evaluation for acute TIA, right lower extremity weakness. EXAM: MRI HEAD WITHOUT CONTRAST TECHNIQUE: Multiplanar, multiecho pulse sequences of the brain and surrounding structures were obtained without intravenous contrast. COMPARISON:  Prior brain MRI from 01/24/2023. FINDINGS: Brain: Advanced cerebral atrophy for age. Extensive chronic microvascular ischemic disease involving the supratentorial cerebral white matter. Multiple scattered remote lacunar infarcts present about the hemispheric cerebral white matter, deep gray nuclei, and pons. Multiple chronic bilateral cerebellar infarcts noted. Small remote bilateral occipital infarcts. There has been interval evolution of previously identified left ACA distribution infarct involving the left perirolandic cortex, less conspicuous as compared to previous (series 2, images 41, 39). Minimal associated petechial blood products without hemorrhagic transformation or mass effect (series 8, image 74). There are a few new subcentimeter foci of diffusion signal abnormality involving the contralateral right frontal lobe (series 2, image 43) as well as the right midbrain/pons (series 2, image 21). Findings are suspicious for additional small acute to early subacute ischemic infarcts. No associated hemorrhage or mass effect. Gray-white matter differentiation otherwise maintained. No acute intracranial hemorrhage. Multiple scattered chronic micro hemorrhages noted, presumably hypertensive in nature. No mass lesion, midline shift or mass effect. No hydrocephalus or extra-axial fluid collection. Pituitary gland suprasellar region within normal limits. Vascular: Loss of normal flow void within the intradural right V4 segment beyond the takeoff of the right PICA, which could reflect slow flow and/or occlusion,  stable. Major intracranial vascular flow voids are otherwise maintained. Dolichoectatic appearance of the intracranial circulation noted. Skull and upper cervical spine: Craniocervical junction within normal limits. Bone marrow signal intensity normal. No scalp soft tissue abnormality. Sinuses/Orbits: Prior bilateral ocular lens replacement. Paranasal sinuses are clear. No significant mastoid effusion. Other: None. IMPRESSION: 1. Normal expected interval evolution of recently identified left  posterior cerebral infarct. Minimal associated petechial blood products without hemorrhagic transformation or mass effect. 2. Few new subcentimeter acute to early subacute ischemic infarcts involving the contralateral right frontal lobe and right midbrain/pons as above. No associated hemorrhage or mass effect. 3. Underlying advanced atrophy with chronic small vessel ischemic disease with multiple chronic infarcts as above. Electronically Signed   By: Rise Mu M.D.   On: 02/12/2023 23:06   CT HEAD WO CONTRAST ( )  Result Date: 02/12/2023 CLINICAL DATA:  New onset headache EXAM: CT HEAD WITHOUT CONTRAST TECHNIQUE: Contiguous axial images were obtained from the base of the skull through the vertex without intravenous contrast. RADIATION DOSE REDUCTION: This exam was performed according to the departmental dose-optimization program which includes automated exposure control, adjustment of the mA and/or kV according to patient size and/or use of iterative reconstruction technique. COMPARISON:  MRI head 01/24/2023 FINDINGS: Brain: No intracranial hemorrhage, mass effect, or evidence of acute infarct. No hydrocephalus. No extra-axial fluid collection. Advanced cerebral atrophy and chronic small vessel ischemic disease. Cerebral atrophy. Chronic infarcts including in the bilateral occipital lobes, left temporal lobe and bilateral cerebellar hemispheres. Vascular: No hyperdense vessel. Intracranial arterial calcification.  Skull: No fracture or focal lesion. Sinuses/Orbits: No acute finding. Other: None. IMPRESSION: 1. No evidence of acute intracranial abnormality. 2. Cerebral atrophy and chronic small vessel ischemic disease and multiple chronic infarcts. Electronically Signed   By: Minerva Fester M.D.   On: 02/12/2023 22:54   DG Chest Port 1 View  Result Date: 02/12/2023 CLINICAL DATA:  Nausea and vomiting EXAM: PORTABLE CHEST 1 VIEW COMPARISON:  12/06/2022 FINDINGS: Sternotomy and CABG. AVR. Stable cardiomediastinal silhouette. Aortic atherosclerotic calcification. No focal consolidation, pleural effusion, or pneumothorax. No displaced rib fractures. IMPRESSION: No acute cardiopulmonary disease. Electronically Signed   By: Minerva Fester M.D.   On: 02/12/2023 21:18    Pending Labs Unresulted Labs (From admission, onward)     Start     Ordered   02/13/23 0500  Lipid panel  (Labs)  Tomorrow morning,   R       Comments: Fasting    02/13/23 0107            Vitals/Pain Today's Vitals   02/12/23 2341 02/12/23 2359 02/13/23 0000 02/13/23 0100  BP: (!) 161/84  (!) 177/98 137/89  Pulse: 65  76 69  Resp:      Temp:      SpO2: 100%  97% 93%  Weight:      Height:      PainSc:  0-No pain      Isolation Precautions No active isolations  Medications Medications  iohexol (OMNIPAQUE) 350 MG/ML injection 50 mL (has no administration in time range)  lactated ringers infusion (has no administration in time range)   stroke: early stages of recovery book (has no administration in time range)  sodium chloride 0.9 % bolus 500 mL (has no administration in time range)  ondansetron (ZOFRAN) injection 4 mg (4 mg Intravenous Given 02/12/23 2115)  sodium chloride 0.9 % bolus 1,000 mL (0 mLs Intravenous Stopped 02/12/23 2334)  meclizine (ANTIVERT) tablet 25 mg (25 mg Oral Given 02/12/23 2115)  iohexol (OMNIPAQUE) 350 MG/ML injection 75 mL (75 mLs Intravenous Contrast Given 02/12/23 2231)    Mobility walks with device      Focused Assessments Neuro Assessment Handoff:  Swallow screen pass?  N/A         Neuro Assessment:   Neuro Checks:      Has TPA been given?  No If patient is a Neuro Trauma and patient is going to OR before floor call report to 4N Charge nurse: [phone] or [phone]   R Recommendations: See Admitting Provider Note  Report given to:   Additional Notes:

## 2023-02-13 NOTE — Assessment & Plan Note (Addendum)
Initial presenting complaint.  Admitted and had NG tube placed.  General surgery consulted.  Underwent small bowel protocol, and contrast was observed to pass to the colon.  NG removed, and he is advanced to solid diet without difficulty, general surgery have signed off and his symptoms appear resolved.

## 2023-02-13 NOTE — H&P (Addendum)
History and Physical    Patient: Shane Holloway:[id number] DOB: 1951/01/04 DOA: 02/12/2023 DOS: the patient was seen and examined on 02/13/2023 PCP: Kurtis Bushman, MD  Patient coming from: Home  Chief Complaint:  Chief Complaint  Patient presents with   Nausea    Nausea and vomiting got 4 zofran    HPI: Shane Holloway is a 72 y.o. male with medical history significant for stroke 2 weeks ago, history of DVT on Eliquis, history of aortic valve repair, CKD, hypertension, and hyperlipidemia who was in his usual state of health today until about 6:00 PM.  The patient was driving to church and he started vomiting uncontrollably.  He vomited all over himself so he had to turn around and go home.  Continued to vomit at home so he eventually had to call 911.  The workup in the emergency department included a CT scan of the patient's abdomen and pelvis which was significant for a small bowel obstruction.  The patient reports that the only deficit his previous stroke left him was some inability to move his right foot.  He denies any fevers or chills or chest pain, abdominal pain, or shortness of breath.  The vomiting started suddenly when he was in the car but earlier that day he been able to eat his usual meals without any difficulty. The patient also had an MRI of the brain given his recent strokes and several subcentimeter acute frontal CVAs were found. The patient had an NG tube inserted and he will be admitted to medicine.   Review of Systems: As mentioned in the history of present illness. All other systems reviewed and are negative. Past Medical History:  Diagnosis Date   Acute on chronic combined systolic and diastolic CHF (congestive heart failure) (HCC) 11/24/2008   Qualifier: Diagnosis of   By: Johnnette Barrios, CNA, Sandy         Acute renal failure with acute tubular necrosis superimposed on stage 3b chronic kidney disease (HCC) 12/15/2021   Allergy    Aortic stenosis    Arthritis     hands   BPH (benign prostatic hyperplasia)    CHF (congestive heart failure) (HCC)    CKD (chronic kidney disease)    COVID-19    COVID-19 virus infection 12/16/2021   Diabetes mellitus    Dyspnea    DYSPNEA 11/24/2008   Qualifier: Diagnosis of   By: Johnnette Barrios, CNA, Sandy         Hyperlipidemia    Hypertension    Hypertensive urgency 12/10/2021   Myocardial infarction [facility]) 06/10/2002   NSTEMI (non-ST elevated myocardial infarction) (HCC) 12/10/2021   Stroke (HCC) 09/2020   Some memory deficits   Vertigo    Wears dentures    full upper   Past Surgical History:  Procedure Laterality Date   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  Redo of 2V CABG, Aortic Root repair and AVR   CATARACT EXTRACTION W/PHACO Right 11/19/2021   Procedure: CATARACT EXTRACTION PHACO AND INTRAOCULAR LENS PLACEMENT (IOC) RIGHT DIABETIC;  Surgeon: Nevada Crane, MD;  Location: [facility];  Service: Ophthalmology;  Laterality: Right;  7.32 0:54.3   CATARACT EXTRACTION W/PHACO Left 12/03/2021   Procedure: CATARACT EXTRACTION PHACO AND INTRAOCULAR LENS PLACEMENT (IOC) LEFT DIABETIC;  Surgeon: Estanislado Pandy, MD;  Location: [facility];  Service: Ophthalmology;  Laterality: Left;  LEAVE FIRST CASE Diabetic 3.74 00:36.3   CORONARY ARTERY BYPASS GRAFT  2009  x4; stents to diagonal a mid right coronary lesion; drug eluting stent in the mid RCA as well as ostium of the diagonal with a cutting balloon angioplasty of the diagonal.   CORONARY STENT INTERVENTION N/A 12/14/2021   Procedure: CORONARY STENT INTERVENTION;  Surgeon: Iran Ouch, MD;  Location: [facility];  Service: Cardiovascular;  Laterality: N/A;   CORONARY ULTRASOUND/IVUS N/A 12/14/2021   Procedure: Intravascular Ultrasound/IVUS;  Surgeon: Iran Ouch, MD;  Location: [facility];  Service: Cardiovascular;  Laterality: N/A;   JOINT REPLACEMENT     RIGHT  HEART CATH AND CORONARY/GRAFT ANGIOGRAPHY N/A 12/12/2021   Procedure: RIGHT HEART CATH AND CORONARY/GRAFT ANGIOGRAPHY;  Surgeon: Yvonne Kendall, MD;  Location: [facility];  Service: Cardiovascular;  Laterality: N/A;   Social History:  reports that he quit smoking about 29 years ago. His smoking use included cigarettes. He started smoking about 33 years ago. He has a 1 pack-year smoking history. He quit smokeless tobacco use about 29 years ago.  His smokeless tobacco use included chew. He reports that he does not drink alcohol and does not use drugs.  Allergies  Allergen Reactions   Altace [Ramipril] Cough   Shellfish Allergy Nausea And Vomiting   Sulfa Antibiotics Hives    Family History  Problem Relation Age of Onset   Heart disease Mother    Heart disease Father     Prior to Admission medications   Medication Sig Start Date End Date Taking? Authorizing Provider  amiodarone (PACERONE) 200 MG tablet Take 1 tablet by mouth daily. 04/17/21 03/22/23 Yes [provider]  apixaban (ELIQUIS) 5 MG TABS tablet Take 2 tablets (10 mg total) by mouth 2 (two) times daily for 6 days, THEN 1 tablet (5 mg total) 2 (two) times daily. 01/27/23 05/03/23 Yes [place], Alvira Philips, DO  aspirin EC 81 MG tablet Take 81 mg by mouth daily. 05/23/22 05/23/23 Yes [provider]  atorvastatin (LIPITOR) 80 MG tablet Take 80 mg by mouth daily. 12/12/20  Yes [provider]  empagliflozin (JARDIANCE) 10 MG TABS tablet Take 1 tablet by mouth daily. 08/08/21  Yes [provider]  ENTRESTO 49-51 MG Take 1 tablet by mouth 2 (two) times daily.   Yes [provider]  ezetimibe (ZETIA) 10 MG tablet Take 10 mg by mouth daily.   Yes [provider]  finasteride (PROSCAR) 5 MG tablet Take 5 mg by mouth daily. 09/05/22  Yes [provider]  insulin detemir (LEVEMIR) 100 UNIT/ML injection Inject 0.12 mLs (12 Units total) into the skin at bedtime. 01/27/23  Yes [place],  Eric J, DO  Insulin NPH, Human,, Isophane, (HUMULIN N) 100 UNIT/ML Kiwkpen Inject 10 Units into the skin 2 (two) times daily. 02/04/23 02/04/24 Yes [provider]  metoprolol succinate (TOPROL-XL) 50 MG 24 hr tablet Take 50 mg by mouth daily.   Yes [provider]  nitroGLYCERIN (NITROSTAT) 0.4 MG SL tablet Place under the tongue. 01/23/22  Yes [provider]  Semaglutide, 1 MG/DOSE, (OZEMPIC, 1 MG/DOSE,) 4 MG/3ML SOPN Inject 1 mg into the skin once a week.   Yes [provider]  tamsulosin (FLOMAX) 0.4 MG CAPS capsule Take 0.4 mg by mouth daily.   Yes [provider]    Physical Exam: Vitals:   02/12/23 2100 02/12/23 2121 02/12/23 2341 02/13/23 0000  BP: (!) 147/79 (!) 155/89 (!) 161/84 (!) 177/98  Pulse: 61 63 65 76  Resp: 17 20    Temp:  SpO2: 94% 98% 100% 97%  Weight:      Height:       Physical Exam:  General: No acute distress, well developed, well nourished, eyes closed HEENT: Normocephalic, atraumatic, PER Cardiovascular: Normal rate and rhythm. Distal pulses intact. Pulmonary: Normal pulmonary effort, normal breath sounds Gastrointestinal: Nondistended abdomen, soft, non-tender, normoactive bowel sounds, no organomegaly, NGT in place Musculoskeletal:Normal ROM, no lower ext edema Lymphadenopathy: No cervical LAD. Skin: Skin is warm and dry. Neuro: AAOx3, mild weakness in left low ext. PSYCH: Attentive and cooperative  Data Reviewed:  Results for orders placed or performed during the hospital encounter of 02/12/23 (from the past 24 hour(s))  Lipase, blood     Status: None   Collection Time: 02/12/23  8:34 PM  Result Value Ref Range   Lipase 46 11 - 51 U/L  Comprehensive metabolic panel     Status: Abnormal   Collection Time: 02/12/23  8:34 PM  Result Value Ref Range   Sodium 141 135 - 145 mmol/L   Potassium 3.4 (L) 3.5 - 5.1 mmol/L   Chloride 109 98 - 111 mmol/L   CO2 19 (L) 22 - 32 mmol/L   Glucose, Bld 230 (H) 70  - 99 mg/dL   BUN 38 (H) 8 - 23 mg/dL   Creatinine, Ser 9.56 (H) 0.61 - 1.24 mg/dL   Calcium 9.0 8.9 - 38.7 mg/dL   Total Protein 6.5 6.5 - 8.1 g/dL   Albumin 3.8 3.5 - 5.0 g/dL   AST 19 15 - 41 U/L   ALT 24 0 - 44 U/L   Alkaline Phosphatase 96 38 - 126 U/L   Total Bilirubin 0.8 0.3 - 1.2 mg/dL   GFR, Estimated 19 (L) >60 mL/min   Anion gap 13 5 - 15  CBC     Status: Abnormal   Collection Time: 02/12/23  8:34 PM  Result Value Ref Range   WBC 11.4 (H) 4.0 - 10.5 K/uL   RBC 4.69 4.22 - 5.81 MIL/uL   Hemoglobin 13.4 13.0 - 17.0 g/dL   HCT 56.4 33.2 - 95.1 %   MCV 87.2 80.0 - 100.0 fL   MCH 28.6 26.0 - 34.0 pg   MCHC 32.8 30.0 - 36.0 g/dL   RDW 88.4 16.6 - 06.3 %   Platelets 133 (L) 150 - 400 K/uL   nRBC 0.0 0.0 - 0.2 %  APTT     Status: None   Collection Time: 02/12/23  8:36 PM  Result Value Ref Range   aPTT 30 24 - 36 seconds  Urinalysis, Routine w reflex microscopic -Urine, Clean Catch     Status: Abnormal   Collection Time: 02/13/23 12:14 AM  Result Value Ref Range   Color, Urine YELLOW YELLOW   APPearance HAZY (A) CLEAR   Specific Gravity, Urine 1.017 1.005 - 1.030   pH 5.0 5.0 - 8.0   Glucose, UA >=500 (A) NEGATIVE mg/dL   Hgb urine dipstick MODERATE (A) NEGATIVE   Bilirubin Urine NEGATIVE NEGATIVE   Ketones, ur NEGATIVE NEGATIVE mg/dL   Protein, ur 016 (A) NEGATIVE mg/dL   Nitrite NEGATIVE NEGATIVE   Leukocytes,Ua TRACE (A) NEGATIVE   RBC / HPF 11-20 0 - 5 RBC/hpf   WBC, UA 6-10 0 - 5 WBC/hpf   Bacteria, UA RARE (A) NONE SEEN   Squamous Epithelial / HPF 0-5 0 - 5 /HPF    CT Abd/ pelvis IMPRESSION: 1. Dilated ileal small bowel segments in the right upper to mid abdomen with collapsed  mid/lower small bowel consistent with intermediate to high-grade SBO. The exact transition point was not found. Etiology could be enteritis, occult adhesions or occult internal hernia. 2. Constipation and diverticulosis. 3. Prostatomegaly. 4. Aortic atherosclerosis with dilated  aortic root at the sinuses of Valsalva where it measures 4.6 cm. Surveillance imaging will be needed. Recommend semi-annual imaging followup by CTA or MRA and referral to cardiothoracic surgery if not already obtained. This recommendation follows 2010 ACCF/AHA/AATS/ACR/ ASA/SCA/SCAI/SIR/STS/SVM Guidelines for the Diagnosis and Management of Patients With Thoracic Aortic Disease. Circulation. 2010; 121: Z610-R604. Aortic aneurysm NOS (ICD10-I71.9). 5. Mild hepatic steatosis. 6. Mild splenomegaly. 7. Nonobstructive nephrolithiasis. 8. Umbilical and inguinal fat hernias.  Brain MRI IMPRESSION: 1. Normal expected interval evolution of recently identified left posterior cerebral infarct. Minimal associated petechial blood products without hemorrhagic transformation or mass effect. 2. Few new subcentimeter acute to early subacute ischemic infarcts involving the contralateral right frontal lobe and right midbrain/pons as above. No associated hemorrhage or mass effect. 3. Underlying advanced atrophy with chronic small vessel ischemic disease with multiple chronic infarcts as above.  Assessment and Plan: SBO - NGT to LIS in place.  IVF.  Surgeon consulted. New Subcentimeter CVA noted on MRI - He doesnt have symptoms from this.  Neurology consulted.  Recent CVA 2 weeks ago.  Await recommendations.  PT/OT/Speech.  Echo as that wasn't completed when he was hospitalized 2 weeks ago. H/o DVT - Hold Eliquis and all PO meds for now. Resume routine meds 5. DVT prophylaxis - SCDs for now.    Advance Care Planning:   Code Status: Prior the patient names [name] as his surrogate decision-maker.  He will be full code by default at this time.  CODE STATUS discussion was deferred.  Consults: Surgeon and neurology  Family Communication: none  Severity of Illness: The appropriate patient status for this patient is INPATIENT. Inpatient status is judged to be reasonable and necessary in order to provide  the required intensity of service to ensure the patient's safety. The patient's presenting symptoms, physical exam findings, and initial radiographic and laboratory data in the context of their chronic comorbidities is felt to place them at high risk for further clinical deterioration. Furthermore, it is not anticipated that the patient will be medically stable for discharge from the hospital within 2 midnights of admission.   * I certify that at the point of admission it is my clinical judgment that the patient will require inpatient hospital care spanning beyond 2 midnights from the point of admission due to high intensity of service, high risk for further deterioration and high frequency of surveillance required.*  Author: Buena Irish, MD 02/13/2023 12:42 AM  For on call review [email or web address].

## 2023-02-13 NOTE — Consult Note (Signed)
Neurology Consultation Reason for Consult: New strokes Referring Physician: Silverio Lay, D  CC: New strokes  History is obtained from: Patient, wife  HPI: Shane Holloway is a 72 y.o. male who was in his normal state of health this evening until around 7:30 PM, at that time he began having nausea and vomiting as well as lightheadedness and dizziness.  His wife also notes that he may have started slurring his speech a little bit more than typical at that time as well.  He just had recent strokes, and has been convalescing well from this, with improved strength in his right lower extremity.  Since this started, he has been lightheaded and nauseous, and was found to have a small bowel obstruction.  Due to the recent stroke and dizziness, an MRI was obtained which demonstrates strokes that were not present on the initial scan.  He is mildly confused, but has a history of vascular dementia at baseline.  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 (HCC) 12/15/2021   Allergy    Aortic stenosis    Arthritis    hands   BPH (benign prostatic hyperplasia)    CHF (congestive heart failure) (HCC)    CKD (chronic kidney disease)    COVID-19    COVID-19 virus infection 12/16/2021   Diabetes mellitus    Dyspnea    DYSPNEA 11/24/2008   Qualifier: Diagnosis of   By: Johnnette Barrios, CNA, Sandy         Hyperlipidemia    Hypertension    Hypertensive urgency 12/10/2021   Myocardial infarction [facility]) 06/10/2002   NSTEMI (non-ST elevated myocardial infarction) (HCC) 12/10/2021   Stroke (HCC) 09/2020   Some memory deficits   Vertigo    Wears dentures    full upper     Family History  Problem Relation Age of Onset    Heart disease Mother    Heart disease Father      Social History:  reports that he quit smoking about 29 years ago. His smoking use included cigarettes. He started smoking about 33 years ago. He has a 1 pack-year smoking history. He quit smokeless tobacco use about 29 years ago.  His smokeless tobacco use included chew. He reports that he does not drink alcohol and does not use drugs.   Exam: Current vital signs: BP (!) 170/104 (BP Location: Right Arm)   Pulse 71   Temp 98.5 F (36.9 C) (Oral)   Resp 18   Ht 6\' 2"  (1.88 m)   Wt 100 kg   SpO2 97%   BMI 28.31 kg/m  Vital signs in last 24 hours: Temp:  [97.8 F (36.6 C)-98.5 F (36.9 C)] 98.5 F (36.9 C) (09/05 0250) Pulse Rate:  [61-76] 71 (09/05 0250) Resp:  [16-20] 18 (09/05 0250) BP: (137-177)/(79-104) 170/104 (09/05 0250) SpO2:  [93 %-100 %] 97 % (09/05 0250) Weight:  [100 kg] 100 kg (09/04 2030)   Physical Exam  Appears well-developed and well-nourished.   Neuro: Mental Status: Patient is awake, alert, oriented to person, place, month, year, and situation. Patient is able to give a clear and coherent history. No signs of aphasia or neglect Cranial Nerves: II: Visual Fields  are full. Pupils are equal, round, and reactive to light III,IV, VI: EOMI without ptosis or diploplia.  V: Facial sensation is symmetric to temperature VII: Facial movement is symmetric.  VIII: hearing is intact to voice X: Uvula elevates symmetrically XI: Shoulder shrug is symmetric. XII: tongue is midline without atrophy or fasciculations.  Motor: He has 4/5 proximal strength in the right lower extremity, as well as plantar/dorsiflexion.  He has no drift in either upper extremity, able to lift the left leg against gravity without drift Sensory: Mildly diminished in the right lower extremity Cerebellar: No clear ataxia on finger-nose-finger     I have reviewed labs in epic and the results pertinent to this consultation are: Creatinine  3.24  I have reviewed the images obtained: MRI brain-multiple strokes in multiple vascular territories including a pontine stroke.  Impression: 72 year old male with a history of DVT on anticoagulation who presents with multiple strokes in multiple different vascular territories.  This is highly concerning for central embolic source.  He had both a transcranial Doppler as well as an echo with bubble which did not demonstrate any right-to-left shunt either cardiac or otherwise.  He did not have vascular imaging of the aorta or proximal great vessels because of his elevated creatinine, and I will get an MRA without contrast of the head and neck.  It is possible that these strokes happened around the time that he was off Eliquis previously, meaning that it is unclear that this was an Eliquis failure.  My suspicion is that the symptoms that he presented with were more due to his small bowel obstruction then his new stroke.  Recommendations: 1) hold anticoagulation for the time being given that he is n.p.o. 2) if he is not going to surgery, could consider starting rectal aspirin 3) MRA head and neck 4) PT, OT, ST 5) stroke team to follow   Ritta Slot, MD [facility] [phone]  If 7pm- 7am, please page neurology on call as listed in AMION.

## 2023-02-13 NOTE — Consult Note (Signed)
[facility] Surgery Consult Note  MANDRELL PRECHTEL Dec 29, 1950  [id number].    Requesting MD: Joen Laura Chief Complaint/Reason for Consult: SBO   HPI:  Shane Holloway is a 72 y.o. male with h/o of DVT on Eliquis, history of aortic valve repair, CKD, HTN, HLD, and recent stroke 2 weeks ago who presented to the ED with chief complaint abdominal pain. States that his symptoms started yesterday. Associated with multiple episodes of nausea and vomiting. Cannot remember when his last BM was, but typically has 1-2 bowel movements per day.  Worked up by EDP with CT scan that showed dilated ileal small bowel segments in the right upper to mid abdomen with collapsed mid/lower small bowel consistent with intermediate to high-grade SBO; exact transition point was not found; etiology could be enteritis, occult adhesions or occult internal hernia. NG tube was placed and patient was admitted to the medical service. Neurology following because imaging this admission shows multiple strokes in multiple different vascular territories. General surgery asked to see regarding SBO. Denies prior h/o SBO. Since admission he states that he is feeling better. Abdominal pain improved. Not passing flatus or had a BM yet.  Abdominal surgical history: umbilical hernia repair   Family History  Problem Relation Age of Onset   Heart disease Mother    Heart disease Father     Past Medical History:  Diagnosis Date   Acute on chronic combined systolic and diastolic CHF (congestive heart failure) (HCC) 11/24/2008   Qualifier: Diagnosis of   By: Johnnette Barrios, CNA, Sandy         Acute renal failure with acute tubular necrosis superimposed on stage 3b chronic kidney disease (HCC) 12/15/2021   Allergy    Aortic stenosis    Arthritis    hands   BPH (benign prostatic hyperplasia)    CHF (congestive heart failure) (HCC)    CKD (chronic kidney disease)    COVID-19    COVID-19 virus infection 12/16/2021   Diabetes  mellitus    Dyspnea    DYSPNEA 11/24/2008   Qualifier: Diagnosis of   By: Johnnette Barrios, CNA, Sandy         Hyperlipidemia    Hypertension    Hypertensive urgency 12/10/2021   Myocardial infarction [facility]) 06/10/2002   NSTEMI (non-ST elevated myocardial infarction) (HCC) 12/10/2021   Stroke (HCC) 09/2020   Some memory deficits   Vertigo    Wears dentures    full upper    Past Surgical History:  Procedure Laterality Date   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  Redo of 2V CABG, Aortic Root repair and AVR   CATARACT EXTRACTION W/PHACO Right 11/19/2021   Procedure: CATARACT EXTRACTION PHACO AND INTRAOCULAR LENS PLACEMENT (IOC) RIGHT DIABETIC;  Surgeon: Nevada Crane, MD;  Location: [facility];  Service: Ophthalmology;  Laterality: Right;  7.32 0:54.3   CATARACT EXTRACTION W/PHACO Left 12/03/2021   Procedure: CATARACT EXTRACTION PHACO AND INTRAOCULAR LENS PLACEMENT (IOC) LEFT DIABETIC;  Surgeon: Estanislado Pandy, MD;  Location: [facility];  Service: Ophthalmology;  Laterality: Left;  LEAVE FIRST CASE Diabetic 3.74 00:36.3   CORONARY ARTERY BYPASS GRAFT  2009   x4; stents to diagonal a mid right coronary lesion; drug eluting stent in the mid RCA as well as ostium of the diagonal with a cutting balloon angioplasty of the diagonal.   CORONARY STENT INTERVENTION N/A 12/14/2021   Procedure: CORONARY STENT INTERVENTION;  Surgeon: Iran Ouch, MD;  Location:  [facility] INVASIVE CV LAB;  Service: Cardiovascular;  Laterality: N/A;   CORONARY ULTRASOUND/IVUS N/A 12/14/2021   Procedure: Intravascular Ultrasound/IVUS;  Surgeon: Iran Ouch, MD;  Location: [facility] INVASIVE CV LAB;  Service: Cardiovascular;  Laterality: N/A;   JOINT REPLACEMENT     RIGHT HEART CATH AND CORONARY/GRAFT ANGIOGRAPHY N/A 12/12/2021   Procedure: RIGHT HEART CATH AND CORONARY/GRAFT ANGIOGRAPHY;  Surgeon: Yvonne Kendall, MD;  Location: [facility] INVASIVE CV LAB;   Service: Cardiovascular;  Laterality: N/A;    Social History:  reports that he quit smoking about 29 years ago. His smoking use included cigarettes. He started smoking about 33 years ago. He has a 1 pack-year smoking history. He quit smokeless tobacco use about 29 years ago.  His smokeless tobacco use included chew. He reports that he does not drink alcohol and does not use drugs.  Allergies:  Allergies  Allergen Reactions   Altace [Ramipril] Cough   Shellfish Allergy Nausea And Vomiting   Sulfa Antibiotics Hives    Medications Prior to Admission  Medication Sig Dispense Refill   amiodarone (PACERONE) 200 MG tablet Take 1 tablet by mouth daily.     apixaban (ELIQUIS) 5 MG TABS tablet Take 2 tablets (10 mg total) by mouth 2 (two) times daily for 6 days, THEN 1 tablet (5 mg total) 2 (two) times daily. 204 tablet 0   aspirin EC 81 MG tablet Take 81 mg by mouth daily.     atorvastatin (LIPITOR) 80 MG tablet Take 80 mg by mouth daily.     empagliflozin (JARDIANCE) 10 MG TABS tablet Take 1 tablet by mouth daily.     ENTRESTO 49-51 MG Take 1 tablet by mouth 2 (two) times daily.     ezetimibe (ZETIA) 10 MG tablet Take 10 mg by mouth daily.     finasteride (PROSCAR) 5 MG tablet Take 5 mg by mouth daily.     insulin detemir (LEVEMIR) 100 UNIT/ML injection Inject 0.12 mLs (12 Units total) into the skin at bedtime.     Insulin NPH, Human,, Isophane, (HUMULIN N) 100 UNIT/ML Kiwkpen Inject 10 Units into the skin 2 (two) times daily.     metoprolol succinate (TOPROL-XL) 50 MG 24 hr tablet Take 50 mg by mouth daily.     nitroGLYCERIN (NITROSTAT) 0.4 MG SL tablet Place under the tongue.     Semaglutide, 1 MG/DOSE, (OZEMPIC, 1 MG/DOSE,) 4 MG/3ML SOPN Inject 1 mg into the skin once a week.     tamsulosin (FLOMAX) 0.4 MG CAPS capsule Take 0.4 mg by mouth daily.      Prior to Admission medications   Medication Sig Start Date End Date Taking? Authorizing Provider  amiodarone (PACERONE) 200 MG tablet Take  1 tablet by mouth daily. 04/17/21 03/22/23 Yes [provider]  apixaban (ELIQUIS) 5 MG TABS tablet Take 2 tablets (10 mg total) by mouth 2 (two) times daily for 6 days, THEN 1 tablet (5 mg total) 2 (two) times daily. 01/27/23 05/03/23 Yes [place], Alvira Philips, DO  aspirin EC 81 MG tablet Take 81 mg by mouth daily. 05/23/22 05/23/23 Yes [provider]  atorvastatin (LIPITOR) 80 MG tablet Take 80 mg by mouth daily. 12/12/20  Yes [provider]  empagliflozin (JARDIANCE) 10 MG TABS tablet Take 1 tablet by mouth daily. 08/08/21  Yes [provider]  ENTRESTO 49-51 MG Take 1 tablet by mouth 2 (two) times daily.   Yes [provider]  ezetimibe (ZETIA) 10 MG tablet Take 10 mg by mouth daily.  Yes [provider]  finasteride (PROSCAR) 5 MG tablet Take 5 mg by mouth daily. 09/05/22  Yes [provider]  insulin detemir (LEVEMIR) 100 UNIT/ML injection Inject 0.12 mLs (12 Units total) into the skin at bedtime. 01/27/23  Yes [place], Eric J, DO  Insulin NPH, Human,, Isophane, (HUMULIN N) 100 UNIT/ML Kiwkpen Inject 10 Units into the skin 2 (two) times daily. 02/04/23 02/04/24 Yes [provider]  metoprolol succinate (TOPROL-XL) 50 MG 24 hr tablet Take 50 mg by mouth daily.   Yes [provider]  nitroGLYCERIN (NITROSTAT) 0.4 MG SL tablet Place under the tongue. 01/23/22  Yes [provider]  Semaglutide, 1 MG/DOSE, (OZEMPIC, 1 MG/DOSE,) 4 MG/3ML SOPN Inject 1 mg into the skin once a week.   Yes [provider]  tamsulosin (FLOMAX) 0.4 MG CAPS capsule Take 0.4 mg by mouth daily.   Yes [provider]    Blood pressure (!) 179/92, pulse 72, temperature (!) 97.5 F (36.4 C), temperature source Oral, resp. rate 18, height 6\' 2"  (1.88 m), weight 100 kg, SpO2 97%. Physical Exam: General: pleasant, WD/WN male who is laying in bed in NAD HEENT: head is normocephalic, atraumatic.  Sclera are noninjected.  Pupils equal  and round.  Ears and nose without any masses or lesions.  Mouth is pink and moist. Dentition fair Heart: regular, rate, and rhythm Lungs: Respiratory effort nonlabored on room air Abd: well healed periumbilical incision. Soft, nondistended, nontender  Results for orders placed or performed during the hospital encounter of 02/12/23 (from the past 48 hour(s))  Lipase, blood     Status: None   Collection Time: 02/12/23  8:34 PM  Result Value Ref Range   Lipase 46 11 - 51 U/L    Comment: Performed at [facility], [address], [place], [place] [address]  Comprehensive metabolic panel     Status: Abnormal   Collection Time: 02/12/23  8:34 PM  Result Value Ref Range   Sodium 141 135 - 145 mmol/L   Potassium 3.4 (L) 3.5 - 5.1 mmol/L   Chloride 109 98 - 111 mmol/L   CO2 19 (L) 22 - 32 mmol/L   Glucose, Bld 230 (H) 70 - 99 mg/dL    Comment: Glucose reference range applies only to samples taken after fasting for at least 8 hours.   BUN 38 (H) 8 - 23 mg/dL   Creatinine, Ser 8.41 (H) 0.61 - 1.24 mg/dL   Calcium 9.0 8.9 - 32.4 mg/dL   Total Protein 6.5 6.5 - 8.1 g/dL   Albumin 3.8 3.5 - 5.0 g/dL   AST 19 15 - 41 U/L   ALT 24 0 - 44 U/L   Alkaline Phosphatase 96 38 - 126 U/L   Total Bilirubin 0.8 0.3 - 1.2 mg/dL   GFR, Estimated 19 (L) >60 mL/min    Comment: (NOTE) Calculated using the CKD-EPI Creatinine Equation (2021)    Anion gap 13 5 - 15    Comment: Performed at [facility], [address] [place], [place] [address]  CBC     Status: Abnormal   Collection Time: 02/12/23  8:34 PM  Result Value Ref Range   WBC 11.4 (H) 4.0 - 10.5 K/uL   RBC 4.69 4.22 - 5.81 MIL/uL   Hemoglobin 13.4 13.0 - 17.0 g/dL   HCT 72.5 36.6 - 44.0 %   MCV 87.2 80.0 - 100.0 fL   MCH 28.6 26.0 - 34.0 pg   MCHC 32.8  30.0 - 36.0 g/dL   RDW 57.8 46.9 - 62.9 %   Platelets 133 (L) 150 - 400 K/uL    Comment: REPEATED TO VERIFY   nRBC 0.0 0.0 - 0.2 %    Comment: Performed at [facility]  Lab, [address] [place], [place] [address]  APTT     Status: None   Collection Time: 02/12/23  8:36 PM  Result Value Ref Range   aPTT 30 24 - 36 seconds    Comment: Performed at [facility], [address] [place], [place] [address]  Urinalysis, Routine w reflex microscopic -Urine, Clean Catch     Status: Abnormal   Collection Time: 02/13/23 12:14 AM  Result Value Ref Range   Color, Urine YELLOW YELLOW   APPearance HAZY (A) CLEAR   Specific Gravity, Urine 1.017 1.005 - 1.030   pH 5.0 5.0 - 8.0   Glucose, UA >=500 (A) NEGATIVE mg/dL   Hgb urine dipstick MODERATE (A) NEGATIVE   Bilirubin Urine NEGATIVE NEGATIVE   Ketones, ur NEGATIVE NEGATIVE mg/dL   Protein, ur 102 (A) NEGATIVE mg/dL   Nitrite NEGATIVE NEGATIVE   Leukocytes,Ua TRACE (A) NEGATIVE   RBC / HPF 11-20 0 - 5 RBC/hpf   WBC, UA 6-10 0 - 5 WBC/hpf   Bacteria, UA RARE (A) NONE SEEN   Squamous Epithelial / HPF 0-5 0 - 5 /HPF    Comment: Performed at [facility], [address] [place], [place] [address]  Glucose, capillary     Status: Abnormal   Collection Time: 02/13/23  2:46 AM  Result Value Ref Range   Glucose-Capillary 156 (H) 70 - 99 mg/dL    Comment: Glucose reference range applies only to samples taken after fasting for at least 8 hours.   Comment 1 Notify RN    Comment 2 Document in Chart   Glucose, capillary     Status: Abnormal   Collection Time: 02/13/23  9:39 AM  Result Value Ref Range   Glucose-Capillary 138 (H) 70 - 99 mg/dL    Comment: Glucose reference range applies only to samples taken after fasting for at least 8 hours.   DG Abdomen 1 View  Result Date: 02/13/2023 CLINICAL DATA:  NG tube placement EXAM: ABDOMEN - 1 VIEW COMPARISON:  None Available. FINDINGS: NG tube in place with the tip in the fundus of the stomach. Prior CABG and valve replacement. No confluent airspace opacity. IMPRESSION: NG tube in the stomach. Electronically Signed   By: Charlett Nose M.D.   On: 02/13/2023 00:32    CT ABDOMEN PELVIS W CONTRAST  Result Date: 02/12/2023 CLINICAL DATA:  Abdominal pain. EXAM: CT ABDOMEN AND PELVIS WITH CONTRAST TECHNIQUE: Multidetector CT imaging of the abdomen and pelvis was performed using the standard protocol following bolus administration of intravenous contrast. RADIATION DOSE REDUCTION: This exam was performed according to the departmental dose-optimization program which includes automated exposure control, adjustment of the mA and/or kV according to patient size and/or use of iterative reconstruction technique. CONTRAST:  75mL OMNIPAQUE IOHEXOL 350 MG/ML SOLN COMPARISON:  None Available. FINDINGS: Lower chest: Moderate elevation noted right hemidiaphragm. Lung bases are clear of infiltrates. There are sternotomy sutures and CABG changes with heavy native CAD. No pericardial effusion. There is a metallic aortic valve prosthesis. The aortic root is dilated at the sinuses of Valsalva where it measures 4.6 cm on 7:80. Surveillance imaging will be needed. The descending aorta is heavily calcified. There is a small hiatal  hernia. Hepatobiliary: The liver is 18.3 cm in length and mildly steatotic. No mass enhancement is seen. Gallbladder surgically absent with dilatation of the common bile duct measuring 12 mm. There is no intrahepatic biliary dilatation or calcified ductal filling defect. Pancreas: No abnormality. Spleen: Mildly enlarged, 14.8 cm AP.  No mass. Adrenals/Urinary Tract: There is no adrenal mass. There is an irregular 3 mm nonobstructive caliceal stone in the lower pole of the left kidney, occasional few punctate nonobstructive caliceal stones on the right. There is no obstructing urinary stone or hydronephrosis. There is a 2 cm Bosniak 1 homogeneous cyst in the posterior left kidney, Hounsfield density is 0.7. There is a 1.2 cm Bosniak 1 cyst in the superior pole right kidney, Hounsfield density is -8.5. No follow-up imaging is recommended. There are additional scattered  subcentimeter too small to characterize hypodensities in both kidneys consistent with Bosniak 2 cyst. No follow-up imaging is recommended. The bladder thickness is normal. Enlarged prostate impresses into the posterior bladder. Stomach/Bowel: Unremarkable stomach apart from a small hiatal hernia. Normal caliber duodenum and jejunum. There are dilated ileal small bowel segments in the right upper to mid abdomen. The mid/lower abdominal small bowel is collapsed. Maximum small bowel caliber is 3.6 cm. The exact transition point was not found. Etiology could be enteritis, occult adhesions or occult internal hernia. The appendix is normal and well seen. There is mild fecal stasis. Scattered diverticula without evidence of diverticulitis. Vascular/Lymphatic: Heavy aortoiliac and visceral branch vessel atherosclerosis. No AAA. No lymphadenopathy is seen. Reproductive: Enlarged prostate, measures 5.2 cm transverse. Bilateral pelvic phleboliths. Other: Small umbilical and bilateral inguinal fat hernias. No incarcerated hernia. No free hemorrhage, free fluid or free air. No bowel pneumatosis is seen. Musculoskeletal: Degenerative change lumbar spine. Degenerative disc disease thoracic spine. Mild osteopenia. No acute or other significant osseous findings. IMPRESSION: 1. Dilated ileal small bowel segments in the right upper to mid abdomen with collapsed mid/lower small bowel consistent with intermediate to high-grade SBO. The exact transition point was not found. Etiology could be enteritis, occult adhesions or occult internal hernia. 2. Constipation and diverticulosis. 3. Prostatomegaly. 4. Aortic atherosclerosis with dilated aortic root at the sinuses of Valsalva where it measures 4.6 cm. Surveillance imaging will be needed. Recommend semi-annual imaging followup by CTA or MRA and referral to cardiothoracic surgery if not already obtained. This recommendation follows 2010 ACCF/AHA/AATS/ACR/ ASA/SCA/SCAI/SIR/STS/SVM  Guidelines for the Diagnosis and Management of Patients With Thoracic Aortic Disease. Circulation. 2010; 121: Z610-R604. Aortic aneurysm NOS (ICD10-I71.9). 5. Mild hepatic steatosis. 6. Mild splenomegaly. 7. Nonobstructive nephrolithiasis. 8. Umbilical and inguinal fat hernias. Aortic Atherosclerosis (ICD10-I70.0). Electronically Signed   By: Almira Bar M.D.   On: 02/12/2023 23:15   MR BRAIN WO CONTRAST  Result Date: 02/12/2023 CLINICAL DATA:  Initial evaluation for acute TIA, right lower extremity weakness. EXAM: MRI HEAD WITHOUT CONTRAST TECHNIQUE: Multiplanar, multiecho pulse sequences of the brain and surrounding structures were obtained without intravenous contrast. COMPARISON:  Prior brain MRI from 01/24/2023. FINDINGS: Brain: Advanced cerebral atrophy for age. Extensive chronic microvascular ischemic disease involving the supratentorial cerebral white matter. Multiple scattered remote lacunar infarcts present about the hemispheric cerebral white matter, deep gray nuclei, and pons. Multiple chronic bilateral cerebellar infarcts noted. Small remote bilateral occipital infarcts. There has been interval evolution of previously identified left ACA distribution infarct involving the left perirolandic cortex, less conspicuous as compared to previous (series 2, images 41, 39). Minimal associated petechial blood products without hemorrhagic transformation or mass effect (series 8, image  74). There are a few new subcentimeter foci of diffusion signal abnormality involving the contralateral right frontal lobe (series 2, image 43) as well as the right midbrain/pons (series 2, image 21). Findings are suspicious for additional small acute to early subacute ischemic infarcts. No associated hemorrhage or mass effect. Gray-white matter differentiation otherwise maintained. No acute intracranial hemorrhage. Multiple scattered chronic micro hemorrhages noted, presumably hypertensive in nature. No mass lesion, midline  shift or mass effect. No hydrocephalus or extra-axial fluid collection. Pituitary gland suprasellar region within normal limits. Vascular: Loss of normal flow void within the intradural right V4 segment beyond the takeoff of the right PICA, which could reflect slow flow and/or occlusion, stable. Major intracranial vascular flow voids are otherwise maintained. Dolichoectatic appearance of the intracranial circulation noted. Skull and upper cervical spine: Craniocervical junction within normal limits. Bone marrow signal intensity normal. No scalp soft tissue abnormality. Sinuses/Orbits: Prior bilateral ocular lens replacement. Paranasal sinuses are clear. No significant mastoid effusion. Other: None. IMPRESSION: 1. Normal expected interval evolution of recently identified left posterior cerebral infarct. Minimal associated petechial blood products without hemorrhagic transformation or mass effect. 2. Few new subcentimeter acute to early subacute ischemic infarcts involving the contralateral right frontal lobe and right midbrain/pons as above. No associated hemorrhage or mass effect. 3. Underlying advanced atrophy with chronic small vessel ischemic disease with multiple chronic infarcts as above. Electronically Signed   By: Rise Mu M.D.   On: 02/12/2023 23:06   CT HEAD WO CONTRAST ( )  Result Date: 02/12/2023 CLINICAL DATA:  New onset headache EXAM: CT HEAD WITHOUT CONTRAST TECHNIQUE: Contiguous axial images were obtained from the base of the skull through the vertex without intravenous contrast. RADIATION DOSE REDUCTION: This exam was performed according to the departmental dose-optimization program which includes automated exposure control, adjustment of the mA and/or kV according to patient size and/or use of iterative reconstruction technique. COMPARISON:  MRI head 01/24/2023 FINDINGS: Brain: No intracranial hemorrhage, mass effect, or evidence of acute infarct. No hydrocephalus. No extra-axial  fluid collection. Advanced cerebral atrophy and chronic small vessel ischemic disease. Cerebral atrophy. Chronic infarcts including in the bilateral occipital lobes, left temporal lobe and bilateral cerebellar hemispheres. Vascular: No hyperdense vessel. Intracranial arterial calcification. Skull: No fracture or focal lesion. Sinuses/Orbits: No acute finding. Other: None. IMPRESSION: 1. No evidence of acute intracranial abnormality. 2. Cerebral atrophy and chronic small vessel ischemic disease and multiple chronic infarcts. Electronically Signed   By: Minerva Fester M.D.   On: 02/12/2023 22:54   DG Chest Port 1 View  Result Date: 02/12/2023 CLINICAL DATA:  Nausea and vomiting EXAM: PORTABLE CHEST 1 VIEW COMPARISON:  12/06/2022 FINDINGS: Sternotomy and CABG. AVR. Stable cardiomediastinal silhouette. Aortic atherosclerotic calcification. No focal consolidation, pleural effusion, or pneumothorax. No displaced rib fractures. IMPRESSION: No acute cardiopulmonary disease. Electronically Signed   By: Minerva Fester M.D.   On: 02/12/2023 21:18      Assessment/Plan SBO - Patient with prior h/o umbilical hernia repair, and CT scan concerning for possible SBO although exact transition point not seen. He also looks constipated on CT. He is clinically feeling better since admission and NG tube placement, although has not had any return in bowel function yet. No indication for acute surgical intervention. Will start patient on SBO protocol. Please continue to hold eliquis. Given his recent stroke he is not a great surgical candidate, hopefully this will resolve with conservative measures.   ID - none VTE - heparin gtt FEN - IVF, NPO/NGT to LIWS Foley -  none  H/o of DVT on Eliquis Hx aortic valve repair CKD HTN HLD recent stroke 01/2023  I reviewed Consultant neurology notes, hospitalist notes, last 24 h vitals and pain scores, last 48 h intake and output, last 24 h labs and trends, and last 24 h imaging  results.   Franne Forts, PA-C [facility] Surgery 02/13/2023, 10:08 AM Please see Amion for pager number during day hours 7:00am-4:30pm

## 2023-02-13 NOTE — Progress Notes (Signed)
  Progress Note   Patient: Shane Holloway ZOX:[id number] DOB: 07-11-1950 DOA: 02/12/2023     0 DOS: the patient was seen and examined on 02/13/2023        Brief hospital course: [name] is a 72 y.o. M with sdCHF EF recovered to 55-60%, CKD IV baseline 2.8, CAD s/p CABG and AV repair, DM, hx SVT, hx DVT on Eliquis, and recent admission for stroke in the setting of holding Eliquis due to dental bleeding who presented with vomiting and abdominal pain.  In the ER, CT showed SBO.  Also found incidentally to have new embolic appearing strokes.     Assessment and Plan: * SBO (small bowel obstruction) (HCC) - NG - IVF - Small bowel protocol    Acute CVA (cerebrovascular accident) (HCC) Unclear if Eliquis failure.  Given multiple strokes in different vascular territories, suspect central embolic source - Check MRA head and neck - Start heparin gtt - Consult Neurology - PT eval  Type 2 diabetes - Start sliding scale corrections         CKD (chronic kidney disease), stage IV (HCC)   Uncontrolled type 2 diabetes mellitus with hyperglycemia, without long-term current use of insulin (HCC)   Essential hypertension   CORONARY ATHEROSCLEROSIS, ARTERY BYPASS GRAFT   Benign prostatic hyperplasia   Vascular dementia without behavioral disturbance (HCC)   Chronic HFrEF (heart failure with reduced ejection fraction) (HCC)   History of DVT (deep vein thrombosis)   Hypokalemia  -Resume home amlodipine, aspirin, Zetia, Proscar, Lipitor, Jardiance, Entresto, metoprolol, Flomax when able to take p.o.         Physical Exam: BP (!) 175/94 (BP Location: Left Leg)   Pulse 70   Temp 98.4 F (36.9 C) (Oral)   Resp 20   Ht 6\' 2"  (1.88 m)   Wt 100 kg   SpO2 97%   BMI 28.31 kg/m   This is a no charge note, for further details, please see H&P by my partner from Dr. Gasper Holloway.  Data Reviewed: Lipase normal Urinalysis unremarkable Patient metabolic panel shows potassium 3.4,  creatinine to 3.2  Family Communication: None present    Disposition: Status is: Inpatient         Author: Alberteen Sam, MD 02/13/2023 2:46 PM  For on call review [email or web address].

## 2023-02-14 ENCOUNTER — Other Ambulatory Visit (HOSPITAL_COMMUNITY): Payer: Medicare HMO

## 2023-02-14 DIAGNOSIS — E876 Hypokalemia: Secondary | ICD-10-CM

## 2023-02-14 DIAGNOSIS — N184 Chronic kidney disease, stage 4 (severe): Secondary | ICD-10-CM | POA: Diagnosis not present

## 2023-02-14 DIAGNOSIS — Z86718 Personal history of other venous thrombosis and embolism: Secondary | ICD-10-CM

## 2023-02-14 DIAGNOSIS — K56609 Unspecified intestinal obstruction, unspecified as to partial versus complete obstruction: Secondary | ICD-10-CM | POA: Diagnosis not present

## 2023-02-14 DIAGNOSIS — I25708 Atherosclerosis of coronary artery bypass graft(s), unspecified, with other forms of angina pectoris: Secondary | ICD-10-CM

## 2023-02-14 DIAGNOSIS — I5022 Chronic systolic (congestive) heart failure: Secondary | ICD-10-CM

## 2023-02-14 DIAGNOSIS — I10 Essential (primary) hypertension: Secondary | ICD-10-CM

## 2023-02-14 DIAGNOSIS — N400 Enlarged prostate without lower urinary tract symptoms: Secondary | ICD-10-CM

## 2023-02-14 DIAGNOSIS — I639 Cerebral infarction, unspecified: Secondary | ICD-10-CM | POA: Diagnosis not present

## 2023-02-14 DIAGNOSIS — E1165 Type 2 diabetes mellitus with hyperglycemia: Secondary | ICD-10-CM

## 2023-02-14 DIAGNOSIS — F0150 Vascular dementia without behavioral disturbance: Secondary | ICD-10-CM

## 2023-02-14 LAB — COMPREHENSIVE METABOLIC PANEL
ALT: 18 U/L (ref 0–44)
AST: 14 U/L — ABNORMAL LOW (ref 15–41)
Albumin: 3.6 g/dL (ref 3.5–5.0)
Alkaline Phosphatase: 101 U/L (ref 38–126)
Anion gap: 13 (ref 5–15)
BUN: 26 mg/dL — ABNORMAL HIGH (ref 8–23)
CO2: 22 mmol/L (ref 22–32)
Calcium: 9.7 mg/dL (ref 8.9–10.3)
Chloride: 110 mmol/L (ref 98–111)
Creatinine, Ser: 2.47 mg/dL — ABNORMAL HIGH (ref 0.61–1.24)
GFR, Estimated: 27 mL/min — ABNORMAL LOW (ref >60)
Glucose, Bld: 123 mg/dL — ABNORMAL HIGH (ref 70–99)
Potassium: 3.7 mmol/L (ref 3.5–5.1)
Sodium: 145 mmol/L (ref 135–145)
Total Bilirubin: 1.6 mg/dL — ABNORMAL HIGH (ref 0.3–1.2)
Total Protein: 6.3 g/dL — ABNORMAL LOW (ref 6.5–8.1)

## 2023-02-14 LAB — GLUCOSE, CAPILLARY
Glucose-Capillary: 117 mg/dL — ABNORMAL HIGH (ref 70–99)
Glucose-Capillary: 133 mg/dL — ABNORMAL HIGH (ref 70–99)
Glucose-Capillary: 134 mg/dL — ABNORMAL HIGH (ref 70–99)
Glucose-Capillary: 170 mg/dL — ABNORMAL HIGH (ref 70–99)
Glucose-Capillary: 173 mg/dL — ABNORMAL HIGH (ref 70–99)
Glucose-Capillary: 236 mg/dL — ABNORMAL HIGH (ref 70–99)

## 2023-02-14 LAB — CBC
HCT: 41.8 % (ref 39.0–52.0)
Hemoglobin: 13.8 g/dL (ref 13.0–17.0)
MCH: 29.2 pg (ref 26.0–34.0)
MCHC: 33 g/dL (ref 30.0–36.0)
MCV: 88.4 fL (ref 80.0–100.0)
Platelets: 146 10*3/uL — ABNORMAL LOW (ref 150–400)
RBC: 4.73 MIL/uL (ref 4.22–5.81)
RDW: 14.7 % (ref 11.5–15.5)
WBC: 7.8 10*3/uL (ref 4.0–10.5)
nRBC: 0 % (ref 0.0–0.2)

## 2023-02-14 LAB — HEPARIN LEVEL (UNFRACTIONATED): Heparin Unfractionated: >1.1 [IU]/mL — ABNORMAL HIGH (ref 0.30–0.70)

## 2023-02-14 LAB — APTT: aPTT: 80 s — ABNORMAL HIGH (ref 24–36)

## 2023-02-14 MED ORDER — FINASTERIDE 5 MG PO TABS
5.0000 mg | ORAL_TABLET | Freq: Every day | ORAL | Status: DC
  Administered 2023-02-14 – 2023-02-18 (×5): 5 mg via ORAL
  Filled 2023-02-14 (×5): qty 1

## 2023-02-14 MED ORDER — ATORVASTATIN CALCIUM 80 MG PO TABS
80.0000 mg | ORAL_TABLET | Freq: Every day | ORAL | Status: DC
  Administered 2023-02-14 – 2023-02-18 (×5): 80 mg via ORAL
  Filled 2023-02-14 (×5): qty 1

## 2023-02-14 MED ORDER — INSULIN ASPART 100 UNIT/ML IJ SOLN
0.0000 [IU] | Freq: Three times a day (TID) | INTRAMUSCULAR | Status: DC
  Administered 2023-02-15: 5 [IU] via SUBCUTANEOUS
  Administered 2023-02-15: 3 [IU] via SUBCUTANEOUS
  Administered 2023-02-15 – 2023-02-16 (×2): 5 [IU] via SUBCUTANEOUS
  Administered 2023-02-16 – 2023-02-17 (×3): 3 [IU] via SUBCUTANEOUS
  Administered 2023-02-17: 2 [IU] via SUBCUTANEOUS
  Administered 2023-02-17: 5 [IU] via SUBCUTANEOUS
  Administered 2023-02-18: 2 [IU] via SUBCUTANEOUS
  Administered 2023-02-18: 3 [IU] via SUBCUTANEOUS

## 2023-02-14 MED ORDER — TAMSULOSIN HCL 0.4 MG PO CAPS
0.4000 mg | ORAL_CAPSULE | Freq: Every day | ORAL | Status: DC
  Administered 2023-02-14 – 2023-02-17 (×4): 0.4 mg via ORAL
  Filled 2023-02-14 (×4): qty 1

## 2023-02-14 MED ORDER — SACUBITRIL-VALSARTAN 49-51 MG PO TABS
1.0000 | ORAL_TABLET | Freq: Two times a day (BID) | ORAL | Status: DC
  Administered 2023-02-14 – 2023-02-15 (×3): 1 via ORAL
  Filled 2023-02-14 (×4): qty 1

## 2023-02-14 MED ORDER — INSULIN ASPART 100 UNIT/ML IJ SOLN: 0.0000 [IU] | Freq: Every day | INTRAMUSCULAR | Status: DC

## 2023-02-14 MED ORDER — APIXABAN 5 MG PO TABS
5.0000 mg | ORAL_TABLET | Freq: Two times a day (BID) | ORAL | Status: DC
  Administered 2023-02-14 – 2023-02-18 (×9): 5 mg via ORAL
  Filled 2023-02-14 (×9): qty 1

## 2023-02-14 MED ORDER — AMIODARONE HCL 200 MG PO TABS
200.0000 mg | ORAL_TABLET | Freq: Every day | ORAL | Status: DC
  Administered 2023-02-14 – 2023-02-18 (×5): 200 mg via ORAL
  Filled 2023-02-14 (×5): qty 1

## 2023-02-14 MED ORDER — INSULIN DETEMIR 100 UNIT/ML ~~LOC~~ SOLN
8.0000 [IU] | Freq: Every day | SUBCUTANEOUS | Status: DC
  Administered 2023-02-14 – 2023-02-17 (×4): 8 [IU] via SUBCUTANEOUS
  Filled 2023-02-14 (×5): qty 0.08

## 2023-02-14 MED ORDER — METOPROLOL SUCCINATE ER 50 MG PO TB24
50.0000 mg | ORAL_TABLET | Freq: Every day | ORAL | Status: DC
  Administered 2023-02-14 – 2023-02-18 (×5): 50 mg via ORAL
  Filled 2023-02-14 (×5): qty 1

## 2023-02-14 MED ORDER — EZETIMIBE 10 MG PO TABS
10.0000 mg | ORAL_TABLET | Freq: Every day | ORAL | Status: DC
  Administered 2023-02-14 – 2023-02-18 (×5): 10 mg via ORAL
  Filled 2023-02-14 (×5): qty 1

## 2023-02-14 NOTE — Progress Notes (Signed)
[facility] Progress Note     Subjective: CC-  No abdominal complaints. Denies pain, nausea, vomiting. Passing some flatus, no BM. Minimal NG output last 24 hours. Xray shows contrast in colon  Objective: Vital signs in last 24 hours: Temp:  [97.5 F (36.4 C)-98.4 F (36.9 C)] 98.1 F (36.7 C) (09/06 0822) Pulse Rate:  [70-79] 79 (09/06 0822) Resp:  [18-20] 19 (09/06 0822) BP: (164-186)/(92-105) 173/99 (09/06 0822) SpO2:  [93 %-97 %] 95 % (09/06 0822) Last BM Date :  (PTA)  Intake/Output from previous day: 09/05 0701 - 09/06 0700 In: 1530.3 [I.V.:1440.3; NG/GT:90] Out: 1600 [Urine:1600] Intake/Output this shift: No intake/output data recorded.  PE: Gen:  Alert, NAD, pleasant Abd: soft, ND, NT  Lab Results:  Recent Labs    02/12/23 2034 02/14/23 0607  WBC 11.4* 7.8  HGB 13.4 13.8  HCT 40.9 41.8  PLT 133* 146*   BMET Recent Labs    02/12/23 2034 02/14/23 0607  NA 141 145  K 3.4* 3.7  CL 109 110  CO2 19* 22  GLUCOSE 230* 123*  BUN 38* 26*  CREATININE 3.24* 2.47*  CALCIUM 9.0 9.7   PT/INR No results for input(s): "LABPROT", "INR" in the last 72 hours. CMP     Component Value Date/Time   NA 145 02/14/2023 0607   K 3.7 02/14/2023 0607   CL 110 02/14/2023 0607   CO2 22 02/14/2023 0607   GLUCOSE 123 (H) 02/14/2023 0607   BUN 26 (H) 02/14/2023 0607   CREATININE 2.47 (H) 02/14/2023 0607   CREATININE 0.79 09/23/2011 1201   CALCIUM 9.7 02/14/2023 0607   PROT 6.3 (L) 02/14/2023 0607   ALBUMIN 3.6 02/14/2023 0607   AST 14 (L) 02/14/2023 0607   ALT 18 02/14/2023 0607   ALKPHOS 101 02/14/2023 0607   BILITOT 1.6 (H) 02/14/2023 0607   GFRNONAA 27 (L) 02/14/2023 0607   GFRAA  03/23/2010 1000    >60        The eGFR has been calculated using the MDRD equation. This calculation has not been validated in all clinical situations. eGFR's persistently <60 mL/min signify possible Chronic Kidney Disease.   Lipase     Component Value Date/Time    LIPASE 46 02/12/2023 2034       Studies/Results: DG Abd Portable 1V-Small Bowel Obstruction Protocol-initial, 8 hr delay  Result Date: 02/13/2023 CLINICAL DATA:  Small bowel obstruction. EXAM: PORTABLE ABDOMEN - 1 VIEW COMPARISON:  CT 02/12/2023 FINDINGS: Tip and side port of the enteric tube below the diaphragm in the stomach. There is enteric contrast throughout the colon. The prominent loops of small bowel persist in the central abdomen. Vascular calcifications. IMPRESSION: 1. Administered enteric contrast is seen throughout the colon. 2. Persistent prominent loops of small bowel in the central abdomen, favor ileus in the setting. Electronically Signed   By: Narda Rutherford M.D.   On: 02/13/2023 22:58   MR ANGIO HEAD WO CONTRAST  Result Date: 02/13/2023 CLINICAL DATA:  Stroke, follow-up. EXAM: MRA NECK WITHOUT CONTRAST MRA HEAD WITHOUT CONTRAST TECHNIQUE: Angiographic images of the Circle of Willis were acquired using MRA technique without intravenous contrast. COMPARISON:  Head MRA 01/24/2023 FINDINGS: MRA NECK FINDINGS Aortic arch: Not included on this noncontrast time-of-flight examination. Right carotid system: Patent without evidence of a significant stenosis or dissection. Left carotid system: Exclusion of the origin of the common carotid artery. The remainder of the common carotid artery and cervical ICA are patent. There is mild irregularity of the distal  common carotid artery without evidence of a significant stenosis or dissection. Vertebral arteries: The left vertebral artery is patent and strongly dominant with suboptimal assessment of the proximal V1 segment due to diminished signal from technical factors but no evidence of a significant stenosis or dissection more distally. The right vertebral artery is hypoplastic with nondiagnostic assessment of the proximal V1 segment but without evidence of a significant stenosis or dissection more distally. Antegrade flow bilaterally. Symmetric  signal loss in both distal V3 segments which is likely artifactual. Other: None. MRA HEAD FINDINGS Anterior circulation: The internal carotid arteries are widely patent from skull base to carotid termini. ACAs and MCAs are patent without evidence of a proximal branch occlusion or significant proximal stenosis. The 2 mm anterior communicating region outpouching described on the prior MRA more clearly reflects a small infundibulum associated with the hypoplastic left A1 segment on today's study. No aneurysm is identified. Posterior circulation: The intracranial left vertebral artery is widely patent and strongly dominant. The distal right vertebral artery is again noted to be hypoplastic, largely terminating in PICA. Patent PICA and SCA origins are visualized bilaterally. The basilar artery is widely patent. Posterior communicating arteries are diminutive or absent. A severe distal right P2 stenosis and asymmetric attenuation of distal right PCA branch vessels are similar to the prior study. No aneurysm is identified. Anatomic variants: Hypoplastic left A1 segment. Hypoplastic right vertebral artery. IMPRESSION: 1. No large vessel occlusion. 2. Severe distal right P2 stenosis with attenuation of more distal right PCA branches. 3. Patent cervical carotid and vertebral arteries without evidence of a significant stenosis or dissection within above described study limitations (mainly affecting assessment of the proximal vertebral arteries). Electronically Signed   By: Sebastian Ache M.D.   On: 02/13/2023 13:00   MR ANGIO NECK WO CONTRAST  Result Date: 02/13/2023 CLINICAL DATA:  Stroke, follow-up. EXAM: MRA NECK WITHOUT CONTRAST MRA HEAD WITHOUT CONTRAST TECHNIQUE: Angiographic images of the Circle of Willis were acquired using MRA technique without intravenous contrast. COMPARISON:  Head MRA 01/24/2023 FINDINGS: MRA NECK FINDINGS Aortic arch: Not included on this noncontrast time-of-flight examination. Right carotid  system: Patent without evidence of a significant stenosis or dissection. Left carotid system: Exclusion of the origin of the common carotid artery. The remainder of the common carotid artery and cervical ICA are patent. There is mild irregularity of the distal common carotid artery without evidence of a significant stenosis or dissection. Vertebral arteries: The left vertebral artery is patent and strongly dominant with suboptimal assessment of the proximal V1 segment due to diminished signal from technical factors but no evidence of a significant stenosis or dissection more distally. The right vertebral artery is hypoplastic with nondiagnostic assessment of the proximal V1 segment but without evidence of a significant stenosis or dissection more distally. Antegrade flow bilaterally. Symmetric signal loss in both distal V3 segments which is likely artifactual. Other: None. MRA HEAD FINDINGS Anterior circulation: The internal carotid arteries are widely patent from skull base to carotid termini. ACAs and MCAs are patent without evidence of a proximal branch occlusion or significant proximal stenosis. The 2 mm anterior communicating region outpouching described on the prior MRA more clearly reflects a small infundibulum associated with the hypoplastic left A1 segment on today's study. No aneurysm is identified. Posterior circulation: The intracranial left vertebral artery is widely patent and strongly dominant. The distal right vertebral artery is again noted to be hypoplastic, largely terminating in PICA. Patent PICA and SCA origins are visualized bilaterally. The basilar  artery is widely patent. Posterior communicating arteries are diminutive or absent. A severe distal right P2 stenosis and asymmetric attenuation of distal right PCA branch vessels are similar to the prior study. No aneurysm is identified. Anatomic variants: Hypoplastic left A1 segment. Hypoplastic right vertebral artery. IMPRESSION: 1. No large  vessel occlusion. 2. Severe distal right P2 stenosis with attenuation of more distal right PCA branches. 3. Patent cervical carotid and vertebral arteries without evidence of a significant stenosis or dissection within above described study limitations (mainly affecting assessment of the proximal vertebral arteries). Electronically Signed   By: Sebastian Ache M.D.   On: 02/13/2023 13:00   DG Abdomen 1 View  Result Date: 02/13/2023 CLINICAL DATA:  NG tube placement EXAM: ABDOMEN - 1 VIEW COMPARISON:  None Available. FINDINGS: NG tube in place with the tip in the fundus of the stomach. Prior CABG and valve replacement. No confluent airspace opacity. IMPRESSION: NG tube in the stomach. Electronically Signed   By: Charlett Nose M.D.   On: 02/13/2023 00:32   CT ABDOMEN PELVIS W CONTRAST  Result Date: 02/12/2023 CLINICAL DATA:  Abdominal pain. EXAM: CT ABDOMEN AND PELVIS WITH CONTRAST TECHNIQUE: Multidetector CT imaging of the abdomen and pelvis was performed using the standard protocol following bolus administration of intravenous contrast. RADIATION DOSE REDUCTION: This exam was performed according to the departmental dose-optimization program which includes automated exposure control, adjustment of the mA and/or kV according to patient size and/or use of iterative reconstruction technique. CONTRAST:  75mL OMNIPAQUE IOHEXOL 350 MG/ML SOLN COMPARISON:  None Available. FINDINGS: Lower chest: Moderate elevation noted right hemidiaphragm. Lung bases are clear of infiltrates. There are sternotomy sutures and CABG changes with heavy native CAD. No pericardial effusion. There is a metallic aortic valve prosthesis. The aortic root is dilated at the sinuses of Valsalva where it measures 4.6 cm on 7:80. Surveillance imaging will be needed. The descending aorta is heavily calcified. There is a small hiatal hernia. Hepatobiliary: The liver is 18.3 cm in length and mildly steatotic. No mass enhancement is seen. Gallbladder  surgically absent with dilatation of the common bile duct measuring 12 mm. There is no intrahepatic biliary dilatation or calcified ductal filling defect. Pancreas: No abnormality. Spleen: Mildly enlarged, 14.8 cm AP.  No mass. Adrenals/Urinary Tract: There is no adrenal mass. There is an irregular 3 mm nonobstructive caliceal stone in the lower pole of the left kidney, occasional few punctate nonobstructive caliceal stones on the right. There is no obstructing urinary stone or hydronephrosis. There is a 2 cm Bosniak 1 homogeneous cyst in the posterior left kidney, Hounsfield density is 0.7. There is a 1.2 cm Bosniak 1 cyst in the superior pole right kidney, Hounsfield density is -8.5. No follow-up imaging is recommended. There are additional scattered subcentimeter too small to characterize hypodensities in both kidneys consistent with Bosniak 2 cyst. No follow-up imaging is recommended. The bladder thickness is normal. Enlarged prostate impresses into the posterior bladder. Stomach/Bowel: Unremarkable stomach apart from a small hiatal hernia. Normal caliber duodenum and jejunum. There are dilated ileal small bowel segments in the right upper to mid abdomen. The mid/lower abdominal small bowel is collapsed. Maximum small bowel caliber is 3.6 cm. The exact transition point was not found. Etiology could be enteritis, occult adhesions or occult internal hernia. The appendix is normal and well seen. There is mild fecal stasis. Scattered diverticula without evidence of diverticulitis. Vascular/Lymphatic: Heavy aortoiliac and visceral branch vessel atherosclerosis. No AAA. No lymphadenopathy is seen. Reproductive: Enlarged prostate,  measures 5.2 cm transverse. Bilateral pelvic phleboliths. Other: Small umbilical and bilateral inguinal fat hernias. No incarcerated hernia. No free hemorrhage, free fluid or free air. No bowel pneumatosis is seen. Musculoskeletal: Degenerative change lumbar spine. Degenerative disc disease  thoracic spine. Mild osteopenia. No acute or other significant osseous findings. IMPRESSION: 1. Dilated ileal small bowel segments in the right upper to mid abdomen with collapsed mid/lower small bowel consistent with intermediate to high-grade SBO. The exact transition point was not found. Etiology could be enteritis, occult adhesions or occult internal hernia. 2. Constipation and diverticulosis. 3. Prostatomegaly. 4. Aortic atherosclerosis with dilated aortic root at the sinuses of Valsalva where it measures 4.6 cm. Surveillance imaging will be needed. Recommend semi-annual imaging followup by CTA or MRA and referral to cardiothoracic surgery if not already obtained. This recommendation follows 2010 ACCF/AHA/AATS/ACR/ ASA/SCA/SCAI/SIR/STS/SVM Guidelines for the Diagnosis and Management of Patients With Thoracic Aortic Disease. Circulation. 2010; 121: Z601-U932. Aortic aneurysm NOS (ICD10-I71.9). 5. Mild hepatic steatosis. 6. Mild splenomegaly. 7. Nonobstructive nephrolithiasis. 8. Umbilical and inguinal fat hernias. Aortic Atherosclerosis (ICD10-I70.0). Electronically Signed   By: Almira Bar M.D.   On: 02/12/2023 23:15   MR BRAIN WO CONTRAST  Result Date: 02/12/2023 CLINICAL DATA:  Initial evaluation for acute TIA, right lower extremity weakness. EXAM: MRI HEAD WITHOUT CONTRAST TECHNIQUE: Multiplanar, multiecho pulse sequences of the brain and surrounding structures were obtained without intravenous contrast. COMPARISON:  Prior brain MRI from 01/24/2023. FINDINGS: Brain: Advanced cerebral atrophy for age. Extensive chronic microvascular ischemic disease involving the supratentorial cerebral white matter. Multiple scattered remote lacunar infarcts present about the hemispheric cerebral white matter, deep gray nuclei, and pons. Multiple chronic bilateral cerebellar infarcts noted. Small remote bilateral occipital infarcts. There has been interval evolution of previously identified left ACA distribution  infarct involving the left perirolandic cortex, less conspicuous as compared to previous (series 2, images 41, 39). Minimal associated petechial blood products without hemorrhagic transformation or mass effect (series 8, image 74). There are a few new subcentimeter foci of diffusion signal abnormality involving the contralateral right frontal lobe (series 2, image 43) as well as the right midbrain/pons (series 2, image 21). Findings are suspicious for additional small acute to early subacute ischemic infarcts. No associated hemorrhage or mass effect. Gray-white matter differentiation otherwise maintained. No acute intracranial hemorrhage. Multiple scattered chronic micro hemorrhages noted, presumably hypertensive in nature. No mass lesion, midline shift or mass effect. No hydrocephalus or extra-axial fluid collection. Pituitary gland suprasellar region within normal limits. Vascular: Loss of normal flow void within the intradural right V4 segment beyond the takeoff of the right PICA, which could reflect slow flow and/or occlusion, stable. Major intracranial vascular flow voids are otherwise maintained. Dolichoectatic appearance of the intracranial circulation noted. Skull and upper cervical spine: Craniocervical junction within normal limits. Bone marrow signal intensity normal. No scalp soft tissue abnormality. Sinuses/Orbits: Prior bilateral ocular lens replacement. Paranasal sinuses are clear. No significant mastoid effusion. Other: None. IMPRESSION: 1. Normal expected interval evolution of recently identified left posterior cerebral infarct. Minimal associated petechial blood products without hemorrhagic transformation or mass effect. 2. Few new subcentimeter acute to early subacute ischemic infarcts involving the contralateral right frontal lobe and right midbrain/pons as above. No associated hemorrhage or mass effect. 3. Underlying advanced atrophy with chronic small vessel ischemic disease with multiple  chronic infarcts as above. Electronically Signed   By: Rise Mu M.D.   On: 02/12/2023 23:06   CT HEAD WO CONTRAST ( )  Result Date: 02/12/2023 CLINICAL DATA:  New onset headache EXAM: CT HEAD WITHOUT CONTRAST TECHNIQUE: Contiguous axial images were obtained from the base of the skull through the vertex without intravenous contrast. RADIATION DOSE REDUCTION: This exam was performed according to the departmental dose-optimization program which includes automated exposure control, adjustment of the mA and/or kV according to patient size and/or use of iterative reconstruction technique. COMPARISON:  MRI head 01/24/2023 FINDINGS: Brain: No intracranial hemorrhage, mass effect, or evidence of acute infarct. No hydrocephalus. No extra-axial fluid collection. Advanced cerebral atrophy and chronic small vessel ischemic disease. Cerebral atrophy. Chronic infarcts including in the bilateral occipital lobes, left temporal lobe and bilateral cerebellar hemispheres. Vascular: No hyperdense vessel. Intracranial arterial calcification. Skull: No fracture or focal lesion. Sinuses/Orbits: No acute finding. Other: None. IMPRESSION: 1. No evidence of acute intracranial abnormality. 2. Cerebral atrophy and chronic small vessel ischemic disease and multiple chronic infarcts. Electronically Signed   By: Minerva Fester M.D.   On: 02/12/2023 22:54   DG Chest Port 1 View  Result Date: 02/12/2023 CLINICAL DATA:  Nausea and vomiting EXAM: PORTABLE CHEST 1 VIEW COMPARISON:  12/06/2022 FINDINGS: Sternotomy and CABG. AVR. Stable cardiomediastinal silhouette. Aortic atherosclerotic calcification. No focal consolidation, pleural effusion, or pneumothorax. No displaced rib fractures. IMPRESSION: No acute cardiopulmonary disease. Electronically Signed   By: Minerva Fester M.D.   On: 02/12/2023 21:18    Anti-infectives: Anti-infectives (From admission, onward)    None        Assessment/Plan SBO - Xray with contrast  in colon and patient passing flatus. Minimal NG output. D/c NG and start clear liquids. Advance diet as tolerated. Mobilize.   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    LOS: 1 day    Shane Holloway, [facility] Surgery 02/14/2023, 8:42 AM Please see Amion for pager number during day hours 7:00am-4:30pm

## 2023-02-14 NOTE — Progress Notes (Signed)
Physical Therapy Treatment Patient Details Name: Shane Holloway MRN: [id number] DOB: [birth year] Today's Date: 02/14/2023   History of Present Illness 72 y.o. male presented to [facility] ED on 9/4 with dizziness. Found to have multiple strokes in multiple different vascular territories. PMHx: recent CVA, HTN, HLD, chronic systolic/diastolic congestive heart failure, CKD stage IV, CAD s/p CABG, DM2, history of SVT, history of DVT on Eliquis, history of CVA, history of aortic valve repair, vascular dementia.    PT Comments  Denies further dizziness today, vertigo resolved, not provoked with dix-hallpike or horizontal roll tests. No vertical nystagmus but Rt beat nystagmus still present with Rt gaze. Pt required min assist for transfers and gait. Shows mild exacerbation of gait deficits, reliant on RW for support. I am familiar with this pt from his recent admission. He progressed quickly during hospitalization and was sent to SNF where he reached a Mod I level and returned home. Pt was mobilizing at home without an assistive device, and admittedly driving himself places. There seems to be a fair decline in his function from this level but I anticipate he will progress rapidly again with adequate intensive rehab. I suspect he could quickly reach a mod I level again with =/>3hr of therapy a day. He is amenable to this idea, but not keen on SNF. IF not a candidate for CIR, would consider encouraging SNF until he can mobilize again safely on his own. We will progress acutely. Patient will continue to benefit from skilled physical therapy services to further improve independence with functional mobility.    If plan is discharge home, recommend the following: Assistance with cooking/housework;Direct supervision/assist for medications management;Direct supervision/assist for financial management;Assist for transportation;Help with stairs or ramp for entrance;A little help with walking and/or transfers;A  little help with bathing/dressing/bathroom   Can travel by private vehicle     Yes  Equipment Recommendations  None recommended by PT    Recommendations for Other Services       Precautions / Restrictions Precautions Precautions: Fall Precaution Comments: N&V Restrictions Weight Bearing Restrictions: No     Mobility  Bed Mobility Overal bed mobility: Needs Assistance Bed Mobility: Supine to Sit     Supine to sit: Used rails, Contact guard     General bed mobility comments: CGA for safety, a bit effortful to rise but performed without physical assist today. Denies onset of dizziness    Transfers Overall transfer level: Needs assistance Equipment used: Rolling walker (2 wheels) Transfers: Sit to/from Stand Sit to Stand: Min assist, From elevated surface           General transfer comment: Min assist for boost to stand, slow and effortful, cues for hand placement. Reliant on RW for balance upon standing.    Ambulation/Gait Ambulation/Gait assistance: Min assist Gait Distance (Feet): 40 Feet Assistive device: Rolling walker (2 wheels) Gait Pattern/deviations: Decreased stance time - right, Decreased dorsiflexion - right, Decreased stride length, Step-through pattern, Ataxic, Shuffle, Trunk flexed, Wide base of support Gait velocity: decr Gait velocity interpretation: <1.31 ft/sec, indicative of household ambulator Pre-gait activities: weight shift and march in place with RW support General Gait Details: Min assist for balance and RW control with turns. Intermittent cues for walker placement/proximity to device and upright stance. Shuffling with mild ataxia. No overt buckling.   Stairs             Wheelchair Mobility     Tilt Bed    Modified Rankin (Stroke Patients Only) Modified Rankin (Stroke  Patients Only) Pre-Morbid Rankin Score: Slight disability Modified Rankin: Moderately severe disability     Balance Overall balance assessment: Needs  assistance Sitting-balance support: No upper extremity supported, Feet supported Sitting balance-Leahy Scale: Fair     Standing balance support: Reliant on assistive device for balance, Bilateral upper extremity supported Standing balance-Leahy Scale: Poor                              Cognition Arousal: Alert Behavior During Therapy: WFL for tasks assessed/performed Overall Cognitive Status: No family/caregiver present to determine baseline cognitive functioning                                 General Comments: Not oriented to year or month, but recalls what brought him into the hospital.        Exercises      General Comments General comments (skin integrity, edema, etc.): Denies vertigo today, no other dizziness reported with movements. Performed a modified dix-hallpike and horizontal roll test without nystagmus. Pt does have Rt beating unilateral nystagmus during Rt gaze. No vertical nystagmus noted.      Pertinent Vitals/Pain Pain Assessment Pain Assessment: No/denies pain    Home Living                          Prior Function            PT Goals (current goals can now be found in the care plan section) Acute Rehab PT Goals Patient Stated Goal: Get well PT Goal Formulation: With patient Time For Goal Achievement: 02/27/23 Potential to Achieve Goals: Good Progress towards PT goals: Progressing toward goals    Frequency    Min 1X/week      PT Plan      Co-evaluation              AM-PAC PT "6 Clicks" Mobility   Outcome Measure  Help needed turning from your back to your side while in a flat bed without using bedrails?: None Help needed moving from lying on your back to sitting on the side of a flat bed without using bedrails?: A Little Help needed moving to and from a bed to a chair (including a wheelchair)?: A Little Help needed standing up from a chair using your arms (e.g., wheelchair or bedside chair)?: A  Little Help needed to walk in hospital room?: A Little Help needed climbing 3-5 steps with a railing? 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

## 2023-02-14 NOTE — Progress Notes (Signed)
ANTICOAGULATION CONSULT NOTE    Pharmacy Consult for heparin Indication: hx DVT  Allergies  Allergen Reactions   Altace [Ramipril] Cough   Shellfish Allergy Nausea And Vomiting   Sulfa Antibiotics Hives    Patient Measurements: Height: 6\' 2"  (188 cm) Weight: 100 kg (220 lb 7.4 oz) IBW/kg (Calculated) : 82.2 Heparin Dosing Weight: 100kg  Vital Signs: Temp: 97.5 F (36.4 C) (09/06 0322) Temp Source: Oral (09/06 0322) BP: 186/105 (09/06 0322) Pulse Rate: 76 (09/06 0322)  Labs: Recent Labs    02/12/23 2034 02/12/23 2036 02/13/23 1929 02/14/23 0607  HGB 13.4  --   --  13.8  HCT 40.9  --   --  41.8  PLT 133*  --   --  146*  APTT  --  30 70* 80*  HEPARINUNFRC  --   --   --  >1.10*  CREATININE 3.24*  --   --  2.47*    Estimated Creatinine Clearance: 33.1 mL/min (A) (by C-G formula based on SCr of 2.47 mg/dL (H)).   Medical History: Past Medical History:  Diagnosis Date   Acute on chronic combined systolic and diastolic CHF (congestive heart failure) (HCC) 11/24/2008   Qualifier: Diagnosis of   By: Johnnette Barrios, CNA, Sandy         Acute renal failure with acute tubular necrosis superimposed on stage 3b chronic kidney disease (HCC) 12/15/2021   Allergy    Aortic stenosis    Arthritis    hands   BPH (benign prostatic hyperplasia)    CHF (congestive heart failure) (HCC)    CKD (chronic kidney disease)    COVID-19    COVID-19 virus infection 12/16/2021   Diabetes mellitus    Dyspnea    DYSPNEA 11/24/2008   Qualifier: Diagnosis of   By: Johnnette Barrios, CNA, Sandy         Hyperlipidemia    Hypertension    Hypertensive urgency 12/10/2021   Myocardial infarction [facility]) 06/10/2002   NSTEMI (non-ST elevated myocardial infarction) (HCC) 12/10/2021   Stroke (HCC) 09/2020   Some memory deficits   Vertigo    Wears dentures    full upper      Assessment: 72 yo M on apixaban PTA for hx LIJ DVT 11/2020. Last dose of apixaban 9/4 AM. Patient admitted for SBO and new CVA, holding  apixaban until return of bowel function. Pharmacy consulted for heparin.    aPTT therapeutic at 80 seconds. Heparin level still affected by apixaban. CBC stable.   Goal of Therapy:   Heparin level 0.3 -0.5 units/hr aPTT 66-85 seconds Monitor platelets by anticoagulation protocol: Yes   Plan:  Continue Heparin 1300 units/hr F/u aPTT until correlates with heparin level  Monitor daily aPTT, heparin level, CBC, signs/symptoms of bleeding  F/u restart apixaban  Alphia Moh, PharmD, BCPS, BCCP Clinical Pharmacist  Please check AMION for all [facility] Pharmacy phone numbers After 10:00 PM, call Main Pharmacy [phone]

## 2023-02-14 NOTE — Progress Notes (Addendum)
STROKE TEAM PROGRESS NOTE   BRIEF HPI [name] is a 72 y.o. 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 Patient had new onset vomiting at church yesterday. Presented to the ED which was significant for SBO. No new focal findings. Hospital day 1.  SIGNIFICANT HOSPITAL EVENTS 9/4: MRI Brain: normal expected evolution of left posterior cerebral infarct. Few new subcenitmeter acute to early subacute ischemic infarcts involving the contralateral right frontal lobe and right midbrain/pons. No hemorrhage/mass effect. Began on heparin prophylaxis. NGT placed. 9/5: MRA head and neck carried out. 9/6: VAS [place] Transcranial bubble doppler 8/17: negative. Will transition from heparin to Eliquis 5 BID   INTERIM HISTORY/SUBJECTIVE  NAEON. No refusals or PRNs. NG tube out. Diet: clears. Patient looking better this afternoon. Sitting up in chair. No new complaints. Residual RUE issues. Appears ready for discharge. Non-focal neuro exam.   OBJECTIVE  CBC    Component Value Date/Time   WBC 7.8 02/14/2023 0607   RBC 4.73 02/14/2023 0607   HGB 13.8 02/14/2023 0607   HCT 41.8 02/14/2023 0607   PLT 146 (L) 02/14/2023 0607   MCV 88.4 02/14/2023 0607   MCV 89.1 10/21/2012 1405   MCH 29.2 02/14/2023 0607   MCHC 33.0 02/14/2023 0607   RDW 14.7 02/14/2023 0607   LYMPHSABS 1.1 01/24/2023 1040   MONOABS 0.5 01/24/2023 1040   EOSABS 0.2 01/24/2023 1040   BASOSABS 0.1 01/24/2023 1040    BMET    Component Value Date/Time   NA 145 02/14/2023 0607   K 3.7 02/14/2023 0607   CL 110 02/14/2023 0607   CO2 22 02/14/2023 0607   GLUCOSE 123 (H) 02/14/2023 0607   BUN 26 (H) 02/14/2023 0607   CREATININE  2.47 (H) 02/14/2023 0607   CREATININE 0.79 09/23/2011 1201   CALCIUM 9.7 02/14/2023 0607   GFRNONAA 27 (L) 02/14/2023 0607    IMAGING past 24 hours DG Abd Portable 1V-Small Bowel Obstruction Protocol-initial, 8 hr delay  Result Date: 02/13/2023 CLINICAL DATA:  Small bowel obstruction. EXAM: PORTABLE ABDOMEN - 1 VIEW COMPARISON:  CT 02/12/2023 FINDINGS: Tip and side port of the enteric tube below the diaphragm in the stomach. There is enteric contrast throughout the colon. The prominent loops of small bowel persist in the central abdomen. Vascular calcifications. IMPRESSION: 1. Administered enteric contrast is seen throughout the colon. 2. Persistent prominent loops of small bowel in the central abdomen, favor ileus in the setting. Electronically Signed   By: Narda Rutherford M.D.   On: 02/13/2023 22:58    Vitals:   02/14/23 0322 02/14/23 0822 02/14/23 1113 02/14/23 1205  BP: (!) 186/105 (!) 173/99 (!) 176/96   Pulse: 76 79 83   Resp: 18 19 20 14   Temp: (!) 97.5 F (36.4 C) 98.1 F (36.7 C) 98.4 F (36.9 C)   TempSrc: Oral Oral Oral   SpO2: 93% 95% 95%   Weight:      Height:         PHYSICAL EXAM General:  Alert, well-nourished, well-developed patient in no acute distress Psych:  Mood and affect appropriate for situation CV: Regular rate and rhythm on monitor Respiratory:  Regular, unlabored respirations on room air GI: Abdomen soft and nontender  NEURO:  Mental Status: AA&Ox3, patient is able to give clear and coherent history Speech/Language: speech is largely intact.  Naming, repetition, fluency, and comprehension intact.  Cranial Nerves:  III, IV, VI: EOMI.  VII: Face is symmetrical resting and smiling VIII: hearing intact to voice. IX, X: Palate elevates symmetrically. Phonation is somewhat slurred ZO:[id number] shrug 5/5. XII: tongue is midline without fasciculations. Motor: 5/5 strength to all muscle groups tested.  Tone: is normal and bulk is normal Sensation:  decreased in RLE, intact to light touch bilaterally otherwise. Gait: deferred   ASSESSMENT/PLAN  Sudden-onset nausea/vomiting ISO recent L posterior cerebral infarct and new CVAs to contralateral R frontal lobe, pons, midbrain  Etiology:  Uncontrolled hypertension +/- suspected Afib or paradoxical embolli  Neurological exam improved from yesterday. Believe source of N/V likely 2/2 SBO etiology rather than as a result of new stroke findings. MRA H/N showed no LVO. Previous VAS [place] Transcranial Doppler with Bubbles 8/17 negative. Will begin Eliquis 5 mg for stroke prophylaxis for 3 months and discontinue IV Heparin today. May need Holter monitor afterwards. Neuro stroke will sign off at this time.    CT head: No acute abnormality. Cerebral atrophy and chronic small vessel ischemic disease and multiple chronic infarcts. CT A/P w/contrast: Intermediate-high grade SBO  MRI Brain: New sub-centimeter acute/early subacute ischemic infarcts in contralateral right frontal lobe and right midbrain/pons. Advanced atrophy. MRA H/N without contrast:  VAS [place] Transcranial Doppler w Bubbles: negative on 8/17.  2D Echo (8/17): No intracardiac source of embolism detected. LDL 29 HgbA1c 8.8 VTE prophylaxis - Restarting home Eliquis 5.  Therapy recommendations: TBD, PT/OT physical exam limited by N/V Disposition:  TBD  Concern for Atrial fibrillation vs paradoxical emboli ISO DVT hx Recommend 30-day Holter monitor if VAS [place] transcranial doppler w/bubbles rules out paradoxical emboli Home Meds: Eliquis 5 BID, aspirin 81 mg Continue telemetry monitoring Continue IV heparin drip, DVTs.   Hx of Stroke/TIA Started Eliquis 12/2020 for left internal jugular and subclavian DVT. Hematology:  Recently admitted to [facility] hospital (8/16-8/19) for stroke ISO Eliquis stoppage.  Hypertension Home meds: Entresto 49-51 BID, metoprolol succinate 50 mg Elevated, stable Blood Pressure Goal: BP less than 220/110   Hx of  SVT Home Amidarone 200 mg po QD  Hyperlipidemia Home meds:  ezitimibe 10 mg every day  LDL 29, goal < 70  Diabetes type II Uncontrolled Home meds:  Levemir 12u at bedtime, NPH 10u BID, Semaglutide 1mg /wk, empagliflozin 10 mg every day  HgbA1c 8.8, goal < 7.0 CBGs Sensitive SSI Recommend close follow-up with PCP for better DM control  Other Stroke Risk Factors Coronary artery disease Advanced age  Hospital day # 1 I have personally obtained history,examined this patient, reviewed notes, independently viewed imaging studies, participated in medical decision making and plan of care.ROS completed by me personally and pertinent positives fully documented  I have made any additions or clarifications directly to the above note. Agree with note above.  Patient is tolerating oral diet now.  Changed from IV heparin drip to Eliquis.  Maintain aggressive risk factor modification.  Stroke team will sign off.  Kindly call for questions.  Discussed with Dr. Raelyn Number, MD Medical Director [facility] Pager: [phone] 02/14/2023 1:18 PM

## 2023-02-14 NOTE — Progress Notes (Signed)
  Progress Note   Patient: Shane Holloway ZOX:[id number] DOB: 1950/10/27 DOA: 02/12/2023     1 DOS: the patient was seen and examined on 02/14/2023 at 10:37AM      Brief hospital course: Mr. Crittenden is a 72 y.o. M with sdCHF EF recovered to 55-60%, CKD IV baseline 2.8, CAD s/p CABG and AV repair, DM, hx SVT, hx DVT on Eliquis, and recent admission for stroke in the setting of holding Eliquis due to dental bleeding who presented with vomiting and abdominal pain.  In the ER, CT showed SBO.  Also found incidentally to have new embolic appearing strokes.     Assessment and Plan: * SBO (small bowel obstruction) (HCC) Contrast through to colon on serial images, NG removed this morning 9/6 - ADAT - Stop fluids    Acute CVA (cerebrovascular accident) (HCC) Unclear if Eliquis failure.  Given multiple strokes in different vascular territories, suspect central embolic source  MRA head and neck unremarkable.  TCD and echo bubble study 2 weeks ago normal. - Resume Eliquis - Continue Lipitor and Zetia - Needs 30 day event monitor   CKD IV Creatinine stable relative to baseline  Diabetes -Resume home Levemir - Continue sliding scale corrections  History SVT - Continue amiodarone  Hypertension Coronary disease Chronic heart failure with mildly reduced ejection fraction Not on diuretic at home.  Use appears euvolemic - Resume home Entresto, metoprolol  BPH - Continue Flomax and finasteride  History of DVT - Resume Eliquis  Hypokalemia Resolved        Subjective: Patient is feeling a lot better, no abdominal pain, no fever, no confusion, no focal weakness.     Physical Exam: BP (!) 158/94 (BP Location: Left Arm)   Pulse 72   Temp 97.7 F (36.5 C) (Oral)   Resp 19   Ht 6\' 2"  (1.88 m)   Wt 100 kg   SpO2 94%   BMI 28.31 kg/m   Old male, sitting up in recliner, eating breakfast RRR, no murmurs, no peripheral edema Respiratory rate normal, lungs clear without  rales or wheezes Abdomen soft no tenderness palpation or guarding Mild beating nystagmus when looking to the right, no other abnormal neurological findings    Data Reviewed: Discussed with neurology Basic metabolic panel shows no abnormal electrolytes renal function CBC stable  Family Communication: None present    Disposition: Status is: Inpatient         Author: Alberteen Sam, MD 02/14/2023 6:05 PM  For on call review [email or web address].

## 2023-02-14 NOTE — Plan of Care (Signed)
  Problem: Education: Goal: Knowledge of disease or condition will improve Outcome: Progressing Goal: Knowledge of secondary prevention will improve (MUST DOCUMENT ALL) Outcome: Progressing   Problem: Ischemic Stroke/TIA Tissue Perfusion: Goal: Complications of ischemic stroke/TIA will be minimized Outcome: Progressing   Problem: Coping: Goal: Will verbalize positive feelings about self Outcome: Progressing   Problem: Health Behavior/Discharge Planning: Goal: Ability to manage health-related needs will improve Outcome: Progressing   Problem: Self-Care: Goal: Ability to participate in self-care as condition permits will improve Outcome: Progressing Goal: Verbalization of feelings and concerns over difficulty with self-care will improve Outcome: Progressing Goal: Ability to communicate needs accurately will improve Outcome: Progressing   Problem: Nutrition: Goal: Risk of aspiration will decrease Outcome: Progressing Goal: Dietary intake will improve Outcome: Progressing   Problem: Education: Goal: Knowledge of General Education information will improve Description: Including pain rating scale, medication(s)/side effects and non-pharmacologic comfort measures Outcome: Progressing   Problem: Health Behavior/Discharge Planning: Goal: Ability to manage health-related needs will improve Outcome: Progressing   Problem: Clinical Measurements: Goal: Ability to maintain clinical measurements within normal limits will improve Outcome: Progressing Goal: Will remain free from infection Outcome: Progressing Goal: Cardiovascular complication will be avoided Outcome: Progressing   Problem: Activity: Goal: Risk for activity intolerance will decrease Outcome: Progressing   Problem: Coping: Goal: Level of anxiety will decrease Outcome: Progressing

## 2023-02-14 NOTE — PMR Pre-admission (Signed)
PMR Admission Coordinator Pre-Admission Assessment  Patient: Shane Holloway is an 72 y.o., male MRN: [id number] DOB: 1950/08/29 Height: 6\' 2"  (188 cm) Weight: 100 kg  Insurance Information HMO: yes    PPO:      PCP:      IPA:      80/20:      OTHER:  PRIMARY: [employer]   Policy #: [id number], Medicare: [id number]  Subscriber: patient CM Name: Danella Penton      Phone#: [phone] [id number]     Fax#: [phone] Pre-Cert#: [id number]            Employer: [employer] called with approval for admit 9/10 for 9/10-9/16.  benefitsPhone: n/a - online at [email or web address]   Eff Date: 07/11/2022- still active   Deductible: does not have one OOP Max: $7,750 ($2,990 met)   CIR: $335/day copay with max copay of $2,010/admission (6 days) SNF: $0/day co-pay for days 1-20, $203/day co-pay for days 21-100, limited to 100 days/cal yr Outpatient:  $25 copay/visit   Home Health:  100% coverage   DME: 80% coverage; 20% co-insurance   Providers: in-network DOB [birth year]   SECONDARY:       Policy#:      Phone#:   Artist:       Phone#:   The "Data Collection Information Summary" for patients in Inpatient Rehabilitation Facilities with attached "Privacy Act Statement-Health Care Records" was provided and verbally reviewed with: Patient and Family  Emergency Contact Information Contact Information     Name Relation Home Work Mobile   Dodson Brother [phone]  [phone]      Other Contacts     Name Relation Home Work Mobile   Raymore Niece [phone]         Current Medical History  Patient Admitting Diagnosis: SBO and CVA History of Present Illness: Pt is a 72 year old male with medical hx significant for: h/o DVT, h/o aortic valve repair, CKD, HTN, hyperlipidemia, CABG, h/o SVT, DM II, CAD, acute CVA (~2 weeks prior). Pt presented to [facility] on 02/11/22 d/t nausea, vomiting, and dizziness. MRI showed new subcentimeter acute to early  subacute ischemic infarcts involving contralateral right frontal lobe and right midbrain/pons. CT abdomen/pelvis revealed SBO. MRA head/neck was negative for LVO.  Pt. With CKD stage IV Baseline appears to be 2.5-3.4. Was 3.2 on admission, improved to 2.7 with fluids Developed significant orthostatic drop in blood pressure 9/9, was Treated with IV fluids.  Entresto on hold. Therapy evaluations completed and CIR recommended d/t pt's deficits in functional mobility. Complete NIHSS TOTAL: 1  Patient's medical record from [facility] has been reviewed by the rehabilitation admission coordinator and physician.  Past Medical History  Past Medical History:  Diagnosis Date   Acute on chronic combined systolic and diastolic CHF (congestive heart failure) (HCC) 11/24/2008   Qualifier: Diagnosis of   By: Johnnette Barrios, CNA, Sandy         Acute renal failure with acute tubular necrosis superimposed on stage 3b chronic kidney disease (HCC) 12/15/2021   Allergy    Aortic stenosis    Arthritis    hands   BPH (benign prostatic hyperplasia)    CHF (congestive heart failure) (HCC)    CKD (chronic kidney disease)    COVID-19    COVID-19 virus infection 12/16/2021   Diabetes mellitus    Dyspnea    DYSPNEA 11/24/2008   Qualifier: Diagnosis of   By: Johnnette Barrios CNA, Andrey Campanile  Hyperlipidemia    Hypertension    Hypertensive urgency 12/10/2021   Myocardial infarction [facility]) 06/10/2002   NSTEMI (non-ST elevated myocardial infarction) (HCC) 12/10/2021   Stroke (HCC) 09/2020   Some memory deficits   Vertigo    Wears dentures    full upper    Has the patient had major surgery during 100 days prior to admission? No  Family History   family history includes Heart disease in his father and mother.  Current Medications  Current Facility-Administered Medications:    amiodarone (PACERONE) tablet 200 mg, 200 mg, Oral, Daily, Danford, Earl Lites, MD, 200 mg at 02/17/23 0945   apixaban (ELIQUIS) tablet 5  mg, 5 mg, Oral, BID, Tomie China, MD, 5 mg at 02/17/23 0945   atorvastatin (LIPITOR) tablet 80 mg, 80 mg, Oral, Daily, Danford, Earl Lites, MD, 80 mg at 02/17/23 0945   empagliflozin (JARDIANCE) tablet 10 mg, 10 mg, Oral, Daily, Ghimire, Kuber, MD   ezetimibe (ZETIA) tablet 10 mg, 10 mg, Oral, Daily, Danford, Earl Lites, MD, 10 mg at 02/17/23 0945   finasteride (PROSCAR) tablet 5 mg, 5 mg, Oral, Daily, Danford, Earl Lites, MD, 5 mg at 02/17/23 0944   insulin aspart (novoLOG) injection 0-15 Units, 0-15 Units, Subcutaneous, TID WC, Danford, Earl Lites, MD, 5 Units at 02/17/23 1231   insulin aspart (novoLOG) injection 0-5 Units, 0-5 Units, Subcutaneous, QHS, Danford, Christopher P, MD   insulin detemir (LEVEMIR) injection 8 Units, 8 Units, Subcutaneous, QHS, Danford, Earl Lites, MD, 8 Units at 02/16/23 2146   iohexol (OMNIPAQUE) 350 MG/ML injection 50 mL, 50 mL, Intravenous, Once PRN, Charlynne Pander, MD   metoprolol succinate (TOPROL-XL) 24 hr tablet 50 mg, 50 mg, Oral, Daily, Danford, Earl Lites, MD, 50 mg at 02/17/23 0946   metoprolol tartrate (LOPRESSOR) injection 5 mg, 5 mg, Intravenous, Q6H PRN, Danford, Earl Lites, MD   ondansetron (ZOFRAN) injection 4 mg, 4 mg, Intravenous, Q6H PRN, Danford, Earl Lites, MD, 4 mg at 02/14/23 0003   Oral care mouth rinse, 15 mL, Mouth Rinse, PRN, Danford, Earl Lites, MD   promethazine (PHENERGAN) 12.5 mg in sodium chloride 0.9 % 50 mL IVPB, 12.5 mg, Intravenous, Q6H PRN, Danford, Earl Lites, MD  Patients Current Diet:  Diet Order             DIET SOFT Room service appropriate? Yes; Fluid consistency: Thin  Diet effective now                   Precautions / Restrictions Precautions Precautions: Fall Precaution Comments: orthostatic hypotension; BP goal <220/110 Restrictions Weight Bearing Restrictions: No   Has the patient had 2 or more falls or a fall with injury in the past year? No  Prior Activity  Level Limited Community (1-2x/wk): gets out of house ~1x/week, mainly medical appointments  Prior Functional Level Self Care: Did the patient need help bathing, dressing, using the toilet or eating? Independent  Indoor Mobility: Did the patient need assistance with walking from room to room (with or without device)? Independent  Stairs: Did the patient need assistance with internal or external stairs (with or without device)? Independent  Functional Cognition: Did the patient need help planning regular tasks such as shopping or remembering to take medications? Needed some help  Patient Information Are you of Hispanic, Latino/a,or Spanish origin?: A. No, not of Hispanic, Latino/a, or Spanish origin What is your race?: A. White Do you need or want an interpreter to communicate with a doctor or health care staff?:  0. No  Patient's Response To:  Health Literacy and Transportation Is the patient able to respond to health literacy and transportation needs?: Yes Health Literacy - How often do you need to have someone help you when you read instructions, pamphlets, or other written material from your doctor or pharmacy?: Sometimes In the past 12 months, has lack of transportation kept you from medical appointments or from getting medications?: No In the past 12 months, has lack of transportation kept you from meetings, work, or from getting things needed for daily living?: No  Journalist, newspaper / Equipment Home Assistive Devices/Equipment: None Home Equipment: Agricultural consultant (2 wheels), [employer] - single point, Wheelchair - manual, Information systems manager  Prior Device Use: Indicate devices/aids used by the patient prior to current illness, exacerbation or injury? Walker and cane  Current Functional Level Cognition  Overall Cognitive Status: No family/caregiver present to determine baseline cognitive functioning Orientation Level: Oriented to person, Oriented to place, Oriented to situation General  Comments: following one step commands inconsistently- a little dealy in processing    Extremity Assessment (includes Sensation/Coordination)  Upper Extremity Assessment: Generalized weakness, RUE deficits/detail RUE Deficits / Details: Strength overall 4/5. However, dominant R UE strength decreased compared to L UE.  Lower Extremity Assessment: Defer to PT evaluation RLE Deficits / Details: Rt knee extension 4/5, Rt ankle DF 4/5 RLE:  (Unable to fully assess due to N&V)    ADLs  Overall ADL's : Needs assistance/impaired Eating/Feeding: Set up, Sitting Grooming: Set up, Sitting Upper Body Bathing: Minimal assistance, Cueing for safety, Cueing for sequencing, Sitting Upper Body Dressing : Minimal assistance, Sitting Toilet Transfer: Moderate assistance, Cueing for safety, Cueing for sequencing, Rolling walker (2 wheels) Toilet Transfer Details (indicate cue type and reason): simulated during transfer from eob with pivotal steps to the recliner Toileting- Clothing Manipulation and Hygiene: Maximal assistance, Sit to/from stand Toileting - Clothing Manipulation Details (indicate cue type and reason): rn present during session and assisted pt. clean up of buttocks and hip area Functional mobility during ADLs: Minimal assistance, Moderate assistance, Cueing for sequencing, Rolling walker (2 wheels), Cueing for safety General ADL Comments: cues for sequencing and hand placement prior to sitting down, increased time for pt. to follow the one step commands for hand placement    Mobility  Overal bed mobility: Needs Assistance Bed Mobility: Supine to Sit, Sit to Supine Supine to sit: Used rails, Contact guard Sit to supine: Contact guard assist General bed mobility comments: Able to bring LEs out of and back into bed today with CGA. Cues to facilitate.    Transfers  Overall transfer level: Needs assistance Equipment used: Rolling walker (2 wheels) Transfers: Sit to/from Stand, Bed to  chair/wheelchair/BSC Sit to Stand: Mod assist Bed to/from chair/wheelchair/BSC transfer type:: Step pivot Step pivot transfers: Mod assist General transfer comment: Deferred due to symptomatic orthostatic hypotension    Ambulation / Gait / Stairs / Wheelchair Mobility  Ambulation/Gait Ambulation/Gait assistance: Editor, commissioning (Feet): 35 Feet (x2 trials with seated break in bathroom) Assistive device: Rolling walker (2 wheels) Gait Pattern/deviations: Decreased stance time - right, Decreased dorsiflexion - right, Decreased stride length, Step-through pattern, Ataxic, Shuffle, Trunk flexed, Wide base of support General Gait Details: Min assist for balance and RW control with turns. Intermittent cues for walker placement/proximity to device and upright stance. RLE foot drop with slightly improved step length/height with constant cues for sequencing/posture. Pt may benefit from RLE AFO pending progress next session; pt able to somewhat DF RLE  while seated but poor DF ROM against gravity during gait trial. Gait velocity: decr Gait velocity interpretation: <1.31 ft/sec, indicative of household ambulator Pre-gait activities: weight shift and march in place with RW support    Posture / Balance Balance Overall balance assessment: Needs assistance Sitting-balance support: No upper extremity supported, Feet supported Sitting balance-Leahy Scale: Fair Standing balance support: Bilateral upper extremity supported, During functional activity, Reliant on assistive device for balance Standing balance-Leahy Scale: Poor Standing balance comment: RW reliant, CGA for static standing due to dizziness    Special needs/care consideration Continuous Drip IV  lactated ringers 1000 mL with potassium chloride infusion and Diabetic management Novolog 0-9 units every 4 hours   Previous Home Environment (from acute therapy documentation) Living Arrangements: Alone  Lives With: Alone Available Help  at Discharge: Family, Friend(s), Available 24 hours/day Type of Home: Mobile home Home Layout: One level Home Access: Ramped entrance Bathroom Shower/Tub: Health visitor: Standard Bathroom Accessibility: Yes How Accessible: Accessible via walker Home Care Services: No Additional Comments: States he went to rehab after d/c from hosptial last month and recently returned home with HHPT. Has been ambulating without assistive device and is independent with ADLs.  Discharge Living Setting Plans for Discharge Living Setting: Patient's home Type of Home at Discharge: Mobile home Discharge Home Layout: One level Discharge Home Access: Ramped entrance Discharge Bathroom Shower/Tub: Walk-in shower Discharge Bathroom Toilet: Standard Discharge Bathroom Accessibility: Yes How Accessible: Accessible via walker Does the patient have any problems obtaining your medications?: No  Social/Family/Support Systems Anticipated Caregiver: Victoriano Lain (niece), [name] (friend), [name] (nephew) Anticipated Caregiver's Contact Information: [name]: [phone]: [phone] Caregiver Availability: 24/7 Discharge Plan Discussed with Primary Caregiver: Yes Is Caregiver In Agreement with Plan?: Yes Does Caregiver/Family have Issues with Lodging/Transportation while Pt is in Rehab?: No  Goals Patient/Family Goal for Rehab: Supervision: PT/OT Expected length of stay: 7-10 days Pt/Family Agrees to Admission and willing to participate: Yes Program Orientation Provided & Reviewed with Pt/Caregiver Including Roles  & Responsibilities: Yes  Decrease burden of Care through IP rehab admission: NA  Possible need for SNF placement upon discharge: Not anticipated  Patient Condition: I have reviewed medical records from [facility], spoken with CM, and patient and family member. I met with patient at the bedside and discussed via phone for inpatient rehabilitation assessment.   Patient will benefit from ongoing PT and OT, can actively participate in 3 hours of therapy a day 5 days of the week, and can make measurable gains during the admission.  Patient will also benefit from the coordinated team approach during an Inpatient Acute Rehabilitation admission.  The patient will receive intensive therapy as well as Rehabilitation physician, nursing, social worker, and care management interventions.  Due to safety, disease management, medication administration, pain management, and patient education the patient requires 24 hour a day rehabilitation nursing.  The patient is currently min-mod A with mobility and basic ADLs.  Discharge setting and therapy post discharge at home with home health is anticipated.  Patient has agreed to participate in the Acute Inpatient Rehabilitation Program and will admit today.  Preadmission Screen Completed By:  Domingo Pulse, 02/17/2023 4:10 PM ______________________________________________________________________   Discussed status with Dr. Berline Chough  on 02/18/23 at 1012 and received approval for admission today.  Admission Coordinator:  Domingo Pulse, CCC-SLP, time 1012/Date 02/18/23   Assessment/Plan: Diagnosis: embolic strokes- L posterior circulation and R frontal lobe Does the need for close, 24 hr/day Medical  supervision in concert with the patient's rehab needs make it unreasonable for this patient to be served in a less intensive setting? Yes Co-Morbidities requiring supervision/potential complications: CHF, CKD 4, CAD/CABG, AV repair, DVT was on Eliquis; DM; SVT; recent SBO Due to bladder management, bowel management, safety, skin/wound care, disease management, medication administration, pain management, and patient education, does the patient require 24 hr/day rehab nursing? Yes Does the patient require coordinated care of a physician, rehab nurse, PT, OT, and SLP to address physical and functional deficits in the context of  the above medical diagnosis(es)? Yes Addressing deficits in the following areas: balance, endurance, locomotion, strength, transferring, bowel/bladder control, bathing, dressing, feeding, grooming, toileting, and cognition Can the patient actively participate in an intensive therapy program of at least 3 hrs of therapy 5 days a week? Yes The potential for patient to make measurable gains while on inpatient rehab is good Anticipated functional outcomes upon discharge from inpatient rehab: supervision PT, supervision OT, n/a SLP Estimated rehab length of stay to reach the above functional goals is: 7-10 days Anticipated discharge destination: Home 10. Overall Rehab/Functional Prognosis: good   MD Signature:

## 2023-02-14 NOTE — Progress Notes (Signed)
   Inpatient Rehab Admissions Coordinator :  Per  PT therapy recommendations, patient was screened for CIR candidacy by Ottie Glazier RN MSN.  At this time patient appears to be a potential candidate for CIR. I will place a rehab consult per protocol for full assessment. Please call me with any questions.  Ottie Glazier RN MSN Admissions Coordinator [phone]

## 2023-02-14 NOTE — Progress Notes (Addendum)
Inpatient Rehab Admissions:  Inpatient Rehab Consult received.  I met with patient at the bedside for rehabilitation assessment and to discuss goals and expectations of an inpatient rehab admission.  Discussed average length of stay, insurance authorization requirement, discharge home after completion of CIR. Pt acknowledged understanding. Pt gave permission to contact family. Spoke with niece [name] on the telephone. She also acknowledged understanding of CIR goals and expectations. She is supportive of pt pursuing CIR. She confirmed that pt will have 24/7 support from family (she and brother Octavius) and friends [name]). She gave [facility] Lesley's number. Called [facility] and left a message. Awaiting return call. Will continue to follow.  1444: Spoke with pt's nephew Teriq. He also acknowledged understanding of CIR goals and expectations. He is also supportive of pt pursuing CIR. He also confirmed that pt will have support from family and friends after discharge.  Signed: Wolfgang Phoenix, MS, CCC-SLP Admissions Coordinator [phone]

## 2023-02-15 DIAGNOSIS — N400 Enlarged prostate without lower urinary tract symptoms: Secondary | ICD-10-CM | POA: Diagnosis not present

## 2023-02-15 DIAGNOSIS — K56609 Unspecified intestinal obstruction, unspecified as to partial versus complete obstruction: Secondary | ICD-10-CM | POA: Diagnosis not present

## 2023-02-15 DIAGNOSIS — I639 Cerebral infarction, unspecified: Secondary | ICD-10-CM | POA: Diagnosis not present

## 2023-02-15 DIAGNOSIS — N184 Chronic kidney disease, stage 4 (severe): Secondary | ICD-10-CM | POA: Diagnosis not present

## 2023-02-15 DIAGNOSIS — I951 Orthostatic hypotension: Secondary | ICD-10-CM | POA: Insufficient documentation

## 2023-02-15 LAB — GLUCOSE, CAPILLARY
Glucose-Capillary: 184 mg/dL — ABNORMAL HIGH (ref 70–99)
Glucose-Capillary: 219 mg/dL — ABNORMAL HIGH (ref 70–99)
Glucose-Capillary: 248 mg/dL — ABNORMAL HIGH (ref 70–99)
Glucose-Capillary: 162 mg/dL — ABNORMAL HIGH (ref 70–99)

## 2023-02-15 LAB — BASIC METABOLIC PANEL WITH GFR
Anion gap: 8 (ref 5–15)
BUN: 29 mg/dL — ABNORMAL HIGH (ref 8–23)
CO2: 26 mmol/L (ref 22–32)
Calcium: 9.2 mg/dL (ref 8.9–10.3)
Chloride: 104 mmol/L (ref 98–111)
Creatinine, Ser: 2.69 mg/dL — ABNORMAL HIGH (ref 0.61–1.24)
GFR, Estimated: 24 mL/min — ABNORMAL LOW
Glucose, Bld: 206 mg/dL — ABNORMAL HIGH (ref 70–99)
Potassium: 3.5 mmol/L (ref 3.5–5.1)
Sodium: 138 mmol/L (ref 135–145)

## 2023-02-15 LAB — CBC
HCT: 43.2 % (ref 39.0–52.0)
Hemoglobin: 14 g/dL (ref 13.0–17.0)
MCH: 28.9 pg (ref 26.0–34.0)
MCHC: 32.4 g/dL (ref 30.0–36.0)
MCV: 89.3 fL (ref 80.0–100.0)
Platelets: 151 K/uL (ref 150–400)
RBC: 4.84 MIL/uL (ref 4.22–5.81)
RDW: 14.5 % (ref 11.5–15.5)
WBC: 6.7 K/uL (ref 4.0–10.5)
nRBC: 0 % (ref 0.0–0.2)

## 2023-02-15 MED ORDER — SODIUM CHLORIDE 0.9 % IV BOLUS
1000.0000 mL | Freq: Once | INTRAVENOUS | Status: AC
  Administered 2023-02-15: 1000 mL via INTRAVENOUS

## 2023-02-15 MED ORDER — LACTATED RINGERS IV SOLN: INTRAVENOUS | Status: DC

## 2023-02-15 NOTE — Assessment & Plan Note (Signed)
-   Continue Entresto, metop - Not on diuretic - HOld Jardiance

## 2023-02-15 NOTE — Assessment & Plan Note (Addendum)
Baseline appears to be 2.5-3.4.  Was 3.2 on admission, improved to 2.4 with fluids - Trend BMP

## 2023-02-15 NOTE — Progress Notes (Signed)
Progress Note   Patient: Shane Holloway WGN:[id number] DOB: [birth year] DOA: 02/12/2023     2 DOS: the patient was seen and examined on 02/15/2023 at 8:17 AM      Brief hospital course: Mr. Bansal is a 72 y.o. M with sdCHF EF recovered to 55-60%, CKD IV baseline 2.8, CAD s/p CABG and AV repair, DM, hx SVT, hx DVT on Eliquis, and recent admission for stroke in the setting of holding Eliquis due to dental bleeding who presented with vomiting and abdominal pain.  In the ER, CT showed SBO.  Also found incidentally to have new embolic appearing strokes.     Assessment and Plan: * SBO (small bowel obstruction) (HCC) Initial presenting complaint.  Admitted and had NG tube placed.  General surgery consulted.  Underwent small bowel protocol, and contrast was observed to pass to the colon.  NG removed, and he is advanced to solid diet without difficulty, general surgery have signed off and his symptoms appear resolved.      Acute CVA (cerebrovascular accident) (HCC) Incidental finding.  MRI shows multiple strokes in different vascular territories, suspect central embolic source - Non-invasive angiography showed no clear source, no clinically significant carotid disease - Recent echo unremarkable, Neurology recommended not repeating - TCD and echo bubble recently done as well and were both negative - Lipids done recently, patient is on Lipitor 80 and Zetia - Aspirin not indicated, currently on chronic Apixaban - Evaluation for arrhythmia/atrial fibrillation: none on monitoring - tPA not given because outside window - Dysphagia screen ordered in ER - PT eval ordered: CIR - Nonsmoker      CKD (chronic kidney disease), stage IV (HCC) Baseline appears to be 2.5-3.4.  Was 3.2 on admission, improved to 2.4 with fluids - Trend BMP  Orthostatic hypotension BP dropped 130-110 9/7 with standing.  Likely hypovolemic after vomiting, NG tube. - IV fluids and repeat orthostatic  vitals  Hypokalemia - Supplement K  History of DVT (deep vein thrombosis) - Continue home apixaban  Chronic HFrEF (heart failure with reduced ejection fraction) (HCC) - Continue Entresto, metop - Not on diuretic - HOld Jardiance  Vascular dementia without behavioral disturbance (HCC)    Benign prostatic hyperplasia - Continue FLomax, finasteride  CORONARY ATHEROSCLEROSIS, ARTERY BYPASS GRAFT - Continue Lipitor, Zetia, apixaban, metoprolol - Hold Jardiance, aspirin  Essential hypertension BP elevated at rest, see below - Continue metoprolol, Entresto  Uncontrolled type 2 diabetes mellitus with hyperglycemia, without long-term current use of insulin (HCC) Glucose controlled.   - Continue levemir - Continue SS correction insulin - Hold Jardiance for now          Subjective: Patient is orthostatic with PT today, but appetite is good, no abdominal pain or vomiting.  No speech change, no confusion, no new neurological findings.     Physical Exam: BP (!) 150/90 (BP Location: Right Arm)   Pulse 63   Temp 97.8 F (36.6 C) (Oral)   Resp (!) 21   Ht 6\' 2"  (1.88 m)   Wt 100 kg   SpO2 100%   BMI 28.31 kg/m   Elderly adult male, sitting up in bed, eating breakfast, interactive and appropriate.  Some forgetfulness about what he has had for breakfast.  Similar to baseline. RRR, no murmurs, no peripheral edema Respiratory rate normal, lungs clear without rales or wheezes Abdomen soft no tenderness palpation or guarding Strength symmetric in upper and lower extremities bilaterally, memory impairment noted, nystagmus when looking to the right is improved.  Data Reviewed: Discussed with general surgery Creatinine pending  Family Communication: None present    Disposition: Status is: Inpatient The patient is orthostatic today, but expect he will improve tomorrow and can go to a CIR tomorrow        Author: Alberteen Sam, MD 02/15/2023 1:26  PM  For on call review [email or web address].

## 2023-02-15 NOTE — Progress Notes (Signed)
Physical Therapy Treatment Patient Details Name: Shane Holloway MRN: [id number] DOB: May 10, 1951 Today's Date: 02/15/2023   History of Present Illness 72 y.o. male presented to [facility] ED on 9/4 with dizziness. Found to have multiple strokes in multiple different vascular territories. PMHx: recent CVA, HTN, HLD, chronic systolic/diastolic congestive heart failure, CKD stage IV, CAD s/p CABG, DM2, history of SVT, history of DVT on Eliquis, history of CVA, history of aortic valve repair, vascular dementia.    PT Comments  Pt received in supine, agreeable to therapy session with good participation and tolerance for seated exercises and shorter household distance gait trials with RW. Standing/gait tolerance somewhat limited due to evolving c/o nausea and lightheadedness, pt found to have symptomatic orthostatic hypotension, MD notified. Pt also with continued RLE foot drop (per chart review this has been issue since last admission in Aug and pt states he does not have brace for RLE) and would benefit from outside vendor consult for AFO, MD ordered brace. Pt needing up to minA to perform functional mobility tasks this date with mod cues for safety/sequencing tasks. Pt continues to benefit from PT services to progress toward functional mobility goals.     02/15/23 1130  Vital Signs  Patient Position (if appropriate) Orthostatic Vitals  Orthostatic Sitting  BP- Sitting (!) 134/91  Pulse- Sitting 77  Orthostatic Standing at 3 minutes  BP- Standing at 3 minutes 107/69  Pulse- Standing at 3 minutes 86     If plan is discharge home, recommend the following: Assistance with cooking/housework;Direct supervision/assist for medications management;Direct supervision/assist for financial management;Assist for transportation;Help with stairs or ramp for entrance;A little help with walking and/or transfers;A little help with bathing/dressing/bathroom   Can travel by private vehicle     Yes   Equipment Recommendations  None recommended by PT    Recommendations for Other Services Rehab consult     Precautions / Restrictions Precautions Precautions: Fall Precaution Comments: orthostatic hypotension; BP goal <220/110 Restrictions Weight Bearing Restrictions: No     Mobility  Bed Mobility Overal bed mobility: Needs Assistance Bed Mobility: Supine to Sit     Supine to sit: Used rails, Contact guard     General bed mobility comments: CGA for safety, a bit effortful to rise but performed without physical assist today, pt heavily using rail    Transfers Overall transfer level: Needs assistance Equipment used: Rolling walker (2 wheels) Transfers: Sit to/from Stand Sit to Stand: Min assist, From elevated surface           General transfer comment: Min assist for boost to stand, slow and effortful, cues for hand placement. Reliant on RW for balance upon standing.    Ambulation/Gait Ambulation/Gait assistance: Min assist Gait Distance (Feet): 35 Feet (x2 trials with seated break in bathroom) Assistive device: Rolling walker (2 wheels) Gait Pattern/deviations: Decreased stance time - right, Decreased dorsiflexion - right, Decreased stride length, Step-through pattern, Ataxic, Shuffle, Trunk flexed, Wide base of support Gait velocity: decr     General Gait Details: Min assist for balance and RW control with turns. Intermittent cues for walker placement/proximity to device and upright stance. RLE foot drop with slightly improved step length/height with constant cues for sequencing/posture. Pt may benefit from RLE AFO pending progress next session; pt able to somewhat DF RLE while seated but poor DF ROM against gravity during gait trial.   Stairs             Wheelchair Mobility     Tilt Bed  Modified Rankin (Stroke Patients Only) Modified Rankin (Stroke Patients Only) Pre-Morbid Rankin Score: Slight disability Modified Rankin: Moderately severe  disability     Balance Overall balance assessment: Needs assistance Sitting-balance support: No upper extremity supported, Feet supported Sitting balance-Leahy Scale: Fair     Standing balance support: Bilateral upper extremity supported, During functional activity, Reliant on assistive device for balance Standing balance-Leahy Scale: Poor Standing balance comment: RW reliant, CGA for static standing due to dizziness                            Cognition Arousal: Alert Behavior During Therapy: WFL for tasks assessed/performed Overall Cognitive Status: No family/caregiver present to determine baseline cognitive functioning                                 General Comments: Appears A&O x3-4, decreased apparent awareness of deficits, participates well, able to make his needs known mostly but requires prompting to admit to symptoms of orthostatic hypotension.        Exercises General Exercises - Lower Extremity Ankle Circles/Pumps: AROM, Both, AAROM, 10 reps, Seated, Limitations, Other (comment) Ankle Circles/Pumps Limitations: decreased RLE dorsiflexion, MD notified 9/7 he would benefit from AFO brace Long Arc Quad: AROM, Both, 10 reps, Seated Hip Flexion/Marching: AROM, Both, 10 reps, Seated Other Exercises Other Exercises: chair push-ups x10 reps using arms and legs    General Comments General comments (skin integrity, edema, etc.): BP seated 134/91 (103) HR 77 bpm at EOB initially (not dizzy), BP 107/69 (82) HR 86 bpm standing after ~3 mins, RN/MD notified. Could try TED hose next session if still orthostatic?      Pertinent Vitals/Pain Pain Assessment Pain Assessment: No/denies pain Pain Intervention(s): Monitored during session, Repositioned     PT Goals (current goals can now be found in the care plan section) Acute Rehab PT Goals Patient Stated Goal: Get well PT Goal Formulation: With patient Time For Goal Achievement: 02/27/23 Progress  towards PT goals: Progressing toward goals    Frequency    Min 1X/week      PT Plan         AM-PAC PT "6 Clicks" Mobility   Outcome Measure  Help needed turning from your back to your side while in a flat bed without using bedrails?: A Little Help needed moving from lying on your back to sitting on the side of a flat bed without using bedrails?: A Little Help needed moving to and from a bed to a chair (including a wheelchair)?: A Little Help needed standing up from a chair using your arms (e.g., wheelchair or bedside chair)?: A Little Help needed to walk in hospital room?: A Lot (mod cues) Help needed climbing 3-5 steps with a railing? : Total (too orthostatic today) 6 Click Score: 15    End of Session Equipment Utilized During Treatment: Gait belt Activity Tolerance: Patient tolerated treatment well;Treatment limited secondary to medical complications (Comment);Other (comment) (orthostatic hypotension) Patient left: with call bell/phone within reach;in chair;with chair alarm set Nurse Communication: Mobility status;Other (comment) (symptomatic orthostatic hypotension, pt needs RLE AFO brace (foot drop since first CVA in Aug)) PT Visit Diagnosis: Hemiplegia and hemiparesis;History of falling (Z91.81);Difficulty in walking, not elsewhere classified (R26.2);Dizziness and giddiness (R42);Other symptoms and signs involving the nervous system (R29.898);Other abnormalities of gait and mobility (R26.89) Hemiplegia - Right/Left: Right Hemiplegia - caused by: Cerebral infarction  Time: 1884-1660 PT Time Calculation (min) (ACUTE ONLY): 23 min  Charges:    $Gait Training: 8-22 mins $Therapeutic Exercise: 8-22 mins PT General Charges $$ ACUTE PT VISIT: 1 Visit                     Vonne Mcdanel P., PTA Acute Rehabilitation Services Secure Chat Preferred 9a-5:30pm Office: [phone]    Dorathy Kinsman [facility] 02/15/2023, 3:34 PM

## 2023-02-15 NOTE — Assessment & Plan Note (Signed)
-   Continue Lipitor, Zetia, apixaban, metoprolol - Hold Jardiance, aspirin

## 2023-02-15 NOTE — Assessment & Plan Note (Signed)
-   Continue home apixaban 

## 2023-02-15 NOTE — Assessment & Plan Note (Signed)
Resolved with supplementation and starting spironolactone. 

## 2023-02-15 NOTE — Progress Notes (Incomplete)
Incomplete     Expand All Collapse All PMR Admission Coordinator Pre-Admission Assessment   Patient: Shane Holloway is an 72 y.o., male MRN: [id number] DOB: 1950-08-16 Height: 6\' 2"  (188 cm) Weight: 100 kg   Insurance Information HMO: yes    PPO:      PCP:      IPA:      80/20:      OTHER:  PRIMARY: [employer]      Policy#: [id number]      Subscriber: patient CM Name: ***      Phone#: ***     Fax#: *** Pre-Cert#: ***      Employer: *** Benefits:  Phone #: ***     Name: *** Dolores Hoose. Date: ***     Deduct: ***      Out of Pocket Max: ***      Life Max: *** CIR: ***      SNF: *** Outpatient: ***     Co-Pay: *** Home Health: ***      Co-Pay: *** DME: ***     Co-Pay: *** Providers: in-network SECONDARY:       Policy#:      Phone#:    Financial Counselor:       Phone#:    The Data processing manager" for patients in Inpatient Rehabilitation Facilities with attached "Privacy Act Statement-Health Care Records" was provided and verbally reviewed with: {CHL IP Patient Family LO:[id number]}   Emergency Contact Information Contact Information       Name Relation Home Work Mobile    [place] Brother [phone]   [phone]         Other Contacts       Name Relation Home Work Mobile    [place] Niece [phone]               Current Medical History  Patient Admitting Diagnosis: SBO and CVA History of Present Illness: Pt is a 72 year old male with medical hx significant for: h/o DVT, h/o aortic valve repair, CKD, HTN, hyperlipidemia, CABG, h/o SVT, DM II, CAD, acute CVA (~2 weeks prior). Pt presented to [facility] on 02/11/22 d/t nausea, vomiting, and dizziness. MRI showed new subcentimeter acute to early subacute ischemic infarcts involving contralateral right frontal lobe and right midbrain/pons. CT abdomen/pelvis revealed SBO. MRA head/neck was negative for LVO. CIR recommended d/t pt's deficits in functional mobility. Complete NIHSS TOTAL: 1    Patient's medical record from [facility] has been reviewed by the rehabilitation admission coordinator and physician.   Past Medical History      Past Medical History:  Diagnosis Date   Acute on chronic combined systolic and diastolic CHF (congestive heart failure) (HCC) 11/24/2008    Qualifier: Diagnosis of   By: Johnnette Barrios, CNA, Sandy         Acute renal failure with acute tubular necrosis superimposed on stage 3b chronic kidney disease (HCC) 12/15/2021   Allergy     Aortic stenosis     Arthritis      hands   BPH (benign prostatic hyperplasia)     CHF (congestive heart failure) (HCC)     CKD (chronic kidney disease)     COVID-19     COVID-19 virus infection 12/16/2021   Diabetes mellitus     Dyspnea     DYSPNEA 11/24/2008    Qualifier: Diagnosis of   By: Johnnette Barrios, CNA, Sandy         Hyperlipidemia     Hypertension  Hypertensive urgency 12/10/2021   Myocardial infarction [facility]) 06/10/2002   NSTEMI (non-ST elevated myocardial infarction) (HCC) 12/10/2021   Stroke (HCC) 09/2020    Some memory deficits   Vertigo     Wears dentures      full upper          Has the patient had major surgery during 100 days prior to admission? No   Family History   family history includes Heart disease in his father and mother.   Current Medications  Current Medications    Current Facility-Administered Medications:    apixaban (ELIQUIS) tablet 5 mg, 5 mg, Oral, BID, Tomie China, MD, 5 mg at 02/14/23 1402   insulin aspart (novoLOG) injection 0-9 Units, 0-9 Units, Subcutaneous, Q4H, Danford, Earl Lites, MD, 2 Units at 02/14/23 1153   iohexol (OMNIPAQUE) 350 MG/ML injection 50 mL, 50 mL, Intravenous, Once PRN, Charlynne Pander, MD   lactated ringers 1,000 mL with potassium chloride 20 mEq infusion, , Intravenous, Continuous, Danford, Earl Lites, MD, Last Rate: 125 mL/hr at 02/14/23 0620, New Bag at 02/14/23 0620   metoprolol tartrate (LOPRESSOR) injection 5 mg, 5 mg,  Intravenous, Q6H PRN, Danford, Earl Lites, MD   ondansetron (ZOFRAN) injection 4 mg, 4 mg, Intravenous, Q6H PRN, Danford, Earl Lites, MD, 4 mg at 02/14/23 0003   Oral care mouth rinse, 15 mL, Mouth Rinse, PRN, Danford, Earl Lites, MD   promethazine (PHENERGAN) 12.5 mg in sodium chloride 0.9 % 50 mL IVPB, 12.5 mg, Intravenous, Q6H PRN, Danford, Earl Lites, MD     Patients Current Diet:  Diet Order                  Diet full liquid Room service appropriate? Yes; Fluid consistency: Thin  Diet effective now                         Precautions / Restrictions Precautions Precautions: Fall Precaution Comments: N&V Restrictions Weight Bearing Restrictions: No    Has the patient had 2 or more falls or a fall with injury in the past year? No   Prior Activity Level Limited Community (1-2x/wk): gets out of house ~1x/week, mainly medical appointments   Prior Functional Level Self Care: Did the patient need help bathing, dressing, using the toilet or eating? Independent   Indoor Mobility: Did the patient need assistance with walking from room to room (with or without device)? Independent   Stairs: Did the patient need assistance with internal or external stairs (with or without device)? Independent   Functional Cognition: Did the patient need help planning regular tasks such as shopping or remembering to take medications? Needed some help   Patient Information Are you of Hispanic, Latino/a,or Spanish origin?: A. No, not of Hispanic, Latino/a, or Spanish origin What is your race?: A. White Do you need or want an interpreter to communicate with a doctor or health care staff?: 0. No   Patient's Response To:  Health Literacy and Transportation Is the patient able to respond to health literacy and transportation needs?: Yes Health Literacy - How often do you need to have someone help you when you read instructions, pamphlets, or other written material from your doctor or  pharmacy?: Sometimes In the past 12 months, has lack of transportation kept you from medical appointments or from getting medications?: No In the past 12 months, has lack of transportation kept you from meetings, work, or from getting things needed for daily living?: No  Home Assistive Devices / Equipment Home Assistive Devices/Equipment: None Home Equipment: Agricultural consultant (2 wheels), [employer] - single point, Wheelchair - manual, Information systems manager   Prior Device Use: Indicate devices/aids used by the patient prior to current illness, exacerbation or injury? Walker and cane   Current Functional Level Cognition   Overall Cognitive Status: No family/caregiver present to determine baseline cognitive functioning Orientation Level: Disoriented to time General Comments: Not oriented to year or month, but recalls what brought him into the hospital.    Extremity Assessment (includes Sensation/Coordination)   Upper Extremity Assessment: Generalized weakness, RUE deficits/detail RUE Deficits / Details: Strength overall 4/5. However, dominant R UE strength decreased compared to L UE.  Lower Extremity Assessment: Defer to PT evaluation RLE Deficits / Details: Rt knee extension 4/5, Rt ankle DF 4/5 RLE:  (Unable to fully assess due to N&V)     ADLs   Overall ADL's : Needs assistance/impaired Eating/Feeding: Set up, Sitting Grooming: Set up, Sitting Upper Body Bathing: Minimal assistance, Cueing for safety, Cueing for sequencing, Sitting Upper Body Dressing : Minimal assistance, Sitting General ADL Comments: session limited by ongoing nausea     Mobility   Overal bed mobility: Needs Assistance Bed Mobility: Supine to Sit Supine to sit: Used rails, Contact guard Sit to supine: Contact guard assist General bed mobility comments: CGA for safety, a bit effortful to rise but performed without physical assist today. Denies onset of dizziness     Transfers   Overall transfer level: Needs  assistance Equipment used: Rolling walker (2 wheels) Transfers: Sit to/from Stand Sit to Stand: Min assist, From elevated surface General transfer comment: Min assist for boost to stand, slow and effortful, cues for hand placement. Reliant on RW for balance upon standing.     Ambulation / Gait / Stairs / Wheelchair Mobility   Ambulation/Gait Ambulation/Gait assistance: Editor, commissioning (Feet): 40 Feet Assistive device: Rolling walker (2 wheels) Gait Pattern/deviations: Decreased stance time - right, Decreased dorsiflexion - right, Decreased stride length, Step-through pattern, Ataxic, Shuffle, Trunk flexed, Wide base of support General Gait Details: Min assist for balance and RW control with turns. Intermittent cues for walker placement/proximity to device and upright stance. Shuffling with mild ataxia. No overt buckling. Gait velocity: decr Gait velocity interpretation: <1.31 ft/sec, indicative of household ambulator Pre-gait activities: weight shift and march in place with RW support     Posture / Balance Balance Overall balance assessment: Needs assistance Sitting-balance support: No upper extremity supported, Feet supported Sitting balance-Leahy Scale: Fair Standing balance support: Reliant on assistive device for balance, Bilateral upper extremity supported Standing balance-Leahy Scale: Poor     Special needs/care consideration Continuous Drip IV  lactated ringers 1000 mL with potassium chloride infusion and Diabetic management Novolog 0-9 units every 4 hours    Previous Home Environment (from acute therapy documentation) Living Arrangements: Alone  Lives With: Alone Available Help at Discharge: Family, Friend(s), Available 24 hours/day Type of Home: Mobile home Home Layout: One level Home Access: Ramped entrance Bathroom Shower/Tub: Health visitor: Standard Bathroom Accessibility: Yes How Accessible: Accessible via walker Home Care Services:  No Additional Comments: States he went to rehab after d/c from hosptial last month and recently returned home with HHPT. Has been ambulating without assistive device and is independent with ADLs.   Discharge Living Setting Plans for Discharge Living Setting: Patient's home Type of Home at Discharge: Mobile home Discharge Home Layout: One level Discharge Home Access: Ramped entrance Discharge Bathroom Shower/Tub: Walk-in shower  Discharge Bathroom Toilet: Standard Discharge Bathroom Accessibility: Yes How Accessible: Accessible via walker Does the patient have any problems obtaining your medications?: No   Social/Family/Support Systems Anticipated Caregiver: Victoriano Lain (niece), [name] (friend), [name] (nephew) Anticipated Caregiver's Contact Information: [name]: [phone]: [phone] Caregiver Availability: 24/7 Discharge Plan Discussed with Primary Caregiver: Yes Is Caregiver In Agreement with Plan?: Yes Does Caregiver/Family have Issues with Lodging/Transportation while Pt is in Rehab?: No   Goals Patient/Family Goal for Rehab: Supervision: PT/OT Expected length of stay: 7-10 days Pt/Family Agrees to Admission and willing to participate: Yes Program Orientation Provided & Reviewed with Pt/Caregiver Including Roles  & Responsibilities: Yes   Decrease burden of Care through IP rehab admission: NA   Possible need for SNF placement upon discharge: Not anticipated   Patient Condition: I have reviewed medical records from [facility], spoken with CM, and patient and family member. I met with patient at the bedside and discussed via phone for inpatient rehabilitation assessment.  Patient will benefit from ongoing PT and OT, can actively participate in 3 hours of therapy a day 5 days of the week, and can make measurable gains during the admission.  Patient will also benefit from the coordinated team approach during an Inpatient Acute Rehabilitation admission.  The  patient will receive intensive therapy as well as Rehabilitation physician, nursing, social worker, and care management interventions.  Due to safety, disease management, medication administration, pain management, and patient education the patient requires 24 hour a day rehabilitation nursing.  The patient is currently Min A  with mobility and Min A with basic ADLs.  Discharge setting and therapy post discharge at home with home health is anticipated.  Patient has agreed to participate in the Acute Inpatient Rehabilitation Program and will admit {Time; today/tomorrow:10263}.   Preadmission Screen Completed By:  Domingo Pulse, 02/14/2023 2:43 PM _________________________________________________

## 2023-02-15 NOTE — Assessment & Plan Note (Signed)
-   Continue FLomax, finasteride

## 2023-02-15 NOTE — Progress Notes (Signed)
Patient ID: Shane Holloway, male   DOB: 04-03-1951, 72 y.o.   MRN: [id number] Sbo resolved, ok for cir

## 2023-02-15 NOTE — Assessment & Plan Note (Signed)
BP elevated at rest, see below - Continue metoprolol, Sherryll Burger

## 2023-02-15 NOTE — Progress Notes (Signed)
Orthopedic Tech Progress Note Patient Details:  Shane Holloway Oct 20, 1950 [id number] AFO Consult has been ordered from [facility].  Patient ID: [name], male   DOB: [birth year], 72 y.o.   MRN: [id number]  Smitty Pluck 02/15/2023, 3:47 PM

## 2023-02-15 NOTE — Assessment & Plan Note (Signed)
BP dropped 130-110 9/7 with standing.  Likely hypovolemic after vomiting, NG tube. - IV fluids and repeat orthostatic vitals

## 2023-02-15 NOTE — Assessment & Plan Note (Addendum)
Glucose controlled.   - Continue levemir - Continue SS correction insulin - Hold Jardiance for now

## 2023-02-16 DIAGNOSIS — K56609 Unspecified intestinal obstruction, unspecified as to partial versus complete obstruction: Secondary | ICD-10-CM

## 2023-02-16 LAB — BASIC METABOLIC PANEL
Anion gap: 13 (ref 5–15)
BUN: 31 mg/dL — ABNORMAL HIGH (ref 8–23)
CO2: 23 mmol/L (ref 22–32)
Calcium: 9 mg/dL (ref 8.9–10.3)
Chloride: 102 mmol/L (ref 98–111)
Creatinine, Ser: 2.72 mg/dL — ABNORMAL HIGH (ref 0.61–1.24)
GFR, Estimated: 24 mL/min — ABNORMAL LOW (ref >60)
Glucose, Bld: 173 mg/dL — ABNORMAL HIGH (ref 70–99)
Potassium: 3.5 mmol/L (ref 3.5–5.1)
Sodium: 138 mmol/L (ref 135–145)

## 2023-02-16 LAB — CBC
HCT: 37.8 % — ABNORMAL LOW (ref 39.0–52.0)
Hemoglobin: 12.4 g/dL — ABNORMAL LOW (ref 13.0–17.0)
MCH: 28 pg (ref 26.0–34.0)
MCHC: 32.8 g/dL (ref 30.0–36.0)
MCV: 85.3 fL (ref 80.0–100.0)
Platelets: 122 10*3/uL — ABNORMAL LOW (ref 150–400)
RBC: 4.43 MIL/uL (ref 4.22–5.81)
RDW: 13.9 % (ref 11.5–15.5)
WBC: 5.3 10*3/uL (ref 4.0–10.5)
nRBC: 0 % (ref 0.0–0.2)

## 2023-02-16 LAB — GLUCOSE, CAPILLARY
Glucose-Capillary: 163 mg/dL — ABNORMAL HIGH (ref 70–99)
Glucose-Capillary: 205 mg/dL — ABNORMAL HIGH (ref 70–99)
Glucose-Capillary: 152 mg/dL — ABNORMAL HIGH (ref 70–99)
Glucose-Capillary: 182 mg/dL — ABNORMAL HIGH (ref 70–99)

## 2023-02-16 NOTE — Progress Notes (Signed)
Progress Note   Patient: Shane Holloway YQM:[id number] DOB: 1950/07/04 DOA: 02/12/2023     3 DOS: the patient was seen and examined on 02/16/2023 at 8:17 AM      Brief hospital course: Mr. Dasari is a 72 y.o. M with sdCHF EF recovered to 55-60%, CKD IV baseline 2.8, CAD s/p CABG and AV repair, DM, hx SVT, hx DVT on Eliquis, and recent admission for stroke in the setting of holding Eliquis due to dental bleeding who presented with vomiting and abdominal pain.  In the ER, CT showed SBO.  Also found incidentally to have new embolic appearing strokes.  Treated conservatively.  Small bowel obstruction relieved.  Waiting to go to acute inpatient rehab.   Assessment and Plan: * SBO (small bowel obstruction) (HCC) Initial presenting complaint.  Admitted and had NG tube placed.  General surgery consulted.  Underwent small bowel protocol, and contrast was observed to pass to the colon.  NG removed, and he is advanced to solid diet without difficulty, general surgery have signed off and his symptoms appear resolved.      Acute CVA (cerebrovascular accident) (HCC) Incidental finding.  MRI shows multiple strokes in different vascular territories, suspect central embolic source - Non-invasive angiography showed no clear source, no clinically significant carotid disease - Recent echo unremarkable, Neurology recommended not repeating - TCD and echo bubble recently done as well and were both negative - Lipids done recently, patient is on Lipitor 80 and Zetia - Aspirin not indicated, currently on chronic Apixaban - Evaluation for arrhythmia/atrial fibrillation: none on monitoring - tPA not given because outside window - PT eval ordered: CIR - Nonsmoker     CKD (chronic kidney disease), stage IV (HCC) Baseline appears to be 2.5-3.4.  Was 3.2 on admission, improved to 2.7 with fluids  Orthostatic hypotension Developed significant orthostatic drop in blood pressure.  Patient continue to receive  antihypertensives including Entresto while being sick.  Treated with IV fluids overnight. Patient did have orthostatic drop today, however he was asymptomatic. Discontinue IV fluids.  Hold Entresto until clinical improvement.  Hypokalemia - Adequate.    DVT (deep vein thrombosis) - Continue home apixaban  Chronic HFrEF (heart failure with reduced ejection fraction) (HCC) - Continue metoprolol.  Hold Entresto. - Not on diuretic - HOld Jardiance  Vascular dementia without behavioral disturbance (HCC)    Benign prostatic hyperplasia - Continue FLomax, finasteride  CORONARY ATHEROSCLEROSIS, ARTERY BYPASS GRAFT - Continue Lipitor, Zetia, apixaban, metoprolol - Hold Jardiance, aspirin  Essential hypertension BP elevated at rest, see below - Continue metoprolol, Entresto  Uncontrolled type 2 diabetes mellitus with hyperglycemia, without long-term current use of insulin (HCC) Glucose controlled.   - Continue levemir - Continue SS correction insulin - Hold Jardiance for now          Subjective: Patient seen and examined.  He just finished working with physical therapy.  He had 30 point drop in systolic blood pressure, however he did not feel any dizziness or lightheadedness.  Denies any complaints.   Physical Exam: BP (!) 153/90 (BP Location: Left Arm)   Pulse 69   Temp 97.9 F (36.6 C) (Oral)   Resp 16   Ht 6\' 2"  (1.88 m)   Wt 100 kg   SpO2 95%   BMI 28.31 kg/m    General: Looks comfortable. Cardiovascular: S1-S2 normal.  Regular rate rhythm. Respiratory: Bilateral clear.  No added sounds. Gastrointestinal: Soft.  Nontender bowel sound present. Ext: No swelling or edema. Neuro: Intact. Musculoskeletal:  No deformities.      Data Reviewed:  Family Communication: None present    Disposition: Status is: Inpatient Stable to transfer to rehab.      Total time spent: 35 minutes  Author: Dorcas Carrow, MD 02/16/2023 12:03 PM  For on call review  [email or web address].

## 2023-02-16 NOTE — Progress Notes (Signed)
Occupational Therapy Treatment Patient Details Name: Shane Holloway MRN: [id number] DOB: 18-Sep-1950 Today's Date: 02/16/2023   History of present illness 72 y.o. male presented to [facility] ED on 9/4 with dizziness. Found to have multiple strokes in multiple different vascular territories. PMHx: recent CVA, HTN, HLD, chronic systolic/diastolic congestive heart failure, CKD stage IV, CAD s/p CABG, DM2, history of SVT, history of DVT on Eliquis, history of CVA, history of aortic valve repair, vascular dementia.   OT comments  Pt. Seen for skilled OT treatment.  Able to complete bed mobility with CGA.  Required mod a for sit/stand and step pivot transfers with one step cueing for safety and sequencing.  Increased time and inconsistent with following one step commands.  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  Remains good candidate for AIR level therapies.    ORTHOSTATIC BPS TAKEN DURING SESSION-pt. Reports not being symptomatic during the fluctuations  SUPINE: 152/90-108 (67) SIT: 135/95-107 (72) STAND: 108/74-85 (79)        If plan is discharge home, recommend the following:  Two people to help with walking and/or transfers;A lot of help with bathing/dressing/bathroom;Assistance with cooking/housework;Assistance with feeding;Direct supervision/assist for medications management;Direct supervision/assist for financial management;Assist for transportation;Help with stairs or ramp for entrance;Supervision due to cognitive status   Equipment Recommendations       Recommendations for Other Services      Precautions / Restrictions Precautions Precautions: Fall Precaution Comments: orthostatic hypotension; BP goal <220/110       Mobility Bed Mobility Overal bed mobility: Needs Assistance Bed Mobility: Supine to Sit     Supine to sit: Used rails, Contact guard          Transfers Overall transfer level: Needs assistance Equipment  used: Rolling walker (2 wheels) Transfers: Sit to/from Stand, Bed to chair/wheelchair/BSC Sit to Stand: Mod assist     Step pivot transfers: Mod assist     General transfer comment: mod assist for boost to stand, slow and effortful, cues for hand placement. Reliant on RW for balance upon standing.     Balance                                           ADL either performed or assessed with clinical judgement   ADL Overall ADL's : Needs assistance/impaired                         Toilet Transfer: Moderate assistance;Cueing for safety;Cueing for sequencing;Rolling walker (2 wheels) Toilet Transfer Details (indicate cue type and reason): simulated during transfer from eob with pivotal steps to the recliner Toileting- Clothing Manipulation and Hygiene: Maximal assistance;Sit to/from stand Toileting - Clothing Manipulation Details (indicate cue type and reason): rn present during session and assisted pt. clean up of buttocks and hip area     Functional mobility during ADLs: Minimal assistance;Moderate assistance;Cueing for sequencing;Rolling walker (2 wheels);Cueing for safety General ADL Comments: cues for sequencing and hand placement prior to sitting down, increased time for pt. to follow the one step commands for hand placement    Extremity/Trunk Assessment              Vision       Perception     Praxis      Cognition Arousal: Alert Behavior During Therapy: [name] for tasks assessed/performed Overall Cognitive  Status: No family/caregiver present to determine baseline cognitive functioning                                 General Comments: following one step commands inconsistently-reviewed i needed to take bps in supine/sit/stand prior to getting to chair for b.fast. pt. stated "ok" but when i returned to room with bp machine he was reaching and leaning out of bed trying to open his oatmeal and eat.  attempting to get oob even  though i reviewed i needed him to stay laying down.        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  Reports he has a brother that is actively dying with hospice care.      Pertinent Vitals/ Pain       Pain Assessment Pain Assessment: No/denies pain  Home Living                                          Prior Functioning/Environment              Frequency           Progress Toward Goals  OT Goals(current goals can now be found in the care plan section)  Progress towards OT goals: Progressing toward goals     Plan      Co-evaluation                 AM-PAC OT "6 Clicks" Daily Activity     Outcome Measure   Help from another person eating meals?: A Little Help from another person taking care of personal grooming?: A Little Help from another person toileting, which includes using toliet, bedpan, or urinal?: A Lot Help from another person bathing (including washing, rinsing, drying)?: A Lot Help from another person to put on and taking off regular upper body clothing?: A Little Help from another person to put on and taking off regular lower body clothing?: A Lot 6 Click Score: 15    End of Session Equipment Utilized During Treatment: Gait belt;Rolling walker (2 wheels)  OT Visit Diagnosis: Unsteadiness on feet (R26.81);Other abnormalities of gait and mobility (R26.89);History of falling (Z91.81);Other (comment)   Activity Tolerance Patient tolerated treatment well   Patient Left in chair;with call bell/phone within reach;with chair alarm set   Nurse Communication Other (comment) (rn present during session, provided the orthostatic bps taken during session for her to log)        Time: 7062-3762 OT Time Calculation (min): 14 min  Charges: OT General Charges $OT Visit: 1 Visit OT Treatments $Self Care/Home Management : 8-22 mins  Boneta Lucks,  COTA/L Acute Rehabilitation [phone]   Shane Holloway-COTA/L 02/16/2023, 9:03 AM

## 2023-02-17 DIAGNOSIS — K56609 Unspecified intestinal obstruction, unspecified as to partial versus complete obstruction: Secondary | ICD-10-CM | POA: Diagnosis not present

## 2023-02-17 LAB — CBC
HCT: 37.4 % — ABNORMAL LOW (ref 39.0–52.0)
Hemoglobin: 12.5 g/dL — ABNORMAL LOW (ref 13.0–17.0)
MCH: 29.2 pg (ref 26.0–34.0)
MCHC: 33.4 g/dL (ref 30.0–36.0)
MCV: 87.4 fL (ref 80.0–100.0)
Platelets: 115 10*3/uL — ABNORMAL LOW (ref 150–400)
RBC: 4.28 MIL/uL (ref 4.22–5.81)
RDW: 13.7 % (ref 11.5–15.5)
WBC: 5.8 10*3/uL (ref 4.0–10.5)
nRBC: 0 % (ref 0.0–0.2)

## 2023-02-17 LAB — BASIC METABOLIC PANEL
Anion gap: 8 (ref 5–15)
BUN: 33 mg/dL — ABNORMAL HIGH (ref 8–23)
CO2: 23 mmol/L (ref 22–32)
Calcium: 8.4 mg/dL — ABNORMAL LOW (ref 8.9–10.3)
Chloride: 104 mmol/L (ref 98–111)
Creatinine, Ser: 2.77 mg/dL — ABNORMAL HIGH (ref 0.61–1.24)
GFR, Estimated: 23 mL/min — ABNORMAL LOW (ref >60)
Glucose, Bld: 164 mg/dL — ABNORMAL HIGH (ref 70–99)
Potassium: 3.3 mmol/L — ABNORMAL LOW (ref 3.5–5.1)
Sodium: 135 mmol/L (ref 135–145)

## 2023-02-17 LAB — GLUCOSE, CAPILLARY
Glucose-Capillary: 141 mg/dL — ABNORMAL HIGH (ref 70–99)
Glucose-Capillary: 219 mg/dL — ABNORMAL HIGH (ref 70–99)
Glucose-Capillary: 188 mg/dL — ABNORMAL HIGH (ref 70–99)
Glucose-Capillary: 184 mg/dL — ABNORMAL HIGH (ref 70–99)

## 2023-02-17 MED ORDER — POTASSIUM CHLORIDE 20 MEQ PO PACK
40.0000 meq | PACK | Freq: Once | ORAL | Status: AC
  Administered 2023-02-17: 40 meq via ORAL
  Filled 2023-02-17: qty 2

## 2023-02-17 MED ORDER — EMPAGLIFLOZIN 10 MG PO TABS
10.0000 mg | ORAL_TABLET | Freq: Every day | ORAL | Status: DC
  Administered 2023-02-17 – 2023-02-18 (×2): 10 mg via ORAL
  Filled 2023-02-17 (×2): qty 1

## 2023-02-17 MED ORDER — SODIUM CHLORIDE 0.9 % IV BOLUS
1000.0000 mL | Freq: Once | INTRAVENOUS | Status: AC
  Administered 2023-02-17: 1000 mL via INTRAVENOUS

## 2023-02-17 MED ORDER — POTASSIUM CHLORIDE CRYS ER 20 MEQ PO TBCR
40.0000 meq | EXTENDED_RELEASE_TABLET | Freq: Once | ORAL | Status: AC
  Filled 2023-02-17: qty 2

## 2023-02-17 NOTE — Progress Notes (Signed)
Inpatient Rehab Admissions Coordinator:  The [employer] company is offering a Audiological scientist. Dr. Jerral Ralph is aware. Will continue to follow.   Wolfgang Phoenix, MS, CCC-SLP Admissions Coordinator [phone]

## 2023-02-17 NOTE — Progress Notes (Addendum)
Progress Note   Patient: Shane Holloway ZOX:[id number] DOB: Mar 02, 1951 DOA: 02/12/2023     4 DOS: the patient was seen and examined on 02/17/2023 at 8:17 AM      Brief hospital course: [name] is a 72 y.o. M with sdCHF EF recovered to 55-60%, CKD IV baseline 2.8, CAD s/p CABG and AV repair, DM, hx SVT, hx DVT on Eliquis, and recent admission for stroke in the setting of holding Eliquis due to dental bleeding who presented with vomiting and abdominal pain.  In the ER, CT showed SBO.  Also found to have new embolic appearing strokes.  Treated conservatively.  Stroke made him more weaker.  Small bowel obstruction relieved.  Waiting to go to acute inpatient rehab.   Assessment and Plan: * SBO (small bowel obstruction) (HCC) Initial presenting complaint.  Admitted and had NG tube placed. General surgery consulted. Clinically improved.  Currently tolerating regular diet with normal bowel function.  Acute CVA (cerebrovascular accident) (HCC) MRI shows multiple strokes in different vascular territories, suspect central embolic source - Non-invasive angiography showed no clear source, no clinically significant carotid disease - Recent echo unremarkable, Neurology recommended not repeating - TCD and echo bubble recently done as well and were both negative - Lipids done recently, patient is on Lipitor 80 and Zetia - Aspirin not indicated, currently on chronic Apixaban - Evaluation for arrhythmia/atrial fibrillation: none on monitoring - tPA not given because outside window - PT eval ordered: CIR - Nonsmoker   -Having a new stroke left him with more weakness and difficulty coordination.  Will benefit with acute inpatient rehab.  CKD (chronic kidney disease), stage IV (HCC) Baseline appears to be 2.5-3.4.  Was 3.2 on admission, improved to 2.7 with fluids  Orthostatic hypotension Developed significant orthostatic drop in blood pressure.  Treated with IV fluids.  Entresto on hold.  Still  significant drop in systolic blood pressure, however he is not having any orthostatic symptoms including dizziness or lightheadedness.  Will monitor without IV fluid and Entresto and continue to check orthostatics.  Addendum, patient had significant drop in blood pressure from supine to sitting.  He felt uncomfortable and could not stand to take his standing blood pressure. Will give him 1 additional liter of IV fluid today.  Hold Flomax.  Hypokalemia - Adequate.    DVT (deep vein thrombosis) - Continue home apixaban  Chronic HFrEF (heart failure with reduced ejection fraction) (HCC) - Continue metoprolol.  Hold Entresto. - Not on diuretic - HOld Jardiance  Benign prostatic hyperplasia - Continue FLomax, finasteride.  These medicines can cause orthostatic symptoms, he has significant urinary issues if stopped.  Will monitor.  CORONARY ATHEROSCLEROSIS, ARTERY BYPASS GRAFT - Continue Lipitor, Zetia, apixaban, metoprolol - Hold Jardiance, aspirin  Essential hypertension BP fairly controlled.  On metoprolol.  Holding Entresto  Uncontrolled type 2 diabetes mellitus with hyperglycemia, without long-term current use of insulin (HCC) Glucose controlled.   - Continue levemir - Continue SS correction insulin - Hold Jardiance for now  Patient is medically stabilizing.  Does have significant medical issues, difficulty mobility and required rehab potential before going home with family.  Insurance has asked for peer to peer for authorization to go to CIR, waiting to get call from [facility].         Subjective:  Patient seen and examined.  Denies any complaints.  Patient tells me that he wants to go home to see his brother who is dying.  He insisted on going home with home  health PT OT.  Called and discussed with his primary caretakers who are his niece and nephew and they recommend him to go to rehab.  Patient agreed. Patient did have some drop in orthostatics, however without any  dizziness or lightheadedness.  Physical Exam: BP 131/78 (BP Location: Left Arm)   Pulse 66   Temp 98.3 F (36.8 C) (Oral)   Resp 18   Ht 6\' 2"  (1.88 m)   Wt 100 kg   SpO2 94%   BMI 28.31 kg/m    General: Looks comfortable. Cardiovascular: S1-S2 normal.  Regular rate rhythm. Respiratory: Bilateral clear.  No added sounds. Gastrointestinal: Soft.  Nontender bowel sound present. Ext: No swelling or edema. Neuro: Intact. Musculoskeletal: No deformities.      Data Reviewed:  Family Communication: Ave Filter on the phone.    Disposition: Status is: Inpatient Stable to transfer to rehab.      Total time spent: 35 minutes  Author: Dorcas Carrow, MD 02/17/2023 11:35 AM  For on call review [email or web address].

## 2023-02-17 NOTE — Care Management Important Message (Signed)
Important Message  Patient Details  Name: Shane Holloway MRN: [id number] Date of Birth: 1951/03/21   Medicare Important Message Given:  Yes     Jasen Hartstein 02/17/2023, 3:00 PM

## 2023-02-17 NOTE — Progress Notes (Signed)
Physical Therapy Treatment Patient Details Name: Shane Holloway MRN: [id number] DOB: [birth year] Today's Date: 02/17/2023   History of Present Illness 72 y.o. male presented to [facility] ED on 9/4 with dizziness. Found to have multiple strokes in multiple different vascular territories. PMHx: recent CVA, HTN, HLD, chronic systolic/diastolic congestive heart failure, CKD stage IV, CAD s/p CABG, DM2, history of SVT, history of DVT on Eliquis, history of CVA, history of aortic valve repair, vascular dementia.    PT Comments  Treatment limited by symptomatic orthostatic hypotension that occurred during follow-up today. Supine BP 108/79 HR 98; seated BP 65/55 HR 110; Unable to tolerate sitting longer than 1 minute, feeling faint and lightheaded. Pt assisted back to bed. Agreeable to bed level exercises and encouraged regular use of HEP during admission to limited muscle atrophy and secondary complications associated with immobility. He tolerated exercises well but needs cues to initiate intermittently, inconsistently following single step commands. He is able to actively dorsiflex Rt ankle today; I would limited use of AFO for now unless functionally dorsiflexion deficit impairs his safety with gait; prefer pt to work on restoring normal use and motor patterning with gait during rehab. RN notified of hypotension. Will continue to follow and progress as tolerated.     If plan is discharge home, recommend the following: Assistance with cooking/housework;Direct supervision/assist for medications management;Direct supervision/assist for financial management;Assist for transportation;Help with stairs or ramp for entrance;A little help with walking and/or transfers;A little help with bathing/dressing/bathroom   Can travel by private vehicle     Yes  Equipment Recommendations  None recommended by PT    Recommendations for Other Services       Precautions / Restrictions Precautions Precautions:  Fall Precaution Comments: orthostatic hypotension; BP goal <220/110 Restrictions Weight Bearing Restrictions: No     Mobility  Bed Mobility Overal bed mobility: Needs Assistance Bed Mobility: Supine to Sit, Sit to Supine     Supine to sit: Used rails, Contact guard Sit to supine: Contact guard assist   General bed mobility comments: Able to bring LEs out of and back into bed today with CGA. Cues to facilitate.    Transfers                   General transfer comment: Deferred due to symptomatic orthostatic hypotension    Ambulation/Gait                   Stairs             Wheelchair Mobility     Tilt Bed    Modified Rankin (Stroke Patients Only) Modified Rankin (Stroke Patients Only) Pre-Morbid Rankin Score: Slight disability Modified Rankin: Moderately severe disability     Balance Overall balance assessment: Needs assistance Sitting-balance support: No upper extremity supported, Feet supported Sitting balance-Leahy Scale: Fair                                      Cognition Arousal: Alert Behavior During Therapy: WFL for tasks assessed/performed Overall Cognitive Status: No family/caregiver present to determine baseline cognitive functioning                                 General Comments: following one step commands inconsistently- a little dealy in processing        Exercises General Exercises -  Lower Extremity Ankle Circles/Pumps: AROM, Both, 10 reps, Supine Quad Sets: Strengthening, Both, 10 reps, Supine Gluteal Sets: Strengthening, Both, 10 reps, Supine Heel Slides: Strengthening, Both, 10 reps, Supine Hip ABduction/ADduction: Strengthening, Both, 10 reps, Supine Straight Leg Raises: Strengthening, Both, 10 reps, Supine    General Comments General comments (skin integrity, edema, etc.): Supine BP 108/79 HR 98; Seaetd EOB BP 65/55 HR 110 (symptomatic with lightheadedness) After returning to  supine BP 114/86 HR102      Pertinent Vitals/Pain Pain Assessment Pain Assessment: No/denies pain Pain Intervention(s): Monitored during session    Home Living                          Prior Function            PT Goals (current goals can now be found in the care plan section) Acute Rehab PT Goals Patient Stated Goal: Get well PT Goal Formulation: With patient Time For Goal Achievement: 02/27/23 Potential to Achieve Goals: Good Progress towards PT goals: Progressing toward goals    Frequency    Min 1X/week      PT Plan      Co-evaluation              AM-PAC PT "6 Clicks" Mobility   Outcome Measure  Help needed turning from your back to your side while in a flat bed without using bedrails?: A Little Help needed moving from lying on your back to sitting on the side of a flat bed without using bedrails?: A Little Help needed moving to and from a bed to a chair (including a wheelchair)?: A Little Help needed standing up from a chair using your arms (e.g., wheelchair or bedside chair)?: A Little Help needed to walk in hospital room?: A Lot Help needed climbing 3-5 steps with a railing? : Total 6 Click Score: 15    End of Session   Activity Tolerance: Treatment limited secondary to medical complications (Comment);Other (comment) (orthostatic hypotension) Patient left: with call bell/phone within reach;in bed;with bed alarm set;with nursing/sitter in room;with SCD's reapplied Nurse Communication: Mobility status;Other (comment) (symptomatic orthostatic hypotension) PT Visit Diagnosis: Hemiplegia and hemiparesis;History of falling (Z91.81);Difficulty in walking, not elsewhere classified (R26.2);Dizziness and giddiness (R42);Other symptoms and signs involving the nervous system (R29.898);Other abnormalities of gait and mobility (R26.89) Hemiplegia - Right/Left: Right Hemiplegia - caused by: Cerebral infarction Pain - part of body:  (neck)     Time:  1610-9604 PT Time Calculation (min) (ACUTE ONLY): 16 min  Charges:    $Therapeutic Exercise: 8-22 mins PT General Charges $$ ACUTE PT VISIT: 1 Visit                     Kathlyn Sacramento, PT, DPT [facility]  Rehabilitation Services Physical Therapist Office: [phone] Website: [email or web address]    Berton Mount 02/17/2023, 1:31 PM

## 2023-02-18 ENCOUNTER — Encounter (HOSPITAL_COMMUNITY): Payer: Self-pay | Admitting: Physical Medicine & Rehabilitation

## 2023-02-18 ENCOUNTER — Other Ambulatory Visit: Payer: Self-pay

## 2023-02-18 ENCOUNTER — Inpatient Hospital Stay (HOSPITAL_COMMUNITY)
Admission: RE | Admit: 2023-02-18 | Discharge: 2023-02-25 | DRG: 057 | Disposition: A | Discharge status: Home Health | Payer: Medicare HMO | Source: Intra-hospital | Attending: Physical Medicine & Rehabilitation | Admitting: Physical Medicine & Rehabilitation

## 2023-02-18 DIAGNOSIS — N184 Chronic kidney disease, stage 4 (severe): Secondary | ICD-10-CM | POA: Diagnosis present

## 2023-02-18 DIAGNOSIS — Z23 Encounter for immunization: Secondary | ICD-10-CM

## 2023-02-18 DIAGNOSIS — Z951 Presence of aortocoronary bypass graft: Secondary | ICD-10-CM

## 2023-02-18 DIAGNOSIS — E1165 Type 2 diabetes mellitus with hyperglycemia: Secondary | ICD-10-CM | POA: Diagnosis not present

## 2023-02-18 DIAGNOSIS — D696 Thrombocytopenia, unspecified: Secondary | ICD-10-CM | POA: Diagnosis present

## 2023-02-18 DIAGNOSIS — Z9841 Cataract extraction status, right eye: Secondary | ICD-10-CM

## 2023-02-18 DIAGNOSIS — Z7985 Long-term (current) use of injectable non-insulin antidiabetic drugs: Secondary | ICD-10-CM

## 2023-02-18 DIAGNOSIS — I5042 Chronic combined systolic (congestive) and diastolic (congestive) heart failure: Secondary | ICD-10-CM | POA: Diagnosis present

## 2023-02-18 DIAGNOSIS — F0150 Vascular dementia without behavioral disturbance: Secondary | ICD-10-CM | POA: Diagnosis present

## 2023-02-18 DIAGNOSIS — I63532 Cerebral infarction due to unspecified occlusion or stenosis of left posterior cerebral artery: Secondary | ICD-10-CM | POA: Diagnosis present

## 2023-02-18 DIAGNOSIS — I130 Hypertensive heart and chronic kidney disease with heart failure and stage 1 through stage 4 chronic kidney disease, or unspecified chronic kidney disease: Secondary | ICD-10-CM | POA: Diagnosis present

## 2023-02-18 DIAGNOSIS — Z79899 Other long term (current) drug therapy: Secondary | ICD-10-CM

## 2023-02-18 DIAGNOSIS — K56609 Unspecified intestinal obstruction, unspecified as to partial versus complete obstruction: Secondary | ICD-10-CM | POA: Diagnosis not present

## 2023-02-18 DIAGNOSIS — Z952 Presence of prosthetic heart valve: Secondary | ICD-10-CM

## 2023-02-18 DIAGNOSIS — Z961 Presence of intraocular lens: Secondary | ICD-10-CM | POA: Diagnosis present

## 2023-02-18 DIAGNOSIS — Z955 Presence of coronary angioplasty implant and graft: Secondary | ICD-10-CM

## 2023-02-18 DIAGNOSIS — Z87891 Personal history of nicotine dependence: Secondary | ICD-10-CM

## 2023-02-18 DIAGNOSIS — N390 Urinary tract infection, site not specified: Secondary | ICD-10-CM | POA: Diagnosis not present

## 2023-02-18 DIAGNOSIS — I2510 Atherosclerotic heart disease of native coronary artery without angina pectoris: Secondary | ICD-10-CM | POA: Diagnosis present

## 2023-02-18 DIAGNOSIS — I252 Old myocardial infarction: Secondary | ICD-10-CM

## 2023-02-18 DIAGNOSIS — E860 Dehydration: Secondary | ICD-10-CM | POA: Diagnosis present

## 2023-02-18 DIAGNOSIS — E785 Hyperlipidemia, unspecified: Secondary | ICD-10-CM | POA: Diagnosis present

## 2023-02-18 DIAGNOSIS — Z8616 Personal history of COVID-19: Secondary | ICD-10-CM

## 2023-02-18 DIAGNOSIS — I69344 Monoplegia of lower limb following cerebral infarction affecting left non-dominant side: Principal | ICD-10-CM

## 2023-02-18 DIAGNOSIS — N400 Enlarged prostate without lower urinary tract symptoms: Secondary | ICD-10-CM | POA: Diagnosis present

## 2023-02-18 DIAGNOSIS — Z91013 Allergy to seafood: Secondary | ICD-10-CM

## 2023-02-18 DIAGNOSIS — Z8249 Family history of ischemic heart disease and other diseases of the circulatory system: Secondary | ICD-10-CM

## 2023-02-18 DIAGNOSIS — I951 Orthostatic hypotension: Secondary | ICD-10-CM | POA: Diagnosis present

## 2023-02-18 DIAGNOSIS — R059 Cough, unspecified: Secondary | ICD-10-CM | POA: Diagnosis not present

## 2023-02-18 DIAGNOSIS — Z7901 Long term (current) use of anticoagulants: Secondary | ICD-10-CM

## 2023-02-18 DIAGNOSIS — B952 Enterococcus as the cause of diseases classified elsewhere: Secondary | ICD-10-CM | POA: Diagnosis not present

## 2023-02-18 DIAGNOSIS — Z7982 Long term (current) use of aspirin: Secondary | ICD-10-CM

## 2023-02-18 DIAGNOSIS — Z794 Long term (current) use of insulin: Secondary | ICD-10-CM | POA: Diagnosis not present

## 2023-02-18 DIAGNOSIS — Z9842 Cataract extraction status, left eye: Secondary | ICD-10-CM

## 2023-02-18 DIAGNOSIS — I471 Supraventricular tachycardia, unspecified: Secondary | ICD-10-CM | POA: Diagnosis present

## 2023-02-18 DIAGNOSIS — I69393 Ataxia following cerebral infarction: Secondary | ICD-10-CM | POA: Diagnosis not present

## 2023-02-18 DIAGNOSIS — E1122 Type 2 diabetes mellitus with diabetic chronic kidney disease: Secondary | ICD-10-CM | POA: Diagnosis present

## 2023-02-18 DIAGNOSIS — M21371 Foot drop, right foot: Secondary | ICD-10-CM | POA: Diagnosis present

## 2023-02-18 DIAGNOSIS — E8809 Other disorders of plasma-protein metabolism, not elsewhere classified: Secondary | ICD-10-CM | POA: Diagnosis present

## 2023-02-18 DIAGNOSIS — Z7984 Long term (current) use of oral hypoglycemic drugs: Secondary | ICD-10-CM

## 2023-02-18 DIAGNOSIS — Z86718 Personal history of other venous thrombosis and embolism: Secondary | ICD-10-CM

## 2023-02-18 LAB — BASIC METABOLIC PANEL
Anion gap: 8 (ref 5–15)
BUN: 37 mg/dL — ABNORMAL HIGH (ref 8–23)
CO2: 23 mmol/L (ref 22–32)
Calcium: 8.4 mg/dL — ABNORMAL LOW (ref 8.9–10.3)
Chloride: 106 mmol/L (ref 98–111)
Creatinine, Ser: 2.97 mg/dL — ABNORMAL HIGH (ref 0.61–1.24)
GFR, Estimated: 21 mL/min — ABNORMAL LOW (ref >60)
Glucose, Bld: 150 mg/dL — ABNORMAL HIGH (ref 70–99)
Potassium: 3.7 mmol/L (ref 3.5–5.1)
Sodium: 137 mmol/L (ref 135–145)

## 2023-02-18 LAB — CBC
HCT: 37.6 % — ABNORMAL LOW (ref 39.0–52.0)
Hemoglobin: 12.3 g/dL — ABNORMAL LOW (ref 13.0–17.0)
MCH: 28.2 pg (ref 26.0–34.0)
MCHC: 32.7 g/dL (ref 30.0–36.0)
MCV: 86.2 fL (ref 80.0–100.0)
Platelets: 119 10*3/uL — ABNORMAL LOW (ref 150–400)
RBC: 4.36 MIL/uL (ref 4.22–5.81)
RDW: 13.6 % (ref 11.5–15.5)
WBC: 6.4 10*3/uL (ref 4.0–10.5)
nRBC: 0 % (ref 0.0–0.2)

## 2023-02-18 LAB — GLUCOSE, CAPILLARY
Glucose-Capillary: 128 mg/dL — ABNORMAL HIGH (ref 70–99)
Glucose-Capillary: 166 mg/dL — ABNORMAL HIGH (ref 70–99)
Glucose-Capillary: 140 mg/dL — ABNORMAL HIGH (ref 70–99)
Glucose-Capillary: 177 mg/dL — ABNORMAL HIGH (ref 70–99)

## 2023-02-18 MED ORDER — ONDANSETRON HCL 4 MG/2ML IJ SOLN: 4.0000 mg | Freq: Four times a day (QID) | INTRAMUSCULAR | Status: DC | PRN

## 2023-02-18 MED ORDER — INFLUENZA VAC A&B SURF ANT ADJ 0.5 ML IM SUSY
0.5000 mL | PREFILLED_SYRINGE | INTRAMUSCULAR | Status: AC
  Administered 2023-02-19: 0.5 mL via INTRAMUSCULAR
  Filled 2023-02-18: qty 0.5

## 2023-02-18 MED ORDER — FINASTERIDE 5 MG PO TABS
5.0000 mg | ORAL_TABLET | Freq: Every day | ORAL | Status: DC
  Administered 2023-02-19 – 2023-02-25 (×7): 5 mg via ORAL
  Filled 2023-02-18 (×7): qty 1

## 2023-02-18 MED ORDER — EMPAGLIFLOZIN 10 MG PO TABS
10.0000 mg | ORAL_TABLET | Freq: Every day | ORAL | Status: DC
  Administered 2023-02-19 – 2023-02-25 (×7): 10 mg via ORAL
  Filled 2023-02-18 (×7): qty 1

## 2023-02-18 MED ORDER — APIXABAN 5 MG PO TABS: 5.0000 mg | ORAL_TABLET | Freq: Two times a day (BID) | ORAL | Status: AC

## 2023-02-18 MED ORDER — SORBITOL 70 % SOLN
30.0000 mL | Freq: Every day | Status: DC | PRN
  Administered 2023-02-21 (×2): 30 mL via ORAL
  Filled 2023-02-18 (×2): qty 30

## 2023-02-18 MED ORDER — ATORVASTATIN CALCIUM 80 MG PO TABS
80.0000 mg | ORAL_TABLET | Freq: Every day | ORAL | Status: DC
  Administered 2023-02-19 – 2023-02-25 (×7): 80 mg via ORAL
  Filled 2023-02-18 (×7): qty 1

## 2023-02-18 MED ORDER — POLYETHYLENE GLYCOL 3350 17 G PO PACK: 17.0000 g | PACK | Freq: Every day | ORAL | Status: DC | PRN

## 2023-02-18 MED ORDER — AMIODARONE HCL 200 MG PO TABS
200.0000 mg | ORAL_TABLET | Freq: Every day | ORAL | Status: DC
  Administered 2023-02-19 – 2023-02-25 (×7): 200 mg via ORAL
  Filled 2023-02-18 (×7): qty 1

## 2023-02-18 MED ORDER — INSULIN ASPART 100 UNIT/ML IJ SOLN
0.0000 [IU] | Freq: Three times a day (TID) | INTRAMUSCULAR | Status: DC
  Administered 2023-02-18: 2 [IU] via SUBCUTANEOUS
  Administered 2023-02-19 (×2): 3 [IU] via SUBCUTANEOUS
  Administered 2023-02-19 – 2023-02-20 (×2): 2 [IU] via SUBCUTANEOUS
  Administered 2023-02-20: 3 [IU] via SUBCUTANEOUS
  Administered 2023-02-20: 2 [IU] via SUBCUTANEOUS
  Administered 2023-02-21 (×3): 3 [IU] via SUBCUTANEOUS
  Administered 2023-02-22 (×2): 2 [IU] via SUBCUTANEOUS
  Administered 2023-02-22: 1 [IU] via SUBCUTANEOUS
  Administered 2023-02-23: 2 [IU] via SUBCUTANEOUS
  Administered 2023-02-23 – 2023-02-24 (×3): 3 [IU] via SUBCUTANEOUS
  Administered 2023-02-24 – 2023-02-25 (×3): 2 [IU] via SUBCUTANEOUS

## 2023-02-18 MED ORDER — APIXABAN 5 MG PO TABS
5.0000 mg | ORAL_TABLET | Freq: Two times a day (BID) | ORAL | Status: DC
  Administered 2023-02-18 – 2023-02-25 (×14): 5 mg via ORAL
  Filled 2023-02-18 (×14): qty 1

## 2023-02-18 MED ORDER — EZETIMIBE 10 MG PO TABS
10.0000 mg | ORAL_TABLET | Freq: Every day | ORAL | Status: DC
  Administered 2023-02-19 – 2023-02-25 (×7): 10 mg via ORAL
  Filled 2023-02-18 (×7): qty 1

## 2023-02-18 MED ORDER — ALUM & MAG HYDROXIDE-SIMETH 200-200-20 MG/5ML PO SUSP: 30.0000 mL | ORAL | Status: DC | PRN

## 2023-02-18 MED ORDER — INSULIN ASPART 100 UNIT/ML IJ SOLN
0.0000 [IU] | Freq: Every day | INTRAMUSCULAR | Status: DC
  Administered 2023-02-19: 2 [IU] via SUBCUTANEOUS
  Administered 2023-02-20: 3 [IU] via SUBCUTANEOUS
  Administered 2023-02-23: 4 [IU] via SUBCUTANEOUS

## 2023-02-18 MED ORDER — METOPROLOL SUCCINATE ER 50 MG PO TB24
50.0000 mg | ORAL_TABLET | Freq: Every day | ORAL | Status: DC
  Administered 2023-02-19 – 2023-02-25 (×7): 50 mg via ORAL
  Filled 2023-02-18 (×7): qty 1

## 2023-02-18 MED ORDER — MELATONIN 5 MG PO TABS
5.0000 mg | ORAL_TABLET | Freq: Every day | ORAL | Status: DC
  Administered 2023-02-18 – 2023-02-24 (×7): 5 mg via ORAL
  Filled 2023-02-18 (×7): qty 1

## 2023-02-18 MED ORDER — GUAIFENESIN-DM 100-10 MG/5ML PO SYRP: 10.0000 mL | ORAL_SOLUTION | Freq: Four times a day (QID) | ORAL | Status: DC | PRN

## 2023-02-18 MED ORDER — ONDANSETRON HCL 4 MG PO TABS
4.0000 mg | ORAL_TABLET | Freq: Four times a day (QID) | ORAL | Status: DC | PRN
  Administered 2023-02-20: 4 mg via ORAL
  Filled 2023-02-18: qty 1

## 2023-02-18 MED ORDER — ACETAMINOPHEN 325 MG PO TABS
325.0000 mg | ORAL_TABLET | ORAL | Status: DC | PRN
  Administered 2023-02-20 – 2023-02-24 (×2): 650 mg via ORAL
  Filled 2023-02-18 (×2): qty 2

## 2023-02-18 MED ORDER — FLEET ENEMA RE ENEM: 1.0000 | ENEMA | Freq: Once | RECTAL | Status: DC | PRN

## 2023-02-18 MED ORDER — METHOCARBAMOL 500 MG PO TABS
500.0000 mg | ORAL_TABLET | Freq: Four times a day (QID) | ORAL | Status: DC | PRN
  Filled 2023-02-18: qty 1

## 2023-02-18 MED ORDER — INSULIN DETEMIR 100 UNIT/ML ~~LOC~~ SOLN
8.0000 [IU] | Freq: Every day | SUBCUTANEOUS | Status: DC
  Administered 2023-02-18 – 2023-02-19 (×2): 8 [IU] via SUBCUTANEOUS
  Filled 2023-02-18 (×3): qty 0.08

## 2023-02-18 NOTE — Progress Notes (Signed)
PMR Admission Coordinator Pre-Admission Assessment   Patient: Shane Holloway is an 72 y.o., male MRN: [id number] DOB: 10-Oct-1950 Height: 6\' 2"  (188 cm) Weight: 100 kg   Insurance Information HMO: yes    PPO:      PCP:      IPA:      80/20:      OTHER:  PRIMARY: [employer]   Policy #: [id number], Medicare: [id number]  Subscriber: patient CM Name: Danella Penton      Phone#: [phone]     Fax#: [phone] Pre-Cert#: [id number]            Employer: [employer] called with approval for admit 9/10 for 9/10-9/16.  benefitsPhone: n/a - online at [email or web address]   Eff Date: 07/11/2022- still active   Deductible: does not have one OOP Max: $7,750 ($2,990 met)   CIR: $335/day copay with max copay of $2,010/admission (6 days) SNF: $0/day co-pay for days 1-20, $203/day co-pay for days 21-100, limited to 100 days/cal yr Outpatient:  $25 copay/visit   Home Health:  100% coverage   DME: 80% coverage; 20% co-insurance   Providers: in-network DOB 06/23/1950   SECONDARY:       Policy#:      Phone#:    Artist:       Phone#:    The "Data Collection Information Summary" for patients in Inpatient Rehabilitation Facilities with attached "Privacy Act Statement-Health Care Records" was provided and verbally reviewed with: Patient and Family   Emergency Contact Information Contact Information       Name Relation Home Work Mobile    Mingo Brother [phone]   [phone]         Other Contacts       Name Relation Home Work Mobile    Cano Martin Pena Niece [phone]               Current Medical History  Patient Admitting Diagnosis: SBO and CVA History of Present Illness: Pt is a 72 year old male with medical hx significant for: h/o DVT, h/o aortic valve repair, CKD, HTN, hyperlipidemia, CABG, h/o SVT, DM II, CAD, acute CVA (~2 weeks prior). Pt [facility] to [facility] on 02/11/22 d/t nausea, vomiting, and dizziness. MRI showed new  subcentimeter acute to early subacute ischemic infarcts involving contralateral right frontal lobe and right midbrain/pons. CT abdomen/pelvis revealed SBO. MRA head/neck was negative for LVO.  Pt. With CKD stage IV Baseline appears to be 2.5-3.4. Was 3.2 on admission, improved to 2.7 with fluids Developed significant orthostatic drop in blood pressure 9/9, was Treated with IV fluids.  Entresto on hold. Therapy evaluations completed and CIR recommended d/t pt's deficits in functional mobility. Complete NIHSS TOTAL: 1   Patient's medical record from [facility] has been reviewed by the rehabilitation admission coordinator and physician.   Past Medical History      Past Medical History:  Diagnosis Date   Acute on chronic combined systolic and diastolic CHF (congestive heart failure) (HCC) 11/24/2008    Qualifier: Diagnosis of   By: Johnnette Barrios, CNA, Sandy         Acute renal failure with acute tubular necrosis superimposed on stage 3b chronic kidney disease (HCC) 12/15/2021   Allergy     Aortic stenosis     Arthritis      hands   BPH (benign prostatic hyperplasia)     CHF (congestive heart failure) (HCC)     CKD (chronic kidney disease)     COVID-19  COVID-19 virus infection 12/16/2021   Diabetes mellitus     Dyspnea     DYSPNEA 11/24/2008    Qualifier: Diagnosis of   By: Johnnette Barrios, CNA, Sandy         Hyperlipidemia     Hypertension     Hypertensive urgency 12/10/2021   Myocardial infarction [facility]) 06/10/2002   NSTEMI (non-ST elevated myocardial infarction) (HCC) 12/10/2021   Stroke (HCC) 09/2020    Some memory deficits   Vertigo     Wears dentures      full upper          Has the patient had major surgery during 100 days prior to admission? No   Family History   family history includes Heart disease in his father and mother.   Current Medications  Current Medications    Current Facility-Administered Medications:    amiodarone (PACERONE) tablet 200 mg, 200 mg, Oral,  Daily, Danford, Earl Lites, MD, 200 mg at 02/17/23 0945   apixaban (ELIQUIS) tablet 5 mg, 5 mg, Oral, BID, Tomie China, MD, 5 mg at 02/17/23 0945   atorvastatin (LIPITOR) tablet 80 mg, 80 mg, Oral, Daily, Danford, Earl Lites, MD, 80 mg at 02/17/23 0945   empagliflozin (JARDIANCE) tablet 10 mg, 10 mg, Oral, Daily, Ghimire, Kuber, MD   ezetimibe (ZETIA) tablet 10 mg, 10 mg, Oral, Daily, Danford, Earl Lites, MD, 10 mg at 02/17/23 0945   finasteride (PROSCAR) tablet 5 mg, 5 mg, Oral, Daily, Danford, Earl Lites, MD, 5 mg at 02/17/23 0944   insulin aspart (novoLOG) injection 0-15 Units, 0-15 Units, Subcutaneous, TID WC, Danford, Earl Lites, MD, 5 Units at 02/17/23 1231   insulin aspart (novoLOG) injection 0-5 Units, 0-5 Units, Subcutaneous, QHS, Danford, Christopher P, MD   insulin detemir (LEVEMIR) injection 8 Units, 8 Units, Subcutaneous, QHS, Danford, Earl Lites, MD, 8 Units at 02/16/23 2146   iohexol (OMNIPAQUE) 350 MG/ML injection 50 mL, 50 mL, Intravenous, Once PRN, Charlynne Pander, MD   metoprolol succinate (TOPROL-XL) 24 hr tablet 50 mg, 50 mg, Oral, Daily, Danford, Earl Lites, MD, 50 mg at 02/17/23 0946   metoprolol tartrate (LOPRESSOR) injection 5 mg, 5 mg, Intravenous, Q6H PRN, Danford, Earl Lites, MD   ondansetron (ZOFRAN) injection 4 mg, 4 mg, Intravenous, Q6H PRN, Danford, Earl Lites, MD, 4 mg at 02/14/23 0003   Oral care mouth rinse, 15 mL, Mouth Rinse, PRN, Danford, Earl Lites, MD   promethazine (PHENERGAN) 12.5 mg in sodium chloride 0.9 % 50 mL IVPB, 12.5 mg, Intravenous, Q6H PRN, Danford, Earl Lites, MD     Patients Current Diet:  Diet Order                  DIET SOFT Room service appropriate? Yes; Fluid consistency: Thin  Diet effective now                         Precautions / Restrictions Precautions Precautions: Fall Precaution Comments: orthostatic hypotension; BP goal <220/110 Restrictions Weight Bearing Restrictions: No     Has the patient had 2 or more falls or a fall with injury in the past year? No   Prior Activity Level Limited Community (1-2x/wk): gets out of house ~1x/week, mainly medical appointments   Prior Functional Level Self Care: Did the patient need help bathing, dressing, using the toilet or eating? Independent   Indoor Mobility: Did the patient need assistance with walking from room to room (with or without device)? Independent   Stairs: Did  the patient need assistance with internal or external stairs (with or without device)? Independent   Functional Cognition: Did the patient need help planning regular tasks such as shopping or remembering to take medications? Needed some help   Patient Information Are you of Hispanic, Latino/a,or Spanish origin?: A. No, not of Hispanic, Latino/a, or Spanish origin What is your race?: A. White Do you need or want an interpreter to communicate with a doctor or health care staff?: 0. No   Patient's Response To:  Health Literacy and Transportation Is the patient able to respond to health literacy and transportation needs?: Yes Health Literacy - How often do you need to have someone help you when you read instructions, pamphlets, or other written material from your doctor or pharmacy?: Sometimes In the past 12 months, has lack of transportation kept you from medical appointments or from getting medications?: No In the past 12 months, has lack of transportation kept you from meetings, work, or from getting things needed for daily living?: No   Journalist, newspaper / Equipment Home Assistive Devices/Equipment: None Home Equipment: Agricultural consultant (2 wheels), [employer] - single point, Wheelchair - manual, Information systems manager   Prior Device Use: Indicate devices/aids used by the patient prior to current illness, exacerbation or injury? Walker and cane   Current Functional Level Cognition   Overall Cognitive Status: No family/caregiver present to determine baseline  cognitive functioning Orientation Level: Oriented to person, Oriented to place, Oriented to situation General Comments: following one step commands inconsistently- a little dealy in processing    Extremity Assessment (includes Sensation/Coordination)   Upper Extremity Assessment: Generalized weakness, RUE deficits/detail RUE Deficits / Details: Strength overall 4/5. However, dominant R UE strength decreased compared to L UE.  Lower Extremity Assessment: Defer to PT evaluation RLE Deficits / Details: Rt knee extension 4/5, Rt ankle DF 4/5 RLE:  (Unable to fully assess due to N&V)     ADLs   Overall ADL's : Needs assistance/impaired Eating/Feeding: Set up, Sitting Grooming: Set up, Sitting Upper Body Bathing: Minimal assistance, Cueing for safety, Cueing for sequencing, Sitting Upper Body Dressing : Minimal assistance, Sitting Toilet Transfer: Moderate assistance, Cueing for safety, Cueing for sequencing, Rolling walker (2 wheels) Toilet Transfer Details (indicate cue type and reason): simulated during transfer from eob with pivotal steps to the recliner Toileting- Clothing Manipulation and Hygiene: Maximal assistance, Sit to/from stand Toileting - Clothing Manipulation Details (indicate cue type and reason): rn present during session and assisted pt. clean up of buttocks and hip area Functional mobility during ADLs: Minimal assistance, Moderate assistance, Cueing for sequencing, Rolling walker (2 wheels), Cueing for safety General ADL Comments: cues for sequencing and hand placement prior to sitting down, increased time for pt. to follow the one step commands for hand placement     Mobility   Overal bed mobility: Needs Assistance Bed Mobility: Supine to Sit, Sit to Supine Supine to sit: Used rails, Contact guard Sit to supine: Contact guard assist General bed mobility comments: Able to bring LEs out of and back into bed today with CGA. Cues to facilitate.     Transfers   Overall  transfer level: Needs assistance Equipment used: Rolling walker (2 wheels) Transfers: Sit to/from Stand, Bed to chair/wheelchair/BSC Sit to Stand: Mod assist Bed to/from chair/wheelchair/BSC transfer type:: Step pivot Step pivot transfers: Mod assist General transfer comment: Deferred due to symptomatic orthostatic hypotension     Ambulation / Gait / Stairs / Wheelchair Mobility   Ambulation/Gait Ambulation/Gait assistance: Min assist  Gait Distance (Feet): 35 Feet (x2 trials with seated break in bathroom) Assistive device: Rolling walker (2 wheels) Gait Pattern/deviations: Decreased stance time - right, Decreased dorsiflexion - right, Decreased stride length, Step-through pattern, Ataxic, Shuffle, Trunk flexed, Wide base of support General Gait Details: Min assist for balance and RW control with turns. Intermittent cues for walker placement/proximity to device and upright stance. RLE foot drop with slightly improved step length/height with constant cues for sequencing/posture. Pt may benefit from RLE AFO pending progress next session; pt able to somewhat DF RLE while seated but poor DF ROM against gravity during gait trial. Gait velocity: decr Gait velocity interpretation: <1.31 ft/sec, indicative of household ambulator Pre-gait activities: weight shift and march in place with RW support     Posture / Balance Balance Overall balance assessment: Needs assistance Sitting-balance support: No upper extremity supported, Feet supported Sitting balance-Leahy Scale: Fair Standing balance support: Bilateral upper extremity supported, During functional activity, Reliant on assistive device for balance Standing balance-Leahy Scale: Poor Standing balance comment: RW reliant, CGA for static standing due to dizziness     Special needs/care consideration Continuous Drip IV  lactated ringers 1000 mL with potassium chloride infusion and Diabetic management Novolog 0-9 units every 4 hours     Previous Home Environment (from acute therapy documentation) Living Arrangements: Alone  Lives With: Alone Available Help at Discharge: Family, Friend(s), Available 24 hours/day Type of Home: Mobile home Home Layout: One level Home Access: Ramped entrance Bathroom Shower/Tub: Health visitor: Standard Bathroom Accessibility: Yes How Accessible: Accessible via walker Home Care Services: No Additional Comments: States he went to rehab after d/c from hosptial last month and recently returned home with HHPT. Has been ambulating without assistive device and is independent with ADLs.   Discharge Living Setting Plans for Discharge Living Setting: Patient's home Type of Home at Discharge: Mobile home Discharge Home Layout: One level Discharge Home Access: Ramped entrance Discharge Bathroom Shower/Tub: Walk-in shower Discharge Bathroom Toilet: Standard Discharge Bathroom Accessibility: Yes How Accessible: Accessible via walker Does the patient have any problems obtaining your medications?: No   Social/Family/Support Systems Anticipated Caregiver: Victoriano Lain (niece), [name] (friend), [name] (nephew) Anticipated Caregiver's Contact Information: [name]: [phone]: [phone] Caregiver Availability: 24/7 Discharge Plan Discussed with Primary Caregiver: Yes Is Caregiver In Agreement with Plan?: Yes Does Caregiver/Family have Issues with Lodging/Transportation while Pt is in Rehab?: No   Goals Patient/Family Goal for Rehab: Supervision: PT/OT Expected length of stay: 7-10 days Pt/Family Agrees to Admission and willing to participate: Yes Program Orientation Provided & Reviewed with Pt/Caregiver Including Roles  & Responsibilities: Yes   Decrease burden of Care through IP rehab admission: NA   Possible need for SNF placement upon discharge: Not anticipated   Patient Condition: I have reviewed medical records from [facility], spoken with CM, and  patient and family member. I met with patient at the bedside and discussed via phone for inpatient rehabilitation assessment.  Patient will benefit from ongoing PT and OT, can actively participate in 3 hours of therapy a day 5 days of the week, and can make measurable gains during the admission.  Patient will also benefit from the coordinated team approach during an Inpatient Acute Rehabilitation admission.  The patient will receive intensive therapy as well as Rehabilitation physician, nursing, social worker, and care management interventions.  Due to safety, disease management, medication administration, pain management, and patient education the patient requires 24 hour a day rehabilitation nursing.  The patient is currently  min-mod A with mobility and basic ADLs.  Discharge setting and therapy post discharge at home with home health is anticipated.  Patient has agreed to participate in the Acute Inpatient Rehabilitation Program and will admit today.   Preadmission Screen Completed By:  Domingo Pulse, 02/17/2023 4:10 PM ______________________________________________________________________   Discussed status with Dr. Berline Chough  on 02/18/23 at 1012 and received approval for admission today.   Admission Coordinator:  Domingo Pulse, CCC-SLP, time 1012/Date 02/18/23    Assessment/Plan: Diagnosis: embolic strokes- L posterior circulation and R frontal lobe Does the need for close, 24 hr/day Medical supervision in concert with the patient's rehab needs make it unreasonable for this patient to be served in a less intensive setting? Yes Co-Morbidities requiring supervision/potential complications: CHF, CKD 4, CAD/CABG, AV repair, DVT was on Eliquis; DM; SVT; recent SBO Due to bladder management, bowel management, safety, skin/wound care, disease management, medication administration, pain management, and patient education, does the patient require 24 hr/day rehab nursing? Yes Does the patient  require coordinated care of a physician, rehab nurse, PT, OT, and SLP to address physical and functional deficits in the context of the above medical diagnosis(es)? Yes Addressing deficits in the following areas: balance, endurance, locomotion, strength, transferring, bowel/bladder control, bathing, dressing, feeding, grooming, toileting, and cognition Can the patient actively participate in an intensive therapy program of at least 3 hrs of therapy 5 days a week? Yes The potential for patient to make measurable gains while on inpatient rehab is good Anticipated functional outcomes upon discharge from inpatient rehab: supervision PT, supervision OT, n/a SLP Estimated rehab length of stay to reach the above functional goals is: 7-10 days Anticipated discharge destination: Home 10. Overall Rehab/Functional Prognosis: good     MD Signature:

## 2023-02-18 NOTE — Plan of Care (Signed)
Problem: Education: Goal: Knowledge of disease or condition will improve Outcome: Adequate for Discharge Goal: Knowledge of secondary prevention will improve (MUST DOCUMENT ALL) Outcome: Adequate for Discharge Goal: Knowledge of patient specific risk factors will improve Elta [place] N/A or DELETE if not current risk factor) Outcome: Adequate for Discharge   Problem: Ischemic Stroke/TIA Tissue Perfusion: Goal: Complications of ischemic stroke/TIA will be minimized Outcome: Adequate for Discharge   Problem: Coping: Goal: Will verbalize positive feelings about self Outcome: Adequate for Discharge Goal: Will identify appropriate support needs Outcome: Adequate for Discharge   Problem: Health Behavior/Discharge Planning: Goal: Ability to manage health-related needs will improve Outcome: Adequate for Discharge Goal: Goals will be collaboratively established with patient/family Outcome: Adequate for Discharge   Problem: Self-Care: Goal: Ability to participate in self-care as condition permits will improve Outcome: Adequate for Discharge Goal: Verbalization of feelings and concerns over difficulty with self-care will improve Outcome: Adequate for Discharge Goal: Ability to communicate needs accurately will improve Outcome: Adequate for Discharge   Problem: Nutrition: Goal: Risk of aspiration will decrease Outcome: Adequate for Discharge Goal: Dietary intake will improve Outcome: Adequate for Discharge   Problem: Education: Goal: Knowledge of General Education information will improve Description: Including pain rating scale, medication(s)/side effects and non-pharmacologic comfort measures Outcome: Adequate for Discharge   Problem: Health Behavior/Discharge Planning: Goal: Ability to manage health-related needs will improve Outcome: Adequate for Discharge   Problem: Clinical Measurements: Goal: Ability to maintain clinical measurements within normal limits will improve Outcome:  Adequate for Discharge Goal: Will remain free from infection Outcome: Adequate for Discharge Goal: Diagnostic test results will improve Outcome: Adequate for Discharge Goal: Respiratory complications will improve Outcome: Adequate for Discharge Goal: Cardiovascular complication will be avoided Outcome: Adequate for Discharge   Problem: Activity: Goal: Risk for activity intolerance will decrease Outcome: Adequate for Discharge   Problem: Nutrition: Goal: Adequate nutrition will be maintained Outcome: Adequate for Discharge   Problem: Coping: Goal: Level of anxiety will decrease Outcome: Adequate for Discharge   Problem: Elimination: Goal: Will not experience complications related to bowel motility Outcome: Adequate for Discharge Goal: Will not experience complications related to urinary retention Outcome: Adequate for Discharge   Problem: Pain Managment: Goal: General experience of comfort will improve Outcome: Adequate for Discharge   Problem: Safety: Goal: Ability to remain free from injury will improve Outcome: Adequate for Discharge   Problem: Skin Integrity: Goal: Risk for impaired skin integrity will decrease Outcome: Adequate for Discharge   Problem: Education: Goal: Ability to describe self-care measures that may prevent or decrease complications (Diabetes Survival Skills Education) will improve Outcome: Adequate for Discharge Goal: Individualized Educational Video(s) Outcome: Adequate for Discharge   Problem: Coping: Goal: Ability to adjust to condition or change in health will improve Outcome: Adequate for Discharge   Problem: Fluid Volume: Goal: Ability to maintain a balanced intake and output will improve Outcome: Adequate for Discharge   Problem: Health Behavior/Discharge Planning: Goal: Ability to identify and utilize available resources and services will improve Outcome: Adequate for Discharge Goal: Ability to manage health-related needs will  improve Outcome: Adequate for Discharge   Problem: Metabolic: Goal: Ability to maintain appropriate glucose levels will improve Outcome: Adequate for Discharge   Problem: Nutritional: Goal: Maintenance of adequate nutrition will improve Outcome: Adequate for Discharge Goal: Progress toward achieving an optimal weight will improve Outcome: Adequate for Discharge   Problem: Skin Integrity: Goal: Risk for impaired skin integrity will decrease Outcome: Adequate for Discharge   Problem: Tissue Perfusion: Goal: Adequacy  of tissue perfusion will improve Outcome: Adequate for Discharge

## 2023-02-18 NOTE — Progress Notes (Signed)
Physical Therapy Treatment Patient Details Name: Shane Holloway MRN: [id number] DOB: 1950-08-11 Today's Date: 02/18/2023   History of Present Illness 72 y.o. male presented to [facility] ED on 9/4 with dizziness. Found to have multiple strokes in multiple different vascular territories. PMHx: recent CVA, HTN, HLD, chronic systolic/diastolic congestive heart failure, CKD stage IV, CAD s/p CABG, DM2, history of SVT, history of DVT on Eliquis, history of CVA, history of aortic valve repair, vascular dementia.    PT Comments  Pt seen for PT tx with pt agreeable to tx. Pt reports slight dizziness at end of gait but BP did not drop (see below). Pt is making steady progress with mobility, ambulating 1 lap around nurses station with RW & CGA with cuing for posture, gait pattern; pt still demonstrates decreased strength & control in distal RLE. Pt also fatigues quickly, requesting to lie down at end of session & reporting he has to rest before participating further. Will continue to follow pt acutely to progress mobility as able.    If plan is discharge home, recommend the following: Assistance with cooking/housework;Direct supervision/assist for medications management;Direct supervision/assist for financial management;Assist for transportation;Help with stairs or ramp for entrance;A little help with walking and/or transfers;A little help with bathing/dressing/bathroom   Can travel by private vehicle     Yes  Equipment Recommendations  None recommended by PT    Recommendations for Other Services       Precautions / Restrictions Precautions Precautions: Fall Precaution Comments: orthostatic hypotension; BP goal <220/110 Restrictions Weight Bearing Restrictions: No     Mobility  Bed Mobility Overal bed mobility: Modified Independent Bed Mobility: Supine to Sit, Sit to Supine     Supine to sit: Modified independent (Device/Increase time), HOB elevated, Used rails Sit to supine: HOB  elevated, Used rails, Modified independent (Device/Increase time)        Transfers Overall transfer level: Needs assistance Equipment used: Rolling walker (2 wheels), None Transfers: Sit to/from Stand Sit to Stand: Min assist, Contact guard assist           General transfer comment: STS without AD with min assist, STS with RW & cuing re: hand placement with CGA    Ambulation/Gait Ambulation/Gait assistance: Contact guard assist Gait Distance (Feet): 110 Feet Assistive device: Rolling walker (2 wheels) Gait Pattern/deviations: Decreased dorsiflexion - right, Decreased stride length, Trunk flexed Gait velocity: decrased     General Gait Details: decreased heel strike RLE, PT providing cuing for increased heel strike with pt attempting to return demo, PT provides cuing to ambulate within base of AD with upright posture but continues flexed posture.   Stairs             Wheelchair Mobility     Tilt Bed    Modified Rankin (Stroke Patients Only)       Balance Overall balance assessment: Needs assistance Sitting-balance support: No upper extremity supported, Feet supported Sitting balance-Leahy Scale: Fair     Standing balance support: Bilateral upper extremity supported, During functional activity, Reliant on assistive device for balance Standing balance-Leahy Scale: Fair                              Cognition Arousal: Alert   Overall Cognitive Status: No family/caregiver present to determine baseline cognitive functioning  General Comments: Pt follows simple commands inconsistently with extra time, slight delay in processing. Pt with poor recall/poor historian, reports he uses RW & SPC but not able to state which he uses most often or why, pt states "I have them close by me in case I need them". At end of the session pt requests to lie down reporting "you were me out" but when asked how fatigued he is  pt states "a little", unable to clarify.        Exercises Other Exercises Other Exercises: Pt performed 5x STS from EOB without BUE support with education re: anterior weight shifting & powering up to standing, focusing on eccentric control with lowering with good return demo from pt.    General Comments General comments (skin integrity, edema, etc.): BP checked in LUE sitting EOB at beginning of session: 133/93 mmHg MAP 106, sitting EOB after gait 160/91 mmHg MAP 112, pt reportes slight dizziness at end of gait but notes it goes away once seated      Pertinent Vitals/Pain Pain Assessment Pain Assessment: No/denies pain    Home Living                          Prior Function            PT Goals (current goals can now be found in the care plan section) Acute Rehab PT Goals Patient Stated Goal: Get well PT Goal Formulation: With patient Time For Goal Achievement: 02/27/23 Potential to Achieve Goals: Good Progress towards PT goals: Progressing toward goals    Frequency    Min 1X/week      PT Plan      Co-evaluation              AM-PAC PT "6 Clicks" Mobility   Outcome Measure  Help needed turning from your back to your side while in a flat bed without using bedrails?: None Help needed moving from lying on your back to sitting on the side of a flat bed without using bedrails?: None Help needed moving to and from a bed to a chair (including a wheelchair)?: A Little Help needed standing up from a chair using your arms (e.g., wheelchair or bedside chair)?: A Little Help needed to walk in hospital room?: A Little Help needed climbing 3-5 steps with a railing? : A Little 6 Click Score: 20    End of Session Equipment Utilized During Treatment: Gait belt Activity Tolerance: Patient tolerated treatment well;Patient limited by fatigue Patient left: in bed;with call bell/phone within reach;with bed alarm set   PT Visit Diagnosis: Muscle weakness  (generalized) (M62.81);Other abnormalities of gait and mobility (R26.89);Hemiplegia and hemiparesis Hemiplegia - Right/Left: Right Hemiplegia - caused by: Cerebral infarction     Time: 1610-9604 PT Time Calculation (min) (ACUTE ONLY): 14 min  Charges:    $Therapeutic Activity: 8-22 mins PT General Charges $$ ACUTE PT VISIT: 1 Visit                     Aleda Grana, PT, DPT 02/18/23, 12:44 PM   Sandi Mariscal 02/18/2023, 12:43 PM

## 2023-02-18 NOTE — Discharge Summary (Signed)
Physician Discharge Summary  Shane Holloway YQM:[id number] DOB: 01-07-1951 DOA: 02/12/2023  PCP: Kurtis Bushman, MD  Admit date: 02/12/2023 Discharge date: 02/18/2023  Admitted From: Home Disposition: Acute inpatient rehab  Recommendations for Outpatient Follow-up:  Follow up with PCP in 1-2 weeks after discharge Please obtain BMP/CBC in one week   Home Health: N/A Equipment/Devices: N/A  Discharge Condition: Stable CODE STATUS: Full code Diet recommendation: Low-salt diet, low-carb diet  Discharge summary: Shane Holloway is a 72 y.o. M with h/o CHF,  EF recovered to 55-60%, CKD IV baseline 2.8, CAD s/p CABG and AV repair, DM, hx SVT, hx DVT on Eliquis, and recent admission for stroke in the setting of holding Eliquis due to dental bleeding who presented with vomiting and abdominal pain.  In the ER, CT showed SBO.  Also found to have new embolic appearing strokes.  Treated conservatively.  Stroke made him more weaker.  Small bowel obstruction relieved.    SBO (small bowel obstruction) (HCC) Initial presenting complaint.  Admitted and had NG tube placed. General surgery consulted. Clinically improved.  Currently tolerating regular diet with normal bowel function.   Acute CVA (cerebrovascular accident) (HCC) MRI showed multiple strokes in different vascular territories, suspect central embolic source - Non-invasive angiography showed no clear source, no clinically significant carotid disease - Recent echo unremarkable, Neurology recommended not repeating - TCD and echo bubble recently done as well and were both negative - Lipids done recently, patient is on Lipitor 80 and Zetia - Aspirin not indicated, currently on chronic Apixaban - Evaluation for arrhythmia/atrial fibrillation: none on monitoring - PT recommended CIR _ Having a new stroke left him with more weakness and difficulty coordination.  Will benefit with acute inpatient rehab.   CKD (chronic kidney disease), stage  IV (HCC) Baseline appears to be 2.5-3.4.  Was 3.2 on admission, improved to 2.8 with fluids.  Will need close monitoring.   Orthostatic hypotension Developed significant orthostatic drop in blood pressure.  Treated with IV fluids.  Entresto and Flomax were discontinued.  Today he is systolic blood pressures were recorded, 30 point drop however systolic blood pressure on standing was 106/57 and patient did not have any orthostatic symptoms.   Continue orthostatic precautions.  Hopefully with improved mobility and rehab he will have better symptom control. Reevaluate for starting Entresto at the time of discharge from rehab.   Hypokalemia - Adequate.     DVT (deep vein thrombosis) - Continue home apixaban   Chronic HFrEF (heart failure with reduced ejection fraction) (HCC) - Continue metoprolol.  Hold Entresto. - Not on diuretic - Resume Jardiance.   Benign prostatic hyperplasia - Continue FLomax, finasteride.  Holding Flomax.  Continue finasteride.   CORONARY ATHEROSCLEROSIS, ARTERY BYPASS GRAFT - Continue Lipitor, Zetia, apixaban, metoprolol - Discontinue aspirin using Eliquis.   Essential hypertension BP fairly controlled and rather orthostatic hypotension.  Continue metoprolol. Holding Entresto   Uncontrolled type 2 diabetes mellitus with hyperglycemia, without long-term current use of insulin (HCC) Glucose controlled.   - Continue levemir - Continue SS correction insulin -  Jardiance    Patient is medically stabilizing.  Does have significant medical issues, difficulty mobility and required rehab before going home with family. Can be transferred to acute inpatient rehab for multidisciplinary rehab, blood pressure monitoring and treatment of orthostatics.   Discharge Diagnoses:  Principal Problem:   SBO (small bowel obstruction) (HCC) Active Problems:   Acute CVA (cerebrovascular accident) (HCC)   CKD (chronic kidney disease), stage IV (HCC)  Uncontrolled type 2  diabetes mellitus with hyperglycemia, without long-term current use of insulin (HCC)   Essential hypertension   CORONARY ATHEROSCLEROSIS, ARTERY BYPASS GRAFT   Benign prostatic hyperplasia   Vascular dementia without behavioral disturbance (HCC)   Chronic HFrEF (heart failure with reduced ejection fraction) (HCC)   History of DVT (deep vein thrombosis)   Hypokalemia   Orthostatic hypotension    Discharge Instructions  Discharge Instructions     Diet - low sodium heart healthy   Complete by: As directed    Diet Carb Modified   Complete by: As directed    Increase activity slowly   Complete by: As directed       Allergies as of 02/18/2023       Reactions   Altace [ramipril] Cough   Shellfish Allergy Nausea And Vomiting   Sulfa Antibiotics Hives        Medication List     STOP taking these medications    aspirin EC 81 MG tablet   Entresto 49-51 MG Generic drug: sacubitril-valsartan   Insulin NPH (Human) (Isophane) 100 UNIT/ML Kiwkpen Commonly known as: HUMULIN N   tamsulosin 0.4 MG Caps capsule Commonly known as: FLOMAX       TAKE these medications    amiodarone 200 MG tablet Commonly known as: PACERONE Take 1 tablet by mouth daily.   apixaban 5 MG Tabs tablet Commonly known as: ELIQUIS Take 1 tablet (5 mg total) by mouth 2 (two) times daily. What changed: See the new instructions.   atorvastatin 80 MG tablet Commonly known as: LIPITOR Take 80 mg by mouth daily.   empagliflozin 10 MG Tabs tablet Commonly known as: JARDIANCE Take 1 tablet by mouth daily.   ezetimibe 10 MG tablet Commonly known as: ZETIA Take 10 mg by mouth daily.   finasteride 5 MG tablet Commonly known as: PROSCAR Take 5 mg by mouth daily.   insulin detemir 100 UNIT/ML injection Commonly known as: LEVEMIR Inject 0.12 mLs (12 Units total) into the skin at bedtime.   metoprolol succinate 50 MG 24 hr tablet Commonly known as: TOPROL-XL Take 50 mg by mouth daily.    nitroGLYCERIN 0.4 MG SL tablet Commonly known as: NITROSTAT Place under the tongue.   Ozempic (1 MG/DOSE) 4 MG/3ML Sopn Generic drug: Semaglutide (1 MG/DOSE) Inject 1 mg into the skin once a week.        Allergies  Allergen Reactions   Altace [Ramipril] Cough   Shellfish Allergy Nausea And Vomiting   Sulfa Antibiotics Hives    Consultations: Neurology   Procedures/Studies: DG Abd Portable 1V-Small Bowel Obstruction Protocol-initial, 8 hr delay  Result Date: 02/13/2023 CLINICAL DATA:  Small bowel obstruction. EXAM: PORTABLE ABDOMEN - 1 VIEW COMPARISON:  CT 02/12/2023 FINDINGS: Tip and side port of the enteric tube below the diaphragm in the stomach. There is enteric contrast throughout the colon. The prominent loops of small bowel persist in the central abdomen. Vascular calcifications. IMPRESSION: 1. Administered enteric contrast is seen throughout the colon. 2. Persistent prominent loops of small bowel in the central abdomen, favor ileus in the setting. Electronically Signed   By: Narda Rutherford M.D.   On: 02/13/2023 22:58   MR ANGIO HEAD WO CONTRAST  Result Date: 02/13/2023 CLINICAL DATA:  Stroke, follow-up. EXAM: MRA NECK WITHOUT CONTRAST MRA HEAD WITHOUT CONTRAST TECHNIQUE: Angiographic images of the Circle of Willis were acquired using MRA technique without intravenous contrast. COMPARISON:  Head MRA 01/24/2023 FINDINGS: MRA NECK FINDINGS Aortic arch: Not included on  this noncontrast time-of-flight examination. Right carotid system: Patent without evidence of a significant stenosis or dissection. Left carotid system: Exclusion of the origin of the common carotid artery. The remainder of the common carotid artery and cervical ICA are patent. There is mild irregularity of the distal common carotid artery without evidence of a significant stenosis or dissection. Vertebral arteries: The left vertebral artery is patent and strongly dominant with suboptimal assessment of the proximal  V1 segment due to diminished signal from technical factors but no evidence of a significant stenosis or dissection more distally. The right vertebral artery is hypoplastic with nondiagnostic assessment of the proximal V1 segment but without evidence of a significant stenosis or dissection more distally. Antegrade flow bilaterally. Symmetric signal loss in both distal V3 segments which is likely artifactual. Other: None. MRA HEAD FINDINGS Anterior circulation: The internal carotid arteries are widely patent from skull base to carotid termini. ACAs and MCAs are patent without evidence of a proximal branch occlusion or significant proximal stenosis. The 2 mm anterior communicating region outpouching described on the prior MRA more clearly reflects a small infundibulum associated with the hypoplastic left A1 segment on today's study. No aneurysm is identified. Posterior circulation: The intracranial left vertebral artery is widely patent and strongly dominant. The distal right vertebral artery is again noted to be hypoplastic, largely terminating in PICA. Patent PICA and SCA origins are visualized bilaterally. The basilar artery is widely patent. Posterior communicating arteries are diminutive or absent. A severe distal right P2 stenosis and asymmetric attenuation of distal right PCA branch vessels are similar to the prior study. No aneurysm is identified. Anatomic variants: Hypoplastic left A1 segment. Hypoplastic right vertebral artery. IMPRESSION: 1. No large vessel occlusion. 2. Severe distal right P2 stenosis with attenuation of more distal right PCA branches. 3. Patent cervical carotid and vertebral arteries without evidence of a significant stenosis or dissection within above described study limitations (mainly affecting assessment of the proximal vertebral arteries). Electronically Signed   By: Sebastian Ache M.D.   On: 02/13/2023 13:00   MR ANGIO NECK WO CONTRAST  Result Date: 02/13/2023 CLINICAL DATA:   Stroke, follow-up. EXAM: MRA NECK WITHOUT CONTRAST MRA HEAD WITHOUT CONTRAST TECHNIQUE: Angiographic images of the Circle of Willis were acquired using MRA technique without intravenous contrast. COMPARISON:  Head MRA 01/24/2023 FINDINGS: MRA NECK FINDINGS Aortic arch: Not included on this noncontrast time-of-flight examination. Right carotid system: Patent without evidence of a significant stenosis or dissection. Left carotid system: Exclusion of the origin of the common carotid artery. The remainder of the common carotid artery and cervical ICA are patent. There is mild irregularity of the distal common carotid artery without evidence of a significant stenosis or dissection. Vertebral arteries: The left vertebral artery is patent and strongly dominant with suboptimal assessment of the proximal V1 segment due to diminished signal from technical factors but no evidence of a significant stenosis or dissection more distally. The right vertebral artery is hypoplastic with nondiagnostic assessment of the proximal V1 segment but without evidence of a significant stenosis or dissection more distally. Antegrade flow bilaterally. Symmetric signal loss in both distal V3 segments which is likely artifactual. Other: None. MRA HEAD FINDINGS Anterior circulation: The internal carotid arteries are widely patent from skull base to carotid termini. ACAs and MCAs are patent without evidence of a proximal branch occlusion or significant proximal stenosis. The 2 mm anterior communicating region outpouching described on the prior MRA more clearly reflects a small infundibulum associated with the hypoplastic left  A1 segment on today's study. No aneurysm is identified. Posterior circulation: The intracranial left vertebral artery is widely patent and strongly dominant. The distal right vertebral artery is again noted to be hypoplastic, largely terminating in PICA. Patent PICA and SCA origins are visualized bilaterally. The basilar artery  is widely patent. Posterior communicating arteries are diminutive or absent. A severe distal right P2 stenosis and asymmetric attenuation of distal right PCA branch vessels are similar to the prior study. No aneurysm is identified. Anatomic variants: Hypoplastic left A1 segment. Hypoplastic right vertebral artery. IMPRESSION: 1. No large vessel occlusion. 2. Severe distal right P2 stenosis with attenuation of more distal right PCA branches. 3. Patent cervical carotid and vertebral arteries without evidence of a significant stenosis or dissection within above described study limitations (mainly affecting assessment of the proximal vertebral arteries). Electronically Signed   By: Sebastian Ache M.D.   On: 02/13/2023 13:00   DG Abdomen 1 View  Result Date: 02/13/2023 CLINICAL DATA:  NG tube placement EXAM: ABDOMEN - 1 VIEW COMPARISON:  None Available. FINDINGS: NG tube in place with the tip in the fundus of the stomach. Prior CABG and valve replacement. No confluent airspace opacity. IMPRESSION: NG tube in the stomach. Electronically Signed   By: Charlett Nose M.D.   On: 02/13/2023 00:32   CT ABDOMEN PELVIS W CONTRAST  Result Date: 02/12/2023 CLINICAL DATA:  Abdominal pain. EXAM: CT ABDOMEN AND PELVIS WITH CONTRAST TECHNIQUE: Multidetector CT imaging of the abdomen and pelvis was performed using the standard protocol following bolus administration of intravenous contrast. RADIATION DOSE REDUCTION: This exam was performed according to the departmental dose-optimization program which includes automated exposure control, adjustment of the mA and/or kV according to patient size and/or use of iterative reconstruction technique. CONTRAST:  75mL OMNIPAQUE IOHEXOL 350 MG/ML SOLN COMPARISON:  None Available. FINDINGS: Lower chest: Moderate elevation noted right hemidiaphragm. Lung bases are clear of infiltrates. There are sternotomy sutures and CABG changes with heavy native CAD. No pericardial effusion. There is a metallic  aortic valve prosthesis. The aortic root is dilated at the sinuses of Valsalva where it measures 4.6 cm on 7:80. Surveillance imaging will be needed. The descending aorta is heavily calcified. There is a small hiatal hernia. Hepatobiliary: The liver is 18.3 cm in length and mildly steatotic. No mass enhancement is seen. Gallbladder surgically absent with dilatation of the common bile duct measuring 12 mm. There is no intrahepatic biliary dilatation or calcified ductal filling defect. Pancreas: No abnormality. Spleen: Mildly enlarged, 14.8 cm AP.  No mass. Adrenals/Urinary Tract: There is no adrenal mass. There is an irregular 3 mm nonobstructive caliceal stone in the lower pole of the left kidney, occasional few punctate nonobstructive caliceal stones on the right. There is no obstructing urinary stone or hydronephrosis. There is a 2 cm Bosniak 1 homogeneous cyst in the posterior left kidney, Hounsfield density is 0.7. There is a 1.2 cm Bosniak 1 cyst in the superior pole right kidney, Hounsfield density is -8.5. No follow-up imaging is recommended. There are additional scattered subcentimeter too small to characterize hypodensities in both kidneys consistent with Bosniak 2 cyst. No follow-up imaging is recommended. The bladder thickness is normal. Enlarged prostate impresses into the posterior bladder. Stomach/Bowel: Unremarkable stomach apart from a small hiatal hernia. Normal caliber duodenum and jejunum. There are dilated ileal small bowel segments in the right upper to mid abdomen. The mid/lower abdominal small bowel is collapsed. Maximum small bowel caliber is 3.6 cm. The exact transition point was  not found. Etiology could be enteritis, occult adhesions or occult internal hernia. The appendix is normal and well seen. There is mild fecal stasis. Scattered diverticula without evidence of diverticulitis. Vascular/Lymphatic: Heavy aortoiliac and visceral branch vessel atherosclerosis. No AAA. No lymphadenopathy  is seen. Reproductive: Enlarged prostate, measures 5.2 cm transverse. Bilateral pelvic phleboliths. Other: Small umbilical and bilateral inguinal fat hernias. No incarcerated hernia. No free hemorrhage, free fluid or free air. No bowel pneumatosis is seen. Musculoskeletal: Degenerative change lumbar spine. Degenerative disc disease thoracic spine. Mild osteopenia. No acute or other significant osseous findings. IMPRESSION: 1. Dilated ileal small bowel segments in the right upper to mid abdomen with collapsed mid/lower small bowel consistent with intermediate to high-grade SBO. The exact transition point was not found. Etiology could be enteritis, occult adhesions or occult internal hernia. 2. Constipation and diverticulosis. 3. Prostatomegaly. 4. Aortic atherosclerosis with dilated aortic root at the sinuses of Valsalva where it measures 4.6 cm. Surveillance imaging will be needed. Recommend semi-annual imaging followup by CTA or MRA and referral to cardiothoracic surgery if not already obtained. This recommendation follows 2010 ACCF/AHA/AATS/ACR/ ASA/SCA/SCAI/SIR/STS/SVM Guidelines for the Diagnosis and Management of Patients With Thoracic Aortic Disease. Circulation. 2010; 121: W098-J191. Aortic aneurysm NOS (ICD10-I71.9). 5. Mild hepatic steatosis. 6. Mild splenomegaly. 7. Nonobstructive nephrolithiasis. 8. Umbilical and inguinal fat hernias. Aortic Atherosclerosis (ICD10-I70.0). Electronically Signed   By: Almira Bar M.D.   On: 02/12/2023 23:15   MR BRAIN WO CONTRAST  Result Date: 02/12/2023 CLINICAL DATA:  Initial evaluation for acute TIA, right lower extremity weakness. EXAM: MRI HEAD WITHOUT CONTRAST TECHNIQUE: Multiplanar, multiecho pulse sequences of the brain and surrounding structures were obtained without intravenous contrast. COMPARISON:  Prior brain MRI from 01/24/2023. FINDINGS: Brain: Advanced cerebral atrophy for age. Extensive chronic microvascular ischemic disease involving the  supratentorial cerebral white matter. Multiple scattered remote lacunar infarcts present about the hemispheric cerebral white matter, deep gray nuclei, and pons. Multiple chronic bilateral cerebellar infarcts noted. Small remote bilateral occipital infarcts. There has been interval evolution of previously identified left ACA distribution infarct involving the left perirolandic cortex, less conspicuous as compared to previous (series 2, images 41, 39). Minimal associated petechial blood products without hemorrhagic transformation or mass effect (series 8, image 74). There are a few new subcentimeter foci of diffusion signal abnormality involving the contralateral right frontal lobe (series 2, image 43) as well as the right midbrain/pons (series 2, image 21). Findings are suspicious for additional small acute to early subacute ischemic infarcts. No associated hemorrhage or mass effect. Gray-white matter differentiation otherwise maintained. No acute intracranial hemorrhage. Multiple scattered chronic micro hemorrhages noted, presumably hypertensive in nature. No mass lesion, midline shift or mass effect. No hydrocephalus or extra-axial fluid collection. Pituitary gland suprasellar region within normal limits. Vascular: Loss of normal flow void within the intradural right V4 segment beyond the takeoff of the right PICA, which could reflect slow flow and/or occlusion, stable. Major intracranial vascular flow voids are otherwise maintained. Dolichoectatic appearance of the intracranial circulation noted. Skull and upper cervical spine: Craniocervical junction within normal limits. Bone marrow signal intensity normal. No scalp soft tissue abnormality. Sinuses/Orbits: Prior bilateral ocular lens replacement. Paranasal sinuses are clear. No significant mastoid effusion. Other: None. IMPRESSION: 1. Normal expected interval evolution of recently identified left posterior cerebral infarct. Minimal associated petechial blood  products without hemorrhagic transformation or mass effect. 2. Few new subcentimeter acute to early subacute ischemic infarcts involving the contralateral right frontal lobe and right midbrain/pons as above. No associated hemorrhage  or mass effect. 3. Underlying advanced atrophy with chronic small vessel ischemic disease with multiple chronic infarcts as above. Electronically Signed   By: Rise Mu M.D.   On: 02/12/2023 23:06   CT HEAD WO CONTRAST ( )  Result Date: 02/12/2023 CLINICAL DATA:  New onset headache EXAM: CT HEAD WITHOUT CONTRAST TECHNIQUE: Contiguous axial images were obtained from the base of the skull through the vertex without intravenous contrast. RADIATION DOSE REDUCTION: This exam was performed according to the departmental dose-optimization program which includes automated exposure control, adjustment of the mA and/or kV according to patient size and/or use of iterative reconstruction technique. COMPARISON:  MRI head 01/24/2023 FINDINGS: Brain: No intracranial hemorrhage, mass effect, or evidence of acute infarct. No hydrocephalus. No extra-axial fluid collection. Advanced cerebral atrophy and chronic small vessel ischemic disease. Cerebral atrophy. Chronic infarcts including in the bilateral occipital lobes, left temporal lobe and bilateral cerebellar hemispheres. Vascular: No hyperdense vessel. Intracranial arterial calcification. Skull: No fracture or focal lesion. Sinuses/Orbits: No acute finding. Other: None. IMPRESSION: 1. No evidence of acute intracranial abnormality. 2. Cerebral atrophy and chronic small vessel ischemic disease and multiple chronic infarcts. Electronically Signed   By: Minerva Fester M.D.   On: 02/12/2023 22:54   DG Chest Port 1 View  Result Date: 02/12/2023 CLINICAL DATA:  Nausea and vomiting EXAM: PORTABLE CHEST 1 VIEW COMPARISON:  12/06/2022 FINDINGS: Sternotomy and CABG. AVR. Stable cardiomediastinal silhouette. Aortic atherosclerotic calcification.  No focal consolidation, pleural effusion, or pneumothorax. No displaced rib fractures. IMPRESSION: No acute cardiopulmonary disease. Electronically Signed   By: Minerva Fester M.D.   On: 02/12/2023 21:18   MR MRV HEAD WO CM  Result Date: 01/26/2023 CLINICAL DATA:  Initial evaluation for possible dural venous sinus thrombosis. EXAM: MR VENOGRAM HEAD WITHOUT CONTRAST TECHNIQUE: Angiographic images of the intracranial venous structures were acquired using MRV technique without intravenous contrast. COMPARISON:  Prior MRI from 01/24/2023. FINDINGS: Normal flow related signal seen throughout the superior sagittal sinus to the torcula. Transverse and sigmoid sinuses are patent as are the jugular bulbs and visualized proximal internal jugular veins. Right transverse sinus dominant. Straight sinus, vein of Galen, internal cerebral veins, and basal veins of Rosenthal are patent. No evidence for dural venous sinus thrombosis. No appreciable cortical vein abnormality. No appreciable abnormality about the cavernous sinus. IMPRESSION: Normal intracranial MRV. No evidence for dural venous sinus thrombosis. Electronically Signed   By: Rise Mu M.D.   On: 01/26/2023 02:13   VAS US CAROTID  Result Date: 01/25/2023 Carotid Arterial Duplex Study Patient Name:  Shane Holloway  Date of Exam:   01/25/2023 Medical Rec #: [id number]          Accession #:    [id number] Date of Birth: 12/27/1950           Patient Gender: M Patient Age:   85 years Exam Location:  [facility] Procedure:      VAS US CAROTID Referring Phys: Dewitt Hoes DE LA TORRE --------------------------------------------------------------------------------  Indications:       CVA, Numbness, Weakness and Patient on Eliquis for history of                    DVT. However, Eliquis was held several days post dental                    procedure. Risk Factors:      Hypertension, hyperlipidemia, Diabetes, prior MI, prior CVA X  3. Other  Factors:     Bioprosthetic aortic valve. Comparison Study:  No prior study on file Performing Technologist: Sherren Kerns RVS  Examination Guidelines: A complete evaluation includes B-mode imaging, spectral Doppler, color Doppler, and power Doppler as needed of all accessible portions of each vessel. Bilateral testing is considered an integral part of a complete examination. Limited examinations for reoccurring indications may be performed as noted.  Right Carotid Findings: +----------+--------+--------+--------+------------------+------------------+           PSV cm/sEDV cm/sStenosisPlaque DescriptionComments           +----------+--------+--------+--------+------------------+------------------+ CCA Prox  37      9                                 intimal thickening +----------+--------+--------+--------+------------------+------------------+ CCA Distal45      10              heterogenous                         +----------+--------+--------+--------+------------------+------------------+ ICA Prox  48      11              heterogenous                         +----------+--------+--------+--------+------------------+------------------+ ICA Mid   48      15                                                   +----------+--------+--------+--------+------------------+------------------+ ICA Distal40      11                                tortuous           +----------+--------+--------+--------+------------------+------------------+ ECA       43      7                                                    +----------+--------+--------+--------+------------------+------------------+ +----------+--------+-------+--------+-------------------+           PSV cm/sEDV cmsDescribeArm Pressure (mmHG) +----------+--------+-------+--------+-------------------+ Subclavian100                                        +----------+--------+-------+--------+-------------------+  +---------+--------+--+--------+-+ VertebralPSV cm/s25EDV cm/s7 +---------+--------+--+--------+-+  Left Carotid Findings: +----------+--------+--------+--------+------------------+--------+           PSV cm/sEDV cm/sStenosisPlaque DescriptionComments +----------+--------+--------+--------+------------------+--------+ CCA Prox  50      5               heterogenous               +----------+--------+--------+--------+------------------+--------+ CCA Distal52      13              heterogenous               +----------+--------+--------+--------+------------------+--------+ ICA Prox  76      16  calcific                   +----------+--------+--------+--------+------------------+--------+ ICA Mid   53      15                                         +----------+--------+--------+--------+------------------+--------+ ICA Distal51      15                                         +----------+--------+--------+--------+------------------+--------+ ECA       87      2               calcific                   +----------+--------+--------+--------+------------------+--------+ +----------+--------+--------+--------+-------------------+           PSV cm/sEDV cm/sDescribeArm Pressure (mmHG) +----------+--------+--------+--------+-------------------+ [id number]                                          +----------+--------+--------+--------+-------------------+ +---------+--------+--+--------+-+ VertebralPSV cm/s33EDV cm/s9 +---------+--------+--+--------+-+ Incidental DVT noted in the Internal Jugular Vein  Summary: Right Carotid: Velocities in the right ICA are consistent with a 1-39% stenosis. Left Carotid: Velocities in the left ICA are consistent with a 1-39% stenosis.               Incidental finding of acute DVT in the left internal jugular vein.               Right internal jugular vein and bilateral subclavian veins appear                patent. Vertebrals:  Bilateral vertebral arteries demonstrate antegrade flow. Subclavians: Normal flow hemodynamics were seen in bilateral subclavian              arteries. *See table(s) above for measurements and observations.  Electronically signed by Marvel Plan MD on 01/25/2023 at 6:30:41 PM.    Final    VAS [place] TRANSCRANIAL DOPPLER W BUBBLES  Result Date: 01/25/2023  Transcranial Doppler with Bubble Patient Name:  Shane Holloway  Date of Exam:   01/25/2023 Medical Rec #: [id number]          Accession #:    [id number] Date of Birth: 1951/03/10           Patient Gender: M Patient Age:   33 years Exam Location:  [facility] Procedure:      VAS [place] TRANSCRANIAL DOPPLER W BUBBLES Referring Phys: Scheryl Marten XU --------------------------------------------------------------------------------  Indications: Acute IJV DVT and stroke. Limitations for diagnostic windows: Unable to insonate right transtemporal window. Comparison Study: No prior study Performing Technologist: Sherren Kerns RVS  Examination Guidelines: A complete evaluation includes B-mode imaging, spectral Doppler, color Doppler, and power Doppler as needed of all accessible portions of each vessel. Bilateral testing is considered an integral part of a complete examination. Limited examinations for reoccurring indications may be performed as noted.  Summary: No HITS at rest or during Valsalva. Negative transcranial Doppler Bubble study with no evidence of right to left intracardiac communication.  *See table(s) above for TCD measurements and observations.  Diagnosing physician: Marvel Plan MD Electronically signed by Marvel Plan  MD on 01/25/2023 at 6:28:25 PM.    Final    ECHOCARDIOGRAM COMPLETE BUBBLE STUDY  Result Date: 01/25/2023    ECHOCARDIOGRAM REPORT   Patient Name:   Shane Holloway Date of Exam: 01/25/2023 Medical Rec #:  [id number]         Height:       73.5 in Accession #:    [id number]        Weight:       232.0 lb Date of Birth:  [birth year]           BSA:          2.303 m Patient Age:    72 years          BP:           126/81 mmHg Patient Gender: M                 HR:           60 bpm. Exam Location:  Inpatient Procedure: 3D Echo, Cardiac Doppler, Color Doppler and Saline Contrast Bubble            Study Indications:    Stroke I63.9  History:        Patient has prior history of Echocardiogram examinations, most                 recent 12/31/2021. Prior CABG, Stroke; Risk Factors:Diabetes,                 Hypertension and Former Smoker. S/P aortic valve replacement                 with bioprosthetic valve                 2022.                 Aortic Valve: bioprosthetic valve is present in the aortic                 position.  Sonographer:    Dondra Prader RVT RCS Referring Phys: [id number] Cecille Po MELVIN IMPRESSIONS  1. Left ventricular ejection fraction, by estimation, is 55 to 60%. The left ventricle has normal function. The left ventricle has no regional wall motion abnormalities. There is moderate concentric left ventricular hypertrophy. Left ventricular diastolic parameters are consistent with Grade I diastolic dysfunction (impaired relaxation).  2. Right ventricular systolic function is normal. The right ventricular size is normal. Tricuspid regurgitation signal is inadequate for assessing PA pressure.  3. The mitral valve is grossly normal. Trivial mitral valve regurgitation. No evidence of mitral stenosis.  4. The aortic valve has been repaired/replaced. Aortic valve regurgitation is mild. There is a bioprosthetic valve present in the aortic position.  5. Aortic dilatation noted. There is mild dilatation of the aortic root, measuring 41 mm. Comparison(s): Changes from prior study are noted. The left ventricular function has improved. 12/31/21- EF 35-40%. Conclusion(s)/Recommendation(s): No intracardiac source of embolism detected on this transthoracic study. Consider a transesophageal echocardiogram to exclude cardiac source of embolism if clinically  indicated. FINDINGS  Left Ventricle: Left ventricular ejection fraction, by estimation, is 55 to 60%. The left ventricle has normal function. The left ventricle has no regional wall motion abnormalities. The left ventricular internal cavity size was normal in size. There is  moderate concentric left ventricular hypertrophy. Abnormal (paradoxical) septal motion consistent with post-operative status. Left ventricular diastolic parameters are consistent with Grade I diastolic dysfunction (impaired relaxation). Right Ventricle: The  right ventricular size is normal. No increase in right ventricular wall thickness. Right ventricular systolic function is normal. Tricuspid regurgitation signal is inadequate for assessing PA pressure. Left Atrium: Left atrial size was normal in size. Right Atrium: Right atrial size was normal in size. Pericardium: There is no evidence of pericardial effusion. Presence of epicardial fat layer. Mitral Valve: The mitral valve is grossly normal. Trivial mitral valve regurgitation. No evidence of mitral valve stenosis. Tricuspid Valve: The tricuspid valve is grossly normal. Tricuspid valve regurgitation is not demonstrated. No evidence of tricuspid stenosis. Aortic Valve: The aortic valve has been repaired/replaced. Aortic valve regurgitation is mild. Aortic valve mean gradient measures 6.7 mmHg. Aortic valve peak gradient measures 12.3 mmHg. Aortic valve area, by VTI measures 3.73 cm. There is a bioprosthetic valve present in the aortic position. Pulmonic Valve: The pulmonic valve was grossly normal. Pulmonic valve regurgitation is trivial. No evidence of pulmonic stenosis. Aorta: Aortic dilatation noted. There is mild dilatation of the aortic root, measuring 41 mm. Venous: The inferior vena cava was not well visualized. IAS/Shunts: The atrial septum is grossly normal. Agitated saline contrast was given intravenously to evaluate for intracardiac shunting.  LEFT VENTRICLE PLAX 2D LVIDd:          5.30 cm   Diastology LVIDs:         3.60 cm   LV e' medial:    4.26 cm/s LV PW:         1.50 cm   LV E/e' medial:  9.4 LV IVS:        1.50 cm   LV e' lateral:   4.40 cm/s LVOT diam:     2.87 cm   LV E/e' lateral: 9.1 LV SV:         143 LV SV Index:   62 LVOT Area:     6.45 cm  RIGHT VENTRICLE            IVC RV S prime:     4.26 cm/s  IVC diam: 1.50 cm LEFT ATRIUM             Index LA diam:        4.40 cm 1.91 cm/m LA Vol (A2C):   46.7 ml 20.27 ml/m LA Vol (A4C):   50.8 ml 22.05 ml/m LA Biplane Vol: 50.1 ml 21.75 ml/m  AORTIC VALVE                     PULMONIC VALVE AV Area (Vmax):    3.94 cm      PV Vmax:       0.66 m/s AV Area (Vmean):   3.72 cm      PV Peak grad:  1.7 mmHg AV Area (VTI):     3.73 cm AV Vmax:           175.67 cm/s AV Vmean:          118.667 cm/s AV VTI:            0.382 m AV Peak Grad:      12.3 mmHg AV Mean Grad:      6.7 mmHg LVOT Vmax:         107.27 cm/s LVOT Vmean:        68.333 cm/s LVOT VTI:          0.221 m LVOT/AV VTI ratio: 0.58  AORTA Ao Root diam: 3.70 cm Ao Asc diam:  3.20 cm MITRAL VALVE MV Area (PHT): 2.68 cm  SHUNTS MV Decel Time: 284 msec    Systemic VTI:  0.22 m MV E velocity: 39.95 cm/s  Systemic Diam: 2.87 cm MV A velocity: 56.00 cm/s MV E/A ratio:  0.71 Lennie Odor MD Electronically signed by Lennie Odor MD Signature Date/Time: 01/25/2023/5:36:34 PM    Final    MR ANGIO HEAD WO CONTRAST  Result Date: 01/24/2023 CLINICAL DATA:  Follow-up examination for stroke. EXAM: MRA HEAD WITHOUT CONTRAST TECHNIQUE: Angiographic images of the Circle of Willis were acquired using MRA technique without intravenous contrast. COMPARISON:  Prior MRI from earlier the same day. FINDINGS: Anterior circulation: Both internal carotid arteries are patent through the siphons without stenosis or other abnormality. Right A1 segment dominant and widely patent. Left A1 is hypoplastic and grossly patent on time-of-flight sequence. Tiny 2 mm outpouching extending from the over surface of the  anterior communicating artery complex favored to reflect a small vascular infundibulum related to a hypoplastic left A1 segment. Anterior communicating artery complex otherwise unremarkable. Both ACAs patent without proximal stenosis. No M1 stenosis or occlusion. No proximal MCA branch occlusion or high-grade stenosis. Distal MCA branches perfused and symmetric. Posterior circulation: Left vertebral artery strongly dominant and widely patent. Left PICA patent. Visualized right vertebral artery is hypoplastic and largely terminates in PICA. Right PICA patent as well. Basilar widely patent without stenosis. Superior cerebral arteries patent bilaterally. Both PCAs primarily supplied via the basilar. PCAs patent distal aspects without proximal stenosis. Anatomic variants: Hypoplastic right vertebral artery terminates in PICA. Hypoplastic left A1 segment. Other: No intracranial aneurysm. IMPRESSION: 1. Negative intracranial MRA for large vessel occlusion or other emergent finding. No hemodynamically significant or correctable stenosis. 2. 2 mm outpouching extending inferiorly from the anterior communicating artery complex, favored to reflect a small vascular infundibulum related to a hypoplastic left A1 segment. Small aneurysm disc felt to exclude. Attention at follow-up recommended. Electronically Signed   By: Rise Mu M.D.   On: 01/24/2023 19:42   MR BRAIN WO CONTRAST  Result Date: 01/24/2023 CLINICAL DATA:  Neuro deficit with acute stroke suspected EXAM: MRI HEAD WITHOUT CONTRAST TECHNIQUE: Multiplanar, multiecho pulse sequences of the brain and surrounding structures were obtained without intravenous contrast. COMPARISON:  Head CT from earlier today FINDINGS: Brain: Small acute infarct in the high and left perirolandic cortex. Diffusion alteration along the superior left frontal cortex attributed to susceptibility artifact when correlated with gradient imaging. Advanced chronic small vessel ischemic  gliosis in the cerebral white matter with chronic lacunar infarcts in the deep gray nuclei and deep white matter bilaterally. Chronic cortically based infarcts along the left more than right occipital cortex and in the inferior left temporal occipital cortex- site highlighted on prior head CT. Small chronic bilateral cerebellar infarcts. Brain volume is normal for age. 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 No acute hemorrhage, hydrocephalus, or collection. Vascular: Normal flow voids. Skull and upper cervical spine: Normal marrow signal. Sinuses/Orbits: No acute finding IMPRESSION: 1. Small acute infarct in the superior left perirolandic cortex. 2. Widespread chronic ischemic injury as described. Electronically Signed   By: Tiburcio Pea M.D.   On: 01/24/2023 14:20   CT HEAD WO CONTRAST  Result Date: 01/24/2023 CLINICAL DATA:  Right lower extremity weakness/numbness. EXAM: CT HEAD WITHOUT CONTRAST TECHNIQUE: Contiguous axial images were obtained from the base of the skull through the vertex without intravenous contrast. RADIATION DOSE REDUCTION: This exam was performed according to the departmental dose-optimization program which includes automated exposure control, adjustment  of the mA and/or kV according to patient size and/or use of iterative reconstruction technique. COMPARISON:  CT head 06/06/2022 FINDINGS: Brain: There is no evidence of acute intracranial hemorrhage or extra-axial fluid collection. There is hypodensity with loss of gray-white differentiation along the undersurface of the left temporal occipital region which is new since the prior study (6-49, 5-44). Finding is consistent with an interval infarct, possibly subacute Numerous additional remote infarcts in the bilateral occipital lobes, right frontal cortex, hemispheric white matter, bilateral cerebellar hemispheres, and left basal ganglia are unchanged.  Parenchymal volume is stable. The ventricles are stable in size. The pituitary and suprasellar region are normal. There is no mass lesion. There is no mass effect or midline shift. Vascular: There is calcification of the bilateral carotid siphons and vertebral arteries. Skull: Normal. Negative for fracture or focal lesion. Sinuses/Orbits: The paranasal sinuses are clear. Bilateral lens implants are in place. The globes and orbits are otherwise unremarkable. Other: The mastoid air cells and middle ear cavities are clear. IMPRESSION: 1. New hypodensity with loss of gray-white differentiation along the undersurface of the left temporal occipital region consistent with interval infarct, possibly subacute. Consider brain MRI for characterization. 2. No acute intracranial hemorrhage. Electronically Signed   By: Lesia Hausen M.D.   On: 01/24/2023 11:42   (Echo, Carotid, EGD, Colonoscopy, ERCP)    Subjective: Patient seen and examined.  Denies any complaints.  Today, he was able to stand up and blood pressure dropped from 136-106 but he did not feel dizzy.  Looking forward to go to rehab.   Discharge Exam: Vitals:   02/18/23 0744 02/18/23 0911  BP:  (!) 150/77  Pulse: 67 62  Resp:    Temp: 98.1 F (36.7 C)   SpO2: 97%    Vitals:   02/18/23 0027 02/18/23 0400 02/18/23 0744 02/18/23 0911  BP: 128/82 (!) 130/92  (!) 150/77  Pulse: 66 67 67 62  Resp:  20    Temp: 97.9 F (36.6 C) 98 F (36.7 C) 98.1 F (36.7 C)   TempSrc: Oral Oral Oral   SpO2: 95% 94% 97%   Weight:      Height:        General: Pt is alert, awake, not in acute distress Cardiovascular: RRR, S1/S2 +, no rubs, no gallops Respiratory: CTA bilaterally, no wheezing, no rhonchi Abdominal: Soft, NT, ND, bowel sounds + Extremities: no edema, no cyanosis    The results of significant diagnostics from this hospitalization (including imaging, microbiology, ancillary and laboratory) are listed below for reference.      Microbiology: No results found for this or any previous visit (from the past 240 hour(s)).   Labs: BNP (last 3 results) Recent Labs    09/14/22 1903  BNP 88.4   Basic Metabolic Panel: Recent Labs  Lab 02/14/23 0607 02/15/23 1154 02/16/23 0805 02/17/23 0739 02/18/23 0546  NA 145 138 138 135 137  K 3.7 3.5 3.5 3.3* 3.7  CL 110 104 102 104 106  CO2 22 26 23 23 23   GLUCOSE 123* 206* 173* 164* 150*  BUN 26* 29* 31* 33* 37*  CREATININE 2.47* 2.69* 2.72* 2.77* 2.97*  CALCIUM 9.7 9.2 9.0 8.4* 8.4*   Liver Function Tests: Recent Labs  Lab 02/12/23 2034 02/14/23 0607  AST 19 14*  ALT 24 18  ALKPHOS 96 101  BILITOT 0.8 1.6*  PROT 6.5 6.3*  ALBUMIN 3.8 3.6   Recent Labs  Lab 02/12/23 2034  LIPASE 46   No results  for input(s): "AMMONIA" in the last 168 hours. CBC: Recent Labs  Lab 02/14/23 0607 02/15/23 1154 02/16/23 0805 02/17/23 0739 02/18/23 0546  WBC 7.8 6.7 5.3 5.8 6.4  HGB 13.8 14.0 12.4* 12.5* 12.3*  HCT 41.8 43.2 37.8* 37.4* 37.6*  MCV 88.4 89.3 85.3 87.4 86.2  PLT 146* 151 122* 115* 119*   Cardiac Enzymes: No results for input(s): "CKTOTAL", "CKMB", "CKMBINDEX", "TROPONINI" in the last 168 hours. BNP: Invalid input(s): "POCBNP" CBG: Recent Labs  Lab 02/17/23 0620 02/17/23 1145 02/17/23 1708 02/17/23 2126 02/18/23 0634  GLUCAP 141* 219* 188* 184* 128*   D-Dimer No results for input(s): "DDIMER" in the last 72 hours. Hgb A1c No results for input(s): "HGBA1C" in the last 72 hours. Lipid Profile No results for input(s): "CHOL", "HDL", "LDLCALC", "TRIG", "CHOLHDL", "LDLDIRECT" in the last 72 hours. Thyroid function studies No results for input(s): "TSH", "T4TOTAL", "T3FREE", "THYROIDAB" in the last 72 hours.  Invalid input(s): "FREET3" Anemia work up No results for input(s): "VITAMINB12", "FOLATE", "FERRITIN", "TIBC", "IRON", "RETICCTPCT" in the last 72 hours. Urinalysis    Component Value Date/Time   COLORURINE YELLOW 02/13/2023  0014   APPEARANCEUR HAZY (A) 02/13/2023 0014   LABSPEC 1.017 02/13/2023 0014   PHURINE 5.0 02/13/2023 0014   GLUCOSEU >=500 (A) 02/13/2023 0014   HGBUR MODERATE (A) 02/13/2023 0014   BILIRUBINUR NEGATIVE 02/13/2023 0014   BILIRUBINUR neg 08/10/2011 1236   KETONESUR NEGATIVE 02/13/2023 0014   PROTEINUR 100 (A) 02/13/2023 0014   UROBILINOGEN 0.2 08/10/2011 1236   UROBILINOGEN 0.2 07/27/2007 1303   NITRITE NEGATIVE 02/13/2023 0014   LEUKOCYTESUR TRACE (A) 02/13/2023 0014   Sepsis Labs Recent Labs  Lab 02/15/23 1154 02/16/23 0805 02/17/23 0739 02/18/23 0546  WBC 6.7 5.3 5.8 6.4   Microbiology No results found for this or any previous visit (from the past 240 hour(s)).   Time coordinating discharge: 35 minutes  SIGNED:   Dorcas Carrow, MD  [facility] 02/18/2023, 9:59 AM

## 2023-02-18 NOTE — H&P (Signed)
Physical Medicine and Rehabilitation Admission H&P   CC: Functional deficits secondary to acute to subacute strokes  HPI: Shane Holloway si a 72 year old male who presented to the [facility] ED on /9/04 with dizziness and N/V. He had been hospitalized from 01/24/2023 through 01/27/2023 with acute superior left perirolandic cortex infarct. Also diagnosed with acute left internal jugular and subclavian DVT placed on eliquis and aspirin. New MRI showed acute to subacute strokes. Consulted Dr. Amada Jupiter from neurology as well as general surgery. Highly concerning for central embolic source.  He had both a transcranial Doppler as well as an echo with bubble which did not demonstrate any right-to-left shunt either cardiac or otherwise.  He did not have vascular imaging of the aorta or proximal great vessels because of his elevated creatinine, and Dr. Amada Jupiter ordered MRA without contrast of the head and neck. CT ab/pel showed dilated ileal small bowel segments in the right upper to mid abdomen with collapsed mid/lower small bowel consistent with intermediate to high-grade SBO; exact transition point was not found; etiology could be enteritis, occult adhesions or occult internal hernia. NGT placed. Etiology of stroke uncontrolled hypertension +/- suspected Afib or paradoxical emboli. 2D Echo (8/17): No intracardiac source of embolism detected. LDL 2. HgbA1c 8.8 VTE prophylaxis - IV Heparin drip, DVTs, holding home eliquis, per surgery. NGT removed 9/06 and started on clear liquids. VAS [place] Transcranial bubble doppler 8/17: negative. MRA of head and neck unremarkable. Transitioned from heparin to Eliquis 5 BID. 30 day monitor recommended. He tolerated exercises well but needs cues to initiate intermittently, inconsistently following single step commands. The patient requires inpatient medicine and rehabilitation evaluations and services for ongoing dysfunction secondary to acute to subacute strokes, vascular  dementia.  PMH significant for DVT on Eliquis which he had held for dental procedure PTA on 8/16. He is s/p AVR, has hx of vascular dementia, CHF, CAD, DM, hypertension, hyperlipidemia, BPH and CKD.  LBM yesterday Using urinal  No pain   Review of Systems  Constitutional:  Positive for malaise/fatigue.  HENT: Negative.    Eyes: Negative.   Respiratory: Negative.    Cardiovascular: Negative.   Gastrointestinal:  Negative for nausea and vomiting.  Genitourinary: Negative.   Musculoskeletal: Negative.   Skin: Negative.   Neurological:  Positive for dizziness and focal weakness. Negative for sensory change.  Endo/Heme/Allergies: Negative.   Psychiatric/Behavioral:         Cognitive impairment  All other systems reviewed and are negative.  Past Medical History:  Diagnosis Date   Acute on chronic combined systolic and diastolic CHF (congestive heart failure) (HCC) 11/24/2008   Qualifier: Diagnosis of   By: Johnnette Barrios, CNA, Sandy         Acute renal failure with acute tubular necrosis superimposed on stage 3b chronic kidney disease (HCC) 12/15/2021   Allergy    Aortic stenosis    Arthritis    hands   BPH (benign prostatic hyperplasia)    CHF (congestive heart failure) (HCC)    CKD (chronic kidney disease)    COVID-19    COVID-19 virus infection 12/16/2021   Diabetes mellitus    Dyspnea    DYSPNEA 11/24/2008   Qualifier: Diagnosis of   By: Johnnette Barrios, CNA, Sandy         Hyperlipidemia    Hypertension    Hypertensive urgency 12/10/2021   Myocardial infarction [facility]) 06/10/2002   NSTEMI (non-ST elevated myocardial infarction) (HCC) 12/10/2021   Stroke (HCC) 09/2020   Some memory deficits  Vertigo    Wears dentures    full upper   Past Surgical History:  Procedure Laterality Date   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  Redo of 2V CABG, Aortic Root repair and AVR   CATARACT EXTRACTION W/PHACO Right 11/19/2021   Procedure: CATARACT  EXTRACTION PHACO AND INTRAOCULAR LENS PLACEMENT (IOC) RIGHT DIABETIC;  Surgeon: Nevada Crane, MD;  Location: [facility];  Service: Ophthalmology;  Laterality: Right;  7.32 0:54.3   CATARACT EXTRACTION W/PHACO Left 12/03/2021   Procedure: CATARACT EXTRACTION PHACO AND INTRAOCULAR LENS PLACEMENT (IOC) LEFT DIABETIC;  Surgeon: Estanislado Pandy, MD;  Location: [facility];  Service: Ophthalmology;  Laterality: Left;  LEAVE FIRST CASE Diabetic 3.74 00:36.3   CORONARY ARTERY BYPASS GRAFT  2009   x4; stents to diagonal a mid right coronary lesion; drug eluting stent in the mid RCA as well as ostium of the diagonal with a cutting balloon angioplasty of the diagonal.   CORONARY STENT INTERVENTION N/A 12/14/2021   Procedure: CORONARY STENT INTERVENTION;  Surgeon: Iran Ouch, MD;  Location: [facility];  Service: Cardiovascular;  Laterality: N/A;   CORONARY ULTRASOUND/IVUS N/A 12/14/2021   Procedure: Intravascular Ultrasound/IVUS;  Surgeon: Iran Ouch, MD;  Location: [facility];  Service: Cardiovascular;  Laterality: N/A;   JOINT REPLACEMENT     RIGHT HEART CATH AND CORONARY/GRAFT ANGIOGRAPHY N/A 12/12/2021   Procedure: RIGHT HEART CATH AND CORONARY/GRAFT ANGIOGRAPHY;  Surgeon: Yvonne Kendall, MD;  Location: [facility] IN[facility]AS[facility] CV [facility];  Service: Cardiovascular;  Laterality: N/A;   Family History  Problem Relation Age of Onset   Heart disease Mother    Heart disease Father    Social History:  reports that he quit smoking about 29 years ago. His smoking use included cigarettes. He started smoking about 33 years ago. He has a 1 pack-year smoking history. He quit smokeless tobacco use about 29 years ago.  His smokeless tobacco use included chew. He reports that he does not drink alcohol and does not use drugs. Allergies:  Allergies  Allergen Reactions   Altace [Ramipril] Cough   Shellfish Allergy Nausea And Vomiting   Sulfa Antibiotics Hives    Medications Prior to Admission  Medication Sig Dispense Refill   amiodarone (PACERONE) 200 MG tablet Take 1 tablet by mouth daily.     apixaban (ELIQUIS) 5 MG TABS tablet Take 2 tablets (10 mg total) by mouth 2 (two) times daily for 6 days, THEN 1 tablet (5 mg total) 2 (two) times daily. 204 tablet 0   aspirin EC 81 MG tablet Take 81 mg by mouth daily.     atorvastatin (LIPITOR) 80 MG tablet Take 80 mg by mouth daily.     empagliflozin (JARDIANCE) 10 MG TABS tablet Take 1 tablet by mouth daily.     ENTRESTO 49-51 MG Take 1 tablet by mouth 2 (two) times daily.     ezetimibe (ZETIA) 10 MG tablet Take 10 mg by mouth daily.     finasteride (PROSCAR) 5 MG tablet Take 5 mg by mouth daily.     insulin detemir (LEVEMIR) 100 UNIT/ML injection Inject 0.12 mLs (12 Units total) into the skin at bedtime.     Insulin NPH, Human,, Isophane, (HUMULIN N) 100 UNIT/ML Kiwkpen Inject 10 Units into the skin 2 (two) times daily.     metoprolol succinate (TOPROL-XL) 50 MG 24 hr tablet Take 50 mg by mouth daily.     nitroGLYCERIN (  NITROSTAT) 0.4 MG SL tablet Place under the tongue.     Semaglutide, 1 MG/DOSE, (OZEMPIC, 1 MG/DOSE,) 4 MG/3ML SOPN Inject 1 mg into the skin once a week.     tamsulosin (FLOMAX) 0.4 MG CAPS capsule Take 0.4 mg by mouth daily.        Home: Home Living Family/patient expects to be discharged to:: Private residence Living Arrangements: Alone Available Help at Discharge: Family, Friend(s), Available 24 hours/day Type of Home: Mobile home Home Access: Ramped entrance Home Layout: One level Bathroom Shower/Tub: Health visitor: Standard Bathroom Accessibility: Yes Home Equipment: Agricultural consultant (2 wheels), [employer] - single point, Wheelchair - manual, Information systems manager Additional Comments: States he went to rehab after d/c from hosptial last month and recently returned home with HHPT. Has been ambulating without assistive device and is independent with ADLs.  Lives With:  Alone   Functional History: Prior Function Prior Level of Function : Needs assist, History of Falls (last six months) Mobility Comments: Reports he has been with working with HHPT and has progressed to a level of independence at home where he no longer uses ambulatory equipment. ADLs Comments: Caretaking approx. 2x per week who assists with IADLs and transportation. Independent with ADLs.  Functional Status:  Mobility: Bed Mobility Overal bed mobility: Needs Assistance Bed Mobility: Supine to Sit, Sit to Supine Supine to sit: Used rails, Contact guard Sit to supine: Contact guard assist General bed mobility comments: Able to bring LEs out of and back into bed today with CGA. Cues to facilitate. Transfers Overall transfer level: Needs assistance Equipment used: Rolling walker (2 wheels) Transfers: Sit to/from Stand, Bed to chair/wheelchair/BSC Sit to Stand: Mod assist Bed to/from chair/wheelchair/BSC transfer type:: Step pivot Step pivot transfers: Mod assist General transfer comment: Deferred due to symptomatic orthostatic hypotension Ambulation/Gait Ambulation/Gait assistance: Min assist Gait Distance (Feet): 35 Feet (x2 trials with seated break in bathroom) Assistive device: Rolling walker (2 wheels) Gait Pattern/deviations: Decreased stance time - right, Decreased dorsiflexion - right, Decreased stride length, Step-through pattern, Ataxic, Shuffle, Trunk flexed, Wide base of support General Gait Details: Min assist for balance and RW control with turns. Intermittent cues for walker placement/proximity to device and upright stance. RLE foot drop with slightly improved step length/height with constant cues for sequencing/posture. Pt may benefit from RLE AFO pending progress next session; pt able to somewhat DF RLE while seated but poor DF ROM against gravity during gait trial. Gait velocity: decr Gait velocity interpretation: <1.31 ft/sec, indicative of household ambulator Pre-gait  activities: weight shift and march in place with RW support    ADL: ADL Overall ADL's : Needs assistance/impaired Eating/Feeding: Set up, Sitting Grooming: Set up, Sitting Upper Body Bathing: Minimal assistance, Cueing for safety, Cueing for sequencing, Sitting Upper Body Dressing : Minimal assistance, Sitting Toilet Transfer: Moderate assistance, Cueing for safety, Cueing for sequencing, Rolling walker (2 wheels) Toilet Transfer Details (indicate cue type and reason): simulated during transfer from eob with pivotal steps to the recliner Toileting- Clothing Manipulation and Hygiene: Maximal assistance, Sit to/from stand Toileting - Clothing Manipulation Details (indicate cue type and reason): rn present during session and assisted pt. clean up of buttocks and hip area Functional mobility during ADLs: Minimal assistance, Moderate assistance, Cueing for sequencing, Rolling walker (2 wheels), Cueing for safety General ADL Comments: cues for sequencing and hand placement prior to sitting down, increased time for pt. to follow the one step commands for hand placement  Cognition: Cognition Overall Cognitive Status: No family/caregiver present  to determine baseline cognitive functioning Orientation Level: Oriented to person, Oriented to place, Oriented to situation, Disoriented to time Cognition Arousal: Alert Behavior During Therapy: [facility] for tasks assessed/performed Overall Cognitive Status: No family/caregiver present to determine baseline cognitive functioning General Comments: following one step commands inconsistently- a little dealy in processing  Physical Exam: Blood pressure (!) 148/85, pulse 64, temperature 98.2 F (36.8 C), temperature source Oral, resp. rate 20, height 6\' 2"  (1.88 m), weight 100 kg, SpO2 97%. Physical Exam Vitals and nursing note reviewed.  Constitutional:      General: He is not in acute distress.    Appearance: He is normal weight.     Comments: Sitting up in  bed; but doesn't fit in bed well; NAD  HENT:     Head: Normocephalic and atraumatic.     Comments: No facial droop No tongue deviation    Right Ear: External ear normal.     Left Ear: External ear normal.     Nose: Nose normal. No congestion.     Mouth/Throat:     Mouth: Mucous membranes are dry.     Pharynx: Oropharynx is clear. No oropharyngeal exudate.  Eyes:     Extraocular Movements: Extraocular movements intact.     Comments: L nystagmus  Cardiovascular:     Rate and Rhythm: Normal rate and regular rhythm.     Heart sounds: Normal heart sounds. No murmur heard.    No gallop.  Pulmonary:     Effort: Pulmonary effort is normal. No respiratory distress.     Breath sounds: Normal breath sounds. No wheezing, rhonchi or rales.  Abdominal:     General: Bowel sounds are normal. There is no distension.     Palpations: Abdomen is soft.     Tenderness: There is no abdominal tenderness.  Musculoskeletal:     Cervical back: Neck supple. No tenderness.     Comments: UE 5/5 B/L LLE- HF 4+/5, otherwise 5/5 RLE- HF 4+/5, KE 4+/5; DF 4/5; PF 5-/5 EHL 2/5  Skin:    General: Skin is warm and dry.  Neurological:     Mental Status: He is alert.     Comments: Finger to nose slow and searching on both sides Off balance when sitting upright Needed a lot of cues to finish 1 step commands       Results for orders placed or performed during the hospital encounter of 02/12/23 (from the past 48 hour(s))  Glucose, capillary     Status: Abnormal   Collection Time: 02/16/23  4:48 PM  Result Value Ref Range   Glucose-Capillary 152 (H) 70 - 99 mg/dL    Comment: Glucose reference range applies only to samples taken after fasting for at least 8 hours.   Comment 1 Notify RN    Comment 2 Document in Chart   Glucose, capillary     Status: Abnormal   Collection Time: 02/16/23  9:24 PM  Result Value Ref Range   Glucose-Capillary 182 (H) 70 - 99 mg/dL    Comment: Glucose reference range applies only  to samples taken after fasting for at least 8 hours.   Comment 1 Notify RN   Glucose, capillary     Status: Abnormal   Collection Time: 02/17/23  6:20 AM  Result Value Ref Range   Glucose-Capillary 141 (H) 70 - 99 mg/dL    Comment: Glucose reference range applies only to samples taken after fasting for at least 8 hours.   Comment 1 Notify RN  CBC     Status: Abnormal   Collection Time: 02/17/23  7:39 AM  Result Value Ref Range   WBC 5.8 4.0 - 10.5 K/uL   RBC 4.28 4.22 - 5.81 MIL/uL   Hemoglobin 12.5 (L) 13.0 - 17.0 g/dL   HCT 84.6 (L) 96.2 - 95.2 %   MCV 87.4 80.0 - 100.0 fL   MCH 29.2 26.0 - 34.0 pg   MCHC 33.4 30.0 - 36.0 g/dL   RDW 84.1 32.4 - 40.1 %   Platelets 115 (L) 150 - 400 K/uL    Comment: REPEATED TO VERIFY   nRBC 0.0 0.0 - 0.2 %    Comment: Performed at [facility], [address], [place], [place] [address]  Basic metabolic panel     Status: Abnormal   Collection Time: 02/17/23  7:39 AM  Result Value Ref Range   Sodium 135 135 - 145 mmol/L   Potassium 3.3 (L) 3.5 - 5.1 mmol/L   Chloride 104 98 - 111 mmol/L   CO2 23 22 - 32 mmol/L   Glucose, Bld 164 (H) 70 - 99 mg/dL    Comment: Glucose reference range applies only to samples taken after fasting for at least 8 hours.   BUN 33 (H) 8 - 23 mg/dL   Creatinine, Ser 3.66 (H) 0.61 - 1.24 mg/dL   Calcium 8.4 (L) 8.9 - 10.3 mg/dL   GFR, Estimated 23 (L) >60 mL/min    Comment: (NOTE) Calculated using the CKD-EPI Creatinine Equation (2021)    Anion gap 8 5 - 15    Comment: Performed at [facility], [address] [place], [place] [address]  Glucose, capillary     Status: Abnormal   Collection Time: 02/17/23 11:45 AM  Result Value Ref Range   Glucose-Capillary 219 (H) 70 - 99 mg/dL    Comment: Glucose reference range applies only to samples taken after fasting for at least 8 hours.  Glucose, capillary     Status: Abnormal   Collection Time: 02/17/23  5:08 PM  Result Value Ref Range   Glucose-Capillary  188 (H) 70 - 99 mg/dL    Comment: Glucose reference range applies only to samples taken after fasting for at least 8 hours.  Glucose, capillary     Status: Abnormal   Collection Time: 02/17/23  9:26 PM  Result Value Ref Range   Glucose-Capillary 184 (H) 70 - 99 mg/dL    Comment: Glucose reference range applies only to samples taken after fasting for at least 8 hours.  CBC     Status: Abnormal   Collection Time: 02/18/23  5:46 AM  Result Value Ref Range   WBC 6.4 4.0 - 10.5 K/uL   RBC 4.36 4.22 - 5.81 MIL/uL   Hemoglobin 12.3 (L) 13.0 - 17.0 g/dL   HCT 74.2 (L) 59.5 - 63.8 %   MCV 86.2 80.0 - 100.0 fL   MCH 28.2 26.0 - 34.0 pg   MCHC 32.7 30.0 - 36.0 g/dL   RDW 75.6 43.3 - 29.5 %   Platelets 119 (L) 150 - 400 K/uL    Comment: CONSISTENT WITH PREVIOUS RESULT REPEATED TO VERIFY    nRBC 0.0 0.0 - 0.2 %    Comment: Performed at [facility], [address], [place], [place] [address]  Basic metabolic panel     Status: Abnormal   Collection Time: 02/18/23  5:46 AM  Result Value Ref Range   Sodium 137 135 - 145 mmol/L  Potassium 3.7 3.5 - 5.1 mmol/L   Chloride 106 98 - 111 mmol/L   CO2 23 22 - 32 mmol/L   Glucose, Bld 150 (H) 70 - 99 mg/dL    Comment: Glucose reference range applies only to samples taken after fasting for at least 8 hours.   BUN 37 (H) 8 - 23 mg/dL   Creatinine, Ser 3.71 (H) 0.61 - 1.24 mg/dL   Calcium 8.4 (L) 8.9 - 10.3 mg/dL   GFR, Estimated 21 (L) >60 mL/min    Comment: (NOTE) Calculated using the CKD-EPI Creatinine Equation (2021)    Anion gap 8 5 - 15    Comment: Performed at [facility], [address] [place], [place] [address]  Glucose, capillary     Status: Abnormal   Collection Time: 02/18/23  6:34 AM  Result Value Ref Range   Glucose-Capillary 128 (H) 70 - 99 mg/dL    Comment: Glucose reference range applies only to samples taken after fasting for at least 8 hours.   No results found.    Blood pressure (!) 148/85, pulse 64,  temperature 98.2 F (36.8 C), temperature source Oral, resp. rate 20, height 6\' 2"  (1.88 m), weight 100 kg, SpO2 97%.  Medical Problem List and Plan: 1. Functional deficits secondary to L posterior cerebral and R frontal lobe   -patient may  shower  -ELOS/Goals:   2.  Antithrombotics: -DVT/anticoagulation:  Pharmaceutical: Eliquis  -antiplatelet therapy: Aspirin 81 mg daily  3. Pain Management: Tylenol as needed  4. Mood/Behavior/Sleep: LCSW to evaluate and provide emotional support  -antipsychotic agents: n/a  5. Neuropsych/cognition: This patient is? capable of making decisions on his own behalf.  6. Skin/Wound Care: Routine skin care checks   7. Fluids/Electrolytes/Nutrition: Routine Is and Os and follow-up chemistries  8: Hypertension: monitor TID and prn  -continue Entresto  -continue metoprolol 50 mg daily  -continue Pacerone 200 mg daily  9: Hyperlipidemia: continue statin, Zetia  10: Previous AVR on Eliquis  11: Acute left internal jugular and subclavian DVT on Eliquis 5 mg BID  12: DM-2: CBGs q AC and q HS; (home regimen includes NPH 12 units [place] daily with breakfast, Jardiance, semaglutide)  -continue Jardiance 10 mg daily  -continue SSI  -continue Levemir 8 units q HS   13: BPH: continue Flomax and finasteride  14: Chronic systolic/diastolic CHF: EF = 55-60%; not on duretic at home  -daily weight  -continue Pacerone 200 mg daily  -continue metoprolol succ 50 mg daily -Entresto held until orthostasis resolves  15: CKD stage IV: baseline ~2.5-3.0  -follow-up BMP  16: History of SVT: on BB  17: Orthostatic hypotension: monitor with increased mobility  -TED hose and abd binder  -Entresto held  18: History of CAD, CABG, stents on statin, BB       Milinda Antis, PA-C 02/18/2023  I have personally performed a face to face diagnostic evaluation of this patient and formulated the key components of the plan.  Additionally, I have personally reviewed  laboratory data, imaging studies, as well as relevant notes and concur with the physician assistant's documentation above.   The patient's status has not changed from the original H&P.  Any changes in documentation from the acute care chart have been noted above.

## 2023-02-18 NOTE — H&P (Signed)
Physical Medicine and Rehabilitation Admission H&P     CC: Functional deficits secondary to acute to subacute strokes   HPI: Shane Holloway si a 72 year old male who presented to the [facility] ED on /9/04 with dizziness and N/V. He had been hospitalized from 01/24/2023 through 01/27/2023 with acute superior left perirolandic cortex infarct. Also diagnosed with acute left internal jugular and subclavian DVT placed on eliquis and aspirin. New MRI showed acute to subacute strokes. Consulted Dr. Amada Jupiter from neurology as well as general surgery. Highly concerning for central embolic source.  He had both a transcranial Doppler as well as an echo with bubble which did not demonstrate any right-to-left shunt either cardiac or otherwise.  He did not have vascular imaging of the aorta or proximal great vessels because of his elevated creatinine, and Dr. Amada Jupiter ordered MRA without contrast of the head and neck. CT ab/pel showed dilated ileal small bowel segments in the right upper to mid abdomen with collapsed mid/lower small bowel consistent with intermediate to high-grade SBO; exact transition point was not found; etiology could be enteritis, occult adhesions or occult internal hernia. NGT placed. Etiology of stroke uncontrolled hypertension +/- suspected Afib or paradoxical emboli. 2D Echo (8/17): No intracardiac source of embolism detected. LDL 2. HgbA1c 8.8 VTE prophylaxis - IV Heparin drip, DVTs, holding home eliquis, per surgery. NGT removed 9/06 and started on clear liquids. VAS [place] Transcranial bubble doppler 8/17: negative. MRA of head and neck unremarkable. Transitioned from heparin to Eliquis 5 BID. 30 day monitor recommended. He tolerated exercises well but needs cues to initiate intermittently, inconsistently following single step commands. The patient requires inpatient medicine and rehabilitation evaluations and services for ongoing dysfunction secondary to acute to subacute strokes, vascular  dementia.   PMH significant for DVT on Eliquis which he had held for dental procedure PTA on 8/16. He is s/p AVR, has hx of vascular dementia, CHF, CAD, DM, hypertension, hyperlipidemia, BPH and CKD.   LBM yesterday Using urinal  No pain     Review of Systems  Constitutional:  Positive for malaise/fatigue.  HENT: Negative.    Eyes: Negative.   Respiratory: Negative.    Cardiovascular: Negative.   Gastrointestinal:  Negative for nausea and vomiting.  Genitourinary: Negative.   Musculoskeletal: Negative.   Skin: Negative.   Neurological:  Positive for dizziness and focal weakness. Negative for sensory change.  Endo/Heme/Allergies: Negative.   Psychiatric/Behavioral:         Cognitive impairment  All other systems reviewed and are negative.       Past Medical History:  Diagnosis Date   Acute on chronic combined systolic and diastolic CHF (congestive heart failure) (HCC) 11/24/2008    Qualifier: Diagnosis of   By: Johnnette Barrios, CNA, Sandy         Acute renal failure with acute tubular necrosis superimposed on stage 3b chronic kidney disease (HCC) 12/15/2021   Allergy     Aortic stenosis     Arthritis      hands   BPH (benign prostatic hyperplasia)     CHF (congestive heart failure) (HCC)     CKD (chronic kidney disease)     COVID-19     COVID-19 virus infection 12/16/2021   Diabetes mellitus     Dyspnea     DYSPNEA 11/24/2008    Qualifier: Diagnosis of   By: Johnnette Barrios, CNA, Sandy         Hyperlipidemia     Hypertension     Hypertensive urgency 12/10/2021  Myocardial infarction [facility]) 06/10/2002   NSTEMI (non-ST elevated myocardial infarction) (HCC) 12/10/2021   Stroke (HCC) 09/2020    Some memory deficits   Vertigo     Wears dentures      full upper             Past Surgical History:  Procedure Laterality Date   AORTIC VALVE REPLACEMENT (AVR)/CORONARY ARTERY BYPASS GRAFTING (CABG)   2015    [facility].  Redo of 2V CABG, Aortic Root repair and AVR   CATARACT EXTRACTION  W/PHACO Right 11/19/2021    Procedure: CATARACT EXTRACTION PHACO AND INTRAOCULAR LENS PLACEMENT (IOC) RIGHT DIABETIC;  Surgeon: Nevada Crane, MD;  Location: [facility];  Service: Ophthalmology;  Laterality: Right;  7.32 0:54.3   CATARACT EXTRACTION W/PHACO Left 12/03/2021    Procedure: CATARACT EXTRACTION PHACO AND INTRAOCULAR LENS PLACEMENT (IOC) LEFT DIABETIC;  Surgeon: Estanislado Pandy, MD;  Location: [facility];  Service: Ophthalmology;  Laterality: Left;  LEAVE FIRST CASE Diabetic 3.74 00:36.3   CORONARY ARTERY BYPASS GRAFT   2009    x4; stents to diagonal a mid right coronary lesion; drug eluting stent in the mid RCA as well as ostium of the diagonal with a cutting balloon angioplasty of the diagonal.   CORONARY STENT INTERVENTION N/A 12/14/2021    Procedure: CORONARY STENT INTERVENTION;  Surgeon: Iran Ouch, MD;  Location: [facility];  Service: Cardiovascular;  Laterality: N/A;   CORONARY ULTRASOUND/IVUS N/A 12/14/2021    Procedure: Intravascular Ultrasound/IVUS;  Surgeon: Iran Ouch, MD;  Location: [facility];  Service: Cardiovascular;  Laterality: N/A;   JOINT REPLACEMENT       RIGHT HEART CATH AND CORONARY/GRAFT ANGIOGRAPHY N/A 12/12/2021    Procedure: RIGHT HEART CATH AND CORONARY/GRAFT ANGIOGRAPHY;  Surgeon: Yvonne Kendall, MD;  Location: [facility];  Service: Cardiovascular;  Laterality: N/A;             Family History  Problem Relation Age of Onset   Heart disease Mother     Heart disease Father          Social History:  reports that he quit smoking about 29 years ago. His smoking use included cigarettes. He started smoking about 33 years ago. He has a 1 pack-year smoking history. He quit smokeless tobacco use about 29 years ago.  His smokeless tobacco use included chew. He reports that he does not drink alcohol and does not use drugs. Allergies:  Allergies      Allergies  Allergen Reactions   Altace  [Ramipril] Cough   Shellfish Allergy Nausea And Vomiting   Sulfa Antibiotics Hives            Medications Prior to Admission  Medication Sig Dispense Refill   amiodarone (PACERONE) 200 MG tablet Take 1 tablet by mouth daily.       apixaban (ELIQUIS) 5 MG TABS tablet Take 2 tablets (10 mg total) by mouth 2 (two) times daily for 6 days, THEN 1 tablet (5 mg total) 2 (two) times daily. 204 tablet 0   aspirin EC 81 MG tablet Take 81 mg by mouth daily.       atorvastatin (LIPITOR) 80 MG tablet Take 80 mg by mouth daily.       empagliflozin (JARDIANCE) 10 MG TABS tablet Take 1 tablet by mouth daily.       ENTRESTO 49-51 MG Take 1 tablet by mouth 2 (two) times daily.       ezetimibe (  ZETIA) 10 MG tablet Take 10 mg by mouth daily.       finasteride (PROSCAR) 5 MG tablet Take 5 mg by mouth daily.       insulin detemir (LEVEMIR) 100 UNIT/ML injection Inject 0.12 mLs (12 Units total) into the skin at bedtime.       Insulin NPH, Human,, Isophane, (HUMULIN N) 100 UNIT/ML Kiwkpen Inject 10 Units into the skin 2 (two) times daily.       metoprolol succinate (TOPROL-XL) 50 MG 24 hr tablet Take 50 mg by mouth daily.       nitroGLYCERIN (NITROSTAT) 0.4 MG SL tablet Place under the tongue.       Semaglutide, 1 MG/DOSE, (OZEMPIC, 1 MG/DOSE,) 4 MG/3ML SOPN Inject 1 mg into the skin once a week.       tamsulosin (FLOMAX) 0.4 MG CAPS capsule Take 0.4 mg by mouth daily.                  Home: Home Living Family/patient expects to be discharged to:: Private residence Living Arrangements: Alone Available Help at Discharge: Family, Friend(s), Available 24 hours/day Type of Home: Mobile home Home Access: Ramped entrance Home Layout: One level Bathroom Shower/Tub: Health visitor: Standard Bathroom Accessibility: Yes Home Equipment: Agricultural consultant (2 wheels), [employer] - single point, Wheelchair - manual, Information systems manager Additional Comments: States he went to rehab after d/c from hosptial last month  and recently returned home with HHPT. Has been ambulating without assistive device and is independent with ADLs.  Lives With: Alone   Functional History: Prior Function Prior Level of Function : Needs assist, History of Falls (last six months) Mobility Comments: Reports he has been with working with HHPT and has progressed to a level of independence at home where he no longer uses ambulatory equipment. ADLs Comments: Caretaking approx. 2x per week who assists with IADLs and transportation. Independent with ADLs.   Functional Status:  Mobility: Bed Mobility Overal bed mobility: Needs Assistance Bed Mobility: Supine to Sit, Sit to Supine Supine to sit: Used rails, Contact guard Sit to supine: Contact guard assist General bed mobility comments: Able to bring LEs out of and back into bed today with CGA. Cues to facilitate. Transfers Overall transfer level: Needs assistance Equipment used: Rolling walker (2 wheels) Transfers: Sit to/from Stand, Bed to chair/wheelchair/BSC Sit to Stand: Mod assist Bed to/from chair/wheelchair/BSC transfer type:: Step pivot Step pivot transfers: Mod assist General transfer comment: Deferred due to symptomatic orthostatic hypotension Ambulation/Gait Ambulation/Gait assistance: Min assist Gait Distance (Feet): 35 Feet (x2 trials with seated break in bathroom) Assistive device: Rolling walker (2 wheels) Gait Pattern/deviations: Decreased stance time - right, Decreased dorsiflexion - right, Decreased stride length, Step-through pattern, Ataxic, Shuffle, Trunk flexed, Wide base of support General Gait Details: Min assist for balance and RW control with turns. Intermittent cues for walker placement/proximity to device and upright stance. RLE foot drop with slightly improved step length/height with constant cues for sequencing/posture. Pt may benefit from RLE AFO pending progress next session; pt able to somewhat DF RLE while seated but poor DF ROM against gravity  during gait trial. Gait velocity: decr Gait velocity interpretation: <1.31 ft/sec, indicative of household ambulator Pre-gait activities: weight shift and march in place with RW support   ADL: ADL Overall ADL's : Needs assistance/impaired Eating/Feeding: Set up, Sitting Grooming: Set up, Sitting Upper Body Bathing: Minimal assistance, Cueing for safety, Cueing for sequencing, Sitting Upper Body Dressing : Minimal assistance, Sitting Toilet Transfer: Moderate assistance,  Cueing for safety, Cueing for sequencing, Rolling walker (2 wheels) Toilet Transfer Details (indicate cue type and reason): simulated during transfer from eob with pivotal steps to the recliner Toileting- Clothing Manipulation and Hygiene: Maximal assistance, Sit to/from stand Toileting - Clothing Manipulation Details (indicate cue type and reason): rn present during session and assisted pt. clean up of buttocks and hip area Functional mobility during ADLs: Minimal assistance, Moderate assistance, Cueing for sequencing, Rolling walker (2 wheels), Cueing for safety General ADL Comments: cues for sequencing and hand placement prior to sitting down, increased time for pt. to follow the one step commands for hand placement   Cognition: Cognition Overall Cognitive Status: No family/caregiver present to determine baseline cognitive functioning Orientation Level: Oriented to person, Oriented to place, Oriented to situation, Disoriented to time Cognition Arousal: Alert Behavior During Therapy: [facility] for tasks assessed/performed Overall Cognitive Status: No family/caregiver present to determine baseline cognitive functioning General Comments: following one step commands inconsistently- a little dealy in processing   Physical Exam: Blood pressure (!) 148/85, pulse 64, temperature 98.2 F (36.8 C), temperature source Oral, resp. rate 20, height 6\' 2"  (1.88 m), weight 100 kg, SpO2 97%. Physical Exam Vitals and nursing note  reviewed.  Constitutional:      General: He is not in acute distress.    Appearance: He is normal weight.     Comments: Sitting up in bed; but doesn't fit in bed well; NAD  HENT:     Head: Normocephalic and atraumatic.     Comments: No facial droop No tongue deviation    Right Ear: External ear normal.     Left Ear: External ear normal.     Nose: Nose normal. No congestion.     Mouth/Throat:     Mouth: Mucous membranes are dry.     Pharynx: Oropharynx is clear. No oropharyngeal exudate.  Eyes:     Extraocular Movements: Extraocular movements intact.     Comments: L nystagmus  Cardiovascular:     Rate and Rhythm: Normal rate and regular rhythm.     Heart sounds: Normal heart sounds. No murmur heard.    No gallop.  Pulmonary:     Effort: Pulmonary effort is normal. No respiratory distress.     Breath sounds: Normal breath sounds. No wheezing, rhonchi or rales.  Abdominal:     General: Bowel sounds are normal. There is no distension.     Palpations: Abdomen is soft.     Tenderness: There is no abdominal tenderness.  Musculoskeletal:     Cervical back: Neck supple. No tenderness.     Comments: UE 5/5 B/L LLE- HF 4+/5, otherwise 5/5 RLE- HF 4+/5, KE 4+/5; DF 4/5; PF 5-/5 EHL 2/5  Skin:    General: Skin is warm and dry.  Neurological:     Mental Status: He is alert.     Comments: Finger to nose slow and searching on both sides Off balance when sitting upright Needed a lot of cues to finish 1 step commands           Lab Results Last 48 Hours        Results for orders placed or performed during the hospital encounter of 02/12/23 (from the past 48 hour(s))  Glucose, capillary     Status: Abnormal    Collection Time: 02/16/23  4:48 PM  Result Value Ref Range    Glucose-Capillary 152 (H) 70 - 99 mg/dL      Comment: Glucose reference range applies only to samples  taken after fasting for at least 8 hours.    Comment 1 Notify RN      Comment 2 Document in Chart    Glucose,  capillary     Status: Abnormal    Collection Time: 02/16/23  9:24 PM  Result Value Ref Range    Glucose-Capillary 182 (H) 70 - 99 mg/dL      Comment: Glucose reference range applies only to samples taken after fasting for at least 8 hours.    Comment 1 Notify RN    Glucose, capillary     Status: Abnormal    Collection Time: 02/17/23  6:20 AM  Result Value Ref Range    Glucose-Capillary 141 (H) 70 - 99 mg/dL      Comment: Glucose reference range applies only to samples taken after fasting for at least 8 hours.    Comment 1 Notify RN    CBC     Status: Abnormal    Collection Time: 02/17/23  7:39 AM  Result Value Ref Range    WBC 5.8 4.0 - 10.5 K/uL    RBC 4.28 4.22 - 5.81 MIL/uL    Hemoglobin 12.5 (L) 13.0 - 17.0 g/dL    HCT 93.2 (L) 35.5 - 52.0 %    MCV 87.4 80.0 - 100.0 fL    MCH 29.2 26.0 - 34.0 pg    MCHC 33.4 30.0 - 36.0 g/dL    RDW 73.2 20.2 - 54.2 %    Platelets 115 (L) 150 - 400 K/uL      Comment: REPEATED TO VERIFY    nRBC 0.0 0.0 - 0.2 %      Comment: Performed at [facility], [address] [place], [place] [address]  Basic metabolic panel     Status: Abnormal    Collection Time: 02/17/23  7:39 AM  Result Value Ref Range    Sodium 135 135 - 145 mmol/L    Potassium 3.3 (L) 3.5 - 5.1 mmol/L    Chloride 104 98 - 111 mmol/L    CO2 23 22 - 32 mmol/L    Glucose, Bld 164 (H) 70 - 99 mg/dL      Comment: Glucose reference range applies only to samples taken after fasting for at least 8 hours.    BUN 33 (H) 8 - 23 mg/dL    Creatinine, Ser 7.62 (H) 0.61 - 1.24 mg/dL    Calcium 8.4 (L) 8.9 - 10.3 mg/dL    GFR, Estimated 23 (L) >60 mL/min      Comment: (NOTE) Calculated using the CKD-EPI Creatinine Equation (2021)      Anion gap 8 5 - 15      Comment: Performed at [facility], [address], [place], [place] [address]  Glucose, capillary     Status: Abnormal    Collection Time: 02/17/23 11:45 AM  Result Value Ref Range    Glucose-Capillary 219 (H) 70 - 99  mg/dL      Comment: Glucose reference range applies only to samples taken after fasting for at least 8 hours.  Glucose, capillary     Status: Abnormal    Collection Time: 02/17/23  5:08 PM  Result Value Ref Range    Glucose-Capillary 188 (H) 70 - 99 mg/dL      Comment: Glucose reference range applies only to samples taken after fasting for at least 8 hours.  Glucose, capillary     Status: Abnormal    Collection Time: 02/17/23  9:26 PM  Result Value Ref Range    Glucose-Capillary 184 (H) 70 - 99 mg/dL      Comment: Glucose reference range applies only to samples taken after fasting for at least 8 hours.  CBC     Status: Abnormal    Collection Time: 02/18/23  5:46 AM  Result Value Ref Range    WBC 6.4 4.0 - 10.5 K/uL    RBC 4.36 4.22 - 5.81 MIL/uL    Hemoglobin 12.3 (L) 13.0 - 17.0 g/dL    HCT 69.6 (L) 29.5 - 52.0 %    MCV 86.2 80.0 - 100.0 fL    MCH 28.2 26.0 - 34.0 pg    MCHC 32.7 30.0 - 36.0 g/dL    RDW 28.4 13.2 - 44.0 %    Platelets 119 (L) 150 - 400 K/uL      Comment: CONSISTENT WITH PREVIOUS RESULT REPEATED TO VERIFY      nRBC 0.0 0.0 - 0.2 %      Comment: Performed at [facility], [address] [place], [place] [address]  Basic metabolic panel     Status: Abnormal    Collection Time: 02/18/23  5:46 AM  Result Value Ref Range    Sodium 137 135 - 145 mmol/L    Potassium 3.7 3.5 - 5.1 mmol/L    Chloride 106 98 - 111 mmol/L    CO2 23 22 - 32 mmol/L    Glucose, Bld 150 (H) 70 - 99 mg/dL      Comment: Glucose reference range applies only to samples taken after fasting for at least 8 hours.    BUN 37 (H) 8 - 23 mg/dL    Creatinine, Ser 5.36 (H) 0.61 - 1.24 mg/dL    Calcium 8.4 (L) 8.9 - 10.3 mg/dL    GFR, Estimated 21 (L) >60 mL/min      Comment: (NOTE) Calculated using the CKD-EPI Creatinine Equation (2021)      Anion gap 8 5 - 15      Comment: Performed at [facility], [address], [place], [place] [address]  Glucose, capillary     Status: Abnormal     Collection Time: 02/18/23  6:34 AM  Result Value Ref Range    Glucose-Capillary 128 (H) 70 - 99 mg/dL      Comment: Glucose reference range applies only to samples taken after fasting for at least 8 hours.      Imaging Results (Last 48 hours)  No results found.         Blood pressure (!) 148/85, pulse 64, temperature 98.2 F (36.8 C), temperature source Oral, resp. rate 20, height 6\' 2"  (1.88 m), weight 100 kg, SpO2 97%.   Medical Problem List and Plan: 1. Functional deficits secondary to L posterior cerebral and R frontal lobe              -patient may  shower             -ELOS/Goals:    2.  Antithrombotics: -DVT/anticoagulation:  Pharmaceutical: Eliquis             -antiplatelet therapy: Aspirin 81 mg daily   3. Pain Management: Tylenol as needed   4. Mood/Behavior/Sleep: LCSW to evaluate and provide emotional support             -antipsychotic agents: n/a   5. Neuropsych/cognition: This patient is? capable of making decisions on his own behalf.   6. Skin/Wound Care: Routine skin care checks  7. Fluids/Electrolytes/Nutrition: Routine Is and Os and follow-up chemistries   8: Hypertension: monitor TID and prn             -continue Entresto             -continue metoprolol 50 mg daily             -continue Pacerone 200 mg daily   9: Hyperlipidemia: continue statin, Zetia   10: Previous AVR on Eliquis   11: Acute left internal jugular and subclavian DVT on Eliquis 5 mg BID   12: DM-2: CBGs q AC and q HS; (home regimen includes NPH 12 units [place] daily with breakfast, Jardiance, semaglutide)             -continue Jardiance 10 mg daily             -continue SSI             -continue Levemir 8 units q HS              13: BPH: continue Flomax and finasteride   14: Chronic systolic/diastolic CHF: EF = 55-60%; not on duretic at home             -daily weight             -continue Pacerone 200 mg daily             -continue metoprolol succ 50 mg daily -Entresto held  until orthostasis resolves   15: CKD stage IV: baseline ~2.5-3.0             -follow-up BMP   16: History of SVT: on BB   17: Orthostatic hypotension: monitor with increased mobility             -TED hose and abd binder             -Entresto held   18: History of CAD, CABG, stents on statin, BB   19. Small bowel obstruction- has resolved, but at risk           Milinda Antis, PA-C 02/18/2023   I have personally performed a face to face diagnostic evaluation of this patient and formulated the key components of the plan.  Additionally, I have personally reviewed laboratory data, imaging studies, as well as relevant notes and concur with the physician assistant's documentation above.   The patient's status has not changed from the original H&P.  Any changes in documentation from the acute care chart have been noted above.

## 2023-02-18 NOTE — Progress Notes (Signed)
Inpatient Rehabilitation Admission Medication Review by a Pharmacist  A complete drug regimen review was completed for this patient to identify any potential clinically significant medication issues.  High Risk Drug Classes Is patient taking? Indication by Medication  Antipsychotic {Receiving?:26196}   Anticoagulant {Receiving?:26196} Apixaban- h/o DVT, CVA   Antibiotic {Receiving?:26196}   Opioid {Receiving?:26196}   Antiplatelet {Receiving?:26196}   Hypoglycemics/insulin {Yes or No?:26198} Jardiance, insulin- DM   Vasoactive Medication {Receiving?:26196} Toprol- HTN, AF   Chemotherapy {Receiving Chemo?:26197}   Other {Yes or No?:26198} Lipitor, ezetimibe- HLD  Finasteride- retention      Type of Medication Issue Identified Description of Issue Recommendation(s)  Drug Interaction(s) (clinically significant)     Duplicate Therapy     Allergy     No Medication Administration End Date     Incorrect Dose     Additional Drug Therapy Needed     Significant med changes from prior encounter (inform family/care partners about these prior to discharge).    Other  PTA: amiodarone, entresto, semaglutide, tamsulosin      Clinically significant medication issues were identified that warrant physician communication and completion of prescribed/recommended actions by midnight of the next day:  {Yes or No?:26198}  Name of provider notified for urgent issues identified:   Provider Method of Notification:     Pharmacist comments:   Time spent performing this drug regimen review (minutes):  ***   Jani Gravel, PharmD Clinical Pharmacist  02/18/2023 3:16 PM

## 2023-02-18 NOTE — Progress Notes (Signed)
Report called to RN. All questions answered. Pt's room is still dirty - RN will message 3W bedside RN when room is ready to receive pt.

## 2023-02-18 NOTE — TOC Transition Note (Signed)
Transition of Care [facility]) - CM/SW Discharge Note   Patient Details  Name: Shane Holloway MRN: [id number] Date of Birth: 05-15-1951  [facility] of [facility]) CM/SW Contact:  Kermit Balo, RN Phone Number: 02/18/2023, 10:46 AM   Clinical Narrative:     Patient is discharging to CIR today. CM has left voicemails for nephews to update.  TOC following.  Final next level of care: IP Rehab Facility Barriers to Discharge: No Barriers Identified   Patient Goals and CMS Choice CMS Medicare.gov Compare Post Acute Care list provided to:: Patient Choice offered to / list presented to : Patient  Discharge Placement                         Discharge Plan and Services Additional resources added to the After Visit Summary for                                       Social Determinants of Health (SDOH) Interventions SDOH Screenings   Food Insecurity: No Food Insecurity (02/13/2023)  Housing: Low Risk  (02/13/2023)  Transportation Needs: No Transportation Needs (02/13/2023)  Utilities: Not At Risk (02/13/2023)  Depression (PHQ2-9): High Risk (05/20/2022)  Financial Resource Strain: Low Risk  (11/22/2022)   Received from [facility], [facility]  Social Connections: Unknown (09/01/2020)   Received from [facility], [facility]  Tobacco Use: Medium Risk (02/12/2023)  Health Literacy: High Risk (03/28/2021)   Received from [facility], [facility]     Readmission Risk Interventions     No data to display

## 2023-02-18 NOTE — Progress Notes (Signed)
Inpatient Rehab Admissions Coordinator:    Pt. To admit to CIR today. RN may call report to [phone].  Pt. To admit to CIR for an estimated 7-10 days with the goal of discharging home with niece, nephew, and friend for support.   Megan Salon, MS, CCC-SLP Rehab Admissions Coordinator  [phone] (celll) [phone] (office)

## 2023-02-19 DIAGNOSIS — I63532 Cerebral infarction due to unspecified occlusion or stenosis of left posterior cerebral artery: Secondary | ICD-10-CM | POA: Diagnosis not present

## 2023-02-19 LAB — CBC WITH DIFFERENTIAL/PLATELET
Abs Immature Granulocytes: 0.04 10*3/uL (ref 0.00–0.07)
Basophils Absolute: 0 10*3/uL (ref 0.0–0.1)
Basophils Relative: 1 %
Eosinophils Absolute: 0.3 10*3/uL (ref 0.0–0.5)
Eosinophils Relative: 5 %
HCT: 40.4 % (ref 39.0–52.0)
Hemoglobin: 13.5 g/dL (ref 13.0–17.0)
Immature Granulocytes: 1 %
Lymphocytes Relative: 20 %
Lymphs Abs: 1.4 10*3/uL (ref 0.7–4.0)
MCH: 29.1 pg (ref 26.0–34.0)
MCHC: 33.4 g/dL (ref 30.0–36.0)
MCV: 87.1 fL (ref 80.0–100.0)
Monocytes Absolute: 0.8 10*3/uL (ref 0.1–1.0)
Monocytes Relative: 11 %
Neutro Abs: 4.6 10*3/uL (ref 1.7–7.7)
Neutrophils Relative %: 62 %
Platelets: 134 10*3/uL — ABNORMAL LOW (ref 150–400)
RBC: 4.64 MIL/uL (ref 4.22–5.81)
RDW: 13.6 % (ref 11.5–15.5)
WBC: 7.1 10*3/uL (ref 4.0–10.5)
nRBC: 0 % (ref 0.0–0.2)

## 2023-02-19 LAB — GLUCOSE, CAPILLARY
Glucose-Capillary: 156 mg/dL — ABNORMAL HIGH (ref 70–99)
Glucose-Capillary: 145 mg/dL — ABNORMAL HIGH (ref 70–99)
Glucose-Capillary: 225 mg/dL — ABNORMAL HIGH (ref 70–99)
Glucose-Capillary: 164 mg/dL — ABNORMAL HIGH (ref 70–99)

## 2023-02-19 LAB — COMPREHENSIVE METABOLIC PANEL
ALT: 16 U/L (ref 0–44)
AST: 13 U/L — ABNORMAL LOW (ref 15–41)
Albumin: 3.3 g/dL — ABNORMAL LOW (ref 3.5–5.0)
Alkaline Phosphatase: 92 U/L (ref 38–126)
Anion gap: 11 (ref 5–15)
BUN: 37 mg/dL — ABNORMAL HIGH (ref 8–23)
CO2: 21 mmol/L — ABNORMAL LOW (ref 22–32)
Calcium: 9.1 mg/dL (ref 8.9–10.3)
Chloride: 106 mmol/L (ref 98–111)
Creatinine, Ser: 2.57 mg/dL — ABNORMAL HIGH (ref 0.61–1.24)
GFR, Estimated: 25 mL/min — ABNORMAL LOW (ref >60)
Glucose, Bld: 127 mg/dL — ABNORMAL HIGH (ref 70–99)
Potassium: 3.8 mmol/L (ref 3.5–5.1)
Sodium: 138 mmol/L (ref 135–145)
Total Bilirubin: 1.1 mg/dL (ref 0.3–1.2)
Total Protein: 6.2 g/dL — ABNORMAL LOW (ref 6.5–8.1)

## 2023-02-19 LAB — MAGNESIUM: Magnesium: 2.1 mg/dL (ref 1.7–2.4)

## 2023-02-19 LAB — VITAMIN D 25 HYDROXY (VIT D DEFICIENCY, FRACTURES): Vit D, 25-Hydroxy: 74.64 ng/mL (ref 30–100)

## 2023-02-19 NOTE — Progress Notes (Signed)
   02/19/23 1011  Spiritual Encounters  Type of Visit Initial  Care provided to: Patient  Referral source Physician  Reason for visit Advance directives  OnCall Visit No  Advance Directives (For Healthcare)  Does Patient Have a Medical Advance Directive? Yes  Does patient want to make changes to medical advance directive? Yes (Inpatient - patient defers changing a medical advance directive and declines information at this time)   Patient stated that he has already completed AD paperwork and does not want to change them.   Arlyce Dice, Chaplain Resident [phone]

## 2023-02-19 NOTE — Evaluation (Signed)
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)   Past Medical History:  Past Medical History:  Diagnosis Date   Acute on chronic combined systolic and diastolic CHF (congestive heart failure) (HCC) 11/24/2008   Qualifier: Diagnosis of   By: Johnnette Barrios, CNA, Sandy         Acute renal failure with acute tubular necrosis superimposed on stage 3b chronic kidney disease (HCC) 12/15/2021   Allergy    Aortic stenosis    Arthritis    hands   BPH (benign prostatic hyperplasia)    CHF (congestive heart failure) (HCC)    CKD (chronic kidney disease)    COVID-19    COVID-19 virus infection 12/16/2021   Diabetes mellitus    Dyspnea    DYSPNEA 11/24/2008   Qualifier: Diagnosis of   By: Johnnette Barrios, CNA, Sandy         Hyperlipidemia    Hypertension    Hypertensive urgency 12/10/2021   Myocardial infarction [facility]) 06/10/2002   NSTEMI (non-ST elevated myocardial infarction) (HCC) 12/10/2021   Stroke (HCC) 09/2020   Some memory deficits   Vertigo    Wears dentures    full upper   Past Surgical History:  Past Surgical History:  Procedure Laterality Date   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  Redo of 2V CABG, Aortic Root repair and AVR   CATARACT EXTRACTION W/PHACO Right 11/19/2021   Procedure: CATARACT EXTRACTION PHACO AND INTRAOCULAR LENS PLACEMENT (IOC) RIGHT DIABETIC;  Surgeon: Nevada Crane, MD;  Location: [facility];  Service: Ophthalmology;  Laterality: Right;  7.32 0:54.3   CATARACT EXTRACTION W/PHACO  Left 12/03/2021   Procedure: CATARACT EXTRACTION PHACO AND INTRAOCULAR LENS PLACEMENT (IOC) LEFT DIABETIC;  Surgeon: Estanislado Pandy, MD;  Location: [facility];  Service: Ophthalmology;  Laterality: Left;  LEAVE FIRST CASE Diabetic 3.74 00:36.3   CORONARY ARTERY BYPASS GRAFT  2009   x4; stents to diagonal a mid right coronary lesion; drug eluting stent in the mid RCA as well as ostium of the diagonal with a cutting balloon angioplasty of the diagonal.   CORONARY STENT INTERVENTION N/A 12/14/2021   Procedure: CORONARY STENT INTERVENTION;  Surgeon: Iran Ouch, MD;  Location: [facility] INVASIVE CV LAB;  Service: Cardiovascular;  Laterality: N/A;   CORONARY ULTRASOUND/IVUS N/A 12/14/2021   Procedure: Intravascular Ultrasound/IVUS;  Surgeon: Iran Ouch, MD;  Location: [facility] INVASIVE CV LAB;  Service: Cardiovascular;  Laterality: N/A;   JOINT REPLACEMENT     RIGHT HEART CATH AND CORONARY/GRAFT ANGIOGRAPHY N/A 12/12/2021   Procedure: RIGHT HEART CATH AND CORONARY/GRAFT ANGIOGRAPHY;  Surgeon: Yvonne Kendall, MD;  Location: [facility] INVASIVE CV LAB;  Service: Cardiovascular;  Laterality: N/A;    Assessment & Plan Clinical Impression:   Shane Holloway si a 72 year old male who presented to the [facility] ED on /9/04 with dizziness and N/V. He had been hospitalized from 01/24/2023 through 01/27/2023 with acute superior left perirolandic cortex infarct. Also diagnosed with acute left internal jugular and subclavian DVT placed on eliquis and aspirin. New MRI showed acute to subacute strokes. Consulted Dr.  Kirkpatrick from neurology as well as general surgery. Highly concerning for central embolic source.  He had both a transcranial Doppler as well as an echo with bubble which did not demonstrate any right-to-left shunt either cardiac or otherwise.  He did not have vascular imaging of the aorta or proximal great vessels because of his elevated creatinine, and Dr. Amada Jupiter ordered MRA without contrast  of the head and neck. CT ab/pel showed dilated ileal small bowel segments in the right upper to mid abdomen with collapsed mid/lower small bowel consistent with intermediate to high-grade SBO; exact transition point was not found; etiology could be enteritis, occult adhesions or occult internal hernia. NGT placed. Etiology of stroke uncontrolled hypertension +/- suspected Afib or paradoxical emboli. 2D Echo (8/17): No intracardiac source of embolism detected. LDL 2. HgbA1c 8.8 VTE prophylaxis - IV Heparin drip, DVTs, holding home eliquis, per surgery. NGT removed 9/06 and started on clear liquids. VAS [place] Transcranial bubble doppler 8/17: negative. MRA of head and neck unremarkable. Transitioned from heparin to Eliquis 5 BID. 30 day monitor recommended. He tolerated exercises well but needs cues to initiate intermittently, inconsistently following single step commands. The patient requires inpatient medicine and rehabilitation evaluations and services for ongoing dysfunction secondary to acute to subacute strokes, vascular dementia.   PMH significant for DVT on Eliquis which he had held for dental procedure PTA on 8/16. He is s/p AVR, has hx of vascular dementia, CHF, CAD, DM, hypertension, hyperlipidemia, BPH and CKD. Patient transferred to CIR on 02/18/2023 .    Patient currently requires min with basic self-care skills secondary to muscle weakness, unbalanced muscle activation, decreased memory and delayed processing, and decreased standing balance, decreased postural control, hemiplegia, and decreased balance strategies.  Prior to hospitalization, patient was fully independent with basic self care but needed A with medication management.  Patient will benefit from skilled intervention to increase independence with basic self-care skills prior to discharge home with care partner.  Anticipate patient will require 24 hour supervision and follow up home health.  OT - End of Session Activity Tolerance:  Tolerates 10 - 20 min activity with multiple rests OT Assessment Rehab Potential (ACUTE ONLY): Good OT Barriers to Discharge: Decreased caregiver support OT Barriers to Discharge Comments: due to memory impairments, pt will most likely need 24/7 supervision OT Patient demonstrates impairments in the following area(s): Balance;Cognition;Endurance;Motor OT Basic ADL's Functional Problem(s): Bathing;Dressing;Toileting OT Advanced ADL's Functional Problem(s): Simple Meal Preparation OT Transfers Functional Problem(s): Toilet;Tub/Shower OT Additional Impairment(s): None OT Plan OT Intensity: Minimum of 1-2 x/day, 45 to 90 minutes OT Frequency: 5 out of 7 days OT Duration/Estimated Length of Stay: 7-10 days OT Treatment/Interventions: Social worker;Discharge planning;Cognitive remediation/compensation;Functional mobility training;Neuromuscular re-education;Psychosocial support;Patient/family education;Self Care/advanced ADL retraining;UE/LE Strength taining/ROM;Therapeutic Exercise;Therapeutic Activities OT Self Feeding Anticipated Outcome(s): no goal, pt is independent OT Basic Self-Care Anticipated Outcome(s): Mod I OT Toileting Anticipated Outcome(s): Mod I OT Bathroom Transfers Anticipated Outcome(s): Mod I to toilet, supervision to shower OT Recommendation Patient destination: Home Follow Up Recommendations: Home health OT Equipment Recommended: None recommended by OT   OT Evaluation Precautions/Restrictions  Precautions Precautions: Fall Restrictions Weight Bearing Restrictions: No General   Vital Signs Therapy Vitals Temp: (!) 97.5 F (36.4 C) Temp Source: Oral Pulse Rate: 62 Resp: 18 BP: 135/81 Patient Position (if appropriate): Lying Oxygen Therapy SpO2: 99 % O2 Device: Room Air Pain Pain Assessment Pain Scale: 0-10 Pain Score: 0-No pain Home Living/Prior Functioning Home Living Family/patient expects to be  discharged to:: Private  residence Living Arrangements: Alone Available Help at Discharge: Family, Friend(s), Available 24 hours/day (nephew lives next door) Type of Home: Mobile home (double wide) Home Access: Ramped entrance Home Layout: One level Bathroom Shower/Tub: Health visitor: Standard Bathroom Accessibility: Yes Additional Comments: States he went to rehab after d/c from hosptial last month and recently returned home with HHPT. Has been ambulating without assistive device and is independent with ADLs.  Lives With: Alone Prior Function Level of Independence: Independent with basic ADLs, Independent with gait, Independent with transfers  Able to Take Stairs?: Yes Driving: No Vocation: Retired Leisure: Hobbies-yes (Comment) (play's guitar) Vision Baseline Vision/History: 1 Wears glasses ("i am supposed to wear glasses but I dont bc they do not help") Ability to See in Adequate Light: 1 Impaired Patient Visual Report: No change from baseline Vision Assessment?: Yes Eye Alignment: Within Functional Limits Ocular Range of Motion: Within Functional Limits Alignment/Gaze Preference: Within Defined Limits Tracking/Visual Pursuits: Decreased smoothness of horizontal tracking;Decreased smoothness of vertical tracking Saccades: Decreased speed of saccadic movement Convergence: Impaired (comment) Visual Fields: Other (comment) (after 2 trials, pt kept stating that he did not see visual target in either eye until 45 degrees of visual field) Perception  Perception: Within Functional Limits Praxis Praxis: WFL Cognition Cognition Overall Cognitive Status: History of cognitive impairments - at baseline Arousal/Alertness: Awake/alert Orientation Level: Person;Place;Situation Person: Oriented Place: Oriented Situation: Oriented Memory: Impaired Memory Impairment: Retrieval deficit;Decreased recall of new information;Decreased short term memory;Storage deficit Decreased  Short Term Memory: Functional basic Attention: Selective Selective Attention: Impaired Selective Attention Impairment: Verbal basic;Functional basic Awareness: Appears intact Problem Solving: Impaired Problem Solving Impairment: Verbal basic;Functional basic Executive Function: Decision Making;Initiating Decision Making: Impaired Decision Making Impairment: Verbal basic;Functional basic Initiating: Impaired Initiating Impairment: Verbal basic;Functional basic Safety/Judgment: Impaired Brief Interview for Mental Status (BIMS) Repetition of Three Words (First Attempt): 3 Temporal Orientation: Year: Correct (first answered 1924 then corrected self) Temporal Orientation: Month: Accurate within 5 days Temporal Orientation: Day: Correct Recall: "Sock": No, could not recall Recall: "Blue": No, could not recall Recall: "Bed": Yes, no cue required BIMS Summary Score: 11 Sensation Sensation Light Touch: Impaired by gross assessment Light Touch Impaired Details: Impaired RLE Hot/Cold: Appears Intact Proprioception: Appears Intact Stereognosis: Not tested Coordination Gross Motor Movements are Fluid and Coordinated: No Fine Motor Movements are Fluid and Coordinated: Yes Finger Nose Finger Test: [facility] Motor  Motor Motor - Skilled Clinical Observations: RLE weakness  Trunk/Postural Assessment  Postural Control Postural Control: Deficits on evaluation Trunk Control: slight L lean in sitting and standing  Balance Dynamic Sitting Balance Dynamic Sitting - Level of Assistance: 5: Stand by assistance Static Standing Balance Static Standing - Level of Assistance: 5: Stand by assistance Dynamic Standing Balance Dynamic Standing - Level of Assistance: 4: Min assist Extremity/Trunk Assessment RUE Assessment RUE Assessment: Within Functional Limits Active Range of Motion (AROM) Comments: limited to 160 degrees of flexion (premorbid) LUE Assessment LUE Assessment: Within Functional  Limits Active Range of Motion (AROM) Comments: limited to 160 degrees of flexion (premorbid)  Care Tool Care Tool Self Care Eating        Oral Care         Bathing              Upper Body Dressing(including orthotics)            Lower Body Dressing (excluding footwear)          Putting on/Taking off footwear  Care Tool Toileting Toileting activity         Care Tool Bed Mobility Roll left and right activity        Sit to lying activity        Lying to sitting on side of bed activity         Care Tool Transfers Sit to stand transfer        Chair/bed transfer         Toilet transfer         Care Tool Cognition  Expression of Ideas and Wants Expression of Ideas and Wants: 3. Some difficulty - exhibits some difficulty with expressing needs and ideas (e.g, some words or finishing thoughts) or speech is not clear  Understanding Verbal and Non-Verbal Content Understanding Verbal and Non-Verbal Content: 3. Usually understands - understands most conversations, but misses some part/intent of message. Requires cues at times to understand   Memory/Recall Ability Memory/Recall Ability : That he or she is in a hospital/hospital unit   Refer to Care Plan for Long Term Goals  SHORT TERM GOAL WEEK 1 OT Short Term Goal 1 (Week 1): STGs = LTGs  Recommendations for other services: None    Skilled Therapeutic Intervention ADL ADL Eating: Independent Grooming: Setup Upper Body Bathing: Setup Where Assessed-Upper Body Bathing: Shower Lower Body Bathing: Supervision/safety Where Assessed-Lower Body Bathing: Shower Upper Body Dressing: Setup Lower Body Dressing: Minimal assistance Toileting: Contact guard Toilet Transfer: Minimal assistance Toilet Transfer Method: Proofreader: Engineer, manufacturing systems Transfer: Minimal assistance Film/video editor Method: Designer, industrial/product: Grab bars;Transfer  tub bench    Pt seen for initial evaluation and ADL training with a focus on safe mobility and balance with ADLs. Pt completed toileting, shower and dressing.  See ADL documentation. Good participation today.  Reviewed role of OT, discussed POC and pt's goals, and ELOS. Pt resting in recliner with all needs met and alarm set.     Discharge Criteria: Patient will be discharged from OT if patient refuses treatment 3 consecutive times without medical reason, if treatment goals not met, if there is a change in medical status, if patient makes no progress towards goals or if patient is discharged from hospital.  The above assessment, treatment plan, treatment alternatives and goals were discussed and mutually agreed upon: by patient  [facility] 02/19/2023, 1:11 PM

## 2023-02-19 NOTE — Progress Notes (Signed)
Inpatient Rehabilitation  Patient information reviewed and entered into eRehab system by Melissa M. Bowie, M.A., CCC/SLP, PPS Coordinator.  Information including medical coding, functional ability and quality indicators will be reviewed and updated through discharge.    

## 2023-02-19 NOTE — Progress Notes (Signed)
Patient ID: Shane Holloway, male   DOB: 1951/05/03, 72 y.o.   MRN: [id number] Met with the patient to review current situation, rehab process, team conference and plan of care. Patient noted his nephew lived next door and assisted with meals PTA. Reviewed secondary risks including HTN, HLD on lipitor and zetia with DM (A1C 8.8); patient administered levemir PTA, HF and CKD. Reviewed HH/CMM diet; patient reports family and neighbors assist with meals. Has DME and ramped entry to home. Continue to follow along to address educational needs to facilitate preparation for discharge. Pamelia Hoit

## 2023-02-19 NOTE — Evaluation (Signed)
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)   Past Medical History:  Past Medical History:  Diagnosis Date   Acute on chronic combined systolic and diastolic CHF (congestive heart failure) (HCC) 11/24/2008   Qualifier: Diagnosis of   By: Johnnette Barrios, CNA, Sandy         Acute renal failure with acute tubular necrosis superimposed on stage 3b chronic kidney disease (HCC) 12/15/2021   Allergy    Aortic stenosis    Arthritis    hands   BPH (benign prostatic hyperplasia)    CHF (congestive heart failure) (HCC)    CKD (chronic kidney disease)    COVID-19    COVID-19 virus infection 12/16/2021   Diabetes mellitus    Dyspnea    DYSPNEA 11/24/2008   Qualifier: Diagnosis of   By: Johnnette Barrios, CNA, Sandy         Hyperlipidemia    Hypertension    Hypertensive urgency 12/10/2021   Myocardial infarction [facility]) 06/10/2002   NSTEMI (non-ST elevated myocardial infarction) (HCC) 12/10/2021   Stroke (HCC) 09/2020   Some memory deficits   Vertigo    Wears dentures    full upper   Past Surgical History:  Past Surgical History:  Procedure Laterality Date   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  Redo of 2V CABG, Aortic Root repair and AVR   CATARACT EXTRACTION W/PHACO Right 11/19/2021   Procedure: CATARACT EXTRACTION PHACO AND INTRAOCULAR LENS PLACEMENT (IOC) RIGHT DIABETIC;  Surgeon: Nevada Crane, MD;  Location: [facility];  Service: Ophthalmology;  Laterality: Right;  7.32 0:54.3   CATARACT EXTRACTION W/PHACO Left 12/03/2021    Procedure: CATARACT EXTRACTION PHACO AND INTRAOCULAR LENS PLACEMENT (IOC) LEFT DIABETIC;  Surgeon: Estanislado Pandy, MD;  Location: [facility];  Service: Ophthalmology;  Laterality: Left;  LEAVE FIRST CASE Diabetic 3.74 00:36.3   CORONARY ARTERY BYPASS GRAFT  2009   x4; stents to diagonal a mid right coronary lesion; drug eluting stent in the mid RCA as well as ostium of the diagonal with a cutting balloon angioplasty of the diagonal.   CORONARY STENT INTERVENTION N/A 12/14/2021   Procedure: CORONARY STENT INTERVENTION;  Surgeon: Iran Ouch, MD;  Location: [facility] INVASIVE CV LAB;  Service: Cardiovascular;  Laterality: N/A;   CORONARY ULTRASOUND/IVUS N/A 12/14/2021   Procedure: Intravascular Ultrasound/IVUS;  Surgeon: Iran Ouch, MD;  Location: [facility] INVASIVE CV LAB;  Service: Cardiovascular;  Laterality: N/A;   JOINT REPLACEMENT     RIGHT HEART CATH AND CORONARY/GRAFT ANGIOGRAPHY N/A 12/12/2021   Procedure: RIGHT HEART CATH AND CORONARY/GRAFT ANGIOGRAPHY;  Surgeon: Yvonne Kendall, MD;  Location: [facility] INVASIVE CV LAB;  Service: Cardiovascular;  Laterality: N/A;    Assessment & Plan Clinical Impression: Patient is a 72 year old male who presented to [facility] ED on /9/04 with dizziness and N/V. He had been hospitalized from 01/24/2023 through 01/27/2023 with acute superior left perirolandic cortex infarct. Also diagnosed with acute left internal jugular and subclavian DVT placed on eliquis and aspirin. New MRI showed acute to subacute strokes. Consulted Dr. Amada Jupiter from neurology as well  as general surgery. Highly concerning for central embolic source.  He had both a transcranial Doppler as well as an echo with bubble which did not demonstrate any right-to-left shunt either cardiac or otherwise.  He did not have vascular imaging of the aorta or proximal great vessels because of his elevated creatinine, and Dr. Amada Jupiter ordered MRA without contrast of the head and neck. CT  ab/pel showed dilated ileal small bowel segments in the right upper to mid abdomen with collapsed mid/lower small bowel consistent with intermediate to high-grade SBO; exact transition point was not found; etiology could be enteritis, occult adhesions or occult internal hernia. NGT placed. Etiology of stroke uncontrolled hypertension +/- suspected Afib or paradoxical emboli. 2D Echo (8/17): No intracardiac source of embolism detected. LDL 2. HgbA1c 8.8 VTE prophylaxis - IV Heparin drip, DVTs, holding home eliquis, per surgery. NGT removed 9/06 and started on clear liquids. VAS [place] Transcranial bubble doppler 8/17: negative. MRA of head and neck unremarkable. Transitioned from heparin to Eliquis 5 BID. 30 day monitor recommended. He tolerated exercises well but needs cues to initiate intermittently, inconsistently following single step commands. The patient requires inpatient medicine and rehabilitation evaluations and services for ongoing dysfunction secondary to acute to subacute strokes, vascular dementia. Patient transferred to CIR on 02/18/2023 .   Patient currently requires min with mobility secondary to muscle weakness, decreased cardiorespiratoy endurance, decreased initiation, decreased attention, decreased awareness, decreased problem solving, decreased safety awareness, decreased memory, and delayed processing, and decreased sitting balance, decreased standing balance, and decreased balance strategies.  Prior to hospitalization, patient was modified independent  with mobility and lived with Alone in a Mobile home (double wide) home.  Home access is  Ramped entrance.  Patient will benefit from skilled PT intervention to maximize safe functional mobility, minimize fall risk, and decrease caregiver burden for planned discharge home with 24 hour supervision.  Anticipate patient will benefit from follow up HH at discharge.  PT - End of Session Activity Tolerance: Tolerates < 10 min activity, no significant  change in vital signs Endurance Deficit: Yes PT Assessment Rehab Potential (ACUTE/IP ONLY): Good PT Barriers to Discharge: Decreased caregiver support;Home environment access/layout;Insurance for SNF coverage PT Patient demonstrates impairments in the following area(s): Balance;Endurance;Safety PT Transfers Functional Problem(s): Bed Mobility;Bed to Chair PT Locomotion Functional Problem(s): Ambulation;Stairs PT Plan PT Intensity: Minimum of 1-2 x/day ,45 to 90 minutes PT Frequency: 5 out of 7 days PT Duration Estimated Length of Stay: 7-9 days PT Treatment/Interventions: Ambulation/gait training;Balance/vestibular training;Cognitive remediation/compensation;Community reintegration;Discharge planning;Disease management/prevention;DME/adaptive equipment instruction;Functional electrical stimulation;Functional mobility training;Neuromuscular re-education;Pain management;Patient/family education;Psychosocial support;Skin care/wound management;Splinting/orthotics;Stair training;Therapeutic Activities;Therapeutic Exercise;UE/LE Strength taining/ROM;UE/LE Coordination activities;Visual/perceptual remediation/compensation;Wheelchair propulsion/positioning PT Transfers Anticipated Outcome(s): supervision PT Locomotion Anticipated Outcome(s): supervision PT Recommendation Recommendations for Other Services: Neuropsych consult Follow Up Recommendations: Home health PT;24 hour supervision/assistance Patient destination: Home Equipment Recommended: To be determined   PT Evaluation Precautions/Restrictions Precautions Precautions: Fall Restrictions Weight Bearing Restrictions: No Pain Pain Assessment Pain Scale: 0-10 Pain Score: 0-No pain Pain Interference Pain Interference Pain Effect on Sleep: 0. Does not apply - I have not had any pain or hurting in the past 5 days Pain Interference with Therapy Activities: 0. Does not apply - I have not received rehabilitationtherapy in the past 5  days Pain Interference with Day-to-Day Activities: 1. Rarely or not at all Home Living/Prior Functioning Home Living Available Help at Discharge: Family;Friend(s);Available 24 hours/day (nephew lives next door) Type of Home: Mobile home (double wide) Home Access: Ramped entrance Home Layout: One level Bathroom  Shower/Tub: Health visitor: Standard Bathroom Accessibility: Yes Additional Comments: States he went to rehab after d/c from hosptial last month and recently returned home with HHPT. Has been ambulating without assistive device and is independent with ADLs.  Lives With: Alone Prior Function Level of Independence: Independent with basic ADLs;Independent with gait;Independent with transfers  Able to Take Stairs?: Yes Driving: No Vocation: Retired Leisure: Hobbies-yes (Comment) (play's guitar) Vision/Perception  Vision - History Ability to See in Adequate Light: 1 Impaired Vision - Assessment Eye Alignment: Within Functional Limits Ocular Range of Motion: Within Functional Limits Alignment/Gaze Preference: Within Defined Limits Tracking/Visual Pursuits: Decreased smoothness of horizontal tracking;Decreased smoothness of vertical tracking Saccades: Decreased speed of saccadic movement Convergence: Impaired (comment) Perception Perception: Within Functional Limits Praxis Praxis: WFL  Cognition Overall Cognitive Status: History of cognitive impairments - at baseline Arousal/Alertness: Awake/alert Orientation Level: Oriented to person;Oriented to place;Oriented to situation;Disoriented to time Year: Other (Comment) (unable to determine) Month: February Day of Week: Correct Attention: Focused;Sustained;Selective Focused Attention: Appears intact Sustained Attention: Appears intact Selective Attention: Impaired Selective Attention Impairment: Verbal basic;Functional basic Memory: Impaired Memory Impairment: Retrieval deficit;Decreased recall of new  information;Decreased short term memory;Storage deficit Decreased Short Term Memory: Functional basic Awareness: Appears intact Problem Solving: Impaired Problem Solving Impairment: Verbal basic;Functional basic Executive Function: Decision Making;Initiating Decision Making: Impaired Decision Making Impairment: Verbal basic;Functional basic Initiating: Impaired Initiating Impairment: Verbal basic;Functional basic Safety/Judgment: Impaired Sensation Sensation Light Touch: Impaired by gross assessment Light Touch Impaired Details: Impaired RLE Hot/Cold: Appears Intact Proprioception: Appears Intact Stereognosis: Not tested Coordination Gross Motor Movements are Fluid and Coordinated: No Fine Motor Movements are Fluid and Coordinated: Yes Finger Nose Finger Test: [facility] Heel Shin Test: slowed overall, no dysmetria Motor  Motor Motor: Abnormal postural alignment and control;Other (comment) Motor - Skilled Clinical Observations: RLE weakness; general deconditioning and weakness   Trunk/Postural Assessment  Cervical Assessment Cervical Assessment: Exceptions to [facility] (forward head) Thoracic Assessment Thoracic Assessment: Exceptions to [facility] (rounded shoulders) Lumbar Assessment Lumbar Assessment: Exceptions to [facility] (posterior tilt) Postural Control Postural Control: Deficits on evaluation Trunk Control: slight L lean in sitting and standing  Balance Balance Balance Assessed: Yes Standardized Balance Assessment Standardized Balance Assessment: Timed Up and Go Test;Berg Balance Test Berg Balance Test Sit to Stand: Able to stand using hands after several tries Standing Unsupported: Able to stand 30 seconds unsupported Sitting with Back Unsupported but Feet Supported on Floor or Stool: Able to sit safely and securely 2 minutes Stand to Sit: Sits independently, has uncontrolled descent Transfers: Able to transfer with verbal cueing and /or supervision Standing Unsupported with Eyes  Closed: Able to stand 3 seconds Standing Ubsupported with Feet Together: Needs help to attain position and unable to hold for 15 seconds From Standing, Reach Forward with Outstretched Arm: Loses balance while trying/requires external support From Standing Position, Pick up Object from Floor: Unable to try/needs assist to keep balance From Standing Position, Turn to Look Behind Over each Shoulder: Needs assist to keep from losing balance and falling Turn 360 Degrees: Needs assistance while turning Standing Unsupported, Alternately Place Feet on Step/Stool: Needs assistance to keep from falling or unable to try Standing Unsupported, One Foot in Front: Loses balance while stepping or standing Standing on One Leg: Unable to try or needs assist to prevent fall Total Score: 13 Timed Up and Go Test TUG: Normal TUG Normal TUG (seconds): 36 (with RW) Static Sitting Balance Static Sitting - Balance Support: No upper extremity supported;Feet supported Static Sitting - Level  of Assistance: 5: Stand by assistance Dynamic Sitting Balance Dynamic Sitting - Balance Support: Feet supported;No upper extremity supported Dynamic Sitting - Level of Assistance: 5: Stand by assistance Static Standing Balance Static Standing - Balance Support: Bilateral upper extremity supported Static Standing - Level of Assistance: 5: Stand by assistance Dynamic Standing Balance Dynamic Standing - Balance Support: During functional activity;Bilateral upper extremity supported Dynamic Standing - Level of Assistance: 4: Min assist Extremity Assessment  RUE Assessment RUE Assessment: Within Functional Limits Active Range of Motion (AROM) Comments: limited to 160 degrees of flexion (premorbid) LUE Assessment LUE Assessment: Within Functional Limits Active Range of Motion (AROM) Comments: limited to 160 degrees of flexion (premorbid) RLE Assessment RLE Assessment: Exceptions to [facility] RLE Strength RLE Overall Strength:  Deficits Right Hip Flexion: 4-/5 Right Hip ABduction: 4/5 Right Hip ADduction: 4/5 Right Knee Flexion: 4-/5 Right Knee Extension: 4/5 Right Ankle Dorsiflexion: 4-/5 LLE Assessment LLE Assessment: Exceptions to [facility] Strength Comments: Grossly 4+/5  Care Tool Care Tool Bed Mobility Roll left and right activity   Roll left and right assist level: Supervision/Verbal cueing    Sit to lying activity   Sit to lying assist level: Supervision/Verbal cueing    Lying to sitting on side of bed activity   Lying to sitting on side of bed assist level: the ability to move from lying on the back to sitting on the side of the bed with no back support.: Supervision/Verbal cueing     Care Tool Transfers Sit to stand transfer   Sit to stand assist level: Contact Guard/Touching assist    Chair/bed transfer   Chair/bed transfer assist level: Minimal Assistance - Patient > 75%     Toilet transfer   Assist Level: Minimal Assistance - Patient > 75%    Car transfer   Car transfer assist level: Minimal Assistance - Patient > 75%      Care Tool Locomotion Ambulation   Assist level: Minimal Assistance - Patient > 75% Assistive device: Walker-rolling Max distance: 246ft  Walk 10 feet activity   Assist level: Minimal Assistance - Patient > 75% Assistive device: Walker-rolling   Walk 50 feet with 2 turns activity   Assist level: Minimal Assistance - Patient > 75% Assistive device: Walker-rolling  Walk 150 feet activity   Assist level: Minimal Assistance - Patient > 75% Assistive device: Walker-rolling  Walk 10 feet on uneven surfaces activity   Assist level: Minimal Assistance - Patient > 75% Assistive device: Walker-rolling  Stairs   Assist level: Minimal Assistance - Patient > 75% Stairs assistive device: 2 hand rails Max number of stairs: 12  Walk up/down 1 step activity   Walk up/down 1 step (curb) assist level: Minimal Assistance - Patient > 75% Walk up/down 1 step or curb  assistive device: 2 hand rails  Walk up/down 4 steps activity   Walk up/down 4 steps assist level: Minimal Assistance - Patient > 75% Walk up/down 4 steps assistive device: 2 hand rails  Walk up/down 12 steps activity   Walk up/down 12 steps assist level: Minimal Assistance - Patient > 75% Walk up/down 12 steps assistive device: 2 hand rails  Pick up small objects from floor Pick up small object from the floor (from standing position) activity did not occur: Safety/medical concerns      Wheelchair Is the patient using a wheelchair?: No          Wheel 50 feet with 2 turns activity      Wheel 150 feet activity  Refer to Care Plan for Long Term Goals  SHORT TERM GOAL WEEK 1 PT Short Term Goal 1 (Week 1): STG = LTG due to ELOS  Recommendations for other services: Neuropsych  Skilled Therapeutic Intervention Mobility Bed Mobility Bed Mobility: Rolling Right;Rolling Left;Sit to Supine;Supine to Sit Rolling Right: Supervision/verbal cueing Rolling Left: Supervision/Verbal cueing Supine to Sit: Supervision/Verbal cueing Sit to Supine: Supervision/Verbal cueing Transfers Transfers: Sit to Stand;Stand Pivot Transfers;Stand to Sit Sit to Stand: Minimal Assistance - Patient > 75% Stand to Sit: Minimal Assistance - Patient > 75% Stand Pivot Transfers: Minimal Assistance - Patient > 75% Stand Pivot Transfer Details: Tactile cues for initiation;Verbal cues for sequencing;Visual cues/gestures for sequencing;Verbal cues for gait pattern;Verbal cues for precautions/safety;Tactile cues for posture Transfer (Assistive device): None Locomotion  Gait Ambulation: Yes Gait Assistance: Minimal Assistance - Patient > 75% Gait Distance (Feet): 200 Feet Assistive device: Rolling walker Gait Assistance Details: Verbal cues for gait pattern;Verbal cues for safe use of DME/AE;Verbal cues for precautions/safety;Tactile cues for initiation;Tactile cues for posture Gait Gait: Yes Gait  Pattern: Impaired Gait Pattern: Step-through pattern;Right genu recurvatum;Poor foot clearance - right;Trunk flexed;Decreased dorsiflexion - right;Decreased hip/knee flexion - right Stairs / Additional Locomotion Stairs: Yes Stairs Assistance: Minimal Assistance - Patient > 75% Stair Management Technique: Two rails;Forwards;Step to pattern Number of Stairs: 12 Height of Stairs: 6 Ramp: Minimal Assistance - Patient >75%   Skilled Intervention: Pt in bed lying flat, holding the phone with his R hand against his ear but appearing to be asleep. Awakens to voice but then falls back asleep rather quickly. Pt pleasant and cooperative during assessment, flat affective noted. Initiated functional mobility as outlined above, requiring frequent seated rest breaks due to fatigue - especially towards end of session. Pt presents with generalized weakness and deconditioning with poor unsupported standing balance Sharlene Motts 13/56). He has some RLE >LLE weakness as well. Recommend continued skilled PT in CIR setting to address functional impairments listed above. Session concluded with patient lying in bed, alarm on, all needs met.    Discharge Criteria: Patient will be discharged from PT if patient refuses treatment 3 consecutive times without medical reason, if treatment goals not met, if there is a change in medical status, if patient makes no progress towards goals or if patient is discharged from hospital.  The above assessment, treatment plan, treatment alternatives and goals were discussed and mutually agreed upon: by patient  Orrin Brigham  PT, DPT, CSRS  02/19/2023, 1:44 PM

## 2023-02-19 NOTE — Plan of Care (Signed)
  Problem: RH Balance Goal: LTG Patient will maintain dynamic standing with ADLs (OT) Description: LTG:  Patient will maintain dynamic standing balance with assist during activities of daily living (OT)  Flowsheets (Taken 02/19/2023 1323) LTG: Pt will maintain dynamic standing balance during ADLs with: Independent with assistive device   Problem: Sit to Stand Goal: LTG:  Patient will perform sit to stand in prep for activites of daily living with assistance level (OT) Description: LTG:  Patient will perform sit to stand in prep for activites of daily living with assistance level (OT) Flowsheets (Taken 02/19/2023 1323) LTG: PT will perform sit to stand in prep for activites of daily living with assistance level: Independent with assistive device   Problem: RH Bathing Goal: LTG Patient will bathe all body parts with assist levels (OT) Description: LTG: Patient will bathe all body parts with assist levels (OT) Flowsheets (Taken 02/19/2023 1323) LTG: Pt will perform bathing with assistance level/cueing: Set up assist    Problem: RH Dressing Goal: LTG Patient will perform upper body dressing (OT) Description: LTG Patient will perform upper body dressing with assist, with/without cues (OT). Flowsheets (Taken 02/19/2023 1323) LTG: Pt will perform upper body dressing with assistance level of: Independent Goal: LTG Patient will perform lower body dressing w/assist (OT) Description: LTG: Patient will perform lower body dressing with assist, with/without cues in positioning using equipment (OT) Flowsheets (Taken 02/19/2023 1323) LTG: Pt will perform lower body dressing with assistance level of: Independent with assistive device   Problem: RH Toileting Goal: LTG Patient will perform toileting task (3/3 steps) with assistance level (OT) Description: LTG: Patient will perform toileting task (3/3 steps) with assistance level (OT)  Flowsheets (Taken 02/19/2023 1323) LTG: Pt will perform toileting task (3/3  steps) with assistance level: Independent with assistive device   Problem: RH Toilet Transfers Goal: LTG Patient will perform toilet transfers w/assist (OT) Description: LTG: Patient will perform toilet transfers with assist, with/without cues using equipment (OT) Flowsheets (Taken 02/19/2023 1323) LTG: Pt will perform toilet transfers with assistance level of: Independent with assistive device   Problem: RH Tub/Shower Transfers Goal: LTG Patient will perform tub/shower transfers w/assist (OT) Description: LTG: Patient will perform tub/shower transfers with assist, with/without cues using equipment (OT) Flowsheets (Taken 02/19/2023 1323) LTG: Pt will perform tub/shower stall transfers with assistance level of: Supervision/Verbal cueing LTG: Pt will perform tub/shower transfers from: Walk in shower

## 2023-02-19 NOTE — Evaluation (Signed)
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)  Past Medical History:  Past Medical History:  Diagnosis Date   Acute on chronic combined systolic and diastolic CHF (congestive heart failure) (HCC) 11/24/2008   Qualifier: Diagnosis of   By: Johnnette Barrios, CNA, Sandy         Acute renal failure with acute tubular necrosis superimposed on stage 3b chronic kidney disease (HCC) 12/15/2021   Allergy    Aortic stenosis    Arthritis    hands   BPH (benign prostatic hyperplasia)    CHF (congestive heart failure) (HCC)    CKD (chronic kidney disease)    COVID-19    COVID-19 virus infection 12/16/2021   Diabetes mellitus    Dyspnea    DYSPNEA 11/24/2008   Qualifier: Diagnosis of   By: Johnnette Barrios, CNA, Sandy         Hyperlipidemia    Hypertension    Hypertensive urgency 12/10/2021   Myocardial infarction [facility]) 06/10/2002   NSTEMI (non-ST elevated myocardial infarction) (HCC) 12/10/2021   Stroke (HCC) 09/2020   Some memory deficits   Vertigo    Wears dentures    full upper   Past Surgical History:  Past Surgical History:  Procedure Laterality Date   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  Redo of 2V CABG, Aortic Root repair and AVR   CATARACT EXTRACTION W/PHACO Right 11/19/2021   Procedure: CATARACT EXTRACTION PHACO AND INTRAOCULAR LENS PLACEMENT (IOC) RIGHT DIABETIC;  Surgeon: Nevada Crane, MD;  Location: [facility];  Service: Ophthalmology;  Laterality: Right;  7.32 0:54.3   CATARACT EXTRACTION W/PHACO Left 12/03/2021   Procedure: CATARACT EXTRACTION PHACO AND  INTRAOCULAR LENS PLACEMENT (IOC) LEFT DIABETIC;  Surgeon: Estanislado Pandy, MD;  Location: [facility];  Service: Ophthalmology;  Laterality: Left;  LEAVE FIRST CASE Diabetic 3.74 00:36.3   CORONARY ARTERY BYPASS GRAFT  2009   x4; stents to diagonal a mid right coronary lesion; drug eluting stent in the mid RCA as well as ostium of the diagonal with a cutting balloon angioplasty of the diagonal.   CORONARY STENT INTERVENTION N/A 12/14/2021   Procedure: CORONARY STENT INTERVENTION;  Surgeon: Iran Ouch, MD;  Location: [facility] INVASIVE CV LAB;  Service: Cardiovascular;  Laterality: N/A;   CORONARY ULTRASOUND/IVUS N/A 12/14/2021   Procedure: Intravascular Ultrasound/IVUS;  Surgeon: Iran Ouch, MD;  Location: [facility] INVASIVE CV LAB;  Service: Cardiovascular;  Laterality: N/A;   JOINT REPLACEMENT     RIGHT HEART CATH AND CORONARY/GRAFT ANGIOGRAPHY N/A 12/12/2021   Procedure: RIGHT HEART CATH AND CORONARY/GRAFT ANGIOGRAPHY;  Surgeon: Yvonne Kendall, MD;  Location: [facility] INVASIVE CV LAB;  Service: Cardiovascular;  Laterality: N/A;    Assessment / Plan / Recommendation Clinical Impression  Shane Holloway is a 72 year old male who presented to the [facility] ED on 9/04 with dizziness and N/V. He had been hospitalized from 01/24/2023 through 01/27/2023 with acute superior left perirolandic cortex infarct. Also diagnosed with acute left internal jugular and subclavian DVT placed on eliquis and aspirin. New MRI showed acute to subacute strokes. Consulted Dr. Amada Jupiter from neurology as well as general surgery.  Highly concerning for central embolic source.  CT ab/pel showed dilated ileal small bowel segments in the right upper to mid abdomen with collapsed mid/lower small bowel consistent with intermediate to high-grade SBO; exact transition point was not found; etiology could be enteritis, occult adhesions or occult internal hernia.  Etiology of stroke uncontrolled hypertension +/- suspected Afib or  paradoxical emboli. Transitioned from heparin to Eliquis 5 BID. 30 day monitor recommended. He tolerated exercises well but needs cues to initiate intermittently, inconsistently following single step commands. The patient requires inpatient medicine and rehabilitation evaluations and services for ongoing dysfunction secondary to acute to subacute strokes, vascular dementia. PMH significant for DVT on Eliquis which he had held for dental procedure PTA on 8/16. He is s/p AVR, has hx of vascular dementia, CHF, CAD, DM, hypertension, hyperlipidemia, BPH and CKD. Pt was admitted to CIR on 02/18/2023.  Bedside Swallow Evaluation completed as pt is currently on a soft diet. Per pt he recently underwent dental extraction of lower dentition. Oral motor evaluation revealed upper dentures and edentulous lower gums. He was able to elicit a volitional cough and subsequent strong cough. Otherwise, oral mechanism exam was unremarkable. Trials of thin liquid, Dys 3 and regular solids presented. Pt administered all trials. He consumed thin liquids via straw and cup edge, multiple and single sips. He was without s/sx pen/asp. He consumed soft and regular solids with appeared adequate bolus formation, transit, and deglutition with minimal to no lingual residue. He denied discomfort to lower gums during mastication. SLP recommending continue with soft solids and thin liquids with slow advancement to regular solids as pt able to tolerate.  COGNISTAT administered. At baseline pt presents with cognitive deficits. He presents with awareness of baseline deficits and verbalized dx of dementia. No family at baseline to confirm or refute if current cognitive deficits are at baseline or impacted by CVA. COGNISTAT revealed mild impairments in orientation and judgment, moderate deficits in memory, and severe impairments in constructional ability, calculations, and identifying similarities. Informally, pt verbalized events leading up to most  recent hospitalization however; he did not recall previous hx of strokes. He was able to verbalize PLOF and receiving assistance with higher level tasks such as medication and financial management. At end of session, pt able to identify call button and examples of utilization.   Pt would benefit from skilled SLP services to maximize cognition in order to maximize his independence prior to discharge. Anticipate pt will require supervision at home and f/u home health SLP services.    Skilled Therapeutic Interventions          BSE, informal assessment measures, and COGNISTAT administered. Please see full report for additional details.     SLP Assessment  Patient will need skilled Speech Lanaguage Pathology Services during CIR admission    Recommendations  SLP Diet Recommendations: Age appropriate regular solids (soft diet due to recent denal extraction) Liquid Administration via: Cup;Straw Medication Administration: Whole meds with liquid Supervision: Patient able to self feed Compensations: Slow rate;Small sips/bites Postural Changes and/or Swallow Maneuvers: Seated upright 90 degrees;Upright 30-60 min after meal Oral Care Recommendations: Oral care BID Patient destination: Home Follow up Recommendations: Home Health SLP;Outpatient SLP;24 hour supervision/assistance Equipment Recommended: To be determined    SLP Frequency 3 to 5 out of 7 days   SLP Duration  SLP Intensity  SLP Treatment/Interventions 7-10 days  Minumum of 1-2 x/day, 30 to 90 minutes  Cognitive remediation/compensation;Internal/external aids;Cueing hierarchy;Functional tasks;Patient/family education    Pain Pain Assessment Pain Scale:  0-10 Pain Score: 0-No pain  Prior Functioning Cognitive/Linguistic Baseline: Baseline deficits Type of Home: Mobile home  Lives With: Alone Available Help at Discharge: Family;Friend(s);Available 24 hours/day Vocation: Retired (retired Nutritional therapist)  SLP Evaluation Cognition Overall  Cognitive Status: History of cognitive impairments - at baseline Arousal/Alertness: Awake/alert Orientation Level: Oriented to person;Oriented to place Year: Other (Comment) (unable to determine) Month: February Day of Week: Correct Attention: Selective Selective Attention: Impaired Selective Attention Impairment: Verbal basic;Functional basic Memory: Impaired Memory Impairment: Retrieval deficit;Decreased recall of new information;Decreased short term memory;Storage deficit Decreased Short Term Memory: Functional basic Awareness: Appears intact Problem Solving: Impaired Problem Solving Impairment: Verbal basic;Functional basic Executive Function: Decision Making;Initiating Decision Making: Impaired Decision Making Impairment: Verbal basic;Functional basic Initiating: Impaired Initiating Impairment: Verbal basic;Functional basic Safety/Judgment: Impaired  Comprehension Auditory Comprehension Overall Auditory Comprehension: Impaired Yes/No Questions: Within Functional Limits Commands: Impaired One Step Basic Commands: 75-100% accurate Two Step Basic Commands: 50-74% accurate Conversation: Simple Interfering Components: Working memory EffectiveTechniques: Extra processing time;Repetition;Slowed speech Visual Recognition/Discrimination Discrimination: Not tested Reading Comprehension Reading Status: Not tested Expression Expression Primary Mode of Expression: Verbal Verbal Expression Overall Verbal Expression: Appears within functional limits for tasks assessed Written Expression Dominant Hand: Right Oral Motor Oral Motor/Sensory Function Overall Oral Motor/Sensory Function: Within functional limits Motor Speech Overall Motor Speech: Appears within functional limits for tasks assessed  Care Tool Care Tool Cognition Ability to hear (with hearing aid or hearing appliances if normally used Ability to hear (with hearing aid or hearing appliances if normally used): 1.  Minimal difficulty - difficulty in some environments (e.g. when person speaks softly or setting is noisy)   Expression of Ideas and Wants Expression of Ideas and Wants: 3. Some difficulty - exhibits some difficulty with expressing needs and ideas (e.g, some words or finishing thoughts) or speech is not clear   Understanding Verbal and Non-Verbal Content Understanding Verbal and Non-Verbal Content: 3. Usually understands - understands most conversations, but misses some part/intent of message. Requires cues at times to understand  Memory/Recall Ability Memory/Recall Ability : That he or she is in a hospital/hospital unit   Bedside Swallowing Assessment General Temperature Spikes Noted: No Respiratory Status: Room air History of Recent Intubation: No Behavior/Cognition: Alert;Cooperative;Pleasant mood Oral Cavity - Dentition: Dentures, top;Edentulous Self-Feeding Abilities: Able to feed self Patient Positioning: Upright in bed Baseline Vocal Quality: Normal Volitional Cough: Strong Volitional Swallow: Able to elicit  Oral Care Assessment Oral Assessment  (WDL): Exceptions to WDL Lips: Symmetrical Teeth: Dentures upper (edentulous lower) Tongue: Pink;Moist Mucous Membrane(s): Moist;Pink Saliva: Moist, saliva free flowing Level of Consciousness: Alert Is patient on any of following O2 devices?: None of the above Nutritional status: No high risk factors Oral Assessment Risk : Low Risk Ice Chips Ice chips: Not tested Thin Liquid Thin Liquid: Within functional limits Presentation: Straw;Cup Nectar Thick Nectar Thick Liquid: Not tested Honey Thick Honey Thick Liquid: Not tested Puree Puree: Not tested Solid Solid: Within functional limits Presentation: Self Fed;Spoon BSE Assessment Risk for Aspiration Impact on safety and function: Mild aspiration risk Other Related Risk Factors: Cognitive impairment;Previous CVA  Short Term Goals: Week 1: SLP Short Term Goal 1 (Week 1):  STGs= LTGs due to ELOS  Refer to Care Plan for Long Term Goals  Recommendations for other services: None   Discharge Criteria: Patient will be discharged from SLP if patient refuses treatment 3 consecutive times without medical reason, if treatment goals not met, if there is a change in medical status, if patient makes no progress towards  goals or if patient is discharged from hospital.  The above assessment, treatment plan, treatment alternatives and goals were discussed and mutually agreed upon: by patient  Renaee Munda 02/19/2023, 12:21 PM

## 2023-02-19 NOTE — Progress Notes (Signed)
Inpatient Rehabilitation Center Individual Statement of Services  Patient Name:  Shane Holloway  Date:  02/19/2023  Welcome to the Inpatient Rehabilitation Center.  Our goal is to provide you with an individualized program based on your diagnosis and situation, designed to meet your specific needs.  With this comprehensive rehabilitation program, you will be expected to participate in at least 3 hours of rehabilitation therapies Monday-Friday, with modified therapy programming on the weekends.  Your rehabilitation program will include the following services:  Physical Therapy (PT), Occupational Therapy (OT), Speech Therapy (ST), 24 hour per day rehabilitation nursing, Therapeutic Recreaction (TR), Neuropsychology, Care Coordinator, Rehabilitation Medicine, Nutrition Services, Pharmacy Services, and Other  Weekly team conferences will be held on Wednesdays to discuss your progress.  Your Inpatient Rehabilitation Care Coordinator will talk with you frequently to get your input and to update you on team discussions.  Team conferences with you and your family in attendance may also be held.  Expected length of stay: 7-10 Days  Overall anticipated outcome:  Supervision  Depending on your progress and recovery, your program may change. Your Inpatient Rehabilitation Care Coordinator will coordinate services and will keep you informed of any changes. Your Inpatient Rehabilitation Care Coordinator's name and contact numbers are listed  below.  The following services may also be recommended but are not provided by the Inpatient Rehabilitation Center:   Home Health Rehabiltiation Services Outpatient Rehabilitation Services    Arrangements will be made to provide these services after discharge if needed.  Arrangements include referral to agencies that provide these services.  Your insurance has been verified to be:  [employer] Your primary doctor is:  Talbert Forest, MD  Pertinent  information will be shared with your doctor and your insurance company.  Inpatient Rehabilitation Care Coordinator:  Lavera Guise, [place] [phone] or [phone]  Information discussed with and copy given to patient by: [name], 02/19/2023, 8:59 AM

## 2023-02-19 NOTE — Progress Notes (Addendum)
PROGRESS NOTE  No issues overnite , pt aware of upcoming therapy , discussed rehab process  Subjective/Complaints:    Objective:   No results found. Recent Labs    02/18/23 0546 02/19/23 0705  WBC 6.4 7.1  HGB 12.3* 13.5  HCT 37.6* 40.4  PLT 119* 134*   Recent Labs    02/17/23 0739 02/18/23 0546  NA 135 137  K 3.3* 3.7  CL 104 106  CO2 23 23  GLUCOSE 164* 150*  BUN 33* 37*  CREATININE 2.77* 2.97*  CALCIUM 8.4* 8.4*    Intake/Output Summary (Last 24 hours) at 02/19/2023 0826 Last data filed at 02/19/2023 0729 Gross per 24 hour  Intake 440 ml  Output 2025 ml  Net -1585 ml        Physical Exam: Vital Signs Blood pressure (!) 155/85, pulse 66, temperature 98.4 F (36.9 C), temperature source Oral, resp. rate 16, height 6\' 2"  (1.88 m), weight 97.8 kg, SpO2 96%.   General: No acute distress Mood and affect are appropriate Heart: Regular rate and rhythm no rubs murmurs or extra sounds Lungs: Clear to auscultation, breathing unlabored, no rales or wheezes Abdomen: Positive bowel sounds, soft nontender to palpation, nondistended Extremities: No clubbing, cyanosis, or edema Skin: No evidence of breakdown, no evidence of rash Pt oriented to person place day of week but not month or date Neurologic: Cranial nerves II through XII intact, motor strength is 5/5 in Left and 4/5 RIght deltoid, bicep, tricep, grip, hip flexor, knee extensors, ankle dorsiflexor and plantar flexor Sensory exam unable to eval due to word finding issues   Musculoskeletal: Full range of motion in all 4 extremities. No joint swelling    Assessment/Plan: 1. Functional deficits which require 3+ hours per day of interdisciplinary therapy in a comprehensive inpatient rehab setting. Physiatrist is providing close team supervision and 24 hour management of active medical problems listed below. Physiatrist and rehab team continue to assess  barriers to discharge/monitor patient progress toward functional and medical goals  Care Tool:  Bathing              Bathing assist       Upper Body Dressing/Undressing Upper body dressing        Upper body assist      Lower Body Dressing/Undressing Lower body dressing            Lower body assist       Toileting Toileting    Toileting assist       Transfers Chair/bed transfer  Transfers assist           Locomotion Ambulation   Ambulation assist              Walk 10 feet activity   Assist           Walk 50 feet activity   Assist           Walk 150 feet activity   Assist           Walk 10 feet on uneven surface  activity   Assist           Wheelchair     Assist  Wheelchair 50 feet with 2 turns activity    Assist            Wheelchair 150 feet activity     Assist          Blood pressure (!) 155/85, pulse 66, temperature 98.4 F (36.9 C), temperature source Oral, resp. rate 16, height 6\' 2"  (1.88 m), weight 97.8 kg, SpO2 96%.  Medical Problem List and Plan: 1. Functional deficits secondary to L posterior cerebral and R frontal lobe              -patient may  shower             -ELOS/Goals: team conf today initial evals in progress ELOS~2wks, lives with brother who is in home hospice but has nephew who can assist    2.  Antithrombotics: -DVT/anticoagulation:  Pharmaceutical: Eliquis             -antiplatelet therapy: Aspirin 81 mg daily   3. Pain Management: Tylenol as needed   4. Mood/Behavior/Sleep: LCSW to evaluate and provide emotional support             -antipsychotic agents: n/a   5. Neuropsych/cognition: This patient is? capable of making decisions on his own behalf.   6. Skin/Wound Care: Routine skin care checks   7. Fluids/Electrolytes/Nutrition: Routine Is and Os and follow-up chemistries   8: Hypertension: monitor TID and prn              -continue Entresto             -continue metoprolol 50 mg daily             -continue Pacerone 200 mg daily   9: Hyperlipidemia: continue statin, Zetia   10: Previous AVR on Eliquis   11: Acute left internal jugular and subclavian DVT on Eliquis 5 mg BID   12: DM-2: CBGs q AC and q HS; (home regimen includes NPH 12 units [place] daily with breakfast, Jardiance, semaglutide)             -continue Jardiance 10 mg daily             -continue SSI             -continue Levemir 8 units q HS             CBG (last 3)  Recent Labs    02/18/23 1159 02/18/23 1624 02/18/23 2127  GLUCAP 166* 140* 177*    13: BPH: continue Flomax and finasteride   14: Chronic systolic/diastolic CHF: EF = 55-60%; not on duretic at home             -daily weight             -continue Pacerone 200 mg daily             -continue metoprolol succ 50 mg daily -Entresto held until orthostasis resolves   15: CKD stage IV: baseline ~2.5-3.0             -follow-up BMP at baseline   16: History of SVT: on BB   17: Orthostatic hypotension: monitor with increased mobility             -TED hose and abd binder             -Entresto held   18: History of CAD, CABG, stents on statin, BB   19. Small bowel obstruction- has resolved, but at risk   20.  Mild thrombocytopenia  cont to monitor  LOS: 1 days A FACE TO FACE EVALUATION WAS PERFORMED  Erick Colace 02/19/2023, 8:26 AM

## 2023-02-19 NOTE — Plan of Care (Signed)
  Problem: RH Balance Goal: LTG Patient will maintain dynamic standing balance (PT) Description: LTG:  Patient will maintain dynamic standing balance with assistance during mobility activities (PT) Flowsheets (Taken 02/19/2023 1345) LTG: Pt will maintain dynamic standing balance during mobility activities with:: Supervision/Verbal cueing   Problem: Sit to Stand Goal: LTG:  Patient will perform sit to stand with assistance level (PT) Description: LTG:  Patient will perform sit to stand with assistance level (PT) Flowsheets (Taken 02/19/2023 1345) LTG: PT will perform sit to stand in preparation for functional mobility with assistance level: Independent with assistive device   Problem: RH Bed Mobility Goal: LTG Patient will perform bed mobility with assist (PT) Description: LTG: Patient will perform bed mobility with assistance, with/without cues (PT). Flowsheets (Taken 02/19/2023 1345) LTG: Pt will perform bed mobility with assistance level of: Independent   Problem: RH Bed to Chair Transfers Goal: LTG Patient will perform bed/chair transfers w/assist (PT) Description: LTG: Patient will perform bed to chair transfers with assistance (PT). Flowsheets (Taken 02/19/2023 1345) LTG: Pt will perform Bed to Chair Transfers with assistance level: Independent with assistive device    Problem: RH Car Transfers Goal: LTG Patient will perform car transfers with assist (PT) Description: LTG: Patient will perform car transfers with assistance (PT). Flowsheets (Taken 02/19/2023 1345) LTG: Pt will perform car transfers with assist:: Contact Guard/Touching assist   Problem: RH Ambulation Goal: LTG Patient will ambulate in controlled environment (PT) Description: LTG: Patient will ambulate in a controlled environment, # of feet with assistance (PT). Flowsheets (Taken 02/19/2023 1345) LTG: Pt will ambulate in controlled environ  assist needed:: Supervision/Verbal cueing LTG: Ambulation distance in  controlled environment: 166ft Goal: LTG Patient will ambulate in home environment (PT) Description: LTG: Patient will ambulate in home environment, # of feet with assistance (PT). Flowsheets (Taken 02/19/2023 1345) LTG: Pt will ambulate in home environ  assist needed:: Supervision/Verbal cueing LTG: Ambulation distance in home environment: 2ft Goal: LTG Patient will ambulate in community environment (PT) Description: LTG: Patient will ambulate in community environment, # of feet with assistance (PT). Flowsheets (Taken 02/19/2023 1345) LTG: Pt will ambulate in community environ  assist needed:: Contact Guard/Touching assist LTG: Ambulation distance in community environment: 219ft   Problem: RH Stairs Goal: LTG Patient will ambulate up and down stairs w/assist (PT) Description: LTG: Patient will ambulate up and down # of stairs with assistance (PT) Flowsheets (Taken 02/19/2023 1345) LTG: Pt will ambulate up/down stairs assist needed:: Contact Guard/Touching assist LTG: Pt will  ambulate up and down number of stairs: 12

## 2023-02-19 NOTE — Progress Notes (Signed)
Patient ID: Shane Holloway, male   DOB: 11/22/1950, 72 y.o.   MRN: [id number]  Team Conference Report to Patient/Family  Team Conference discussion was reviewed with the patient and caregiver, including goals, any changes in plan of care and target discharge date.  Patient and caregiver express understanding and are in agreement.  The patient has a target discharge date of  (evals pending).  Sw spoke with pt's niece, [name]. Sw introduced self and explained role. Niece will serve as primary caregiver to the patient. Nephew, [name]  and friend, [name] to assist. Niece expressed that pt's brother, [name] who he served primary caregiver to is currently at home on hospice. (Pt's brother, [name] prior admission on CIR and sent home with hospice). Niece expressed that pt's nephew lives directly next door. SW expressed that patient will require 24/7 supervision at discharge. Niece aware and shares that a family member will be present daily to supervision patient.   Pt's brother is transitioning and patient is concerned about being unable to attend the service. Patient will be unable to attend the service and return to CIR. Sw provided cell phone number to niece so family can keep SW aware of any changes with pt's brother. If pt's brother transitions while on CIR SW will assist pt and family with a safe d/c plan while still allowing him to be present for the service.    SW informed niece that SW will have updated conference information next week with a potential discharge date. No additional questions or concerns. [name] 02/19/2023, 1:48 PM

## 2023-02-19 NOTE — Plan of Care (Signed)
  Problem: RH Swallowing Goal: LTG Patient will consume least restrictive diet using compensatory strategies with assistance (SLP) Description: LTG:  Patient will consume least restrictive diet using compensatory strategies with assistance (SLP) Flowsheets (Taken 02/19/2023 1252) LTG: Pt Patient will consume least restrictive diet using compensatory strategies with assistance of (SLP): Modified Independent   Problem: RH Cognition - SLP Goal: RH LTG Patient will demonstrate orientation with cues Description:  LTG:  Patient will demonstrate orientation to person/place/time/situation with cues (SLP)   Flowsheets (Taken 02/19/2023 1252) LTG Patient will demonstrate orientation to:  Person  Place  Time  Situation LTG: Patient will demonstrate orientation using cueing (SLP): Minimal Assistance - Patient > 75%   Problem: RH Problem Solving Goal: LTG Patient will demonstrate problem solving for (SLP) Description: LTG:  Patient will demonstrate problem solving for basic/complex daily situations with cues  (SLP) Flowsheets (Taken 02/19/2023 1252) LTG: Patient will demonstrate problem solving for (SLP): Basic daily situations LTG Patient will demonstrate problem solving for: Supervision   Problem: RH Memory Goal: LTG Patient will follow step by step directions w/cues (SLP) Description: LTG: Patient will follow step by step directions with cues (SLP). Flowsheets (Taken 02/19/2023 1252) LTG: Patient will follow step by step directions: 2 steps LTG: Patient will follow step by step directions w/cues: Minimal Assistance - Patient > 75% Goal: LTG Patient will use memory compensatory aids to (SLP) Description: LTG:  Patient will use memory compensatory aids to recall biographical/new, daily complex information with cues (SLP) Flowsheets (Taken 02/19/2023 1252) LTG: Patient will use memory compensatory aids to (SLP): Minimal Assistance - Patient > 75%   Problem: RH Attention Goal: LTG Patient will  demonstrate this level of attention during functional activites (SLP) Description: LTG:  Patient will will demonstrate this level of attention during functional activites (SLP) Flowsheets (Taken 02/19/2023 1252) Patient will demonstrate during cognitive/linguistic activities the attention type of: Selective Patient will demonstrate this level of attention during cognitive/linguistic activities in: Controlled LTG: Patient will demonstrate this level of attention during cognitive/linguistic activities with assistance of (SLP): Supervision

## 2023-02-19 NOTE — Patient Care Conference (Signed)
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 Claudette Laws Social Worker Present: Lavera Guise, BSW Nurse Present: Chana Bode, RN PT Present: Casimiro Needle, PT OT Present: Primitivo Gauze, OT SLP Present: Everardo Pacific, SLP PPS Coordinator present : Fae Pippin, SLP     Current Status/Progress Goal Weekly Team Focus  Bowel/Bladder   Pt is contient of b/b, but can have some incotinent episodes at times. LBM: 9/9   Develop a regular pattern of bowel elimination and gain control of consistent voiding.   Assist w/ toileting needs q 2-4 hours and as needed.    Swallow/Nutrition/ Hydration   Eval pending           ADL's   Eval pending            Mobility   eval pending           Communication   Eval pending            Safety/Cognition/ Behavioral Observations  Eval pending            Pain   Pt denies pain.   Remain pain free.   Assess pain q shift & PRN.    Skin   Skin intact.   Maintain skin integrity.  Assess skin q shift & PRN.      Discharge Planning:  New admission, assesment pending.   Team Discussion: Patient with mild right side weakness post left PCA CVA with mild CHF and CKD.  Patient on target to meet rehab goals: Evals pending  *See Care Plan and progress notes for  long and short-term goals.   Revisions to Treatment Plan:  N/a   Teaching Needs: Safety, medications, transfers, toileting, etc.   Current Barriers to Discharge: Decreased caregiver support and Home enviroment access/layout  Possible Resolutions to Barriers: Family education HH follow up services     Medical Summary Current Status: HTN, Anticoagulation, DM management  Barriers to Discharge: Medical stability   Possible Resolutions to [employer] Focus: Med managment of DM, HTN   Continued Need for Acute Rehabilitation Level of Care: The patient requires daily medical management by a physician with specialized training in physical medicine and rehabilitation for the following reasons: Direction of a multidisciplinary physical rehabilitation program to maximize functional independence : Yes Medical management of patient stability for increased activity during participation in an intensive rehabilitation regime.: Yes Analysis of laboratory values and/or radiology reports with any subsequent need for medication adjustment and/or medical intervention. : Yes   I attest that I was present, lead the team conference, and concur with the assessment and plan of the team.   Chana Bode B 02/19/2023, 2:44 PM

## 2023-02-20 DIAGNOSIS — N184 Chronic kidney disease, stage 4 (severe): Secondary | ICD-10-CM | POA: Diagnosis not present

## 2023-02-20 DIAGNOSIS — I951 Orthostatic hypotension: Secondary | ICD-10-CM

## 2023-02-20 DIAGNOSIS — E1165 Type 2 diabetes mellitus with hyperglycemia: Secondary | ICD-10-CM

## 2023-02-20 DIAGNOSIS — I63532 Cerebral infarction due to unspecified occlusion or stenosis of left posterior cerebral artery: Secondary | ICD-10-CM | POA: Diagnosis not present

## 2023-02-20 LAB — GLUCOSE, CAPILLARY
Glucose-Capillary: 144 mg/dL — ABNORMAL HIGH (ref 70–99)
Glucose-Capillary: 147 mg/dL — ABNORMAL HIGH (ref 70–99)
Glucose-Capillary: 159 mg/dL — ABNORMAL HIGH (ref 70–99)
Glucose-Capillary: 256 mg/dL — ABNORMAL HIGH (ref 70–99)

## 2023-02-20 MED ORDER — INSULIN DETEMIR 100 UNIT/ML ~~LOC~~ SOLN
10.0000 [IU] | Freq: Every day | SUBCUTANEOUS | Status: DC
  Administered 2023-02-20 – 2023-02-24 (×5): 10 [IU] via SUBCUTANEOUS
  Filled 2023-02-20 (×6): qty 0.1

## 2023-02-20 NOTE — Discharge Instructions (Addendum)
Inpatient Rehab Discharge Instructions  Shane Holloway [facility] Discharge date and time: 02/25/2023  Activities/Precautions/ Functional Status: Activity: no lifting, driving, or strenuous exercise until cleared by MD Diet: cardiac diet and diabetic diet Wound Care: none needed Functional status:  ___ No restrictions     ___ Walk up steps independently _x__ 24/7 supervision/assistance   ___ Walk up steps with assistance ___ Intermittent supervision/assistance  ___ Bathe/dress independently ___ Walk with walker     ___ Bathe/dress with assistance ___ Walk Independently    ___ Shower independently ___ Walk with assistance    __x_ Shower with assistance _x__ No alcohol     ___ Return to work/school ________  Special Instructions: No driving, alcohol consumption or tobacco use  Recommend checking fingerstick blood sugars four (4) times daily and record. Bring this information with you to follow-up appointment with PCP. Recommend daily BP measurement in same arm and record time of day. Bring this information with you to follow-up appointment with PCP.   COMMUNITY REFERRALS UPON DISCHARGE:    Home Health:   PT     OT                     Agency: [facility] [phone]    STROKE/TIA DISCHARGE INSTRUCTIONS SMOKING Cigarette smoking nearly doubles your risk of having a stroke & is the single most alterable risk factor  If you smoke or have smoked in the last 12 months, you are advised to quit smoking for your health. Most of the excess cardiovascular risk related to smoking disappears within a year of stopping. Ask you doctor about anti-smoking medications [place] Quit Line: 1-800-QUIT NOW Free Smoking Cessation Classes [phone]  CHOLESTEROL Know your levels; limit fat & cholesterol in your diet  Lipid Panel     Component Value Date/Time   CHOL 90 01/25/2023 0301   TRIG 188 (H) 01/25/2023 0301   HDL 23 (L) 01/25/2023 0301   CHOLHDL 3.9 01/25/2023 0301   VLDL 38 01/25/2023 0301    LDLCALC 29 01/25/2023 0301     Many patients benefit from treatment even if their cholesterol is at goal. Goal: Total Cholesterol (CHOL) less than 160 Goal:  Triglycerides (TRIG) less than 150 Goal:  HDL greater than 40 Goal:  LDL (LDLCALC) less than 100   BLOOD PRESSURE American Stroke Association blood pressure target is less that 120/80 mm/Hg  Your discharge blood pressure is:  BP: (!) 140/87 Monitor your blood pressure Limit your salt and alcohol intake Many individuals will require more than one medication for high blood pressure  DIABETES (A1c is a blood sugar average for last 3 months) Goal HGBA1c is under 7% (HBGA1c is blood sugar average for last 3 months)  Diabetes: Diagnosis of diabetes:  Your A1c: 8.8 %    Lab Results  Component Value Date   HGBA1C 8.8 (H) 01/25/2023    Your HGBA1c can be lowered with medications, healthy diet, and exercise. Check your blood sugar as directed by your physician Call your physician if you experience unexplained or low blood sugars.  PHYSICAL ACTIVITY/REHABILITATION Goal is 30 minutes at least 4 days per week  Activity: Increase activity slowly, Therapies: Physical Therapy: Home Health, Occupational Therapy: Home Health, and Speech Therapy: Home Health Return to work: N/A Activity decreases your risk of heart attack and stroke and makes your heart stronger.  It helps control your weight and blood pressure; helps you relax and can improve your mood. Participate in a regular exercise program. Talk with  your doctor about the best form of exercise for you (dancing, walking, swimming, cycling).  DIET/WEIGHT Goal is to maintain a healthy weight  Your discharge diet is:  Diet Order             DIET SOFT Room service appropriate? Yes; Fluid consistency: Thin  Diet effective now                  thin liquids Your height is:  Height: 6\' 2"  (188 cm) Your current weight is: Weight: 98.7 kg Your Body Mass Index (BMI) is:  BMI (Calculated):  27.93 Following the type of diet specifically designed for you will help prevent another stroke. Your goal weight range is:   Your goal Body Mass Index (BMI) is 19-24. Healthy food habits can help reduce 3 risk factors for stroke:  High cholesterol, hypertension, and excess weight.  RESOURCES Stroke/Support Group:  Call [phone]   STROKE EDUCATION PROVIDED/REVIEWED AND GIVEN TO PATIENT Stroke warning signs and symptoms How to activate emergency medical system (call 911). Medications prescribed at discharge. Need for follow-up after discharge. Personal risk factors for stroke. Pneumonia vaccine given: No Flu vaccine given: No My questions have been answered, the writing is legible, and I understand these instructions.  I will adhere to these goals & educational materials that have been provided to me after my discharge from the hospital.     My questions have been answered and I understand these instructions. I will adhere to these goals and the provided educational materials after my discharge from the hospital.  Patient/Caregiver Signature _______________________________ Date __________  Clinician Signature _______________________________________ Date __________  Please bring this form and your medication list with you to all your follow-up doctor's appointments.

## 2023-02-20 NOTE — Progress Notes (Signed)
Occupational Therapy Session Note  Patient Details  Name: Shane Holloway MRN: [id number] Date of Birth: 1951/06/04  Today's Date: 02/20/2023 OT Individual Time: 4098-1191 OT Individual Time Calculation (min): 44 min    Short Term Goals: Week 1:  OT Short Term Goal 1 (Week 1): STGs = LTGs  Skilled Therapeutic Interventions/Progress Updates:   Pt seen for am skilled OT session with focus on self care with mobility and neuromotor component/balance retraining. Pt bed level upon OT arrival. Moved from supine to sit with close S and sit to stand with RW with CGA. Amb straight ahead to sink side for brief standing for oral care and then seated sponge bathing and dressing routine as pt requested to defer shower to tomorrow despite offer this am. Doffed clothing with min A fading to CGA and pt able to complete sponge bath and re-dressing pull over and on clothing with close S UB and CGA with figure 4 for LB and slipper socks only. Pt requested transfer back to bed for rest as he reports not having slept well last night. Close S for SPT and repositioning in bed with all safety measures, needs and bed exit active.    Pain: denies all pain  Therapy Documentation Precautions:  Precautions Precautions: Fall Restrictions Weight Bearing Restrictions: No  Therapy/Group: Individual Therapy  Vicenta Dunning 02/20/2023, 7:38 AM

## 2023-02-20 NOTE — Progress Notes (Addendum)
Patient ID: Shane Holloway, male   DOB: Jan 24, 1951, 72 y.o.   MRN: [id number]  Sw received call from niece and hospice nurse.Pt brother currently actively passing and would like patient to be able to visit his brother to assist with the transitioning process. Sw informed family that it is not something that CIR typically allows but SW would inquire with rehab Nature conservation officer.   Sw reached out to covering physician, Dr. Riley Kill to inquire about pt being allotted a visit with his brother off the unit for a few hours. Dr. Riley Kill has granted the patient to leave the unit with his family for a few hours to visit with his brother.  Sw called niece,  Shane Holloway to instructed her to be present at 2-3 PM PT therapy session. This session will be held to ensure family is able to transfer patient in and out of the car and home.   Pt has a WC and sw will inform family to pick up pt's wheelchair on the way to see his brother. Patient will have a ramp entrance and will remain WC level with his family. Patient will be off the unit anywhere from 2-4 hours. After visit patient will return to CIR. Sw informed the team and unit. Waiting on family to arrive.    2:57 PM:  Patient niece present for education. Patient will remain  W/C level. Patient has insulin due at  [facility]. Niece plans to have patient back before then. PT and nursing will assist with transfer in/out car. No additional questions or concerns.   Please ensure pt signs out on the unit and have him sign back in upon returning.

## 2023-02-20 NOTE — Progress Notes (Signed)
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 (HCC) 12/15/2021   Allergy    Aortic stenosis    Arthritis    hands   BPH (benign prostatic hyperplasia)    CHF (congestive heart failure) (HCC)    CKD (chronic kidney disease)    COVID-19    COVID-19 virus infection 12/16/2021   Diabetes mellitus    Dyspnea    DYSPNEA 11/24/2008   Qualifier: Diagnosis of   By: Johnnette Barrios, CNA, Sandy         Hyperlipidemia    Hypertension    Hypertensive urgency 12/10/2021   Myocardial infarction [facility]) 06/10/2002   NSTEMI (non-ST elevated myocardial infarction) (HCC) 12/10/2021   Stroke (HCC) 09/2020   Some memory deficits   Vertigo    Wears dentures    full upper   Past Surgical History:  Past Surgical History:  Procedure Laterality Date   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  Redo of 2V CABG, Aortic Root repair and AVR   CATARACT EXTRACTION W/PHACO Right 11/19/2021   Procedure: CATARACT EXTRACTION PHACO AND INTRAOCULAR LENS PLACEMENT (IOC) RIGHT DIABETIC;  Surgeon: Nevada Crane, MD;  Location: [facility];  Service: Ophthalmology;  Laterality: Right;  7.32 0:54.3   CATARACT EXTRACTION W/PHACO Left 12/03/2021   Procedure: CATARACT EXTRACTION PHACO AND INTRAOCULAR LENS PLACEMENT (IOC) LEFT DIABETIC;  Surgeon: Estanislado Pandy, MD;  Location: [facility];  Service: Ophthalmology;   Laterality: Left;  LEAVE FIRST CASE Diabetic 3.74 00:36.3   CORONARY ARTERY BYPASS GRAFT  2009   x4; stents to diagonal a mid right coronary lesion; drug eluting stent in the mid RCA as well as ostium of the diagonal with a cutting balloon angioplasty of the diagonal.   CORONARY STENT INTERVENTION N/A 12/14/2021   Procedure: CORONARY STENT INTERVENTION;  Surgeon: Iran Ouch, MD;  Location: [facility] INVASIVE CV LAB;  Service: Cardiovascular;  Laterality: N/A;   CORONARY ULTRASOUND/IVUS N/A 12/14/2021   Procedure: Intravascular Ultrasound/IVUS;  Surgeon: Iran Ouch, MD;  Location: [facility] INVASIVE CV LAB;  Service: Cardiovascular;  Laterality: N/A;   JOINT REPLACEMENT     RIGHT HEART CATH AND CORONARY/GRAFT ANGIOGRAPHY N/A 12/12/2021   Procedure: RIGHT HEART CATH AND CORONARY/GRAFT ANGIOGRAPHY;  Surgeon: Yvonne Kendall, MD;  Location: [facility] INVASIVE CV LAB;  Service: Cardiovascular;  Laterality: N/A;   Social History:  reports that he quit smoking about 29 years ago. His smoking use included cigarettes. He started smoking about 33 years ago. He has a 1 pack-year smoking history. He quit smokeless tobacco use about 29 years ago.  His smokeless tobacco use included chew. He reports that he does not drink alcohol and does not use drugs.  Family / Support Systems Marital Status: Separated How Long?: n/a Patient Roles: Caregiver Spouse/Significant Other: n/a Children: n/a Other Supports: [name] Lucretia Roers, niece, jimmy, friend and nephew, Daemian Binger Anticipated Caregiver: Victoriano Lain, niece, jimmy, friend and nephew, Treyshon Timlin Ability/Limitations  of Caregiver: none per niece Caregiver Availability: 24/7 Family Dynamics: niece express family is very supportive and able to provide 24/7 supervision. Patient brother currently in hospice and potentially will need a d/c to attend service. Pt and his brother are very close.  Social History Preferred language: English Religion: Unknown Cultural  Background: Independent and caregiver for brother. Education: HS Health Literacy - How often do you need to have someone help you when you read instructions, pamphlets, or other written material from your doctor or pharmacy?: Often Writes: Yes Employment Status: Retired Marine scientist Issues: n/a Guardian/Conservator: n/a   Abuse/Neglect Abuse/Neglect Assessment Can Be Completed: Yes Physical Abuse: Denies Verbal Abuse: Denies Sexual Abuse: Denies Exploitation of patient/patient's resources: Denies Self-Neglect: Denies  Patient response to: Social Isolation - How often do you feel lonely or isolated from those around you?: Never  Emotional Status Pt's affect, behavior and adjustment status: pleasant Recent Psychosocial Issues: coping Psychiatric History: n/a Substance Abuse History: n/a  Patient / Family Perceptions, Expectations & Goals Pt/Family understanding of illness & functional limitations: yes, spoke with niece Premorbid pt/family roles/activities: Independent prior limited only going out for appointments. Prior hospital admission, ambulation with W/O AD. Anticipated changes in roles/activities/participation: Family plans to assist 24/7 Pt/family expectations/goals: Supervision  [employer] Agencies: None Premorbid Home Care/DME Agencies: Other (Comment) (RW, SPC, WC, Information systems manager) Transportation available at discharge: family Is the patient able to respond to transportation needs?: Yes In the past 12 months, has lack of transportation kept you from medical appointments or from getting medications?: No In the past 12 months, has lack of transportation kept you from meetings, work, or from getting things needed for daily living?: No Resource referrals recommended: Neuropsychology  Discharge Planning Living Arrangements: Alone Support Systems: Other relatives, Friends/neighbors Type of Residence: Private residence Insurance Resources:  Self-pay Multimedia programmer Medicare) Financial Screen Referred: No Living Expenses: Own Money Management: Patient Does the patient have any problems obtaining your medications?: No Home Management: Independent Patient/Family Preliminary Plans: Family able to assist at d/c. Care Coordinator Barriers to Discharge: Insurance for SNF coverage Care Coordinator Anticipated Follow Up Needs: HH/OP Expected length of stay: 7-10 Days  Clinical Impression Sw spoke with pt's niece, [name] on 9/12. Sw introduced self and explained role. Niece will serve as primary caregiver to the patient. Nephew, [name]  and friend, [name] to assist. Niece expressed that pt's brother, [name] who he served primary caregiver to is currently at home on hospice. (Pt's brother, [name] prior admission on CIR and sent home with hospice). Niece expressed that pt's nephew lives directly next door. SW expressed that patient will require 24/7 supervision at discharge. Niece aware and shares that a family member will be present daily to supervision patient.    Pt's brother is transitioning and patient is concerned about being unable to attend the service. Patient will be unable to attend the service and return to CIR. Sw provided cell phone number to niece so family can keep SW aware of any changes with pt's brother. If pt's brother transitions while on CIR SW will assist pt and family with a safe d/c plan while still allowing him to be present for the service.     Patient using HH prior. 1 level home, ramp entrance. SW informed niece that SW will have updated conference information next week with a potential discharge date. No additional questions or concerns.  [name] 02/20/2023, 10:32 AM

## 2023-02-20 NOTE — Discharge Summary (Signed)
Physician Discharge Summary  Patient ID: Shane Holloway MRN: [id number] DOB/AGE: 01/12/1951 72 y.o.  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1    CBC: Recent Labs  Lab 02/17/23 0739 02/18/23 0546 02/19/23 0705  WBC 5.8 6.4 7.1  NEUTROABS  --   --  4.6  HGB 12.5* 12.3* 13.5  HCT 37.4* 37.6* 40.4  MCV 87.4 86.2 87.1  PLT 115* 119* 134*    CBG: Recent Labs  Lab 02/19/23 1629 02/19/23 2039 02/19/23 2147 02/20/23 0624 02/20/23 1108  GLUCAP 145* 225* 164* 144* 147*    Brief HPI:   Shane Holloway is a 72 y.o. male presented to the [facility] ED on /9/04 with dizziness and N/V. He had been hospitalized from 01/24/2023 through 01/27/2023 with acute superior left perirolandic cortex infarct. Also diagnosed with acute left internal jugular and subclavian DVT placed on eliquis and aspirin. New MRI showed acute to subacute strokes. Consulted  Dr. Amada Jupiter from neurology as well as general surgery. Highly concerning for central embolic source.  He had both a transcranial Doppler as well as an echo with bubble which did not demonstrate any right-to-left shunt either cardiac or otherwise.  He did not have vascular imaging of the aorta or proximal great vessels because of his elevated creatinine, and Dr. Amada Jupiter ordered MRA without contrast of the head and neck. CT ab/pel showed dilated ileal small bowel segments in the right upper to mid abdomen with collapsed mid/lower small bowel consistent with intermediate to high-grade SBO; exact transition point was not found; etiology could be enteritis, occult adhesions or occult internal hernia. NGT placed. Etiology of stroke uncontrolled hypertension +/- suspected Afib or paradoxical emboli. 2D Echo (8/17): No intracardiac source of embolism detected. LDL 2. HgbA1c 8.8 VTE prophylaxis - IV Heparin drip, DVTs, holding home eliquis, per surgery. NGT removed 9/06 and started on clear liquids. VAS [place] Transcranial bubble doppler 8/17: negative. MRA of head and neck unremarkable. Transitioned from heparin to Eliquis 5 BID. 30 day monitor recommended. He tolerated exercises well but needs cues to initiate intermittently, inconsistently following single step commands.    Hospital Course: Shane Holloway was admitted to rehab 02/18/2023 for inpatient therapies to consist of PT, ST and OT at least three hours five days a week. Past admission physiatrist, therapy team and rehab RN have worked together to provide customized collaborative inpatient rehab.  Follow-up labs 9/11 with mild improvement in  serum creatinine to 2.57 and unchanged BUN at 37.  Serum magnesium 2.1.  Mild hypoalbuminemia, normal vitamin D level.  CBC within normal limits except thrombocytopenia which is improved to134,000.  Grounds past approved 9/12 to visit with his brother under hospice care who was transitioning. Developed dry cough with  congestion on 9/13.    Blood pressure/CHF were monitored on TID basis and  Entresto held, metoprolol 50 mg daily and Pacerone 200 mg daily continued.  Levemir increased to 10 units on 9/12.  Diabetes has been monitored with ac/hs CBG checks and SSI was use prn for tighter BS control. Jardiance 10 mg daily, SSI and Levemir 8 units q HS continued.   Rehab course: During patient's stay in rehab weekly team conferences were held to monitor patient's progress, set goals and discuss barriers to discharge. At admission, patient required min with basic self-care skills and with mobility  He has had improvement in activity tolerance, balance, postural control as well as ability to compensate for deficits. He/She has had improvement in functional use RUE/LUE  and RLE/LLE as well as improvement in awareness       Disposition:  There are no questions and answers to display.         Diet:  Special Instructions:   Allergies as of 02/20/2023       Reactions   Altace [ramipril] Cough   Shellfish Allergy Nausea And Vomiting   Sulfa Antibiotics Hives     Med Rec must be completed prior to using this SMARTLINK***        Signed: Milinda Antis 02/20/2023, 3:20 PM

## 2023-02-20 NOTE — Progress Notes (Signed)
PROGRESS NOTE   No new issues. Frustrated that left foot is still weak. Reports that his brother is in hospice  ROS: Patient denies fever, rash, sore throat, blurred vision, dizziness, nausea, vomiting, diarrhea, cough, shortness of breath or chest pain, joint or back/neck pain, headache, or mood change.       Objective:   No results found. Recent Labs    02/18/23 0546 02/19/23 0705  WBC 6.4 7.1  HGB 12.3* 13.5  HCT 37.6* 40.4  PLT 119* 134*   Recent Labs    02/18/23 0546 02/19/23 0705  NA 137 138  K 3.7 3.8  CL 106 106  CO2 23 21*  GLUCOSE 150* 127*  BUN 37* 37*  CREATININE 2.97* 2.57*  CALCIUM 8.4* 9.1    Intake/Output Summary (Last 24 hours) at 02/20/2023 1003 Last data filed at 02/20/2023 0737 Gross per 24 hour  Intake 1060 ml  Output 2075 ml  Net -1015 ml        Physical Exam: Vital Signs Blood pressure (!) 147/94, pulse 62, temperature 98.6 F (37 C), temperature source Oral, resp. rate 16, height 6\' 2"  (1.88 m), weight 98.7 kg, SpO2 92%.   Constitutional: No distress . Vital signs reviewed. HEENT: NCAT, EOMI, oral membranes moist Neck: supple Cardiovascular: RRR without murmur. No JVD    Respiratory/Chest: CTA Bilaterally without wheezes or rales. Normal effort    GI/Abdomen: BS +, non-tender, non-distended Ext: no clubbing, cyanosis, or edema Psych: pleasant and cooperative  Skin: No evidence of breakdown, no evidence of rash Pt oriented to person place day of week but not month or date Neurologic: Cranial nerves II through XII intact, motor strength is 5/5 in Left and 4/5 RIght deltoid, bicep, tricep, grip, hip flexor, knee extensors, ankle dorsiflexor and plantar flexor Sensory exam unable to eval due to word finding issues   Musculoskeletal: Full range of motion in all 4 extremities. No joint swelling    Assessment/Plan: 1. Functional deficits which require 3+ hours per day of  interdisciplinary therapy in a comprehensive inpatient rehab setting. Physiatrist is providing close team supervision and 24 hour management of active medical problems listed below. Physiatrist and rehab team continue to assess barriers to discharge/monitor patient progress toward functional and medical goals  Care Tool:  Bathing    Body parts bathed by patient: Right arm, Left arm, Chest, Abdomen, Front perineal area, Buttocks, Right upper leg, Face, Left lower leg, Right lower leg, Left upper leg         Bathing assist Assist Level: Supervision/Verbal cueing     Upper Body Dressing/Undressing Upper body dressing   What is the patient wearing?: Pull over shirt    Upper body assist Assist Level: Set up assist    Lower Body Dressing/Undressing Lower body dressing      What is the patient wearing?: Underwear/pull up, Pants     Lower body assist Assist for lower body dressing: Minimal Assistance - Patient > 75%     Toileting Toileting    Toileting assist Assist for toileting: Minimal Assistance - Patient > 75% Assistive Device Comment: toilet   Transfers Chair/bed transfer  Transfers assist  Chair/bed transfer activity did not  occur: Refused  Chair/bed transfer assist level: Minimal Assistance - Patient > 75%     Locomotion Ambulation   Ambulation assist      Assist level: Minimal Assistance - Patient > 75% Assistive device: Walker-rolling Max distance: 222ft   Walk 10 feet activity   Assist     Assist level: Minimal Assistance - Patient > 75% Assistive device: Walker-rolling   Walk 50 feet activity   Assist    Assist level: Minimal Assistance - Patient > 75% Assistive device: Walker-rolling    Walk 150 feet activity   Assist    Assist level: Minimal Assistance - Patient > 75% Assistive device: Walker-rolling    Walk 10 feet on uneven surface  activity   Assist     Assist level: Minimal Assistance - Patient > 75% Assistive  device: Walker-rolling   Wheelchair     Assist Is the patient using a wheelchair?: No             Wheelchair 50 feet with 2 turns activity    Assist            Wheelchair 150 feet activity     Assist          Blood pressure (!) 147/94, pulse 62, temperature 98.6 F (37 C), temperature source Oral, resp. rate 16, height 6\' 2"  (1.88 m), weight 98.7 kg, SpO2 92%.  Medical Problem List and Plan: 1. Functional deficits secondary to L posterior cerebral and R frontal lobe              -patient may  shower             -ELOS/Goals: t  ELOS~2wks, lives with brother who is in home hospice but has nephew who can assist    -Continue CIR therapies including PT, OT, and SLP -offered pt chaplain services if he would like to speak with them  2.  Antithrombotics: -DVT/anticoagulation:  Pharmaceutical: Eliquis             -antiplatelet therapy: Aspirin 81 mg daily   3. Pain Management: Tylenol as needed   4. Mood/Behavior/Sleep: LCSW to evaluate and provide emotional support             -antipsychotic agents: n/a   5. Neuropsych/cognition: This patient is? capable of making decisions on his own behalf.   6. Skin/Wound Care: Routine skin care checks   7. Fluids/Electrolytes/Nutrition: Routine Is and Os and follow-up chemistries   8: Hypertension: monitor TID and prn             -  Entresto held d/t orthostasis             -continue metoprolol 50 mg daily             -continue Pacerone 200 mg daily   9/12 -may have to tolerate a little higher bp's given orthostasis 9: Hyperlipidemia: continue statin, Zetia   10: Previous AVR on Eliquis   11: Acute left internal jugular and subclavian DVT on Eliquis 5 mg BID   12: DM-2: CBGs q AC and q HS; (home regimen includes NPH 12 units [place] daily with breakfast, Jardiance, semaglutide)             -continue Jardiance 10 mg daily             -continue SSI             -continue Levemir 8 units q HS  -9/12 fair control until  yesterday--will increase levemir  to 10u             CBG (last 3)  Recent Labs    02/19/23 2039 02/19/23 2147 02/20/23 0624  GLUCAP 225* 164* 144*    13: BPH: continue Flomax and finasteride   14: Chronic systolic/diastolic CHF: EF = 55-60%; not on duretic at home             -daily weight             -continue Pacerone 200 mg daily             -continue metoprolol succ 50 mg daily -Entresto held until orthostasis resolves   15: CKD stage IV: baseline ~2.5-3.0             -follow-up BMP at baseline   16: History of SVT: on BB   17: Orthostatic hypotension: monitor with increased mobility             -TED hose and abd binder, feet dangling prior to activity  -encourage appropriate fluid intake             -Entresto held   18: History of CAD, CABG, stents on statin, BB   19. Small bowel obstruction- has resolved, but at risk   20.  Mild thrombocytopenia cont to monitor    LOS: 2 days A FACE TO FACE EVALUATION WAS PERFORMED  Ranelle Oyster 02/20/2023, 10:03 AM

## 2023-02-20 NOTE — Progress Notes (Signed)
Speech Language Pathology Daily Session Note  Patient Details  Name: Shane Holloway MRN: [id number] Date of Birth: Apr 07, 1951  Today's Date: 02/20/2023 SLP Individual Time: 1100-1200 SLP Individual Time Calculation (min): 60 min  Short Term Goals: Week 1: SLP Short Term Goal 1 (Week 1): STGs= LTGs due to ELOS  Skilled Therapeutic Interventions: Skilled therapy session focused on cognitive goals. SLP introduced self and patient provided basic biological information about self. Patient oriented to self/situation/location independently but required modA to orient to time using calendar. SLP reviewed memory compensatory strategies (write, repeat, associate, picture) and prompted patient to identify examples of each. Patient recalled appropriate answers with supervision A. SLP aided in beginning creation of memory booklet including information regarding patients diagnosis, unit name, and room number. Patient able to identify diagnosis and recall deficits with minA, however required increased cues to name department and room number. Patient left in bed with alarm set and call bell in reach. Continue POC.    Pain No reports of pain during session  Therapy/Group: Individual Therapy  Maxfield Gildersleeve M.A., CF-SLP 02/20/2023, 12:27 PM

## 2023-02-20 NOTE — Progress Notes (Signed)
Physical Therapy Session Note  Patient Details  Name: Shane Holloway MRN: [id number] Date of Birth: 05/20/1951  Today's Date: 02/20/2023 PT Individual Time: 1010 - 1050 PT Individual Time Calculation (min): 40 min  PT Individual Time: 1410-1510 PT Individual Time Calculation (min): 60 min   Short Term Goals: Week 1:  PT Short Term Goal 1 (Week 1): STG = LTG due to ELOS  SESSION 1 Skilled Therapeutic Interventions/Progress Updates: Patient supine in bed on entrance to room. Patient alert and agreeable to PT session.   Patient reported no pain or issues this morning.   Therapeutic Activity: Bed Mobility: Pt performed supine<>sit on EOB with supervision. Transfers: Pt performed sit<>stand transfers throughout session with CGA and increased effort/time. Provided VC for pt to push one one UE and to have the other on the RW vs B UE on RW. Pt also cued to reach back to surface for controlled descent, and to keep RW in safe proximity vs outstretched in front.   Gait Training:  Pt ambulated 50' x 1 using RW with light min/CGA for safety. Pt presented with forward flexed posture and mild-moderate R foot drop into supination throughout with VC to bring pinky toe outward, and toes up to chest. Pt able to do so, but unable to maintain. Pt then ambulated 50' x 2 with 2lb ankle weight with [place] to dorsum of R personal shoe for error augmentation with noted increase in pronation/dorsiflexion. Pt also required cues to extend through hips and to move R ankle as if trying to bring R pinky toe towards leg of R for visual target to promote pronation.   Neuromuscular Re-ed: NMR facilitated during session with focus on R musculature control and neural connection to carry over to functional ambulation. - 2 x 15 DF + pronation on R foot with 2lb ankle weight on dorsum. Pt required multimodal cues to understand R ankle motion of pronation and DF, and to control eccentric.  NMR performed for improvements in  motor control and coordination, balance, sequencing, judgement, and self confidence/ efficacy in performing all aspects of mobility at highest level of independence.   Patient supine in bed at end of session with brakes locked, bed alarm set, and all needs within reach.  SESSION 2 Skilled Therapeutic Interventions/Progress Updates: Patient supine in bed on entrance to room. Patient alert and agreeable to PT session.   Patient reported no pain or change of status since previous session this morning.  PTA discussed with LCSW that pt's brother is transitioning in hospice care, and that pt has been approved to leave inpatient rehab for an allotted amount of time to give pt final moments with family. Pt's niece arrived to PT session to participate in family education to ensure pt's safety plan is understood. PTA instructed that pt is to be at [facility] level, and is only to use RW when transferring from WC<>SUV for pt's safety due to R foot drop into supination. Pt's brother's house is WC accessible with a ramp at the entrance, and pt has a WC at home that the niece is picking up prior to both the pt and niece arriving at the brother's location.   Pt's niece understood pt's current level of assistance, and to remain in [facility] unless transferring. Pt's niece also understood to bring pt back to inpatient rehabilitation at [facility] before by 6 p.m as that is when pt is scheduled to have next insulin shot. Pt's niece did not have any further questions or concerns  and is confident in maintaining pt's safety while away from inpatient stay.   Therapeutic Activity: Bed Mobility: Pt performed supine<>sit on EOB with supervision. Transfers: Pt performed sit<>stand transfers throughout session with CGA/close supervision. Provided VC for pt to reach back to surface when sitting, and to maintain safe proximity of WC.  - Pt standing in RW to doff/don personal pants and underwear with close supervision/CGA for safety  prior to leaving to see brother. Pt also stood in RW to void bladder with supervision and no LOB throughout. Pt transported to ortho gym dependently for time management.   Car Transfer: - Pt in ortho gym practicing car transfers while waiting on niece's arrival. Pt performed WC<>car x 3 with pt requiring CGA/supervision for safety and VC for hand placement on top rail to mimic roof handle in SUV. Pt with VC to ensure B LE are within RW prior to stepping off of car seat. Pt demonstrated ability to navigate B LE in and out of door without assistance.  Conservator, museum/gallery + Family Education: - Pt's niece arrived and pt performed same transfer with niece demonstrating transfer with PTA providing cues prn (hand placement on gait belt in supination for adding strength vs pronation; walking as pt walks from car entrance as to avoid ending up behind pt when pt needs to sit in WC. PTA demonstrated sequence, and pt's niece performed transfer again with supervision.  - Pt was transported to main entrance of hospital where pt's niece's car was located. Pt transferred from [facility] to SUV with PTA providing CGA. Pt stepped (with back turned to seat) L LE onto lower step, and then cued to use handle on the side of interior between window and car door to assist in balance to bring R LE onto step, and then to elevate self into seat. Pt exited SUV with CGA for safety, and then niece performed same task with CGA and no issue (niece recalled place one UE on gait belt, and the other at the anterior deltoids for safety.       Therapy Documentation Precautions:  Precautions Precautions: Fall Restrictions Weight Bearing Restrictions: No   Therapy/Group: Individual Therapy  Spruha Weight PTA 02/20/2023, 4:18 PM

## 2023-02-21 DIAGNOSIS — I63532 Cerebral infarction due to unspecified occlusion or stenosis of left posterior cerebral artery: Secondary | ICD-10-CM | POA: Diagnosis not present

## 2023-02-21 LAB — GLUCOSE, CAPILLARY
Glucose-Capillary: 161 mg/dL — ABNORMAL HIGH (ref 70–99)
Glucose-Capillary: 163 mg/dL — ABNORMAL HIGH (ref 70–99)
Glucose-Capillary: 156 mg/dL — ABNORMAL HIGH (ref 70–99)
Glucose-Capillary: 182 mg/dL — ABNORMAL HIGH (ref 70–99)

## 2023-02-21 MED ORDER — GUAIFENESIN-DM 100-10 MG/5ML PO SYRP
10.0000 mL | ORAL_SOLUTION | ORAL | Status: DC | PRN
  Administered 2023-02-21: 10 mL via ORAL
  Filled 2023-02-21: qty 10

## 2023-02-21 MED ORDER — BENZONATATE 100 MG PO CAPS
100.0000 mg | ORAL_CAPSULE | Freq: Three times a day (TID) | ORAL | Status: DC
  Administered 2023-02-21 – 2023-02-25 (×13): 100 mg via ORAL
  Filled 2023-02-21 (×13): qty 1

## 2023-02-21 MED ORDER — FLUTICASONE PROPIONATE 50 MCG/ACT NA SUSP
1.0000 | Freq: Every day | NASAL | Status: DC
  Administered 2023-02-21 – 2023-02-24 (×4): 1 via NASAL
  Filled 2023-02-21: qty 16

## 2023-02-21 NOTE — Progress Notes (Signed)
PROGRESS NOTE   Pt reports new onset of cough- no other Sx's- not productive- no astham or COPD.  Also reports no allergies Is Afebrile.   LBM 9/9- given Sorbitol this AM but no results yet.  ROS:   Pt denies SOB, abd pain, CP, N/V/C/D, and vision changes    Objective:   No results found. Recent Labs    02/19/23 0705  WBC 7.1  HGB 13.5  HCT 40.4  PLT 134*   Recent Labs    02/19/23 0705  NA 138  K 3.8  CL 106  CO2 21*  GLUCOSE 127*  BUN 37*  CREATININE 2.57*  CALCIUM 9.1    Intake/Output Summary (Last 24 hours) at 02/21/2023 0843 Last data filed at 02/21/2023 0733 Gross per 24 hour  Intake 720 ml  Output 1950 ml  Net -1230 ml        Physical Exam: Vital Signs Blood pressure 124/78, pulse 66, temperature 98 F (36.7 C), resp. rate 18, height 6\' 2"  (1.88 m), weight 98.5 kg, SpO2 95%.    General: awake, alert, appropriate, supine in bed; NAD HENT: conjugate gaze; oropharynx moist- sounds a little sniffy/stuffy CV: regular rate; no JVD Pulmonary: sounds clear to auscultation, however has nonproductive raspy cough  GI: soft, NT, ND, (+)BS Psychiatric: appropriate- flat Neurological: alert Psych: pleasant and cooperative  Skin: No evidence of breakdown, no evidence of rash Pt oriented to person place day of week but not month or date Neurologic: Cranial nerves II through XII intact, motor strength is 5/5 in Left and 4/5 RIght deltoid, bicep, tricep, grip, hip flexor, knee extensors, ankle dorsiflexor and plantar flexor Sensory exam unable to eval due to word finding issues   Musculoskeletal: Full range of motion in all 4 extremities. No joint swelling    Assessment/Plan: 1. Functional deficits which require 3+ hours per day of interdisciplinary therapy in a comprehensive inpatient rehab setting. Physiatrist is providing close team supervision and 24 hour management of active medical problems  listed below. Physiatrist and rehab team continue to assess barriers to discharge/monitor patient progress toward functional and medical goals  Care Tool:  Bathing    Body parts bathed by patient: Right arm, Left arm, Chest, Abdomen, Front perineal area, Buttocks, Right upper leg, Face, Left lower leg, Right lower leg, Left upper leg         Bathing assist Assist Level: Supervision/Verbal cueing     Upper Body Dressing/Undressing Upper body dressing   What is the patient wearing?: Pull over shirt    Upper body assist Assist Level: Set up assist    Lower Body Dressing/Undressing Lower body dressing      What is the patient wearing?: Underwear/pull up, Pants     Lower body assist Assist for lower body dressing: Minimal Assistance - Patient > 75%     Toileting Toileting    Toileting assist Assist for toileting: Minimal Assistance - Patient > 75% Assistive Device Comment: toilet   Transfers Chair/bed transfer  Transfers assist  Chair/bed transfer activity did not occur: Refused  Chair/bed transfer assist level: Minimal Assistance - Patient > 75%     Locomotion Ambulation   Ambulation assist  Assist level: Minimal Assistance - Patient > 75% Assistive device: Walker-rolling Max distance: 236ft   Walk 10 feet activity   Assist     Assist level: Minimal Assistance - Patient > 75% Assistive device: Walker-rolling   Walk 50 feet activity   Assist    Assist level: Minimal Assistance - Patient > 75% Assistive device: Walker-rolling    Walk 150 feet activity   Assist    Assist level: Minimal Assistance - Patient > 75% Assistive device: Walker-rolling    Walk 10 feet on uneven surface  activity   Assist     Assist level: Minimal Assistance - Patient > 75% Assistive device: Walker-rolling   Wheelchair     Assist Is the patient using a wheelchair?: No             Wheelchair 50 feet with 2 turns activity    Assist             Wheelchair 150 feet activity     Assist          Blood pressure 124/78, pulse 66, temperature 98 F (36.7 C), resp. rate 18, height 6\' 2"  (1.88 m), weight 98.5 kg, SpO2 95%.  Medical Problem List and Plan: 1. Functional deficits secondary to L posterior cerebral and R frontal lobe              -patient may  shower             -ELOS/Goals: t  ELOS~2wks, lives with brother who is in home hospice but has nephew who can assist    Con't CIR PT, OT and SLP -offered pt chaplain services if he would like to speak with them  2.  Antithrombotics: -DVT/anticoagulation:  Pharmaceutical: Eliquis             -antiplatelet therapy: Aspirin 81 mg daily   3. Pain Management: Tylenol as needed   4. Mood/Behavior/Sleep: LCSW to evaluate and provide emotional support             -antipsychotic agents: n/a   5. Neuropsych/cognition: This patient is? capable of making decisions on his own behalf.   6. Skin/Wound Care: Routine skin care checks   7. 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   11: Acute left internal jugular and subclavian DVT on Eliquis 5 mg BID   12: DM-2: CBGs q AC and q HS; (home regimen includes NPH 12 units So-Hi daily with breakfast, Jardiance, semaglutide)             -continue Jardiance 10 mg daily             -continue SSI             -continue Levemir 8 units q HS  -9/12 fair control until yesterday--will increase levemir to 10u  9/13- CBGs running 160s-250s- just got levemir increase- will monitor for trend and make changes over weekend if needed  CBG  (last 3)  Recent Labs    02/20/23 1820 02/20/23 2036 02/21/23 0555  GLUCAP 159* 256* 161*    13: BPH: continue Flomax and finasteride   14: Chronic systolic/diastolic CHF: EF = 55-60%; not on duretic at home             -daily weight             -continue Pacerone 200 mg daily             -continue metoprolol succ 50 mg daily -Entresto held until orthostasis resolves   9/13- Weight stable 98.5 kg- will con't to monitor 15: CKD stage IV: baseline ~2.5-3.0             -follow-up BMP at baseline   16: History of SVT: on BB   17: Orthostatic hypotension: monitor with increased mobility             -TED hose and abd binder, feet dangling prior to activity  -encourage appropriate fluid intake             -Entresto held   18: History of CAD, CABG, stents on statin, BB   19. Small bowel obstruction- has resolved, but at risk   9/13- LBM 9/9- given sorbitol this AM- if no results, will order Soap suds enema since had SBO, want to avoid again.   20.  Mild thrombocytopenia cont to monitor   21. New cough/stuffy nose  9/13- doesn't have Asthma or COPD- afebrile- just a little cough/sniffy- will add Flonase, Tessalon TID 100 mg and Robitussin prn- d/w nursing about plans- and if Sx's get worse at all, need further work up.    I spent a total of 51   minutes on total care today- >50% coordination of care- due to  D/w nursing x2 about overnight issues as well as coughing- also prolonged d/w pt- and IPOC and dealing with new cough.   LOS: 3 days A FACE TO FACE EVALUATION WAS PERFORMED  Cree Kunert 02/21/2023, 8:43 AM

## 2023-02-21 NOTE — Progress Notes (Signed)
Occupational Therapy Session Note  Patient Details  Name: Shane Holloway MRN: [id number] Date of Birth: [birth year]  Today's Date: 02/21/2023 OT Individual Time: 1301-1400 OT Individual Time Calculation (min): 59 min    Short Term Goals: Week 1:  OT Short Term Goal 1 (Week 1): STGs = LTGs  Skilled Therapeutic Interventions/Progress Updates:   Pt seen for skilled OT services with focus on R sided NMRE and safety for full shower and self care with mobility re-training. Pt reported his brother Shane Holloway, known to this clinician from previous admission is in his last 24 hrs of life and went to visit for the last time from rehab yesterday. Was fatigued but no pain and agreeable to full shower. Pt having some difficulty managing urinal with clothing therefore OT addressed for continence and spillage prevention. Noted pt with R anterior foot abrsaion pt reprted was from use of foot up brace without socks. Secure chat to RN and PT to note. May need foam pad or stockinette for protection. S supine to sit, sit to and from standing with close S, amb with RW with CGA to and from bed with min cues for safety and R sided graded control. Seated shower level on bench with S for UB bathing and CGA LB bathing incl standing at grab bar for buttocks. Dressing from edge of shower bench with increased time and effort for pullover shirt and deodorant with set up and close S and min A fading to CGA for pull on LB garments including slipper socks. Pt returned to bed at end of session with all safety needs, nursing call button and bed alarm active.   Pain: no pain reported   Therapy Documentation Precautions:  Precautions Precautions: Fall Restrictions Weight Bearing Restrictions: No  Therapy/Group: Individual Therapy  Vicenta Dunning 02/21/2023, 7:41 AM

## 2023-02-21 NOTE — Progress Notes (Signed)
Pt have BM, enema not given.Marland Kitchen additional sorbitol given per MD lovorn        02/21/23 1030  Unmeasured Output  Urine Occurrence 1  Stool Occurrence 1  Urine Characteristics  Urinary Incontinence No  Urine Color Yellow/straw  Urine Appearance Clear  Hygiene Peri care  Stool Characteristics  Bowel Incontinence No  Stool Type Type 5 (Soft blobs with clear-cut edges)  Stool Descriptors Brown  Stool Amount Medium  Stool Source Rectum

## 2023-02-21 NOTE — Progress Notes (Signed)
Physical Therapy Session Note  Patient Details  Name: Shane Holloway MRN: [id number] Date of Birth: 1951/02/15  Today's Date: 02/21/2023 PT Individual Time: 0805-0902 PT Individual Time Calculation (min): 57 min   Short Term Goals: Week 1:  PT Short Term Goal 1 (Week 1): STG = LTG due to ELOS  Skilled Therapeutic Interventions/Progress Updates:  Patient supine in bed with bed flat on entrance to room. Patient alert and agreeable to PT session. When asked relates that he does not need to toilet or change.   Patient with no pain complaint at start of session.  Therapeutic Activity: Bed Mobility: Pt performed supine > sit with supervision/ Mod I. No vc required for technique. Transfers: Pt performed sit<>stand and stand pivot transfers throughout session with close supervision. Provided vc for positioning of BUE to push-to-stand from seated surface as well as to reach back for armrests to assist with controlled descent throughout session. Pt improves with decreased need for vc in rise to stand.    Gait Training:  Pt ambulated 96 ft using RW with no foot up brace donned and CGA/ close supervision. Demonstrated weakening of R ankle everters with decreased foot clearance and increased inversion about halfway through bout. With vc for leading with "little toe" UP at toe off and then heel strike, pt is able to correct but requires consistent vc to maintain.  Foot Up brace donned to R foot and within first 20 ft pt relates that it is easier to ambulate. Directed pt to continue to focus on RLE and need to lead his R foot forward with little toe UP and then heel strike with improved performance throughout. Completes 40' x2 with seated rest break between bouts. Pt does not relate feeling brace being on during amb bouts.   With removal of brace after amb bouts, noted impressions in edema on RLE, but pt continues to endorse brace feeling good on foot/ ankle.   Patient supine in bed at end of session  with brakes locked, bed alarm set, and all needs within reach.   Therapy Documentation Precautions:  Precautions Precautions: Fall Restrictions Weight Bearing Restrictions: No  Pain:  No pain related this session.   Therapy/Group: Individual Therapy  Loel Dubonnet PT, DPT, CSRS 02/21/2023, 7:57 AM

## 2023-02-21 NOTE — Progress Notes (Signed)
Physical Therapy Session Note  Patient Details  Name: Shane Holloway MRN: [id number] Date of Birth: [birth year]  Today's Date: 02/21/2023 PT Individual Time: 1000-1048 PT Individual Time Calculation (min): 48 min   Short Term Goals: Week 1:  PT Short Term Goal 1 (Week 1): STG = LTG due to ELOS  Skilled Therapeutic Interventions/Progress Updates: Patient supine in bed on entrance to room. Patient alert and agreeable to PT session.   Patient reported no pain at beginning of PT session.   Therapeutic Activity: Bed Mobility: Pt performed supine<>sit on EOB with supervision for safety.  Transfers: Pt performed sit<>stand transfers throughout session with RW and with supervision. Provided VC for pt to reach back to surfaces to control descent, and to maintain safe proximity of RW. PT  PT stated midway throughout session that nsg provided laxatives this morning and needed to have a BM. Pt transported from main gym to room and ambualted from [facility] to toilet (roughly 10') with supervision and with noted foot drop into plantarflexion and supination. Pt on toilet (controlled descent) with door closed and self cleaned without assistance. Pt transported back to main gym for NMRE.   Neuromuscular Re-ed: NMR facilitated during session with focus on NMR on R foot drop. - Pt ambulated around main gym in RW with 5lbs (cylinder weights tied in [place] that was then wrapped around the lateral aspect of the dorsum of R foot. This was done to error augment pt's foot drop into planter flexion, and supination). Pt was educated that it would make it difficult to move into desired motion (DF + pronation, but that it will benefit to strengthen targeted musculature). Pt provided VC to bring 5th digit on R foot towards leg of RW to promote motion (pt slight compensation with R hip abduction, but still able to somewhat get ankle motion). - Pt ambulated from doorway in room in RW with supervision and without weight on R foot  and noted to have increase in DF and pronation.  NMR performed for improvements in motor control and coordination, balance, sequencing, judgement, and self confidence/ efficacy in performing all aspects of mobility at highest level of independence.   Patient supine in bed at end of session with brakes locked, bed alarm set, and all needs within reach.      Therapy Documentation Precautions:  Precautions Precautions: Fall Restrictions Weight Bearing Restrictions: No  Therapy/Group: Individual Therapy  Mora Pedraza PTA 02/21/2023, 12:26 PM

## 2023-02-21 NOTE — IPOC Note (Signed)
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 (HCC)     Functional Problem List: Nursing Bladder, Medication Management, Safety, Endurance  PT Balance, Endurance, Safety  OT Balance, Cognition, Endurance, Motor  SLP Cognition  TR         Basic ADL's: OT Bathing, Dressing, Toileting     Advanced  ADL's: OT Simple Meal Preparation     Transfers: PT Bed Mobility, Bed to Chair  OT Toilet, Tub/Shower     Locomotion: PT Ambulation, Stairs     Additional Impairments: OT None  SLP Social Cognition   Problem Solving, Memory, Attention  TR      Anticipated Outcomes Item Anticipated Outcome  Self Feeding no goal, pt is independent  Swallowing  Mod I   Basic self-care  Mod I  Toileting  Mod I   Bathroom Transfers Mod I to toilet, supervision to shower  Bowel/Bladder  manage bladder w mod I assist  Transfers  supervision  Locomotion  supervision  Communication     Cognition  Min A  Pain  n/a  Safety/Judgment  manage w cues   Therapy Plan: PT Intensity: Minimum of 1-2 x/day ,45 to 90 minutes PT Frequency: 5 out of 7 days PT Duration Estimated Length of Stay: 7-9 days OT Intensity: Minimum of 1-2 x/day, 45 to 90 minutes OT Frequency: 5 out of 7 days OT Duration/Estimated Length of Stay: 7-10 days SLP Intensity: Minumum of 1-2 x/day, 30 to 90 minutes SLP Frequency: 3 to 5 out of 7 days SLP Duration/Estimated Length of Stay: 7-10 days   Team Interventions: Nursing Interventions Disease Management/Prevention, Medication Management, Discharge Planning, Bladder Management, Patient/Family Education  PT interventions Ambulation/gait training, Warden/ranger, Cognitive remediation/compensation, Community  reintegration, Discharge planning, Disease management/prevention, DME/adaptive equipment instruction, Functional electrical stimulation, Functional mobility training, Neuromuscular re-education, Pain management, Patient/family education, Psychosocial support, Skin care/wound management, Splinting/orthotics, Stair training, Therapeutic Activities, Therapeutic Exercise, UE/LE Strength taining/ROM, UE/LE Coordination activities, Visual/perceptual remediation/compensation, Wheelchair propulsion/positioning  OT Interventions Warden/ranger, DME/adaptive equipment instruction, Discharge planning, Cognitive remediation/compensation, Functional mobility training, Neuromuscular re-education, Psychosocial support, Patient/family education, Self Care/advanced ADL retraining, UE/LE Strength taining/ROM, Therapeutic Exercise, Therapeutic Activities  SLP Interventions Cognitive remediation/compensation, Internal/external aids, Cueing hierarchy, Functional tasks, Patient/family education  TR Interventions    SW/CM Interventions Discharge Planning, Psychosocial Support, Disease Management/Prevention, Patient/Family Education   Barriers to Discharge MD  Medical stability, Home enviroment access/loayout, Neurogenic bowel and bladder, and Lack of/limited family support  Nursing Decreased caregiver support 1 level ramped entry solo; has a RW, SPC, W/C and Shower seat;went to rehab after d/c from hosptial last month and recently returned home with HHPT. Has been ambulating without assistive device and is independent with ADLs.  PT Decreased caregiver support, Home environment Best boy, Insurance for SNF coverage    OT Decreased caregiver support due to memory impairments, pt will most likely need 24/7 supervision  SLP Lack of/limited family support    SW Insurance for SNF coverage     Team Discharge Planning: Destination: PT-Home ,OT- Home , SLP-Home Projected Follow-up: PT-Home health PT, 24 hour  supervision/assistance, OT-  Home health OT, SLP-Home Health SLP, Outpatient SLP, 24 hour supervision/assistance Projected Equipment Needs: PT-To be determined, OT- None recommended by OT, SLP-To be determined Equipment Details: PT- , OT-  Patient/family involved in discharge planning:  PT- Patient,  OT-Patient, SLP-Patient  MD ELOS: 7-10 days  Medical Rehab Prognosis:  Good Assessment: The patient has been admitted for CIR therapies with the diagnosis of L posterior cerebral artery stroke. The team will be addressing functional mobility, strength, stamina, balance, safety, adaptive techniques and equipment, self-care, bowel and bladder mgt, patient and caregiver education, . Goals have been set at mod I to supervision. Anticipated discharge destination is home with brother/nephew.        See Team Conference Notes for weekly updates to the plan of care

## 2023-02-21 NOTE — Progress Notes (Signed)
Speech Language Pathology Daily Session Note  Patient Details  Name: Shane Holloway MRN: [id number] Date of Birth: 12-02-1950  Today's Date: 02/21/2023 SLP Individual Time: 1446-1530 SLP Individual Time Calculation (min): 44 min  Short Term Goals: Week 1: SLP Short Term Goal 1 (Week 1): STGs= LTGs due to ELOS  Skilled Therapeutic Interventions: Skilled therapy session focused on cognitive goals, specifically memory. SLP faciliated session by providing modA during matching (month to holiday, holiday to reason for celebration). Initially, patient scored 50% accuracy however after education and time delay patient was able to match holiday and month with 75% accuracy and explain reasoning for each major holiday with 100% accuracy. Patient unable to recall memory strategies, therefore re-educated (WRAP: write, repeat, associate, picture). SLP aided in addition to memory book to include hobbies and favorites. Patient benefited from options for food items, however able to independently name favorite drinks, color and hobbies. At the end of the session, SLP provided modA during matching of race flags and meanings (patient is motivated by racing topic). Patient left in bed with alarm set and call bell in reach. Continue POC.   Pain No pain reported   Therapy/Group: Individual Therapy  Carime Dinkel M.A., CF-SLP 02/21/2023, 3:41 PM

## 2023-02-21 NOTE — Plan of Care (Signed)
  Problem: Consults Goal: RH STROKE PATIENT EDUCATION Description: See Patient Education module for education specifics  Outcome: Progressing   Problem: RH BLADDER ELIMINATION Goal: RH STG MANAGE BLADDER WITH ASSISTANCE Description: STG Manage Bladder With mod I Assistance Outcome: Progressing Goal: RH STG MANAGE BLADDER WITH MEDICATION WITH ASSISTANCE Description: STG Manage Bladder With Medication With mod I  Assistance. Outcome: Progressing   Problem: RH SAFETY Goal: RH STG ADHERE TO SAFETY PRECAUTIONS W/ASSISTANCE/DEVICE Description: STG Adhere to Safety Precautions With cues Assistance/Device. Outcome: Progressing   Problem: RH KNOWLEDGE DEFICIT Goal: RH STG INCREASE KNOWLEDGE OF DIABETES Description: Patient will be able to manage DM with medications and dietary modifications using educational resources independently Outcome: Progressing Goal: RH STG INCREASE KNOWLEDGE OF HYPERTENSION Description: Patient will be able to manage HTN with medications and dietary modifications using educational resources independently Outcome: Progressing Goal: RH STG INCREASE KNOWLEGDE OF HYPERLIPIDEMIA Description: Patient will be able to manage HLD with medications and dietary modifications using educational resources independently Outcome: Progressing Goal: RH STG INCREASE KNOWLEDGE OF STROKE PROPHYLAXIS Description: Patient will be able to manage secondary risks with medications and dietary modifications using educational resources independently Outcome: Progressing

## 2023-02-22 DIAGNOSIS — I63532 Cerebral infarction due to unspecified occlusion or stenosis of left posterior cerebral artery: Secondary | ICD-10-CM | POA: Diagnosis not present

## 2023-02-22 LAB — GLUCOSE, CAPILLARY
Glucose-Capillary: 138 mg/dL — ABNORMAL HIGH (ref 70–99)
Glucose-Capillary: 129 mg/dL — ABNORMAL HIGH (ref 70–99)
Glucose-Capillary: 135 mg/dL — ABNORMAL HIGH (ref 70–99)
Glucose-Capillary: 136 mg/dL — ABNORMAL HIGH (ref 70–99)

## 2023-02-22 MED ORDER — SODIUM CHLORIDE 0.9 % IV SOLN: INTRAVENOUS | Status: DC

## 2023-02-22 NOTE — Progress Notes (Signed)
PROGRESS NOTE  Had a BM last night with assistance of sorbitol Patient's chart reviewed- No issues reported overnight Vitals signs stable except for elevated BP . ROS: Pt denie SOB, abd pain, CP, N/V/C/D, and vision changes    Objective:   No results found. No results for input(s): "WBC", "HGB", "HCT", "PLT" in the last 72 hours.  No results for input(s): "NA", "K", "CL", "CO2", "GLUCOSE", "BUN", "CREATININE", "CALCIUM" in the last 72 hours.   Intake/Output Summary (Last 24 hours) at 02/22/2023 0825 Last data filed at 02/22/2023 0755 Gross per 24 hour  Intake 718 ml  Output 1350 ml  Net -632 ml        Physical Exam: Vital Signs Blood pressure (!) 152/96, pulse 69, temperature 98 F (36.7 C), resp. rate 18, height 6\' 2"  (1.88 m), weight 93.3 kg, SpO2 97%. Gen: no distress, normal appearing HEENT: oral mucosa pink and moist, NCAT Cardio: Reg rate Chest: normal effort, normal rate of breathing Abd: soft, non-distended Ext: no edema Psych: pleasant, normal affect Pt oriented to person place day of week but not month or date Neurologic: Cranial nerves II through XII intact, motor strength is 5/5 in Left and 4/5 RIght deltoid, bicep, tricep, grip, hip flexor, knee extensors, ankle dorsiflexor and plantar flexor Sensory exam unable to eval due to word finding issues   Musculoskeletal: Full range of motion in all 4 extremities. No joint swelling    Assessment/Plan: 1. Functional deficits which require 3+ hours per day of interdisciplinary therapy in a comprehensive inpatient rehab setting. Physiatrist is providing close team supervision and 24 hour management of active medical problems listed below. Physiatrist and rehab team continue to assess barriers to discharge/monitor patient progress toward functional and medical goals  Care Tool:  Bathing    Body parts bathed by patient: Right arm, Left arm, Chest, Abdomen,  Front perineal area, Buttocks, Right upper leg, Face, Left lower leg, Right lower leg, Left upper leg         Bathing assist Assist Level: Supervision/Verbal cueing     Upper Body Dressing/Undressing Upper body dressing   What is the patient wearing?: Pull over shirt    Upper body assist Assist Level: Set up assist    Lower Body Dressing/Undressing Lower body dressing      What is the patient wearing?: Underwear/pull up, Pants     Lower body assist Assist for lower body dressing: Minimal Assistance - Patient > 75%     Toileting Toileting    Toileting assist Assist for toileting: Minimal Assistance - Patient > 75% Assistive Device Comment: toilet   Transfers Chair/bed transfer  Transfers assist  Chair/bed transfer activity did not occur: Refused  Chair/bed transfer assist level: Minimal Assistance - Patient > 75%     Locomotion Ambulation   Ambulation assist      Assist level: Minimal Assistance - Patient > 75% Assistive device: Walker-rolling Max distance: 211ft   Walk 10 feet activity   Assist     Assist level: Minimal Assistance - Patient > 75% Assistive device: Walker-rolling   Walk 50 feet activity   Assist    Assist level: Minimal Assistance - Patient > 75% Assistive  device: Walker-rolling    Walk 150 feet activity   Assist    Assist level: Minimal Assistance - Patient > 75% Assistive device: Walker-rolling    Walk 10 feet on uneven surface  activity   Assist     Assist level: Minimal Assistance - Patient > 75% Assistive device: Walker-rolling   Wheelchair     Assist Is the patient using a wheelchair?: No             Wheelchair 50 feet with 2 turns activity    Assist            Wheelchair 150 feet activity     Assist          Blood pressure (!) 152/96, pulse 69, temperature 98 F (36.7 C), resp. rate 18, height 6\' 2"  (1.88 m), weight 93.3 kg, SpO2 97%.  Medical Problem List and Plan: 1.  Functional deficits secondary to L posterior cerebral and R frontal lobe              -patient may  shower             -ELOS/Goals: t  ELOS~2wks, lives with brother who is in home hospice but has nephew who can assist    Con't CIR PT, OT and SLP -offered pt chaplain services if he would like to speak with them  2.  Antithrombotics: -DVT/anticoagulation:  Pharmaceutical: Eliquis             -antiplatelet therapy: Aspirin 81 mg daily   3. Pain Management: Tylenol as needed   4. Mood/Behavior/Sleep: LCSW to evaluate and provide emotional support             -antipsychotic agents: n/a   5. Neuropsych/cognition: This patient is? capable of making decisions on his own behalf.   6. Skin/Wound Care: Routine skin care checks   7. 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   11: Acute left internal jugular and subclavian DVT on Eliquis 5 mg BID   12: DM-2: CBGs q AC and q HS; (home regimen includes NPH 12 units [place] daily with breakfast, Jardiance, semaglutide)             -continue Jardiance 10 mg daily             -continue SSI             -continue Levemir 8 units q HS  -9/12 fair control until yesterday--will increase levemir to 10u  Provided list of foods for better blood sugar control             CBG (last 3)  Recent Labs    02/21/23 1647 02/21/23 2037 02/22/23 0624  GLUCAP 156* 182* 138*    13: BPH: continue Flomax and finasteride   14: Chronic systolic/diastolic CHF: EF = 55-60%; not on duretic at home             -daily weight, weight reviewed and is decreased             -  continue Pacerone 200 mg daily             -continue  metoprolol succ 50 mg daily -Entresto held until orthostasis resolves   9/13- Weight stable 98.5 kg- will con't to monitor 15: CKD stage IV: baseline ~2.5-3.0             -follow-up BMP at baseline   16: History of SVT: on BB   17: Orthostatic hypotension: monitor with increased mobility             -TED hose and abd binder, feet dangling prior to activity  -encourage appropriate fluid intake             -Entresto held   18: History of CAD, CABG, stents on statin, BB   19. Small bowel obstruction- has resolved, but at risk. Had BM 9/13 with sorbitol  20.  Mild thrombocytopenia cont to monitor   21. New cough/stuffy nose  Continue add Flonase, Tessalon TID 100 mg and Robitussin prn- d/w nursing about plans- and if Sx's get worse at all, need further work up.     LOS: 4 days A FACE TO FACE EVALUATION WAS PERFORMED  Shane Holloway Shane Holloway 02/22/2023, 8:25 AM

## 2023-02-22 NOTE — Progress Notes (Signed)
Occupational Therapy Session Note  Patient Details  Name: Shane Holloway MRN: [id number] Date of Birth: [birth year]  Today's Date: 02/22/2023 OT Individual Time: 0920-1020 OT Individual Time Calculation (min): 60 min  and Today's Date: 02/22/2023 OT Missed Time: 15 Minutes Missed Time Reason: Patient fatigue   Short Term Goals: Week 1:  OT Short Term Goal 1 (Week 1): STGs = LTGs  Skilled Therapeutic Interventions/Progress Updates:    Pt received in bed. His brother passed away last night.  Asked pt how he was feeling and he declined to shower but was willing to do some activity today.  -supervision bed mobility -declined shower but washed up upper body and perineal area/buttocks as pt's old clothing smelled (he is having difficulty using urinal).  Recommended he go to the toilet.   -ambulation to toilet to urinate. -close S with sit to stands and cues to push up from seat vs reaching for grab bar -close S to light CGA with ambulation in room and to toilet Pt sat in wc and transported to main gym. Pt transferred to mat using RW with close S.  On mat he worked on: -sitting balance with dynamic reaching outside BOS for trunk control  -controlled sit to stands -B overhead reaching with large ball Pt did feel fatigued and requested to go back to room to lay down.   Pt transported via w/c and then ambulated from door of room to bed with RW and close S.  Pt in bed with all needs met, bed alarm set.  Therapy Documentation Precautions:  Precautions Precautions: Fall Restrictions Weight Bearing Restrictions: No   Pain: Pain Assessment Pain Scale: 0-10 Pain Score: 0-No pain    Therapy/Group: Individual Therapy  Dick Hark 02/22/2023, 10:24 AM

## 2023-02-22 NOTE — Progress Notes (Signed)
Physical Therapy Session Note  Patient Details  Name: Shane Holloway MRN: [id number] Date of Birth: Sep 09, 1950  Today's Date: 02/22/2023 PT Individual Time: 1420-1530 PT Individual Time Calculation (min): 70 min   Short Term Goals: Week 1:  PT Short Term Goal 1 (Week 1): STG = LTG due to ELOS  Skilled Therapeutic Interventions/Progress Updates: Patient supine in bed on entrance to room. Patient alert and agreeable to PT session.   Patient reported no pain  at beginning of session. Pt reported of the transitioning of his brother this morning, and that pt's niece was coming to pick him up today stating that it was conversed with LCSW. Pt attempted to contact niece in order to see if there was a misunderstanding, but niece did not answer. PTA went to charge nurse, attending RN, and therapists that saw pt today to ask for clarification, but that was not communicated to them. Pt went through PT session until niece could be contacted. Pt was made aware of consequence of leaving inpatient rehab AMA (pt understood).  Niece reached out to pt at end of session and PTA conversed with niece that there was a misunderstanding, and that LCSW will know when the funeral is and will plan pt do d/c to allow time for attendance to the funeral. Pt in agreeance with decision.  Therapeutic Activity: Bed Mobility: Pt performed supine<>sit on EOB with modI. Transfers: Pt performed sit<>stand transfers throughout session with supervision for safety and VC to maintain safe proximity to RW. Pt transported to main gym for therapy. After first round of NMRE, pt stated needing to void urine. Pt transported dependently to room in [facility] and ambulated to toilet with CGA (weight still attached to R foot). Pt sat on toilet and voided urine with modI, and performed self care without assistance (donning/doffing pants and underwear). Pt required change in personal clothing due to soiled underwear (pt reported it came from using urinal  previously). PTA assisted pt in donning/doffing pants and underwear with modA for time management. Pt urine noted to be yellow and cloudy with RN being notified of strong smell and consistency. Pt transported back to main gym.   Neuromuscular Re-ed: NMR facilitated during session with focus on R foot coordination, control, neuromuscular connection and strength. - Pt ambulated around main gym (CGA) x 3 rounds with rest breaks in between in RW with 5lbs (cylinder weights tied in [place] that was then wrapped around the lateral aspect of the dorsum of R foot. This was done to error augment pt's foot drop into planter flexion, and supination). Pt provided VC to bring 5th digit on R foot towards leg of RW to promote motion (pt with less compensation with R hip abduction this session vs previous session. - Pt then ambulated around main gym without weight attached and noted to have increase in DF and pronation on R foot throughout entire ambulation (roughly 100'). Pt cued to extend through hips per forward flexed posture, and to look ahead vs downward gaze. Pt reported feeling a difference in R foot and that it felt "good."  NMR performed for improvements in motor control and coordination, balance, sequencing, judgement, and self confidence/ efficacy in performing all aspects of mobility at highest level of independence.   Patient supine in bed at end of session with brakes locked, bed alarm set, and all needs within reach.      Therapy Documentation Precautions:  Precautions Precautions: Fall Restrictions Weight Bearing Restrictions: No  Therapy/Group: Individual Therapy  Kveon Casanas Albertina [place]  PTA 02/22/2023, 3:56 PM

## 2023-02-22 NOTE — Plan of Care (Signed)
  Problem: Consults Goal: RH STROKE PATIENT EDUCATION Description: See Patient Education module for education specifics  Outcome: Progressing   Problem: RH BLADDER ELIMINATION Goal: RH STG MANAGE BLADDER WITH ASSISTANCE Description: STG Manage Bladder With mod I Assistance Outcome: Progressing Goal: RH STG MANAGE BLADDER WITH MEDICATION WITH ASSISTANCE Description: STG Manage Bladder With Medication With mod I  Assistance. Outcome: Progressing   Problem: RH SAFETY Goal: RH STG ADHERE TO SAFETY PRECAUTIONS W/ASSISTANCE/DEVICE Description: STG Adhere to Safety Precautions With cues Assistance/Device. Outcome: Progressing   Problem: RH KNOWLEDGE DEFICIT Goal: RH STG INCREASE KNOWLEDGE OF DIABETES Description: Patient will be able to manage DM with medications and dietary modifications using educational resources independently Outcome: Progressing Goal: RH STG INCREASE KNOWLEDGE OF HYPERTENSION Description: Patient will be able to manage HTN with medications and dietary modifications using educational resources independently Outcome: Progressing Goal: RH STG INCREASE KNOWLEGDE OF HYPERLIPIDEMIA Description: Patient will be able to manage HLD with medications and dietary modifications using educational resources independently Outcome: Progressing Goal: RH STG INCREASE KNOWLEDGE OF STROKE PROPHYLAXIS Description: Patient will be able to manage secondary risks with medications and dietary modifications using educational resources independently Outcome: Progressing

## 2023-02-22 NOTE — Progress Notes (Signed)
Speech Language Pathology Daily Session Note  Patient Details  Name: Shane Holloway MRN: [id number] Date of Birth: 1950-11-10  Today's Date: 02/22/2023 SLP Individual Time: 4098-1191 SLP Individual Time Calculation (min): 45 min  Short Term Goals: Week 1: SLP Short Term Goal 1 (Week 1): STGs= LTGs due to ELOS  Skilled Therapeutic Interventions: Skilled therapy session focused on orientation and problem solving goals.Upon entrance, patient informed SLP that his brother passed away last night. SLP provided emotional support. Patient then reported that he was willing to participate in therapy. SLP facilitated session by providing minA during orientation tasks. Patient able to independently orient to self and place, however demonstrated 70% accuracy during orientation to time with minA. Patient with progress this date during problem solving goals as he was able to independently recall reasoning for each racing flag color. To continue to address problem solving, SLP provided mod verbal A to aid in completion of money management tasks. Patient was asked to recall worth of coins and count amounts provided by SLP. Patient with difficulty differentiating between nickels and quarters. Patient continent of urine at the conclusion of session. Patient left in bed with alarm set and call bell in reach. Continue POC.    Pain Pain Assessment Pain Scale: 0-10 Pain Score: 0-No pain  Therapy/Group: Individual Therapy  [name] M.A., CF-SLP 02/22/2023, 12:09 PM

## 2023-02-22 NOTE — Progress Notes (Signed)
MD Raulkar made aware of pt concentrated cloudy urine and poor PO fluid intake regardless of encouragement. New orders received. Mylo Red, LPN

## 2023-02-23 DIAGNOSIS — I63532 Cerebral infarction due to unspecified occlusion or stenosis of left posterior cerebral artery: Secondary | ICD-10-CM | POA: Diagnosis not present

## 2023-02-23 LAB — URINALYSIS, MICROSCOPIC (REFLEX)
RBC / HPF: >50 RBC/hpf (ref 0–5)
WBC, UA: >50 WBC/hpf (ref 0–5)

## 2023-02-23 LAB — URINALYSIS, ROUTINE W REFLEX MICROSCOPIC
Bilirubin Urine: NEGATIVE
Glucose, UA: >=500 mg/dL — AB
Ketones, ur: NEGATIVE mg/dL
Nitrite: NEGATIVE
Protein, ur: 30 mg/dL — AB
Specific Gravity, Urine: 1.015 (ref 1.005–1.030)
pH: 5.5 (ref 5.0–8.0)

## 2023-02-23 LAB — GLUCOSE, CAPILLARY
Glucose-Capillary: 122 mg/dL — ABNORMAL HIGH (ref 70–99)
Glucose-Capillary: 152 mg/dL — ABNORMAL HIGH (ref 70–99)
Glucose-Capillary: 180 mg/dL — ABNORMAL HIGH (ref 70–99)
Glucose-Capillary: 306 mg/dL — ABNORMAL HIGH (ref 70–99)

## 2023-02-23 MED ORDER — CEPHALEXIN 250 MG PO CAPS
500.0000 mg | ORAL_CAPSULE | Freq: Two times a day (BID) | ORAL | Status: DC
  Administered 2023-02-23 – 2023-02-24 (×3): 500 mg via ORAL
  Filled 2023-02-23 (×3): qty 2

## 2023-02-23 MED ORDER — TAMSULOSIN HCL 0.4 MG PO CAPS
0.4000 mg | ORAL_CAPSULE | Freq: Every day | ORAL | Status: DC
  Administered 2023-02-23 – 2023-02-25 (×3): 0.4 mg via ORAL
  Filled 2023-02-23 (×3): qty 1

## 2023-02-23 NOTE — Progress Notes (Signed)
PROGRESS NOTE Nursing alerted me to cloudy urine yesterday and had concerns that patient was dehydrated, weight reviewed and was down significantly, fluids given overnight, weight up today, still less than baseline . ROS: Pt denies SOB, abd pain, CP, N/V/C/D, and vision changes, +urinary retention    Objective:   No results found. No results for input(s): "WBC", "HGB", "HCT", "PLT" in the last 72 hours.  No results for input(s): "NA", "K", "CL", "CO2", "GLUCOSE", "BUN", "CREATININE", "CALCIUM" in the last 72 hours.   Intake/Output Summary (Last 24 hours) at 02/23/2023 0844 Last data filed at 02/23/2023 0802 Gross per 24 hour  Intake 2140.96 ml  Output 1650 ml  Net 490.96 ml        Physical Exam: Vital Signs Blood pressure (!) 179/82, pulse 61, temperature 97.7 F (36.5 C), temperature source Oral, resp. rate 18, height 6\' 2"  (1.88 m), weight 96.3 kg, SpO2 98%. Gen: no distress, normal appearing HEENT: oral mucosa pink and moist, NCAT Cardio: Reg rate Chest: normal effort, normal rate of breathing Abd: soft, non-distended Ext: no edema Psych: pleasant, normal affect Pt oriented to person place day of week but not month or date Neurologic: Cranial nerves II through XII intact, motor strength is 5/5 in Left and 4/5 RIght deltoid, bicep, tricep, grip, hip flexor, knee extensors, ankle dorsiflexor and plantar flexor Sensory exam unable to eval due to word finding issues   Musculoskeletal: Full range of motion in all 4 extremities. No joint swelling   GU: cloudy urine   Assessment/Plan: 1. Functional deficits which require 3+ hours per day of interdisciplinary therapy in a comprehensive inpatient rehab setting. Physiatrist is providing close team supervision and 24 hour management of active medical problems listed below. Physiatrist and rehab team continue to assess barriers to discharge/monitor patient progress toward  functional and medical goals  Care Tool:  Bathing    Body parts bathed by patient: Right arm, Left arm, Chest, Abdomen, Front perineal area, Buttocks, Right upper leg, Face, Left lower leg, Right lower leg, Left upper leg         Bathing assist Assist Level: Supervision/Verbal cueing     Upper Body Dressing/Undressing Upper body dressing   What is the patient wearing?: Pull over shirt    Upper body assist Assist Level: Set up assist    Lower Body Dressing/Undressing Lower body dressing      What is the patient wearing?: Underwear/pull up, Pants     Lower body assist Assist for lower body dressing: Minimal Assistance - Patient > 75%     Toileting Toileting    Toileting assist Assist for toileting: Minimal Assistance - Patient > 75% Assistive Device Comment: toilet   Transfers Chair/bed transfer  Transfers assist  Chair/bed transfer activity did not occur: Refused  Chair/bed transfer assist level: Minimal Assistance - Patient > 75%     Locomotion Ambulation   Ambulation assist      Assist level: Minimal Assistance - Patient > 75% Assistive device: Walker-rolling Max distance: 271ft   Walk 10 feet activity   Assist     Assist level: Minimal Assistance - Patient > 75% Assistive device: Walker-rolling   Walk 50 feet activity  Assist    Assist level: Minimal Assistance - Patient > 75% Assistive device: Walker-rolling    Walk 150 feet activity   Assist    Assist level: Minimal Assistance - Patient > 75% Assistive device: Walker-rolling    Walk 10 feet on uneven surface  activity   Assist     Assist level: Minimal Assistance - Patient > 75% Assistive device: Walker-rolling   Wheelchair     Assist Is the patient using a wheelchair?: No             Wheelchair 50 feet with 2 turns activity    Assist            Wheelchair 150 feet activity     Assist          Blood pressure (!) 179/82, pulse 61,  temperature 97.7 F (36.5 C), temperature source Oral, resp. rate 18, height 6\' 2"  (1.88 m), weight 96.3 kg, SpO2 98%.  Medical Problem List and Plan: 1. Functional deficits secondary to L posterior cerebral and R frontal lobe              -patient may  shower             -ELOS/Goals: t  ELOS~2wks, lives with brother who is in home hospice but has nephew who can assist    Con't CIR PT, OT and SLP -offered pt chaplain services if he would like to speak with them  2.  Antithrombotics: -DVT/anticoagulation:  Pharmaceutical: Eliquis             -antiplatelet therapy: Aspirin 81 mg daily   3. Pain Management: Tylenol as needed   4. Mood/Behavior/Sleep: LCSW to evaluate and provide emotional support             -antipsychotic agents: n/a   5. Neuropsych/cognition: This patient is? capable of making decisions on his own behalf.   6. Skin/Wound Care: Routine skin care checks   7. 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   11: Acute left internal jugular and subclavian DVT on Eliquis 5 mg BID   12: DM-2: CBGs q AC and q HS; (home regimen includes NPH 12 units [place] daily with breakfast, Jardiance, semaglutide)             -continue Jardiance 10 mg daily             -continue SSI             -continue Levemir 8 units q HS  -9/12 fair control until yesterday--will increase levemir to 10u  Provided list of foods for better blood sugar control             CBG (last 3)  Recent Labs    02/22/23 1634 02/22/23 2116 02/23/23 0613  GLUCAP 135* 136* 122*    13: BPH: continue Flomax and finasteride   14: Chronic systolic/diastolic  CHF: EF = 65-78%; not on duretic at home             -  daily weight, weight reviewed and is decreased             -continue Pacerone 200 mg daily             -continue metoprolol succ 50 mg daily -Entresto held until orthostasis resolves   9/13- Weight stable 98.5 kg- will con't to monitor 15: CKD stage IV: baseline ~2.5-3.0             -follow-up BMP at baseline   16: History of SVT: on BB   17: Orthostatic hypotension: monitor with increased mobility             -TED hose and abd binder, feet dangling prior to activity  -encourage appropriate fluid intake             -Entresto held   18: History of CAD, CABG, stents on statin, BB   19. Small bowel obstruction- has resolved, but at risk. Had BM 9/13 with sorbitol  20.  Mild thrombocytopenia cont to monitor   21. New cough/stuffy nose  Continue add Flonase, Tessalon TID 100 mg and Robitussin prn- d/w nursing about plans- and if Sx's get worse at all, need further work up.   22. Cloudy urine: fluids started for dehydration, d/ced after weight increased 3kg, weight is still lower than baseline  23. Urinary retention: UA/UC ordered     LOS: 5 days A FACE TO FACE EVALUATION WAS PERFORMED  Drema Pry Lestine Rahe 02/23/2023, 8:44 AM

## 2023-02-23 NOTE — Progress Notes (Signed)
MD Raulkar notified of patient increase in weight this AM. Fluids D/C. Mylo Red, LPN

## 2023-02-24 ENCOUNTER — Inpatient Hospital Stay: Payer: Self-pay | Admitting: Adult Health

## 2023-02-24 DIAGNOSIS — I63532 Cerebral infarction due to unspecified occlusion or stenosis of left posterior cerebral artery: Secondary | ICD-10-CM | POA: Diagnosis not present

## 2023-02-24 LAB — CBC
HCT: 39 % (ref 39.0–52.0)
Hemoglobin: 12.5 g/dL — ABNORMAL LOW (ref 13.0–17.0)
MCH: 27.8 pg (ref 26.0–34.0)
MCHC: 32.1 g/dL (ref 30.0–36.0)
MCV: 86.9 fL (ref 80.0–100.0)
Platelets: 144 10*3/uL — ABNORMAL LOW (ref 150–400)
RBC: 4.49 MIL/uL (ref 4.22–5.81)
RDW: 13.3 % (ref 11.5–15.5)
WBC: 5.9 10*3/uL (ref 4.0–10.5)
nRBC: 0 % (ref 0.0–0.2)

## 2023-02-24 LAB — BASIC METABOLIC PANEL
Anion gap: 10 (ref 5–15)
BUN: 42 mg/dL — ABNORMAL HIGH (ref 8–23)
CO2: 23 mmol/L (ref 22–32)
Calcium: 9.1 mg/dL (ref 8.9–10.3)
Chloride: 107 mmol/L (ref 98–111)
Creatinine, Ser: 3.07 mg/dL — ABNORMAL HIGH (ref 0.61–1.24)
GFR, Estimated: 21 mL/min — ABNORMAL LOW (ref >60)
Glucose, Bld: 123 mg/dL — ABNORMAL HIGH (ref 70–99)
Potassium: 3.5 mmol/L (ref 3.5–5.1)
Sodium: 140 mmol/L (ref 135–145)

## 2023-02-24 LAB — GLUCOSE, CAPILLARY
Glucose-Capillary: 140 mg/dL — ABNORMAL HIGH (ref 70–99)
Glucose-Capillary: 132 mg/dL — ABNORMAL HIGH (ref 70–99)
Glucose-Capillary: 151 mg/dL — ABNORMAL HIGH (ref 70–99)
Glucose-Capillary: 191 mg/dL — ABNORMAL HIGH (ref 70–99)

## 2023-02-24 MED ORDER — AMOXICILLIN 250 MG PO CAPS
500.0000 mg | ORAL_CAPSULE | Freq: Two times a day (BID) | ORAL | Status: DC
  Administered 2023-02-24 – 2023-02-25 (×3): 500 mg via ORAL
  Filled 2023-02-24 (×3): qty 2

## 2023-02-24 MED ORDER — SODIUM CHLORIDE 0.45 % IV BOLUS
500.0000 mL | Freq: Once | INTRAVENOUS | Status: AC
  Administered 2023-02-24: 500 mL via INTRAVENOUS

## 2023-02-24 NOTE — Plan of Care (Signed)
  Problem: RH Swallowing Goal: LTG Patient will consume least restrictive diet using compensatory strategies with assistance (SLP) Description: LTG:  Patient will consume least restrictive diet using compensatory strategies with assistance (SLP) Outcome: Completed/Met Flowsheets (Taken 02/24/2023 1550) LTG: Pt Patient will consume least restrictive diet using compensatory strategies with assistance of (SLP): Modified Independent   Problem: RH Cognition - SLP Goal: RH LTG Patient will demonstrate orientation with cues Description:  LTG:  Patient will demonstrate orientation to person/place/time/situation with cues (SLP)   Outcome: Completed/Met Flowsheets (Taken 02/24/2023 1550) LTG: Patient will demonstrate orientation using cueing (SLP): Minimal Assistance - Patient > 75%   Problem: RH Problem Solving Goal: LTG Patient will demonstrate problem solving for (SLP) Description: LTG:  Patient will demonstrate problem solving for basic/complex daily situations with cues  (SLP) Outcome: Not Met (add Reason) Flowsheets (Taken 02/24/2023 1550) LTG: Patient will demonstrate problem solving for (SLP): Basic daily situations LTG Patient will demonstrate problem solving for: Minimal Assistance - Patient > 75% Note: Slower than anticipated progress   Problem: RH Memory Goal: LTG Patient will follow step by step directions w/cues (SLP) Description: LTG: Patient will follow step by step directions with cues (SLP). Outcome: Completed/Met Flowsheets (Taken 02/24/2023 1550) LTG: Patient will follow step by step directions: 2 steps LTG: Patient will follow step by step directions w/cues: Minimal Assistance - Patient > 75% Goal: LTG Patient will use memory compensatory aids to (SLP) Description: LTG:  Patient will use memory compensatory aids to recall biographical/new, daily complex information with cues (SLP) Outcome: Completed/Met Flowsheets (Taken 02/24/2023 1550) LTG: Patient will use memory  compensatory aids to (SLP): Minimal Assistance - Patient > 75%   Problem: RH Attention Goal: LTG Patient will demonstrate this level of attention during functional activites (SLP) Description: LTG:  Patient will will demonstrate this level of attention during functional activites (SLP) Outcome: Completed/Met Flowsheets (Taken 02/24/2023 1550) Patient will demonstrate during cognitive/linguistic activities the attention type of: Selective Patient will demonstrate this level of attention during cognitive/linguistic activities in: Controlled LTG: Patient will demonstrate this level of attention during cognitive/linguistic activities with assistance of (SLP): Minimal Assistance - Patient > 75%

## 2023-02-24 NOTE — Plan of Care (Signed)
  Problem: RH Balance Goal: LTG Patient will maintain dynamic standing with ADLs (OT) Description: LTG:  Patient will maintain dynamic standing balance with assist during activities of daily living (OT)  Outcome: Adequate for Discharge   Problem: Sit to Stand Goal: LTG:  Patient will perform sit to stand in prep for activites of daily living with assistance level (OT) Description: LTG:  Patient will perform sit to stand in prep for activites of daily living with assistance level (OT) Outcome: Adequate for Discharge   Problem: RH Bathing Goal: LTG Patient will bathe all body parts with assist levels (OT) Description: LTG: Patient will bathe all body parts with assist levels (OT) Outcome: Adequate for Discharge   Problem: RH Dressing Goal: LTG Patient will perform upper body dressing (OT) Description: LTG Patient will perform upper body dressing with assist, with/without cues (OT). Outcome: Adequate for Discharge Goal: LTG Patient will perform lower body dressing w/assist (OT) Description: LTG: Patient will perform lower body dressing with assist, with/without cues in positioning using equipment (OT) Outcome: Adequate for Discharge   Problem: RH Toileting Goal: LTG Patient will perform toileting task (3/3 steps) with assistance level (OT) Description: LTG: Patient will perform toileting task (3/3 steps) with assistance level (OT)  Outcome: Adequate for Discharge   Problem: RH Toilet Transfers Goal: LTG Patient will perform toilet transfers w/assist (OT) Description: LTG: Patient will perform toilet transfers with assist, with/without cues using equipment (OT) Outcome: Adequate for Discharge   Problem: RH Tub/Shower Transfers Goal: LTG Patient will perform tub/shower transfers w/assist (OT) Description: LTG: Patient will perform tub/shower transfers with assist, with/without cues using equipment (OT) Outcome: Completed/Met

## 2023-02-24 NOTE — Progress Notes (Addendum)
Patient ID: Shane Holloway, male   DOB: May 26, 1951, 72 y.o.   MRN: [id number]  Sw spoke with niece, Babette Relic, services have been arranged for pt's brother for tomorrow. Patient and family requesting discharge today and arranging HH. Patient will have 24/7 supervision at d/c from family.  Patient has a personal WC and ramped entrance. SW informed team, SW will wait for FU. No additional questions or concerns.   9:37 PM: Patient to have labs rechecked. Patient to discharge tomorrow 9/17.  Niece informed.

## 2023-02-24 NOTE — Progress Notes (Signed)
Physical Therapy Session Note  Patient Details  Name: Shane Holloway MRN: [id number] Date of Birth: 02/06/1951  Today's Date: 02/24/2023 PT Individual Time: 1300-1351 PT Individual Time Calculation (min): 51 min   Short Term Goals: Week 1:  PT Short Term Goal 1 (Week 1): STG = LTG due to ELOS   Skilled Therapeutic Interventions/Progress Updates:    Session focused on grad day activities as pt now planning to d/c tomorrow per pt and family request. See d/c summary for assessment and further details.   Bed mobility independent from flat surface without rails to simulate home environment. Functional transfers with RW throughout session with close supervision for safety due to decreased balance and fall risk.   Functional gait training on unit with supervision to CGA for balance, attention and cues for R foot clearance, and positioning of RW (tendencey for significant forward flexion).   Completed simulated car transfer, ramp for home access and community mobility with RW, and stair negotiation for community access. Again pt overall close supervision to CGA overall for balance due to fall risk, decreased strength and attention to the R.  Issued and reviewed HEP with pt and hand out given. Pt completed 1 set of each x 10 reps on BLE for functional strengthening and balance training.   Access Code: [email or web address] URL: [email or web address] Date: 02/24/2023 Prepared by: Jill Side  Exercises - Standing Hip Abduction with Counter Support  - 1 x daily - 7 x weekly - 3 sets - 10 reps - Standing Hamstring Curl with Chair Support  - 1 x daily - 7 x weekly - 3 sets - 10 reps - Mini Squat with Counter Support  - 1 x daily - 7 x weekly - 3 sets - 10 reps - Standing Heel Raise with Support  - 1 x daily - 7 x weekly - 3 sets - 10 reps  Therapy Documentation Precautions:  Precautions Precautions: Fall Restrictions Weight Bearing Restrictions: No General: PT Amount of Missed Time (min): 9  Minutes PT Missed Treatment Reason: Patient unwilling to participate;Patient fatigue Pt requesting to finish session early due to personal reasons regarding his brother's recent passing and overall fatigue.  Pain:  Denies pain. Mobility: Bed Mobility Bed Mobility: Supine to Sit;Sit to Supine;Rolling Left Rolling Right: Independent Rolling Left: Independent Supine to Sit: Independent Sit to Supine: Independent Transfers Transfers: Sit to Stand;Stand to Sit;Stand Pivot Transfers Sit to Stand: Supervision/Verbal cueing Stand to Sit: Supervision/Verbal cueing Stand Pivot Transfers: Supervision/Verbal cueing;Contact Guard/Touching assist Stand Pivot Transfer Details: Verbal cues for gait pattern;Verbal cues for safe use of DME/AE;Verbal cues for technique Locomotion : Gait Ambulation: Yes Gait Assistance: Supervision/Verbal cueing;Contact Guard/Touching assist Gait Distance (Feet): 150 Feet Assistive device: Rolling walker Gait Assistance Details: Verbal cues for gait pattern;Verbal cues for safe use of DME/AE;Tactile cues for posture Gait Gait: Yes Gait Pattern: Impaired Stairs / Additional Locomotion Stairs: Yes Stairs Assistance: Supervision/Verbal cueing;Contact Guard/Touching assist Stair Management Technique: Two rails;Alternating pattern;Step to pattern Number of Stairs: 12 Ramp: Supervision/Verbal cueing Wheelchair Mobility Wheelchair Mobility: No  Trunk/Postural Assessment : Cervical Assessment Cervical Assessment: Exceptions to [facility] (forward head) Thoracic Assessment Thoracic Assessment: Exceptions to [facility] (flexed posture) Lumbar Assessment Lumbar Assessment: Exceptions to [facility] (posterior pelvic tilt)  Balance: Balance Balance Assessed: Yes Static Sitting Balance Static Sitting - Level of Assistance: 6: Modified independent (Device/Increase time) Dynamic Sitting Balance Dynamic Sitting - Level of Assistance: 6: Modified independent (Device/Increase time) Static  Standing Balance Static Standing - Level of Assistance: 5: Stand by assistance Dynamic  Standing Balance Dynamic Standing - Level of Assistance: 5: Stand by assistance;4: Min assist     Therapy/Group: Individual Therapy  Karolee Stamps Darrol Poke, PT, DPT, CBIS  02/24/2023, 3:12 PM

## 2023-02-24 NOTE — Progress Notes (Signed)
Speech Language Pathology Discharge Summary  Patient Details  Name: Shane Holloway MRN: [id number] Date of Birth: 1951/04/12  Date of Discharge from SLP service:February 24, 2023  Today's Date: 02/24/2023 SLP Individual Time: 1000-1100 SLP Individual Time Calculation (min): 60 min   Skilled Therapeutic Interventions:  Pt was seen in am to address cognitive re- training. Pt was alert and seen at bedside. He initially denied pain however, later reported headache in which SLP guided pt in using his call button to ask for help. SLP addressed orientation through challenging pt to use external visual aid to identify temporal concepts. Pt identified month, day of week, and date with min A. SLP further challenged pt in problem solving within home environment. Given scenarios presented verbally, pt identified solutions with min A and 75% accuracy. SLP further challenged pt in recall of functional information. Pt recalled functional information given a 2-3 minute delay with 90% acc with min A. Pt left at bedside with call button within reach and bed alarm active.     Patient has met 5 of 6 long term goals.  Patient to discharge at [facility] level.  Reasons goals not met: Slower than anticipated progress toward problem solving goals   Clinical Impression/Discharge Summary: Pt has made excellent progress and has met 5 of 6 LTG's this admission due to improved attention, problem solving, and memory. Pt is currently an overall min A for cognitive tasks and mod I for swallowing function. Pt/ family education complete and pt will discharge home with 24 hour supervision from family. Pt would benefit from home health f/u SLP services to maximize cognition in order to maximize his functional independence.  Care Partner:  Caregiver Able to Provide Assistance: Yes  Type of Caregiver Assistance: Cognitive  Recommendation:  Home Health SLP;Outpatient SLP;24 hour supervision/assistance  Rationale for SLP  Follow Up: Maximize cognitive function and independence   Equipment:     Reasons for discharge: Treatment goals met;Discharged from hospital   Patient/Family Agrees with Progress Made and Goals Achieved: Yes    Renaee Munda 02/24/2023, 3:58 PM

## 2023-02-24 NOTE — Progress Notes (Signed)
Patient ID: Shane Holloway, male   DOB: September 16, 1950, 72 y.o.   MRN: [id number]   NO DME needs currently. HH for FU.

## 2023-02-24 NOTE — Progress Notes (Signed)
Patient ID: Shane Holloway, male   DOB: January 24, 1951, 72 y.o.   MRN: [id number]  [facility] referral sent to [facility].

## 2023-02-24 NOTE — Progress Notes (Signed)
Physical Therapy Discharge Summary  Patient Details  Name: Shane Holloway MRN: [id number] Date of Birth: 1950-09-12  Date of Discharge from PT service:February 24, 2023  Today's Date: 02/24/2023      Patient has met 5 of 9 long term goals due to improved activity tolerance, improved balance, improved postural control, ability to compensate for deficits, functional use of  right upper extremity and right lower extremity, improved awareness, and improved coordination.  Patient to discharge at an ambulatory level  supervision to CGA with RW .   Patient's family  is available  to provide the necessary cognitive and supervision  assistance at discharge.  Reasons goals not met: Pt did not meet some of the goals that were set at modified independent or supervision level due to not completing the full recommended LOS. Pt and family request to leave program early due to attending brother's funeral. Family had come in for education earlier during the stay providing hands on level of assistance and will be available upon d/c for this as well.   Recommendation:  Patient will benefit from ongoing skilled PT services in home health setting to continue to advance safe functional mobility, address ongoing impairments in strength, balance, endurance, NMR, and minimize fall risk.  Equipment: No equipment provided. Pt owns RW and w/c already.   Reasons for discharge: discharge from hospital. Pt and family request to leave prior to anticipated ELOS due to family situation. Family had completed modified family education and is able to provide the needed level of care. They were in agreement that pt did not fully complete program.   Patient/family agrees with progress made and goals achieved: Yes  PT Discharge Precautions/Restrictions Precautions Precautions: Fall Restrictions Weight Bearing Restrictions: No   Pain  Denies pain. Pain Interference Pain Interference Pain Effect on Sleep: 0. Does not  apply - I have not had any pain or hurting in the past 5 days Pain Interference with Therapy Activities: 0. Does not apply - I have not received rehabilitationtherapy in the past 5 days;1. Rarely or not at all Pain Interference with Day-to-Day Activities: 1. Rarely or not at all Vision/Perception  Vision - History Ability to See in Adequate Light: 1 Impaired Vision - Assessment Ocular Range of Motion: Within Functional Limits Alignment/Gaze Preference: Within Defined Limits Tracking/Visual Pursuits: Decreased smoothness of horizontal tracking;Decreased smoothness of vertical tracking Saccades: Decreased speed of saccadic movement Perception Perception: Within Functional Limits Praxis Praxis: WFL  Cognition Safety/Judgment: Impaired Decreased insight and awareness at times slightly impulsive.  Sensation Sensation Light Touch: Impaired Detail Light Touch Impaired Details: Impaired RLE Proprioception: Appears Intact Coordination Gross Motor Movements are Fluid and Coordinated: No Motor  Motor Motor: Abnormal postural alignment and control;Other (comment) Motor - Skilled Clinical Observations: RLE weakness; general deconditioning and weakness  Mobility Bed Mobility Bed Mobility: Supine to Sit;Sit to Supine;Rolling Left Rolling Right: Independent Rolling Left: Independent Supine to Sit: Independent Sit to Supine: Independent Transfers Transfers: Sit to Stand;Stand to Sit;Stand Pivot Transfers Sit to Stand: Supervision/Verbal cueing Stand to Sit: Supervision/Verbal cueing Stand Pivot Transfers: Supervision/Verbal cueing;Contact Guard/Touching assist Stand Pivot Transfer Details: Verbal cues for gait pattern;Verbal cues for safe use of DME/AE;Verbal cues for technique Locomotion  Gait Ambulation: Yes Gait Assistance: Supervision/Verbal cueing;Contact Guard/Touching assist Gait Distance (Feet): 150 Feet Assistive device: Rolling walker Gait Assistance Details: Verbal cues for  gait pattern;Verbal cues for safe use of DME/AE;Tactile cues for posture Gait Gait: Yes Gait Pattern: Impaired Stairs / Additional Locomotion Stairs: Yes Stairs Assistance: Supervision/Verbal  cueing;Contact Guard/Touching assist Stair Management Technique: Two rails;Alternating pattern;Step to pattern Number of Stairs: 12 Ramp: Supervision/Verbal cueing Wheelchair Mobility Wheelchair Mobility: No  Trunk/Postural Assessment  Cervical Assessment Cervical Assessment: Exceptions to [facility] (forward head) Thoracic Assessment Thoracic Assessment: Exceptions to [facility] (flexed posture) Lumbar Assessment Lumbar Assessment: Exceptions to [facility] (posterior pelvic tilt)  Balance Balance Balance Assessed: Yes Static Sitting Balance Static Sitting - Level of Assistance: 6: Modified independent (Device/Increase time) Dynamic Sitting Balance Dynamic Sitting - Level of Assistance: 6: Modified independent (Device/Increase time) Static Standing Balance Static Standing - Level of Assistance: 5: Stand by assistance Dynamic Standing Balance Dynamic Standing - Level of Assistance: 5: Stand by assistance;4: Min assist Extremity Assessment  RUE Assessment RUE Assessment: Within Functional Limits Active Range of Motion (AROM) Comments: limited to 160 degrees of flexion (premorbid) LUE Assessment LUE Assessment: Within Functional Limits Active Range of Motion (AROM) Comments: limited to 160 degrees of flexion (premorbid) RLE Assessment RLE Assessment: Exceptions to [facility] RLE Strength RLE Overall Strength Comments: grossly 4-/5 LLE Assessment LLE Assessment: Exceptions to [facility] Strength Comments: grossly 4+/5   Delorise Royals, PT, DPT, CBIS  02/24/2023, 2:01 PM

## 2023-02-24 NOTE — Progress Notes (Signed)
Occupational Therapy Discharge Summary  Patient Details  Name: Shane Holloway MRN: [id number] Date of Birth: 02-17-1951  Date of Discharge from OT service:February 24, 2023   Patient has met 1 of 8 long term goals due to improved activity tolerance, improved balance, postural control, ability to compensate for deficits, functional use of  RIGHT lower extremity, improved attention, improved awareness, and improved coordination.  Patient to discharge at overall Supervision level. Pt was requiring min A at admission. Patient's care partner is independent to provide the necessary physical and cognitive assistance at discharge.   Pt does have 24/7 supervision at home.  A supervision family education sheet left in room for pt and his family.  Reasons goals not met: The initial goals were set at a mod Independent level (except for shower transfer with supervision.) with the expectation of a longer length of stay.  Pt is leaving 6 days after initial evaluation to attend his brother's funeral.  Goals not met are adequate for discharge and not met due to shorter LOS.   Recommendation:  Patient will benefit from ongoing skilled OT services in home health setting to continue to advance functional skills in the area of BADL.  Equipment: No equipment provided  Reasons for discharge: treatment goals met at an adequate level for discharge as family will be there 24/7 for supervision  Patient/family agrees with progress made and goals achieved: Yes  OT Discharge Precautions/Restrictions  Precautions Precautions: Fall Restrictions Weight Bearing Restrictions: No   ADL ADL Eating: Independent Grooming: Setup Upper Body Bathing: Setup Where Assessed-Upper Body Bathing: Shower Lower Body Bathing: Supervision/safety Where Assessed-Lower Body Bathing: Shower Upper Body Dressing: Setup Where Assessed-Upper Body Dressing: Chair Lower Body Dressing: Supervision/safety Where Assessed-Lower Body  Dressing: Chair Toileting: Supervision/safety Where Assessed-Toileting: Teacher, adult education: Close supervision Toilet Transfer Method: Proofreader: Acupuncturist: Close supervision Film/video editor Method: Designer, industrial/product: Grab bars, Sales promotion account executive Baseline Vision/History: 1 Wears glasses Patient Visual Report: No change from baseline Ocular Range of Motion: Within Functional Limits Alignment/Gaze Preference: Within Defined Limits Tracking/Visual Pursuits: Decreased smoothness of horizontal tracking;Decreased smoothness of vertical tracking Saccades: Decreased speed of saccadic movement Perception  Perception: Within Functional Limits Praxis Praxis: WFL Cognition Cognition Overall Cognitive Status: History of cognitive impairments - at baseline Arousal/Alertness: Awake/alert Orientation Level: Person;Place;Situation Person: Oriented Place: Oriented Situation: Oriented Memory: Impaired Memory Impairment: Retrieval deficit;Decreased recall of new information;Decreased short term memory;Storage deficit Decreased Short Term Memory: Functional basic Focused Attention: Appears intact Sustained Attention: Appears intact Safety/Judgment: Impaired (needs supervision as he will often get up by himself) Brief Interview for Mental Status (BIMS) Repetition of Three Words (First Attempt): 3 Temporal Orientation: Year: Correct Temporal Orientation: Month: No answer (kept answering "19" even with correction) Temporal Orientation: Day: Correct Recall: "Sock": No, could not recall Recall: "Blue": Yes, after cueing ("a color") Recall: "Bed": Yes, after cueing ("a piece of furniture") BIMS Summary Score: 9 Sensation Sensation Light Touch: Impaired by gross assessment Light Touch Impaired Details: Impaired RLE Hot/Cold: Appears Intact Proprioception: Appears Intact Coordination Gross Motor Movements are  Fluid and Coordinated: No Fine Motor Movements are Fluid and Coordinated: Yes Finger Nose Finger Test: [facility] Heel Shin Test: slowed overall, no dysmetria Motor  Motor Motor - Skilled Clinical Observations: RLE weakness; general deconditioning and weakness Motor - Discharge Observations: RLE weakness improved from admission, pt can ambulate with RW short distances in house with supervision Mobility    Close Supervision with RW Trunk/Postural  Assessment  Postural Control Postural Control: Within Functional Limits  Balance  Close supervision with and without RW during BADLs Extremity/Trunk Assessment RUE Assessment RUE Assessment: Within Functional Limits Active Range of Motion (AROM) Comments: limited to 160 degrees of flexion (premorbid) LUE Assessment LUE Assessment: Within Functional Limits Active Range of Motion (AROM) Comments: limited to 160 degrees of flexion (premorbid)   Shane Holloway 02/24/2023, 9:23 AM

## 2023-02-24 NOTE — Progress Notes (Signed)
Occupational Therapy Session Note  Patient Details  Name: Shane Holloway MRN: [id number] Date of Birth: Dec 04, 1950  Today's Date: 02/24/2023 OT Individual Time: 4098-1191 OT Individual Time Calculation (min): 75 min    Short Term Goals: Week 1:  OT Short Term Goal 1 (Week 1): STGs = LTGs  Skilled Therapeutic Interventions/Progress Updates:    Pt received in bed ready for therapy.  Pt was able to use his RW to ambulate in his room and to the bathroom to toilet and then to the shower with supervision.   He completed all self care including toileting, showering and dressing with set up to supervision. Pt sat to shave.  Pt needs occasional cues to push up from where he is standing.  Pt will be leaving tomorrow am to attend his brother's funeral and then go home from there.  The initial goals were set at a mod Independent level (except for shower and shower transfer with supervision.) with the expectation of a longer length of stay. Pt does have 24/7 supervision at home.  A supervision family education sheet left in room for pt and his family. Pt resting in recliner with all needs met and alarm set.    Therapy Documentation Precautions:  Precautions Precautions: Fall Restrictions Weight Bearing Restrictions: No    Vital Signs: Therapy Vitals Temp: 97.6 F (36.4 C) Temp Source: Oral Pulse Rate: (!) 54 Resp: 17 BP: 138/88 Patient Position (if appropriate): Lying Oxygen Therapy SpO2: 95 % O2 Device: Room Air Pain: Pain Assessment Pain Scale: 0-10 Pain Score: 0-No pain ADL: ADL Eating: Independent Grooming: Setup Upper Body Bathing: Setup Where Assessed-Upper Body Bathing: Shower Lower Body Bathing: Supervision/safety Where Assessed-Lower Body Bathing: Shower Upper Body Dressing: Setup Where Assessed-Upper Body Dressing: Chair Lower Body Dressing: Supervision/safety Where Assessed-Lower Body Dressing: Chair Toileting: Supervision/safety Where Assessed-Toileting:  Teacher, adult education: Close supervision Statistician Method: Proofreader: Acupuncturist: Close supervision Film/video editor Method: Designer, industrial/product: Grab bars, Transfer tub bench      Therapy/Group: Individual Therapy  Abednego Yeates 02/24/2023, 9:26 AM

## 2023-02-24 NOTE — Progress Notes (Signed)
PROGRESS NOTE   Urine re incubated due to NG, is ion keflex for + UA , afeb , no dysuria  DIscussed IVF need  Enc po fluid intake  Pt plans to go to brother's funeral tomorrow  . ROS: Pt denies SOB, abd pain, CP, N/V/C/D, and vision changes, +urinary retention    Objective:   No results found. Recent Labs    02/24/23 0656  WBC 5.9  HGB 12.5*  HCT 39.0  PLT 144*    Recent Labs    02/24/23 0656  NA 140  K 3.5  CL 107  CO2 23  GLUCOSE 123*  BUN 42*  CREATININE 3.07*  CALCIUM 9.1     Intake/Output Summary (Last 24 hours) at 02/24/2023 0857 Last data filed at 02/24/2023 0735 Gross per 24 hour  Intake 714 ml  Output 2100 ml  Net -1386 ml        Physical Exam: Vital Signs Blood pressure 138/88, pulse (!) 54, temperature 97.6 F (36.4 C), temperature source Oral, resp. rate 17, height 6\' 2"  (1.88 m), weight 96.3 kg, SpO2 95%.  General: No acute distress Mood and affect are appropriate Heart: Regular rate and rhythm no rubs murmurs or extra sounds Lungs: Clear to auscultation, breathing unlabored, no rales or wheezes Abdomen: Positive bowel sounds, soft nontender to palpation, nondistended Extremities: No clubbing, cyanosis, or edema Skin: No evidence of breakdown, no evidence of rash  Musculoskeletal: Full range of motion in all 4 extremities. No joint swelling  Neurologic: Cranial nerves II through XII intact, motor strength is 5/5 in Left and 4/5 RIght deltoid, bicep, tricep, grip, hip flexor, knee extensors, ankle dorsiflexor and plantar flexor Sensory exam unable to eval due to word finding issues   Musculoskeletal: Full range of motion in all 4 extremities. No joint swelling     Assessment/Plan: 1. Functional deficits which require 3+ hours per day of interdisciplinary therapy in a comprehensive inpatient rehab setting. Physiatrist is providing close team supervision and 24 hour management  of active medical problems listed below. Physiatrist and rehab team continue to assess barriers to discharge/monitor patient progress toward functional and medical goals  Care Tool:  Bathing    Body parts bathed by patient: Right arm, Left arm, Chest, Abdomen, Front perineal area, Buttocks, Right upper leg, Face, Left lower leg, Right lower leg, Left upper leg         Bathing assist Assist Level: Supervision/Verbal cueing     Upper Body Dressing/Undressing Upper body dressing   What is the patient wearing?: Pull over shirt    Upper body assist Assist Level: Set up assist    Lower Body Dressing/Undressing Lower body dressing      What is the patient wearing?: Underwear/pull up, Pants     Lower body assist Assist for lower body dressing: Minimal Assistance - Patient > 75%     Toileting Toileting    Toileting assist Assist for toileting: Minimal Assistance - Patient > 75% Assistive Device Comment: toilet   Transfers Chair/bed transfer  Transfers assist  Chair/bed transfer activity did not occur: Refused  Chair/bed transfer assist level: Minimal Assistance - Patient > 75%     Locomotion Ambulation  Ambulation assist      Assist level: Minimal Assistance - Patient > 75% Assistive device: Walker-rolling Max distance: 234ft   Walk 10 feet activity   Assist     Assist level: Minimal Assistance - Patient > 75% Assistive device: Walker-rolling   Walk 50 feet activity   Assist    Assist level: Minimal Assistance - Patient > 75% Assistive device: Walker-rolling    Walk 150 feet activity   Assist    Assist level: Minimal Assistance - Patient > 75% Assistive device: Walker-rolling    Walk 10 feet on uneven surface  activity   Assist     Assist level: Minimal Assistance - Patient > 75% Assistive device: Walker-rolling   Wheelchair     Assist Is the patient using a wheelchair?: No             Wheelchair 50 feet with 2 turns  activity    Assist            Wheelchair 150 feet activity     Assist          Blood pressure 138/88, pulse (!) 54, temperature 97.6 F (36.4 C), temperature source Oral, resp. rate 17, height 6\' 2"  (1.88 m), weight 96.3 kg, SpO2 95%.  Medical Problem List and Plan: 1. Functional deficits secondary to L posterior cerebral and R frontal lobe              -patient may  shower             -ELOS/Goals:D/C in am will need CGA from a mobility standpoint 2.  Antithrombotics: -DVT/anticoagulation:  Pharmaceutical: Eliquis             -antiplatelet therapy: Aspirin 81 mg daily   3. Pain Management: Tylenol as needed   4. Mood/Behavior/Sleep: LCSW to evaluate and provide emotional support             -antipsychotic agents: n/a   5. Neuropsych/cognition: This patient is? capable of making decisions on his own behalf.   6. Skin/Wound Care: Routine skin care checks   7. 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   11: Acute left internal jugular and subclavian DVT on Eliquis 5 mg BID   12: DM-2: CBGs q AC and q HS; (home regimen includes NPH 12 units [place] daily with breakfast, Jardiance, semaglutide)             -continue Jardiance 10 mg daily             -continue SSI             -continue Levemir 8 units q HS  -9/12 fair control until yesterday--will increase levemir to 10u  Provided list of foods for better blood sugar control             CBG (last 3)  Recent Labs    02/23/23 1627 02/23/23 2055 02/24/23 0600  GLUCAP 180* 306* 140*    13: BPH: continue  Flomax and finasteride   14: Chronic  systolic/diastolic CHF: EF = 55-60%; not on duretic at home             -daily weight, weight reviewed and is decreased             -continue Pacerone 200 mg daily             -continue metoprolol succ 50 mg daily -Entresto held until orthostasis resolves   9/13- Weight stable 98.5 kg- will con't to monitor 15: CKD stage IV: baseline ~2.5-3.0             -follow-up BMP creat at baseline although the BUN is up will give IVF this am   BMP in am  16: History of SVT: on BB   17: Orthostatic hypotension: monitor with increased mobility             -TED hose and abd binder, feet dangling prior to activity  -encourage appropriate fluid intake             -Entresto held   18: History of CAD, CABG, stents on statin, BB   19. Small bowel obstruction- has resolved, but at risk. Had BM 9/13 with sorbitol  20.  Mild thrombocytopenia cont to monitor   21. New cough/stuffy nose  Continue add Flonase, Tessalon TID 100 mg and Robitussin prn- d/w nursing about plans- and if Sx's get worse at all, need further work up.   22. Urinary retention + UTI : UA/UC + WBC and many bact - await re incubation      LOS: 6 days A FACE TO FACE EVALUATION WAS PERFORMED  Erick Colace 02/24/2023, 8:57 AM

## 2023-02-25 DIAGNOSIS — I63532 Cerebral infarction due to unspecified occlusion or stenosis of left posterior cerebral artery: Secondary | ICD-10-CM | POA: Diagnosis not present

## 2023-02-25 LAB — BASIC METABOLIC PANEL
Anion gap: 8 (ref 5–15)
BUN: 39 mg/dL — ABNORMAL HIGH (ref 8–23)
CO2: 24 mmol/L (ref 22–32)
Calcium: 8.9 mg/dL (ref 8.9–10.3)
Chloride: 108 mmol/L (ref 98–111)
Creatinine, Ser: 3.13 mg/dL — ABNORMAL HIGH (ref 0.61–1.24)
GFR, Estimated: 20 mL/min — ABNORMAL LOW (ref >60)
Glucose, Bld: 146 mg/dL — ABNORMAL HIGH (ref 70–99)
Potassium: 3.7 mmol/L (ref 3.5–5.1)
Sodium: 140 mmol/L (ref 135–145)

## 2023-02-25 LAB — GLUCOSE, CAPILLARY: Glucose-Capillary: 140 mg/dL — ABNORMAL HIGH (ref 70–99)

## 2023-02-25 LAB — URINE CULTURE: Culture: >=100000 — AB

## 2023-02-25 MED ORDER — INSULIN DEGLUDEC 100 UNIT/ML ~~LOC~~ SOPN: 10.0000 [IU] | PEN_INJECTOR | Freq: Every day | SUBCUTANEOUS | 0 refills | Status: DC

## 2023-02-25 MED ORDER — TAMSULOSIN HCL 0.4 MG PO CAPS: 0.4000 mg | ORAL_CAPSULE | Freq: Every day | ORAL | 0 refills | Status: AC

## 2023-02-25 MED ORDER — GUAIFENESIN-DM 100-10 MG/5ML PO SYRP: 10.0000 mL | ORAL_SOLUTION | ORAL | Status: DC | PRN

## 2023-02-25 MED ORDER — ACETAMINOPHEN 325 MG PO TABS: 325.0000 mg | ORAL_TABLET | ORAL | Status: AC | PRN

## 2023-02-25 MED ORDER — INSULIN GLARGINE 100 UNIT/ML SOLOSTAR PEN: 10.0000 [IU] | PEN_INJECTOR | Freq: Every day | SUBCUTANEOUS | 11 refills | Status: DC

## 2023-02-25 MED ORDER — AURORA PEN NEEDLES 31G X 6 MM MISC: 1.0000 | Freq: Every day | 0 refills | Status: AC

## 2023-02-25 MED ORDER — MELATONIN 5 MG PO TABS: 5.0000 mg | ORAL_TABLET | Freq: Every day | ORAL | Status: DC

## 2023-02-25 MED ORDER — BASAGLAR KWIKPEN 100 UNIT/ML ~~LOC~~ SOPN: 10.0000 [IU] | PEN_INJECTOR | Freq: Every day | SUBCUTANEOUS | 0 refills | Status: DC

## 2023-02-25 MED ORDER — INSULIN GLARGINE 100 UNIT/ML SOLOSTAR PEN: 10.0000 [IU] | PEN_INJECTOR | Freq: Every day | SUBCUTANEOUS | 0 refills | Status: AC

## 2023-02-25 MED ORDER — AMOXICILLIN 500 MG PO CAPS: 500.0000 mg | ORAL_CAPSULE | Freq: Two times a day (BID) | ORAL | 0 refills | Status: AC

## 2023-02-25 MED ORDER — INSULIN DETEMIR 100 UNIT/ML ~~LOC~~ SOLN: 12.0000 [IU] | Freq: Every day | SUBCUTANEOUS | 1 refills | Status: DC

## 2023-02-25 MED ORDER — FLUTICASONE PROPIONATE 50 MCG/ACT NA SUSP: 1.0000 | Freq: Every day | NASAL | Status: DC

## 2023-02-25 NOTE — Progress Notes (Addendum)
PROGRESS NOTE   Feels ok this am, urine is now clear and light yellow  . ROS: Pt denies SOB, abd pain, CP, N/V/C/D, and vision changes, +urinary retention    Objective:   No results found. Recent Labs    02/24/23 0656  WBC 5.9  HGB 12.5*  HCT 39.0  PLT 144*    Recent Labs    02/24/23 0656 02/25/23 0511  NA 140 140  K 3.5 3.7  CL 107 108  CO2 23 24  GLUCOSE 123* 146*  BUN 42* 39*  CREATININE 3.07* 3.13*  CALCIUM 9.1 8.9     Intake/Output Summary (Last 24 hours) at 02/25/2023 0825 Last data filed at 02/25/2023 0452 Gross per 24 hour  Intake 355 ml  Output 2350 ml  Net -1995 ml        Physical Exam: Vital Signs Blood pressure (!) 159/86, pulse (!) 59, temperature 98 F (36.7 C), resp. rate 16, height 6\' 2"  (1.88 m), weight 96.3 kg, SpO2 98%.  General: No acute distress Mood and affect are appropriate Heart: Regular rate and rhythm no rubs murmurs or extra sounds Lungs: Clear to auscultation, breathing unlabored, no rales or wheezes Abdomen: Positive bowel sounds, soft nontender to palpation, nondistended Extremities: No clubbing, cyanosis, or edema Skin: No evidence of breakdown, no evidence of rash  Musculoskeletal: Full range of motion in all 4 extremities. No joint swelling  Neurologic: Cranial nerves II through XII intact, motor strength is 5/5 in Left and 4/5 RIght deltoid, bicep, tricep, grip, hip flexor, knee extensors, ankle dorsiflexor and plantar flexor Sensory exam unable to eval due to word finding issues   Musculoskeletal: Full range of motion in all 4 extremities. No joint swelling     Assessment/Plan: 1. Functional deficits bilateral cortical infarcts Stable for D/C today F/u PCP in 3-4 weeks F/u PM&R 2 weeks F/u neuro 1-2 mo F/u nephro per their office F/u cardiology per their office  See D/C summary See D/C instructions   Care Tool:  Bathing    Body parts bathed by  patient: Right arm, Left arm, Chest, Abdomen, Front perineal area, Buttocks, Right upper leg, Face, Left lower leg, Right lower leg, Left upper leg         Bathing assist Assist Level: Supervision/Verbal cueing     Upper Body Dressing/Undressing Upper body dressing   What is the patient wearing?: Pull over shirt    Upper body assist Assist Level: Set up assist    Lower Body Dressing/Undressing Lower body dressing      What is the patient wearing?: Underwear/pull up, Pants     Lower body assist Assist for lower body dressing: Supervision/Verbal cueing     Toileting Toileting    Toileting assist Assist for toileting: Supervision/Verbal cueing Assistive Device Comment: toilet   Transfers Chair/bed transfer  Transfers assist  Chair/bed transfer activity did not occur: Refused  Chair/bed transfer assist level: Supervision/Verbal cueing     Locomotion Ambulation   Ambulation assist      Assist level: Supervision/Verbal cueing Assistive device: Walker-rolling Max distance: 150'   Walk 10 feet activity   Assist     Assist level: Supervision/Verbal cueing Assistive device:  Walker-rolling   Walk 50 feet activity   Assist    Assist level: Supervision/Verbal cueing Assistive device: Walker-rolling    Walk 150 feet activity   Assist    Assist level: Contact Guard/Touching assist Assistive device: Walker-rolling    Walk 10 feet on uneven surface  activity   Assist     Assist level: Contact Guard/Touching assist Assistive device: Walker-rolling   Wheelchair     Assist Is the patient using a wheelchair?: Yes Type of Wheelchair: Manual    Wheelchair assist level: Dependent - Patient 0%      Wheelchair 50 feet with 2 turns activity    Assist        Assist Level: Dependent - Patient 0%   Wheelchair 150 feet activity     Assist      Assist Level: Dependent - Patient 0%   Blood pressure (!) 159/86, pulse (!) 59,  temperature 98 F (36.7 C), resp. rate 16, height 6\' 2"  (1.88 m), weight 96.3 kg, SpO2 98%.  Medical Problem List and Plan: 1. Functional deficits secondary to L posterior cerebral and R frontal lobe              -patient may  shower             -ELOS/Goals:D/C today  2.  Antithrombotics: -DVT/anticoagulation:  Pharmaceutical: Eliquis             -antiplatelet therapy: Aspirin 81 mg daily   3. Pain Management: Tylenol as needed   4. Mood/Behavior/Sleep: LCSW to evaluate and provide emotional support             -antipsychotic agents: n/a   5. Neuropsych/cognition: This patient is? capable of making decisions on his own behalf.   6. Skin/Wound Care: Routine skin care checks   7. 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   11: Acute left internal jugular and subclavian DVT on Eliquis 5 mg BID   12: DM-2: CBGs q AC and q HS; (home regimen includes NPH 12 units [place] daily with breakfast, Jardiance, semaglutide)             -continue Jardiance 10 mg daily             -continue SSI             -continue Levemir 8 units q HS  -9/12 fair control until yesterday--will increase levemir to 10u  Provided list of foods for better blood sugar control             CBG (last 3)  Recent Labs    02/24/23 1625 02/24/23 2118 02/25/23 0620  GLUCAP 151* 191* 140*    13: BPH: continue Flomax and finasteride   14: Chronic systolic/diastolic CHF: EF = 55-60%; not on duretic at home             -daily weight, weight reviewed and is decreased             -  continue Pacerone 200 mg daily             -continue metoprolol succ 50 mg  daily -Entresto held until orthostasis resolves   9/13- Weight stable 98.5 kg- will con't to monitor 15: CKD stage IV: baseline ~2.5-3.0             Pt remains at baseline despite IVF yesterday , pt to f/u with nephro as OP   BMP in am  16: History of SVT: on BB   17: Orthostatic hypotension: monitor with increased mobility             -TED hose and abd binder, feet dangling prior to activity  -encourage appropriate fluid intake             -Entresto held   18: History of CAD, CABG, stents on statin, BB   19. Small bowel obstruction- has resolved, but at risk. Had BM 9/13 with sorbitol  20.  Mild thrombocytopenia cont to monitor   21. New cough/stuffy nose  Continue add Flonase, Tessalon TID 100 mg and Robitussin prn- d/w nursing about plans- and if Sx's get worse at all, need further work up.   22. Urinary retention + UTI : UA/UC + WBC and many bact -, cx + enterococcus fec changed abx  to amox      LOS: 7 days A FACE TO FACE EVALUATION WAS PERFORMED  Erick Colace 02/25/2023, 8:25 AM

## 2023-02-25 NOTE — Progress Notes (Signed)
Inpatient Rehabilitation Discharge Medication Review by a Pharmacist  A complete drug regimen review was completed for this patient to identify any potential clinically significant medication issues.  High Risk Drug Classes Is patient taking? Indication by Medication  Antipsychotic No   Anticoagulant Yes Apixaban -  h/o DVT, CVA, Atrial fibrillation  Antibiotic Yes Amoxicillin (for 5 more days) - UTI  Opioid No   Antiplatelet No   Hypoglycemics/insulin Yes Insulin glargine, Empagliflozin, semaglutide - DM   Vasoactive Medication Yes Metoprolol succinate - hypertension, atrial fibrillation Amiodarone- Afib, SVT   Chemotherapy No   Other Yes Atorvastatin, ezetimibe - hyperlipidemia Finasteride, tamsulosin - BPH, urinary retention  Fluticasone nasal - congestion Melatonin - sleep  PRNs: Acetaminophen - mild pain Guaifenesin- dextromethorphan - cough     Type of Medication Issue Identified Description of Issue Recommendation(s)  Drug Interaction(s) (clinically significant)     Duplicate Therapy     Allergy     No Medication Administration End Date     Incorrect Dose     Additional Drug Therapy Needed     Significant med changes from prior encounter (inform family/care partners about these prior to discharge). PTA Entresto and aspirin 81 mg were discontinued during inpatient admission.  Prior insulin detemir, and NPH. Staying off Entresto and Aspirin 81 mg.   Discharging on insulin glargine.   Other       Clinically significant medication issues were identified that warrant physician communication and completion of prescribed/recommended actions by midnight of the next day:  No   Pharmacist comments:   Time spent performing this drug regimen review (minutes):  20 minutes   Dennie Fetters, RPh 02/25/2023 10:49 AM

## 2023-02-25 NOTE — Progress Notes (Signed)
Inpatient Rehabilitation Care Coordinator Discharge Note   Patient Details  Name: Shane Holloway MRN: [id number] Date of Birth: December 19, 1950   Discharge location: Home  Length of Stay: 7 Days  Discharge activity level: Sup/Cga  Home/community participation: Niece, nephew and other family  Patient response NG:EXBMWU Literacy - How often do you need to have someone help you when you read instructions, pamphlets, or other written material from your doctor or pharmacy?: Always  Patient response XL:KGMWNU Isolation - How often do you feel lonely or isolated from those around you?: Never  Services provided included: SW, Pharmacy, TR, CM, RN, SLP, OT, PT, RD, MD  Financial Services:  Financial Services Utilized: Private Insurance [employer]  Choices offered to/list presented to: Patient and niece  Follow-up services arranged:  DME, Home Health Home Health Agency: [facility]    DME : none    Patient response to transportation need: Is the patient able to respond to transportation needs?: Yes In the past 12 months, has lack of transportation kept you from medical appointments or from getting medications?: No In the past 12 months, has lack of transportation kept you from meetings, work, or from getting things needed for daily living?: No   Patient/Family verbalized understanding of follow-up arrangements:  Yes  Individual responsible for coordination of the follow-up plan: niece, [name]  Confirmed correct DME delivered: [name] 02/25/2023    Comments (or additional information):  Summary of Stay    Date/Time Discharge Planning CSW  02/19/23 2725 New admission, assesment pending. CJB       [name]

## 2023-02-26 ENCOUNTER — Telehealth: Payer: Self-pay

## 2023-02-26 NOTE — Telephone Encounter (Signed)
Transition Care Management Unsuccessful Follow-up Telephone Call  Date of discharge and from where:  02/25/2023 Inpatient Rehab Unit  Attempts:  1st Attempt  Reason for unsuccessful TCM follow-up call:  Left voice message

## 2023-02-27 NOTE — Telephone Encounter (Signed)
Transition Care Management Unsuccessful Follow-up Telephone Call  Date of discharge and from where:  02/25/23 [facility]  Attempts:  2nd Attempt  Reason for unsuccessful TCM follow-up call:  Left voice message

## 2023-02-27 NOTE — Progress Notes (Signed)
Patient ID: Shane Holloway, male   DOB: 01/23/1951, 72 y.o.   MRN: [id number]  *Late entry  Patient assisted off the unit by niece to visit brother who is in hospice care. Social Work and [employer] aware of situation and allotted patient to visit brother. Patient will remain in wheelchair and return within 2 hours. PT assist patient to car and NT assisted patient back to unit.

## 2023-03-11 ENCOUNTER — Encounter: Payer: Medicare HMO | Admitting: Registered Nurse

## 2023-03-24 ENCOUNTER — Emergency Department (HOSPITAL_COMMUNITY)
Admission: EM | Admit: 2023-03-24 | Discharge: 2023-03-24 | Disposition: A | Discharge status: Home / Self Care | Payer: Medicare HMO | Attending: Emergency Medicine | Admitting: Emergency Medicine

## 2023-03-24 ENCOUNTER — Emergency Department (HOSPITAL_COMMUNITY): Payer: Medicare HMO

## 2023-03-24 DIAGNOSIS — R1110 Vomiting, unspecified: Secondary | ICD-10-CM

## 2023-03-24 DIAGNOSIS — Z1152 Encounter for screening for COVID-19: Secondary | ICD-10-CM | POA: Diagnosis not present

## 2023-03-24 DIAGNOSIS — R0602 Shortness of breath: Secondary | ICD-10-CM | POA: Diagnosis not present

## 2023-03-24 DIAGNOSIS — N390 Urinary tract infection, site not specified: Secondary | ICD-10-CM | POA: Diagnosis not present

## 2023-03-24 DIAGNOSIS — N189 Chronic kidney disease, unspecified: Secondary | ICD-10-CM | POA: Diagnosis not present

## 2023-03-24 DIAGNOSIS — Z20822 Contact with and (suspected) exposure to covid-19: Secondary | ICD-10-CM | POA: Insufficient documentation

## 2023-03-24 LAB — CBC WITH DIFFERENTIAL/PLATELET
Abs Immature Granulocytes: 0.03 10*3/uL (ref 0.00–0.07)
Basophils Absolute: 0.1 10*3/uL (ref 0.0–0.1)
Basophils Relative: 1 %
Eosinophils Absolute: 0.2 10*3/uL (ref 0.0–0.5)
Eosinophils Relative: 2 %
HCT: 42.8 % (ref 39.0–52.0)
Hemoglobin: 14.3 g/dL (ref 13.0–17.0)
Immature Granulocytes: 0 %
Lymphocytes Relative: 21 %
Lymphs Abs: 1.7 10*3/uL (ref 0.7–4.0)
MCH: 28.3 pg (ref 26.0–34.0)
MCHC: 33.4 g/dL (ref 30.0–36.0)
MCV: 84.6 fL (ref 80.0–100.0)
Monocytes Absolute: 0.4 10*3/uL (ref 0.1–1.0)
Monocytes Relative: 5 %
Neutro Abs: 5.6 10*3/uL (ref 1.7–7.7)
Neutrophils Relative %: 71 %
Platelets: 178 10*3/uL (ref 150–400)
RBC: 5.06 MIL/uL (ref 4.22–5.81)
RDW: 14 % (ref 11.5–15.5)
WBC: 7.9 10*3/uL (ref 4.0–10.5)
nRBC: 0 % (ref 0.0–0.2)

## 2023-03-24 LAB — URINALYSIS, ROUTINE W REFLEX MICROSCOPIC
Bilirubin Urine: NEGATIVE
Glucose, UA: >=500 mg/dL — AB
Ketones, ur: NEGATIVE mg/dL
Nitrite: NEGATIVE
Protein, ur: 100 mg/dL — AB
Specific Gravity, Urine: 1.013 (ref 1.005–1.030)
WBC, UA: >50 WBC/hpf (ref 0–5)
pH: 5 (ref 5.0–8.0)

## 2023-03-24 LAB — RESP PANEL BY RT-PCR (RSV, FLU A&B, COVID)  RVPGX2
Influenza A by PCR: NEGATIVE
Influenza B by PCR: NEGATIVE
Resp Syncytial Virus by PCR: NEGATIVE
SARS Coronavirus 2 by RT PCR: NEGATIVE

## 2023-03-24 LAB — COMPREHENSIVE METABOLIC PANEL
ALT: 21 U/L (ref 0–44)
AST: 19 U/L (ref 15–41)
Albumin: 3.7 g/dL (ref 3.5–5.0)
Alkaline Phosphatase: 96 U/L (ref 38–126)
Anion gap: 11 (ref 5–15)
BUN: 33 mg/dL — ABNORMAL HIGH (ref 8–23)
CO2: 23 mmol/L (ref 22–32)
Calcium: 9.4 mg/dL (ref 8.9–10.3)
Chloride: 103 mmol/L (ref 98–111)
Creatinine, Ser: 3 mg/dL — ABNORMAL HIGH (ref 0.61–1.24)
GFR, Estimated: 21 mL/min — ABNORMAL LOW (ref >60)
Glucose, Bld: 157 mg/dL — ABNORMAL HIGH (ref 70–99)
Potassium: 3.2 mmol/L — ABNORMAL LOW (ref 3.5–5.1)
Sodium: 137 mmol/L (ref 135–145)
Total Bilirubin: 1.1 mg/dL (ref 0.3–1.2)
Total Protein: 6.4 g/dL — ABNORMAL LOW (ref 6.5–8.1)

## 2023-03-24 LAB — LIPASE, BLOOD: Lipase: 44 U/L (ref 11–51)

## 2023-03-24 LAB — TROPONIN I (HIGH SENSITIVITY)
Troponin I (High Sensitivity): 25 ng/L — ABNORMAL HIGH (ref <18)
Troponin I (High Sensitivity): 24 ng/L — ABNORMAL HIGH (ref <18)

## 2023-03-24 MED ORDER — ONDANSETRON HCL 4 MG/2ML IJ SOLN
4.0000 mg | Freq: Once | INTRAMUSCULAR | Status: AC | PRN
  Administered 2023-03-24: 4 mg via INTRAVENOUS
  Filled 2023-03-24: qty 2

## 2023-03-24 MED ORDER — ONDANSETRON 4 MG PO TBDP: 4.0000 mg | ORAL_TABLET | Freq: Three times a day (TID) | ORAL | 0 refills | Status: DC | PRN

## 2023-03-24 MED ORDER — CEPHALEXIN 250 MG PO CAPS
250.0000 mg | ORAL_CAPSULE | Freq: Once | ORAL | Status: AC
  Administered 2023-03-24: 250 mg via ORAL
  Filled 2023-03-24: qty 1

## 2023-03-24 MED ORDER — CEPHALEXIN 250 MG PO CAPS: 250.0000 mg | ORAL_CAPSULE | Freq: Three times a day (TID) | ORAL | 0 refills | Status: AC

## 2023-03-24 MED ORDER — ACETAMINOPHEN 325 MG PO TABS
650.0000 mg | ORAL_TABLET | Freq: Once | ORAL | Status: AC
  Administered 2023-03-24: 650 mg via ORAL
  Filled 2023-03-24: qty 2

## 2023-03-24 MED ORDER — POTASSIUM CHLORIDE CRYS ER 20 MEQ PO TBCR
40.0000 meq | EXTENDED_RELEASE_TABLET | Freq: Once | ORAL | Status: AC
  Administered 2023-03-24: 40 meq via ORAL
  Filled 2023-03-24: qty 2

## 2023-03-24 NOTE — Discharge Instructions (Addendum)
We are treating you for a urinary tract infection.  You will need to follow-up with your primary care physician.  If you develop new or worsening vomiting, abdominal pain, or any other new/concerning symptoms then return to the ER or call 911.

## 2023-03-24 NOTE — ED Notes (Signed)
Pt educated to take antibiotics for entire tx regimen. Pt voiced understanding.

## 2023-03-24 NOTE — ED Provider Triage Note (Signed)
Emergency Medicine Provider Triage Evaluation Note  Shane Holloway , a 72 y.o. male  was evaluated in triage.  Pt complains of neck pain, nausea, vomiting, and shortness of breath.  Patient began having nausea and vomiting yesterday, and his blood pressure has been going up and down sporadically.  Patient began having shortness of breath this morning and neck pain in the center back of his neck.  Concerned that patient has a history of stroke and symptoms usually begin his neck pain.  He reports his neck pain has resolved and he denies facial droop, numbness, tingling, weakness in the extremities.  He does have vascular dementia per family member.  He is mentating at his baseline.  Denies abdominal pain, diarrhea, constipation, fever, chills.  Review of Systems  Positive: As above Negative: As above  Physical Exam  BP 91/75 (BP Location: Right Arm)   Pulse (!) 103   Temp 98.7 F (37.1 C)   Resp 17   SpO2 96%  Gen:   Awake, no distress   Resp:  Normal effort  MSK:   Moves extremities without difficulty  Other:  No facial droop.  Equal grip strength and sensation bilaterally.  Patient is moving all extremities appropriately.  Speech is clear and not slurred.  Medical Decision Making  Medically screening exam initiated at 1:40 PM.  Appropriate orders placed.  Theresa Duty was informed that the remainder of the evaluation will be completed by another provider, this initial triage assessment does not replace that evaluation, and the importance of remaining in the ED until their evaluation is complete.     Melton Alar R, PA-C 03/24/23 1342

## 2023-03-24 NOTE — ED Provider Notes (Signed)
[place] EMERGENCY DEPARTMENT AT [facility] Provider Note   CSN: [id number] Arrival date & time: 03/24/23  1316     History  Chief Complaint  Patient presents with   Emesis   Shortness of Breath   Hypertension   Neck Pain    Shane Holloway is a 72 y.o. male.  HPI 72 year old male presents with vomiting.  History is primarily from family member at the bedside.  Patient's been vomiting multiple times since yesterday.  He has been feeling diffusely weak.  This morning he had some neck pain which got them concerned because he has had previous strokes and some neck issues and then his blood pressure was very high.  His seem to come back down to normal and that his neck pain went away.  He also had transient dyspnea this morning but that is also gone.  The dyspnea is something that does seem to come and go from time to time.  No abdominal pain but last time he had vomiting like this he had a small bowel obstruction.  He is not sure when his last BM was.  No complaints of chest pain. Some issues with memory due to vascular dementia.  Home Medications Prior to Admission medications   Medication Sig Start Date End Date Taking? Authorizing Provider  acetaminophen (TYLENOL) 325 MG tablet Take 1-2 tablets (325-650 mg total) by mouth every 4 (four) hours as needed for mild pain. 02/25/23  Yes Setzer, Lynnell Jude, PA-C  amiodarone (PACERONE) 200 MG tablet Take 1 tablet by mouth daily. 04/17/21 03/24/23 Yes [provider]  apixaban (ELIQUIS) 5 MG TABS tablet Take 1 tablet (5 mg total) by mouth 2 (two) times daily. 02/18/23  Yes Dorcas Carrow, MD  atorvastatin (LIPITOR) 80 MG tablet Take 80 mg by mouth daily. 12/12/20  Yes [provider]  cephALEXin (KEFLEX) 250 MG capsule Take 1 capsule (250 mg total) by mouth 3 (three) times daily for 7 days. 03/24/23 03/31/23 Yes Pricilla Loveless, MD  empagliflozin (JARDIANCE) 10 MG TABS tablet Take 1 tablet by mouth daily. 08/08/21  Yes  [provider]  ezetimibe (ZETIA) 10 MG tablet Take 10 mg by mouth daily.   Yes [provider]  finasteride (PROSCAR) 5 MG tablet Take 5 mg by mouth daily. 09/05/22  Yes [provider]  guaiFENesin-dextromethorphan (ROBITUSSIN DM) 100-10 MG/5ML syrup Take 10 mLs by mouth every 4 (four) hours as needed for cough. 02/25/23  Yes Setzer, Lynnell Jude, PA-C  insulin glargine (LANTUS) 100 UNIT/ML Solostar Pen Inject 10 Units into the skin daily. 02/25/23  Yes Setzer, Lynnell Jude, PA-C  metoprolol succinate (TOPROL-XL) 50 MG 24 hr tablet Take 50 mg by mouth daily.   Yes [provider]  nitroGLYCERIN (NITROSTAT) 0.4 MG SL tablet Place under the tongue. 01/23/22  Yes [provider]  ondansetron (ZOFRAN-ODT) 4 MG disintegrating tablet Take 1 tablet (4 mg total) by mouth every 8 (eight) hours as needed for nausea or vomiting. 03/24/23  Yes Pricilla Loveless, MD  Semaglutide,0.25 or 0.5MG /DOS, (OZEMPIC, 0.25 OR 0.5 MG/DOSE,) 2 MG/3ML SOPN Inject into the skin. Mondays   Yes [provider]  tamsulosin (FLOMAX) 0.4 MG CAPS capsule Take 1 capsule (0.4 mg total) by mouth daily. 02/26/23  Yes Setzer, Lynnell Jude, PA-C  Insulin Pen Needle (AURORA PEN NEEDLES) 31G X 6 MM MISC 1 each by Does not apply route daily at 2 PM. 02/25/23   Setzer, Lynnell Jude, PA-C      Allergies  Altace [ramipril], Shellfish allergy, and Sulfa antibiotics    Review of Systems   Review of Systems  Respiratory:  Positive for shortness of breath.   Gastrointestinal:  Positive for vomiting. Negative for abdominal pain.    Physical Exam Updated Vital Signs BP 110/73   Pulse 97   Temp 97.9 F (36.6 C) (Oral)   Resp 14   SpO2 98%  Physical Exam Vitals and nursing note reviewed.  Constitutional:      Appearance: He is well-developed.  HENT:     Head: Normocephalic and atraumatic.  Eyes:     Extraocular Movements: Extraocular movements intact.  Cardiovascular:     Rate and Rhythm: Normal  rate and regular rhythm.     Heart sounds: Normal heart sounds.  Pulmonary:     Effort: Pulmonary effort is normal.     Breath sounds: Normal breath sounds.  Abdominal:     General: There is no distension.     Palpations: Abdomen is soft.     Tenderness: There is no abdominal tenderness.  Skin:    General: Skin is warm and dry.  Neurological:     Mental Status: He is alert.     Comments: CN 3-12 grossly intact. 5/5 strength in all 4 extremities. Normal finger to nose.      ED Results / Procedures / Treatments   Labs (all labs ordered are listed, but only abnormal results are displayed) Labs Reviewed  COMPREHENSIVE METABOLIC PANEL - Abnormal; Notable for the following components:      Result Value   Potassium 3.2 (*)    Glucose, Bld 157 (*)    BUN 33 (*)    Creatinine, Ser 3.00 (*)    Total Protein 6.4 (*)    GFR, Estimated 21 (*)    All other components within normal limits  URINALYSIS, ROUTINE W REFLEX MICROSCOPIC - Abnormal; Notable for the following components:   APPearance CLOUDY (*)    Glucose, UA >=500 (*)    Hgb urine dipstick SMALL (*)    Protein, ur 100 (*)    Leukocytes,Ua LARGE (*)    Bacteria, UA FEW (*)    All other components within normal limits  TROPONIN I (HIGH SENSITIVITY) - Abnormal; Notable for the following components:   Troponin I (High Sensitivity) 25 (*)    All other components within normal limits  TROPONIN I (HIGH SENSITIVITY) - Abnormal; Notable for the following components:   Troponin I (High Sensitivity) 24 (*)    All other components within normal limits  RESP PANEL BY RT-PCR (RSV, FLU A&B, COVID)  [id number]  URINE CULTURE  LIPASE, BLOOD  CBC WITH DIFFERENTIAL/PLATELET    EKG EKG Interpretation Date/Time:  Monday March 24 2023 13:37:31 EDT Ventricular Rate:  101 PR Interval:    QRS Duration:  150 QT Interval:  410 QTC Calculation: 531 R Axis:   -9  Text Interpretation: Wide QRS rhythm Non-specific intra-ventricular conduction  block Possible Lateral infarct , age undetermined  ST/T changes similar to Sept 4 2024 Confirmed by Pricilla Loveless [phone]) on 03/24/2023 4:04:29 PM  Radiology CT ABDOMEN PELVIS WO CONTRAST  Result Date: 03/24/2023 CLINICAL DATA:  Bowel obstruction suspected.  Emesis. EXAM: CT ABDOMEN AND PELVIS WITHOUT CONTRAST TECHNIQUE: Multidetector CT imaging of the abdomen and pelvis was performed following the standard protocol without IV contrast. RADIATION DOSE REDUCTION: This exam was performed according to the departmental dose-optimization program which includes automated exposure control, adjustment of the mA and/or kV according to patient size  and/or use of iterative reconstruction technique. COMPARISON:  CT abdomen and pelvis 02/12/2023. FINDINGS: Lower chest: No acute abnormality. Hepatobiliary: No focal liver abnormality is seen. Status post cholecystectomy. No biliary dilatation. Pancreas: Unremarkable. No pancreatic ductal dilatation or surrounding inflammatory changes. Spleen: Normal in size without focal abnormality. Adrenals/Urinary Tract: There are punctate nonobstructing bilateral renal calculi. There is no hydronephrosis. There is a cortical cyst in the left kidney measuring less than 1 cm which has decreased in size. Cortical cyst in the superior pole the right kidney measures 1 cm and is unchanged. The adrenal glands and bladder are within normal limits. Stomach/Bowel: Stomach is within normal limits. Appendix appears normal. No evidence of bowel wall thickening, distention, or inflammatory changes. Vascular/Lymphatic: Aortic atherosclerosis. No enlarged abdominal or pelvic lymph nodes. Reproductive: Prostate gland is enlarged. Other: No abdominal wall hernia or abnormality. No abdominopelvic ascites. Musculoskeletal: Degenerative changes affect the spine. IMPRESSION: 1. No acute localizing process in the abdomen or pelvis. 2. Nonobstructing bilateral renal calculi. 3. Prostatomegaly. 4. Aortic  atherosclerosis. Aortic Atherosclerosis (ICD10-I70.0). Electronically Signed   By: Darliss Cheney M.D.   On: 03/24/2023 21:16   DG Chest 2 View  Result Date: 03/24/2023 CLINICAL DATA:  Shortness of breath EXAM: CHEST - 2 VIEW COMPARISON:  02/12/2023 FINDINGS: Post sternotomy changes and valve prosthesis. No acute airspace disease or effusion. Stable cardiomediastinal silhouette with aortic atherosclerosis. No pneumothorax. IMPRESSION: No active cardiopulmonary disease. Electronically Signed   By: Jasmine Pang M.D.   On: 03/24/2023 16:27    Procedures Procedures    Medications Ordered in ED Medications  ondansetron (ZOFRAN) injection 4 mg (4 mg Intravenous Given 03/24/23 1736)  acetaminophen (TYLENOL) tablet 650 mg (650 mg Oral Given 03/24/23 2127)  potassium chloride SA (KLOR-CON M) CR tablet 40 mEq (40 mEq Oral Given 03/24/23 2127)  cephALEXin (KEFLEX) capsule 250 mg (250 mg Oral Given 03/24/23 2246)    ED Course/ Medical Decision Making/ A&P                                 Medical Decision Making Amount and/or Complexity of Data Reviewed Independent Historian:     Details: Daughter Labs: ordered.    Details: Stable CKD.  UA positive for UTI. Radiology: ordered and independent interpretation performed.    Details: No CHF.  No bowel obstruction. ECG/medicine tests: ordered and independent interpretation performed.    Details: Unchanged from baseline.  Risk OTC drugs. Prescription drug management.   Patient has had vomiting but no vomiting in the ED over several hours.  CT does not show a bowel obstruction.  Labs are near his baseline.  UA does look like a concern for a UTI and so he will be treated with antibiotics.  These will be dose adjusted for his CKD which his creatinine clearance is 29.  Otherwise, he has tolerated this and potassium here and appears stable for discharge home with return precautions.        Final Clinical Impression(s) / ED Diagnoses Final  diagnoses:  Vomiting in adult  Acute urinary tract infection    Rx / DC Orders ED Discharge Orders          Ordered    ondansetron (ZOFRAN-ODT) 4 MG disintegrating tablet  Every 8 hours PRN        03/24/23 2227    cephALEXin (KEFLEX) 250 MG capsule  3 times daily        03/24/23 2227  Pricilla Loveless, MD 03/24/23 [phone]

## 2023-03-24 NOTE — ED Triage Notes (Signed)
Pt to ED POV from home. Pt states he began vomiting yesterday and states his BP has been elevated since yesterday. Pt also began c/o SOB this morning. Pt also endorses neck pain in center back of neck since this morning. Pt states he has a hx of stroke and states his symptoms today is how he presented last time. Pt denies any numbness / tingling. No facial droop. Equal grips and sensation. Pt altered to time in triage. Pt does have hx of dementia. Pt also has hx of SBO, but denies any abdominal pain today.

## 2023-03-24 NOTE — ED Notes (Signed)
Report received from Belenda Cruise RN. Assumed care of pt at this time.

## 2023-03-26 LAB — URINE CULTURE: Culture: >=100000 — AB

## 2023-03-27 ENCOUNTER — Telehealth (HOSPITAL_BASED_OUTPATIENT_CLINIC_OR_DEPARTMENT_OTHER): Payer: Self-pay

## 2023-03-27 NOTE — Telephone Encounter (Signed)
Post ED Visit - Positive Culture Follow-up  Culture report reviewed by antimicrobial stewardship pharmacist: [facility] []  Enzo Bi, Pharm.D. []  Celedonio Miyamoto, [address].D., BCPS AQ-ID [x]  Wilburn Cornelia, Pharm.D., BCPS []  Georgina Pillion, Pharm.D., BCPS []  [place], [place].D., BCPS, AAHIVP []  Estella Husk, Pharm.D., BCPS, AAHIVP []  Lysle Pearl, PharmD, BCPS []  Phillips Climes, PharmD, BCPS []  Agapito Games, PharmD, BCPS []  Verlan Friends, PharmD []  Mervyn Gay, PharmD, BCPS []  Vinnie Level, PharmD  [facility] []  Len Childs, PharmD []  Greer Pickerel, PharmD []  Adalberto Cole, PharmD []  Perlie Gold, Rph []  Lonell Face) Jean Rosenthal, PharmD []  Earl Many, PharmD []  Junita Push, PharmD []  Dorna Leitz, PharmD []  Terrilee Files, PharmD []  Lynann Beaver, PharmD []  Keturah Barre, PharmD []  Loralee Pacas, PharmD []  Bernadene Person, PharmD   Positive urine culture Treated with Cephalexin, organism sensitive to the same and no further patient follow-up is required at this time.  Sandria Senter 03/27/2023, 12:10 PM

## 2023-06-16 ENCOUNTER — Institutional Professional Consult (permissible substitution): Payer: Medicare HMO | Admitting: Neurology

## 2023-06-30 ENCOUNTER — Emergency Department (HOSPITAL_COMMUNITY): Payer: Medicare HMO

## 2023-06-30 ENCOUNTER — Inpatient Hospital Stay (HOSPITAL_COMMUNITY)
Admission: EM | Admit: 2023-06-30 | Discharge: 2023-07-02 | DRG: 309 | Disposition: A | Discharge status: Home / Self Care | Payer: PPO | Attending: Internal Medicine | Admitting: Internal Medicine

## 2023-06-30 ENCOUNTER — Other Ambulatory Visit: Payer: Self-pay

## 2023-06-30 ENCOUNTER — Encounter (HOSPITAL_COMMUNITY): Payer: Self-pay

## 2023-06-30 DIAGNOSIS — Z86718 Personal history of other venous thrombosis and embolism: Secondary | ICD-10-CM

## 2023-06-30 DIAGNOSIS — N184 Chronic kidney disease, stage 4 (severe): Secondary | ICD-10-CM | POA: Diagnosis present

## 2023-06-30 DIAGNOSIS — Z91013 Allergy to seafood: Secondary | ICD-10-CM

## 2023-06-30 DIAGNOSIS — Z79899 Other long term (current) drug therapy: Secondary | ICD-10-CM | POA: Diagnosis not present

## 2023-06-30 DIAGNOSIS — Z955 Presence of coronary angioplasty implant and graft: Secondary | ICD-10-CM | POA: Diagnosis not present

## 2023-06-30 DIAGNOSIS — I69318 Other symptoms and signs involving cognitive functions following cerebral infarction: Secondary | ICD-10-CM

## 2023-06-30 DIAGNOSIS — I25810 Atherosclerosis of coronary artery bypass graft(s) without angina pectoris: Secondary | ICD-10-CM | POA: Diagnosis present

## 2023-06-30 DIAGNOSIS — Z87440 Personal history of urinary (tract) infections: Secondary | ICD-10-CM

## 2023-06-30 DIAGNOSIS — Z66 Do not resuscitate: Secondary | ICD-10-CM | POA: Diagnosis present

## 2023-06-30 DIAGNOSIS — I447 Left bundle-branch block, unspecified: Secondary | ICD-10-CM | POA: Diagnosis present

## 2023-06-30 DIAGNOSIS — Z8616 Personal history of COVID-19: Secondary | ICD-10-CM | POA: Diagnosis not present

## 2023-06-30 DIAGNOSIS — Z882 Allergy status to sulfonamides status: Secondary | ICD-10-CM

## 2023-06-30 DIAGNOSIS — I2510 Atherosclerotic heart disease of native coronary artery without angina pectoris: Secondary | ICD-10-CM | POA: Diagnosis present

## 2023-06-30 DIAGNOSIS — Z8249 Family history of ischemic heart disease and other diseases of the circulatory system: Secondary | ICD-10-CM

## 2023-06-30 DIAGNOSIS — I130 Hypertensive heart and chronic kidney disease with heart failure and stage 1 through stage 4 chronic kidney disease, or unspecified chronic kidney disease: Secondary | ICD-10-CM | POA: Diagnosis present

## 2023-06-30 DIAGNOSIS — Z7985 Long-term (current) use of injectable non-insulin antidiabetic drugs: Secondary | ICD-10-CM

## 2023-06-30 DIAGNOSIS — Z794 Long term (current) use of insulin: Secondary | ICD-10-CM | POA: Diagnosis not present

## 2023-06-30 DIAGNOSIS — I10 Essential (primary) hypertension: Secondary | ICD-10-CM | POA: Diagnosis present

## 2023-06-30 DIAGNOSIS — I5022 Chronic systolic (congestive) heart failure: Secondary | ICD-10-CM | POA: Diagnosis present

## 2023-06-30 DIAGNOSIS — I5020 Unspecified systolic (congestive) heart failure: Secondary | ICD-10-CM

## 2023-06-30 DIAGNOSIS — N400 Enlarged prostate without lower urinary tract symptoms: Secondary | ICD-10-CM | POA: Diagnosis present

## 2023-06-30 DIAGNOSIS — F0150 Vascular dementia without behavioral disturbance: Secondary | ICD-10-CM | POA: Diagnosis present

## 2023-06-30 DIAGNOSIS — I471 Supraventricular tachycardia, unspecified: Secondary | ICD-10-CM | POA: Diagnosis present

## 2023-06-30 DIAGNOSIS — I25119 Atherosclerotic heart disease of native coronary artery with unspecified angina pectoris: Secondary | ICD-10-CM | POA: Diagnosis not present

## 2023-06-30 DIAGNOSIS — Z888 Allergy status to other drugs, medicaments and biological substances status: Secondary | ICD-10-CM

## 2023-06-30 DIAGNOSIS — Z7901 Long term (current) use of anticoagulants: Secondary | ICD-10-CM | POA: Diagnosis not present

## 2023-06-30 DIAGNOSIS — Z8673 Personal history of transient ischemic attack (TIA), and cerebral infarction without residual deficits: Secondary | ICD-10-CM

## 2023-06-30 DIAGNOSIS — Z7984 Long term (current) use of oral hypoglycemic drugs: Secondary | ICD-10-CM

## 2023-06-30 DIAGNOSIS — E1122 Type 2 diabetes mellitus with diabetic chronic kidney disease: Secondary | ICD-10-CM | POA: Diagnosis present

## 2023-06-30 DIAGNOSIS — Z951 Presence of aortocoronary bypass graft: Secondary | ICD-10-CM

## 2023-06-30 DIAGNOSIS — I4719 Other supraventricular tachycardia: Secondary | ICD-10-CM | POA: Diagnosis present

## 2023-06-30 DIAGNOSIS — R079 Chest pain, unspecified: Principal | ICD-10-CM

## 2023-06-30 DIAGNOSIS — Z87891 Personal history of nicotine dependence: Secondary | ICD-10-CM | POA: Diagnosis not present

## 2023-06-30 DIAGNOSIS — Z953 Presence of xenogenic heart valve: Secondary | ICD-10-CM

## 2023-06-30 DIAGNOSIS — E1165 Type 2 diabetes mellitus with hyperglycemia: Secondary | ICD-10-CM | POA: Diagnosis present

## 2023-06-30 DIAGNOSIS — R0602 Shortness of breath: Secondary | ICD-10-CM | POA: Diagnosis present

## 2023-06-30 DIAGNOSIS — I25708 Atherosclerosis of coronary artery bypass graft(s), unspecified, with other forms of angina pectoris: Secondary | ICD-10-CM

## 2023-06-30 DIAGNOSIS — I252 Old myocardial infarction: Secondary | ICD-10-CM

## 2023-06-30 DIAGNOSIS — E785 Hyperlipidemia, unspecified: Secondary | ICD-10-CM | POA: Diagnosis present

## 2023-06-30 DIAGNOSIS — M1990 Unspecified osteoarthritis, unspecified site: Secondary | ICD-10-CM | POA: Diagnosis present

## 2023-06-30 HISTORY — DX: Unspecified systolic (congestive) heart failure: I50.20

## 2023-06-30 HISTORY — DX: Chronic kidney disease, stage 3b: N18.32

## 2023-06-30 HISTORY — DX: Vascular dementia, unspecified severity, without behavioral disturbance, psychotic disturbance, mood disturbance, and anxiety: F01.50

## 2023-06-30 HISTORY — DX: Urinary tract infection, site not specified: N39.0

## 2023-06-30 HISTORY — DX: Supraventricular tachycardia, unspecified: I47.10

## 2023-06-30 HISTORY — DX: Acute embolism and thrombosis of deep veins of unspecified upper extremity: I82.629

## 2023-06-30 HISTORY — DX: Atherosclerotic heart disease of native coronary artery without angina pectoris: I25.10

## 2023-06-30 LAB — BASIC METABOLIC PANEL
Anion gap: 11 (ref 5–15)
BUN: 42 mg/dL — ABNORMAL HIGH (ref 8–23)
CO2: 23 mmol/L (ref 22–32)
Calcium: 9.4 mg/dL (ref 8.9–10.3)
Chloride: 105 mmol/L (ref 98–111)
Creatinine, Ser: 2.86 mg/dL — ABNORMAL HIGH (ref 0.61–1.24)
GFR, Estimated: 22 mL/min — ABNORMAL LOW (ref >60)
Glucose, Bld: 177 mg/dL — ABNORMAL HIGH (ref 70–99)
Potassium: 3.7 mmol/L (ref 3.5–5.1)
Sodium: 139 mmol/L (ref 135–145)

## 2023-06-30 LAB — HEPATIC FUNCTION PANEL
ALT: 19 U/L (ref 0–44)
AST: 18 U/L (ref 15–41)
Albumin: 3.6 g/dL (ref 3.5–5.0)
Alkaline Phosphatase: 93 U/L (ref 38–126)
Bilirubin, Direct: 0.1 mg/dL (ref 0.0–0.2)
Indirect Bilirubin: 0.7 mg/dL (ref 0.3–0.9)
Total Bilirubin: 0.8 mg/dL (ref 0.0–1.2)
Total Protein: 6.4 g/dL — ABNORMAL LOW (ref 6.5–8.1)

## 2023-06-30 LAB — CBC
HCT: 44.9 % (ref 39.0–52.0)
Hemoglobin: 15 g/dL (ref 13.0–17.0)
MCH: 28.5 pg (ref 26.0–34.0)
MCHC: 33.4 g/dL (ref 30.0–36.0)
MCV: 85.4 fL (ref 80.0–100.0)
Platelets: 120 10*3/uL — ABNORMAL LOW (ref 150–400)
RBC: 5.26 MIL/uL (ref 4.22–5.81)
RDW: 14.4 % (ref 11.5–15.5)
WBC: 6.3 10*3/uL (ref 4.0–10.5)
nRBC: 0 % (ref 0.0–0.2)

## 2023-06-30 LAB — LIPASE, BLOOD: Lipase: 39 U/L (ref 11–51)

## 2023-06-30 LAB — TROPONIN I (HIGH SENSITIVITY): Troponin I (High Sensitivity): 16 ng/L (ref <18)

## 2023-06-30 LAB — BRAIN NATRIURETIC PEPTIDE: B Natriuretic Peptide: 293.4 pg/mL — ABNORMAL HIGH (ref 0.0–100.0)

## 2023-06-30 LAB — MAGNESIUM: Magnesium: 2.1 mg/dL (ref 1.7–2.4)

## 2023-06-30 MED ORDER — APIXABAN 5 MG PO TABS
5.0000 mg | ORAL_TABLET | Freq: Two times a day (BID) | ORAL | Status: DC
  Administered 2023-06-30 – 2023-07-02 (×4): 5 mg via ORAL
  Filled 2023-06-30 (×4): qty 1

## 2023-06-30 MED ORDER — AMIODARONE HCL IN DEXTROSE 360-4.14 MG/200ML-% IV SOLN
60.0000 mg/h | INTRAVENOUS | Status: DC
Start: 2023-06-30 — End: 2023-07-01
  Administered 2023-06-30: 60 mg/h via INTRAVENOUS
  Filled 2023-06-30 (×2): qty 200

## 2023-06-30 MED ORDER — AMIODARONE HCL IN DEXTROSE 360-4.14 MG/200ML-% IV SOLN
30.0000 mg/h | INTRAVENOUS | Status: DC
Start: 2023-07-01 — End: 2023-07-02
  Administered 2023-07-01 (×2): 30 mg/h via INTRAVENOUS
  Filled 2023-06-30 (×2): qty 200

## 2023-06-30 MED ORDER — ASPIRIN 81 MG PO CHEW
243.0000 mg | CHEWABLE_TABLET | Freq: Once | ORAL | Status: AC
  Administered 2023-06-30: 243 mg via ORAL
  Filled 2023-06-30: qty 3

## 2023-06-30 MED ORDER — ACETAMINOPHEN 650 MG RE SUPP: 650.0000 mg | Freq: Four times a day (QID) | RECTAL | Status: DC | PRN

## 2023-06-30 MED ORDER — METOPROLOL SUCCINATE ER 50 MG PO TB24
50.0000 mg | ORAL_TABLET | Freq: Every day | ORAL | Status: DC
  Administered 2023-07-01: 50 mg via ORAL
  Filled 2023-06-30: qty 2

## 2023-06-30 MED ORDER — INSULIN ASPART 100 UNIT/ML IJ SOLN
0.0000 [IU] | Freq: Three times a day (TID) | INTRAMUSCULAR | Status: DC
  Administered 2023-07-01 (×2): 2 [IU] via SUBCUTANEOUS
  Administered 2023-07-01 – 2023-07-02 (×2): 1 [IU] via SUBCUTANEOUS

## 2023-06-30 MED ORDER — AMIODARONE LOAD VIA INFUSION
150.0000 mg | Freq: Once | INTRAVENOUS | Status: AC
  Administered 2023-06-30: 150 mg via INTRAVENOUS
  Filled 2023-06-30: qty 83.34

## 2023-06-30 MED ORDER — ACETAMINOPHEN 325 MG PO TABS: 650.0000 mg | ORAL_TABLET | Freq: Four times a day (QID) | ORAL | Status: DC | PRN

## 2023-06-30 MED ORDER — INSULIN GLARGINE-YFGN 100 UNIT/ML ~~LOC~~ SOLN
14.0000 [IU] | Freq: Every day | SUBCUTANEOUS | Status: DC
  Administered 2023-07-01 – 2023-07-02 (×2): 14 [IU] via SUBCUTANEOUS
  Filled 2023-06-30 (×2): qty 0.14

## 2023-06-30 MED ORDER — FINASTERIDE 5 MG PO TABS
5.0000 mg | ORAL_TABLET | Freq: Every day | ORAL | Status: DC
  Administered 2023-07-01 – 2023-07-02 (×2): 5 mg via ORAL
  Filled 2023-06-30 (×2): qty 1

## 2023-06-30 MED ORDER — FUROSEMIDE 10 MG/ML IJ SOLN
40.0000 mg | Freq: Once | INTRAMUSCULAR | Status: AC
  Administered 2023-06-30: 40 mg via INTRAVENOUS
  Filled 2023-06-30: qty 4

## 2023-06-30 MED ORDER — TAMSULOSIN HCL 0.4 MG PO CAPS
0.4000 mg | ORAL_CAPSULE | Freq: Every day | ORAL | Status: DC
  Administered 2023-07-01 – 2023-07-02 (×2): 0.4 mg via ORAL
  Filled 2023-06-30 (×2): qty 1

## 2023-06-30 MED ORDER — EZETIMIBE 10 MG PO TABS
10.0000 mg | ORAL_TABLET | Freq: Every day | ORAL | Status: DC
  Administered 2023-07-01 – 2023-07-02 (×2): 10 mg via ORAL
  Filled 2023-06-30 (×2): qty 1

## 2023-06-30 MED ORDER — ATORVASTATIN CALCIUM 80 MG PO TABS
80.0000 mg | ORAL_TABLET | Freq: Every day | ORAL | Status: DC
  Administered 2023-07-01 – 2023-07-02 (×2): 80 mg via ORAL
  Filled 2023-06-30: qty 2
  Filled 2023-06-30: qty 1

## 2023-06-30 NOTE — ED Triage Notes (Signed)
Pt arrives with c/o SOB that started today. Pt has hx of MI and CHF. Pt reports chest pressure and nausea. Pt denies LE edema.

## 2023-06-30 NOTE — ED Provider Notes (Signed)
[place] EMERGENCY DEPARTMENT AT [facility] Provider Note   CSN: [id number] Arrival date & time: 06/30/23  1753     History  Chief Complaint  Patient presents with   Shortness of Breath    Shane Holloway is a 73 y.o. male.   Shortness of Breath Associated symptoms: chest pain   Patient presents for chest pain and shortness of breath.  Medical history includes DM, HTN, CAD, CHF, CKD, BPH, CVA, DVT, SBO.  He underwent CABG in 2009, aortic root repair with bioprosthetic AVR in 2015, PCI in 2023.  He is followed by cardiology at [facility].  Home medications include amiodarone, Eliquis, metoprolol, Entresto, amlodipine.  He was in his normal state of health earlier today.  Several hours prior to arrival, while at rest, patient had onset of chest pain and shortness of breath.  Chest pain was described as substernal.  At maximum, it was 8/10 in severity.  Pain has since resolved.  He continues to feel short of breath at rest.     Home Medications Prior to Admission medications   Medication Sig Start Date End Date Taking? Authorizing Provider  acetaminophen (TYLENOL) 325 MG tablet Take 1-2 tablets (325-650 mg total) by mouth every 4 (four) hours as needed for mild pain. 02/25/23   Setzer, Lynnell Jude, PA-C  amiodarone (PACERONE) 200 MG tablet Take 1 tablet by mouth daily. 04/17/21 03/24/23  [provider]  apixaban (ELIQUIS) 5 MG TABS tablet Take 1 tablet (5 mg total) by mouth 2 (two) times daily. 02/18/23  Yes Dorcas Carrow, MD  atorvastatin (LIPITOR) 80 MG tablet Take 80 mg by mouth daily. 12/12/20   [provider]  empagliflozin (JARDIANCE) 10 MG TABS tablet Take 1 tablet by mouth daily. 08/08/21   [provider]  ezetimibe (ZETIA) 10 MG tablet Take 10 mg by mouth daily.    [provider]  finasteride (PROSCAR) 5 MG tablet Take 5 mg by mouth daily. 09/05/22   [provider]  guaiFENesin-dextromethorphan (ROBITUSSIN DM) 100-10 MG/5ML syrup  Take 10 mLs by mouth every 4 (four) hours as needed for cough. 02/25/23   Setzer, Lynnell Jude, PA-C  insulin glargine (LANTUS) 100 UNIT/ML Solostar Pen Inject 10 Units into the skin daily. 02/25/23   Setzer, Lynnell Jude, PA-C  Insulin Pen Needle (AURORA PEN NEEDLES) 31G X 6 MM MISC 1 each by Does not apply route daily at 2 PM. 02/25/23   Setzer, Lynnell Jude, PA-C  metoprolol succinate (TOPROL-XL) 50 MG 24 hr tablet Take 50 mg by mouth daily.    [provider]  nitroGLYCERIN (NITROSTAT) 0.4 MG SL tablet Place under the tongue. 01/23/22   [provider]  ondansetron (ZOFRAN-ODT) 4 MG disintegrating tablet Take 1 tablet (4 mg total) by mouth every 8 (eight) hours as needed for nausea or vomiting. 03/24/23   Pricilla Loveless, MD  Semaglutide,0.25 or 0.5MG /DOS, (OZEMPIC, 0.25 OR 0.5 MG/DOSE,) 2 MG/3ML SOPN Inject into the skin. Mondays    [provider]  tamsulosin (FLOMAX) 0.4 MG CAPS capsule Take 1 capsule (0.4 mg total) by mouth daily. 02/26/23   Setzer, Lynnell Jude, PA-C      Allergies    Altace [ramipril], Shellfish allergy, and Sulfa antibiotics    Review of Systems   Review of Systems  Respiratory:  Positive for shortness of breath.   Cardiovascular:  Positive for chest pain.  All other systems reviewed and are negative.   Physical Exam Updated Vital Signs BP 134/76 (BP Location:  Left Arm)   Pulse (!) 112   Temp 97.8 F (36.6 C) (Oral)   Resp (!) 22   Wt 102.1 kg   SpO2 96%   BMI 28.89 kg/m  Physical Exam Vitals and nursing note reviewed.  Constitutional:      General: He is not in acute distress.    Appearance: He is well-developed. He is not ill-appearing, toxic-appearing or diaphoretic.  HENT:     Head: Normocephalic and atraumatic.     Mouth/Throat:     Mouth: Mucous membranes are moist.  Eyes:     Conjunctiva/sclera: Conjunctivae normal.  Cardiovascular:     Rate and Rhythm: Normal rate and regular rhythm.     Heart sounds: No murmur heard. Pulmonary:      Effort: Pulmonary effort is normal. No tachypnea or respiratory distress.     Breath sounds: No decreased breath sounds, wheezing, rhonchi or rales.  Chest:     Chest wall: No tenderness.  Abdominal:     Palpations: Abdomen is soft.     Tenderness: There is no abdominal tenderness.  Musculoskeletal:        General: No swelling.     Cervical back: Neck supple.  Skin:    General: Skin is warm and dry.     Coloration: Skin is not cyanotic or pale.  Neurological:     General: No focal deficit present.     Mental Status: He is alert and oriented to person, place, and time.  Psychiatric:        Mood and Affect: Mood normal.        Behavior: Behavior normal.     ED Results / Procedures / Treatments   Labs (all labs ordered are listed, but only abnormal results are displayed) Labs Reviewed  BASIC METABOLIC PANEL - Abnormal; Notable for the following components:      Result Value   Glucose, Bld 177 (*)    BUN 42 (*)    Creatinine, Ser 2.86 (*)    GFR, Estimated 22 (*)    All other components within normal limits  CBC - Abnormal; Notable for the following components:   Platelets 120 (*)    All other components within normal limits  BRAIN NATRIURETIC PEPTIDE - Abnormal; Notable for the following components:   B Natriuretic Peptide 293.4 (*)    All other components within normal limits  HEPATIC FUNCTION PANEL - Abnormal; Notable for the following components:   Total Protein 6.4 (*)    All other components within normal limits  LIPASE, BLOOD  MAGNESIUM  TROPONIN I (HIGH SENSITIVITY)  TROPONIN I (HIGH SENSITIVITY)    EKG EKG Interpretation Date/Time:  Monday June 30 2023 19:04:15 EST Ventricular Rate:  100 PR Interval:    QRS Duration:  140 QT Interval:  401 QTC Calculation: 518 R Axis:   -42  Text Interpretation: Junctional tachycardia Left bundle branch block Confirmed by Gloris Manchester [id number]) on 06/30/2023 7:09:18 PM  Radiology DG Chest 2 View Result Date:  06/30/2023 CLINICAL DATA:  Chest pain EXAM: CHEST - 2 VIEW COMPARISON:  03/24/2023 FINDINGS: Post sternotomy changes and valve prosthesis. No acute airspace disease or pleural effusion. Stable cardiomediastinal silhouette with aortic atherosclerosis. No pneumothorax IMPRESSION: No active cardiopulmonary disease. Electronically Signed   By: Jasmine Pang M.D.   On: 06/30/2023 18:56    Procedures Procedures    Medications Ordered in ED Medications  aspirin chewable tablet 243 mg (has no administration in time range)  furosemide (LASIX) injection 40  mg (has no administration in time range)  amiodarone (NEXTERONE) 1.8 mg/mL load via infusion 150 mg (has no administration in time range)  amiodarone (NEXTERONE PREMIX) 360-4.14 MG/200ML-% (1.8 mg/mL) IV infusion (has no administration in time range)    Followed by  amiodarone (NEXTERONE PREMIX) 360-4.14 MG/200ML-% (1.8 mg/mL) IV infusion (has no administration in time range)  apixaban (ELIQUIS) tablet 5 mg (has no administration in time range)    ED Course/ Medical Decision Making/ A&P                                 Medical Decision Making Amount and/or Complexity of Data Reviewed Labs: ordered. Radiology: ordered.  Risk OTC drugs. Prescription drug management.   This patient presents to the ED for concern of chest pain and shortness of breath, this involves an extensive number of treatment options, and is a complaint that carries with it a high risk of complications and morbidity.  The differential diagnosis includes ACS, arrhythmia, pneumonia, reactive airway disease, CHF exacerbation, anemia, acidosis, other metabolic derangements   Co morbidities that complicate the patient evaluation  DM, HTN, CAD, CHF, CKD, BPH, CVA, DVT, SBO   Additional history obtained:  Additional history obtained from N/A External records from outside source obtained and reviewed including EMR   Lab Tests:  I Ordered, and personally interpreted  labs.  The pertinent results include: Normal hemoglobin, no leukocytosis, baseline creatinine, normal electrolytes.  Initial troponin is normal.  BNP is elevated.   Imaging Studies ordered:  I ordered imaging studies including chest x-ray I independently visualized and interpreted imaging which showed no acute findings I agree with the radiologist interpretation   Cardiac Monitoring: / EKG:  The patient was maintained on a cardiac monitor.  I personally viewed and interpreted the cardiac monitored which showed an underlying rhythm of: Junctional rhythm   Consultations Obtained:  I requested consultation with the cardiologist, Dr. Diona Browner,  and discussed lab and imaging findings as well as pertinent plan - they recommend: Admission to medicine   Problem List / ED Course / Critical interventions / Medication management  Patient presenting for acute onset of chest pain and shortness of breath this afternoon.  Chest pain has since resolved.  He continues to feel short of breath.  On exam, patient is overall well-appearing.  Current breathing is unlabored.  SpO2 is normal on room air.  No apical B-lines are appreciated on bedside ultrasound.  He does appear to have a trace pericardial effusion.  EKG shows slightly widened QRS, consistent with prior EKG. He did take his 81 mg aspirin today.  Will give 243 mg more.  Repeat EKG shows improved heart rate.  At this time, there are no ST elevations.  Initial troponin was normal.  BNP was elevated.  Lasix was ordered for diuresis.  Given his history and concerning symptoms today, cardiology was consulted.  Cardiology evaluated the patient in the ED.  They did order amiodarone bolus and gtt.  They do request a medicine admission.  Patient was admitted for further management. I ordered medication including ASA for possible ACS; Lasix for diuresis Reevaluation of the patient after these medicines showed that the patient improved I have reviewed the  patients home medicines and have made adjustments as needed   Social Determinants of Health:  Has access to outpatient care        Final Clinical Impression(s) / ED Diagnoses Final diagnoses:  Chest  pain, unspecified type    Rx / DC Orders ED Discharge Orders     None         Gloris Manchester, MD 06/30/23 2035

## 2023-06-30 NOTE — Consult Note (Signed)
Cardiology Consultation:   Patient ID: Shane Holloway; [id number]; 02-21-1951   Admit date: 06/30/2023 Date of Consult: 06/30/2023  Primary Care Provider: Kurtis Bushman, MD Primary Cardiologist: [facility]  History of Present Illness:   Shane Holloway is a 73 y.o. male with past medical history outlined below, presenting to the [facility] ER due to onset of shortness of breath and chest tightness at rest while he was at home earlier today.  Does not report any recent exertional chest discomfort or palpitations, states that he has been taking his medications regularly.  He gets help with medications from a family member that lives on his property.  Extensive records reviewed, he follows with both primary care and cardiology at [facility].  Recently completed antibiotic treatment for UTI.  He has a history of multivessel CAD status post CABG and redo operation as discussed below, most recent intervention being DES to Y graft portion of SVG to D1 in the setting of NSTEMI in July 2023.  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  History also complicated by CKD stage IIIb, history of stroke and vascular dementia.  He does not report any obvious leg swelling, uncertain about specific weight change.  Initial workup in the ER shows normal high-sensitivity troponin I of 16, BNP 293, creatinine 2.86 with normal potassium.  Chest x-ray reports no acute process.  Denies any recent cough, no fevers or chills, no obvious sick contacts.  Does not report any palpitations.  Review of his ECG shows what looks to be SVT other than sinus tachycardia.  Possible retrograde P wave in the early ST segment raises possibility of AVRT (postulated on prior admissions and responsive to adenosine reportedly), however cannot entirely exclude an atypical atrial flutter with 2:1 block. ECG from October 2024 shows similar arrhythmia so duration is  unclear.  ROS:  Pertinent review in history of present illness.  Otherwise negative.  Past Medical History:  Diagnosis Date   Allergy    Aortic stenosis    27 mm bioprosthetic Edwards AVR and 28 mm ascending aortic replacement 2015 ([facility])   Arthritis    BPH (benign prostatic hyperplasia)    CAD (coronary artery disease)    CABG 2009 ([facility]) with redo 2015 [facility]) - LIMA to LAD, SVG OM1 and D1, SVG to RPDA; 12/2021 DES to SVG D1   CKD stage 3b, GFR 30-44 ml/min (HCC)    COVID-19    Diabetes mellitus    DVT of upper extremity (deep vein thrombosis) (HCC)    a. 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   HFrEF (heart failure with reduced ejection fraction) (HCC)    LVEF 40-45% 02/2022 [facility])   Hyperlipidemia    Hypertension    Myocardial infarction [facility]) 06/10/2002   NSTEMI (non-ST elevated myocardial infarction) (HCC) 12/10/2021   Recurrent UTI    Stroke (HCC) 09/2020   SVT (supraventricular tachycardia) (HCC)    a. On amio.   Vascular dementia (HCC)    Vertigo    Wears dentures     Past Surgical History:  Procedure Laterality Date   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  Redo of 2V CABG, Aortic Root repair and AVR   CATARACT EXTRACTION W/PHACO Right 11/19/2021   Procedure: CATARACT EXTRACTION PHACO AND INTRAOCULAR LENS PLACEMENT (IOC) RIGHT DIABETIC;  Surgeon: Nevada Crane, MD;  Location: [facility];  Service: Ophthalmology;  Laterality: Right;  7.32 0:54.3   CATARACT EXTRACTION W/PHACO Left 12/03/2021   Procedure: CATARACT EXTRACTION PHACO AND INTRAOCULAR LENS PLACEMENT (IOC) LEFT DIABETIC;  Surgeon: Estanislado Pandy, MD;  Location: [facility];  Service: Ophthalmology;  Laterality: Left;  LEAVE FIRST CASE Diabetic 3.74 00:36.3   CORONARY ARTERY BYPASS GRAFT  2009   x4; stents to diagonal a mid right coronary lesion; drug eluting stent in the mid RCA as well as ostium of the diagonal with a cutting balloon  angioplasty of the diagonal.   CORONARY STENT INTERVENTION N/A 12/14/2021   Procedure: CORONARY STENT INTERVENTION;  Surgeon: Iran Ouch, MD;  Location: [facility] INVASIVE CV LAB;  Service: Cardiovascular;  Laterality: N/A;   CORONARY ULTRASOUND/IVUS N/A 12/14/2021   Procedure: Intravascular Ultrasound/IVUS;  Surgeon: Iran Ouch, MD;  Location: [facility] INVASIVE CV LAB;  Service: Cardiovascular;  Laterality: N/A;   JOINT REPLACEMENT     RIGHT HEART CATH AND CORONARY/GRAFT ANGIOGRAPHY N/A 12/12/2021   Procedure: RIGHT HEART CATH AND CORONARY/GRAFT ANGIOGRAPHY;  Surgeon: Yvonne Kendall, MD;  Location: [facility] INVASIVE CV LAB;  Service: Cardiovascular;  Laterality: N/A;     Outpatient Medications: No current facility-administered medications on file prior to encounter.   Current Outpatient Medications on File Prior to Encounter  Medication Sig Dispense Refill   acetaminophen (TYLENOL) 325 MG tablet Take 1-2 tablets (325-650 mg total) by mouth every 4 (four) hours as needed for mild pain.     amiodarone (PACERONE) 200 MG tablet Take 1 tablet by mouth daily.     apixaban (ELIQUIS) 5 MG TABS tablet Take 1 tablet (5 mg total) by mouth 2 (two) times daily.     atorvastatin (LIPITOR) 80 MG tablet Take 80 mg by mouth daily.     empagliflozin (JARDIANCE) 10 MG TABS tablet Take 1 tablet by mouth daily.     ezetimibe (ZETIA) 10 MG tablet Take 10 mg by mouth daily.     finasteride (PROSCAR) 5 MG tablet Take 5 mg by mouth daily.     guaiFENesin-dextromethorphan (ROBITUSSIN DM) 100-10 MG/5ML syrup Take 10 mLs by mouth every 4 (four) hours as needed for cough.     insulin glargine (LANTUS) 100 UNIT/ML Solostar Pen Inject 10 Units into the skin daily. 5 mL 0   Insulin Pen Needle (AURORA PEN NEEDLES) 31G X 6 MM MISC 1 each by Does not apply route daily at 2 PM. 30 each 0   metoprolol succinate (TOPROL-XL) 50 MG 24 hr tablet Take 50 mg by mouth daily.     nitroGLYCERIN (NITROSTAT) 0.4 MG SL tablet Place under  the tongue.     ondansetron (ZOFRAN-ODT) 4 MG disintegrating tablet Take 1 tablet (4 mg total) by mouth every 8 (eight) hours as needed for nausea or vomiting. 10 tablet 0   Semaglutide,0.25 or 0.5MG /DOS, (OZEMPIC, 0.25 OR 0.5 MG/DOSE,) 2 MG/3ML SOPN Inject into the skin. Mondays     tamsulosin (FLOMAX) 0.4 MG CAPS capsule Take 1 capsule (0.4 mg total) by mouth daily. 30 capsule 0      Allergies:    Allergies  Allergen Reactions   Altace [Ramipril] Cough   Shellfish Allergy Nausea And Vomiting   Sulfa Antibiotics Hives    Social History:   Social History   Tobacco Use   Smoking status: Former    Current packs/day: 0.00    Average packs/day: 0.3 packs/day for 4.0 years (1.0 ttl pk-yrs)    Types: Cigarettes    Start date: 88  Quit date: 72    Years since quitting: 30.0   Smokeless tobacco: Former    Types: Chew    Quit date: 1995  Substance Use Topics   Alcohol use: No    Family History:   The patient's family history includes Heart disease in his father and mother.  Physical Exam/Data:   Vitals:   06/30/23 1810 06/30/23 1811 06/30/23 1812  BP: 134/76    Pulse: (!) 112    Resp: (!) 22    Temp:  97.8 F (36.6 C)   TempSrc:  Oral   SpO2: 96%    Weight:   102.1 kg   No intake or output data in the 24 hours ending 06/30/23 2021 Filed Weights   06/30/23 1812  Weight: 102.1 kg   Body mass index is 28.89 kg/m.   Gen: Patient appears comfortable at rest. HEENT: Conjunctiva and lids normal, oropharynx clear. Neck: Supple, no elevated JVP or carotid bruits. Lungs: Clear to auscultation, nonlabored breathing at rest. Cardiac: Indistinct PMI, RRR without obvious gallop, soft systolic murmur at the base. Abdomen: Soft, nontender, bowel sounds present. Extremities: No pitting edema, distal pulses 2+. Skin: Warm and dry. Musculoskeletal: No kyphosis. Neuropsychiatric: Alert and oriented x3, affect grossly appropriate.  Telemetry:  I personally reviewed  telemetry which shows SVT with heart rate 100-110.  Relevant CV Studies:  Echocardiogram 02/27/2022 [facility]): Summary   1. The left ventricle is normal in size with mildly increased wall  thickness.   2. The left ventricular systolic function is mildly to moderately decreased,  LVEF is visually estimated at 40-45%.    3. There is grade I diastolic dysfunction (impaired relaxation).    4. Aortic valve replacement (27 mm bioprosthetic, implantation date:  12/27/2013).   5. Aortic valve Doppler indices are consistent with normal prosthetic valve  function.   6. The left atrium is mildly dilated in size.    7. The right ventricle is not well visualized but probably normal in size,  with low normal systolic function.   Laboratory Data:  Chemistry Recent Labs  Lab 06/30/23 1825  NA 139  K 3.7  CL 105  CO2 23  GLUCOSE 177*  BUN 42*  CREATININE 2.86*  CALCIUM 9.4  GFRNONAA 22*  ANIONGAP 11    Recent Labs  Lab 06/30/23 1825  PROT 6.4*  ALBUMIN 3.6  AST 18  ALT 19  ALKPHOS 93  BILITOT 0.8   Hematology Recent Labs  Lab 06/30/23 1825  WBC 6.3  RBC 5.26  HGB 15.0  HCT 44.9  MCV 85.4  MCH 28.5  MCHC 33.4  RDW 14.4  PLT 120*   Cardiac Enzymes Recent Labs  Lab 06/30/23 1825  TROPONINIHS 16   BNP Recent Labs  Lab 06/30/23 1825  BNP 293.4*     Lipid Panel     Component Value Date/Time   CHOL 90 01/25/2023 0301   TRIG 188 (H) 01/25/2023 0301   HDL 23 (L) 01/25/2023 0301   CHOLHDL 3.9 01/25/2023 0301   VLDL 38 01/25/2023 0301   LDLCALC 29 01/25/2023 0301   LDLDIRECT 58.1 12/11/2021 0528    Radiology/Studies:  DG Chest 2 View Result Date: 06/30/2023 CLINICAL DATA:  Chest pain EXAM: CHEST - 2 VIEW COMPARISON:  03/24/2023 FINDINGS: Post sternotomy changes and valve prosthesis. No acute airspace disease or pleural effusion. Stable cardiomediastinal silhouette with aortic atherosclerosis. No pneumothorax IMPRESSION: No active cardiopulmonary disease.  Electronically Signed   By: Jasmine Pang M.D.   On: 06/30/2023  18:56    Assessment and Plan:   1.  Presentation with shortness of breath and chest tightness that began at rest earlier today.  Patient denies any associated palpitations although looks to be in SVT by his ECG as discussed above.  Possibly AVRT although somewhat slow rate on current regimen, cannot exclude an atypical atrial flutter with 2:1 block.  Per record review he reportedly responded to adenosine in years past and has been on amiodarone and Toprol-XL at home.  Initial cardiac enzymes do not suggest ACS, BNP mildly elevated but his chest x-ray is clear.  2.  Multivessel CAD status post CABG in 2009 with reoperation in 2015.  Status post LIMA to LAD, SVG to OM1 and D1, and SVG to RPDA.  He is also status post DES to the SVG to D1 in July 2023 in the setting of NSTEMI.  He does not describe any definite worsening exertional angina recently.  3.  HFmrEF, LVEF 40 to 45% by echocardiogram at [facility] in September 2023.  4.  History of aortic stenosis status post 27 mm bioprosthetic AVR with 28 mm aortic root replacement in 2015.  5.  CKD stage IIIb, current creatinine 2.86, possibly acute exacerbation with GFR now down to 22.  6.  History of recurrent UTI including recently.  7.  History of left subclavian DVT, maintained on chronic anticoagulation with Eliquis per recommendation by [facility] hematology based on record review.  8.  History of stroke with vascular dementia.  Recommend trial of adenosine in ER.  We will plan to start IV amiodarone for load following this and ask for EP consultation tomorrow.  At this point do not suspect ACS as far as etiology of presentation, would continue to cycle cardiac enzymes.  Also obtain follow-up echocardiogram to exclude worsening cardiomyopathy and follow-up on status of bioprosthetic AVR.  He was given dose of Lasix in the ER, would hold off on further diuresis for now.  Would stop Jardiance in  the setting of recurrent UTIs including recent infection.  Continue Eliquis, Lipitor, Zetia, and Toprol-XL as tolerated.  ER contacting hospitalist team for admission, we will continue to follow in consultation.  For questions or updates, please contact [facility] Please consult [email or web address] for contact info under   Signed, Nona Dell, MD  06/30/2023 8:21 PM

## 2023-06-30 NOTE — H&P (Signed)
History and Physical    Shane Holloway:[id number] DOB: 11/11/1950 DOA: 06/30/2023  Patient coming from: Home.  Chief Complaint: Shortness of breath and chest pain.  HPI: Shane Holloway is a 73 y.o. male with history of CAD status post CABG and stenting, HFrEF, chronic kidney disease stage IV, diabetes mellitus type 2, vascular dementia, history of DVT on Eliquis, history of CVA was brought to the ER after patient was complaining of shortness of breath since afternoon.  Shortness of breath is present even at rest and on the way to the ER started complaining of chest pressure.  As per the patient's daughter patient has been recently treated for UTI 3 times in the last 3 months.  And has been doing fine over the last 2 to 3 weeks.  When patient started complaining of shortness of breath yesterday.  Has not had any productive cough or fever or chills.  ED Course: In the ER patient was evaluated by cardiologist and felt that patient was in SVT versus atrial flutter.  Was started on amiodarone infusion.  Labs show creatinine of 2.8 BNP of 293 troponins were negative.  Chest x-ray unremarkable.  TSH 3.4.  1 dose of 40 mg IV Lasix was given.  Review of Systems: As per HPI, rest all negative.   Past Medical History:  Diagnosis Date   Allergy    Aortic stenosis    27 mm bioprosthetic Edwards AVR and 28 mm ascending aortic replacement 2015 ([facility])   Arthritis    BPH (benign prostatic hyperplasia)    CAD (coronary artery disease)    CABG 2009 ([facility]) with redo 2015 [facility]) - LIMA to LAD, SVG OM1 and D1, SVG to RPDA; 12/2021 DES to SVG D1   CKD stage 3b, GFR 30-44 ml/min (HCC)    COVID-19    Diabetes mellitus    DVT of upper extremity (deep vein thrombosis) (HCC)    a. 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   HFrEF (heart failure with reduced ejection fraction) (HCC)    LVEF 40-45% 02/2022 [facility])   Hyperlipidemia    Hypertension    Myocardial infarction [facility]) 06/10/2002   NSTEMI (non-ST  elevated myocardial infarction) (HCC) 12/10/2021   Recurrent UTI    Stroke (HCC) 09/2020   SVT (supraventricular tachycardia) (HCC)    a. On amio.   Vascular dementia (HCC)    Vertigo    Wears dentures     Past Surgical History:  Procedure Laterality Date   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  Redo of 2V CABG, Aortic Root repair and AVR   CATARACT EXTRACTION W/PHACO Right 11/19/2021   Procedure: CATARACT EXTRACTION PHACO AND INTRAOCULAR LENS PLACEMENT (IOC) RIGHT DIABETIC;  Surgeon: Nevada Crane, MD;  Location: [facility];  Service: Ophthalmology;  Laterality: Right;  7.32 0:54.3   CATARACT EXTRACTION W/PHACO Left 12/03/2021   Procedure: CATARACT EXTRACTION PHACO AND INTRAOCULAR LENS PLACEMENT (IOC) LEFT DIABETIC;  Surgeon: Estanislado Pandy, MD;  Location: [facility];  Service: Ophthalmology;  Laterality: Left;  LEAVE FIRST CASE Diabetic 3.74 00:36.3   CORONARY ARTERY BYPASS GRAFT  2009   x4; stents to diagonal a mid right coronary lesion; drug eluting stent in the mid RCA as well as ostium of the diagonal with a cutting balloon angioplasty of the diagonal.   CORONARY STENT INTERVENTION N/A 12/14/2021   Procedure: CORONARY STENT INTERVENTION;  Surgeon: Iran Ouch, MD;  Location: [facility] INVASIVE CV  LAB;  Service: Cardiovascular;  Laterality: N/A;   CORONARY ULTRASOUND/IVUS N/A 12/14/2021   Procedure: Intravascular Ultrasound/IVUS;  Surgeon: Iran Ouch, MD;  Location: [facility] INVASIVE CV LAB;  Service: Cardiovascular;  Laterality: N/A;   JOINT REPLACEMENT     RIGHT HEART CATH AND CORONARY/GRAFT ANGIOGRAPHY N/A 12/12/2021   Procedure: RIGHT HEART CATH AND CORONARY/GRAFT ANGIOGRAPHY;  Surgeon: Yvonne Kendall, MD;  Location: [facility] INVASIVE CV LAB;  Service: Cardiovascular;  Laterality: N/A;     reports that he quit smoking about 30 years ago. His smoking use included cigarettes. He started smoking about 34  years ago. He has a 1 pack-year smoking history. He quit smokeless tobacco use about 30 years ago.  His smokeless tobacco use included chew. He reports that he does not drink alcohol and does not use drugs.  Allergies  Allergen Reactions   Altace [Ramipril] Cough   Shellfish Allergy Nausea And Vomiting   Sulfa Antibiotics Hives    Family History  Problem Relation Age of Onset   Heart disease Mother    Heart disease Father     Prior to Admission medications   Medication Sig Start Date End Date Taking? Authorizing Provider  acetaminophen (TYLENOL) 325 MG tablet Take 1-2 tablets (325-650 mg total) by mouth every 4 (four) hours as needed for mild pain. 02/25/23  Yes Setzer, Lynnell Jude, PA-C  amiodarone (PACERONE) 200 MG tablet Take 1 tablet by mouth daily. 04/17/21 06/30/23 Yes [provider]  apixaban (ELIQUIS) 5 MG TABS tablet Take 1 tablet (5 mg total) by mouth 2 (two) times daily. 02/18/23  Yes Dorcas Carrow, MD  atorvastatin (LIPITOR) 80 MG tablet Take 80 mg by mouth daily. 12/12/20  Yes [provider]  empagliflozin (JARDIANCE) 10 MG TABS tablet Take 1 tablet by mouth daily. 08/08/21  Yes [provider]  ezetimibe (ZETIA) 10 MG tablet Take 10 mg by mouth daily.   Yes [provider]  finasteride (PROSCAR) 5 MG tablet Take 5 mg by mouth daily. 09/05/22  Yes [provider]  insulin glargine (LANTUS) 100 UNIT/ML Solostar Pen Inject 10 Units into the skin daily. Patient taking differently: Inject 14 Units into the skin daily. 02/25/23  Yes Setzer, Lynnell Jude, PA-C  metoprolol succinate (TOPROL-XL) 50 MG 24 hr tablet Take 50 mg by mouth daily.   Yes [provider]  tamsulosin (FLOMAX) 0.4 MG CAPS capsule Take 1 capsule (0.4 mg total) by mouth daily. 02/26/23  Yes Setzer, Lynnell Jude, PA-C  Insulin Pen Needle (AURORA PEN NEEDLES) 31G X 6 MM MISC 1 each by Does not apply route daily at 2 PM. 02/25/23   Setzer, Lynnell Jude, PA-C  nitroGLYCERIN (NITROSTAT)  0.4 MG SL tablet Place under the tongue. Patient not taking: Reported on 06/30/2023 01/23/22   [provider]    Physical Exam: Constitutional: Moderately built and nourished. Vitals:   06/30/23 1810 06/30/23 1811 06/30/23 1812 06/30/23 2039  BP: 134/76   (!) 112/96  Pulse: (!) 112   (!) 103  Resp: (!) 22   16  Temp:  97.8 F (36.6 C)    TempSrc:  Oral    SpO2: 96%   98%  Weight:   102.1 kg    Eyes: Anicteric no pallor. ENMT: No discharge from the ears eyes nose or mouth. Neck: No mass felt.  No neck rigidity. Respiratory: No rhonchi or crepitations. Cardiovascular: S1-S2 heard. Abdomen: Soft nontender bowel sound present. Musculoskeletal: No edema. Skin: No rash. Neurologic: Alert awake oriented to person  and place.  Moving all extremities. Psychiatric: Oriented to place and place.   Labs on Admission: I have personally reviewed following labs and imaging studies  CBC: Recent Labs  Lab 06/30/23 1825  WBC 6.3  HGB 15.0  HCT 44.9  MCV 85.4  PLT 120*   Basic Metabolic Panel: Recent Labs  Lab 06/30/23 1825  NA 139  K 3.7  CL 105  CO2 23  GLUCOSE 177*  BUN 42*  CREATININE 2.86*  CALCIUM 9.4  MG 2.1   GFR: Estimated Creatinine Clearance: 28.9 mL/min (A) (by C-G formula based on SCr of 2.86 mg/dL (H)). Liver Function Tests: Recent Labs  Lab 06/30/23 1825  AST 18  ALT 19  ALKPHOS 93  BILITOT 0.8  PROT 6.4*  ALBUMIN 3.6   Recent Labs  Lab 06/30/23 1825  LIPASE 39   No results for input(s): "AMMONIA" in the last 168 hours. Coagulation Profile: No results for input(s): "INR", "PROTIME" in the last 168 hours. Cardiac Enzymes: No results for input(s): "CKTOTAL", "CKMB", "CKMBINDEX", "TROPONINI" in the last 168 hours. BNP (last 3 results) No results for input(s): "PROBNP" in the last 8760 hours. HbA1C: No results for input(s): "HGBA1C" in the last 72 hours. CBG: No results for input(s): "GLUCAP" in the last 168 hours. Lipid Profile: No  results for input(s): "CHOL", "HDL", "LDLCALC", "TRIG", "CHOLHDL", "LDLDIRECT" in the last 72 hours. Thyroid Function Tests: No results for input(s): "TSH", "T4TOTAL", "FREET4", "T3FREE", "THYROIDAB" in the last 72 hours. Anemia Panel: No results for input(s): "VITAMINB12", "FOLATE", "FERRITIN", "TIBC", "IRON", "RETICCTPCT" in the last 72 hours. Urine analysis:    Component Value Date/Time   COLORURINE YELLOW 03/24/2023 1737   APPEARANCEUR CLOUDY (A) 03/24/2023 1737   LABSPEC 1.013 03/24/2023 1737   PHURINE 5.0 03/24/2023 1737   GLUCOSEU >=500 (A) 03/24/2023 1737   HGBUR SMALL (A) 03/24/2023 1737   BILIRUBINUR NEGATIVE 03/24/2023 1737   BILIRUBINUR neg 08/10/2011 1236   KETONESUR NEGATIVE 03/24/2023 1737   PROTEINUR 100 (A) 03/24/2023 1737   UROBILINOGEN 0.2 08/10/2011 1236   UROBILINOGEN 0.2 07/27/2007 1303   NITRITE NEGATIVE 03/24/2023 1737   LEUKOCYTESUR LARGE (A) 03/24/2023 1737   Sepsis Labs: @LABRCNTIP (procalcitonin:4,lacticidven:4) )No results found for this or any previous visit (from the past 240 hours).   Radiological Exams on Admission: DG Chest 2 View Result Date: 06/30/2023 CLINICAL DATA:  Chest pain EXAM: CHEST - 2 VIEW COMPARISON:  03/24/2023 FINDINGS: Post sternotomy changes and valve prosthesis. No acute airspace disease or pleural effusion. Stable cardiomediastinal silhouette with aortic atherosclerosis. No pneumothorax IMPRESSION: No active cardiopulmonary disease. Electronically Signed   By: Jasmine Pang M.D.   On: 06/30/2023 18:56    EKG: Independently reviewed.  Junctional tachycardia.  Assessment/Plan Principal Problem:   SVT (supraventricular tachycardia) (HCC) Active Problems:   CKD (chronic kidney disease), stage IV (HCC)   S/P aortic valve replacement with bioprosthetic valve   Uncontrolled type 2 diabetes mellitus with hyperglycemia, without long-term current use of insulin (HCC)   Essential hypertension   CORONARY ATHEROSCLEROSIS, ARTERY BYPASS  GRAFT   Benign prostatic hyperplasia   History of stroke   Vascular dementia without behavioral disturbance (HCC)   Chronic HFrEF (heart failure with reduced ejection fraction) (HCC)    Exertional dyspnea and shortness of breath could be from patient's SVT versus atrial flutter patient is on amiodarone infusion.  EP cardiology to see patient in the morning.  Continue with Eliquis beta-blockers. History of chronic HFrEF last EF measured in September 2023 was 40 to  45% did receive 1 dose of Lasix.  Further doses to be decided later by cardiology. History of CAD status post CABG and stenting and also bioprosthetic aortic valve replacement.  Denies any chest pain at this time.  Troponins negative.  Will continue with Eliquis aspirin statins Zetia beta-blockers. Diabetes mellitus type 2 takes Jardiance which is on hold due to recurrent UTI.  Will keep patient on sliding scale coverage. History of DVT on Eliquis. History of stroke takes Eliquis statins. History of vascular dementia. Chronic kidney disease stage IV creatinine at baseline. BPH on tamsulosin and Proscar.  Since patient has arrhythmia will need close monitoring and more than 2 midnights stay.   DVT prophylaxis: Eliquis. Code Status: DNR confirmed with patient's daughter. Family Communication: Daughter. Disposition Plan: Cardiac telemetry. Consults called: Cardiology. Admission status: Observation.

## 2023-07-01 ENCOUNTER — Inpatient Hospital Stay (HOSPITAL_COMMUNITY): Payer: PPO

## 2023-07-01 ENCOUNTER — Institutional Professional Consult (permissible substitution): Payer: Medicare HMO | Admitting: Neurology

## 2023-07-01 DIAGNOSIS — I471 Supraventricular tachycardia, unspecified: Secondary | ICD-10-CM | POA: Diagnosis not present

## 2023-07-01 DIAGNOSIS — R0602 Shortness of breath: Secondary | ICD-10-CM

## 2023-07-01 LAB — CBC
HCT: 43.7 % (ref 39.0–52.0)
Hemoglobin: 14.8 g/dL (ref 13.0–17.0)
MCH: 28.6 pg (ref 26.0–34.0)
MCHC: 33.9 g/dL (ref 30.0–36.0)
MCV: 84.4 fL (ref 80.0–100.0)
Platelets: 113 10*3/uL — ABNORMAL LOW (ref 150–400)
RBC: 5.18 MIL/uL (ref 4.22–5.81)
RDW: 14.7 % (ref 11.5–15.5)
WBC: 8.2 10*3/uL (ref 4.0–10.5)
nRBC: 0 % (ref 0.0–0.2)

## 2023-07-01 LAB — CBG MONITORING, ED
Glucose-Capillary: 158 mg/dL — ABNORMAL HIGH (ref 70–99)
Glucose-Capillary: 186 mg/dL — ABNORMAL HIGH (ref 70–99)
Glucose-Capillary: 187 mg/dL — ABNORMAL HIGH (ref 70–99)

## 2023-07-01 LAB — COMPREHENSIVE METABOLIC PANEL
ALT: 19 U/L (ref 0–44)
AST: 30 U/L (ref 15–41)
Albumin: 3.2 g/dL — ABNORMAL LOW (ref 3.5–5.0)
Alkaline Phosphatase: 79 U/L (ref 38–126)
Anion gap: 8 (ref 5–15)
BUN: 40 mg/dL — ABNORMAL HIGH (ref 8–23)
CO2: 21 mmol/L — ABNORMAL LOW (ref 22–32)
Calcium: 8.3 mg/dL — ABNORMAL LOW (ref 8.9–10.3)
Chloride: 110 mmol/L (ref 98–111)
Creatinine, Ser: 2.98 mg/dL — ABNORMAL HIGH (ref 0.61–1.24)
GFR, Estimated: 21 mL/min — ABNORMAL LOW (ref >60)
Glucose, Bld: 159 mg/dL — ABNORMAL HIGH (ref 70–99)
Potassium: 4.9 mmol/L (ref 3.5–5.1)
Sodium: 139 mmol/L (ref 135–145)
Total Bilirubin: 0.7 mg/dL (ref 0.0–1.2)
Total Protein: 5.5 g/dL — ABNORMAL LOW (ref 6.5–8.1)

## 2023-07-01 LAB — TSH: TSH: 3.42 u[IU]/mL (ref 0.350–4.500)

## 2023-07-01 LAB — ECHOCARDIOGRAM COMPLETE
AR max vel: 2.57 cm2
AV Area VTI: 2.26 cm2
AV Area mean vel: 2.37 cm2
AV Mean grad: 8.5 mm[Hg]
AV Peak grad: 15.1 mm[Hg]
Ao pk vel: 1.94 m/s
Area-P 1/2: 4.21 cm2
Calc EF: 25.8 %
S' Lateral: 4.08 cm
Single Plane A2C EF: 26.1 %
Single Plane A4C EF: 27.6 %
Weight: 3600 [oz_av]

## 2023-07-01 LAB — GLUCOSE, CAPILLARY
Glucose-Capillary: 127 mg/dL — ABNORMAL HIGH (ref 70–99)
Glucose-Capillary: 200 mg/dL — ABNORMAL HIGH (ref 70–99)

## 2023-07-01 LAB — TROPONIN I (HIGH SENSITIVITY): Troponin I (High Sensitivity): 11 ng/L (ref <18)

## 2023-07-01 MED ORDER — ORAL CARE MOUTH RINSE: 15.0000 mL | OROMUCOSAL | Status: DC | PRN

## 2023-07-01 MED ORDER — PERFLUTREN LIPID MICROSPHERE
1.0000 mL | INTRAVENOUS | Status: AC | PRN
  Administered 2023-07-01: 2 mL via INTRAVENOUS

## 2023-07-01 NOTE — Progress Notes (Addendum)
PROGRESS NOTE    Shane Holloway  ZOX:[id number] DOB: Nov 07, 1950 DOA: 06/30/2023 PCP: Kurtis Bushman, MD    Brief Narrative:   Shane Holloway is a 73 y.o. 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  Symptoms present at rest and denies any worsening with exertion.  Daughter reports recently treated for UTI 3 times over the last 3 months but has been doing fine over the last 2-3 weeks.  Denies fever/chills, no cough.  In the ED, temperature 97.8 F, HR 112, RR 22, BP 134/76, SpO2 96% on room air.  WBC 6.3, hemoglobin 15.0, platelet count 120.  Sodium 139, potassium 3.7, chloride 105, CO2 23, glucose 177, BUN 42, creatinine 2.86.  AST 18, ALT 19, total bilirubin 0.8.  Lipase 39.  BNP 293.4.  High sensitive troponin 16>11.  Chest x-ray with no active cardiopulmonary disease process.  EKG showing junctional tachycardia, rate 123, QTc 518, no concerning dynamic changes.  Cardiology was consulted, felt patient was in SVT versus atrial flutter and was started on amiodarone drip.  Patient was given 1 dose Lasix IV 40 mg.  TRH consulted for admission for further evaluation management of SVT versus atrial flutter with RVR.  Assessment & Plan:   Arrhythmia concerning for SVT versus atrial flutter with RVR Patient presenting to the ED with shortness of breath, chest pain.  Patient is afebrile without leukocytosis.  High sensitive troponin negative x 2.  Chest x-ray with no cardiopulmonary disease process.  EKG with junctional tachycardia rate 123 with no concerning dynamic changes. -- Cardiology following; appreciate assistance -- Amiodarone drip -- Metoprolol succinate 50 mg p.o. daily -- Eliquis 5 mg PO BID -- Monitor on telemetry -- Further  recommendations per cardiology  HFmrEF TTE September [facility] at [facility] with LVEF 40-45%.  BNP slightly elevated on admission 2.93.4, chest x-ray clear.  Did receive 1 dose of IV Lasix. -- Strict I's and O's and daily weights  CAD s/p CABG/PCI/stent Patient presenting with chest pain as above, high sensitive troponin negative x 2.  EKG with no concerning dynamic changes. -- Continue statin, Eliquis  Type 2 diabetes mellitus, with hyperglycemia Hemoglobin A1c 8.8 01/25/2023. -- Semglee 14 units Travis daily -- sensitive SSI for coverage -- CBGs qAC/HS  CKD stage IV -- Cr 2.86>2.98, stable -- Avoid nephrotoxins, renal dose all medications  CVA with vascular dementia -- Delirium precautions -- Get up during the day -- Keep a familiar face to remain present throughout the day -- Keep blinds open and lights on during daylight hours -- Minimize the use of opioids/benzodiazepines -- Continue statin  Dyslipidemia -- Zetia 10 mg p.o. daily -- Atorvastatin 80 mg p.o. daily  BPH -- Finasteride 5 mg p.o. daily -- Tamsulosin 0.4 mg p.o. daily  History of DVT on anticoagulation -- Eliquis 5 mg p.o. twice daily  Weakness/fall at home Niece reports 1 fall at home recently. -- PT/OT evaluation -- Fall precautions   DVT prophylaxis:  apixaban (ELIQUIS) tablet 5 mg    Code Status: Limited: Do not attempt resuscitation (DNR) -DNR-LIMITED -Do Not Intubate/DNI  Family Communication: Updated patient's niece on telephone in patient's room  Disposition Plan:  Level of care: Progressive Status is: Inpatient Remains inpatient appropriate because: Amiodarone drip, pending further recommendations  from cardiology    Consultants:  Cardiology  Procedures:  None  Antimicrobials:  None   Subjective: Patient seen examined bedside, resting calmly.  Sitting at edge of bed in ED holding area.  Continues on amiodarone drip.  Updated patient's niece via speaker phone in patient's room.  Patient  currently denies headache, no visual changes, no chest pain, no shortness of breath, no abdominal pain, no fever, no cough/congestion, no paresthesias.  Remains on amiodarone drip with mild intermittent tachycardia.  No acute concerns overnight per nursing staff.  Objective: Vitals:   07/01/23 0330 07/01/23 0445 07/01/23 0642 07/01/23 0826  BP: 102/88 106/82    Pulse: (!) 101 98    Resp: 18 17    Temp:   97.6 F (36.4 C)   TempSrc:   Oral Oral  SpO2: 100% 98%    Weight:       No intake or output data in the 24 hours ending 07/01/23 1016 Filed Weights   06/30/23 1812  Weight: 102.1 kg    Examination:  Physical Exam: GEN: NAD, alert and oriented to person/place, elderly in appearance HEENT: NCAT, PERRL, EOMI, sclera clear, MMM PULM: CTAB w/o wheezes/crackles, normal respiratory effort CV: Tachycardic, regular rhythm w/o M/G/R GI: abd soft, NTND, NABS, no R/G/M MSK: no peripheral edema, moves all extremities independently NEURO: No focal neurological deficit PSYCH: normal mood/affect Integumentary: No concerning rashes/lesions/wounds noted on exposed skin surfaces    Data Reviewed: I have personally reviewed following labs and imaging studies  CBC: Recent Labs  Lab 06/30/23 1825 07/01/23 0334  WBC 6.3 8.2  HGB 15.0 14.8  HCT 44.9 43.7  MCV 85.4 84.4  PLT 120* 113*   Basic Metabolic Panel: Recent Labs  Lab 06/30/23 1825 07/01/23 0442  NA 139 139  K 3.7 4.9  CL 105 110  CO2 23 21*  GLUCOSE 177* 159*  BUN 42* 40*  CREATININE 2.86* 2.98*  CALCIUM 9.4 8.3*  MG 2.1  --    GFR: Estimated Creatinine Clearance: 27.7 mL/min (A) (by C-G formula based on SCr of 2.98 mg/dL (H)). Liver Function Tests: Recent Labs  Lab 06/30/23 1825 07/01/23 0442  AST 18 30  ALT 19 19  ALKPHOS 93 79  BILITOT 0.8 0.7  PROT 6.4* 5.5*  ALBUMIN 3.6 3.2*   Recent Labs  Lab 06/30/23 1825  LIPASE 39   No results for input(s): "AMMONIA" in the last 168 hours. Coagulation  Profile: No results for input(s): "INR", "PROTIME" in the last 168 hours. Cardiac Enzymes: No results for input(s): "CKTOTAL", "CKMB", "CKMBINDEX", "TROPONINI" in the last 168 hours. BNP (last 3 results) No results for input(s): "PROBNP" in the last 8760 hours. HbA1C: No results for input(s): "HGBA1C" in the last 72 hours. CBG: Recent Labs  Lab 07/01/23 0733  GLUCAP 158*   Lipid Profile: No results for input(s): "CHOL", "HDL", "LDLCALC", "TRIG", "CHOLHDL", "LDLDIRECT" in the last 72 hours. Thyroid Function Tests: Recent Labs    07/01/23 0334  TSH 3.420   Anemia Panel: No results for input(s): "VITAMINB12", "FOLATE", "FERRITIN", "TIBC", "IRON", "RETICCTPCT" in the last 72 hours. Sepsis Labs: No results for input(s): "PROCALCITON", "LATICACIDVEN" in the last 168 hours.  No results found for this or any previous visit (from the past 240 hours).       Radiology Studies: DG Chest 2 View Result Date: 06/30/2023 CLINICAL DATA:  Chest pain EXAM: CHEST - 2 VIEW COMPARISON:  03/24/2023 FINDINGS: Post sternotomy changes and valve prosthesis. No acute airspace disease or pleural  effusion. Stable cardiomediastinal silhouette with aortic atherosclerosis. No pneumothorax IMPRESSION: No active cardiopulmonary disease. Electronically Signed   By: Jasmine Pang M.D.   On: 06/30/2023 18:56        Scheduled Meds:  apixaban  5 mg Oral BID   atorvastatin  80 mg Oral Daily   ezetimibe  10 mg Oral Daily   finasteride  5 mg Oral Daily   insulin aspart  0-9 Units Subcutaneous TID WC   insulin glargine-yfgn  14 Units Subcutaneous Daily   metoprolol succinate  50 mg Oral Daily   tamsulosin  0.4 mg Oral Daily   Continuous Infusions:  amiodarone 30 mg/hr (07/01/23 0250)     LOS: 1 day    Time spent: 53 minutes spent on chart review, discussion with nursing staff, consultants, updating family and interview/physical exam; more than 50% of that time was spent in counseling and/or  coordination of care.    Alvira Philips [place], DO [facility] Available via Epic secure chat 7am-7pm After these hours, please refer to coverage provider listed on [email or web address] 07/01/2023, 10:16 AM

## 2023-07-01 NOTE — Progress Notes (Addendum)
Patient HR in low 50s, dropped as low as 48bpm. Paging cardiology. No complaints of dizziness or lightheadedness.   Continuing amio per cards

## 2023-07-01 NOTE — Care Management (Addendum)
ED RNCM reviewed chart and noted patient has had 3 ED visit 3 resulting in hospitalization in the past 6 months. Patient has a PCP at [facility]. Patient may benefit from [facility] services.  TOC to follow for transition of care planning.

## 2023-07-01 NOTE — Consult Note (Signed)
Cardiology Consultation   Patient ID: HUGH CREASMAN MRN: [id number]; DOB: [birth year]  Admit date: 06/30/2023 Date of Consult: 07/01/2023  PCP:  Kurtis Bushman, MD   [facility] Providers Cardiologist:  [facility]  Patient Profile:   BURDETTE STAPLEFORD is a 73 y.o. male with a hx of  CAD status post three-vessel CABG at [facility] in 2009, redo 2V CABG, recent PCI to SVG to diagonal in setting of NSTEMI, aortic root repair, and bioprosthetic AVR 2015 [facility]), stroke (reported as suspicious for embolic source:  Hx of stroke, MRI consistent with likely embolic source. With no known history of atrial fibrillation nor symptoms to suggest this. Reported hx of Zio patch that did not reveal any burden of atrial fibrillation, also had TEE to evaluate for PFO/ASD which was negative other than lipomatous hypertrophy of atrial septum,  maintained on Eliquis, aspirin cognitive impairment, hypertension, diabetes, hyperlipidemia, ICM, CKD (IV)  SVT reported hx of refractory SVT upon presentation to [facility]. He required multiple rounds of adenosine and ultimately amiodarone to suppress rhythm (his SVT by report did historically didbreak briefly with adenosine before recurring). Maintained on amiodarone and BB outpt.  who is being seen 07/01/2023 for the evaluation of SVT vs AFlutter at the request of Dr. Diona Browner.  History of Present Illness:   Mr. Olesh came to the ER yesterday with acute SOB/CP found in what was suspect to be an SVT. HS Trop not suggestive of MI, suspect symptoms 2/2 SVT, no CP or exertional symptoms of late reported otherwise Has been struggling with recurrent UTIs of late (3 in 3 mo, but better of late) No recent symptoms of fever, illness  Was started on amiodarone gtt in the ER (I do n ot see any adenosine given)  He reports currently feeling well, no symptoms or cardiac awareness at all, his HR settled into the 80's-90's  LABS K+ 3.7 > 4.9 BUN/Creat  42/2.86 > 2.98 Mag 2.1 BNP 293 (> given IV lasix) HS Trop 16 > 11 WBC 8.2 H/H 14/43 Plts 113 TSH 3.420  Chronically on amiodarone and Toprol   Past Medical History:  Diagnosis Date   Allergy    Aortic stenosis    27 mm bioprosthetic Edwards AVR and 28 mm ascending aortic replacement 2015 ([facility])   Arthritis    BPH (benign prostatic hyperplasia)    CAD (coronary artery disease)    CABG 2009 ([facility]) with redo 2015 [facility]) - LIMA to LAD, SVG OM1 and D1, SVG to RPDA; 12/2021 DES to SVG D1   CKD stage 3b, GFR 30-44 ml/min (HCC)    COVID-19    Diabetes mellitus    DVT of upper extremity (deep vein thrombosis) (HCC)    a. 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   HFrEF (heart failure with reduced ejection fraction) (HCC)    LVEF 40-45% 02/2022 [facility])   Hyperlipidemia    Hypertension    Myocardial infarction [facility]) 06/10/2002   NSTEMI (non-ST elevated myocardial infarction) (HCC) 12/10/2021   Recurrent UTI    Stroke (HCC) 09/2020   SVT (supraventricular tachycardia) (HCC)    a. On amio.   Vascular dementia (HCC)    Vertigo    Wears dentures     Past Surgical History:  Procedure Laterality Date   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  Redo of 2V CABG, Aortic Root repair and AVR   CATARACT EXTRACTION W/PHACO Right 11/19/2021   Procedure:  CATARACT EXTRACTION PHACO AND INTRAOCULAR LENS PLACEMENT (IOC) RIGHT DIABETIC;  Surgeon: Nevada Crane, MD;  Location: [facility];  Service: Ophthalmology;  Laterality: Right;  7.32 0:54.3   CATARACT EXTRACTION W/PHACO Left 12/03/2021   Procedure: CATARACT EXTRACTION PHACO AND INTRAOCULAR LENS PLACEMENT (IOC) LEFT DIABETIC;  Surgeon: Estanislado Pandy, MD;  Location: [facility];  Service: Ophthalmology;  Laterality: Left;  LEAVE FIRST CASE Diabetic 3.74 00:36.3   CORONARY ARTERY BYPASS GRAFT  2009   x4; stents to diagonal a mid right coronary lesion; drug eluting stent in the  mid RCA as well as ostium of the diagonal with a cutting balloon angioplasty of the diagonal.   CORONARY STENT INTERVENTION N/A 12/14/2021   Procedure: CORONARY STENT INTERVENTION;  Surgeon: Iran Ouch, MD;  Location: [facility] INVASIVE CV LAB;  Service: Cardiovascular;  Laterality: N/A;   CORONARY ULTRASOUND/IVUS N/A 12/14/2021   Procedure: Intravascular Ultrasound/IVUS;  Surgeon: Iran Ouch, MD;  Location: [facility] INVASIVE CV LAB;  Service: Cardiovascular;  Laterality: N/A;   JOINT REPLACEMENT     RIGHT HEART CATH AND CORONARY/GRAFT ANGIOGRAPHY N/A 12/12/2021   Procedure: RIGHT HEART CATH AND CORONARY/GRAFT ANGIOGRAPHY;  Surgeon: Yvonne Kendall, MD;  Location: [facility] INVASIVE CV LAB;  Service: Cardiovascular;  Laterality: N/A;     Home Medications:  Prior to Admission medications   Medication Sig Start Date End Date Taking? Authorizing Provider  acetaminophen (TYLENOL) 325 MG tablet Take 1-2 tablets (325-650 mg total) by mouth every 4 (four) hours as needed for mild pain. 02/25/23  Yes Setzer, Lynnell Jude, PA-C  amiodarone (PACERONE) 200 MG tablet Take 1 tablet by mouth daily. 04/17/21 06/30/23 Yes [provider]  apixaban (ELIQUIS) 5 MG TABS tablet Take 1 tablet (5 mg total) by mouth 2 (two) times daily. 02/18/23  Yes Dorcas Carrow, MD  atorvastatin (LIPITOR) 80 MG tablet Take 80 mg by mouth daily. 12/12/20  Yes [provider]  empagliflozin (JARDIANCE) 10 MG TABS tablet Take 1 tablet by mouth daily. 08/08/21  Yes [provider]  ezetimibe (ZETIA) 10 MG tablet Take 10 mg by mouth daily.   Yes [provider]  finasteride (PROSCAR) 5 MG tablet Take 5 mg by mouth daily. 09/05/22  Yes [provider]  insulin glargine (LANTUS) 100 UNIT/ML Solostar Pen Inject 10 Units into the skin daily. Patient taking differently: Inject 14 Units into the skin daily. 02/25/23  Yes Setzer, Lynnell Jude, PA-C  metoprolol succinate (TOPROL-XL) 50 MG 24 hr tablet Take 50 mg by mouth  daily.   Yes [provider]  tamsulosin (FLOMAX) 0.4 MG CAPS capsule Take 1 capsule (0.4 mg total) by mouth daily. 02/26/23  Yes Setzer, Lynnell Jude, PA-C  Insulin Pen Needle (AURORA PEN NEEDLES) 31G X 6 MM MISC 1 each by Does not apply route daily at 2 PM. 02/25/23   Setzer, Lynnell Jude, PA-C  nitroGLYCERIN (NITROSTAT) 0.4 MG SL tablet Place under the tongue. Patient not taking: Reported on 06/30/2023 01/23/22   [provider]    Inpatient Medications: Scheduled Meds:  apixaban  5 mg Oral BID   atorvastatin  80 mg Oral Daily   ezetimibe  10 mg Oral Daily   finasteride  5 mg Oral Daily   insulin aspart  0-9 Units Subcutaneous TID WC   insulin glargine-yfgn  14 Units Subcutaneous Daily   metoprolol succinate  50 mg Oral Daily   tamsulosin  0.4 mg Oral Daily   Continuous Infusions:  amiodarone 30 mg/hr (07/01/23 1107)  PRN Meds: acetaminophen **OR** acetaminophen, perflutren lipid microspheres (DEFINITY) IV suspension  Allergies:    Allergies  Allergen Reactions   Altace [Ramipril] Cough   Shellfish Allergy Nausea And Vomiting   Sulfa Antibiotics Hives    Social History:   Social History   Socioeconomic History   Marital status: Legally Separated    Spouse name: Not on file   Number of children: Not on file   Years of education: Not on file   Highest education level: Not on file  Occupational History   Occupation: Full time    Comment: Owns own business  Tobacco Use   Smoking status: Former    Current packs/day: 0.00    Average packs/day: 0.3 packs/day for 4.0 years (1.0 ttl pk-yrs)    Types: Cigarettes    Start date: 21    Quit date: 1995    Years since quitting: 30.0   Smokeless tobacco: Former    Types: Chew    Quit date: 1995  Vaping Use   Vaping status: Never Used  Substance and Sexual Activity   Alcohol use: No   Drug use: No   Sexual activity: Not on file  Other Topics Concern   Not on file  Social History Narrative   No regular  exercise   Social Drivers of Health   Financial Resource Strain: Low Risk  (11/22/2022)   Received from [facility]   Overall Financial Resource Strain (CARDIA)    Difficulty of Paying Living Expenses: Not hard at all  Food Insecurity: No Food Insecurity (07/01/2023)   Hunger Vital Sign    Worried About Running Out of Food in the Last Year: Never true    Ran Out of Food in the Last Year: Never true  Transportation Needs: No Transportation Needs (07/01/2023)   PRAPARE - Administrator, Civil Service (Medical): No    Lack of Transportation (Non-Medical): No  Physical Activity: Not on file  Stress: Not on file  Social Connections: Moderately Isolated (07/01/2023)   Social Connection and Isolation Panel [NHANES]    Frequency of Communication with Friends and Family: Twice a week    Frequency of Social Gatherings with Friends and Family: More than three times a week    Attends Religious Services: More than 4 times per year    Active Member of [employer] or Organizations: No    Attends Banker Meetings: Never    Marital Status: Divorced  Catering manager Violence: Not At Risk (07/01/2023)   Humiliation, Afraid, Rape, and Kick questionnaire    Fear of Current or Ex-Partner: No    Emotionally Abused: No    Physically Abused: No    Sexually Abused: No    Family History:   Family History  Problem Relation Age of Onset   Heart disease Mother    Heart disease Father      ROS:  Please see the history of present illness.  All other ROS reviewed and negative.     Physical Exam/Data:   Vitals:   07/01/23 0330 07/01/23 0445 07/01/23 0642 07/01/23 0826  BP: 102/88 106/82    Pulse: (!) 101 98    Resp: 18 17    Temp:   97.6 F (36.4 C)   TempSrc:   Oral Oral  SpO2: 100% 98%    Weight:       No intake or output data in the 24 hours ending 07/01/23 1138    06/30/2023  6:12 PM 02/23/2023    6:14 AM 02/22/2023    5:00 AM  Last 3 Weights   Weight (lbs) 225 lb 212 lb 4.9 oz 205 lb 11 oz  Weight (kg) 102.059 kg 96.3 kg 93.3 kg     Body mass index is 28.89 kg/m.  General:  Well nourished, well developed, in no acute distress HEENT: normal Neck: no JVD Vascular: No carotid bruits; Distal pulses 2+ bilaterally Cardiac:  RRR; no murmurs Lungs:  CTA c/l, no wheezing, rhonchi or rales  Abd: soft, nontender, no hepatomegaly  Ext: no edema Musculoskeletal:  No deformities, Skin: warm and dry  Neuro:  no focal abnormalities noted Psych:  Normal affect   EKG:  The EKG was personally reviewed and demonstrates:    SVT 123bpm (looks short RP, suspect AVNRT) SVT 100bpm (no discernable P waves), regular  OLD 03/24/23 SVT 101bpm (in setting of N/V 02/12/23: SR 63bpm, 1st degree AVblock   Telemetry:  Telemetry was personally reviewed and demonstrates:   Remains in narrow QRS rhythm, likely the same SVT >> slowed by amiodarone gtt  Relevant CV Studies:  Echocardiogram 02/27/2022 [facility]): Summary   1. The left ventricle is normal in size with mildly increased wall  thickness.   2. The left ventricular systolic function is mildly to moderately decreased,  LVEF is visually estimated at 40-45%.    3. There is grade I diastolic dysfunction (impaired relaxation).    4. Aortic valve replacement (27 mm bioprosthetic, implantation date:  12/27/2013).   5. Aortic valve Doppler indices are consistent with normal prosthetic valve  function.   6. The left atrium is mildly dilated in size.    7. The right ventricle is not well visualized but probably normal in size,  with low normal systolic function.     Laboratory Data:  High Sensitivity Troponin:   Recent Labs  Lab 06/30/23 1825 07/01/23 0334  TROPONINIHS 16 11     Chemistry Recent Labs  Lab 06/30/23 1825 07/01/23 0442  NA 139 139  K 3.7 4.9  CL 105 110  CO2 23 21*  GLUCOSE 177* 159*  BUN 42* 40*  CREATININE 2.86* 2.98*  CALCIUM 9.4 8.3*  MG 2.1  --    GFRNONAA 22* 21*  ANIONGAP 11 8    Recent Labs  Lab 06/30/23 1825 07/01/23 0442  PROT 6.4* 5.5*  ALBUMIN 3.6 3.2*  AST 18 30  ALT 19 19  ALKPHOS 93 79  BILITOT 0.8 0.7   Lipids No results for input(s): "CHOL", "TRIG", "HDL", "LABVLDL", "LDLCALC", "CHOLHDL" in the last 168 hours.  Hematology Recent Labs  Lab 06/30/23 1825 07/01/23 0334  WBC 6.3 8.2  RBC 5.26 5.18  HGB 15.0 14.8  HCT 44.9 43.7  MCV 85.4 84.4  MCH 28.5 28.6  MCHC 33.4 33.9  RDW 14.4 14.7  PLT 120* 113*   Thyroid  Recent Labs  Lab 07/01/23 0334  TSH 3.420    BNP Recent Labs  Lab 06/30/23 1825  BNP 293.4*    DDimer No results for input(s): "DDIMER" in the last 168 hours.   Radiology/Studies:  DG Chest 2 View Result Date: 06/30/2023 CLINICAL DATA:  Chest pain EXAM: CHEST - 2 VIEW COMPARISON:  03/24/2023 FINDINGS: Post sternotomy changes and valve prosthesis. No acute airspace disease or pleural effusion. Stable cardiomediastinal silhouette with aortic atherosclerosis. No pneumothorax IMPRESSION: No active cardiopulmonary disease. Electronically Signed   By: Jasmine Pang M.D.   On: 06/30/2023 18:56     Assessment  and Plan:   SVT Has known hx of SVT that has been described as refractory > chronically on amiodarone He is feeling well Symptoms by cards consult last night suspect 2/2 SVT with neg Trops No adenosine given  Dr. Lalla Brothers has been bedside Rate is much better on amiodarone gtt Symptoms are resolved Consider adenosine if amiodarone does not correct his rhythm by later this afternoon.  CAD Multivessel CAD No ACS Continue home meds  VHD Hx of bioprosthetic AVR Echo ordered/pending     Risk Assessment/Risk Scores:   For questions or updates, please contact [facility] Please consult [email or web address] for contact info under    Signed, Sheilah Pigeon, PA-C  07/01/2023 11:38 AM

## 2023-07-01 NOTE — Plan of Care (Signed)
  Problem: Coping: Goal: Ability to adjust to condition or change in health will improve Outcome: Progressing   Problem: Fluid Volume: Goal: Ability to maintain a balanced intake and output will improve Outcome: Progressing

## 2023-07-02 DIAGNOSIS — I471 Supraventricular tachycardia, unspecified: Secondary | ICD-10-CM | POA: Diagnosis not present

## 2023-07-02 LAB — BASIC METABOLIC PANEL
Anion gap: 10 (ref 5–15)
BUN: 44 mg/dL — ABNORMAL HIGH (ref 8–23)
CO2: 24 mmol/L (ref 22–32)
Calcium: 9.2 mg/dL (ref 8.9–10.3)
Chloride: 102 mmol/L (ref 98–111)
Creatinine, Ser: 3.1 mg/dL — ABNORMAL HIGH (ref 0.61–1.24)
GFR, Estimated: 20 mL/min — ABNORMAL LOW (ref >60)
Glucose, Bld: 179 mg/dL — ABNORMAL HIGH (ref 70–99)
Potassium: 3.8 mmol/L (ref 3.5–5.1)
Sodium: 136 mmol/L (ref 135–145)

## 2023-07-02 LAB — MAGNESIUM: Magnesium: 2 mg/dL (ref 1.7–2.4)

## 2023-07-02 LAB — GLUCOSE, CAPILLARY: Glucose-Capillary: 147 mg/dL — ABNORMAL HIGH (ref 70–99)

## 2023-07-02 MED ORDER — AMIODARONE HCL 200 MG PO TABS
200.0000 mg | ORAL_TABLET | Freq: Every day | ORAL | Status: DC
  Administered 2023-07-02: 200 mg via ORAL
  Filled 2023-07-02: qty 1

## 2023-07-02 MED ORDER — POTASSIUM CHLORIDE CRYS ER 20 MEQ PO TBCR
20.0000 meq | EXTENDED_RELEASE_TABLET | Freq: Once | ORAL | Status: AC
  Administered 2023-07-02: 20 meq via ORAL
  Filled 2023-07-02: qty 1

## 2023-07-02 NOTE — Progress Notes (Signed)
Rounding Note    Patient Name: Shane Holloway Date of Encounter: 07/02/2023  [facility] Cardiologist: [facility] team  Subjective   Feels very well  Inpatient Medications    Scheduled Meds:  apixaban  5 mg Oral BID   atorvastatin  80 mg Oral Daily   ezetimibe  10 mg Oral Daily   finasteride  5 mg Oral Daily   insulin aspart  0-9 Units Subcutaneous TID WC   insulin glargine-yfgn  14 Units Subcutaneous Daily   tamsulosin  0.4 mg Oral Daily   Continuous Infusions:  amiodarone 30 mg/hr (07/02/23 0316)   PRN Meds: acetaminophen **OR** acetaminophen, mouth rinse   Vital Signs    Vitals:   07/01/23 2049 07/01/23 2300 07/02/23 0432 07/02/23 0808  BP: 124/81 127/82 138/68 (!) 154/93  Pulse: (!) 52 (!) 53 (!) 54 (!) 57  Resp: 19 16 18 14   Temp: 98 F (36.7 C) 98.1 F (36.7 C) 97.6 F (36.4 C) 98 F (36.7 C)  TempSrc: Oral Oral Oral Oral  SpO2: 99% 97% 97% 96%  Weight:   95.6 kg   Height:        Intake/Output Summary (Last 24 hours) at 07/02/2023 0841 Last data filed at 07/02/2023 0825 Gross per 24 hour  Intake 1122.84 ml  Output 1475 ml  Net -352.16 ml      07/02/2023    4:32 AM 07/01/2023    5:00 PM 06/30/2023    6:12 PM  Last 3 Weights  Weight (lbs) 210 lb 12.2 oz 210 lb 1.6 oz 225 lb  Weight (kg) 95.6 kg 95.3 kg 102.059 kg      Telemetry    SB 50's  - Personally Reviewed  ECG    No new EKGs - Personally Reviewed  Physical Exam    GEN: No acute distress.   Neck: No JVD Cardiac: RRR, bradycardic, soft SM, no rubs, or gallops.  Respiratory: Clear to auscultation bilaterally. GI: Soft, nontender non-distended  MS: No edema; No deformity. Neuro:  Nonfocal  Psych: Normal affect   Labs    High Sensitivity Troponin:   Recent Labs  Lab 06/30/23 1825 07/01/23 0334  TROPONINIHS 16 11     Chemistry Recent Labs  Lab 06/30/23 1825 07/01/23 0442 07/02/23 0242  NA 139 139 136  K 3.7 4.9 3.8  CL 105 110 102  CO2 23 21* 24  GLUCOSE  177* 159* 179*  BUN 42* 40* 44*  CREATININE 2.86* 2.98* 3.10*  CALCIUM 9.4 8.3* 9.2  MG 2.1  --  2.0  PROT 6.4* 5.5*  --   ALBUMIN 3.6 3.2*  --   AST 18 30  --   ALT 19 19  --   ALKPHOS 93 79  --   BILITOT 0.8 0.7  --   GFRNONAA 22* 21* 20*  ANIONGAP 11 8 10     Lipids No results for input(s): "CHOL", "TRIG", "HDL", "LABVLDL", "LDLCALC", "CHOLHDL" in the last 168 hours.  Hematology Recent Labs  Lab 06/30/23 1825 07/01/23 0334  WBC 6.3 8.2  RBC 5.26 5.18  HGB 15.0 14.8  HCT 44.9 43.7  MCV 85.4 84.4  MCH 28.5 28.6  MCHC 33.4 33.9  RDW 14.4 14.7  PLT 120* 113*   Thyroid  Recent Labs  Lab 07/01/23 0334  TSH 3.420    BNP Recent Labs  Lab 06/30/23 1825  BNP 293.4*    DDimer No results for input(s): "DDIMER" in the last 168 hours.   Radiology  Cardiac Studies   07/01/23: TTE 1. Left ventricular ejection fraction, by estimation, is 40 to 45%. The  left ventricle has mildly decreased function. The left ventricle  demonstrates global hypokinesis. There is moderate concentric left  ventricular hypertrophy. Indeterminate diastolic  filling due to E-A fusion. There is disproportionately severe hypokinesis  of the left ventricular, basal inferoseptal wall and basal inferior wall.   2. Right ventricular systolic function is mildly reduced. The right  ventricular size is normal. Tricuspid regurgitation signal is inadequate  for assessing PA pressure.   3. Left atrial size was mildly dilated.   4. The mitral valve is normal in structure. No evidence of mitral valve  regurgitation. No evidence of mitral stenosis.   5. The aortic valve has been repaired/replaced. Aortic valve  regurgitation is trivial. There is a unknown Magna bioprosthetic valve  present in the aortic position. Procedure Date: 02/22/2021. Echo findings  are consistent with normal structure and  function of the aortic valve prosthesis. Aortic valve mean gradient  measures 8.5 mmHg. Aortic valve Vmax  measures 1.94 m/s. Aortic valve  acceleration time measures 85 msec.   6. Aortic dilatation noted. There is moderate dilatation of the aortic  root, measuring 49 mm.   Comparison(s): Left ventricular systolic function was overestimated on the  previous report. LV function similar to 2023 study.    Echocardiogram 02/27/2022 [facility]): Summary   1. The left ventricle is normal in size with mildly increased wall  thickness.   2. The left ventricular systolic function is mildly to moderately decreased,  LVEF is visually estimated at 40-45%.    3. There is grade I diastolic dysfunction (impaired relaxation).    4. Aortic valve replacement (27 mm bioprosthetic, implantation date:  12/27/2013).   5. Aortic valve Doppler indices are consistent with normal prosthetic valve  function.   6. The left atrium is mildly dilated in size.    7. The right ventricle is not well visualized but probably normal in size,  with low normal systolic function.   Patient Profile     73 y.o. male w/PMHx of CAD status post three-vessel CABG at [facility] in 2009, redo 2V CABG, recent PCI to SVG to diagonal in setting of NSTEMI, aortic root repair, and bioprosthetic AVR 2015 [facility]), stroke (reported as suspicious for embolic source:  Hx of stroke, MRI consistent with likely embolic source. With no known history of atrial fibrillation nor symptoms to suggest this. Reported hx of Zio patch that did not reveal any burden of atrial fibrillation, also had TEE to evaluate for PFO/ASD which was negative other than lipomatous hypertrophy of atrial septum,  maintained on Eliquis, aspirin cognitive impairment, hypertension, diabetes, hyperlipidemia, ICM, CKD (IV)   SVT reported hx of refractory SVT upon presentation to [facility]. He required multiple rounds of adenosine and ultimately amiodarone to suppress rhythm (his SVT by report did historically didbreak briefly with adenosine before recurring). Maintained on amiodarone and BB  outpt.  ADMITTED C/o CP, SOB >> SVT    Assessment & Plan    SVT Has known hx of SVT that has been described as refractory > chronically on amiodarone He has converted back to SR (yesterday) Will transition to his home dose amiodarone today   Dr. Lalla Brothers has been bedside Rate is much better on amiodarone gtt Symptoms are resolved Consider adenosine if amiodarone does not correct his rhythm by later this afternoon.    2. CAD Multivessel CAD No ACS w/neg Trops here No CP/SOB outside  of his SVT + WMA on his echo that appears new from his last His renal function precludes cath With no angina no w/u at this time >> f/u with his primary cardiology team outpt Continue home meds   3. Sinus bradycardia No hx of syncope Rates 50's with his home toprol yesterday and amio gtt Would continue his usual toprol dose at discharge    4. VHD Hx of bioprosthetic AVR Normal function on his echo  Dr. Lalla Brothers has seen the patient this morning OK to discharge from an EP perspective He follows with his [facility] team closely, had an appt today with his cardiologist Advised he call and reschedule, to be seen in a 2 weeks or so to follow up  Further management as per IM team EP will sign off though remain available, please recall if needed   For questions or updates, please contact [facility] Please consult [email or web address] for contact info under        Signed, Sheilah Pigeon, PA-C  07/02/2023, 8:41 AM

## 2023-07-02 NOTE — TOC Initial Note (Signed)
Transition of Care [facility]) - Initial/Assessment Note    Patient Details  Name: Shane Holloway MRN: [id number] Date of Birth: 1950/09/24  Transition of Care [facility]) CM/SW Contact:    Leone Haven, RN Phone Number: 07/02/2023, 10:18 AM  Clinical Narrative:                 From home with friend, has PCP and insurance on file, states has an aide at home that stays with him and assist him. He has walker and a cane at home.  States his aide will transport them home at [employer] and family is support system, states gets medications from [employer].   Pta self ambulatory  with cane or walker.   Expected Discharge Plan: Home/Self Care Barriers to Discharge: No Barriers Identified   Patient Goals and CMS Choice Patient states their goals for this hospitalization and ongoing recovery are:: return home   Choice offered to / list presented to : NA      Expected Discharge Plan and Services In-house Referral: NA Discharge Planning Services: CM Consult Post Acute Care Choice: NA Living arrangements for the past 2 months: Single Family Home Expected Discharge Date: 07/02/23               DME Arranged: N/A DME Agency: NA       HH Arranged: NA          Prior Living Arrangements/Services Living arrangements for the past 2 months: Single Family Home Lives with:: Friends Patient language and need for interpreter reviewed:: Yes Do you feel safe going back to the place where you live?: Yes      Need for Family Participation in Patient Care: No (Comment) Care giver support system in place?: Yes (comment) Current home services: DME (cane and a walker) Criminal Activity/Legal Involvement Pertinent to Current Situation/Hospitalization: No - Comment as needed  Activities of Daily Living   ADL Screening (condition at time of admission) Independently performs ADLs?: Yes (appropriate for developmental age) Is the patient deaf or have difficulty hearing?: No Does the patient have  difficulty seeing, even when wearing glasses/contacts?: No Does the patient have difficulty concentrating, remembering, or making decisions?: No  Permission Sought/Granted Permission sought to share information with : Case Manager Permission granted to share information with : Yes, Verbal Permission Granted              Emotional Assessment Appearance:: Appears stated age Attitude/Demeanor/Rapport: Engaged Affect (typically observed): Appropriate Orientation: : Oriented to Self, Oriented to Place, Oriented to  Time, Oriented to Situation Alcohol / Substance Use: Not Applicable Psych Involvement: No (comment)  Admission diagnosis:  SVT (supraventricular tachycardia) (HCC) [I47.10] Chest pain, unspecified type [R07.9] Patient Active Problem List   Diagnosis Date Noted   Orthostatic hypotension 02/15/2023   SBO (small bowel obstruction) (HCC) 02/13/2023   History of DVT (deep vein thrombosis) 02/13/2023   Hypokalemia 02/13/2023   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 (HCC) 01/24/2023   Chronic HFrEF (heart failure with reduced ejection fraction) (HCC) 02/28/2022   B12 deficiency anemia 12/19/2021   Thrombocytopenia (HCC) 12/16/2021   Obesity (BMI 30-39.9) 12/11/2021   CKD (chronic kidney disease), stage IV (HCC) 12/10/2021   SVT (supraventricular tachycardia) (HCC) 03/20/2021   S/P aortic valve replacement with bioprosthetic valve 02/22/2021   Vascular dementia without behavioral disturbance (HCC) 09/13/2020   History of stroke 02/22/2014   Benign prostatic hyperplasia 04/16/2012   Uncontrolled type 2 diabetes mellitus with hyperglycemia, without long-term current use of  insulin (HCC) 06/27/2009   CORONARY ATHEROSCLEROSIS, ARTERY BYPASS GRAFT 06/27/2009   Essential hypertension 11/24/2008   PCP:  Kurtis Bushman, MD Pharmacy:   [facility] [address] ([place]), [place] - [address] [place]) [place]  [address] Phone: [phone] Fax: [phone]  [facility] [place], [place] - [address] [place] [address] Phone: [phone] Fax: [phone]     Social Drivers of Health (SDOH) Social History: SDOH Screenings   Food Insecurity: No Food Insecurity (07/01/2023)  Housing: Low Risk  (07/01/2023)  Transportation Needs: No Transportation Needs (07/01/2023)  Utilities: Not At Risk (07/01/2023)  Depression (PHQ2-9): High Risk (05/20/2022)  Financial Resource Strain: Low Risk  (11/22/2022)   Received from [facility], [facility]  Social Connections: Moderately Isolated (07/01/2023)  Tobacco Use: Medium Risk (06/30/2023)  Health Literacy: High Risk (03/28/2021)   Received from [facility], [facility]   SDOH Interventions:     Readmission Risk Interventions     No data to display

## 2023-07-02 NOTE — Evaluation (Addendum)
Occupational Therapy Evaluation Patient Details Name: Shane Holloway MRN: [id number] DOB: 1951-03-20 Today's Date: 07/02/2023   History of Present Illness RISHARD GOTTSCH is a 73 y.o. male with past medical history significant for HFmeEF (40-45% (TT[facility] 02/2022), CAD s/p CABG/PCI and stent, DM2, CKD stage IV, CVA with vascular dementia, history of DVT on Eliquis, recent recurrent UTI who presented to [facility] ED on 1/20 from home with shortness of breath, chest pain, nausea.  Symptoms present at rest and denies any worsening with exertion.   Clinical Impression   PTA, pt reports he was independent with ADL and had assistance from caregiver and nephew's friend (who lives with pt) for IADL. Pt currently reports feeling at baseline. He demonstrated ability to complete ADL and functional mobility at modified independent level and ambulated without AD. Patient evaluated by Occupational Therapy with no further acute OT needs identified. All education has been completed and the patient has no further questions. See below for any follow-up Occupational Therapy or equipment needs. OT to sign off. Thank you for referral.     HR up to 94bpm with exertion, HR 58bpm at rest     If plan is discharge home, recommend the following: Assist for transportation    Functional Status Assessment  Patient has not had a recent decline in their functional status  Equipment Recommendations  None recommended by OT    Recommendations for Other Services       Precautions / Restrictions Precautions Precautions: Fall Restrictions Weight Bearing Restrictions Per Provider Order: No      Mobility Bed Mobility Overal bed mobility: Independent                  Transfers Overall transfer level: Modified independent Equipment used: None               General transfer comment: intermittent times to touch furniture, did not appear to rely on support but more for proprioceptive cues. no loss of balance  noted. pt reports feeling he is at baseline. ambulated in hallway ~177feet without AD      Balance Overall balance assessment: Modified Independent                                         ADL either performed or assessed with clinical judgement   ADL Overall ADL's : Modified independent                                       General ADL Comments: pt with guarded mobility at times but demonstrates ability to complete toileting, dressing, grooming while standing at sink level at modified independent.     Vision Baseline Vision/History: 1 Wears glasses Ability to See in Adequate Light: 0 Adequate Patient Visual Report: No change from baseline Vision Assessment?: No apparent visual deficits Additional Comments: reading glasses only     Perception         Praxis         Pertinent Vitals/Pain Pain Assessment Pain Assessment: No/denies pain     Extremity/Trunk Assessment Upper Extremity Assessment Upper Extremity Assessment: Overall WFL for tasks assessed   Lower Extremity Assessment Lower Extremity Assessment: Overall WFL for tasks assessed   Cervical / Trunk Assessment Cervical / Trunk Assessment: Normal   Communication Communication Communication: No apparent difficulties  Cognition Arousal: Alert Behavior During Therapy: WFL for tasks assessed/performed Overall Cognitive Status: History of cognitive impairments - at baseline                                 General Comments: pt stated year was 2023, reports he does not keep up with the date. no family to report pt's baseline.     General Comments  HR up to 94bpm with exertion, HR 58bpm at rest    Exercises     Shoulder Instructions      Home Living Family/patient expects to be discharged to:: Private residence Living Arrangements: Alone Available Help at Discharge: Family;Friend(s);Available 24 hours/day Type of Home: Mobile home (double wide) Home Access:  Ramped entrance     Home Layout: One level     Bathroom Shower/Tub: Producer, television/film/video: Standard Bathroom Accessibility: Yes How Accessible: Accessible via walker Home Equipment: Rolling Walker (2 wheels);Cane - single point;Wheelchair - manual;Shower seat   Additional Comments: pt's nephew's friend stays with him and assists with medication, has a caregiver 3x/week, 3hrs at a time.      Prior Functioning/Environment Prior Level of Function : Needs assist             Mobility Comments: walking with cane/RW inside and outside the home. ADLs Comments: caretaker assists with IADL, would sometimes cook own meals, nephew's friend assists wtih medication. has assistance for transportation.        OT Problem List: Impaired balance (sitting and/or standing)      OT Treatment/Interventions:      OT Goals(Current goals can be found in the care plan section) Acute Rehab OT Goals Patient Stated Goal: to go home today OT Goal Formulation: With patient Time For Goal Achievement: 07/16/23 Potential to Achieve Goals: Good  OT Frequency:      Co-evaluation              AM-PAC OT "6 Clicks" Daily Activity     Outcome Measure Help from another person eating meals?: None Help from another person taking care of personal grooming?: None Help from another person toileting, which includes using toliet, bedpan, or urinal?: None Help from another person bathing (including washing, rinsing, drying)?: None Help from another person to put on and taking off regular upper body clothing?: None Help from another person to put on and taking off regular lower body clothing?: None 6 Click Score: 24   End of Session Equipment Utilized During Treatment: Gait belt Nurse Communication: Mobility status  Activity Tolerance: Patient tolerated treatment well Patient left: in bed;with call bell/phone within reach;with bed alarm set  OT Visit Diagnosis: Other abnormalities of gait  and mobility (R26.89)                Time: 1610-9604 OT Time Calculation (min): 12 min Charges:  OT General Charges $OT Visit: 1 Visit OT Evaluation $OT Eval Low Complexity: 1 Low  Brantleigh Mifflin OTR/L Acute Rehabilitation Services Office: [phone]   Providence Crosby 07/02/2023, 9:11 AM

## 2023-07-02 NOTE — TOC Transition Note (Signed)
Transition of Care [facility]) - Discharge Note   Patient Details  Name: Shane Holloway MRN: [id number] Date of Birth: 09/15/1950  [facility] CM/SW Contact:  Leone Haven, RN Phone Number: 07/02/2023, 10:19 AM   Clinical Narrative:    For dc today, has no needs. Has transport.   Final next level of care: Home/Self Care Barriers to Discharge: No Barriers Identified   Patient Goals and CMS Choice Patient states their goals for this hospitalization and ongoing recovery are:: return home   Choice offered to / list presented to : NA      Discharge Placement                       Discharge Plan and Services Additional resources added to the After Visit Summary for   In-house Referral: NA Discharge Planning Services: CM Consult Post Acute Care Choice: NA          DME Arranged: N/A DME Agency: NA       HH Arranged: NA          Social Drivers of Health (SDOH) Interventions SDOH Screenings   Food Insecurity: No Food Insecurity (07/01/2023)  Housing: Low Risk  (07/01/2023)  Transportation Needs: No Transportation Needs (07/01/2023)  Utilities: Not At Risk (07/01/2023)  Depression (PHQ2-9): High Risk (05/20/2022)  Financial Resource Strain: Low Risk  (11/22/2022)   Received from [facility], [facility]  Social Connections: Moderately Isolated (07/01/2023)  Tobacco Use: Medium Risk (06/30/2023)  Health Literacy: High Risk (03/28/2021)   Received from [facility]     Readmission Risk Interventions     No data to display

## 2023-07-02 NOTE — Progress Notes (Signed)
PT Cancellation Note  Patient Details Name: KRRISH ROBLYER MRN: [id number] DOB: 12-Sep-1950   Cancelled Treatment:    Reason Eval/Treat Not Completed: PT screened, no needs identified, will sign off (Per OT, pt is ambulating independently without AD and reports he is at baseline. Will not evaluate.)  Cheri Guppy, PT, DPT Acute Rehabilitation Services Office: [phone] Secure Chat Preferred   Richardson Chiquito 07/02/2023, 9:10 AM

## 2023-07-02 NOTE — Plan of Care (Signed)

## 2023-07-02 NOTE — Discharge Summary (Signed)
Physician Discharge Summary  Shane Holloway XLK:[id number] DOB: [birth year] DOA: 06/30/2023  PCP: Kurtis Bushman, MD  Admit date: 06/30/2023 Discharge date: 07/02/2023  Admitted From: Home Disposition: Home  Recommendations for Outpatient Follow-up:  Follow up with PCP in 1-2 weeks Follow with cardiology in 2-3 weeks  Home Health: No needs identified by therapy while inpatient Equipment/Devices: None  Discharge Condition: Stable CODE STATUS: DNR Diet recommendation: Heart healthy/consistent carbohydrate diet  History of present illness:  Shane Holloway is a 73 y.o. 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  Symptoms present at rest and denies any worsening with exertion.  Daughter reports recently treated for UTI 3 times over the last 3 months but has been doing fine over the last 2-3 weeks.  Denies fever/chills, no cough.   In the ED, temperature 97.8 F, HR 112, RR 22, BP 134/76, SpO2 96% on room air.  WBC 6.3, hemoglobin 15.0, platelet count 120.  Sodium 139, potassium 3.7, chloride 105, CO2 23, glucose 177, BUN 42, creatinine 2.86.  AST 18, ALT 19, total bilirubin 0.8.  Lipase 39.  BNP 293.4.  High sensitive troponin 16>11.  Chest x-ray with no active cardiopulmonary disease process.  EKG showing junctional tachycardia, rate 123, QTc 518, no concerning dynamic changes.  Cardiology was consulted, felt patient was in SVT versus atrial flutter and was started on amiodarone drip.  Patient was given 1 dose Lasix IV 40 mg.  TRH consulted for admission for further evaluation management of SVT versus atrial flutter with RVR.  Hospital course:  Arrhythmia concerning for slow AVNRT vs junctional tachycardia Patient presenting to the ED with  shortness of breath, chest pain.  Patient is afebrile without leukocytosis.  High sensitive troponin negative x 2.  Chest x-ray with no cardiopulmonary disease process.  EKG with junctional tachycardia rate 123 with no concerning dynamic changes.  Radiology/electrophysiology was consulted and followed during hospital course.  Patient was started on amiodarone drip and converted back to sinus rhythm.  Amiodarone was transitioned back to his normal home dose of 200 mg p.o. daily.  Continue metoprolol succinate 50 mg p.o. daily.  Eliquis 5 mg p.o. twice daily.  Outpatient follow-up with his cardiologist at [facility] in 2-3 weeks.   HFmrEF TTE September 2023 at [facility] with LVEF 40-45%.  BNP slightly elevated on admission 293.4, chest x-ray clear.  Received 1 dose of IV Lasix on admission.  Continue Jardiance, metoprolol succinate.  Outpatient follow-up with cardiology.  CAD s/p CABG/PCI/stent Patient presenting with chest pain as above, high sensitive troponin negative x 2.  EKG with no concerning dynamic changes. Continue statin, Eliquis   Type 2 diabetes mellitus, with hyperglycemia Hemoglobin A1c 8.8 01/25/2023.  Continue home Lantus 14 units subcutaneously daily, Jardiance.  Patient follow-up with PCP.   CKD stage IV Creatinine 3.10 at time of discharge, stable.  Recommend repeat BMP 1 week.   CVA with vascular dementia Continue statin.  No needs identified by therapy while inpatient.  Supportive care.   Dyslipidemia Zetia 10 mg p.o. daily, Atorvastatin 80 mg p.o. daily   BPH Finasteride 5 mg p.o. daily, Tamsulosin 0.4 mg p.o. daily   History of DVT on anticoagulation Eliquis 5 mg p.o. twice daily   Weakness/fall at home Niece reports  1 fall at home recently.  Seen by PT and OT during hospitalization with no needs identified.   Discharge Diagnoses:  Principal Problem:   SVT (supraventricular tachycardia) (HCC) Active Problems:   CKD (chronic kidney disease), stage IV (HCC)   S/P aortic valve  replacement with bioprosthetic valve   Uncontrolled type 2 diabetes mellitus with hyperglycemia, without long-term current use of insulin (HCC)   Essential hypertension   CORONARY ATHEROSCLEROSIS, ARTERY BYPASS GRAFT   Benign prostatic hyperplasia   History of stroke   Vascular dementia without behavioral disturbance (HCC)   Chronic HFrEF (heart failure with reduced ejection fraction) [facility]    Discharge Instructions  Discharge Instructions     Call MD for:  difficulty breathing, headache or visual disturbances   Complete by: As directed    Call MD for:  extreme fatigue   Complete by: As directed    Call MD for:  persistant dizziness or light-headedness   Complete by: As directed    Call MD for:  persistant nausea and vomiting   Complete by: As directed    Call MD for:  severe uncontrolled pain   Complete by: As directed    Call MD for:  temperature >100.4   Complete by: As directed    Diet - low sodium heart healthy   Complete by: As directed    Increase activity slowly   Complete by: As directed       Allergies as of 07/02/2023       Reactions   Altace [ramipril] Cough   Shellfish Allergy Nausea And Vomiting   Sulfa Antibiotics Hives        Medication List     TAKE these medications    acetaminophen 325 MG tablet Commonly known as: TYLENOL Take 1-2 tablets (325-650 mg total) by mouth every 4 (four) hours as needed for mild pain.   amiodarone 200 MG tablet Commonly known as: PACERONE Take 1 tablet by mouth daily.   apixaban 5 MG Tabs tablet Commonly known as: ELIQUIS Take 1 tablet (5 mg total) by mouth 2 (two) times daily.   atorvastatin 80 MG tablet Commonly known as: LIPITOR Take 80 mg by mouth daily.   Aurora Pen Needles 31G X 6 MM Misc Generic drug: Insulin Pen Needle 1 each by Does not apply route daily at 2 PM.   empagliflozin 10 MG Tabs tablet Commonly known as: JARDIANCE Take 1 tablet by mouth daily.   ezetimibe 10 MG tablet Commonly  known as: ZETIA Take 10 mg by mouth daily.   finasteride 5 MG tablet Commonly known as: PROSCAR Take 5 mg by mouth daily.   insulin glargine 100 UNIT/ML Solostar Pen Commonly known as: LANTUS Inject 10 Units into the skin daily. What changed: how much to take   metoprolol succinate 50 MG 24 hr tablet Commonly known as: TOPROL-XL Take 50 mg by mouth daily.   nitroGLYCERIN 0.4 MG SL tablet Commonly known as: NITROSTAT Place under the tongue.   tamsulosin 0.4 MG Caps capsule Commonly known as: FLOMAX Take 1 capsule (0.4 mg total) by mouth daily.        Follow-up Information     Gladman, Cheral Marker, MD. Schedule an appointment as soon as possible for a visit in 1 week(s).   Specialty: Internal Medicine Contact information: [address] [place] [address] [place] [address] [phone]         Christain Sacramento, MD. Schedule an appointment as soon as possible for a visit.  Specialty: Internal Medicine Contact information: [address] [place] [address] [phone]                Allergies  Allergen Reactions   Altace [Ramipril] Cough   Shellfish Allergy Nausea And Vomiting   Sulfa Antibiotics Hives    Consultations: Cardiology/electrophysiology   Procedures/Studies: ECHOCARDIOGRAM COMPLETE Result Date: 07/01/2023    ECHOCARDIOGRAM REPORT   Patient Name:   EARMON DUECK Date of Exam: 07/01/2023 Medical Rec #:  [id number]         Height:       74.0 in Accession #:    [id number]        Weight:       225.0 lb Date of Birth:  02/10/1951          BSA:          2.285 m Patient Age:    72 years          BP:           106/82 mmHg Patient Gender: M                 HR:           96 bpm. Exam Location:  Inpatient Procedure: 2D Echo, Cardiac Doppler and Color Doppler Indications:    R06.02 SOB  History:        Patient has prior history of Echocardiogram examinations, most                 recent 12/11/2021. CHF, CAD and Previous Myocardial Infarction,                  Abnormal ECG and Prior CABG, Stroke, Arrythmias:SVT,                 Signs/Symptoms:Hypotension, Altered Mental Status, Dyspnea and                 Shortness of Breath; Risk Factors:Diabetes.                 Aortic Valve: unknown Magna bioprosthetic valve is present in                 the aortic position. Procedure Date: 02/22/2021.  Sonographer:    Sheralyn Boatman RDCS Referring Phys: [phone] Meryle Ready [facility]  Sonographer Comments: Technically difficult study due to poor echo windows. IMPRESSIONS  1. Left ventricular ejection fraction, by estimation, is 40 to 45%. The left ventricle has mildly decreased function. The left ventricle demonstrates global hypokinesis. There is moderate concentric left ventricular hypertrophy. Indeterminate diastolic filling due to E-A fusion. There is disproportionately severe hypokinesis of the left ventricular, basal inferoseptal wall and basal inferior wall.  2. Right ventricular systolic function is mildly reduced. The right ventricular size is normal. Tricuspid regurgitation signal is inadequate for assessing PA pressure.  3. Left atrial size was mildly dilated.  4. The mitral valve is normal in structure. No evidence of mitral valve regurgitation. No evidence of mitral stenosis.  5. The aortic valve has been repaired/replaced. Aortic valve regurgitation is trivial. There is a unknown Magna bioprosthetic valve present in the aortic position. Procedure Date: 02/22/2021. Echo findings are consistent with normal structure and function of the aortic valve prosthesis. Aortic valve mean gradient measures 8.5 mmHg. Aortic valve Vmax measures 1.94 m/s. Aortic valve acceleration time measures 85 msec.  6. Aortic dilatation noted. There is moderate dilatation of the aortic root, measuring 49 mm. Comparison(s): Left ventricular systolic function  was overestimated on the previous report. LV function similar to 2023 study. FINDINGS  Left Ventricle: Left ventricular ejection fraction, by  estimation, is 40 to 45%. The left ventricle has mildly decreased function. The left ventricle demonstrates global hypokinesis. Disproportionately Severe hypokinesis of the left ventricular, basal inferoseptal wall and inferior wall. Definity contrast agent was given IV to delineate the left ventricular endocardial borders. The left ventricular internal cavity size was normal in size. There is moderate concentric left ventricular hypertrophy. Abnormal (paradoxical) septal motion consistent with post-operative status and abnormal (paradoxical) septal motion, consistent with left bundle branch block. Indeterminate diastolic filling due to E-A fusion. Right Ventricle: The right ventricular size is normal. Right vetricular wall thickness was not well visualized. Right ventricular systolic function is mildly reduced. Tricuspid regurgitation signal is inadequate for assessing PA pressure. Left Atrium: Left atrial size was mildly dilated. Right Atrium: Right atrial size was normal in size. Pericardium: There is no evidence of pericardial effusion. Mitral Valve: The mitral valve is normal in structure. Mild mitral annular calcification. No evidence of mitral valve regurgitation. No evidence of mitral valve stenosis. Tricuspid Valve: The tricuspid valve is grossly normal. Tricuspid valve regurgitation is not demonstrated. No evidence of tricuspid stenosis. Aortic Valve: The aortic valve has been repaired/replaced. Aortic valve regurgitation is trivial. Aortic valve mean gradient measures 8.5 mmHg. Aortic valve peak gradient measures 15.1 mmHg. Aortic valve area, by VTI measures 2.26 cm. There is a unknown  Magna bioprosthetic valve present in the aortic position. Procedure Date: 02/22/2021. Echo findings are consistent with normal structure and function of the aortic valve prosthesis. Pulmonic Valve: The pulmonic valve was not well visualized. Pulmonic valve regurgitation is not visualized. No evidence of pulmonic stenosis.  Aorta: Aortic dilatation noted. There is moderate dilatation of the aortic root, measuring 49 mm. IAS/Shunts: The interatrial septum was not well visualized.  LEFT VENTRICLE PLAX 2D LVIDd:         5.13 cm     Diastology LVIDs:         4.08 cm     LV e' medial:    4.79 cm/s LV PW:         1.45 cm     LV E/e' medial:  9.7 LV IVS:        1.60 cm     LV e' lateral:   6.31 cm/s LVOT diam:     2.60 cm     LV E/e' lateral: 7.4 LV SV:         73 LV SV Index:   32 LVOT Area:     5.31 cm  LV Volumes (MOD) LV vol d, MOD A2C: 73.9 ml LV vol d, MOD A4C: 75.7 ml LV vol s, MOD A2C: 54.6 ml LV vol s, MOD A4C: 54.8 ml LV SV MOD A2C:     19.3 ml LV SV MOD A4C:     75.7 ml LV SV MOD BP:      20.4 ml RIGHT VENTRICLE            IVC RV S prime:     3.70 cm/s  IVC diam: 2.60 cm TAPSE (M-mode): 0.6 cm LEFT ATRIUM             Index        RIGHT ATRIUM           Index LA diam:        4.40 cm 1.93 cm/m   RA Area:     12.20 cm  LA Vol (A2C):   50.8 ml 22.23 ml/m  RA Volume:   23.20 ml  10.15 ml/m LA Vol (A4C):   36.3 ml 15.89 ml/m LA Biplane Vol: 44.0 ml 19.26 ml/m  AORTIC VALVE AV Area (Vmax):    2.57 cm AV Area (Vmean):   2.37 cm AV Area (VTI):     2.26 cm AV Vmax:           194.00 cm/s AV Vmean:          131.500 cm/s AV VTI:            0.324 m AV Peak Grad:      15.1 mmHg AV Mean Grad:      8.5 mmHg LVOT Vmax:         93.80 cm/s LVOT Vmean:        58.600 cm/s LVOT VTI:          0.138 m LVOT/AV VTI ratio: 0.43  AORTA Ao Root diam: 4.90 cm Ao Asc diam:  3.60 cm MITRAL VALVE MV Area (PHT): 4.21 cm    SHUNTS MV Decel Time: 180 msec    Systemic VTI:  0.14 m MV E velocity: 46.40 cm/s  Systemic Diam: 2.60 cm Rachelle Hora Croitoru MD Electronically signed by Thurmon Fair MD Signature Date/Time: 07/01/2023/12:39:57 PM    Final    DG Chest 2 View Result Date: 06/30/2023 CLINICAL DATA:  Chest pain EXAM: CHEST - 2 VIEW COMPARISON:  03/24/2023 FINDINGS: Post sternotomy changes and valve prosthesis. No acute airspace disease or pleural effusion.  Stable cardiomediastinal silhouette with aortic atherosclerosis. No pneumothorax IMPRESSION: No active cardiopulmonary disease. Electronically Signed   By: Jasmine Pang M.D.   On: 06/30/2023 18:56     Subjective: Patient seen examined bedside, resting calmly.  Lying in bed.  Seen by cardiology/electrophysiology this morning and transition back to oral amiodarone.  Okay for discharge home.  No needs identified by therapy.  No specific questions from patient this morning.  Denies headache, no dizziness, no chest pain, no palpitations, no shortness of breath, no abdominal pain, no fever/chills/night sweats, no nausea/vomitus diarrhea, no focal weakness, no fatigue, no paresthesia.  No acute events overnight per nursing staff.  Discharge Exam: Vitals:   07/02/23 0432 07/02/23 0808  BP: 138/68 (!) 154/93  Pulse: (!) 54 (!) 57  Resp: 18 14  Temp: 97.6 F (36.4 C) 98 F (36.7 C)  SpO2: 97% 96%   Vitals:   07/01/23 2049 07/01/23 2300 07/02/23 0432 07/02/23 0808  BP: 124/81 127/82 138/68 (!) 154/93  Pulse: (!) 52 (!) 53 (!) 54 (!) 57  Resp: 19 16 18 14   Temp: 98 F (36.7 C) 98.1 F (36.7 C) 97.6 F (36.4 C) 98 F (36.7 C)  TempSrc: Oral Oral Oral Oral  SpO2: 99% 97% 97% 96%  Weight:   95.6 kg   Height:        Physical Exam: GEN: NAD, alert and oriented to person/place, elderly in appearance HEENT: NCAT, PERRL, EOMI, sclera clear, MMM PULM: CTAB w/o wheezes/crackles, normal respiratory effort, on room air CV: RRR w/o M/G/R GI: abd soft, NTND, NABS, no R/G/M MSK: no peripheral edema, muscle strength globally intact 5/5 bilateral upper/lower extremities NEURO: CN II-XII intact, no focal deficits, sensation to light touch intact PSYCH: normal mood/affect Integumentary: dry/intact, no rashes or wounds    The results of significant diagnostics from this hospitalization (including imaging, microbiology, ancillary and laboratory) are listed below for reference.     Labs: BNP (last  3 results) Recent Labs    09/14/22 1903 06/30/23 1825  BNP 88.4 293.4*   Basic Metabolic Panel: Recent Labs  Lab 06/30/23 1825 07/01/23 0442 07/02/23 0242  NA 139 139 136  K 3.7 4.9 3.8  CL 105 110 102  CO2 23 21* 24  GLUCOSE 177* 159* 179*  BUN 42* 40* 44*  CREATININE 2.86* 2.98* 3.10*  CALCIUM 9.4 8.3* 9.2  MG 2.1  --  2.0   Liver Function Tests: Recent Labs  Lab 06/30/23 1825 07/01/23 0442  AST 18 30  ALT 19 19  ALKPHOS 93 79  BILITOT 0.8 0.7  PROT 6.4* 5.5*  ALBUMIN 3.6 3.2*   Recent Labs  Lab 06/30/23 1825  LIPASE 39   CBC: Recent Labs  Lab 06/30/23 1825 07/01/23 0334  WBC 6.3 8.2  HGB 15.0 14.8  HCT 44.9 43.7  MCV 85.4 84.4  PLT 120* 113*    CBG: Recent Labs  Lab 07/01/23 1354 07/01/23 1508 07/01/23 1701 07/01/23 2121 07/02/23 0609  GLUCAP 186* 187* 127* 200* 147*    Thyroid function studies Recent Labs    07/01/23 0334  TSH 3.420    Recent Labs  Lab 06/30/23 1825 07/01/23 0334  WBC 6.3 8.2      Time coordinating discharge: Over 30 minutes  SIGNED:   Alvira Philips [place], DO  [facility] 07/02/2023, 10:04 AM

## 2023-07-07 IMAGING — DX DG CHEST 1V PORT
1 series · 1 of 1 positions shown · non-contrast
Comparison: 12/17/2020

CLINICAL DATA: Chest pain

EXAM:
PORTABLE CHEST 1 VIEW

[chest]
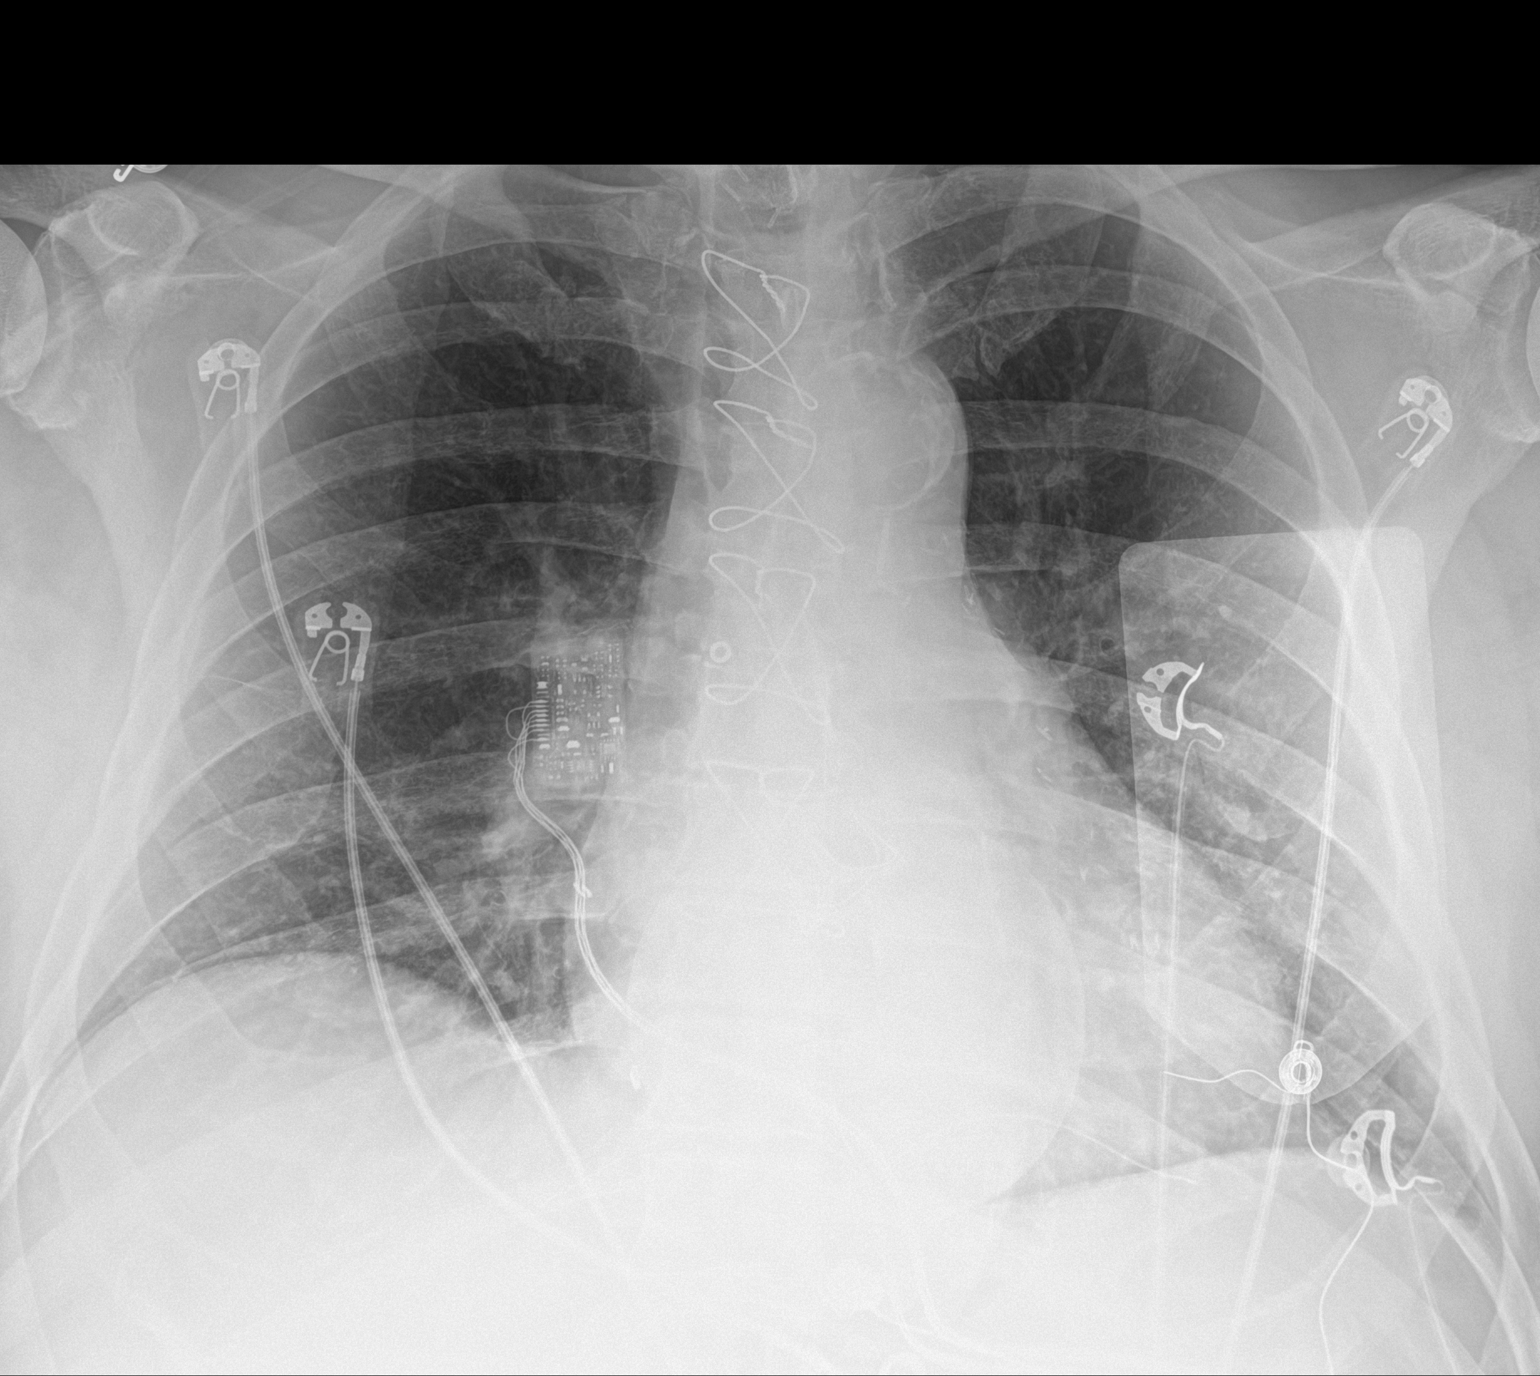

[1 of 1 positions shown; findings below may reference images not displayed]

FINDINGS: Lungs are well expanded, symmetric, and clear. No pneumothorax or
pleural effusion. Cardiac size within normal limits. Coronary artery
bypass grafting has been performed. Pulmonary vascularity is normal.
Osseous structures are age-appropriate. No acute bone abnormality.
IMPRESSION: No active disease.

## 2023-09-09 ENCOUNTER — Encounter: Payer: Self-pay | Admitting: Diagnostic Neuroimaging

## 2023-09-09 ENCOUNTER — Ambulatory Visit: Payer: Medicare HMO | Admitting: Diagnostic Neuroimaging

## 2023-09-09 VITALS — BP 137/76 | HR 64 | Ht 74.0 in | Wt 216.0 lb

## 2023-09-09 DIAGNOSIS — I6310 Cerebral infarction due to embolism of unspecified precerebral artery: Secondary | ICD-10-CM | POA: Diagnosis not present

## 2023-09-09 DIAGNOSIS — F01B0 Vascular dementia, moderate, without behavioral disturbance, psychotic disturbance, mood disturbance, and anxiety: Secondary | ICD-10-CM | POA: Diagnosis not present

## 2023-09-09 NOTE — Patient Instructions (Addendum)
 moderate MEMORY IMPAIRMENT (vascular dementia) - continue medical mgmt; safety and supervision reviewed - try to stay active physically and get some exercise (at least 15-30 minutes per day) - eat a nutritious diet with lean protein, plants / vegetables, whole grains; avoid ultra-processed foods - increase social activities, brain stimulation, games, puzzles, hobbies, crafts, arts, music; try new activities; keep it fun! - aim for at least 7-8 hours sleep per night (or more) - avoid smoking and alcohol - caution with medications, finances; no driving - caregiver resources provided (including [email or web address])

## 2023-09-09 NOTE — Progress Notes (Signed)
 [facility]  PATIENT: Shane Holloway DOB: 25-Sep-1950  REFERRING CLINICIAN: Milinda Antis, PA-C HISTORY FROM: patient  REASON FOR VISIT: new consult   HISTORICAL  CHIEF COMPLAINT:  Chief Complaint  Patient presents with   New Patient (Initial Visit)    Patient in room #7 with tammy woody. Patient states he here to discuss his stroke and dementia.    HISTORY OF PRESENT ILLNESS:   UPDATE (09/09/23, VRP): 73 year old male here for evaluation of hospital stroke follow-up.  Patient presented to hospital in August 2024 with stroke.  Had another admission in September 2024 for worsening nausea, lightheadedness, slurred speech, found to have additional infarcts.  Patient had been on Eliquis for left IJ and subclavian DVT.  He had stopped it for a dental procedure prior to the stroke in August 2024 but resumed it 1 day after procedure.  Stroke occurred about 1 week after the procedure.  Also having some short-term memory loss, confusion, diagnosed with vascular dementia by PCP.  Symptoms have worsened since his last strokes.   REVIEW OF SYSTEMS: Full 14 system review of systems performed and negative with exception of: as per HPI.  ALLERGIES: Allergies  Allergen Reactions   Altace [Ramipril] Cough   Shellfish Allergy Nausea And Vomiting   Sulfa Antibiotics Hives    HOME MEDICATIONS: Outpatient Medications Prior to Visit  Medication Sig Dispense Refill   acetaminophen (TYLENOL) 325 MG tablet Take 1-2 tablets (325-650 mg total) by mouth every 4 (four) hours as needed for mild pain.     amiodarone (PACERONE) 200 MG tablet Take 1 tablet by mouth daily.     apixaban (ELIQUIS) 5 MG TABS tablet Take 1 tablet (5 mg total) by mouth 2 (two) times daily.     atorvastatin (LIPITOR) 80 MG tablet Take 80 mg by mouth daily.     empagliflozin (JARDIANCE) 10 MG TABS tablet Take 1 tablet by mouth daily.     ezetimibe (ZETIA) 10 MG tablet Take 10 mg by mouth daily.      finasteride (PROSCAR) 5 MG tablet Take 5 mg by mouth daily.     insulin glargine (LANTUS) 100 UNIT/ML Solostar Pen Inject 10 Units into the skin daily. (Patient taking differently: Inject 14 Units into the skin daily.) 5 mL 0   Insulin Pen Needle (AURORA PEN NEEDLES) 31G X 6 MM MISC 1 each by Does not apply route daily at 2 PM. 30 each 0   metoprolol succinate (TOPROL-XL) 50 MG 24 hr tablet Take 50 mg by mouth daily.     nitroGLYCERIN (NITROSTAT) 0.4 MG SL tablet Place under the tongue.     tamsulosin (FLOMAX) 0.4 MG CAPS capsule Take 1 capsule (0.4 mg total) by mouth daily. 30 capsule 0   No facility-administered medications prior to visit.      PHYSICAL EXAM  GENERAL EXAM/CONSTITUTIONAL: Vitals:  Vitals:   09/09/23 1101  BP: 137/76  Pulse: 64  Weight: 216 lb (98 kg)  Height: 6\' 2"  (1.88 m)   Body mass index is 27.73 kg/m. Wt Readings from Last 3 Encounters:  09/09/23 216 lb (98 kg)  07/02/23 210 lb 12.2 oz (95.6 kg)  02/23/23 212 lb 4.9 oz (96.3 kg)   Patient is in no distress; well developed, nourished and groomed; neck is supple  CARDIOVASCULAR: Examination of carotid arteries is normal; no carotid bruits Regular rate and rhythm, SYSTOLIC MURMUR Examination of peripheral vascular system by observation and palpation is normal  EYES: Ophthalmoscopic exam of optic  discs and posterior segments is normal; no papilledema or hemorrhages No results found.  MUSCULOSKELETAL: Gait, strength, tone, movements noted in Neurologic exam below  NEUROLOGIC: MENTAL STATUS:      No data to display         awake, alert, oriented to person, Ginette Otto, [place]"); ("Tuesday", doesn't know month, "2002", date) DECR MEMORY DECR attention and concentration language fluent, comprehension intact, naming intact fund of knowledge appropriate  CRANIAL NERVE:  2nd - no papilledema on fundoscopic exam 2nd, 3rd, 4th, 6th - pupils equal and reactive to light, visual fields full to  confrontation, extraocular muscles intact, no nystagmus 5th - facial sensation symmetric 7th - facial strength symmetric 8th - hearing intact 9th - palate elevates symmetrically, uvula midline 11th - shoulder shrug symmetric 12th - tongue protrusion midline  MOTOR:  normal bulk and tone, full strength in the BUE, BLE  SENSORY:  normal and symmetric to light touch, temperature, vibration  COORDINATION:  finger-nose-finger, fine finger movements normal  REFLEXES:  deep tendon reflexes TRACE and symmetric  GAIT/STATION:  narrow based gait; USING WALKER     DIAGNOSTIC DATA (LABS, IMAGING, TESTING) - I reviewed patient records, labs, notes, testing and imaging myself where available.  Lab Results  Component Value Date   WBC 8.2 07/01/2023   HGB 14.8 07/01/2023   HCT 43.7 07/01/2023   MCV 84.4 07/01/2023   PLT 113 (L) 07/01/2023      Component Value Date/Time   NA 136 07/02/2023 0242   K 3.8 07/02/2023 0242   CL 102 07/02/2023 0242   CO2 24 07/02/2023 0242   GLUCOSE 179 (H) 07/02/2023 0242   BUN 44 (H) 07/02/2023 0242   CREATININE 3.10 (H) 07/02/2023 0242   CREATININE 0.79 09/23/2011 1201   CALCIUM 9.2 07/02/2023 0242   PROT 5.5 (L) 07/01/2023 0442   ALBUMIN 3.2 (L) 07/01/2023 0442   AST 30 07/01/2023 0442   ALT 19 07/01/2023 0442   ALKPHOS 79 07/01/2023 0442   BILITOT 0.7 07/01/2023 0442   GFRNONAA 20 (L) 07/02/2023 0242   GFRAA  03/23/2010 1000    >60        The eGFR has been calculated using the MDRD equation. This calculation has not been validated in all clinical situations. eGFR's persistently <60 mL/min signify possible Chronic Kidney Disease.   Lab Results  Component Value Date   CHOL 90 01/25/2023   HDL 23 (L) 01/25/2023   LDLCALC 29 01/25/2023   LDLDIRECT 58.1 12/11/2021   TRIG 188 (H) 01/25/2023   CHOLHDL 3.9 01/25/2023   Lab Results  Component Value Date   HGBA1C 8.8 (H) 01/25/2023   Lab Results  Component Value Date   VITAMINB12  221 12/16/2021   Lab Results  Component Value Date   TSH 3.420 07/01/2023      ASSESSMENT AND PLAN  73 y.o. year old male here with:   Dx:  1. Cerebrovascular accident (CVA) due to embolism of precerebral artery (HCC)   2. Moderate vascular dementia without behavioral disturbance, psychotic disturbance, mood disturbance, or anxiety (HCC)      PLAN:  Sudden-onset nausea/vomiting, recent L posterior cerebral infarct and new CVAs to contralateral R frontal lobe, pons, midbrain  Etiology:  hypertension, diabetes +/- cardioembolic or paradoxical emboli; eliquis was stopped for dental procedure 1 week prior to stroke  CT head: No acute abnormality. Cerebral atrophy and chronic small vessel ischemic disease and multiple chronic infarcts. CT A/P w/contrast: Intermediate-high grade SBO  MRI Brain: New sub-centimeter acute/early  subacute ischemic infarcts in contralateral right frontal lobe and right midbrain/pons. Advanced atrophy. MRA H/N without contrast: 1. No large vessel occlusion. 2. Severe distal right P2 stenosis with attenuation of more distal right PCA branches. 3. Patent cervical carotid and vertebral arteries without evidence of a significant stenosis or dissection within above described study limitations (mainly affecting assessment of the proximal vertebral arteries). VAS [place] Transcranial Doppler w Bubbles: negative on 8/17.  2D Echo (8/17): No intracardiac source of embolism detected. 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 Plan for long term anticoagulation.   Hypertension Home meds: Entresto 49-51 BID, metoprolol succinate 50 mg   Hx of SVT Home Amidarone 200 mg po QD   Hyperlipidemia Home meds:  ezitimibe 10 mg every day  LDL 29, goal < 70   Diabetes type II Home meds:  Levemir 12u at bedtime, NPH 10u BID, Semaglutide 1mg /wk, empagliflozin 10 mg every day  HgbA1c 8.8, goal < 7.0 follow-up with PCP  for better DM control  moderate MEMORY IMPAIRMENT (vascular dementia) - continue medical mgmt; safety and supervision reviewed - try to stay active physically and get some exercise (at least 15-30 minutes per day) - eat a nutritious diet with lean protein, plants / vegetables, whole grains; avoid ultra-processed foods - increase social activities, brain stimulation, games, puzzles, hobbies, crafts, arts, music; try new activities; keep it fun! - aim for at least 7-8 hours sleep per night (or more) - avoid smoking and alcohol - caution with medications, finances; no driving - caregiver resources provided (including [email or web address])    Return for return to PCP, pending if symptoms worsen or fail to improve.    Suanne Marker, MD 09/09/2023, 11:44 AM Certified in Neurology, Neurophysiology and Neuroimaging  [facility] [address] [place], [place] [address] [phone]

## 2023-09-28 ENCOUNTER — Other Ambulatory Visit: Payer: Self-pay

## 2023-09-28 ENCOUNTER — Emergency Department (HOSPITAL_COMMUNITY)
Admission: EM | Admit: 2023-09-28 | Discharge: 2023-09-28 | Disposition: A | Discharge status: Home / Self Care | Attending: Emergency Medicine | Admitting: Emergency Medicine

## 2023-09-28 ENCOUNTER — Emergency Department (HOSPITAL_COMMUNITY)

## 2023-09-28 DIAGNOSIS — R079 Chest pain, unspecified: Secondary | ICD-10-CM | POA: Diagnosis present

## 2023-09-28 DIAGNOSIS — F0390 Unspecified dementia without behavioral disturbance: Secondary | ICD-10-CM | POA: Insufficient documentation

## 2023-09-28 DIAGNOSIS — N189 Chronic kidney disease, unspecified: Secondary | ICD-10-CM | POA: Diagnosis not present

## 2023-09-28 DIAGNOSIS — Z794 Long term (current) use of insulin: Secondary | ICD-10-CM | POA: Diagnosis not present

## 2023-09-28 DIAGNOSIS — Z951 Presence of aortocoronary bypass graft: Secondary | ICD-10-CM | POA: Diagnosis not present

## 2023-09-28 DIAGNOSIS — Z7901 Long term (current) use of anticoagulants: Secondary | ICD-10-CM | POA: Insufficient documentation

## 2023-09-28 DIAGNOSIS — E1122 Type 2 diabetes mellitus with diabetic chronic kidney disease: Secondary | ICD-10-CM | POA: Diagnosis not present

## 2023-09-28 LAB — CBC WITH DIFFERENTIAL/PLATELET
Abs Immature Granulocytes: 0.05 10*3/uL (ref 0.00–0.07)
Basophils Absolute: 0.1 10*3/uL (ref 0.0–0.1)
Basophils Relative: 1 %
Eosinophils Absolute: 0.2 10*3/uL (ref 0.0–0.5)
Eosinophils Relative: 3 %
HCT: 40.6 % (ref 39.0–52.0)
Hemoglobin: 14 g/dL (ref 13.0–17.0)
Immature Granulocytes: 1 %
Lymphocytes Relative: 19 %
Lymphs Abs: 1.1 10*3/uL (ref 0.7–4.0)
MCH: 29.7 pg (ref 26.0–34.0)
MCHC: 34.5 g/dL (ref 30.0–36.0)
MCV: 86.2 fL (ref 80.0–100.0)
Monocytes Absolute: 0.5 10*3/uL (ref 0.1–1.0)
Monocytes Relative: 8 %
Neutro Abs: 4 10*3/uL (ref 1.7–7.7)
Neutrophils Relative %: 68 %
Platelets: 100 10*3/uL — ABNORMAL LOW (ref 150–400)
RBC: 4.71 MIL/uL (ref 4.22–5.81)
RDW: 13.2 % (ref 11.5–15.5)
WBC: 5.9 10*3/uL (ref 4.0–10.5)
nRBC: 0 % (ref 0.0–0.2)

## 2023-09-28 LAB — COMPREHENSIVE METABOLIC PANEL WITH GFR
ALT: 17 U/L (ref 0–44)
AST: 19 U/L (ref 15–41)
Albumin: 3.4 g/dL — ABNORMAL LOW (ref 3.5–5.0)
Alkaline Phosphatase: 77 U/L (ref 38–126)
Anion gap: 10 (ref 5–15)
BUN: 34 mg/dL — ABNORMAL HIGH (ref 8–23)
CO2: 22 mmol/L (ref 22–32)
Calcium: 9 mg/dL (ref 8.9–10.3)
Chloride: 107 mmol/L (ref 98–111)
Creatinine, Ser: 2.48 mg/dL — ABNORMAL HIGH (ref 0.61–1.24)
GFR, Estimated: 27 mL/min — ABNORMAL LOW (ref >60)
Glucose, Bld: 200 mg/dL — ABNORMAL HIGH (ref 70–99)
Potassium: 3.9 mmol/L (ref 3.5–5.1)
Sodium: 139 mmol/L (ref 135–145)
Total Bilirubin: 0.8 mg/dL (ref 0.0–1.2)
Total Protein: 5.9 g/dL — ABNORMAL LOW (ref 6.5–8.1)

## 2023-09-28 LAB — TROPONIN I (HIGH SENSITIVITY)
Troponin I (High Sensitivity): 14 ng/L (ref <18)
Troponin I (High Sensitivity): 13 ng/L (ref <18)

## 2023-09-28 MED ORDER — ACETAMINOPHEN 325 MG PO TABS
650.0000 mg | ORAL_TABLET | Freq: Once | ORAL | Status: AC
  Administered 2023-09-28: 650 mg via ORAL
  Filled 2023-09-28: qty 2

## 2023-09-28 NOTE — ED Triage Notes (Signed)
 BIB [facility] from home with concern of generalized central intermittent chest pain that started this morning,no radiation, no shortness of breath No nausea, no dizziness, no weakness. Pt ambulatory with EMS. Patient is on thinners  Hx of CABG, MI, Stroke  Aspirin  324 mg given en route.   20 left hand   HR70 95RA BP136/84 RR20

## 2023-09-28 NOTE — ED Provider Notes (Signed)
 [place] [facility] Provider Note   CSN: [id number] Arrival date & time: 09/28/23  1430     History  Chief Complaint  Patient presents with   Chest Pain    Shane Holloway is a 73 y.o. male.  Patient here with intermittent chest pain here recently.  Started this morning now resolved.  No nausea no dizziness no weakness.  History of CABG stroke MI.  Given aspirin  en route.  He denies any recent surgery or travel.  He is on Eliquis .  Denies any exertional symptoms.  Does not think that it is worse with eating.  Cannot tell me if this feels like his prior heart attacks.  He does have a history of vascular dementia vertigo CKD DVT.  The history is provided by the patient.       Home Medications Prior to Admission medications   Medication Sig Start Date End Date Taking? Authorizing Provider  acetaminophen  (TYLENOL ) 325 MG tablet Take 1-2 tablets (325-650 mg total) by mouth every 4 (four) hours as needed for mild pain. 02/25/23   Setzer, Sandra J, PA-C  amiodarone  (PACERONE ) 200 MG tablet Take 1 tablet by mouth daily. 04/17/21 09/09/23  [provider]  apixaban  (ELIQUIS ) 5 MG TABS tablet Take 1 tablet (5 mg total) by mouth 2 (two) times daily. 02/18/23   Vada Garibaldi, MD  atorvastatin  (LIPITOR ) 80 MG tablet Take 80 mg by mouth daily. 12/12/20   [provider]  empagliflozin  (JARDIANCE ) 10 MG TABS tablet Take 1 tablet by mouth daily. 08/08/21   [provider]  ezetimibe  (ZETIA ) 10 MG tablet Take 10 mg by mouth daily.    [provider]  finasteride  (PROSCAR ) 5 MG tablet Take 5 mg by mouth daily. 09/05/22   [provider]  insulin  glargine (LANTUS ) 100 UNIT/ML Solostar Pen Inject 10 Units into the skin daily. Patient taking differently: Inject 14 Units into the skin daily. 02/25/23   Setzer, Sandra J, PA-C  Insulin  Pen Needle (AURORA PEN NEEDLES) 31G X 6 MM MISC 1 each by Does not apply route daily at 2 PM.  02/25/23   Setzer, Sandra J, PA-C  metoprolol  succinate (TOPROL -XL) 50 MG 24 hr tablet Take 50 mg by mouth daily.    [provider]  nitroGLYCERIN  (NITROSTAT ) 0.4 MG SL tablet Place under the tongue. 01/23/22   [provider]  tamsulosin  (FLOMAX ) 0.4 MG CAPS capsule Take 1 capsule (0.4 mg total) by mouth daily. 02/26/23   Setzer, Sandra J, PA-C      Allergies    Altace [ramipril], Shellfish allergy, and Sulfa antibiotics    Review of Systems   Review of Systems  Physical Exam Updated Vital Signs BP 133/87 (BP Location: Right Arm)   Pulse (!) 55   Temp 98.2 F (36.8 C) (Oral)   Resp 15   Ht 6\' 2"  (1.88 m)   Wt 97.9 kg   SpO2 99%   BMI 27.71 kg/m  Physical Exam Vitals and nursing note reviewed.  Constitutional:      General: He is not in acute distress.    Appearance: He is well-developed. He is not ill-appearing.  HENT:     Head: Normocephalic and atraumatic.  Eyes:     Extraocular Movements: Extraocular movements intact.     Conjunctiva/sclera: Conjunctivae normal.     Pupils: Pupils are equal, round, and reactive to light.  Cardiovascular:     Rate and Rhythm: Normal rate and regular rhythm.  Pulses:          Radial pulses are 2+ on the right side and 2+ on the left side.     Heart sounds: Normal heart sounds. No murmur heard. Pulmonary:     Effort: Pulmonary effort is normal. No respiratory distress.     Breath sounds: Normal breath sounds.  Abdominal:     Palpations: Abdomen is soft.     Tenderness: There is no abdominal tenderness.  Musculoskeletal:        General: No swelling. Normal range of motion.     Cervical back: Normal range of motion and neck supple.     Right lower leg: No edema.     Left lower leg: No edema.  Skin:    General: Skin is warm and dry.     Capillary Refill: Capillary refill takes less than 2 seconds.  Neurological:     General: No focal deficit present.     Mental Status: He is alert.  Psychiatric:        Mood  and Affect: Mood normal.     ED Results / Procedures / Treatments   Labs (all labs ordered are listed, but only abnormal results are displayed) Labs Reviewed  CBC WITH DIFFERENTIAL/PLATELET - Abnormal; Notable for the following components:      Result Value   Platelets 100 (*)    All other components within normal limits  COMPREHENSIVE METABOLIC PANEL WITH GFR - Abnormal; Notable for the following components:   Glucose, Bld 200 (*)    BUN 34 (*)    Creatinine, Ser 2.48 (*)    Total Protein 5.9 (*)    Albumin 3.4 (*)    GFR, Estimated 27 (*)    All other components within normal limits  TROPONIN I (HIGH SENSITIVITY)  TROPONIN I (HIGH SENSITIVITY)    EKG EKG Interpretation Date/Time:  Sunday September 28 2023 14:37:30 EDT Ventricular Rate:  61 PR Interval:  210 QRS Duration:  126 QT Interval:  482 QTC Calculation: 485 R Axis:   -61  Text Interpretation: Sinus rhythm with 1st degree A-V block Left axis deviation Non-specific intra-ventricular conduction block Cannot rule out Anterior infarct , age undetermined T wave abnormality, consider lateral ischemia Abnormal ECG When compared with ECG of 30-Jun-2023 19:04, PREVIOUS ECG IS PRESENT Confirmed by Lowery Rue [phone]) on 09/28/2023 3:06:09 PM  Radiology DG Chest 1 View Result Date: 09/28/2023 CLINICAL DATA:  Chest pain EXAM: CHEST  1 VIEW COMPARISON:  06/30/2023 FINDINGS: Post sternotomy changes and valve prosthesis. No acute airspace disease, pleural effusion, or pneumothorax. Stable cardiomediastinal silhouette with aortic atherosclerosis. IMPRESSION: No active disease. Electronically Signed   By: Esmeralda Hedge M.D.   On: 09/28/2023 16:19    Procedures Procedures    Medications Ordered in ED Medications  acetaminophen  (TYLENOL ) tablet 650 mg (650 mg Oral Given 09/28/23 1824)    ED Course/ Medical Decision Making/ A&P                                 Medical Decision Making Amount and/or Complexity of Data  Reviewed Labs: ordered. Radiology: ordered.  Risk OTC drugs.   Shane Holloway is here with chest pain.  History of CABG, CKD, high cholesterol diabetes.  Pain may be here the last few days none currently.  Does not seem exertional.  Does not seem with eating.  He feels comfortable now.  EKG shows sinus rhythm.  Fairly unchanged from prior EKGs.  Some T wave inversions throughout but seen on similar EKGs.  He does not endorse any infectious symptoms.  Overall we will get CBC BMP troponin chest x-ray and reevaluate.  Patient had a heart cath couple years ago had stent placed.  Per my review and interpretation of labs creatinine is at baseline.  No significant leukocytosis anemia or electrolyte abnormality otherwise.  Troponin negative x 2.  Chest x-ray with no evidence of pneumonia or pneumothorax.  Family states that he has been dealing with the grief from his brother that passed away recently.  They are very close friends.  He has not had any chest pain here.  He has overall atypical story for ACS reassuring troponins reassuring EKG.  I think he can follow-up with cardiology outpatient.  He understands return precautions.  This chart was dictated using voice recognition software.  Despite best efforts to proofread,  errors can occur which can change the documentation meaning.         Final Clinical Impression(s) / ED Diagnoses Final diagnoses:  Nonspecific chest pain    Rx / DC Orders ED Discharge Orders     None         Lowery Rue, DO 09/28/23 1936

## 2023-09-28 NOTE — Discharge Instructions (Signed)
 Follow-up with your cardiologist this week.  Return if symptoms worsen.

## 2023-12-26 ENCOUNTER — Encounter: Payer: Self-pay | Admitting: Advanced Practice Midwife

## 2024-01-20 ENCOUNTER — Encounter (HOSPITAL_COMMUNITY): Payer: Self-pay

## 2024-01-20 ENCOUNTER — Other Ambulatory Visit: Payer: Self-pay

## 2024-01-20 ENCOUNTER — Emergency Department (HOSPITAL_COMMUNITY)

## 2024-01-20 ENCOUNTER — Emergency Department (HOSPITAL_COMMUNITY)
Admission: EM | Admit: 2024-01-20 | Discharge: 2024-01-20 | Disposition: A | Discharge status: Home / Self Care | Attending: Emergency Medicine | Admitting: Emergency Medicine

## 2024-01-20 DIAGNOSIS — I2510 Atherosclerotic heart disease of native coronary artery without angina pectoris: Secondary | ICD-10-CM | POA: Diagnosis not present

## 2024-01-20 DIAGNOSIS — I509 Heart failure, unspecified: Secondary | ICD-10-CM | POA: Diagnosis not present

## 2024-01-20 DIAGNOSIS — R140 Abdominal distension (gaseous): Secondary | ICD-10-CM | POA: Diagnosis present

## 2024-01-20 DIAGNOSIS — Z951 Presence of aortocoronary bypass graft: Secondary | ICD-10-CM | POA: Diagnosis not present

## 2024-01-20 DIAGNOSIS — N3000 Acute cystitis without hematuria: Secondary | ICD-10-CM | POA: Insufficient documentation

## 2024-01-20 LAB — URINALYSIS, ROUTINE W REFLEX MICROSCOPIC
Bilirubin Urine: NEGATIVE
Glucose, UA: 150 mg/dL — AB
Hgb urine dipstick: NEGATIVE
Ketones, ur: NEGATIVE mg/dL
Nitrite: NEGATIVE
Protein, ur: 100 mg/dL — AB
Specific Gravity, Urine: 1.014 (ref 1.005–1.030)
WBC, UA: >50 WBC/hpf (ref 0–5)
pH: 5 (ref 5.0–8.0)

## 2024-01-20 LAB — HEPATIC FUNCTION PANEL
ALT: 27 U/L (ref 0–44)
AST: 23 U/L (ref 15–41)
Albumin: 3.9 g/dL (ref 3.5–5.0)
Alkaline Phosphatase: 89 U/L (ref 38–126)
Bilirubin, Direct: 0.1 mg/dL (ref 0.0–0.2)
Indirect Bilirubin: 0.9 mg/dL (ref 0.3–0.9)
Total Bilirubin: 1 mg/dL (ref 0.0–1.2)
Total Protein: 6.7 g/dL (ref 6.5–8.1)

## 2024-01-20 LAB — CBC
HCT: 47.5 % (ref 39.0–52.0)
Hemoglobin: 15.8 g/dL (ref 13.0–17.0)
MCH: 28.7 pg (ref 26.0–34.0)
MCHC: 33.3 g/dL (ref 30.0–36.0)
MCV: 86.4 fL (ref 80.0–100.0)
Platelets: 109 K/uL — ABNORMAL LOW (ref 150–400)
RBC: 5.5 MIL/uL (ref 4.22–5.81)
RDW: 13.5 % (ref 11.5–15.5)
WBC: 5.8 K/uL (ref 4.0–10.5)
nRBC: 0 % (ref 0.0–0.2)

## 2024-01-20 LAB — TROPONIN I (HIGH SENSITIVITY)
Troponin I (High Sensitivity): 14 ng/L (ref <18)
Troponin I (High Sensitivity): 12 ng/L (ref <18)

## 2024-01-20 LAB — BRAIN NATRIURETIC PEPTIDE: B Natriuretic Peptide: 138.5 pg/mL — ABNORMAL HIGH (ref 0.0–100.0)

## 2024-01-20 LAB — BASIC METABOLIC PANEL WITH GFR
Anion gap: 10 (ref 5–15)
BUN: 31 mg/dL — ABNORMAL HIGH (ref 8–23)
CO2: 23 mmol/L (ref 22–32)
Calcium: 9.6 mg/dL (ref 8.9–10.3)
Chloride: 107 mmol/L (ref 98–111)
Creatinine, Ser: 2.79 mg/dL — ABNORMAL HIGH (ref 0.61–1.24)
GFR, Estimated: 23 mL/min — ABNORMAL LOW (ref >60)
Glucose, Bld: 198 mg/dL — ABNORMAL HIGH (ref 70–99)
Potassium: 3.9 mmol/L (ref 3.5–5.1)
Sodium: 140 mmol/L (ref 135–145)

## 2024-01-20 LAB — LIPASE, BLOOD: Lipase: 42 U/L (ref 11–51)

## 2024-01-20 MED ORDER — CEPHALEXIN 250 MG PO CAPS
500.0000 mg | ORAL_CAPSULE | Freq: Once | ORAL | Status: AC
  Administered 2024-01-20 (×2): 500 mg via ORAL
  Filled 2024-01-20: qty 2

## 2024-01-20 MED ORDER — CEPHALEXIN 500 MG PO CAPS: 500.0000 mg | ORAL_CAPSULE | Freq: Two times a day (BID) | ORAL | 0 refills | Status: AC

## 2024-01-20 NOTE — ED Notes (Signed)
 Patient ambulated in hallways, gait steady, O2 remains 100% on RA

## 2024-01-20 NOTE — ED Provider Notes (Signed)
 [facility] Emergency Department Provider Note MRN:  [id number]  Arrival date & time: 01/20/24     Chief Complaint   Shortness of Breath and Abdominal Distension   History of Present Illness   Shane Holloway is a 73 y.o. year-old male presents to the ED with chief complaint of SOB.  States that he has had some abdominal swelling.  Symptoms started earlier this week and have been gradually getting worse. Denies cough or fever.  States that he has had some dizziness.  States that he has had some trouble walking due to his legs hurting.  Doesn't wear oxygen at home. Reports foul odor in urine and frequent UTIs.  Hx of CAD, CABG, CHF, DVT.SABRA  History provided by patient.   Review of Systems  Pertinent positive and negative review of systems noted in HPI.    Physical Exam   Vitals:   01/20/24 2245 01/20/24 2341  BP: (!) 150/90   Pulse: (!) 59   Resp: (!) 9   Temp:  97.7 F (36.5 C)  SpO2: 100%     CONSTITUTIONAL:  non toxic-appearing, NAD NEURO:  Alert and oriented x 3, CN 3-12 grossly intact EYES:  eyes equal and reactive ENT/NECK:  Supple, no stridor  CARDIO:  normal rate, regular rhythm, appears well-perfused  PULM:  No respiratory distress, CTAB GI/GU:  non-distended, no focal tenderness MSK/SPINE:  No gross deformities, no edema, moves all extremities  SKIN:  no rash, atraumatic   *Additional and/or pertinent findings included in MDM below  Diagnostic and Interventional Summary    EKG Interpretation Date/Time:  Tuesday January 20 2024 18:28:47 EDT Ventricular Rate:  71 PR Interval:  206 QRS Duration:  128 QT Interval:  432 QTC Calculation: 469 R Axis:   11  Text Interpretation: Normal sinus rhythm When compared with ECG of 28-Sep-2023 14:37, PREVIOUS ECG IS PRESENT Confirmed by Ruthe Cornet [phone]) on 01/20/2024 9:11:27 PM       Labs Reviewed  BASIC METABOLIC PANEL WITH GFR - Abnormal; Notable for the following components:       Result Value   Glucose, Bld 198 (*)    BUN 31 (*)    Creatinine, Ser 2.79 (*)    GFR, Estimated 23 (*)    All other components within normal limits  CBC - Abnormal; Notable for the following components:   Platelets 109 (*)    All other components within normal limits  BRAIN NATRIURETIC PEPTIDE - Abnormal; Notable for the following components:   B Natriuretic Peptide 138.5 (*)    All other components within normal limits  URINALYSIS, ROUTINE W REFLEX MICROSCOPIC - Abnormal; Notable for the following components:   APPearance CLOUDY (*)    Glucose, UA 150 (*)    Protein, ur 100 (*)    Leukocytes,Ua LARGE (*)    Bacteria, UA MANY (*)    All other components within normal limits  HEPATIC FUNCTION PANEL  LIPASE, BLOOD  TROPONIN I (HIGH SENSITIVITY)  TROPONIN I (HIGH SENSITIVITY)    DG Chest 2 View  Final Result      Medications  cephALEXin  (KEFLEX ) capsule 500 mg (500 mg Oral Given 01/20/24 2336)     Procedures  /  Critical Care Procedures  ED Course and Medical Decision Making  I have reviewed the triage vital signs, the nursing notes, and pertinent available records from the EMR.  Social Determinants Affecting Complexity of Care: Patient has no clinically significant social determinants affecting this chief complaint.SABRA  ED Course: Clinical Course as of 01/20/24 2343  Tue Jan 20, 2024  2341 Basic metabolic panel(!) Creatinine is about baseline for patient, no other significant electrolyte abnormality [RB]  2342 CBC(!) No leukocytosis or anemia [RB]  2342 Hepatic function panel Normal LFTs [RB]  2342 Lipase, blood [RB]  2342 Lipase, blood Lipase normal, doubt pancreatitis [RB]  2342 Troponin I (High Sensitivity) Troponins are negative x 2, doubt ACS [RB]  2342 BNP (Order if Patient has history of Heart Failure)(!) Mildly increased BNP, no evidence of effusion or significant vascular congestion on chest x-ray.  Lung sounds are clear.  Doubt CHF exacerbation.  No  edema on lower extremities. [RB]  2343 Urinalysis, Routine w reflex microscopic -Urine, Clean Catch(!) Urinalysis worrisome for recurrent infection.  Will give dose of Keflex  here and discharged home on the same. [RB]    Clinical Course User Index [RB] Vicky Charleston, PA-C    Medical Decision Making Patient here for some complaints of dizziness, foul-smelling and strong odor of urine, and some abdominal distention or discomfort.  Laboratory workup is discussed and ED course, but is all fairly reassuring save for abnormal urinalysis which is worrisome for infection.  Patient does not have any evidence of significant CHF on my exam.  Lung sounds are clear.  Patient ambulates maintaining 100% O2 saturation.  We discussed observation versus discharge.  Patient agreeable with plan for discharge.  Will send home on Keflex .  Patient treated with first dose of oral Keflex  here.  Return precautions discussed.  Amount and/or Complexity of Data Reviewed Labs: ordered. Decision-making details documented in ED Course. Radiology: ordered.  Risk Prescription drug management.         Consultants: No consultations were needed in caring for this patient.   Treatment and Plan: I considered admission due to patient's initial presentation, but after considering the examination and diagnostic results, patient will not require admission and can be discharged with outpatient follow-up.    Final Clinical Impressions(s) / ED Diagnoses     ICD-10-CM   1. Acute cystitis without hematuria  N30.00       ED Discharge Orders          Ordered    cephALEXin  (KEFLEX ) 500 MG capsule  2 times daily        01/20/24 2328              Discharge Instructions Discussed with and Provided to Patient:   Discharge Instructions   None      Vicky Charleston, PA-C 01/20/24 2344    Ruthe Cornet, DO 01/22/24 [phone]

## 2024-01-20 NOTE — ED Triage Notes (Signed)
 Patient BIB [facility] from home for shob x 2hrs and abdominal distension which the nephew noticed for unknown amount of time. Not hypoxic or tachypnic, occasional multifocal pvc's on EKG with EMS. No CP. BP 142/89 HR 70 96% 2L (for comfort only) CBG 209 RR 18

## 2024-01-20 NOTE — ED Triage Notes (Addendum)
 Patient reports constant dizziness all day today, history of vertigo. Patient also has strong urine odor present as well.

## 2024-03-01 NOTE — Progress Notes (Addendum)
 REFERRING PROVIDER: Wanda Bumpers Gladman  ASSESSMENT: Shane Holloway is a 73 y.o. male with history of HFmrEF SVT, AS s/p AVR and ascending aortic replacement, CAD s/p CABG/PCI and stent, DM2, CKD stage IV, CVA with vascular dementia, recurrent DVT on Eliquis , BPH/LUTS on flomax  and finasteride  c/b recurrent UTIs presenting in follow up.   BPH w/LUTS:  Maintained on Flomax  and Finasteride  with minimal to no urinary symptom bother without adverse effects.  Emptying well, previous PVR 0 ml. PVR  today 86 mls. Discussed notable prostatomegaly per recent imaging and his history.  Discussed utility of medication management per alpha blockers and 5 alpha reductase inhibitors as well as risks and benefits as he is already on along with  behavioral modifications and lastly, further evaluation including a local cystoscopy procedure and transrectal ultrasound of the prostate to evaluate the size of his prostate to determine if more advance treatments may be useful including surgical options for prostatic hypertrophy.   Overall symptom bother is very low, he is satisfied with current medication regimen.  Given that he is emptying well and not bothered by symptoms, he deferred cysto/TRUS.  Will consider if symptom bother worsens.   2. Recurrent UTIs:  2 symptomatic infections with positive cultures within a 6 month period meeting definition for recurrent UTIs per AUA guidelines.  8/12 ED visit for suspected UTI, treated with Keflex , unable to find culture results.  Most recent ED visit 8/24 with Klebsiella urine culture with mild suprapubic tenderness, no CVA tenderness per review (Cefepime); blood cultures negative. S/P reassuring CT 8/25 however, S/P cysto 5/5 with c/f large bubble at the dome... difficult to assess if large bladder diverticulum vs urachal remnant vs bladder irregularity.  No cytology to review, given reassuring CT unclear etiology of suspected bladder irregularity. Will discuss  results with attending to determine if further follow up indicated.   Asymptomatic today.  We discussed only treating with antibiotics if patient has a documented positive urine culture with UTI symptoms of dysuria/bladder pain/flank pain or lower urinary tract symptoms, such as urinary frequency, urinary urgency, incontinence, hematuria and/or difficulty voiding. Antibiotic treatment is not recommended if he has a positive culture and no symptoms or symptoms not consistent with UTIs.  We discussed risk factors for UTI including kidney stones, health issues like diabetes, low water intake, intercourse, obstruction causing urinary retention from a urethral stricture and/or neurogenic bladder.   Again, he is emptying his bladder well. Low concern that stone burden is contributing to infections given small, non obstructive nature.  We discussed UTIs and preventative measures. I provided a list of prophylactic supplements and recommended he will follow the dosage recommended by each supplement manufacturer. Deferred D-Mannose given renal risk. Discussed option for probiotic, cranberry, defer Methenamine Hiprex 1g given renal impairment.  We discussed the benefits and other detriments of prophylactic antibiotics, including resistance and removal of endogenous urogenital flora. Will plan to start cephalexin  250mg  PO however, will refer to ID for further recommendations given past limited susceptibilites, allergies, and renal function for further input.   All questions were answered.   PLAN: - Frequent hydration (>1.5-2L/day), avoid constipation (Miralax , senna, or colace as needed); consider Cranberry, probiotic use  - Cysto completed 10/13/23; will discuss results with attending given abnormality at dome (no cytology to review) - Upper tract imaging: up to date without overt nidus for infection - Start low dose Keflex  250 mg every day X 3 mos for antibiotic prophylaxis given 2+ culture-positive UTIs  within a 6 month  period - Tight glycemic control last A1C 7.8 (02/02/24) ; continue follow ups with PCP for this  - Deferred PSA testing today   - Refer to ID for further rec UTI recommendations  - Continue Flomax  and Finasteride , consider cysto/TRUS if symptom bother worsens or impaired emptying  - Continue follows ups as scheduled with nephrology   - Follow up to clinic in 3 months for symptom check   I personally spent 35 minutes face-to-face and non-face-to-face in the care of this patient, which includes all pre, intra, and post visit time on the date of service.  Addendum:  Discussed cysto results with Dr. Lavada, no further follow up indicated.   REASON FOR VISIT:  Follow up LUTs/recurrent UTIS    HISTORY OF PRESENT ILLNESS:  Shane Holloway is a 73 y.o. male who comes in today in consultation at the request of Dr. Lamona for evaluation of UTI. History of BPH on flomax  and finasteride . Other history includes DVT, CVA, CKD stage 3, HFrEF, T2DM, HTN.   UCx Hx: 03/24/23: >100k Klebsiella pneumoniae  05/05/23: >100k Klebsiella (tx cefdinir) 05/20/23: >100k Klebsiella (tx doxycycline)  Symptoms with UTI: increased urgency/frequency, malodorous urine  He does not typically have dysuria No prior hospitalizations for UTI  Symptoms have completely resolved with prior antibiotic use   RUS 05/2023: bilateral stones up to 6 mm and BPH  No known stone history - never passed a stone that he remembers He denies flank pain  At baseline he reports nocturia x5. He reports a strong stream with no splitting or spraying. He notes intermittency.   He notes malodorous urine today.   He denies gross hematuria.   History of T2DM with recent A1c 7.2. Currently managed with insulin .   Interval history 03/02/24:  S/P cysto 10/13/23 with [facility] for recurrent UTIS showing BPH with outlet obstruction with  Mild bladder trabeculations and abnormality at bladder dome large bubble at the dome...  difficult to assess if large bladder diverticulum vs urachal remnant vs bladder irregularity.   Per cysto note:  Plan:  Will have outside CT scans from September and October uploaded in our system for review. Follow-up with [facility] on cystoscopy and recurrent UTI   CT A/P 02/02/24 with notable prostatomegaly, moderately distended urinary bladder, Nonobstructing 4 mm left lower pole calculus, simple right renal cyst without hydro.   2 ED visits since last clinic visit:  - 01/20/24: dizziness, foul-smelling and strong odor of urine, and some abdominal distention or discomfort. Treated for UTI with Keflex  and discharged.  - 02/02/24: UTI symptoms did not improved. Presented to ED with 2 weeks of anorexia, fever, progressive weakness, malaise found to have two new strokes Right cerebellar stroke of PICA and left parieto-occipital infarct  and c/f urosepsis.   Sherrye he reports doing well.  In regard to LUTs history, currently, he denies any bothersome urinary symptoms; denies any straining/weak stream urgency, frequency, or leakage.  Has continued to take Flomax  and finasteride  daily without adverse effects. Fluids: 1- 2 bottles /day  Constipation: None  Overall, reports no urinary bother today, feels satisfied with current symptoms with Flomax  and Finasteride  use.   In regard to rec UTI history:  8/12 visit for UTI symptoms as above, treated with Keflex  (unable to find culture from this visit); symptoms persisted to 8/24 ED visit.  Klebsiella positive culture 02/01/24 (unclear if symptoms present as patient does not recall having any symptoms however, he was treated).  No further positive cultures or noted infections  since last visit.   He reports that he does not recall any symptoms with recurrent infections till he starts to experience fevers and chills leading to ED visits/care.  Symptoms with most recent infections per chart review noted above.  Continues to follow with nephrology for CKD  history; working on blood sugar control with PCP lasst A1C 7.8 02/02/24.   PVR: 86 ml  IPSS: Did not complete   Urine culture hx:  >100,000 CFU/mL Klebsiella pneumoniae (Ampicillin, Macrobid, tetracycline resistant)  - 09/01/23 >100,000 CFU/mL Klebsiella pneumoniae (Ampicillin resistant)  - 05/30/23 >100,000 CFU/mL Klebsiella pneumoniae   PSA:  Lab Results  Component Value Date/Time   PSA 2.71 11/17/2017 01:27 PM   PSA 2.49 07/28/2015 02:33 PM   PSA 2.55 02/21/2015 12:04 PM     PAST MEDICAL HISTORY:  Past Medical History:  Diagnosis Date  . Acute renal failure superimposed on stage 3 chronic kidney disease (CMS-HCC) 04/20/2017  . BPH (benign prostatic hyperplasia)   . CHF (congestive heart failure)    (CMS-HCC)   . Coronary artery disease 12/18/2011  . Diabetes mellitus    (CMS-HCC)    unclear age that he developed, + retinopathy  . Dizziness   . Episodic tension-type headache 04/11/2014  . GERD (gastroesophageal reflux disease)   . HL (hearing loss)   . Hyperlipidemia   . Hypertension   . Joint pain   . Myocardial infarction    (CMS-HCC)   . Primary localized osteoarthrosis, lower leg 11/11/2011  . Stroke (cerebrum)    (CMS-HCC) 02/22/2014  . Thoracic or lumbosacral neuritis or radiculitis 09/17/2011  . Tinnitus   . TMJ dysfunction     PAST SURGICAL HISTORY:  Past Surgical History:  Procedure Laterality Date  . CARDIAC CATHETERIZATION    . CHG X-RAY FOR BILE DUCT ENDOSCOPY  05/28/2021   Procedure: ENDOSCOPIC CATHETERIZATION OF THE BILIARY DUCTAL SYSTEM, RADIOLOGICAL SUPERVISION AND INTERPRETATION;  Surgeon: Olam Earnie Henle, MD;  Location: [facility];  Service: Gastroenterology  . CORONARY ARTERY BYPASS GRAFT    . CORONARY STENT PLACEMENT    . KNEE SURGERY     left  . PR ASCEND AORTA GRAFT INCL VAVLE SUSPENSION N/A 12/24/2013   Procedure: ASCENDING AORTA GRAFT, WITH CARDIOPULMONARY BYPASS, WITH OR WITHOUT VALVE SUSPENSION;  Surgeon: Hosey JAYSON Cong, MD;  Location: MAIN OR [facility];  Service: Cardiothoracic  . PR CABG, ARTERIAL, SINGLE N/A 12/24/2013   Procedure: CORONARY ARTERY BYPASS, USING ARTERIAL GRAFT(S); SINGLE ARTERIAL GRAFT;  Surgeon: Hosey JAYSON Cong, MD;  Location: MAIN OR [facility];  Service: Cardiothoracic  . PR CABG, ARTERY-VEIN, THREE N/A 12/24/2013   Procedure: CORONARY ARTERY BYPASS, USING VENOUS GRAFT(S) AND ARTERIAL GRAFT(S); THREE VENOUS GRAFTS;  Surgeon: Hosey JAYSON Cong, MD;  Location: MAIN OR [facility];  Service: Cardiothoracic  . PR CLOSE MED STERNOTOMY SEP, W/WO DEBRIDE N/A 12/24/2013   Procedure: CLOSURE OF MEDIAN STERNOTOMY SEPARATION W/WO DEBRIDEMENT (SEP PROCEDURE);  Surgeon: Hosey JAYSON Cong, MD;  Location: MAIN OR [facility];  Service: Cardiothoracic  . PR COLONOSCOPY W/BIOPSY SINGLE/MULTIPLE N/A 04/26/2015   Procedure: COLONOSCOPY, FLEXIBLE, PROXIMAL TO SPLENIC FLEXURE; WITH BIOPSY, SINGLE OR MULTIPLE;  Surgeon: Norwood Hose, MD;  Location: GI [facility];  Service: Gastrointestinal  . PR ENDOSCOPY W/VIDEO-ASST VEIN HARVEST,CABG Midline 12/24/2013   Procedure: ENDOSCOPY, SURGICAL, INCLUDING VIDEO-ASSISTED HARVEST OF VEIN(S) FOR CORONARY ARTERY BYPASS PROCEDURE;  Surgeon: Hosey JAYSON Cong, MD;  Location: MAIN OR [facility];  Service: Cardiothoracic  . PR ERCP,SPHINCTEROTOMY N/A 05/28/2021   Procedure: ERCP; W/SPHINCTEROTOMY/PAPILLOTOMY;  Surgeon:  Olam Earnie Henle, MD;  Location: [facility]URES [facility];  Service: Gastroenterology  . PR ERCP,W/REMOVAL STONE,BIL/PANCR DUCTS N/A 05/28/2021   Procedure: ERCP; W/ENDOSCOPIC RETROGRADE REMOVAL OF CALCULUS/CALCULI FROM BILIARY &/OR PANCREATIC DUCTS;  Surgeon: Olam Earnie Henle, MD;  Location: GI [facility]URES [facility];  Service: Gastroenterology  . PR LAP,CHOLECYSTECTOMY N/A 07/12/2021   Procedure: LAPAROSCOPY, SURGICAL; CHOLECYSTECTOMY;  Surgeon: Renella Everette Ruddy, MD;  Location: [facility];  Service: General Surgery    MEDICATIONS:  Current Outpatient  Medications  Medication Sig Dispense Refill  . amiodarone  (PACERONE ) 200 MG tablet Take 1 tablet (200 mg total) by mouth daily. 90 tablet 3  . apixaban  (ELIQUIS ) 5 mg Tab Take 1 tablet (5 mg total) by mouth two (2) times a day. 180 tablet 3  . aspirin  81 MG chewable tablet Chew 1 tablet (81 mg total) daily. 30 tablet 2  . atorvastatin  (LIPITOR ) 80 MG tablet TAKE ONE TABLET BY MOUTH ONCE EVERY EVENING 100 tablet 3  . buPROPion (WELLBUTRIN XL) 150 MG 24 hr tablet Take 1 tablet (150 mg total) by mouth every morning. 30 tablet 2  . cholecalciferol , vitamin D3-125 mcg, 5,000 unit,, 125 mcg (5,000 unit) tablet Take 1 tablet (125 mcg total) by mouth daily.    . ENTRESTO  49-51 mg tablet TAKE ONE TABLET BY MOUTH TWO TIMES A DAY 180 tablet 3  . ezetimibe  (ZETIA ) 10 mg tablet Take 1 tablet (10 mg total) by mouth daily. 90 tablet 3  . finasteride  (PROSCAR ) 5 mg tablet TAKE ONE TABLET BY MOUTH ONCE A DAY 90 tablet 3  . fluticasone  propionate (FLONASE ) 50 mcg/actuation nasal spray 1 spray into each nostril daily. 16 g 1  . insulin  glargine (BASAGLAR , LANTUS ) 100 unit/mL (3 mL) injection pen Inject 0.16 mL (16 Units total) under the skin daily. 15 mL 3  . metoPROLOL  succinate (TOPROL -XL) 50 MG 24 hr tablet TAKE ONE TABLET BY MOUTH NIGHTLY 90 tablet 3  . nitroglycerin  (NITROSTAT ) 0.4 MG SL tablet Place 1 tablet (0.4 mg total) under the tongue every five (5) minutes as needed for chest pain (If chest pain continues after 3 tabs or 15 minutes, head to your closest emergency room). 25 tablet 5  . tamsulosin  (FLOMAX ) 0.4 mg capsule TAKE ONE CAPSULE BY MOUTH ONCE A DAY IN THE EVENING 90 capsule 3  . zinc gluconate 50 mg (7 mg elemental zinc) tablet Take 1 tablet (50 mg total) by mouth daily.     No current facility-administered medications for this visit.    ALLERGIES:  Allergies  Allergen Reactions  . Sulfasalazine Hives  . Altace [Ramipril] Other (See Comments)    Cough with Altace  . Sulfa (Sulfonamide  Antibiotics) Rash    FAMILY HISTORY:  Family History  Problem Relation Age of Onset  . Heart attack Father 63  . Heart disease Father   . Stroke Brother 55  . Heart disease Brother   . Hypertension Brother   . Melanoma Neg Hx   . Basal cell carcinoma Neg Hx   . Squamous cell carcinoma Neg Hx     SOCIAL HISTORY:   reports that he quit smoking about 30 years ago. His smoking use included cigarettes. He started smoking about 57 years ago. He has a 30 pack-year smoking history. He quit smokeless tobacco use about 30 years ago.  His smokeless tobacco use included chew. He reports that he does not currently use alcohol. He reports that he does not currently use drugs.  PHYSICAL EXAM:  GENERAL: Pleasant male in no acute distress.  VITAL SIGNS:   Vitals:   03/02/24 0945  BP: 116/76  Pulse: 61  Temp: 36.6 C (97.9 F)    PULMONARY: Normal work of breathing, no use of accessory muscles ABDOMEN: Soft, non-tender, non-distended. No organomegaly or hernias. PSYCHOLOGIC: Normal affect, normal mood GU: voiding spontaneously   REVIEW OF SYSTEMS: Negative upon 10 system review other than what is mentioned in the HPI.  LAB RESULTS:  Results for orders placed or performed during the hospital encounter of 02/02/24  Influenza/ RSV/COVID PCR   Specimen: Nasopharyngeal Swab  Result Value Ref Range   SARS-CoV-2 PCR Negative Negative   Influenza A Negative Negative   Influenza B Negative Negative   RSV Negative Negative  Respiratory Pathogen Panel  Result Value Ref Range   Adenovirus Not Detected Not Detected   Coronavirus HKU1 Not Detected Not Detected   Coronavirus NL63 Not Detected Not Detected   Coronavirus 229E Not Detected Not Detected   Coronavirus OC43 PCR Not Detected Not Detected   Metapneumovirus Not Detected Not Detected   Rhinovirus/Enterovirus Not Detected Not Detected   Influenza A Not Detected Not Detected   Influenza B Not Detected Not Detected   Parainfluenza 1 Not  Detected Not Detected   Parainfluenza 2 Not Detected Not Detected   Parainfluenza 3 Not Detected Not Detected   Parainfluenza 4 Not Detected Not Detected   RSV Not Detected Not Detected   Chlamydophila (Chlamydia) pneumoniae Not Detected Not Detected   Mycoplasma pneumoniae Not Detected Not Detected   SARS-CoV-2 PCR Not Detected Not Detected  Urine Culture   Specimen: Clean Catch; Urine  Result Value Ref Range   Urine Culture, Comprehensive >100,000 CFU/mL Klebsiella pneumoniae (A)       Susceptibility   Klebsiella pneumoniae - MIC SUSCEPTIBILITY RESULT    Ampicillin  Resistant     Ampicillin + Sulbactam  Intermediate     Cefazolin  Intermediate     Cephalexin *  Susceptible      * For uncomplicated UTI's only.    Ceftazidime  Susceptible     Ceftriaxone  Susceptible     Ciprofloxacin   Susceptible     Gentamicin  Susceptible     Levofloxacin  Susceptible     Nitrofurantoin  Resistant     Piperacillin + Tazobactam  Susceptible-Dose Dependent     Tetracycline*  Resistant      * Organisms that test susceptible to tetracycline are considered susceptible to doxycycline.However, some organisms that test intermediate or resistant to tetracycline may be susceptible to doxycycline.    Tobramycin  Susceptible     Trimethoprim + Sulfamethoxazole  Susceptible   Blood Culture   Specimen: 1 Peripheral Draw; Blood  Result Value Ref Range   Blood Culture, Routine No Growth at 5 days   Blood Culture   Specimen: 1 Peripheral Draw; Blood  Result Value Ref Range   Blood Culture, Routine No Growth at 5 days   Comprehensive metabolic panel  Result Value Ref Range   Sodium 145 135 - 145 mmol/L   Potassium 4.1 3.4 - 4.8 mmol/L   Chloride 105 98 - 107 mmol/L   CO2 19.5 (L) 20.0 - 31.0 mmol/L   Anion Gap 21 (H) 5 - 14 mmol/L   BUN 36 (H) 9 - 23 mg/dL   Creatinine 7.18 (H) 9.26 - 1.18 mg/dL   BUN/Creatinine Ratio 13    eGFR CKD-EPI (2021) Male 23 (L) >=60 mL/min/1.45m2   Glucose 171 70 -  179  mg/dL   Calcium  9.7 8.7 - 10.4 mg/dL   Albumin 4.1 3.4 - 5.0 g/dL   Total Protein 6.8 5.7 - 8.2 g/dL   Total Bilirubin 1.2 0.3 - 1.2 mg/dL   AST 18 <=65 U/L   ALT 23 10 - 49 U/L   Alkaline Phosphatase 105 46 - 116 U/L  hsTroponin I (single, no delta)  Result Value Ref Range   hsTroponin I 16 <=53 ng/L  Lactate Sepsis, Venous  Result Value Ref Range   Lactate, Venous 1.3 0.5 - 1.8 mmol/L  Basic metabolic panel  Result Value Ref Range   Sodium 145 135 - 145 mmol/L   Potassium 4.1 3.5 - 5.1 mmol/L   Chloride 107 98 - 107 mmol/L   CO2 22.6 20.0 - 31.0 mmol/L   Anion Gap 15 (H) 5 - 14 mmol/L   BUN 43 (H) 9 - 23 mg/dL   Creatinine 7.17 (H) 9.26 - 1.18 mg/dL   BUN/Creatinine Ratio 15    eGFR CKD-EPI (2021) Male 23 (L) >=60 mL/min/1.75m2   Glucose 216 (H) 70 - 179 mg/dL   Calcium  9.3 8.7 - 10.4 mg/dL  CBC  Result Value Ref Range   WBC 6.7 3.6 - 11.2 10*9/L   RBC 5.07 4.26 - 5.60 10*12/L   HGB 14.8 12.9 - 16.5 g/dL   HCT 56.5 60.9 - 51.9 %   MCV 85.6 77.6 - 95.7 fL   MCH 29.2 25.9 - 32.4 pg   MCHC 34.1 32.0 - 36.0 g/dL   RDW 85.1 87.7 - 84.7 %   MPV 8.7 6.8 - 10.7 fL   Platelet 97 (L) 150 - 450 10*9/L  Magnesium Level  Result Value Ref Range   Magnesium 1.9 1.6 - 2.6 mg/dL  Blood Gas, Venous  Result Value Ref Range   Specimen Source Venous    FIO2 Venous Not Specified    pH, Venous 7.33 7.32 - 7.43   pCO2, Ven 46 40 - 60 mm Hg   pO2, Ven 31 (L) 35 - 40 mm Hg   HCO3, Ven 22 22 - 27 mmol/L   Base Excess, Ven -1.9 -2.0 - 2.0   O2 Saturation, Venous 53.4 40.0 - 85.0 %  Pro-BNP  Result Value Ref Range   PRO-BNP 1,909.0 (H) <=300.0 pg/mL  Beta Hydroxybutyrate  Result Value Ref Range   BETAHYDRO 0.08 0.02 - 0.27 mmol/L  Magnesium Level  Result Value Ref Range   Magnesium 2.1 1.6 - 2.6 mg/dL  Phosphorus Level  Result Value Ref Range   Phosphorus 3.8 2.4 - 5.1 mg/dL  Phosphorus Level  Result Value Ref Range   Phosphorus 3.4 2.4 - 5.1 mg/dL  Phosphorus Level  Result  Value Ref Range   Phosphorus 3.4 2.4 - 5.1 mg/dL  Basic metabolic panel  Result Value Ref Range   Sodium 146 (H) 135 - 145 mmol/L   Potassium 4.0 3.4 - 4.8 mmol/L   Chloride 107 98 - 107 mmol/L   CO2 23.1 20.0 - 31.0 mmol/L   Anion Gap 16 (H) 5 - 14 mmol/L   BUN 32 (H) 9 - 23 mg/dL   Creatinine 7.34 (H) 9.26 - 1.18 mg/dL   BUN/Creatinine Ratio 12    eGFR CKD-EPI (2021) Male 24 (L) >=60 mL/min/1.73m2   Glucose 170 70 - 179 mg/dL   Calcium  9.3 8.7 - 10.4 mg/dL  CBC  Result Value Ref Range   WBC 6.1 3.6 - 11.2 10*9/L   RBC 5.10 4.26 - 5.60  10*12/L   HGB 14.8 12.9 - 16.5 g/dL   HCT 56.6 60.9 - 51.9 %   MCV 84.8 77.6 - 95.7 fL   MCH 28.9 25.9 - 32.4 pg   MCHC 34.1 32.0 - 36.0 g/dL   RDW 85.4 87.7 - 84.7 %   MPV 8.9 6.8 - 10.7 fL   Platelet 87 (L) 150 - 450 10*9/L  Magnesium Level  Result Value Ref Range   Magnesium 1.9 1.6 - 2.6 mg/dL  Hepatic Function Panel  Result Value Ref Range   Albumin 3.5 3.4 - 5.0 g/dL   Total Protein 6.2 5.7 - 8.2 g/dL   Total Bilirubin 0.6 0.3 - 1.2 mg/dL   Bilirubin, Direct 9.79 0.00 - 0.30 mg/dL   AST 13 <=65 U/L   ALT 17 10 - 49 U/L   Alkaline Phosphatase 89 46 - 116 U/L  Pathologist Smear Review  Result Value Ref Range   Pathologist Smear Interpretation  Confirmed by Hemepath Fellow, Confirmed by Hemepath Specialist/Senior Tech, Confirmed by Core Specialist/Senior Tech, To Be Accessioned - Case Report to Follow, Physician Requested Path Review - BF/CSF: cytospin routed to hemepath, Physician Requested...    Physician Requested Path Review - PB:  prepared slide and instrument printout routed to hemepath  TSH  Result Value Ref Range   TSH 1.991 0.550 - 4.780 uIU/mL  Lipid Panel  Result Value Ref Range   Cholesterol, Total 106 <200 mg/dL   Cholesterol, HDL 29 (L) >40 mg/dL   Cholesterol, LDL, Calculated 58 <100 mg/dL   Cholesterol, Non-HDL, Calculated 77 <130 mg/dL   Triglycerides 864 <849 mg/dL   Fasting    Hemoglobin A1c  Result Value  Ref Range   Hemoglobin A1C 7.8 (H) 4.8 - 5.6 %   Estimated Average Glucose 177 mg/dL  Basic metabolic panel  Result Value Ref Range   Sodium 144 135 - 145 mmol/L   Potassium 3.9 3.4 - 4.8 mmol/L   Chloride 104 98 - 107 mmol/L   CO2 26.0 20.0 - 31.0 mmol/L   Anion Gap 14 5 - 14 mmol/L   BUN 22 9 - 23 mg/dL   Creatinine 7.58 (H) 9.26 - 1.18 mg/dL   BUN/Creatinine Ratio 9    eGFR CKD-EPI (2021) Male 27 (L) >=60 mL/min/1.85m2   Glucose 191 (H) 70 - 179 mg/dL   Calcium  9.5 8.7 - 10.4 mg/dL  CBC  Result Value Ref Range   WBC 5.7 3.6 - 11.2 10*9/L   RBC 5.64 (H) 4.26 - 5.60 10*12/L   HGB 16.1 12.9 - 16.5 g/dL   HCT 52.2 60.9 - 51.9 %   MCV 84.6 77.6 - 95.7 fL   MCH 28.6 25.9 - 32.4 pg   MCHC 33.8 32.0 - 36.0 g/dL   RDW 85.5 87.7 - 84.7 %   MPV 8.7 6.8 - 10.7 fL   Platelet 99 (L) 150 - 450 10*9/L  Magnesium Level  Result Value Ref Range   Magnesium 2.0 1.6 - 2.6 mg/dL  Phosphorus Level  Result Value Ref Range   Phosphorus 3.1 2.4 - 5.1 mg/dL  Basic metabolic panel  Result Value Ref Range   Sodium 144 135 - 145 mmol/L   Potassium 3.6 3.4 - 4.8 mmol/L   Chloride 105 98 - 107 mmol/L   CO2 24.0 20.0 - 31.0 mmol/L   Anion Gap 15 (H) 5 - 14 mmol/L   BUN 25 (H) 9 - 23 mg/dL   Creatinine 7.57 (H) 9.26 - 1.18 mg/dL   BUN/Creatinine  Ratio 10    eGFR CKD-EPI (2021) Male 27 (L) >=60 mL/min/1.15m2   Glucose 178 70 - 179 mg/dL   Calcium  9.5 8.7 - 10.4 mg/dL  CBC  Result Value Ref Range   WBC 4.8 3.6 - 11.2 10*9/L   RBC 5.33 4.26 - 5.60 10*12/L   HGB 15.6 12.9 - 16.5 g/dL   HCT 54.8 60.9 - 51.9 %   MCV 84.6 77.6 - 95.7 fL   MCH 29.2 25.9 - 32.4 pg   MCHC 34.5 32.0 - 36.0 g/dL   RDW 85.5 87.7 - 84.7 %   MPV 8.7 6.8 - 10.7 fL   Platelet 78 (L) 150 - 450 10*9/L  Magnesium Level  Result Value Ref Range   Magnesium 2.0 1.6 - 2.6 mg/dL  Phosphorus Level  Result Value Ref Range   Phosphorus 3.0 2.4 - 5.1 mg/dL  Basic metabolic panel  Result Value Ref Range   Sodium 145 135 - 145  mmol/L   Potassium 3.6 3.4 - 4.8 mmol/L   Chloride 106 98 - 107 mmol/L   CO2 24.0 20.0 - 31.0 mmol/L   Anion Gap 15 (H) 5 - 14 mmol/L   BUN 23 9 - 23 mg/dL   Creatinine 7.37 (H) 9.26 - 1.18 mg/dL   BUN/Creatinine Ratio 9    eGFR CKD-EPI (2021) Male 25 (L) >=60 mL/min/1.24m2   Glucose 188 (H) 70 - 179 mg/dL   Calcium  9.7 8.7 - 10.4 mg/dL  CBC  Result Value Ref Range   WBC 5.7 3.6 - 11.2 10*9/L   RBC 5.32 4.26 - 5.60 10*12/L   HGB 15.5 12.9 - 16.5 g/dL   HCT 54.7 60.9 - 51.9 %   MCV 85.0 77.6 - 95.7 fL   MCH 29.2 25.9 - 32.4 pg   MCHC 34.4 32.0 - 36.0 g/dL   RDW 85.3 87.7 - 84.7 %   MPV 8.8 6.8 - 10.7 fL   Platelet 85 (L) 150 - 450 10*9/L  Magnesium Level  Result Value Ref Range   Magnesium 2.0 1.6 - 2.6 mg/dL  Phosphorus Level  Result Value Ref Range   Phosphorus 3.7 2.4 - 5.1 mg/dL  LUPUS INHIBITOR PANEL  Result Value Ref Range   Dilute Viper Venom Time 50.1 (H) 21.9 - 44.5 s   Dilute Viper Venom Confirm 47.5 s   DRVVT Normalized Ratio 1.05 0.00 - 1.20   APTT Lupus Anticoagulant 39.3 0.0 - 45.2 s   Anti-PL Interp    Beta-2 Glycoprotein Antibodies  Result Value Ref Range   Beta-2 Glyco 1 IgG 1.0 <20.0   Beta-2 Glyco 1 IgM <1.0 <20.0  Cardiolipin Antibody, IgM/IgG  Result Value Ref Range   Anticardiolipin IgG 6.0 0.0 - 23.0 [GPL'U]   Anticardiolipin IgM 3.0 0.0 - 11.0 [MPL'U]  ECG 12 Lead  Result Value Ref Range   EKG Systolic BP  mmHg   EKG Diastolic BP  mmHg   EKG Ventricular Rate 94 BPM   EKG Atrial Rate 94 BPM   EKG P-R Interval 188 ms   EKG QRS Duration 124 ms   EKG Q-T Interval 390 ms   EKG QTC Calculation 487 ms   EKG Calculated P Axis 29 degrees   EKG Calculated R Axis 64 degrees   EKG Calculated T Axis 257 degrees   QTC Fredericia 453 ms  ECG 12 lead  Result Value Ref Range   EKG Systolic BP  mmHg   EKG Diastolic BP  mmHg   EKG Ventricular Rate  60 BPM   EKG Atrial Rate 60 BPM   EKG P-R Interval 214 ms   EKG QRS Duration 126 ms   EKG Q-T  Interval 468 ms   EKG QTC Calculation 468 ms   EKG Calculated P Axis 2 degrees   EKG Calculated R Axis -17 degrees   EKG Calculated T Axis 139 degrees   QTC Fredericia 468 ms  Hematopathology Order  Result Value Ref Range   Diagnosis      Peripheral blood, smear review -  Thrombocytopenia without platelet clumping (see Comment) -  No significant morphologic abnormality  This electronic signature is attestation that the pathologist personally reviewed the submitted material(s) and the final diagnosis reflects that evaluation.    Diagnosis Comment      Etiologic considerations for the thrombocytopenia include autoimmune diseases such as ITP, splenomegaly, toxins (including alcohol), medications, viral infections, and primary bone marrow disorders.  Clinical correlation is advised.    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 Pathologist's review of the peripheral blood smear is requested due to thrombocytopaenia.    Gross Description      Received: EDTA tube of peripheral blood with a request for Pathologist review of blood smear. A blood smear is prepared and stained for review. Flow cytometry is not performed.      Microscopic Description      Microscopic examination substantiates the above diagnosis.  Laboratory Data:   [facility] lab results.  WBC:     6.0 HGB:     14.8 PLTS:    81 RBC:     5.03 MCV:     85.2 MCHC:  34.6 RDW:    14.4 NEUT:   3.9 (65.8%) LYMPH: 1.3 (21.4%) MONO:  0.5 (9.0%) EOS:     0.2 (3.2%) BASO:   0.0 (0.6%)  Peripheral Blood: Platelets: Decreased in number with normal platelet morphology; platelet clumps and fibrin strands are not appreciated Erythroid: Unremarkable  Leukocytes:  Unremarkable ; negative for blasts, overtly dysplastic granulocytes, morphologically abnormal lymphoid cells, and circulating plasma cells    Resident Physician: None Assigned    EMBEDDED IMAGES     Disclaimer      Unless otherwise specified, specimens are preserved using 10% neutral buffered formalin. For cases in which immunohistochemical and/or in-situ hybridization stains are performed, the following statement applies: Appropriate controls for each stain (positive controls with or without negative controls) have been evaluated and stain as expected. These stains have not been separately validated for use on decalcified specimens and should be interpreted with caution in that setting. Some of the reagents used for these stains may be classified as analyte specific reagents (ASR). Tests using ASRs were developed, and their performance characteristics were determined, by the Anatomic Pathology Department [facility]). They have not been cleared or approved by the US  Food and Drug Administration (FDA). The FDA does not require these tests to go through premarket FDA review. These tests are used for clinical purposes. They should not be regarded as investigational or for research. This laboratory is certified under the Clinical Laboratory Improvement Amendments (CLIA) as qualified to perform high complexity clinical laboratory testing.   POCT Glucose  Result Value Ref Range   Glucose, POC  205 (H) 70 - 179 mg/dL  POCT Glucose  Result Value Ref Range   Glucose, POC 160 70 - 179 mg/dL  POCT Glucose  Result Value Ref Range   Glucose, POC 239 (H) 70 - 179 mg/dL  POCT Glucose  Result Value Ref Range   Glucose, POC 292 (H) 70 - 179 mg/dL  POCT Glucose  Result Value Ref Range   Glucose, POC 173 70 - 179 mg/dL  POCT Glucose  Result Value Ref Range   Glucose, POC 158 70 - 179 mg/dL  POCT Glucose  Result Value Ref Range   Glucose, POC 168 70 - 179 mg/dL  POCT Glucose   Result Value Ref Range   Glucose, POC 148 70 - 179 mg/dL  POCT Glucose  Result Value Ref Range   Glucose, POC 179 70 - 179 mg/dL  POCT Glucose  Result Value Ref Range   Glucose, POC 142 70 - 179 mg/dL  POCT Glucose  Result Value Ref Range   Glucose, POC 218 (H) 70 - 179 mg/dL  POCT Glucose  Result Value Ref Range   Glucose, POC 185 (H) 70 - 179 mg/dL  POCT Glucose  Result Value Ref Range   Glucose, POC 183 (H) 70 - 179 mg/dL  POCT Glucose  Result Value Ref Range   Glucose, POC 159 70 - 179 mg/dL  POCT Glucose  Result Value Ref Range   Glucose, POC 173 70 - 179 mg/dL  POCT Glucose  Result Value Ref Range   Glucose, POC 165 70 - 179 mg/dL  POCT Glucose  Result Value Ref Range   Glucose, POC 165 70 - 179 mg/dL  POCT Glucose  Result Value Ref Range   Glucose, POC 219 (H) 70 - 179 mg/dL  POCT Glucose  Result Value Ref Range   Glucose, POC 180 (H) 70 - 179 mg/dL  POCT Glucose  Result Value Ref Range   Glucose, POC 256 (H) 70 - 179 mg/dL  POCT Glucose  Result Value Ref Range   Glucose, POC 211 (H) 70 - 179 mg/dL  POCT Glucose  Result Value Ref Range   Glucose, POC 220 (H) 70 - 179 mg/dL  POCT Glucose  Result Value Ref Range   Glucose, POC 164 70 - 179 mg/dL  POCT Glucose  Result Value Ref Range   Glucose, POC 190 (H) 70 - 179 mg/dL  GREEN LITHIUM HEPARIN  EXTRA TUBE  Result Value Ref Range   Extra Tube Green Lithium Heparin     Urinalysis with Microscopy with Culture Reflex  Result Value Ref Range   Color, UA Yellow    Clarity, UA Turbid    Specific Gravity, UA 1.015 1.003 - 1.030   pH, UA 5.5 5.0 - 9.0   Leukocyte Esterase, UA Large (A) Negative   Nitrite, UA Negative Negative   Protein, UA 100 mg/dL (A) Negative   Glucose, UA Negative Negative   Ketones, UA Negative Negative   Urobilinogen, UA <2.0 mg/dL <7.9 mg/dL   Bilirubin, UA Negative Negative   Blood, UA Small (A) Negative   RBC, UA 11 (H) <=3 /HPF   WBC, UA >182 (H) <=2 /HPF   Squam Epithel, UA 8  (H) 0 - 5 /HPF   Bacteria, UA Occasional (A) None Seen /HPF   WBC Clumps Few (A) None Seen /HPF   Mucus, UA Rare (A) None Seen /HPF  CBC w/ Differential  Result Value Ref Range   WBC 7.0 3.6 - 11.2 10*9/L   RBC  5.50 4.26 - 5.60 10*12/L   HGB 16.1 12.9 - 16.5 g/dL   HCT 53.6 60.9 - 51.9 %   MCV 84.2 77.6 - 95.7 fL   MCH 29.3 25.9 - 32.4 pg   MCHC 34.8 32.0 - 36.0 g/dL   RDW 85.2 87.7 - 84.7 %   MPV 9.0 6.8 - 10.7 fL   Platelet 106 (L) 150 - 450 10*9/L   nRBC 0 <=4 /100 WBCs   Neutrophils % 67.2 %   Lymphocytes % 22.6 %   Monocytes % 7.4 %   Eosinophils % 2.1 %   Basophils % 0.7 %   Absolute Neutrophils 4.7 1.8 - 7.8 10*9/L   Absolute Lymphocytes 1.6 1.1 - 3.6 10*9/L   Absolute Monocytes 0.5 0.3 - 0.8 10*9/L   Absolute Eosinophils 0.1 0.0 - 0.5 10*9/L   Absolute Basophils 0.0 0.0 - 0.1 10*9/L  JAK2 V617F, Quant Mutation Analysis  Result Value Ref Range   Specimen Type Blood    JAK2 V617F, QUANT Negative    JAK2 V617F, Quant Mutation Analysis      RESULTS: Negative for JAK2 c.1849G>T (p.V617F) mutation  INTERPRETATION: This negative result does not exclude the presence of a clonal myeloid neoplasm. (See Comment).  COMMENT: If there is high clinical suspicion for a myeloproliferative neoplasm, additional mutations are testable using the Myeloproliferative neoplasm (MPN) hotspot panel (JAK2, CALR, MPL), which can be requested on this sample.  REFERENCES: Occupational Hygienist (NCCN) Guidelines: Myeloproliferative Neoplasms. http://www.nccn.org Vainchenker W and Kravolics R.  Blood. 2017. 129 (6): C9548732. PMID: 71971970   Rodolph HERO, et al., Annals of Hematology. 2016. 95 (5): 739-744. PMID: 73068886 Sander BROCKS, et al., Haematologica. 2014. 99 (9): 1448-1455. PMID: 75092643  METHOD: Droplet Digital PCR for JAK2 c.1849G>T (p.V617F) was performed using [employer] reagents and a QX200 droplet reader. Results are reported both qualitatively and quantitatively with  a limit of detection of 0.1% variant allele fraction. The reference range for this test is: Negative for JAK2 c.1849G>T (p.V617F) mutation.  This test was developed and its performance characteristics determined by the [facility]. It has not been approved by the US  Food and Drug Administration. However, such approval is not required for clinical implementation, and test results have been shown to be clinically useful. This laboratory is CAP accredited and CLIA certified to perform high complexity testing.      *Note: Due to a large number of results and/or encounters for the requested time period, some results have not been displayed. A complete set of results can be found in Results Review.   Imaging:  CT abdomen pelvis without contrast Status: Final result   PACS Images   Show images for CT abdomen pelvis without contrast <redacted file path>   Impression  1.  No acute abnormality within the abdomen or pelvis, within the limitation of noncontrast technique.  2.  Moderately distended bladder with intraluminal air. Correlate for recent instrumentation.     Narrative  EXAM: CT ABDOMEN PELVIS WO CONTRAST  ACCESSION: [id number]  REPORT DATE: 02/02/2024 4:20 AM  CLINICAL INDICATION: 73 years old with Acute peri - umb abd pain      COMPARISON: CT abdomen pelvis 03/24/2023, 10/19/2021    TECHNIQUE: A spiral CT scan was obtained without IV contrast from the lung bases to the pubic symphysis.  Images were reconstructed in the axial plane. Coronal and sagittal reformatted images were also provided for further evaluation.    Evaluation of the solid organs and vasculature is  limited in the absence of intravenous contrast.      FINDINGS:    LOWER CHEST: Dependent subsegmental atelectasis in the right lower lobe. No pleural effusion. Multivessel coronary artery calcifications. Aortic valve replacement. Unchanged calcification along the periphery of the right atrium.     LIVER: Normal liver contour. No focal liver lesion on non-contrast examination.    BILIARY: The gallbladder is surgically absent. No intrahepatic biliary ductal dilatation.    SPLEEN: Normal in size and contour.    PANCREAS: Normal pancreatic contour without signs of inflammation or gross ductal dilatation.    ADRENAL GLANDS: Normal appearance of the adrenal glands.    KIDNEYS/URETERS: Lobulated renal contours bilaterally. Nonobstructing 4 mm left lower pole calculus. Renal vascular calcifications. No hydronephrosis. Simple right renal cyst.    BLADDER: Moderately distended urinary bladder with nondependent air which may be related to recent instrumentation.    REPRODUCTIVE ORGANS: Prostatomegaly.    GI TRACT: Small sliding-type hiatal hernia. No findings of bowel obstruction. Large D3 duodenal diverticulum. Normal appendix.    PERITONEUM/RETROPERITONEUM AND MESENTERY: No free air. No ascites. No fluid collection.    VASCULATURE: Normal caliber aorta. Otherwise, limited evaluation without contrast. Extensive calcified plaque of the aorta and branch vessels.    LYMPH NODES: No adenopathy.    BONES and SOFT TISSUES: No aggressive osseous lesions. Small bilateral fat-containing inguinal hernias. Tiny fat-containing umbilical hernia. Multilevel degenerative changes of the lumbar spine with trace stepwise retrolisthesis from L3-L5.

## 2024-03-10 NOTE — Progress Notes (Signed)
 [facility] [address] [place], [place]  [address] P [phone] F [phone]   Primary care provider:  Lamona Wanda Bumpers, MD  Last ID encounter:   Not applicable   Assessment/Plan:    Shane Holloway is being seen for evaluation and management of recurrent UTIs.  Assessment #Pharyngitis Notes scratchy throat for a day (several days per family). Not vaccinated for COVID or flu, and declines COVID vaccines.  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 Has had recurrent UTIs with Klebsiella, suggesting colonization of kidney stones.  Plan COVID/flu/RSV test (call niece and caregiver Shane Holloway [phone]) Encouraged to get RSV, COVID, influenza vaccines pending results of today's test Encouraged to stay well-hydrated, ensure complete urinary voiding, and avoid constipation Follow up with urology for management of nephrolithiasis, BPH, and possible bladder diverticulum If recurrent fevers, suprapubic pain, increased urinary frequency, or other symptoms of UTI, would suggest repeat UA and UCx, with treatment based on culture results If experiencing back/flank pain, confusion, or other severe signs of infection, advised to present to the ED Continue cephalexin  250 mg PO daily for suppression per urology   Orders Placed This Encounter  Procedures  . RAPID INFLUENZA/RSV/COVID PCR    Disposition Return if symptoms worsen or fail to improve.   I personally spent 35 minutes face-to-face and non-face-to-face in the care of this patient, which includes all pre, intra, and post visit time on the date of service.  All documented time was specific to the E/M visit and does not include any procedures that may have been performed.  This note and its recommendations will be communicated to the patient's care team upon completion of this  encounter.  Shane JULIANNA Ditch, MD Division of Infectious Diseases       Subjective     History of Present Illness This is my first time seeing the patient.  All records available to me were reviewed in detail and are summarized below, in the HPI.   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  He reports numerous episodes of UTI in the past year.. Prior suppression? Notably, underwent local cystourethroscopy with Shane Bars, PA-C on 10/13/23 which revealed BPH with outlet obstruction and abnormality at bladder dome: large bubble at the dome... difficult to assess if large bladder diverticulum vs urachal remnant vs bladder irregularity. He was seen by urology on 03/02/24, and continued on Flomax  and finasteride . Deferred cystoscopy and TRUS at that time, but started on suppressive cephalexin .  Today, he reports that he is doing well, with no subjective fevers, chills, or night sweats. He feels that he is tolerating the antibiotic therapy well, with no nausea, vomiting, diarrhea, or rash. Also starting probiotic.  Woke up with a scratchy throat, shortness of breath, fatigue. Accompanied by caregiver.   Past Medical History[1]    Social History Background - limited, patient with dementia  Housing - in house with friend [name], has caregiver twice a week. School / Work Lobbyist - retired  Tobacco - former  Alcohol - never drinks alcohol Substance use - none   Medications Current Outpatient Medications  Medication Instructions  . amiodarone  (PACERONE ) 200 mg, Oral, Daily (standard)  .  apixaban  (ELIQUIS ) 5 mg, Oral, 2 times a day (standard)  . aspirin  81 mg, Oral, Daily (standard)  . atorvastatin  (LIPITOR ) 80 MG tablet TAKE ONE TABLET  BY MOUTH ONCE EVERY EVENING  . buPROPion (WELLBUTRIN XL) 150 mg, Oral, Every morning  . cephalexin  (KEFLEX ) 250 mg, Oral, Daily  . cholecalciferol  (vitamin D3-125 mcg (5,000 unit)) 125 mcg, Daily (standard)  . ENTRESTO  49-51 mg tablet 1 tablet, Oral  . ezetimibe  (ZETIA ) 10 mg, Oral, Daily (standard)  . finasteride  (PROSCAR ) 5 mg, Oral, Daily (standard)  . fluticasone  propionate (FLONASE ) 50 mcg/actuation nasal spray 1 spray, Each Nare, Daily (standard)  . insulin  glargine (BASAGLAR , LANTUS ) 16 Units, Subcutaneous, Daily (standard)  . metoPROLOL  succinate (TOPROL -XL) 50 mg, Oral, Nightly  . nitroglycerin  (NITROSTAT ) 0.4 mg, Sublingual, Every 5 min PRN  . tamsulosin  (FLOMAX ) 0.4 mg, Oral, Daily (standard)  . zinc gluconate 50 mg, Daily (standard)      Antimicrobials Current Cephalexin  (suppression)  Prior Cefepime Vancomycin  Allergies  Sulfasalazine, Altace [ramipril], and Sulfa (sulfonamide antibiotics)   Family History His family history includes Heart attack (age of onset: 71) in his father; Heart disease in his brother and father; Hypertension in his brother; Stroke (age of onset: 71) in his brother.   Immunizations Immunization History  Administered Date(s) Administered  . Covid-19 Vac, (45yr+) (Spikevax) Monovalent Moderna 05/21/2022  . INFLUENZA QUAD ADJUVANTED 24YR UP(FLUAD) 02/20/2021, 05/21/2022  . INFLUENZA TIV (TRI) PF (IM)(HISTORICAL) 05/18/2012  . Influenza Vaccine Quad(IM)6 MO-Adult(PF) 03/02/2013, 02/28/2014, 03/21/2015, 02/22/2016, 04/18/2017, 02/25/2018, 03/30/2019, 08/11/2020  . PNEUMOCOCCAL POLYSACCHARIDE 23-VALENT 11/16/2009, 02/17/2012, 04/18/2017, 05/24/2019  . Pneumococcal Conjugate 13-Valent 02/28/2014  . TdaP 07/02/2013          Objective    BP 92/54 (BP Site: R Arm, BP Position: Sitting, BP Cuff Size: Large)   Pulse 57   Temp 36.5 C (97.7 F) (Temporal)   Ht 188 cm (6' 2)   BMI 28.50 kg/m    Ideal body weight: 82.2 kg (181 lb 3.5  oz) Adjusted ideal body weight: 89.6 kg (197 lb 8.5 oz)   Const [x]  vital signs above     [x]  WDWN, NAD, non-toxic appearance []      Eyes [x]  Lids normal bilaterally, conjunctiva anicteric and noninjected OU      []  PERRL  []      ENMT [x]  Normal appearance of external nose and ears      []  OP clear   [x]  MMM, no lesions on lips or gums, dentition good       []  Hearing normal  [x]  Mild erythema of throat    Neck [x]  Neck of normal appearance and trachea midline       []  No thyromegaly, nodules, or tenderness  []      Lymph []  No LAD in neck      []  No LAD in supraclavicular area      []  No LAD in axillae  []  No LAD in epitrochlear chains      []  No LAD in inguinal areas [x]  Tender L submental LAD    CV [x]  RRR, no m/r/g, S1/S2      []  No peripheral edema, WWP      []  Pedal pulses intact  []      Resp [x]  Normal WOB      [x]  CTAB  []      GI [x]  Normal inspection, NTND, NABS      []  No umbilical hernia on exam      []  No hepatosplenomegaly      []   Inspection of perineal and perianal areas normal []      GU []  Normal external genitalia      [x] No urinary catheter present in urethra  [x]  No CVA tenderness    MSK []  No clubbing or cyanosis of hands      []  No focal tenderness or abnormalities on palpation of joints in RUE, LUE, RLE, or LLE []      Skin [x]  No rashes, lesions, or ulcers of visualized skin      [x]  Skin warm and dry to palpation  []      Neuro []  CNs II-XII grossly intact      []  Sensation to light touch grossly intact throughout  []  DTRs normal and symmetric throughout  []  Unable to assess due to critical illness, sedation, or mental status []      Psych [x]  Appropriate affect     []  Oriented to person, place, time     [x]  Judgment and insight are appropriate  []  Unable to assess due to critical illness, sedation, or mental status []         DATA REVIEWED   Pathology: Diagnosis  Date Value Ref Range Status  02/03/2024   Final   Peripheral  blood, smear review -  Thrombocytopenia without platelet clumping (see Comment) -  No significant morphologic abnormality  This electronic signature is attestation that the pathologist personally reviewed the submitted material(s) and the final diagnosis reflects that evaluation.      Labs: Lab Results  Component Value Date   WBC 5.7 02/06/2024   NEUTROABS 4.7 02/01/2024   LYMPHSABS 1.6 02/01/2024   EOSABS 0.1 02/01/2024   HGB 15.5 02/06/2024   HCT 45.2 02/06/2024   PLT 85 (L) 02/06/2024    Lab Results  Component Value Date   NA 145 02/06/2024   K 3.6 02/06/2024   CL 106 02/06/2024   CO2 24.0 02/06/2024   BUN 23 02/06/2024   CREATININE 2.62 (H) 02/06/2024   CALCIUM  9.7 02/06/2024   MG 2.0 02/06/2024   PHOS 3.7 02/06/2024    Lab Results  Component Value Date   ALKPHOS 89 02/03/2024   BILITOT 0.6 02/03/2024   BILIDIR 0.20 02/03/2024   PROT 6.2 02/03/2024   ALBUMIN 3.5 02/03/2024   ALT 17 02/03/2024   AST 13 02/03/2024   GGT 37 07/09/2021    Lab Results  Component Value Date   ESR 14 01/29/2019   ESR 8 02/21/2014   CRP 54.5 (H) 05/24/2019   CRP 56.6 (H) 05/23/2019    Serologies: Lab Results  Component Value Date   Hepatitis C Ab Nonreactive 05/25/2021    Microbiology:   Lab Results  Component Value Date   LABBLOO No Growth at 5 days 02/02/2024   LABBLOO No Growth at 5 days 02/02/2024   LABBLOO No Growth at 5 days 08/20/2019   LABBLOO No Growth at 5 days 08/20/2019   LABURIN >100,000 CFU/mL Klebsiella pneumoniae (A) 02/01/2024   LABURIN >100,000 CFU/mL Klebsiella pneumoniae (A) 09/01/2023   LABURIN >100,000 CFU/mL Klebsiella pneumoniae (A) 05/20/2023   LABURIN >100,000 CFU/mL Klebsiella pneumoniae (A) 05/05/2023   LABURIN Mixed Urogenital Flora 10/19/2021   LABURIN Mixed Urogenital Flora 07/08/2021   02/01/24 UCx >100K CFU Klebsiella pneumoniae 09/01/23 UCx >100K CFU Klebsiella pneumoniae 05/20/23 UCx >100K CFU Klebsiella pneumoniae 04/2523 UCx  >100K CFU Klebsiella pneumoniae  Radiology: EXAM: CT ABDOMEN PELVIS WO CONTRAST REPORT DATE: 02/02/2024  1.  No acute abnormality within the abdomen or pelvis, within the limitation of noncontrast technique. 2.  Moderately distended bladder with intraluminal air. Correlate for recent instrumentation.  EXAM: US  RENAL COMPLETE REPORT DATE: 06/06/2023 1.  No hydronephrosis. 2.  Multiple nonobstructive renal calculi measuring up to 0.6 cm bilaterally. 3.  Mildly enlarged prostate.         [1] Past Medical History: Diagnosis Date  . Acute renal failure superimposed on stage 3 chronic kidney disease (CMS-HCC) 04/20/2017  . BPH (benign prostatic hyperplasia)   . CHF (congestive heart failure) (CMS-HCC)   . Coronary artery disease 12/18/2011  . Diabetes mellitus (CMS-HCC)    unclear age that he developed, + retinopathy  . Dizziness   . Episodic tension-type headache 04/11/2014  . GERD (gastroesophageal reflux disease)   . HL (hearing loss)   . Hyperlipidemia   . Hypertension   . Joint pain   . Myocardial infarction (CMS-HCC)   . Primary localized osteoarthrosis, lower leg 11/11/2011  . 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 Thoracic or lumbosacral neuritis or radiculitis 09/17/2011  . Tinnitus   . TMJ dysfunction

## 2024-03-11 NOTE — Progress Notes (Signed)
COVID flu RSV negative.

## 2024-04-12 ENCOUNTER — Emergency Department
Admission: EM | Admit: 2024-04-12 | Discharge: 2024-04-12 | Disposition: A | Discharge status: Home / Self Care | Attending: Emergency Medicine | Admitting: Emergency Medicine

## 2024-04-12 ENCOUNTER — Other Ambulatory Visit: Payer: Self-pay

## 2024-04-12 ENCOUNTER — Encounter: Payer: Self-pay | Admitting: Emergency Medicine

## 2024-04-12 DIAGNOSIS — R319 Hematuria, unspecified: Secondary | ICD-10-CM | POA: Diagnosis present

## 2024-04-12 DIAGNOSIS — N390 Urinary tract infection, site not specified: Secondary | ICD-10-CM | POA: Insufficient documentation

## 2024-04-12 LAB — BASIC METABOLIC PANEL WITH GFR
Anion gap: 11 (ref 5–15)
BUN: 56 mg/dL — ABNORMAL HIGH (ref 8–23)
CO2: 23 mmol/L (ref 22–32)
Calcium: 9.1 mg/dL (ref 8.9–10.3)
Chloride: 105 mmol/L (ref 98–111)
Creatinine, Ser: 3.38 mg/dL — ABNORMAL HIGH (ref 0.61–1.24)
GFR, Estimated: 18 mL/min — ABNORMAL LOW (ref >60)
Glucose, Bld: 217 mg/dL — ABNORMAL HIGH (ref 70–99)
Potassium: 4.2 mmol/L (ref 3.5–5.1)
Sodium: 139 mmol/L (ref 135–145)

## 2024-04-12 LAB — CBC
HCT: 42.9 % (ref 39.0–52.0)
Hemoglobin: 14.8 g/dL (ref 13.0–17.0)
MCH: 29.8 pg (ref 26.0–34.0)
MCHC: 34.5 g/dL (ref 30.0–36.0)
MCV: 86.5 fL (ref 80.0–100.0)
Platelets: 110 K/uL — ABNORMAL LOW (ref 150–400)
RBC: 4.96 MIL/uL (ref 4.22–5.81)
RDW: 13.5 % (ref 11.5–15.5)
WBC: 5.4 K/uL (ref 4.0–10.5)
nRBC: 0 % (ref 0.0–0.2)

## 2024-04-12 LAB — URINALYSIS, ROUTINE W REFLEX MICROSCOPIC
Bilirubin Urine: NEGATIVE
Glucose, UA: 150 mg/dL — AB
Ketones, ur: NEGATIVE mg/dL
Nitrite: NEGATIVE
Protein, ur: 100 mg/dL — AB
RBC / HPF: >50 RBC/hpf (ref 0–5)
Specific Gravity, Urine: 1.014 (ref 1.005–1.030)
WBC, UA: >50 WBC/hpf (ref 0–5)
pH: 5 (ref 5.0–8.0)

## 2024-04-12 MED ORDER — NITROFURANTOIN MONOHYD MACRO 100 MG PO CAPS: 100.0000 mg | ORAL_CAPSULE | Freq: Two times a day (BID) | ORAL | 0 refills | Status: AC

## 2024-04-12 NOTE — ED Provider Notes (Signed)
 [facility] Provider Note    Event Date/Time   First MD Initiated Contact with Patient 04/12/24 1341     (approximate)   History   Hematuria   HPI  Shane Holloway is a 73 y.o. male who presents with complaints of hematuria and brief episode of dysuria yesterday.  Review of medical record demonstrates the patient sees urology for frequent urinary tract infections, has been placed on 250 mg of Keflex  daily as a preventative measure     Physical Exam   Triage Vital Signs: ED Triage Vitals [04/12/24 1304]  Encounter Vitals Group     BP (!) 121/92     Girls Systolic BP Percentile      Girls Diastolic BP Percentile      Boys Systolic BP Percentile      Boys Diastolic BP Percentile      Pulse Rate 60     Resp 16     Temp 98.4 F (36.9 C)     Temp Source Oral     SpO2 96 %     Weight 90.7 kg (200 lb)     Height 1.88 m (6' 2)     Head Circumference      Peak Flow      Pain Score 0     Pain Loc      Pain Education      Exclude from Growth Chart     Most recent vital signs: Vitals:   04/12/24 1304  BP: (!) 121/92  Pulse: 60  Resp: 16  Temp: 98.4 F (36.9 C)  SpO2: 96%     General: Awake, no distress.  CV:  Good peripheral perfusion.  Resp:  Normal effort.  Abd:  No distention.  No suprapubic tenderness Other:  No CVA tenderness   ED Results / Procedures / Treatments   Labs (all labs ordered are listed, but only abnormal results are displayed) Labs Reviewed  URINALYSIS, ROUTINE W REFLEX MICROSCOPIC - Abnormal; Notable for the following components:      Result Value   Color, Urine YELLOW (*)    APPearance CLOUDY (*)    Glucose, UA 150 (*)    Hgb urine dipstick LARGE (*)    Protein, ur 100 (*)    Leukocytes,Ua MODERATE (*)    Bacteria, UA MANY (*)    All other components within normal limits  BASIC METABOLIC PANEL WITH GFR - Abnormal; Notable for the following components:   Glucose, Bld 217 (*)    BUN 56 (*)     Creatinine, Ser 3.38 (*)    GFR, Estimated 18 (*)    All other components within normal limits  CBC - Abnormal; Notable for the following components:   Platelets 110 (*)    All other components within normal limits  URINE CULTURE     EKG     RADIOLOGY     PROCEDURES:  Critical Care performed:   Procedures   MEDICATIONS ORDERED IN ED: Medications - No data to display   IMPRESSION / MDM / ASSESSMENT AND PLAN / ED COURSE  I reviewed the triage vital signs and the nursing notes. Patient's presentation is most consistent with acute complicated illness / injury requiring diagnostic workup.  Patient presents with symptoms as detailed above, overall he is well-appearing, he denies any flank pain or abdominal pain.  Differential includes hematuria NOS, UTI  Urinalysis pending, lab work not significant change from prior, he does have a history of chronic kidney  disease.  Urinalysis is consistent with urinary tract infection, will add on culture, will treat with Macrobid, close outpatient follow-up recommended, return precautions discussed        FINAL CLINICAL IMPRESSION(S) / ED DIAGNOSES   Final diagnoses:  Lower urinary tract infectious disease     Rx / DC Orders   ED Discharge Orders          Ordered    nitrofurantoin, macrocrystal-monohydrate, (MACROBID) 100 MG capsule  2 times daily        04/12/24 1451             Note:  This document was prepared using Dragon voice recognition software and may include unintentional dictation errors.   Arlander Charleston, MD 04/12/24 1504

## 2024-04-12 NOTE — ED Triage Notes (Signed)
 Pt in via POV, reports noticing blood in urine yesterday, also endorses some dyuria that has since resolved.  Vitals WDL, NAD noted at this time?

## 2024-04-16 LAB — URINE CULTURE: Culture: 80000 — AB

## 2024-04-17 NOTE — Progress Notes (Addendum)
 ED Antimicrobial Stewardship Positive Culture Follow Up   Shane Holloway is an 72 y.o. male who presented to [facility] on 04/12/2024 with a chief complaint of  Chief Complaint  Patient presents with   Hematuria    Recent Results (from the past 720 hours)  Urine Culture     Status: Abnormal   Collection Time: 04/12/24  2:29 PM   Specimen: Urine, Clean Catch  Result Value Ref Range Status   Specimen Description   Final    URINE, CLEAN CATCH Performed at [facility], [address] [place], [place] [address]    Special Requests   Final    NONE Performed at [facility], [address] [place], [place] [address]    Culture (A)  Final    80,000 COLONIES/mL KLEBSIELLA PNEUMONIAE Confirmed Extended Spectrum Beta-Lactamase Producer (ESBL).  In bloodstream infections from ESBL organisms, carbapenems are preferred over piperacillin/tazobactam. They are shown to have a lower risk of mortality.    Report Status 04/16/2024 FINAL  Final   Organism ID, Bacteria KLEBSIELLA PNEUMONIAE (A)  Final      Susceptibility   Klebsiella pneumoniae - MIC*    AMPICILLIN >=32 RESISTANT Resistant     CEFAZOLIN (URINE) Value in next row Resistant      >=32 RESISTANTThis is a modified FDA-approved test that has been validated and its performance characteristics determined by the reporting laboratory.  This laboratory is certified under the Clinical Laboratory Improvement Amendments CLIA as qualified to perform high complexity clinical laboratory testing.    CEFEPIME Value in next row Sensitive      >=32 RESISTANTThis is a modified FDA-approved test that has been validated and its performance characteristics determined by the reporting laboratory.  This laboratory is certified under the Clinical Laboratory Improvement Amendments CLIA as qualified to perform high complexity clinical laboratory testing.    ERTAPENEM Value in next row Intermediate      >=32 RESISTANTThis is a modified  FDA-approved test that has been validated and its performance characteristics determined by the reporting laboratory.  This laboratory is certified under the Clinical Laboratory Improvement Amendments CLIA as qualified to perform high complexity clinical laboratory testing.    CEFTRIAXONE Value in next row Sensitive      >=32 RESISTANTThis is a modified FDA-approved test that has been validated and its performance characteristics determined by the reporting laboratory.  This laboratory is certified under the Clinical Laboratory Improvement Amendments CLIA as qualified to perform high complexity clinical laboratory testing.    CIPROFLOXACIN  Value in next row Sensitive      >=32 RESISTANTThis is a modified FDA-approved test that has been validated and its performance characteristics determined by the reporting laboratory.  This laboratory is certified under the Clinical Laboratory Improvement Amendments CLIA as qualified to perform high complexity clinical laboratory testing.    GENTAMICIN Value in next row Sensitive      >=32 RESISTANTThis is a modified FDA-approved test that has been validated and its performance characteristics determined by the reporting laboratory.  This laboratory is certified under the Clinical Laboratory Improvement Amendments CLIA as qualified to perform high complexity clinical laboratory testing.    IMIPENEM Value in next row Sensitive      >=32 RESISTANTThis is a modified FDA-approved test that has been validated and its performance characteristics determined by the reporting laboratory.  This laboratory is certified under the Clinical Laboratory Improvement Amendments CLIA as qualified to perform high complexity clinical laboratory testing.    NITROFURANTOIN Value  in next row Resistant      >=32 RESISTANTThis is a modified FDA-approved test that has been validated and its performance characteristics determined by the reporting laboratory.  This laboratory is certified under the  Clinical Laboratory Improvement Amendments CLIA as qualified to perform high complexity clinical laboratory testing.    TRIMETH/SULFA Value in next row Sensitive      >=32 RESISTANTThis is a modified FDA-approved test that has been validated and its performance characteristics determined by the reporting laboratory.  This laboratory is certified under the Clinical Laboratory Improvement Amendments CLIA as qualified to perform high complexity clinical laboratory testing.    AMPICILLIN/SULBACTAM Value in next row Resistant      >=32 RESISTANTThis is a modified FDA-approved test that has been validated and its performance characteristics determined by the reporting laboratory.  This laboratory is certified under the Clinical Laboratory Improvement Amendments CLIA as qualified to perform high complexity clinical laboratory testing.    PIP/TAZO Value in next row Sensitive      8 SENSITIVEThis is a modified FDA-approved test that has been validated and its performance characteristics determined by the reporting laboratory.  This laboratory is certified under the Clinical Laboratory Improvement Amendments CLIA as qualified to perform high complexity clinical laboratory testing.    MEROPENEM Value in next row Sensitive      8 SENSITIVEThis is a modified FDA-approved test that has been validated and its performance characteristics determined by the reporting laboratory.  This laboratory is certified under the Clinical Laboratory Improvement Amendments CLIA as qualified to perform high complexity clinical laboratory testing.    AMIKACIN Value in next row Sensitive      8 SENSITIVEThis is a modified FDA-approved test that has been validated and its performance characteristics determined by the reporting laboratory.  This laboratory is certified under the Clinical Laboratory Improvement Amendments CLIA as qualified to perform high complexity clinical laboratory testing.    CEFTAZIDIME/AVIBACTAM Value in next row  Sensitive      8 SENSITIVEThis is a modified FDA-approved test that has been validated and its performance characteristics determined by the reporting laboratory.  This laboratory is certified under the Clinical Laboratory Improvement Amendments CLIA as qualified to perform high complexity clinical laboratory testing.    CEFTOLOZANE/TAZOBACTAM Value in next row Sensitive      8 SENSITIVEThis is a modified FDA-approved test that has been validated and its performance characteristics determined by the reporting laboratory.  This laboratory is certified under the Clinical Laboratory Improvement Amendments CLIA as qualified to perform high complexity clinical laboratory testing.    MEROPENEM/VABORBACTAM Value in next row Sensitive ug/mL     8 SENSITIVEThis is a modified FDA-approved test that has been validated and its performance characteristics determined by the reporting laboratory.  This laboratory is certified under the Clinical Laboratory Improvement Amendments CLIA as qualified to perform high complexity clinical laboratory testing.    TOBRAMYCIN Value in next row Sensitive      8 SENSITIVEThis is a modified FDA-approved test that has been validated and its performance characteristics determined by the reporting laboratory.  This laboratory is certified under the Clinical Laboratory Improvement Amendments CLIA as qualified to perform high complexity clinical laboratory testing.    * 80,000 COLONIES/mL KLEBSIELLA PNEUMONIAE    [x]  Treated with nitrofurantoin, organism resistant to prescribed antimicrobial  Hx recurrent Klebsiella pneumoniae UTIs -see ID office visit note 03/11/24  New antibiotic prescription: ciprofloxacin  500mg  po q24h x 5 days   (Scr 3.38,  Crcl 22 ml/min)  ED  Provider: FABIENE Price  04/17/24  called pt phone number in chart [phone] and left voicemail  04/17/24  2nd attempt- called pt.  Spoke with niece who helps pt. Preferred pharmacy is [facility]  [phone]   (pts  regular pharmacy- [facility] is closed till Monday 11/10). Called in above Rx.  Pts niece said they have ID office visit appt at [facility] for Wed. Explained to stop nitrofurantoin and to space out ciprofloxacin  2 hours apart from dairy and medications/food with calcium , iron  or magnesium.   Allean Haas PharmD Clinical Pharmacist 04/17/2024
